# Patient Record
Sex: Male | Born: 1940
Health system: Southern US, Community
[De-identification: ages and names within clinical notes are randomized; demographics above are authoritative.]

## PROBLEM LIST (undated history)

## (undated) DIAGNOSIS — G459 Transient cerebral ischemic attack, unspecified: Secondary | ICD-10-CM

## (undated) DIAGNOSIS — I639 Cerebral infarction, unspecified: Secondary | ICD-10-CM

## (undated) DIAGNOSIS — I4891 Unspecified atrial fibrillation: Secondary | ICD-10-CM

## (undated) DIAGNOSIS — M1990 Unspecified osteoarthritis, unspecified site: Secondary | ICD-10-CM

## (undated) DIAGNOSIS — I10 Essential (primary) hypertension: Secondary | ICD-10-CM

## (undated) DIAGNOSIS — I509 Heart failure, unspecified: Secondary | ICD-10-CM

## (undated) DIAGNOSIS — H547 Unspecified visual loss: Secondary | ICD-10-CM

## (undated) DIAGNOSIS — R7881 Bacteremia: Secondary | ICD-10-CM

## (undated) DIAGNOSIS — I6529 Occlusion and stenosis of unspecified carotid artery: Secondary | ICD-10-CM

## (undated) DIAGNOSIS — A419 Sepsis, unspecified organism: Secondary | ICD-10-CM

## (undated) DIAGNOSIS — N179 Acute kidney failure, unspecified: Secondary | ICD-10-CM

## (undated) DIAGNOSIS — E785 Hyperlipidemia, unspecified: Secondary | ICD-10-CM

## (undated) DIAGNOSIS — R3915 Urgency of urination: Secondary | ICD-10-CM

## (undated) DIAGNOSIS — E119 Type 2 diabetes mellitus without complications: Secondary | ICD-10-CM

## (undated) DIAGNOSIS — H269 Unspecified cataract: Secondary | ICD-10-CM

## (undated) DIAGNOSIS — F419 Anxiety disorder, unspecified: Secondary | ICD-10-CM

## (undated) DIAGNOSIS — T7840XA Allergy, unspecified, initial encounter: Secondary | ICD-10-CM

## (undated) HISTORY — DX: Occlusion and stenosis of unspecified carotid artery: I65.29

## (undated) HISTORY — DX: Cerebral infarction, unspecified: I63.9

## (undated) HISTORY — DX: Bacteremia: R78.81

## (undated) HISTORY — PX: AORTIC VALVE REPLACEMENT: SHX41

## (undated) HISTORY — DX: Essential (primary) hypertension: I10

## (undated) HISTORY — DX: Transient cerebral ischemic attack, unspecified: G45.9

## (undated) HISTORY — DX: Allergy, unspecified, initial encounter: T78.40XA

## (undated) HISTORY — DX: Unspecified visual loss: H54.7

## (undated) HISTORY — DX: Type 2 diabetes mellitus without complications: E11.9

## (undated) HISTORY — DX: Acute kidney failure, unspecified: N17.9

## (undated) HISTORY — DX: Sepsis, unspecified organism: A41.9

## (undated) HISTORY — DX: Heart failure, unspecified: I50.9

## (undated) HISTORY — DX: Unspecified cataract: H26.9

## (undated) HISTORY — DX: Hyperlipidemia, unspecified: E78.5

## (undated) HISTORY — DX: Unspecified atrial fibrillation: I48.91

## (undated) HISTORY — DX: Anxiety disorder, unspecified: F41.9

## (undated) HISTORY — PX: BACK SURGERY: SHX140

## (undated) HISTORY — PX: EYE SURGERY: SHX253

---

## 2008-07-20 HISTORY — PX: CARDIAC VALVE REPLACEMENT: SHX585

## 2008-10-26 ENCOUNTER — Inpatient Hospital Stay (HOSPITAL_COMMUNITY): Admission: EM | Admit: 2008-10-26 | Discharge: 2008-11-05 | Payer: Self-pay | Admitting: Emergency Medicine

## 2008-10-26 ENCOUNTER — Encounter (INDEPENDENT_AMBULATORY_CARE_PROVIDER_SITE_OTHER): Payer: Self-pay | Admitting: Interventional Cardiology

## 2008-10-26 ENCOUNTER — Ambulatory Visit: Payer: Self-pay | Admitting: Surgery

## 2008-11-01 ENCOUNTER — Encounter: Payer: Self-pay | Admitting: Surgery

## 2008-11-01 HISTORY — PX: CARDIAC VALVE REPLACEMENT: SHX585

## 2008-11-27 ENCOUNTER — Ambulatory Visit: Payer: Self-pay | Admitting: Surgery

## 2008-11-27 ENCOUNTER — Encounter: Admission: RE | Admit: 2008-11-27 | Discharge: 2008-11-27 | Payer: Self-pay | Admitting: Surgery

## 2010-10-04 ENCOUNTER — Encounter: Payer: Self-pay | Admitting: Nurse Practitioner

## 2010-10-04 DIAGNOSIS — E119 Type 2 diabetes mellitus without complications: Secondary | ICD-10-CM

## 2010-10-04 DIAGNOSIS — I2510 Atherosclerotic heart disease of native coronary artery without angina pectoris: Secondary | ICD-10-CM

## 2010-10-29 LAB — BASIC METABOLIC PANEL
BUN: 25 mg/dL — ABNORMAL HIGH (ref 6–23)
BUN: 18 mg/dL (ref 6–23)
CO2: 28 mEq/L (ref 19–32)
CO2: 29 mEq/L (ref 19–32)
CO2: 30 mEq/L (ref 19–32)
Calcium: 9.3 mg/dL (ref 8.4–10.5)
Calcium: 9.3 mg/dL (ref 8.4–10.5)
Chloride: 102 mEq/L (ref 96–112)
Chloride: 106 mEq/L (ref 96–112)
Chloride: 106 mEq/L (ref 96–112)
Chloride: 102 mEq/L (ref 96–112)
Chloride: 98 mEq/L (ref 96–112)
Chloride: 99 mEq/L (ref 96–112)
Creatinine, Ser: 1.04 mg/dL (ref 0.4–1.5)
Creatinine, Ser: 0.92 mg/dL (ref 0.4–1.5)
Creatinine, Ser: 0.98 mg/dL (ref 0.4–1.5)
Creatinine, Ser: 1.01 mg/dL (ref 0.4–1.5)
GFR calc Af Amer: >60 mL/min (ref >60)
GFR calc Af Amer: >60 mL/min (ref >60)
GFR calc Af Amer: >60 mL/min (ref >60)
GFR calc Af Amer: >60 mL/min (ref >60)
GFR calc non Af Amer: >60 mL/min (ref >60)
GFR calc non Af Amer: >60 mL/min (ref >60)
GFR calc non Af Amer: >60 mL/min (ref >60)
Glucose, Bld: 123 mg/dL — ABNORMAL HIGH (ref 70–99)
Glucose, Bld: 137 mg/dL — ABNORMAL HIGH (ref 70–99)
Potassium: 4.6 mEq/L (ref 3.5–5.1)
Potassium: 4.6 mEq/L (ref 3.5–5.1)
Sodium: 135 mEq/L (ref 135–145)
Sodium: 140 mEq/L (ref 135–145)
Sodium: 137 mEq/L (ref 135–145)

## 2010-10-29 LAB — CBC
HCT: 29.5 % — ABNORMAL LOW (ref 39.0–52.0)
HCT: 29.6 % — ABNORMAL LOW (ref 39.0–52.0)
HCT: 34 % — ABNORMAL LOW (ref 39.0–52.0)
HCT: 37.3 % — ABNORMAL LOW (ref 39.0–52.0)
Hemoglobin: 11 g/dL — ABNORMAL LOW (ref 13.0–17.0)
Hemoglobin: 13.1 g/dL (ref 13.0–17.0)
Hemoglobin: 13.3 g/dL (ref 13.0–17.0)
MCHC: 31.5 g/dL (ref 30.0–36.0)
MCHC: 32.3 g/dL (ref 30.0–36.0)
MCHC: 32.9 g/dL (ref 30.0–36.0)
MCV: 74.8 fL — ABNORMAL LOW (ref 78.0–100.0)
MCV: 75.1 fL — ABNORMAL LOW (ref 78.0–100.0)
MCV: 75.6 fL — ABNORMAL LOW (ref 78.0–100.0)
MCV: 75.1 fL — ABNORMAL LOW (ref 78.0–100.0)
MCV: 75.8 fL — ABNORMAL LOW (ref 78.0–100.0)
Platelets: 130 10*3/uL — ABNORMAL LOW (ref 150–400)
Platelets: 134 10*3/uL — ABNORMAL LOW (ref 150–400)
Platelets: 139 10*3/uL — ABNORMAL LOW (ref 150–400)
Platelets: 198 10*3/uL (ref 150–400)
RBC: 3.87 MIL/uL — ABNORMAL LOW (ref 4.22–5.81)
RBC: 3.92 MIL/uL — ABNORMAL LOW (ref 4.22–5.81)
RBC: 3.96 MIL/uL — ABNORMAL LOW (ref 4.22–5.81)
RBC: 4.5 MIL/uL (ref 4.22–5.81)
RBC: 4.92 MIL/uL (ref 4.22–5.81)
RBC: 5.35 MIL/uL (ref 4.22–5.81)
RBC: 5.49 MIL/uL (ref 4.22–5.81)
WBC: 10.4 10*3/uL (ref 4.0–10.5)
WBC: 8.3 10*3/uL (ref 4.0–10.5)
WBC: 9.4 10*3/uL (ref 4.0–10.5)
WBC: 10.4 10*3/uL (ref 4.0–10.5)
WBC: 12.7 10*3/uL — ABNORMAL HIGH (ref 4.0–10.5)
WBC: 14.1 10*3/uL — ABNORMAL HIGH (ref 4.0–10.5)
WBC: 8 10*3/uL (ref 4.0–10.5)
WBC: 9.9 10*3/uL (ref 4.0–10.5)

## 2010-10-29 LAB — URINALYSIS, ROUTINE W REFLEX MICROSCOPIC
Glucose, UA: NEGATIVE mg/dL
Hgb urine dipstick: NEGATIVE
Ketones, ur: 15 mg/dL — AB
Nitrite: NEGATIVE
Specific Gravity, Urine: 1.028 (ref 1.005–1.030)
pH: 6 (ref 5.0–8.0)

## 2010-10-29 LAB — COMPREHENSIVE METABOLIC PANEL
AST: 24 U/L (ref 0–37)
AST: 51 U/L — ABNORMAL HIGH (ref 0–37)
Albumin: 2.8 g/dL — ABNORMAL LOW (ref 3.5–5.2)
Albumin: 4 g/dL (ref 3.5–5.2)
Alkaline Phosphatase: 69 U/L (ref 39–117)
Alkaline Phosphatase: 76 U/L (ref 39–117)
BUN: 20 mg/dL (ref 6–23)
Chloride: 102 mEq/L (ref 96–112)
Chloride: 105 mEq/L (ref 96–112)
Creatinine, Ser: 0.92 mg/dL (ref 0.4–1.5)
GFR calc Af Amer: >60 mL/min (ref >60)
GFR calc Af Amer: >60 mL/min (ref >60)
Potassium: 3.5 mEq/L (ref 3.5–5.1)
Potassium: 4.2 mEq/L (ref 3.5–5.1)
Sodium: 139 mEq/L (ref 135–145)
Total Bilirubin: 1.3 mg/dL — ABNORMAL HIGH (ref 0.3–1.2)
Total Protein: 5.9 g/dL — ABNORMAL LOW (ref 6.0–8.3)

## 2010-10-29 LAB — GLUCOSE, CAPILLARY
Glucose-Capillary: 125 mg/dL — ABNORMAL HIGH (ref 70–99)
Glucose-Capillary: 143 mg/dL — ABNORMAL HIGH (ref 70–99)
Glucose-Capillary: 229 mg/dL — ABNORMAL HIGH (ref 70–99)
Glucose-Capillary: 130 mg/dL — ABNORMAL HIGH (ref 70–99)
Glucose-Capillary: 149 mg/dL — ABNORMAL HIGH (ref 70–99)
Glucose-Capillary: 202 mg/dL — ABNORMAL HIGH (ref 70–99)
Glucose-Capillary: 94 mg/dL (ref 70–99)

## 2010-10-29 LAB — POCT I-STAT 3, ART BLOOD GAS (G3+)
Bicarbonate: 22.9 mEq/L (ref 20.0–24.0)
Bicarbonate: 27.8 mEq/L — ABNORMAL HIGH (ref 20.0–24.0)
O2 Saturation: 95 %
O2 Saturation: 99 %
TCO2: 24 mmol/L (ref 0–100)
TCO2: 24 mmol/L (ref 0–100)
TCO2: 26 mmol/L (ref 0–100)
TCO2: 29 mmol/L (ref 0–100)
pCO2 arterial: 35.3 mmHg (ref 35.0–45.0)
pCO2 arterial: 36 mmHg (ref 35.0–45.0)
pCO2 arterial: 37.5 mmHg (ref 35.0–45.0)
pH, Arterial: 7.443 (ref 7.350–7.450)
pH, Arterial: 7.456 — ABNORMAL HIGH (ref 7.350–7.450)
pH, Arterial: 7.504 — ABNORMAL HIGH (ref 7.350–7.450)
pO2, Arterial: 137 mmHg — ABNORMAL HIGH (ref 80.0–100.0)

## 2010-10-29 LAB — CARDIAC PANEL(CRET KIN+CKTOT+MB+TROPI)
CK, MB: 1.2 ng/mL (ref 0.3–4.0)
CK, MB: 1.8 ng/mL (ref 0.3–4.0)
Relative Index: INVALID (ref 0.0–2.5)
Total CK: 104 U/L (ref 7–232)
Total CK: 51 U/L (ref 7–232)
Troponin I: 0.01 ng/mL (ref 0.00–0.06)

## 2010-10-29 LAB — PROTIME-INR: Prothrombin Time: 18.6 seconds — ABNORMAL HIGH (ref 11.6–15.2)

## 2010-10-29 LAB — TYPE AND SCREEN
Antibody Screen: POSITIVE
Donor AG Type: NEGATIVE

## 2010-10-29 LAB — POCT I-STAT 4, (NA,K, GLUC, HGB,HCT)
Glucose, Bld: 127 mg/dL — ABNORMAL HIGH (ref 70–99)
Glucose, Bld: 150 mg/dL — ABNORMAL HIGH (ref 70–99)
HCT: 26 % — ABNORMAL LOW (ref 39.0–52.0)
HCT: 26 % — ABNORMAL LOW (ref 39.0–52.0)
Hemoglobin: 13.9 g/dL (ref 13.0–17.0)
Hemoglobin: 8.8 g/dL — ABNORMAL LOW (ref 13.0–17.0)
Hemoglobin: 8.8 g/dL — ABNORMAL LOW (ref 13.0–17.0)
Potassium: 3.6 mEq/L (ref 3.5–5.1)
Potassium: 3.7 mEq/L (ref 3.5–5.1)

## 2010-10-29 LAB — CK TOTAL AND CKMB (NOT AT ARMC): Relative Index: INVALID (ref 0.0–2.5)

## 2010-10-29 LAB — APTT: aPTT: 38 seconds — ABNORMAL HIGH (ref 24–37)

## 2010-10-29 LAB — DIFFERENTIAL
Basophils Absolute: 0.1 10*3/uL (ref 0.0–0.1)
Eosinophils Relative: 1 % (ref 0–5)
Lymphocytes Relative: 12 % (ref 12–46)
Monocytes Absolute: 0.9 10*3/uL (ref 0.1–1.0)
Monocytes Relative: 9 % (ref 3–12)

## 2010-10-29 LAB — CREATININE, SERUM
GFR calc Af Amer: >60 mL/min (ref >60)
GFR calc non Af Amer: 57 mL/min — ABNORMAL LOW (ref >60)
GFR calc non Af Amer: >60 mL/min (ref >60)

## 2010-10-29 LAB — MAGNESIUM
Magnesium: 2.2 mg/dL (ref 1.5–2.5)
Magnesium: 2.5 mg/dL (ref 1.5–2.5)
Magnesium: 2.5 mg/dL (ref 1.5–2.5)
Magnesium: 2.8 mg/dL — ABNORMAL HIGH (ref 1.5–2.5)

## 2010-10-29 LAB — POCT CARDIAC MARKERS
CKMB, poc: <1 ng/mL — ABNORMAL LOW (ref 1.0–8.0)
Troponin i, poc: <0.05 ng/mL (ref 0.00–0.09)

## 2010-10-29 LAB — URINE MICROSCOPIC-ADD ON

## 2010-10-29 LAB — TSH: TSH: 1.498 u[IU]/mL (ref 0.350–4.500)

## 2010-10-29 LAB — POCT I-STAT, CHEM 8
Calcium, Ion: 1.15 mmol/L (ref 1.12–1.32)
Chloride: 106 mEq/L (ref 96–112)
Glucose, Bld: 104 mg/dL — ABNORMAL HIGH (ref 70–99)
HCT: 28 % — ABNORMAL LOW (ref 39.0–52.0)
HCT: 33 % — ABNORMAL LOW (ref 39.0–52.0)
Hemoglobin: 11.2 g/dL — ABNORMAL LOW (ref 13.0–17.0)
Hemoglobin: 9.5 g/dL — ABNORMAL LOW (ref 13.0–17.0)
Potassium: 3.2 mEq/L — ABNORMAL LOW (ref 3.5–5.1)
Potassium: 5.1 mEq/L (ref 3.5–5.1)

## 2010-10-29 LAB — HEMOGLOBIN AND HEMATOCRIT, BLOOD
HCT: 28.3 % — ABNORMAL LOW (ref 39.0–52.0)
Hemoglobin: 9.5 g/dL — ABNORMAL LOW (ref 13.0–17.0)

## 2010-10-29 LAB — POCT I-STAT GLUCOSE
Glucose, Bld: 140 mg/dL — ABNORMAL HIGH (ref 70–99)
Operator id: 238831

## 2010-10-29 LAB — TROPONIN I: Troponin I: 0.01 ng/mL (ref 0.00–0.06)

## 2010-10-29 LAB — POCT I-STAT 3, VENOUS BLOOD GAS (G3P V)
O2 Saturation: 70 %
TCO2: 26 mmol/L (ref 0–100)

## 2010-10-29 LAB — PLATELET COUNT: Platelets: 151 10*3/uL (ref 150–400)

## 2010-10-29 LAB — BRAIN NATRIURETIC PEPTIDE: Pro B Natriuretic peptide (BNP): 474 pg/mL — ABNORMAL HIGH (ref 0.0–100.0)

## 2010-12-02 NOTE — Op Note (Signed)
NAME:  ARAF, CLUGSTON NO.:  [id number]   MEDICAL RECORD NO.:  [id number]          PATIENT TYPE:  INP   LOCATION:  2305                         FACILITY:  [facility]   PHYSICIAN:  Ala Dach, M.D.DATE OF BIRTH:  04-Aug-1940   DATE OF PROCEDURE:  11/01/2008  DATE OF DISCHARGE:                               OPERATIVE REPORT   INDICATIONS FOR PROCEDURE:  Mr. Remmert is a 70 year old gentleman, a  patient of Dr. Daneen Schick who presents today with aortic stenosis for  repair or replacement by Dr. Gilford Raid.  He is brought to the holding  area under local anesthesia, routine monitors were placed.  He was then  moved to the OR for routine induction of general anesthesia after which,  the TEE probe was protected, lubricated, and passed oropharyngeally into  the stomach, then slightly withdrawn for imaging on the cardiac  structures.   PRECARDIOPULMONARY TEE EXAMINATION:  Left ventricle:  The left  ventricular chamber was seen initially in a short axis view.  There was  mild global left ventricular hypertrophy noted.  There was some mild  diminishment of overall contractile pattern appreciated with some slight  diminishment of the thickening and contractile pattern noted in this  short axis view.  All segmental wall areas, however, are thickening and  moving inward.  Long-axis view is obtained again showing satisfactory  inferior and anterior wall contractility.   Mitral valve:  Mitral valve was initially seen in the four-chamber view.  The anterior and posterior leaflets are well outlined, well visualized,  and are mildly thickened.  They appear to be functioning entirely normal  in appropriate manner.  On color Doppler examination, there was only  trace mitral regurgitant flow appreciated.  Multiple views were carried  out again only trace regurgitant flow was noted.   Left atrium:  This is a normal left atrial chamber in size and shape.  No masses are appreciated  within.   Aortic valve:  The aortic valve was initially seen in a short-axis view.  There are 3 cusps appreciated.  The leaflets were heavily calcified with  severe restriction of movement; however, there is opening some movement  in all 3 cusps.  Attempt at passing the probe for a transgastric view in  order to obtain pressure measurements were only associated with this  mild runs of V-TACH, so we stopped doing this maneuver.  In a left  ventricular outflow view of the aortic valve, we could see again the  diminution of movement of the leaflets themselves.  Color Doppler across  this area revealed a 1+ regurgitant jet appreciated in the left  ventricular outflow tract and a 3+ jet above the level of the stenotic  valve indicating moderate aortic stenosis.  Again multiple views were  obtained.   Right ventricle:  The right ventricular chamber was somewhat enlarged,  but of normal contractility.   Tricuspid valve:  Well visualized, well seen.  Structures were normal  and intact.  Trace tricuspid regurgitant flow appreciated.   Right atrium:  The right atrial chamber was well visualized easily.  No  masses are noted within.   The patient was placed on cardiopulmonary bypass.  Aortotomy was  performed.  The aortic valve was replaced with a pericardial tissue  valve.  De-airing maneuvers were carried out and the patient was  prepared for separation from cardiopulmonary bypass.   POSTCARDIOPULMONARY BYPASS TEE EXAMINATION (LIMITED EXAM):  Left  ventricular chamber is noted in a short-axis view in postbypass.  There  was significant septal wall and inferior wall hypocontractility  appreciated compared to prebypass.  There was satisfactory contractile  pattern noted in the anterior anteroseptal and anterolateral walls.  However, with time and separation of cardiopulmonary bypass, the  inferior wall regain its contractile pattern.  The septal wall improved  in its contractility and  overall I was as previously described in the  prebypass period.   Aortic valve now seen in place and the diseased aortic valve could be  seen and well seated.  Pericardial tissue valve noted the 3 struts, we  tried the perforated valve were well shown.  The leaflet edges are well  seen.  They opened appropriately during systolic ejection and closed  satisfactorily.  Long-axis views again reveal no regurgitant flow  whatsoever and no obstruction to flow during systolic contraction,  appears to be completely satisfactory replacement of the aortic valve.   Rest of the cardiac examination was as previously described and the  patient was returned to the cardiac intensive care unit in stable  condition.           ______________________________  Ala Dach, M.D.     JTM/MEDQ  D:  11/01/2008  T:  11/02/2008  Job:  [id number]

## 2010-12-02 NOTE — Cardiovascular Report (Signed)
NAME:  Evan Stanley, Evan Stanley NO.:  [id number]   MEDICAL RECORD NO.:  [id number]          PATIENT TYPE:  INP   LOCATION:  4496                         FACILITY:  [facility]   PHYSICIAN:  Belva Crome, M.D.   DATE OF BIRTH:  01-Mar-1941   DATE OF PROCEDURE:  10/29/2008  DATE OF DISCHARGE:                            CARDIAC CATHETERIZATION   REASON FOR THE PROCEDURE:  Congestive heart failure, secondary to severe  aortic stenosis.  The study is being done to document pulmonary artery  pressures and to rule out coronary artery disease prior to valve  replacement.   PROCEDURE PERFORMED:  1. Left heart catheterization.  2. Right heart catheterization.  3. Coronary angiography.  4. Left ventriculography by hand injection.   DESCRIPTION:  After informed consent a 6-French sheath was placed in the  right femoral artery using the modified Seldinger technique.  A 6-French  A2 multipurpose catheter was used for left heart catheterization.  Hemodynamic recordings and selective left and right coronary  angiography.  Left coronary angiography was also performed using a #4 6-  [place] and a 3.5 6-French left Judkins catheter.   A Swan-Ganz catheter was used for right heart hemodynamic recordings,  oximetry samples, and thermodilution cardiac output recordings.  An LV  and pulmonary wedge simultaneous recording was performed.  During left  heart catheterization, the multipurpose catheter was used across the  aortic valve with a straight flexible-tip wire.  We were able to cross  into the left ventricle with only minimal difficulty.  Pullback pressure  was recorded with a multipurpose.   Hemostasis was achieved with manual compression.   RESULTS:  1. Hemodynamic data:  1A.  Right atrial mean pressure 1 mmHg.  1B.  Left ventricular pressure 150/40 mmHg.  1C.  Pulmonary artery pressure 50/12 mmHg.  1D.  Pulmonary capillary wedge mean 24 mmHg.  1E.  Aortic pressure 164/80.  70F.  Left  ventricular pressure 212/23 mmHg.  1G.  Aortic valve gradient peak to peak 48 mmHg.  1H.  Cardiac output by Fick 5.6 L/min and thermodilution 5.16 L/min.  1I.  Aortic valve area using the Fick cardiac output 0.9 cm2.  1. Left ventriculography:  Left ventricular cavity size is normal.  LV      function is normal.  EF is 55%.  No mitral regurgitation is noted.  2. Coronary angiography:  3A.  Left main:  Left main was short, but widely patent.  3B.  Left anterior descending coronary artery:  The large vessel that  wraps around the left ventricular apex.  The midvessel contains moderate  obstruction with less than 50% narrowing.  3C.  Circumflex coronary artery:  The circumflex coronary artery is a  large vessel that gives origin to 3 obtuse marginal branches with 50%  midvessel stenosis and 50-70% stenosis in the third obtuse margin.  3D.  Right coronary artery:  There is eccentric 40-50% narrowing in the  midvessel.  The distal vessel gives origin to PDA of the left ventricle  branch.   CONCLUSION:  1. Severe aortic stenosis with an aortic valve area between  0.88 and      1.0 cm2 depending upon the cardiac output used.  It was felt that      the patient has a calcific and possibly bicuspid aortic valve.  2. Moderately severe pulmonary hypertension with pulmonary artery      pressure of 50/12 mmHg.  3. Low normal left ventricular function with an ejection fraction of      50-55%.  4. Mild-to-moderate mid- left anterior descending and mid-to-distal      circumflex.  5. Calcified ascending aorta.   RECOMMENDATIONS:  Consideration for aortic valve replacement and a  possible coronary artery bypass grafting by Dr. Cyndia Bent.      Belva Crome, M.D.  Electronically Signed     HWS/MEDQ  D:  10/29/2008  T:  10/30/2008  Job:  [id number]   cc:   [facility]  Gilford Raid, M.D.

## 2010-12-02 NOTE — Discharge Summary (Signed)
NAME:  KOSEI, RHODES NO.:  [id number]   MEDICAL RECORD NO.:  [id number]          PATIENT TYPE:  INP   LOCATION:  2024                         FACILITY:  [facility]   PHYSICIAN:  Gilford Raid, M.D.     DATE OF BIRTH:  June 26, 1941   DATE OF ADMISSION:  10/26/2008  DATE OF DISCHARGE:  11/05/2008                               DISCHARGE SUMMARY   HISTORY:  The patient is a 70 year old gentleman with a history of  uncontrolled hypertension as well as a long history of a heart murmur  dating back to the 1960s, which was noted on physical exam when he was  drafted for the [place] [employer].  He was told he could not be drafted due to  the heart murmur.  This was never followed up.  He now reports about a 1-  year history of some exertional shortness of breath as well as fatigue.  Over the past month, this somewhat has progressed and he has developed  some orthopnea as well as PND and worsening of the exertional dyspnea  and fatigue.  He has also noticed some edema in his legs and some  abdominal fullness.  He said recently he has played golf daily for  approximately 7 days and has also been able to mow his grass on a riding  lawn mower.  He was pushing a limb out of the way and noticed some pain  in his left chest, which he felt was probably due to a pulled muscle.  He presented to his primary care physician because the pain persisted  and he was noted to have a systolic blood pressure of 190, and he was  sent to the emergency department.  His cardiac enzymes were negative.  His BNP was 474.  Electrocardiogram reveals some T-wave inversions in  the inferolateral leads and questionable J-point elevation in the  anterior leads.  This was a poor quality EKG.  An echocardiogram showed  severe aortic stenosis with an aortic valve area of 1.06 cm2 and a mean  gradient of 23 with a peak gradient of 42.  Left ventricular function  was normal with an ejection fraction of 60%.  There was  markedly  increased left ventricular wall thickness with findings consistent with  severe diastolic dysfunction.  There was also moderate aortic  insufficiency and mild mitral regurgitation.  Right ventricular function  was normal.  He was admitted through Dr. Thompson Caul service for further  evaluation and treatment to include cardiac catheterization.  He  responded to an initial course of diuresis.  On October 29, 2008, he was  taken to the Cardiac Catheterization Lab where he was found to have  normal left ventricular function with an elevated left ventricular end-  diastolic pressure.  There was severe aortic stenosis.  There was  moderate mid and distal circumflex coronary artery disease and mild-to-  moderate mid LAD coronary artery disease.  There was moderate pulmonary  hypertension.  For full details, please see the catheterization report.  Due to these findings, surgical consultation was obtained with Gilford Raid, MD, who evaluated  the patient and studies agreed with  recommendations to proceed with aortic valve replacement.   ALLERGIES:  None.   PAST MEDICAL HISTORY:  Uncontrolled hypertension.   PAST SURGICAL HISTORY:  Back surgery in 1980s.  He is also status post  right carpal tunnel surgery.   MEDICATIONS:  Prior to admission none.   SOCIAL HISTORY:  He is retired.  He lives in [place] with his wife.  He  is a remote smoker.  He denies alcohol use.   FAMILY HISTORY:  His mother died of coronary artery disease at age 26.  Father died of a stroke at age 32.  He has a brother who had aortic  valve replacement in the past for aortic stenosis.   PHYSICAL EXAMINATION:  Please see the history and physical done at the  time of admission.   PROCEDURE:  On November 01, 2008, he was taken to the operating room where  he underwent the following procedure, aortic valve replacement with a 21-  mm [employer] Ease tissue valve.  He tolerated the procedure well and  was taken to the  surgical intensive care unit in stable condition.   POSTOPERATIVE HOSPITAL COURSE:  The patient has progressed nicely.  He  has remained neurologically intact.  He has remained hemodynamically  stable.  All routine labs, monitors, drainage devices were discontinued  in the standard fashion.  He has had some moderate volume overload and  we require further diuresis as an outpatient for a short term.  He has  maintained sinus rhythm, although he did have 1 episode of a  supraventricular tachycardia at the time of this dictation.  His beta-  blocker was adjusted.  He has an acute blood loss anemia with most  recent hemoglobin/hematocrit dated on November 03, 2008, at 9.5 and 29  respectively.  Electrolytes, BUN, and creatinine are within normal  limits.  He has been started on a low-dose ACE inhibitor for his chronic  hypertension.  His blood pressure is under adequate control.  His oxygen  has been weaned and he maintains good saturations on room air.  His  incision is healing well without signs of infection.  His overall status  is felt to be tentatively stable for discharge in the morning of November 05, 2008, pending morning round reevaluation.   MEDICATIONS ON DISCHARGE:  At the time of this dictation are as follows;  1. Aspirin 325 mg daily.  2. Lopressor 25 mg twice daily.  3. Lasix 40 mg daily for 7 days.  4. Potassium chloride 20 mEq daily for 7 days.  5. Oxycodone 5 mg 1 to 2 every 4-6 hours as needed for pain.  6. Lisinopril 10 mg daily.  7. He is also to resume his vitamin E, vitamin C, and vitamin B6      supplements as previously taken at home.   CONDITION ON DISCHARGE:  Stable, improving.   INSTRUCTIONS:  Instructions on medications, activity, diet, wound care,  and followup.   FOLLOWUP:  Dr. Daneen Schick in 2 weeks.  He is instructed to the doctor's  office to arrange.  Additionally, he will see Dr. Cyndia Bent in 3 weeks.  Our office will give him a call.  He will have a chest  x-ray done at  that time.   FINAL DIAGNOSIS:  Severe aortic valve stenosis status post aortic valve  replacement as described above.   OTHER DIAGNOSES:  Hypertension, diastolic dysfunction admission,  congestive heart failure, postoperative acute blood loss  anemia, and  postoperative transient supraventricular tachycardia.       John Giovanni, P.A.-C.      Gilford Raid, M.D.  Electronically Signed    WEG/MEDQ  D:  11/04/2008  T:  11/05/2008  Job:  [id number]   cc:   Gilford Raid, M.D.  Belva Crome, M.D.

## 2010-12-02 NOTE — Assessment & Plan Note (Signed)
OFFICE VISIT   Evan Stanley, Evan Stanley  DOB:  Jul 30, 1940                                        Nov 27, 2008  CHART #:  [id number]   The patient returns today for followup status post aortic valve  replacement with a 21-mm Edwards pericardial valve on November 01, 2008,  for severe aortic stenosis.  He has been walking daily without any chest  pain or shortness of breath.  He said his energy level was already much  better than it was preoperatively.  He did see Dr. [name] in the office  recently and his blood pressure medications were adjusted.   PHYSICAL EXAMINATION:  VITAL SIGNS:  Today, his blood pressure is 170/76  and his pulse is 58 and regular.  Respiratory rate is 18 and unlabored.  Oxygen saturation on room air is 99%.  GENERAL:  He looks well.  CARDIAC:  Regular rate and rhythm with normal valve sounds.  There is a  soft systolic flow murmur across the aortic valve prosthesis.  There is  no diastolic murmur.  LUNGS:  Clear.  CHEST:  Incision is healing well and sternum is stable.  EXTREMITIES:  There is no peripheral edema.   Followup chest x-ray today shows clear lung fields and no pleural  effusions.   IMPRESSION:  Overall, the patient is recovering well following his  surgery.  He does have hypertension which is being managed by Dr. [name].  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  He will continue to follow up with Dr. [name] and  will contact me if he develops any problems with his incision.   Gilford Raid, M.D.  Electronically Signed   BB/MEDQ  D:  11/27/2008  T:  11/28/2008  Job:  [id number]   cc:   Belva Crome, M.D.

## 2010-12-02 NOTE — Op Note (Signed)
NAME:  MERIT, MAYBEE                ACCOUNT NO.:  [id number]   MEDICAL RECORD NO.:  [id number]          PATIENT TYPE:  INP   LOCATION:  2305                         FACILITY:  [facility]   PHYSICIAN:  Gilford Raid, M.D.     DATE OF BIRTH:  1940-09-03   DATE OF PROCEDURE:  11/01/2008  DATE OF DISCHARGE:                               OPERATIVE REPORT   PREOPERATIVE DIAGNOSIS:  Severe aortic stenosis.   POSTOPERATIVE DIAGNOSIS:  Severe aortic stenosis.   OPERATIVE PROCEDURE:  Aortic valve replacement using a 21-mm Edwards  Pericardial Magna - Ease valve.   SURGEON:  Gilford Raid, MD   ASSISTANT:  Suzzanne Cloud, PA-C   ANESTHESIA:  General endotracheal.   CLINICAL HISTORY:  This patient is a 70 year old gentleman with history  of uncontrolled hypertension as well as a long history of heart murmur  who presents with a 1-year history of exertional dyspnea and fatigue,  improved with rest.  The has progressed to the point where he has PND  and orthopnea as well as lower extremity edema and abdominal fullness  over the past month.  He presented to his primary physician after  developing some left-sided chest pain while mowing his yard.  He felt  this was most likely a pulled muscle.  His systolic blood pressure was  190 when he saw his primary physician and he was sent to the emergency  room.  Electrocardiogram showed no acute changes.  His cardiac enzymes  were negative.  The BNP was 474.  He underwent an echocardiogram which  showed the aortic valve area of 1.06 cm2 with a peak gradient of 42 and  a mean gradient of 23.  Ejection fraction was 60% with markedly  increased left ventricular wall thickness.  There was a finding of  severe diastolic dysfunction.  There was mild-to-moderate aortic  insufficiency and mild mitral regurgitation.  The right ventricle was  normal.  He subsequently underwent cardiac catheterization on October 29, 2008, which showed mild-to-moderate coronary artery  disease without  significant stenosis.  There was some pulmonary hypertension with PA  pressure of 50/12 and a wedge pressure of 24.  His peak-to-peak aortic  valve gradient was measured at 46 with an valve area of 0.83 cm2.  Ejection fraction was 50-60%.  After review of the catheterization and  echocardiogram, it was felt that aortic valve replacement would be the  best treatment.  His coronary stenoses were not significant and does not  require bypass.  I discussed the operative procedure of aortic valve  replacement with him including choices for valve prosthesis.  We  discussed pros and cons of mechanical versus tissue valves.  He was  adamant and he did not want to be on Coumadin and wanted tissue valve.  Given his age of 80 years, I felt this was a reasonable choice.  I  discussed the risk of structural valve deterioration in the future  possibly requiring further intervention.  We also discussed the benefits  and risks of surgery including but not limited to bleeding, blood  transfusion, infection, stroke, myocardial infarction, organ  failure,  and death.  He understood and agreed to proceed.   OPERATIVE PROCEDURE:  The patient was taken operating room and placed on  table in the supine position.  After induction of general endotracheal  anesthesia, a Foley catheter was placed in bladder using sterile  technique.  Preoperative intravenous antibiotics were given.  Then, the  chest, abdomen, and both lower extremities were prepped and draped in  usual sterile manner.  Transesophageal echocardiogram was performed by  Dr. Rica Koyanagi and showed severe calcific aortic stenosis.  This was  a trileaflet valve.  There was mild aortic insufficiency.  There was  mild mitral regurgitation.  There was mild-to-moderate left ventricular  hypertrophy which did not look as severe as it did on the transthoracic  echocardiogram.  Left ventricular function appeared well preserved.  He  did have  pulmonary hypertension with PAD in the low 20s.   Then, the chest opened through a median sternotomy incision.  The  pericardium was opened in midline.  Examination of the heart showed good  ventricular contractility.  The ascending aorta was of normal size and  had no palpable plaques in it.   Then, the patient was heparinized.  When an adequate activated clotting  time was achieved, the distal ascending aorta was cannulated using a 20-  [place] aortic cannula for arterial inflow.  Venous outflow was achieved  using a 2-stage venous cannula through the right atrial appendage.  An  antegrade cardioplegia and vent cannula were inserted in the aortic  root.   The patient was placed on cardiopulmonary bypass and distal coronaries  identified.  There were some segmental plaques in the coronary arteries.  Then, a left ventricular vent was placed thorough right superior  pulmonary vein.  A retrograde cardioplegia cannula was placed through  the right atrium in the coronary sinus.  Then, the aorta was cross-  clamped and 500 mL of cold blood antegrade cardioplegia was administered  in the aortic root with quick arrest of the heart.  Systemic hypothermia  to 28 degrees centigrade and topical hypothermia with iced saline was  used.  A temperature probe was placed in septum and an insulating pad in  the pericardium.  Additional doses of retrograde cardioplegia were given  about 20-minute intervals throughout the case to maintain myocardial  temperature around 10 degrees centigrade.   Then, the aorta was opened transversely about 1 cm above the sinotubular  junction.  Examination of the native valve showed that there were 3  leaflets that were very thickened, calcified, and poorly mobile.  There  was moderate annular calcification.  The native valve leaflets were  excised.  Rongeurs were used to decalcify the annulus.  Then, the left  ventricle and ascending aorta were copiously irrigated with  iced saline  solution.  There was no particulate debris.  The annulus was sized and a  21-mm Edwards pericardial tissue valve was chosen.  This had model  number [id number], serial number [id number].  Then, a series of pledgeted 2-0  Ethibond horizontal mattress sutures were placed around the annulus with  pledgets in a subannular position.  The sutures were then placed through  the sewing ring and the valve lowered into place.  Sutures were tied  sequentially.  The valve seated nicely.  The coronary ostia were  identified and were not obstructed.  The patient was rewarmed to 37  degrees centigrade.  The aortotomy was closed in 2 layers using  continuous 4-0 Prolene suture.  Then, the left side heart was de-aired.  The head was placed in Trendelenburg position.  Cross-clamp removed with  time of 63 minutes.  There was spontaneous return of sinus rhythm.  The  aortotomy appeared hemostatic.  Two temporary right ventricular and  right atrial pacing wires were placed and brought out through the skin.   When the patient rewarmed to 37 degrees centigrade, he was weaned from  cardiopulmonary bypass on no inotropic agents.  Total bypass time was 86  minutes.  Cardiac function appeared excellent.  Pulmonary artery  pressure had decreased PAD of around 16.  Transesophageal echocardiogram  was again performed.  This showed normal functioning aortic valve  prosthesis.  There was no aortic insufficiency or perivalvular leak.  There was mild mitral regurgitation as noted preoperatively.  Left  ventricular function appeared well preserved.  Protamine was then given  and the venous and aortic cannulas were removed without difficulty.  Hemostasis was achieved.  Two chest tubes were placed, one tube in the  posterior pericardium and one in anterior mediastinum.  The sternum was  then closed with #6 stainless steel wires.  The fascia was closed with  continuous #1 Vicryl suture.  Subcutaneous tissue was closed  with  continuous 2-0 Vicryl, and the skin with 3-0 Vicryl subcuticular  closure.  Lower extremity vein harvest  site was closed in layers in similar manner.  Sponge, needle, and  instrument counts were correct according to the scrub nurse.  Dry  sterile dressings were applied over the incisions around chest tubes  with Pleur-Evac suction.  The patient remained hemodynamically stable  and transported to the SICU in  guarded, but stable condition.      Gilford Raid, M.D.  Electronically Signed     BB/MEDQ  D:  11/01/2008  T:  11/02/2008  Job:  [id number]   cc:   Belva Crome, M.D.

## 2010-12-02 NOTE — Consult Note (Signed)
NAME:  Evan Stanley, Evan Stanley NO.:  [id number]   MEDICAL RECORD NO.:  [id number]          PATIENT TYPE:  INP   LOCATION:  57                         FACILITY:  [facility]   PHYSICIAN:  Gilford Raid, M.D.     DATE OF BIRTH:  06/15/1941   DATE OF CONSULTATION:  10/28/2008  DATE OF DISCHARGE:                                 CONSULTATION   REFERRING PHYSICIAN:  Belva Crome III, MD   REASON FOR CONSULTATION:  Severe aortic stenosis and mild to moderate  aortic insufficiency with class III congestive heart failure symptoms.   CLINICAL HISTORY:  I was asked by Dr. [name] to evaluate Evan Stanley for  consideration of aortic valve replacement.  He is a 71 year old  gentleman with history of uncontrolled hypertension, who has a long  history of heart murmur dating back to 49s when it was noted on  physical examination when he has been drafted for the [place] war.  He  was told that he would not be drafted due to the heart murmur.  This has  never been followed up.  He now reports about 1-year history of some  exertional shortness of breath and fatigue.  Over the past month or so,  he has developed some orthopnea and PND as well as worsening exertional  dyspnea and fatigue.  He has also noticed some edema in his legs and  some abdominal fullness.  He said that recently he play golf daily for  about 7 days and then mowed his grass on a riding lawn mower.  He was  pushing a limb out of the way and noticed some pain in his left chest,  which he thought was probably due to a pulled muscle.  He presented to  his primary physician because the pain persisted and was noted to have a  systolic blood pressure of 190 and was sent to the emergency room.  His  cardiac enzymes were negative.  His BNP was 474.  Electrocardiogram  showed some T-wave inversions in the inferolateral leads and  questionable J-point elevation in the anterior leads.  This is a poor  quality EKG.  He underwent  echocardiogram, which showed severe aortic  stenosis with an aortic valve area of 1.06 cm2 and a mean gradient of 23  with a peak gradient of 42.  Left ventricular function was normal with  an ejection fraction of 60%.  There is markedly increased left  ventricular wall thickness with findings consistent with severe  diastolic dysfunction.  There was mild to moderate aortic insufficiency  and mild mitral regurgitation.  Right ventricular function was normal.   REVIEW OF SYSTEMS:  As follows.  GENERAL:  He denies any fever or  chills.  He has had no recent weight changes.  He does report fatigue.  EYES:  Negative.  ENT:  Negative.  He is edentulous and wears upper  dentures.  He does not wear his lower dentures.  ENDOCRINE:  He denies  diabetes and hypothyroidism.  CARDIOVASCULAR:  As above.  He denies any  palpitations.  RESPIRATORY:  He has had  some cough that has been  nonproductive.  He denies hemoptysis.  GI:  He has had no nausea or  vomiting.  He denies melena and bright red blood per rectum.  GU:  He  denies dysuria and hematuria.  MUSCULOSKELETAL:  Denies arthralgias.  HEMATOLOGICAL:  Negative.  PSYCHIATRIC:  Negative.  NEUROLOGICAL:  He  denies any focal weakness or numbness.  He denies dizziness and syncope.  He has never had TIA or stroke.   ALLERGIES:  None.   PAST MEDICAL HISTORY:  Significant for uncontrolled hypertension.   PAST SURGICAL HISTORY:  He is status post back surgery in 38s.  He is  status post right carpal tunnel surgery.   MEDICATIONS:  None.   SOCIAL HISTORY:  He is retired.  He lives in [place] with his wife.  He  is a remote smoker.  He denies alcohol use.   FAMILY HISTORY:  His mother died of coronary artery disease at 66.  Father died of stroke at 92.  His brother had aortic valve replacement  by me in the past.  This was done for aortic stenosis.   PHYSICAL EXAMINATION:  VITAL SIGNS:  His blood pressure is 152/82, pulse  is 80 and regular, and  respiratory rate is 20 and unlabored.  GENERAL:  He is a well-developed white male in no distress.  HEENT:  Normocephalic and atraumatic.  Pupils are equal and reactive to  light and accommodation.  Extraocular muscles are intact.  His throat is  clear.  NECK:  Decreased carotid upstrokes bilaterally.  There is transmitted  murmur to both sides of his neck.  There is no adenopathy or  thyromegaly.  CARDIAC:  Regular rate and rhythm with a grade 4/6 harsh systolic murmur  heard throughout the chest and back.  There is a soft diastolic murmur  at the apex.  LUNGS:  Rales in bases.  ABDOMEN:  Active bowel sounds.  His abdomen is soft and nontender.  There are no palpable masses or organomegaly.  EXTREMITIES:  No peripheral edema.  Pedal pulses are palpable  bilaterally.  SKIN:  Warm and dry.  NEUROLOGIC:  Alert and oriented x3.  Motor and sensory exams are grossly  normal.   LABORATORY EXAMINATION:  On admission showed normal electrolytes with  BUN of 17, creatinine 0.92.  Hemoglobin is 13.3, platelet count 183,000  and white blood cell count of 10.4.  Coagulation profile was normal.   IMPRESSION:  Evan Stanley has severe aortic stenosis and severe  uncontrolled hypertension with marked left ventricular hypertrophy and  diastolic dysfunction resulting in class III congestive heart failure  symptoms.  I agree that aortic valve replacement is indicated.  I  discussed the operation with him including the alternatives for valve  replacement including mechanical and tissue valves.  We discussed the  pros and cons of both.  He said that he does not want to be on Coumadin  and wants to have tissue valve.  He is 70 years old and I discussed the  20-25% risk over the next 15-20 years that he may have structural valve  deterioration requiring further valve replacement.  He is willing to  accept that, so he does not need to be on Coumadin.  I discussed the  benefits and risks of surgery with him  including, but not limited to  bleeding, blood transfusion, infection, stroke, myocardial infarction,  ongoing heart failure due to diastolic dysfunction, and death.  He  understands and like to proceed with  surgery.  We will plan to follow up  with him after his catheterization tomorrow and decide on the time of  surgery later this week.      Gilford Raid, M.D.  Electronically Signed     BB/MEDQ  D:  10/28/2008  T:  10/29/2008  Job:  [id number]   cc:   Belva Crome, M.D.

## 2011-08-05 DIAGNOSIS — E119 Type 2 diabetes mellitus without complications: Secondary | ICD-10-CM | POA: Diagnosis not present

## 2011-09-03 DIAGNOSIS — E119 Type 2 diabetes mellitus without complications: Secondary | ICD-10-CM | POA: Diagnosis not present

## 2011-09-03 DIAGNOSIS — I10 Essential (primary) hypertension: Secondary | ICD-10-CM | POA: Diagnosis not present

## 2011-11-27 DIAGNOSIS — J309 Allergic rhinitis, unspecified: Secondary | ICD-10-CM | POA: Diagnosis not present

## 2011-12-03 DIAGNOSIS — J069 Acute upper respiratory infection, unspecified: Secondary | ICD-10-CM | POA: Diagnosis not present

## 2012-03-03 DIAGNOSIS — I259 Chronic ischemic heart disease, unspecified: Secondary | ICD-10-CM | POA: Diagnosis not present

## 2012-03-03 DIAGNOSIS — E119 Type 2 diabetes mellitus without complications: Secondary | ICD-10-CM | POA: Diagnosis not present

## 2012-05-31 DIAGNOSIS — I2510 Atherosclerotic heart disease of native coronary artery without angina pectoris: Secondary | ICD-10-CM | POA: Diagnosis not present

## 2012-05-31 DIAGNOSIS — E785 Hyperlipidemia, unspecified: Secondary | ICD-10-CM | POA: Diagnosis not present

## 2012-05-31 DIAGNOSIS — I10 Essential (primary) hypertension: Secondary | ICD-10-CM | POA: Diagnosis not present

## 2012-05-31 DIAGNOSIS — Z954 Presence of other heart-valve replacement: Secondary | ICD-10-CM | POA: Diagnosis not present

## 2012-06-06 DIAGNOSIS — E119 Type 2 diabetes mellitus without complications: Secondary | ICD-10-CM | POA: Diagnosis not present

## 2012-06-06 DIAGNOSIS — I10 Essential (primary) hypertension: Secondary | ICD-10-CM | POA: Diagnosis not present

## 2012-07-06 DIAGNOSIS — I2510 Atherosclerotic heart disease of native coronary artery without angina pectoris: Secondary | ICD-10-CM | POA: Diagnosis not present

## 2012-07-06 DIAGNOSIS — Z954 Presence of other heart-valve replacement: Secondary | ICD-10-CM | POA: Diagnosis not present

## 2012-07-06 DIAGNOSIS — I10 Essential (primary) hypertension: Secondary | ICD-10-CM | POA: Diagnosis not present

## 2012-09-06 DIAGNOSIS — I259 Chronic ischemic heart disease, unspecified: Secondary | ICD-10-CM | POA: Diagnosis not present

## 2012-09-06 DIAGNOSIS — D539 Nutritional anemia, unspecified: Secondary | ICD-10-CM | POA: Diagnosis not present

## 2013-06-14 ENCOUNTER — Encounter: Payer: Self-pay | Admitting: Family Medicine

## 2013-06-14 ENCOUNTER — Encounter (INDEPENDENT_AMBULATORY_CARE_PROVIDER_SITE_OTHER): Payer: Self-pay

## 2013-06-14 ENCOUNTER — Ambulatory Visit (INDEPENDENT_AMBULATORY_CARE_PROVIDER_SITE_OTHER): Payer: BC Managed Care – PPO | Admitting: Family Medicine

## 2013-06-14 VITALS — BP 170/75 | HR 62 | Temp 97.3°F | Ht 65.0 in | Wt 176.8 lb

## 2013-06-14 DIAGNOSIS — Z23 Encounter for immunization: Secondary | ICD-10-CM | POA: Diagnosis not present

## 2013-06-14 DIAGNOSIS — E119 Type 2 diabetes mellitus without complications: Secondary | ICD-10-CM

## 2013-06-14 LAB — POCT GLYCOSYLATED HEMOGLOBIN (HGB A1C): Hemoglobin A1C: 6.3

## 2013-06-14 MED ORDER — AMLODIPINE BESYLATE 5 MG PO TABS: 5.0000 mg | ORAL_TABLET | Freq: Every day | ORAL | Status: DC

## 2013-06-14 MED ORDER — METOPROLOL SUCCINATE ER 100 MG PO TB24: 100.0000 mg | ORAL_TABLET | Freq: Every day | ORAL | Status: DC

## 2013-06-14 MED ORDER — LISINOPRIL 40 MG PO TABS: 40.0000 mg | ORAL_TABLET | Freq: Every day | ORAL | Status: DC

## 2013-06-14 MED ORDER — METFORMIN HCL 1000 MG PO TABS: 1000.0000 mg | ORAL_TABLET | Freq: Two times a day (BID) | ORAL | Status: DC

## 2013-06-14 MED ORDER — HYDROCHLOROTHIAZIDE 25 MG PO TABS: 25.0000 mg | ORAL_TABLET | Freq: Every day | ORAL | Status: DC

## 2013-06-14 NOTE — Progress Notes (Signed)
  Subjective:    Patient ID: Evan Stanley, male    DOB: September 18, 1940, 72 y.o.   MRN: [id number]  HPI This 72 y.o. male presents for evaluation of HTN, diabetes, and he has Been having elevated blood pressure readings at home.  He states his bp Runs 840'R systolic.  He states he was told to follow up with cardiology If he continues to have elevated blood pressure readings.   Review of Systems No chest pain, SOB, HA, dizziness, vision change, N/V, diarrhea, constipation, dysuria, urinary urgency or frequency, myalgias, arthralgias or rash.     Objective:   Physical Exam  Vital signs noted  Well developed well nourished male.  HEENT - Head atraumatic Normocephalic                Eyes - PERRLA, Conjuctiva - clear Sclera- Clear EOMI                Ears - EAC's Wnl TM's Wnl Gross Hearing WNL                Nose - Nares patent                 Throat - oropharanx wnl Respiratory - Lungs CTA bilateral Cardiac - RRR S1 and S2 without murmur GI - Abdomen soft Nontender and bowel sounds active x 4 Extremities - No edema. Neuro - Grossly intact.      Assessment & Plan:  Need for prophylactic vaccination and inoculation against influenza - Plan: POCT CBC, CMP14+EGFR, Lipid panel, Thyroid Panel With TSH, PSA, total and free, POCT glycosylated hemoglobin (Hb A1C)  Diabetes - Check hgbaic, metformin 1051m bid  HTN - Discussed increasing amlodipine to 115mpo qd.  He wants to wait and discuss with cardiology.  CHF - Follow up with Cardiology.  WiLysbeth PennerNP

## 2013-06-14 NOTE — Patient Instructions (Signed)
Influenza Vaccine (Flu Vaccine, Inactivated) 2013 2014 What You Need to Know WHY GET VACCINATED?  Influenza ("flu") is a contagious disease that spreads around the [place] every winter, usually between October and May.  Flu is caused by the influenza virus, and can be spread by coughing, sneezing, and close contact.  Anyone can get flu, but the risk of getting flu is highest among children. Symptoms come on suddenly and may last several days. They can include:  Fever or chills.  Sore throat.  Muscle aches.  Fatigue.  Cough.  Headache.  Runny or stuffy nose. Flu can make some people much sicker than others. These people include young children, people 52 and older, pregnant women, and people with certain health conditions such as heart, lung or kidney disease, or a weakened immune system. Flu vaccine is especially important for these people, and anyone in close contact with them. Flu can also lead to pneumonia, and make existing medical conditions worse. It can cause diarrhea and seizures in children. Each year thousands of people in the [place] die from flu, and many more are hospitalized. Flu vaccine is the best protection we have from flu and its complications. Flu vaccine also helps prevent spreading flu from person to person. INACTIVATED FLU VACCINE There are 2 types of influenza vaccine:  You are getting an inactivated flu vaccine, which does not contain any live influenza virus. It is given by injection with a needle, and often called the "flu shot."  A different live, attenuated (weakened) influenza vaccine is sprayed into the nostrils. This vaccine is described in a separate Vaccine Information Statement. Flu vaccine is recommended every year. Children 6 months through 9 years of age should get 2 doses the first year they get vaccinated. Flu viruses are always changing. Each year's flu vaccine is made to protect from viruses that are most likely to cause disease  that year. While flu vaccine cannot prevent all cases of flu, it is our best defense against the disease. Inactivated flu vaccine protects against 3 or 4 different influenza viruses. It takes about 2 weeks for protection to develop after the vaccination, and protection lasts several months to a year. Some illnesses that are not caused by influenza virus are often mistaken for flu. Flu vaccine will not prevent these illnesses. It can only prevent influenza. A "high-dose" flu vaccine is available for people 22 years of age and older. The person giving you the vaccine can tell you more about it. Some inactivated flu vaccine contains a very small amount of a mercury-based preservative called thimerosal. Studies have shown that thimerosal in vaccines is not harmful, but flu vaccines that do not contain a preservative are available. SOME PEOPLE SHOULD NOT GET THIS VACCINE Tell the person who gives you the vaccine:  If you have any severe (life-threatening) allergies. If you ever had a life-threatening allergic reaction after a dose of flu vaccine, or have a severe allergy to any part of this vaccine, you may be advised not to get a dose. Most, but not all, types of flu vaccine contain a small amount of egg.  If you ever had Guillain Barr Syndrome (a severe paralyzing illness, also called GBS). Some people with a history of GBS should not get this vaccine. This should be discussed with your doctor.  If you are not feeling well. They might suggest waiting until you feel better. But you should come back. RISKS OF A VACCINE REACTION With a vaccine, like any medicine, there  is a chance of side effects. These are usually mild and go away on their own. Serious side effects are also possible, but are very rare. Inactivated flu vaccine does not contain live flu virus, sogetting flu from this vaccine is not possible. Brief fainting spells and related symptoms (such as jerking movements) can happen after any medical  procedure, including vaccination. Sitting or lying down for about 15 minutes after a vaccination can help prevent fainting and injuries caused by falls. Tell your doctor if you feel dizzy or lightheaded, or have vision changes or ringing in the ears. Mild problems following inactivated flu vaccine:  Soreness, redness, or swelling where the shot was given.  Hoarseness; sore, red or itchy eyes; or cough.  Fever.  Aches.  Headache.  Itching.  Fatigue. If these problems occur, they usually begin soon after the shot and last 1 or 2 days. Moderate problems following inactivated flu vaccine:  Young children who get inactivated flu vaccine and pneumococcal vaccine (PCV13) at the same time may be at increased risk for seizures caused by fever. Ask your doctor for more information. Tell your doctor if a child who is getting flu vaccine has ever had a seizure. Severe problems following inactivated flu vaccine:  A severe allergic reaction could occur after any vaccine (estimated less than 1 in a million doses).  There is a small possibility that inactivated flu vaccine could be associated with Guillan Barr Syndrome (GBS), no more than 1 or 2 cases per million people vaccinated. This is much lower than the risk of severe complications from flu, which can be prevented by flu vaccine. The safety of vaccines is always being monitored. For more information, visit: http://www.aguilar.org/ WHAT IF THERE IS A SERIOUS REACTION? What should I look for?  Look for anything that concerns you, such as signs of a severe allergic reaction, very high fever, or behavior changes. Signs of a severe allergic reaction can include hives, swelling of the face and throat, difficulty breathing, a fast heartbeat, dizziness, and weakness. These would start a few minutes to a few hours after the vaccination. What should I do?  If you think it is a severe allergic reaction or other emergency that cannot wait, call 9 1 1   or get the person to the nearest hospital. Otherwise, call your doctor.  Afterward, the reaction should be reported to the Vaccine Adverse Event Reporting System (VAERS). Your doctor might file this report, or you can do it yourself through the VAERS website at www.vaers.SamedayNews.es, or by calling 406-856-5175. VAERS is only for reporting reactions. They do not give medical advice. THE NATIONAL VACCINE INJURY COMPENSATION PROGRAM The National Vaccine Injury Compensation Program (VICP) is a federal program that was created to compensate people who may have been injured by certain vaccines. Persons who believe they may have been injured by a vaccine can learn about the program and about filing a claim by calling 713 079 8238 or visiting the [place] website at GoldCloset.com.ee [place]?  Ask your doctor.  Call your local or state health department.  Contact the Centers for Disease Control and Prevention (CDC):  Call 2367976652 (1-800-CDC-INFO) or  Visit CDC's website at https://gibson.com/ CDC Inactivated Influenza Vaccine Interim VIS (02/12/12) Document Released: 04/30/2006 Document Revised: 03/30/2012 Document Reviewed: 03/08/2012 [facility] Patient Information 2014 [place]. Diabetes and Exercise Exercising regularly is important. It is not just about losing weight. It has many health benefits, such as:  Improving your overall fitness, flexibility, and endurance.  Increasing your  bone density.  Helping with weight control.  Decreasing your body fat.  Increasing your muscle strength.  Reducing stress and tension.  Improving your overall health. People with diabetes who exercise gain additional benefits because exercise:  Reduces appetite.  Improves the body's use of blood sugar (glucose).  Helps lower or control blood glucose.  Decreases blood pressure.  Helps control blood lipids (such as cholesterol and triglycerides).  Improves the  body's use of the hormone insulin by:  Increasing the body's insulin sensitivity.  Reducing the body's insulin needs.  Decreases the risk for heart disease because exercising:  Lowers cholesterol and triglycerides levels.  Increases the levels of good cholesterol (such as high-density lipoproteins [HDL]) in the body.  Lowers blood glucose levels. YOUR ACTIVITY PLAN  Choose an activity that you enjoy and set realistic goals. Your health care provider or diabetes educator can help you make an activity plan that works for you. You can break activities into 2 or 3 sessions throughout the day. Doing so is as good as one long session. Exercise ideas include:  Taking the dog for a walk.  Taking the stairs instead of the elevator.  Dancing to your favorite song.  Doing your favorite exercise with a friend. RECOMMENDATIONS FOR EXERCISING WITH TYPE 1 OR TYPE 2 DIABETES   Check your blood glucose before exercising. If blood glucose levels are greater than 240 mg/dL, check for urine ketones. Do not exercise if ketones are present.  Avoid injecting insulin into areas of the body that are going to be exercised. For example, avoid injecting insulin into:  The arms when playing tennis.  The legs when jogging.  Keep a record of:  Food intake before and after you exercise.  Expected peak times of insulin action.  Blood glucose levels before and after you exercise.  The type and amount of exercise you have done.  Review your records with your health care provider. Your health care provider will help you to develop guidelines for adjusting food intake and insulin amounts before and after exercising.  If you take insulin or oral hypoglycemic agents, watch for signs and symptoms of hypoglycemia. They include:  Dizziness.  Shaking.  Sweating.  Chills.  Confusion.  Drink plenty of water while you exercise to prevent dehydration or heat stroke. Body water is lost during exercise and  must be replaced.  Talk to your health care provider before starting an exercise program to make sure it is safe for you. Remember, almost any type of activity is better than none. Document Released: 09/26/2003 Document Revised: 03/08/2013 Document Reviewed: 12/13/2012 [facility] Patient Information 2014 [place].

## 2013-06-15 LAB — CMP14+EGFR
ALT: 21 IU/L (ref 0–44)
AST: 15 IU/L (ref 0–40)
Albumin/Globulin Ratio: 2.8 — ABNORMAL HIGH (ref 1.1–2.5)
Albumin: 4.5 g/dL (ref 3.5–4.8)
Alkaline Phosphatase: 41 IU/L (ref 39–117)
BUN/Creatinine Ratio: 13 (ref 10–22)
BUN: 15 mg/dL (ref 8–27)
CO2: 28 mmol/L (ref 18–29)
Calcium: 9.8 mg/dL (ref 8.6–10.2)
Chloride: 99 mmol/L (ref 97–108)
Creatinine, Ser: 1.12 mg/dL (ref 0.76–1.27)
GFR calc Af Amer: 77 mL/min/{1.73_m2} (ref >59)
GFR calc non Af Amer: 66 mL/min/{1.73_m2} (ref >59)
Globulin, Total: 1.6 g/dL (ref 1.5–4.5)
Glucose: 136 mg/dL — ABNORMAL HIGH (ref 65–99)
Potassium: 4.8 mmol/L (ref 3.5–5.2)
Sodium: 142 mmol/L (ref 134–144)
Total Bilirubin: 0.5 mg/dL (ref 0.0–1.2)
Total Protein: 6.1 g/dL (ref 6.0–8.5)

## 2013-06-15 LAB — LIPID PANEL
Chol/HDL Ratio: 5.7 ratio units — ABNORMAL HIGH (ref 0.0–5.0)
Cholesterol, Total: 195 mg/dL (ref 100–199)
HDL: 34 mg/dL — ABNORMAL LOW (ref >39)
LDL Calculated: 136 mg/dL — ABNORMAL HIGH (ref 0–99)
Triglycerides: 127 mg/dL (ref 0–149)
VLDL Cholesterol Cal: 25 mg/dL (ref 5–40)

## 2013-06-15 LAB — THYROID PANEL WITH TSH
Free Thyroxine Index: 2.5 (ref 1.2–4.9)
T3 Uptake Ratio: 33 % (ref 24–39)
T4, Total: 7.7 ug/dL (ref 4.5–12.0)
TSH: 1.89 u[IU]/mL (ref 0.450–4.500)

## 2013-06-15 LAB — PSA, TOTAL AND FREE
PSA, Free Pct: 35.6 %
PSA, Free: 0.32 ng/mL
PSA: 0.9 ng/mL (ref 0.0–4.0)

## 2013-06-19 DIAGNOSIS — M6530 Trigger finger, unspecified finger: Secondary | ICD-10-CM | POA: Diagnosis not present

## 2013-06-20 ENCOUNTER — Other Ambulatory Visit: Payer: Self-pay | Admitting: Family Medicine

## 2013-06-20 MED ORDER — PRAVASTATIN SODIUM 40 MG PO TABS: 40.0000 mg | ORAL_TABLET | Freq: Every day | ORAL | Status: DC

## 2013-06-21 ENCOUNTER — Telehealth: Payer: Self-pay | Admitting: *Deleted

## 2013-06-21 NOTE — Telephone Encounter (Signed)
Message copied by Priscille Heidelberg on Wed Jun 21, 2013 11:53 AM ------      Message from: Lysbeth Penner      Created: Tue Jun 20, 2013  8:52 AM       Diabetes under control.  Lipids elevated and will rx pravachol 70m po qd. ------

## 2013-06-21 NOTE — Telephone Encounter (Signed)
Lm to call back

## 2013-07-05 ENCOUNTER — Encounter: Payer: Self-pay | Admitting: Interventional Cardiology

## 2013-07-05 ENCOUNTER — Ambulatory Visit (INDEPENDENT_AMBULATORY_CARE_PROVIDER_SITE_OTHER): Payer: Medicare Other | Admitting: Interventional Cardiology

## 2013-07-05 VITALS — BP 138/70 | HR 60 | Ht 65.0 in | Wt 177.0 lb

## 2013-07-05 DIAGNOSIS — E1159 Type 2 diabetes mellitus with other circulatory complications: Secondary | ICD-10-CM | POA: Insufficient documentation

## 2013-07-05 DIAGNOSIS — E785 Hyperlipidemia, unspecified: Secondary | ICD-10-CM | POA: Diagnosis not present

## 2013-07-05 DIAGNOSIS — Z953 Presence of xenogenic heart valve: Secondary | ICD-10-CM | POA: Insufficient documentation

## 2013-07-05 DIAGNOSIS — I2510 Atherosclerotic heart disease of native coronary artery without angina pectoris: Secondary | ICD-10-CM

## 2013-07-05 DIAGNOSIS — I10 Essential (primary) hypertension: Secondary | ICD-10-CM

## 2013-07-05 DIAGNOSIS — Z954 Presence of other heart-valve replacement: Secondary | ICD-10-CM

## 2013-07-05 DIAGNOSIS — I152 Hypertension secondary to endocrine disorders: Secondary | ICD-10-CM | POA: Insufficient documentation

## 2013-07-05 DIAGNOSIS — E1169 Type 2 diabetes mellitus with other specified complication: Secondary | ICD-10-CM | POA: Insufficient documentation

## 2013-07-05 NOTE — Patient Instructions (Signed)
Start Pravastatin that was prescribed by your pcp Dr.Moore  Follow up with Dr.Moore office in 6 weeks for fasting lipid and alt labs  Your physician wants you to follow-up in: 1 year You will receive a reminder letter in the mail two months in advance. If you don't receive a letter, please call our office to schedule the follow-up appointment.

## 2013-07-05 NOTE — Progress Notes (Signed)
Patient ID: Evan Stanley, male   DOB: 1940/10/07, 72 y.o.   MRN: [id number] Past Medical History  Aortic valve replacement with #23 mm Edwards Pericardial Magna valve, 10/2008   Hypertension   Asymptomatic CAD with 40-50 % mid LAD, distal Cfx., and mid RCA.      [place], [address], [place]  [address] Phone: [phone] Fax:  [phone]  Date:  07/05/2013   ID:  Evan Stanley, DOB [birth year], MRN [id number]  PCP:  Redge Gainer, MD   ASSESSMENT:  1. Status post aortic valve replacement with no clinical evidence of dysfunction 2. Coronary artery disease with previous CABG 3. Hypertension, controlled 4. Hyperlipidemia, refuses therapy  PLAN:  1. Start statin therapy as recommended by primary care 2. 6 weeks after starting therapy, he needs to have a fasting lipid panel and ALT. 3. Clinical followup in one year. 4. Active lifestyle   SUBJECTIVE: Evan Stanley is a 72 y.o. male who is doing well. He has no cardiac symptoms. We have recommended statin therapy in the past but he refuses. Primary care has recently readdress this issue and he purchased pravastatin but has not started it. He denies claudication. There is no angina, palpitations, syncope, or dyspnea. Overall he feels well.   Wt Readings from Last 3 Encounters:  07/05/13 177 lb (80.287 kg)  06/14/13 176 lb 12.8 oz (80.196 kg)     Past Medical History  Diagnosis Date  . Diabetes mellitus without complication   . Hyperlipidemia   . Hypertension   . CHF (congestive heart failure)   . Anxiety     Current Outpatient Prescriptions  Medication Sig Dispense Refill  . amLODipine (NORVASC) 5 MG tablet Take 1 tablet (5 mg total) by mouth daily.  90 tablet  3  . aspirin 325 MG tablet Take 325 mg by mouth daily. Take two once daily       . hydrochlorothiazide (HYDRODIURIL) 25 MG tablet Take 1 tablet (25 mg total) by mouth daily.  90 tablet  3  . lisinopril (PRINIVIL,ZESTRIL) 40 MG tablet Take 1 tablet (40  mg total) by mouth daily.  90 tablet  3  . metFORMIN (GLUCOPHAGE) 1000 MG tablet Take 1 tablet (1,000 mg total) by mouth 2 (two) times daily with a meal.  180 tablet  3  . metoprolol succinate (TOPROL XL) 100 MG 24 hr tablet Take 1 tablet (100 mg total) by mouth daily.  90 tablet  3  . pravastatin (PRAVACHOL) 40 MG tablet Take 1 tablet (40 mg total) by mouth daily.  90 tablet  3   No current facility-administered medications for this visit.    Allergies:   No Known Allergies  Social History:  The patient  reports that he has never smoked. He does not have any smokeless tobacco history on file. He reports that he does not drink alcohol or use illicit drugs.   ROS:  Please see the history of present illness.    All other systems reviewed and negative.   OBJECTIVE: VS:  BP 138/70  Pulse 60  Ht 5' 5"  (1.651 m)  Wt 177 lb (80.287 kg)  BMI 29.45 kg/m2 Well nourished, well developed, in no acute distress, healthy HEENT: normal Neck: JVD flat. Carotid bruit bilateral soft transmitted from the aortic valve  Cardiac:  normal S1, S2; RRR; 1 of 6 systolic murmur. No diastolic  murmur Lungs:  clear to auscultation bilaterally, no wheezing, rhonchi or rales Abd: soft, nontender, no hepatomegaly Ext: Edema  Absent . Pulses 2+  Skin: warm and dry Neuro:  CNs 2-12 intact, no focal abnormalities noted  EKG:  Normal sinus rhythm with tall R-wave in V1. Nonspecific ST abnormality.       Signed, Illene Labrador III, MD 07/05/2013 2:50 PM

## 2013-07-17 DIAGNOSIS — M6530 Trigger finger, unspecified finger: Secondary | ICD-10-CM | POA: Diagnosis not present

## 2013-07-25 ENCOUNTER — Encounter: Payer: Self-pay | Admitting: *Deleted

## 2013-08-16 DIAGNOSIS — H2510 Age-related nuclear cataract, unspecified eye: Secondary | ICD-10-CM | POA: Diagnosis not present

## 2013-08-16 DIAGNOSIS — H40039 Anatomical narrow angle, unspecified eye: Secondary | ICD-10-CM | POA: Diagnosis not present

## 2013-08-16 DIAGNOSIS — H4089 Other specified glaucoma: Secondary | ICD-10-CM | POA: Diagnosis not present

## 2013-08-16 DIAGNOSIS — H02839 Dermatochalasis of unspecified eye, unspecified eyelid: Secondary | ICD-10-CM | POA: Diagnosis not present

## 2013-08-17 DIAGNOSIS — E11311 Type 2 diabetes mellitus with unspecified diabetic retinopathy with macular edema: Secondary | ICD-10-CM | POA: Diagnosis not present

## 2013-08-17 DIAGNOSIS — H4089 Other specified glaucoma: Secondary | ICD-10-CM | POA: Diagnosis not present

## 2013-08-17 DIAGNOSIS — H211X9 Other vascular disorders of iris and ciliary body, unspecified eye: Secondary | ICD-10-CM | POA: Diagnosis not present

## 2013-08-17 DIAGNOSIS — H3520 Other non-diabetic proliferative retinopathy, unspecified eye: Secondary | ICD-10-CM | POA: Diagnosis not present

## 2013-08-17 DIAGNOSIS — H3560 Retinal hemorrhage, unspecified eye: Secondary | ICD-10-CM | POA: Diagnosis not present

## 2013-08-17 DIAGNOSIS — E1139 Type 2 diabetes mellitus with other diabetic ophthalmic complication: Secondary | ICD-10-CM | POA: Diagnosis not present

## 2013-08-18 DIAGNOSIS — H4089 Other specified glaucoma: Secondary | ICD-10-CM | POA: Diagnosis not present

## 2013-08-18 DIAGNOSIS — H34239 Retinal artery branch occlusion, unspecified eye: Secondary | ICD-10-CM | POA: Diagnosis not present

## 2013-08-18 DIAGNOSIS — H211X9 Other vascular disorders of iris and ciliary body, unspecified eye: Secondary | ICD-10-CM | POA: Diagnosis not present

## 2013-08-18 DIAGNOSIS — H3560 Retinal hemorrhage, unspecified eye: Secondary | ICD-10-CM | POA: Diagnosis not present

## 2013-08-18 DIAGNOSIS — H3520 Other non-diabetic proliferative retinopathy, unspecified eye: Secondary | ICD-10-CM | POA: Diagnosis not present

## 2013-08-23 DIAGNOSIS — E11359 Type 2 diabetes mellitus with proliferative diabetic retinopathy without macular edema: Secondary | ICD-10-CM | POA: Diagnosis not present

## 2013-08-23 DIAGNOSIS — H4089 Other specified glaucoma: Secondary | ICD-10-CM | POA: Diagnosis not present

## 2013-08-23 DIAGNOSIS — H409 Unspecified glaucoma: Secondary | ICD-10-CM | POA: Diagnosis not present

## 2013-08-23 DIAGNOSIS — Z9889 Other specified postprocedural states: Secondary | ICD-10-CM | POA: Diagnosis not present

## 2013-08-23 DIAGNOSIS — E1139 Type 2 diabetes mellitus with other diabetic ophthalmic complication: Secondary | ICD-10-CM | POA: Diagnosis not present

## 2013-10-05 DIAGNOSIS — H2510 Age-related nuclear cataract, unspecified eye: Secondary | ICD-10-CM | POA: Diagnosis not present

## 2013-10-05 DIAGNOSIS — H4089 Other specified glaucoma: Secondary | ICD-10-CM | POA: Diagnosis not present

## 2013-10-05 DIAGNOSIS — H2589 Other age-related cataract: Secondary | ICD-10-CM | POA: Diagnosis not present

## 2013-10-09 DIAGNOSIS — H2589 Other age-related cataract: Secondary | ICD-10-CM | POA: Diagnosis not present

## 2013-10-09 DIAGNOSIS — H2510 Age-related nuclear cataract, unspecified eye: Secondary | ICD-10-CM | POA: Diagnosis not present

## 2013-10-09 DIAGNOSIS — H25019 Cortical age-related cataract, unspecified eye: Secondary | ICD-10-CM | POA: Diagnosis not present

## 2013-10-09 DIAGNOSIS — H25049 Posterior subcapsular polar age-related cataract, unspecified eye: Secondary | ICD-10-CM | POA: Diagnosis not present

## 2013-12-12 ENCOUNTER — Encounter: Payer: Self-pay | Admitting: Family Medicine

## 2013-12-12 ENCOUNTER — Ambulatory Visit (INDEPENDENT_AMBULATORY_CARE_PROVIDER_SITE_OTHER): Payer: BC Managed Care – PPO | Admitting: Family Medicine

## 2013-12-12 VITALS — BP 138/69 | HR 66 | Temp 97.2°F | Ht 65.0 in | Wt 175.0 lb

## 2013-12-12 DIAGNOSIS — R5383 Other fatigue: Secondary | ICD-10-CM | POA: Diagnosis not present

## 2013-12-12 DIAGNOSIS — I10 Essential (primary) hypertension: Secondary | ICD-10-CM | POA: Diagnosis not present

## 2013-12-12 DIAGNOSIS — R5381 Other malaise: Secondary | ICD-10-CM

## 2013-12-12 DIAGNOSIS — E119 Type 2 diabetes mellitus without complications: Secondary | ICD-10-CM

## 2013-12-12 DIAGNOSIS — E785 Hyperlipidemia, unspecified: Secondary | ICD-10-CM

## 2013-12-12 LAB — POCT CBC
Granulocyte percent: 75 %G (ref 37–80)
HCT, POC: 46.3 % (ref 43.5–53.7)
Hemoglobin: 14.9 g/dL (ref 14.1–18.1)
Lymph, poc: 2 (ref 0.6–3.4)
MCH, POC: 28.2 pg (ref 27–31.2)
MCHC: 32.1 g/dL (ref 31.8–35.4)
MCV: 87.9 fL (ref 80–97)
MPV: 8.7 fL (ref 0–99.8)
POC Granulocyte: 6.4 (ref 2–6.9)
POC LYMPH PERCENT: 23.9 %L (ref 10–50)
Platelet Count, POC: 185 10*3/uL (ref 142–424)
RBC: 5.3 M/uL (ref 4.69–6.13)
RDW, POC: 13.2 %
WBC: 8.5 10*3/uL (ref 4.6–10.2)

## 2013-12-12 LAB — POCT GLYCOSYLATED HEMOGLOBIN (HGB A1C): Hemoglobin A1C: 6.7

## 2013-12-12 LAB — POCT UA - MICROALBUMIN: Microalbumin Ur, POC: POSITIVE mg/L

## 2013-12-12 NOTE — Progress Notes (Signed)
   Subjective:    Patient ID: Keiton Cosma, male    DOB: Sep 18, 1940, 73 y.o.   MRN: [id number]  HPI This 73 y.o. male presents for evaluation of routine follow up.  He has hx of diabetes, CAD,  Hypertension, and hyperlipidemia.  He did not want to take pravastatin for hyperlipidemia or any statins Because he worries about getting cancer.   Review of Systems    No chest pain, SOB, HA, dizziness, vision change, N/V, diarrhea, constipation, dysuria, urinary urgency or frequency, myalgias, arthralgias or rash.  Objective:   Physical Exam  Vital signs noted  Well developed well nourished male.  HEENT - Head atraumatic Normocephalic                Eyes - PERRLA, Conjuctiva - clear Sclera- Clear EOMI                Ears - EAC's Wnl TM's Wnl Gross Hearing WNL                Nose - Nares patent                 Throat - oropharanx wnl Respiratory - Lungs CTA bilateral Cardiac - RRR S1 and S2 without murmur GI - Abdomen soft Nontender and bowel sounds active x 4 Extremities - No edema. Neuro - Grossly intact.      Assessment & Plan:  Diabetes mellitus, type 2 - Plan: POCT glycosylated hemoglobin (Hb A1C), POCT UA - Microalbumin, CMP14+EGFR, Microalbumin, urine  Hypertension - Plan: POCT CBC, CMP14+EGFR  Hyperlipemia - Plan: Lipid panel, and recommend statin but refuses, recommend fish oil tablets otc.  Fatigue - Plan: POCT CBC, CMP14+EGFR  Follow up in 4 months  Lysbeth Penner FNP

## 2013-12-13 LAB — CMP14+EGFR
ALT: 28 IU/L (ref 0–44)
AST: 22 IU/L (ref 0–40)
Albumin/Globulin Ratio: 3.3 — ABNORMAL HIGH (ref 1.1–2.5)
Albumin: 4.6 g/dL (ref 3.5–4.8)
Alkaline Phosphatase: 49 IU/L (ref 39–117)
BUN/Creatinine Ratio: 15 (ref 10–22)
BUN: 19 mg/dL (ref 8–27)
CO2: 26 mmol/L (ref 18–29)
Calcium: 10.1 mg/dL (ref 8.6–10.2)
Chloride: 100 mmol/L (ref 97–108)
Creatinine, Ser: 1.25 mg/dL (ref 0.76–1.27)
GFR calc Af Amer: 67 mL/min/{1.73_m2} (ref >59)
GFR calc non Af Amer: 58 mL/min/{1.73_m2} — ABNORMAL LOW (ref >59)
Globulin, Total: 1.4 g/dL — ABNORMAL LOW (ref 1.5–4.5)
Glucose: 152 mg/dL — ABNORMAL HIGH (ref 65–99)
Potassium: 4.8 mmol/L (ref 3.5–5.2)
Sodium: 141 mmol/L (ref 134–144)
Total Bilirubin: 0.5 mg/dL (ref 0.0–1.2)
Total Protein: 6 g/dL (ref 6.0–8.5)

## 2013-12-13 LAB — LIPID PANEL
Chol/HDL Ratio: 5.9 ratio units — ABNORMAL HIGH (ref 0.0–5.0)
Cholesterol, Total: 200 mg/dL — ABNORMAL HIGH (ref 100–199)
HDL: 34 mg/dL — ABNORMAL LOW (ref >39)
LDL Calculated: 135 mg/dL — ABNORMAL HIGH (ref 0–99)
Triglycerides: 154 mg/dL — ABNORMAL HIGH (ref 0–149)
VLDL Cholesterol Cal: 31 mg/dL (ref 5–40)

## 2013-12-13 LAB — MICROALBUMIN, URINE: Microalbumin, Urine: 59 ug/mL — ABNORMAL HIGH (ref 0.0–17.0)

## 2013-12-19 ENCOUNTER — Other Ambulatory Visit: Payer: Self-pay | Admitting: Family Medicine

## 2014-02-05 DIAGNOSIS — E119 Type 2 diabetes mellitus without complications: Secondary | ICD-10-CM | POA: Diagnosis not present

## 2014-02-05 DIAGNOSIS — H34239 Retinal artery branch occlusion, unspecified eye: Secondary | ICD-10-CM | POA: Diagnosis not present

## 2014-02-05 DIAGNOSIS — H35039 Hypertensive retinopathy, unspecified eye: Secondary | ICD-10-CM | POA: Diagnosis not present

## 2014-04-16 ENCOUNTER — Ambulatory Visit (INDEPENDENT_AMBULATORY_CARE_PROVIDER_SITE_OTHER): Payer: BC Managed Care – PPO | Admitting: Family Medicine

## 2014-04-16 ENCOUNTER — Encounter: Payer: Self-pay | Admitting: Family Medicine

## 2014-04-16 VITALS — BP 141/73 | HR 63 | Temp 97.7°F | Ht 65.0 in | Wt 177.4 lb

## 2014-04-16 DIAGNOSIS — E1165 Type 2 diabetes mellitus with hyperglycemia: Secondary | ICD-10-CM

## 2014-04-16 DIAGNOSIS — I10 Essential (primary) hypertension: Secondary | ICD-10-CM | POA: Diagnosis not present

## 2014-04-16 DIAGNOSIS — Z23 Encounter for immunization: Secondary | ICD-10-CM

## 2014-04-16 DIAGNOSIS — I2510 Atherosclerotic heart disease of native coronary artery without angina pectoris: Secondary | ICD-10-CM | POA: Diagnosis not present

## 2014-04-16 DIAGNOSIS — IMO0002 Reserved for concepts with insufficient information to code with codable children: Secondary | ICD-10-CM | POA: Diagnosis not present

## 2014-04-16 DIAGNOSIS — E118 Type 2 diabetes mellitus with unspecified complications: Principal | ICD-10-CM

## 2014-04-16 LAB — POCT CBC
Granulocyte percent: 74.7 %G (ref 37–80)
HCT, POC: 45.2 % (ref 43.5–53.7)
Hemoglobin: 15 g/dL (ref 14.1–18.1)
Lymph, poc: 2.1 (ref 0.6–3.4)
MCH, POC: 27.8 pg (ref 27–31.2)
MCHC: 33.1 g/dL (ref 31.8–35.4)
MCV: 83.8 fL (ref 80–97)
MPV: 8.7 fL (ref 0–99.8)
POC Granulocyte: 6.5 (ref 2–6.9)
POC LYMPH PERCENT: 24.4 %L (ref 10–50)
Platelet Count, POC: 181 10*3/uL (ref 142–424)
RBC: 5.4 M/uL (ref 4.69–6.13)
RDW, POC: 13.2 %
WBC: 8.7 10*3/uL (ref 4.6–10.2)

## 2014-04-16 LAB — POCT GLYCOSYLATED HEMOGLOBIN (HGB A1C): Hemoglobin A1C: 6.9

## 2014-04-16 MED ORDER — HYDROCHLOROTHIAZIDE 25 MG PO TABS: 25.0000 mg | ORAL_TABLET | Freq: Every day | ORAL | Status: DC

## 2014-04-16 MED ORDER — METOPROLOL SUCCINATE ER 100 MG PO TB24: 100.0000 mg | ORAL_TABLET | Freq: Every day | ORAL | Status: DC

## 2014-04-16 MED ORDER — METFORMIN HCL 1000 MG PO TABS: 1000.0000 mg | ORAL_TABLET | Freq: Two times a day (BID) | ORAL | Status: DC

## 2014-04-16 MED ORDER — AMLODIPINE BESYLATE 5 MG PO TABS: 5.0000 mg | ORAL_TABLET | Freq: Every day | ORAL | Status: DC

## 2014-04-16 MED ORDER — LISINOPRIL 40 MG PO TABS: 40.0000 mg | ORAL_TABLET | Freq: Every day | ORAL | Status: DC

## 2014-04-16 NOTE — Progress Notes (Signed)
   Subjective:    Patient ID: Evan Stanley, male    DOB: 08-17-1940, 73 y.o.   MRN: [id number]  HPI This 73 y.o. male presents for evaluation of diabetes appointment.  He has hx of CAD, diabetes, and hyperlipidemia and hypertension.  He refuses statin because he is worried about cancer.  He denies any acute medical concerns.  He has been seeing opthamology.     Review of Systems No chest pain, SOB, HA, dizziness, vision change, N/V, diarrhea, constipation, dysuria, urinary urgency or frequency, myalgias, arthralgias or rash.     Objective:   Physical Exam  Vital signs noted  Well developed well nourished male.  HEENT - Head atraumatic Normocephalic                Eyes - PERRLA, Conjuctiva - clear Sclera- Clear EOMI                Ears - EAC's Wnl TM's Wnl Gross Hearing WNL                Nose - Nares patent                 Throat - oropharanx wnl Respiratory - Lungs CTA bilateral Cardiac - RRR S1 and S2 without murmur GI - Abdomen soft Nontender and bowel sounds active x 4 Extremities - No edema. Neuro - Grossly intact.      Assessment & Plan:  Type II or unspecified type diabetes mellitus with unspecified complication, uncontrolled - Plan: lisinopril (PRINIVIL,ZESTRIL) 40 MG tablet, metFORMIN (GLUCOPHAGE) 1000 MG tablet, POCT CBC, POCT glycosylated hemoglobin (Hb A1C), CMP14+EGFR  CAD in native artery - Plan: metoprolol succinate (TOPROL XL) 100 MG 24 hr tablet  HTN (hypertension), benign - Plan: hydrochlorothiazide (HYDRODIURIL) 25 MG tablet, amLODipine (NORVASC) 5 MG tablet, lisinopril (PRINIVIL,ZESTRIL) 40 MG tablet  Discussed taking fish oil tablets if he does not want statin.  Follow up in 3 months.  Lysbeth Penner FNP

## 2014-04-17 LAB — CMP14+EGFR
ALT: 29 IU/L (ref 0–44)
AST: 18 IU/L (ref 0–40)
Albumin/Globulin Ratio: 2.4 (ref 1.1–2.5)
Albumin: 4.5 g/dL (ref 3.5–4.8)
Alkaline Phosphatase: 53 IU/L (ref 39–117)
BUN/Creatinine Ratio: 13 (ref 10–22)
BUN: 18 mg/dL (ref 8–27)
CO2: 28 mmol/L (ref 18–29)
Calcium: 10 mg/dL (ref 8.6–10.2)
Chloride: 97 mmol/L (ref 97–108)
Creatinine, Ser: 1.36 mg/dL — ABNORMAL HIGH (ref 0.76–1.27)
GFR calc Af Amer: 60 mL/min/{1.73_m2} (ref >59)
GFR calc non Af Amer: 52 mL/min/{1.73_m2} — ABNORMAL LOW (ref >59)
Globulin, Total: 1.9 g/dL (ref 1.5–4.5)
Glucose: 167 mg/dL — ABNORMAL HIGH (ref 65–99)
Potassium: 4.8 mmol/L (ref 3.5–5.2)
Sodium: 137 mmol/L (ref 134–144)
Total Bilirubin: 0.5 mg/dL (ref 0.0–1.2)
Total Protein: 6.4 g/dL (ref 6.0–8.5)

## 2014-05-08 ENCOUNTER — Telehealth: Payer: Self-pay | Admitting: Family Medicine

## 2014-05-08 ENCOUNTER — Other Ambulatory Visit: Payer: Self-pay | Admitting: Family Medicine

## 2014-05-08 MED ORDER — AZITHROMYCIN 250 MG PO TABS: ORAL_TABLET | ORAL | Status: DC

## 2014-05-08 NOTE — Telephone Encounter (Signed)
Pt notified of prescription sent into pharmacy Verbalizes understanding

## 2014-05-08 NOTE — Telephone Encounter (Signed)
zpak sent to pharmacy

## 2014-05-09 ENCOUNTER — Other Ambulatory Visit: Payer: Self-pay | Admitting: *Deleted

## 2014-05-09 MED ORDER — AZITHROMYCIN 250 MG PO TABS: ORAL_TABLET | ORAL | Status: DC

## 2014-06-04 ENCOUNTER — Encounter: Payer: Self-pay | Admitting: *Deleted

## 2014-06-27 ENCOUNTER — Ambulatory Visit: Payer: Medicare Other | Admitting: Interventional Cardiology

## 2014-07-23 ENCOUNTER — Ambulatory Visit: Payer: BC Managed Care – PPO | Admitting: Family Medicine

## 2014-07-23 ENCOUNTER — Ambulatory Visit (INDEPENDENT_AMBULATORY_CARE_PROVIDER_SITE_OTHER): Payer: BLUE CROSS/BLUE SHIELD | Admitting: Family Medicine

## 2014-07-23 ENCOUNTER — Encounter: Payer: Self-pay | Admitting: Family Medicine

## 2014-07-23 VITALS — BP 168/81 | HR 63 | Temp 96.6°F | Ht 65.0 in | Wt 178.2 lb

## 2014-07-23 DIAGNOSIS — E109 Type 1 diabetes mellitus without complications: Secondary | ICD-10-CM | POA: Diagnosis not present

## 2014-07-23 LAB — POCT GLYCOSYLATED HEMOGLOBIN (HGB A1C): HEMOGLOBIN A1C: 7.2

## 2014-07-23 NOTE — Progress Notes (Signed)
   Subjective:    Patient ID: Evan Stanley, male    DOB: 11-10-1940, 74 y.o.   MRN: [id number]  HPI 74 year old gentleman here to follow-up diabetes. He also has has a history of coronary disease and followed by cardiologist in [place] on a yearly basis. Diabetes has been doing well. He is compliant with diet and medication. Last A1c was at goal this past September. He is not on any lipid lowering agents as LDL is 135. He does complain of some skin lesions that are randomly and sporadically on his legs. They first appear as a blister and then become a squamous type lesion before going away. This raises the specter of possible bullous disease. Skin biopsy I think would be in order the new lesion appears.    Review of Systems  Constitutional: Negative.   HENT: Negative.   Eyes: Negative.   Respiratory: Negative.  Negative for shortness of breath.   Cardiovascular: Negative.  Negative for chest pain and leg swelling.  Gastrointestinal: Negative.   Genitourinary: Negative.   Musculoskeletal: Negative.   Skin: Negative.   Neurological: Negative.   Psychiatric/Behavioral: Negative.   All other systems reviewed and are negative.      Objective:   Physical Exam  Constitutional: He is oriented to person, place, and time. He appears well-developed and well-nourished.  HENT:  Head: Normocephalic.  Right Ear: External ear normal.  Left Ear: External ear normal.  Nose: Nose normal.  Mouth/Throat: Oropharynx is clear and moist.  Eyes: Conjunctivae and EOM are normal. Pupils are equal, round, and reactive to light.  Neck: Normal range of motion. Neck supple.  Cardiovascular: Normal rate, regular rhythm and intact distal pulses.   Murmur heard. Pulmonary/Chest: Effort normal and breath sounds normal.  Abdominal: Soft. Bowel sounds are normal.  Musculoskeletal: Normal range of motion.  Neurological: He is alert and oriented to person, place, and time.  Skin: Skin is warm and dry.    Psychiatric: He has a normal mood and affect. His behavior is normal. Judgment and thought content normal.   BP 168/81 mmHg  Pulse 63  Temp(Src) 96.6 F (35.9 C) (Oral)  Ht 5' 5"  (1.651 m)  Wt 178 lb 3.2 oz (80.831 kg)  BMI 29.65 kg/m2       Assessment & Plan:  1. Type 1 diabetes mellitus without complication Continue with metformin as before. He is up-to-date on eye exam and microalbumin - POCT glycosylated hemoglobin (Hb A1C)  Wardell Honour MD

## 2014-07-31 ENCOUNTER — Encounter: Payer: Self-pay | Admitting: Interventional Cardiology

## 2014-07-31 ENCOUNTER — Ambulatory Visit (INDEPENDENT_AMBULATORY_CARE_PROVIDER_SITE_OTHER): Payer: BLUE CROSS/BLUE SHIELD | Admitting: Interventional Cardiology

## 2014-07-31 VITALS — BP 158/74 | HR 59 | Ht 65.0 in | Wt 178.0 lb

## 2014-07-31 DIAGNOSIS — E109 Type 1 diabetes mellitus without complications: Secondary | ICD-10-CM

## 2014-07-31 DIAGNOSIS — I25810 Atherosclerosis of coronary artery bypass graft(s) without angina pectoris: Secondary | ICD-10-CM

## 2014-07-31 DIAGNOSIS — I10 Essential (primary) hypertension: Secondary | ICD-10-CM

## 2014-07-31 DIAGNOSIS — Z954 Presence of other heart-valve replacement: Secondary | ICD-10-CM | POA: Diagnosis not present

## 2014-07-31 DIAGNOSIS — Z953 Presence of xenogenic heart valve: Secondary | ICD-10-CM

## 2014-07-31 DIAGNOSIS — E785 Hyperlipidemia, unspecified: Secondary | ICD-10-CM | POA: Diagnosis not present

## 2014-07-31 NOTE — Progress Notes (Signed)
Patient ID: Evan Stanley, male   DOB: September 17, 1940, 74 y.o.   MRN: [id number]    [address] [place], [place]  [address] Phone: [phone] Fax:  [phone]  Date:  07/31/2014   ID:  Evan Stanley, DOB July 16, 1941, MRN [id number]  PCP:  Wardell Honour, MD   ASSESSMENT:  1.  Bioprosthetic aortic valve replacement, clinically normal function  2. Hyperlipidemia  3. Coronary artery disease, status post artery bypass grafting, asymptomatic  4. Hyperlipidemia  PLAN:  1.  Clinical follow-up in one year  2. Maintain an active lifestyle  3. Call if syncope, chest pain, or dyspnea    SUBJECTIVE: Evan Stanley is a 74 y.o. male  Who is doing well. He denies angina, syncope, and dyspnea. No larger me swelling has been noted. He denies palpitations. Appetite is been stable. He denies chills and fever.   Wt Readings from Last 3 Encounters:  07/31/14 178 lb (80.74 kg)  07/23/14 178 lb 3.2 oz (80.831 kg)  04/16/14 177 lb 6.4 oz (80.468 kg)     Past Medical History  Diagnosis Date  . Diabetes mellitus without complication   . Hyperlipidemia   . Hypertension   . CHF (congestive heart failure)   . Anxiety     Current Outpatient Prescriptions  Medication Sig Dispense Refill  . amLODipine (NORVASC) 5 MG tablet Take 1 tablet (5 mg total) by mouth daily. 90 tablet 3  . aspirin 325 MG tablet Take 325 mg by mouth daily. Take two once daily     . hydrochlorothiazide (HYDRODIURIL) 25 MG tablet Take 1 tablet (25 mg total) by mouth daily. 90 tablet 3  . lisinopril (PRINIVIL,ZESTRIL) 40 MG tablet Take 1 tablet (40 mg total) by mouth daily. 90 tablet 3  . metFORMIN (GLUCOPHAGE) 1000 MG tablet Take 1 tablet (1,000 mg total) by mouth 2 (two) times daily with a meal. 180 tablet 3  . metoprolol succinate (TOPROL XL) 100 MG 24 hr tablet Take 1 tablet (100 mg total) by mouth daily. 90 tablet 3   No current facility-administered medications for this visit.    Allergies:   No Known  Allergies  Social History:  The patient  reports that he has never smoked. He does not have any smokeless tobacco history on file. He reports that he does not drink alcohol or use illicit drugs.   ROS:  Please see the history of present illness.    Denies transient neurological symptoms.  There is no cough, chills, or other complaints. He does have a carbuncle in the left subclavicular area.   All other systems reviewed and negative.   OBJECTIVE: VS:  BP 158/74 mmHg  Pulse 59  Ht 5' 5"  (1.651 m)  Wt 178 lb (80.74 kg)  BMI 29.62 kg/m2 Well nourished, well developed, in no acute distress,   Appears compatible with his stated age HEENT: normal Neck: JVD  flat. Carotid bruit  absent  Cardiac:  normal S1, S2; RRR;  2 to 3/6 systolic murmur at right upper sternal border Lungs:  clear to auscultation bilaterally, no wheezing, rhonchi or rales Abd: soft, nontender, no hepatomegaly Ext: Edema  absent. Pulses  2+ and symmetric Skin: warm and dry Neuro:  CNs 2-12 intact, no focal abnormalities noted  EKG:   Sinus bradycardia 59 bpm. Left atrial abnormality. Short PR interval.       Signed, Illene Labrador III, MD 07/31/2014 4:01 PM

## 2014-07-31 NOTE — Patient Instructions (Signed)
Your physician recommends that you continue on your current medications as directed. Please refer to the Current Medication list given to you today. Your physician wants you to follow-up in: [place].  You will receive a reminder letter in the mail two months in advance. If you don't receive a letter, please call our office to schedule the follow-up appointment.

## 2014-08-06 ENCOUNTER — Encounter: Payer: Self-pay | Admitting: *Deleted

## 2014-08-06 DIAGNOSIS — H4052X3 Glaucoma secondary to other eye disorders, left eye, severe stage: Secondary | ICD-10-CM | POA: Diagnosis not present

## 2014-08-06 DIAGNOSIS — H35032 Hypertensive retinopathy, left eye: Secondary | ICD-10-CM | POA: Diagnosis not present

## 2014-08-06 DIAGNOSIS — E119 Type 2 diabetes mellitus without complications: Secondary | ICD-10-CM | POA: Diagnosis not present

## 2014-08-06 DIAGNOSIS — H35361 Drusen (degenerative) of macula, right eye: Secondary | ICD-10-CM | POA: Diagnosis not present

## 2014-08-06 DIAGNOSIS — H34232 Retinal artery branch occlusion, left eye: Secondary | ICD-10-CM | POA: Diagnosis not present

## 2014-08-06 LAB — HM DIABETES EYE EXAM

## 2014-08-24 ENCOUNTER — Ambulatory Visit (INDEPENDENT_AMBULATORY_CARE_PROVIDER_SITE_OTHER): Payer: BLUE CROSS/BLUE SHIELD | Admitting: Family Medicine

## 2014-08-24 ENCOUNTER — Encounter: Payer: Self-pay | Admitting: Family Medicine

## 2014-08-24 VITALS — BP 146/65 | HR 61 | Temp 97.0°F | Wt 181.8 lb

## 2014-08-24 DIAGNOSIS — L03119 Cellulitis of unspecified part of limb: Secondary | ICD-10-CM

## 2014-08-24 DIAGNOSIS — L02419 Cutaneous abscess of limb, unspecified: Secondary | ICD-10-CM | POA: Diagnosis not present

## 2014-08-24 DIAGNOSIS — I25810 Atherosclerosis of coronary artery bypass graft(s) without angina pectoris: Secondary | ICD-10-CM

## 2014-08-24 MED ORDER — DOXYCYCLINE HYCLATE 100 MG PO TABS: 100.0000 mg | ORAL_TABLET | Freq: Two times a day (BID) | ORAL | Status: DC

## 2014-08-24 NOTE — Progress Notes (Signed)
   Subjective:    Patient ID: Evan Stanley, male    DOB: 1940-12-22, 74 y.o.   MRN: [id number]  HPI Patient is having some sores on both legs and wants to get it checked.  Review of Systems  Constitutional: Negative for fever.  HENT: Negative for ear pain.   Eyes: Negative for discharge.  Respiratory: Negative for cough.   Cardiovascular: Negative for chest pain.  Gastrointestinal: Negative for abdominal distention.  Endocrine: Negative for polyuria.  Genitourinary: Negative for difficulty urinating.  Musculoskeletal: Negative for gait problem and neck pain.  Skin: Negative for color change and rash.  Neurological: Negative for speech difficulty and headaches.  Psychiatric/Behavioral: Negative for agitation.       Objective:    BP 146/65 mmHg  Pulse 61  Temp(Src) 97 F (36.1 C) (Oral)  Wt 181 lb 12.8 oz (82.464 kg) Physical Exam Bilateral thighs with erythema w/o fluctuance 4 cm diameter       Assessment & Plan:     ICD-9-CM ICD-10-CM   1. Cellulitis and abscess of leg 682.6 L02.419 doxycycline (VIBRA-TABS) 100 MG tablet    L03.119      No Follow-up on file.  Lysbeth Penner FNP

## 2014-10-22 ENCOUNTER — Telehealth: Payer: Self-pay | Admitting: Neurology

## 2014-10-22 ENCOUNTER — Encounter (HOSPITAL_COMMUNITY): Payer: Self-pay | Admitting: *Deleted

## 2014-10-22 ENCOUNTER — Emergency Department (HOSPITAL_COMMUNITY): Payer: BLUE CROSS/BLUE SHIELD

## 2014-10-22 ENCOUNTER — Emergency Department (HOSPITAL_COMMUNITY)
Admission: EM | Admit: 2014-10-22 | Discharge: 2014-10-22 | Disposition: A | Discharge status: Home / Self Care | Payer: BLUE CROSS/BLUE SHIELD | Attending: Emergency Medicine | Admitting: Emergency Medicine

## 2014-10-22 ENCOUNTER — Ambulatory Visit (INDEPENDENT_AMBULATORY_CARE_PROVIDER_SITE_OTHER): Payer: BLUE CROSS/BLUE SHIELD | Admitting: Family Medicine

## 2014-10-22 ENCOUNTER — Encounter: Payer: Self-pay | Admitting: Family Medicine

## 2014-10-22 VITALS — BP 130/72 | HR 59 | Temp 97.8°F | Ht 65.0 in | Wt 175.8 lb

## 2014-10-22 DIAGNOSIS — E785 Hyperlipidemia, unspecified: Secondary | ICD-10-CM | POA: Diagnosis not present

## 2014-10-22 DIAGNOSIS — Z8659 Personal history of other mental and behavioral disorders: Secondary | ICD-10-CM | POA: Insufficient documentation

## 2014-10-22 DIAGNOSIS — E119 Type 2 diabetes mellitus without complications: Secondary | ICD-10-CM | POA: Diagnosis not present

## 2014-10-22 DIAGNOSIS — G459 Transient cerebral ischemic attack, unspecified: Secondary | ICD-10-CM

## 2014-10-22 DIAGNOSIS — R4781 Slurred speech: Secondary | ICD-10-CM | POA: Diagnosis present

## 2014-10-22 DIAGNOSIS — Z87891 Personal history of nicotine dependence: Secondary | ICD-10-CM | POA: Diagnosis not present

## 2014-10-22 DIAGNOSIS — I509 Heart failure, unspecified: Secondary | ICD-10-CM | POA: Insufficient documentation

## 2014-10-22 DIAGNOSIS — I10 Essential (primary) hypertension: Secondary | ICD-10-CM | POA: Insufficient documentation

## 2014-10-22 DIAGNOSIS — Z79899 Other long term (current) drug therapy: Secondary | ICD-10-CM | POA: Diagnosis not present

## 2014-10-22 DIAGNOSIS — Z7982 Long term (current) use of aspirin: Secondary | ICD-10-CM | POA: Insufficient documentation

## 2014-10-22 DIAGNOSIS — I2510 Atherosclerotic heart disease of native coronary artery without angina pectoris: Secondary | ICD-10-CM

## 2014-10-22 DIAGNOSIS — H5442 Blindness, left eye, normal vision right eye: Secondary | ICD-10-CM | POA: Insufficient documentation

## 2014-10-22 LAB — CBC
HCT: 44.8 % (ref 39.0–52.0)
Hemoglobin: 15.4 g/dL (ref 13.0–17.0)
MCH: 28.3 pg (ref 26.0–34.0)
MCHC: 34.4 g/dL (ref 30.0–36.0)
MCV: 82.2 fL (ref 78.0–100.0)
Platelets: 215 10*3/uL (ref 150–400)
RBC: 5.45 MIL/uL (ref 4.22–5.81)
RDW: 13.3 % (ref 11.5–15.5)
WBC: 9.8 10*3/uL (ref 4.0–10.5)

## 2014-10-22 LAB — COMPREHENSIVE METABOLIC PANEL
ALBUMIN: 4.5 g/dL (ref 3.5–5.2)
ALK PHOS: 53 U/L (ref 39–117)
ALT: 29 U/L (ref 0–53)
AST: 23 U/L (ref 0–37)
Anion gap: 14 (ref 5–15)
BILIRUBIN TOTAL: 0.5 mg/dL (ref 0.3–1.2)
BUN: 16 mg/dL (ref 6–23)
CO2: 25 mmol/L (ref 19–32)
CREATININE: 1.36 mg/dL — AB (ref 0.50–1.35)
Calcium: 10.5 mg/dL (ref 8.4–10.5)
Chloride: 102 mmol/L (ref 96–112)
GFR calc Af Amer: 59 mL/min — ABNORMAL LOW (ref >90)
GFR calc non Af Amer: 51 mL/min — ABNORMAL LOW (ref >90)
Glucose, Bld: 104 mg/dL — ABNORMAL HIGH (ref 70–99)
Potassium: 4.4 mmol/L (ref 3.5–5.1)
SODIUM: 141 mmol/L (ref 135–145)
Total Protein: 7.1 g/dL (ref 6.0–8.3)

## 2014-10-22 LAB — PROTIME-INR
INR: 0.99 (ref 0.00–1.49)
PROTHROMBIN TIME: 13.2 s (ref 11.6–15.2)

## 2014-10-22 LAB — I-STAT TROPONIN, ED: TROPONIN I, POC: 0.01 ng/mL (ref 0.00–0.08)

## 2014-10-22 LAB — CBG MONITORING, ED
GLUCOSE-CAPILLARY: 101 mg/dL — AB (ref 70–99)
Glucose-Capillary: 104 mg/dL — ABNORMAL HIGH (ref 70–99)

## 2014-10-22 LAB — DIFFERENTIAL
Basophils Absolute: 0.1 10*3/uL (ref 0.0–0.1)
Basophils Relative: 1 % (ref 0–1)
EOS PCT: 2 % (ref 0–5)
Eosinophils Absolute: 0.2 10*3/uL (ref 0.0–0.7)
LYMPHS PCT: 26 % (ref 12–46)
Lymphs Abs: 2.5 10*3/uL (ref 0.7–4.0)
Monocytes Absolute: 0.9 10*3/uL (ref 0.1–1.0)
Monocytes Relative: 9 % (ref 3–12)
NEUTROS ABS: 6.2 10*3/uL (ref 1.7–7.7)
Neutrophils Relative %: 62 % (ref 43–77)

## 2014-10-22 LAB — APTT: APTT: 28 s (ref 24–37)

## 2014-10-22 NOTE — Progress Notes (Signed)
Subjective:    Patient ID: Evan Stanley, male    DOB: 1940-12-15, 74 y.o.   MRN: [id number]  HPI Patient here today for follow up from [facility] urgent care. He was seen there yesterday for dizziness, light-headedness, and slurred speech. The patient comes to the visit today with his wife. This episode happened 2 days ago and lasted for at least 10 hours. When he went to the urgent care center yesterday all his symptoms have resolved. The patient's history is significant in that he had a stroke affecting his left eye in January 2015. He also has had a history of aortic valve replacement and is followed by Dr. Daneen Schick on a yearly basis for this. He recently saw him earlier this year. He currently denies any chest pain or shortness of breath. His wife today still feels like he is having some problems with his speech.       Patient Active Problem List   Diagnosis Date Noted  . Status post aortic valve replacement with bioprosthetic valve 07/05/2013  . Hyperlipidemia 07/05/2013  . Essential hypertension 07/05/2013  . CAD (coronary artery disease)   . DM (diabetes mellitus)    Outpatient Encounter Prescriptions as of 10/22/2014  Medication Sig  . amLODipine (NORVASC) 5 MG tablet Take 1 tablet (5 mg total) by mouth daily.  Marland Kitchen aspirin 325 MG tablet Take 325 mg by mouth daily. Take two once daily   . hydrochlorothiazide (HYDRODIURIL) 25 MG tablet Take 1 tablet (25 mg total) by mouth daily.  Marland Kitchen lisinopril (PRINIVIL,ZESTRIL) 40 MG tablet Take 1 tablet (40 mg total) by mouth daily.  . metFORMIN (GLUCOPHAGE) 1000 MG tablet Take 1 tablet (1,000 mg total) by mouth 2 (two) times daily with a meal.  . metoprolol succinate (TOPROL XL) 100 MG 24 hr tablet Take 1 tablet (100 mg total) by mouth daily.  . [DISCONTINUED] doxycycline (VIBRA-TABS) 100 MG tablet Take 1 tablet (100 mg total) by mouth 2 (two) times daily.    Review of Systems  Constitutional: Negative.   HENT: Negative.   Eyes: Negative.     Respiratory: Negative.   Cardiovascular: Negative.   Gastrointestinal: Negative.   Endocrine: Negative.   Genitourinary: Negative.   Musculoskeletal: Negative.   Skin: Negative.   Allergic/Immunologic: Negative.   Neurological: Positive for light-headedness (slight - this morning ).       Wife stated that speech is still slightly slurred  Hematological: Negative.   Psychiatric/Behavioral: Negative.        Objective:   Physical Exam  Constitutional: He is oriented to person, place, and time. He appears well-developed and well-nourished. No distress.  HENT:  Head: Normocephalic and atraumatic.  Eyes: Conjunctivae and EOM are normal. Pupils are equal, round, and reactive to light. Right eye exhibits no discharge. Left eye exhibits no discharge. No scleral icterus.  Neck: Normal range of motion. Neck supple. No thyromegaly present.  The patient has bilateral carotid bruits.  Cardiovascular: Normal rate and regular rhythm.  Exam reveals no gallop and no friction rub.   Murmur heard. At 72/m  Pulmonary/Chest: Effort normal and breath sounds normal. No respiratory distress. He has no wheezes. He has no rales. He exhibits no tenderness.  Abdominal: Soft. Bowel sounds are normal. He exhibits no mass. There is no tenderness. There is no rebound and no guarding.  Musculoskeletal: Normal range of motion. He exhibits no edema.  The patient has good movement of both upper and lower extremities and good strength.  Lymphadenopathy:  He has no cervical adenopathy.  Neurological: He is alert and oriented to person, place, and time. He has normal reflexes. No cranial nerve deficit.  There is no facial asymmetry.  Skin: Skin is warm and dry. No rash noted.  Psychiatric: He has a normal mood and affect. His behavior is normal. Judgment and thought content normal.  The patient is alert and cooperative and responding to questions appropriately.  Nursing note and vitals reviewed.  BP 130/72 mmHg   Pulse 59  Temp(Src) 97.8 F (36.6 C) (Oral)  Ht 5' 5"  (1.651 m)  Wt 175 lb 12.8 oz (79.742 kg)  BMI 29.25 kg/m2        Assessment & Plan:  1. Transient cerebral ischemia, unspecified transient cerebral ischemia type -We will arrange for the patient have a CT scan of his head and possibly carotid Dopplers and an echocardiogram----after speaking with the neurologist and giving the history he felt that all this should be done on a more urgent basis through the emergency room today.  2. CAD in native artery -The patient is followed by the cardiologist regularly and saw him recently. We will discuss the patient's history with his cardiologist Dr. Daneen Schick  3. HTN (hypertension), benign -The blood pressure generally runs in the 140s over the 70s at home but is actually better than that in the office today.  4. Hyperlipemia  Patient Instructions  We will arrange for you to have visit with the emergency room to get a CT scan of your head. We will also arrange for you to have carotid Dopplers and an echocardiogram through the emergency room and the cardiologist In the meantime, decrease activity and continue to take your 325 mg aspirin twice daily. I have spoken with Dr. Jannifer Franklin and will speak with Dr. Daneen Schick the cardiologist.   Arrie Senate MD

## 2014-10-22 NOTE — ED Provider Notes (Signed)
CSN: [id number]     Arrival date & time 10/22/14  1136 History   First MD Initiated Contact with Patient 10/22/14 1641     Chief Complaint  Patient presents with  . Stroke Symptoms     (Consider location/radiation/quality/duration/timing/severity/associated sxs/prior Treatment) Patient is a 74 y.o. male presenting with neurologic complaint. The history is provided by the patient.  Neurologic Problem This is a new problem. The current episode started in the past 7 days (Saturday). The problem occurs constantly. The problem has been resolved. Pertinent negatives include no arthralgias, chest pain, chills, fever, myalgias, nausea or weakness. Associated symptoms comments: Trouble swallowing and slurred speech. Nothing aggravates the symptoms. He has tried nothing for the symptoms. The treatment provided significant (gone by Sunday morning) relief.    Past Medical History  Diagnosis Date  . Diabetes mellitus without complication   . Hyperlipidemia   . Hypertension   . CHF (congestive heart failure)   . Anxiety    Past Surgical History  Procedure Laterality Date  . Cardiac valve replacement    . Aortic valve replacement     Family History  Problem Relation Age of Onset  . Heart disease Mother   . Stroke Father    History  Substance Use Topics  . Smoking status: Former Smoker    Quit date: 07/20/1984  . Smokeless tobacco: Not on file  . Alcohol Use: No    Review of Systems  Constitutional: Negative for fever, chills and activity change.  HENT: Positive for trouble swallowing.   Respiratory: Negative for choking and shortness of breath.   Cardiovascular: Negative for chest pain.  Gastrointestinal: Negative for nausea, diarrhea and constipation.  Musculoskeletal: Negative for myalgias and arthralgias.  Neurological: Positive for speech difficulty. Negative for weakness.  All other systems reviewed and are negative.     Allergies  Review of patient's allergies indicates  no known allergies.  Home Medications   Prior to Admission medications   Medication Sig Start Date End Date Taking? Authorizing Provider  amLODipine (NORVASC) 5 MG tablet Take 1 tablet (5 mg total) by mouth daily. 04/16/14   Lysbeth Penner, FNP  aspirin 325 MG tablet Take 325 mg by mouth daily. Take two once daily     Historical Provider, MD  hydrochlorothiazide (HYDRODIURIL) 25 MG tablet Take 1 tablet (25 mg total) by mouth daily. 04/16/14   Lysbeth Penner, FNP  lisinopril (PRINIVIL,ZESTRIL) 40 MG tablet Take 1 tablet (40 mg total) by mouth daily. 04/16/14   Lysbeth Penner, FNP  metFORMIN (GLUCOPHAGE) 1000 MG tablet Take 1 tablet (1,000 mg total) by mouth 2 (two) times daily with a meal. 04/16/14   Lysbeth Penner, FNP  metoprolol succinate (TOPROL XL) 100 MG 24 hr tablet Take 1 tablet (100 mg total) by mouth daily. 04/16/14   Lysbeth Penner, FNP   BP 128/70 mmHg  Pulse 109  Temp(Src) 97.9 F (36.6 C) (Oral)  Resp 18  SpO2 97% Physical Exam  Constitutional: He is oriented to person, place, and time. He appears well-developed and well-nourished. No distress.  HENT:  Head: Normocephalic and atraumatic.  Mouth/Throat: Oropharynx is clear and moist. No oropharyngeal exudate.  Blind in left eye. Pupil non-reactive.  Eyes: Conjunctivae and EOM are normal.  Neck: Normal range of motion. Neck supple. Carotid bruit is present (bilaterally).  Cardiovascular: Normal rate, regular rhythm, normal heart sounds and intact distal pulses.  Exam reveals no gallop and no friction rub.   No murmur heard. Pulmonary/Chest:  Effort normal and breath sounds normal. No respiratory distress. He has no wheezes. He has no rales.  Abdominal: Soft. He exhibits no distension and no mass. There is no tenderness. There is no rebound and no guarding.  Musculoskeletal: Normal range of motion. He exhibits no edema or tenderness.  Lymphadenopathy:    He has no cervical adenopathy.  Neurological: He is alert and  oriented to person, place, and time. He has normal strength. No cranial nerve deficit or sensory deficit. He displays a negative Romberg sign. Coordination and gait normal.  Normal gait and tandem gait. Intact finger to nose and heel to shin testing  Skin: Skin is warm and dry. No rash noted. He is not diaphoretic.  Psychiatric: He has a normal mood and affect. His behavior is normal. Judgment and thought content normal.  Nursing note and vitals reviewed.   ED Course  Procedures (including critical care time) Labs Review Labs Reviewed  COMPREHENSIVE METABOLIC PANEL - Abnormal; Notable for the following:    Glucose, Bld 104 (*)    Creatinine, Ser 1.36 (*)    GFR calc non Af Amer 51 (*)    GFR calc Af Amer 59 (*)    All other components within normal limits  CBG MONITORING, ED - Abnormal; Notable for the following:    Glucose-Capillary 104 (*)    All other components within normal limits  CBG MONITORING, ED - Abnormal; Notable for the following:    Glucose-Capillary 101 (*)    All other components within normal limits  PROTIME-INR  APTT  CBC  DIFFERENTIAL  I-STAT TROPOININ, ED    Imaging Review Ct Head (brain) Wo Contrast  10/22/2014   CLINICAL DATA:  Slurred speech and difficulty swallowing for 2 days  EXAM: CT HEAD WITHOUT CONTRAST  TECHNIQUE: Contiguous axial images were obtained from the base of the skull through the vertex without intravenous contrast.  COMPARISON:  None.  FINDINGS: Moderate diffuse atrophy is stable. There is no intracranial mass, hemorrhage, extra-axial fluid collection, or midline shift. There is mild patchy small vessel disease in the centra semiovale bilaterally. Elsewhere gray-white compartments appear normal. No acute infarct is demonstrable on this study. The bony calvarium appears intact. The mastoid air cells are clear.  IMPRESSION: Moderate diffuse atrophy with mild patchy supratentorial small vessel disease. No intracranial mass, hemorrhage, or acute  appearing infarct.   Electronically Signed   By: Lowella Grip III M.D.   On: 10/22/2014 14:34     EKG Interpretation   Date/Time:  Monday October 22 2014 12:40:04 EDT Ventricular Rate:  56 PR Interval:  156 QRS Duration: 84 QT Interval:  400 QTC Calculation: 386 R Axis:   66 Text Interpretation:  Sinus bradycardia Nonspecific ST and T wave  abnormality Abnormal ECG Confirmed by Hazle Coca [phone]) on 10/22/2014  5:09:38 PM      MDM   Final diagnoses:  Transient cerebral ischemia, unspecified transient cerebral ischemia type    74 year old male with history of coronary disease, diabetes, hypertension, hyperlipidemia, stroke presents with TIA. Speech difficulty and difficulty swallowing on Saturday. He went to bed with the symptoms and woke up on Sunday without symptoms. Evaluated by urgent care Sunday and referred to PCP today. PCP discussed with Dr. Jannifer Franklin of [facility] neurology who recommended urgent stroke evaluation. CT head obtained from triage shows no acute intracranial abnormality. laboratory workup reassuring. I attempted to discuss the case with Dr. Laurance Flatten of [place] But was unable to reach him or a  partner after business hours. I discussed the patient that this required inpatient management with MRI, carotid Dopplers, echo and this could not be performed through the emergency department as they were expecting. After this discussion, they refused admission. They are aware that we were unable to discuss with Dr. Laurance Flatten and they will call him first thing in the morning to set up follow-up for completion of TIA workup. They understand the risks of leaving and not completing the workup through the emergency department/admission. Stable for discharge.   Larence Penning, MD 10/22/14 [place], MD 10/22/14 [phone]

## 2014-10-22 NOTE — Telephone Encounter (Signed)
This patient has had some new symptoms of stroke over the weekend, began about 48 hours ago. The patient has developed some slurring of speech. He likely should go through the emergency room for a rapid stroke evaluation. He has an aortic valve replacement, mechanical valve in place.

## 2014-10-22 NOTE — Patient Instructions (Addendum)
We will arrange for you to have visit with the emergency room to get a CT scan of your head. We will also arrange for you to have carotid Dopplers and an echocardiogram through the emergency room and the cardiologist In the meantime, decrease activity and continue to take your 325 mg aspirin twice daily. I have spoken with Dr. Jannifer Franklin and will speak with Dr. Daneen Schick the cardiologist.

## 2014-10-22 NOTE — Discharge Instructions (Signed)
Transient Ischemic Attack A transient ischemic attack (TIA) is a "warning stroke" that causes stroke-like symptoms. A TIA does not cause lasting damage to the brain. It is important to know when to get help and what to do to prevent stroke or death.  HOME CARE   Take all medicines exactly as told by your doctor. Understand all your medicine instructions.  You may need to take aspirin or warfarin medicine. Take warfarin exactly as told.  Taking too much or too little warfarin is dangerous. Blood tests must be done as often as told by your doctor. These blood tests help your doctor make sure the amount of warfarin you are taking is right. A PT blood test measures how long it takes for blood to clot. Your PT is used to calculate another value called an INR. Your PT and INR help your doctor adjust your warfarin dosage.  Food can cause problems with warfarin and affect the results of your blood tests. This is true for foods high in vitamin K. Spinach, kale, broccoli, cabbage, collard and turnip greens, Brussels sprouts, peas, cauliflower, seaweed, and parsley are high in vitamin K as well as beef and pork liver, green tea, and soybean oil. Eat the same amount of food high in vitamin K. Avoid major changes in your diet. Tell your doctor before changing your diet. Talk to a food specialist (dietitian) if you have questions.  Many medicines can cause problems with warfarin and affect your PT and INR. Tell your doctor about all medicines you take. This includes vitamins and dietary pills (supplements). Be careful with aspirin and medicines that relieve redness, soreness, and puffiness (inflammation). Do not take or stop medicines unless your doctor tells you to.  Warfarin can cause a lot of bruising or bleeding. Hold pressure over cuts for longer than normal. Talk to your doctor about other side effects of warfarin.  Avoid sports or activities that may cause injury or bleeding.  Be careful when you shave,  floss your teeth, or use sharp objects.  Avoid alcoholic drinks or drink very little alcohol while taking warfarin. Tell your doctor if you change how much alcohol you drink.  Tell your dentist and other doctors that you take warfarin before procedures.  Eat 5 or more servings of fruits and vegetables a day.  Follow your diet program as told, if you are given one.  Keep a healthy weight.  Stay active. Try to get at least 30 minutes of activity on most or all days.  Do not smoke.  Limit how much alcohol you drink even if you are not taking warfarin. Moderate alcohol use is:  No more than 2 drinks each day for men.  No more than 1 drink each day for women who are not pregnant.  Stop abusing drugs.  Keep your home safe so you do not fall. Try:  Putting grab bars in the bedroom and bathroom.  Raising toilet seats.  Putting a seat in the shower.  Keep all doctor visits a told. GET HELP IF:  Your personality changes.  You have trouble swallowing.  You are seeing two of everything.  You are dizzy.  You have a fever.  Your skin starts to break down. GET HELP RIGHT AWAY IF:  The symptoms below may be a sign of an emergency. Do not wait to see if the symptoms go away. Call for help (911 in [place] Do not drive yourself to the hospital.  You have sudden weakness or numbness on  the face, arm, or leg (especially on one side of the body).  You have sudden trouble walking or moving your arms or legs.  You have sudden confusion.  You have trouble talking or understanding.  You have sudden trouble seeing in one or both eyes.  You lose your balance or your movements are not smooth.  You have a sudden, severe headache with no known cause.  You have new chest pain or you feel your heart beating in a unsteady way.  You are partly or totally unaware of what is going on around you. MAKE SURE YOU:   Understand these instructions.  Will watch your condition.  Will get  help right away if you are not doing well or get worse. Document Released: 04/14/2008 Document Revised: 11/20/2013 Document Reviewed: 10/11/2013 [facility] Patient Information 2015 [place], [place]. This information is not intended to replace advice given to you by your health care provider. Make sure you discuss any questions you have with your health care provider.

## 2014-10-22 NOTE — ED Notes (Signed)
Saturday patient was slurring speech for several hours and reported dizziness and lightheadedness.  Pt was sent here for CT HEAD, CAROTID DOPPLERS, AND 2 DECHO.    The patient was seen at [facility] today and Dr. Laurance Flatten spoke with Dr. Jannifer Franklin and Dr. Jannifer Franklin will speak with Dr. [name] Cards..See the written copy of this report in the patient's paper medical record.  These results did not interface directly into the electronic medical record and are summarized here. See sheet

## 2014-10-22 NOTE — ED Notes (Addendum)
Pt had slurred speech and problems swallowing on 4-2 , issues lasted 9 hours. No N&V,no blackouts, no pain. He was lightheaded on 4-2 but no issues today. Patient is able to speak clearly and walk without any problems today in the ER.

## 2014-10-22 NOTE — ED Notes (Signed)
Pt was ambulatory and refused wheelchair.

## 2014-10-22 NOTE — ED Notes (Signed)
Spoke with Dr. Jannifer Franklin who advised that he has never seen this patient.   He advised needs to be evaluated by ED.

## 2014-10-23 ENCOUNTER — Telehealth: Payer: Self-pay | Admitting: Family Medicine

## 2014-10-23 ENCOUNTER — Other Ambulatory Visit: Payer: Self-pay | Admitting: *Deleted

## 2014-10-23 DIAGNOSIS — G459 Transient cerebral ischemic attack, unspecified: Secondary | ICD-10-CM

## 2014-10-23 NOTE — Telephone Encounter (Signed)
Please set up to get carotid Dopplers done and also an appointment with Dr. Jannifer Franklin the neurologist

## 2014-10-26 ENCOUNTER — Other Ambulatory Visit: Payer: Self-pay

## 2014-10-26 ENCOUNTER — Encounter: Payer: Self-pay | Admitting: Vascular Surgery

## 2014-10-26 ENCOUNTER — Ambulatory Visit (HOSPITAL_COMMUNITY): Payer: BLUE CROSS/BLUE SHIELD | Attending: Family Medicine | Admitting: *Deleted

## 2014-10-26 ENCOUNTER — Ambulatory Visit (INDEPENDENT_AMBULATORY_CARE_PROVIDER_SITE_OTHER): Payer: BLUE CROSS/BLUE SHIELD | Admitting: Vascular Surgery

## 2014-10-26 VITALS — BP 120/66 | HR 62 | Ht 67.0 in | Wt 175.0 lb

## 2014-10-26 DIAGNOSIS — I6523 Occlusion and stenosis of bilateral carotid arteries: Secondary | ICD-10-CM

## 2014-10-26 DIAGNOSIS — I6521 Occlusion and stenosis of right carotid artery: Secondary | ICD-10-CM | POA: Diagnosis not present

## 2014-10-26 DIAGNOSIS — G459 Transient cerebral ischemic attack, unspecified: Secondary | ICD-10-CM | POA: Insufficient documentation

## 2014-10-26 DIAGNOSIS — I6522 Occlusion and stenosis of left carotid artery: Secondary | ICD-10-CM | POA: Diagnosis not present

## 2014-10-26 DIAGNOSIS — I6529 Occlusion and stenosis of unspecified carotid artery: Secondary | ICD-10-CM | POA: Insufficient documentation

## 2014-10-26 DIAGNOSIS — H5461 Unqualified visual loss, right eye, normal vision left eye: Secondary | ICD-10-CM

## 2014-10-26 DIAGNOSIS — Z01810 Encounter for preprocedural cardiovascular examination: Secondary | ICD-10-CM | POA: Diagnosis not present

## 2014-10-26 NOTE — Progress Notes (Signed)
Carotid duplex Scan Performed

## 2014-10-26 NOTE — Progress Notes (Signed)
New Carotid Patient  Referred by:  Wardell Honour, MD [place], [place] [address]  Reason for referral: Right carotid stenosis >80%, Left carotid occlusion  History of Present Illness  Evan Stanley is a 74 y.o. (03-03-1941) male who presents with chief complaint: recent episode of difficulty speaking.  This patient previous has had an episode of left eye visual field loss which was attributed to stroke.  This Saturday, he having had a <1 hour episode of slurring of speech.  HCT at that point was not diagnostic.  He was seen to his cardiologist today for B carotid studies which demonstrated: RICA >70% stenosis, LICA: occluded.   The patient has never had amaurosis fugax but has developed partial visual field loss.  The patient has never had facial drooping or hemiplegia.  The patient has had expressive aphasia.   The patient's previous neurologic deficits have resolved.  The patient's risks factors for carotid disease include: DM, HLD, HTN, afib.  Past Medical History  Diagnosis Date  . Diabetes mellitus without complication   . Hyperlipidemia   . Hypertension   . CHF (congestive heart failure)   . Anxiety   . Atrial fibrillation     Past Surgical History  Procedure Laterality Date  . Cardiac valve replacement    . Aortic valve replacement    . Back surgery      History   Social History  . Marital Status: Married    Spouse Name: N/A  . Number of Children: N/A  . Years of Education: N/A   Occupational History  . Not on file.   Social History Main Topics  . Smoking status: Former Smoker    Quit date: 07/20/1984  . Smokeless tobacco: Not on file  . Alcohol Use: No  . Drug Use: No  . Sexual Activity: Not on file   Other Topics Concern  . Not on file   Social History Narrative    Family History  Problem Relation Age of Onset  . Heart disease Mother   . Stroke Father     Current Outpatient Prescriptions on File Prior to Visit  Medication Sig  Dispense Refill  . amLODipine (NORVASC) 5 MG tablet Take 1 tablet (5 mg total) by mouth daily. 90 tablet 3  . aspirin 325 MG tablet Take 325 mg by mouth daily. Take two once daily     . hydrochlorothiazide (HYDRODIURIL) 25 MG tablet Take 1 tablet (25 mg total) by mouth daily. 90 tablet 3  . lisinopril (PRINIVIL,ZESTRIL) 40 MG tablet Take 1 tablet (40 mg total) by mouth daily. 90 tablet 3  . metFORMIN (GLUCOPHAGE) 1000 MG tablet Take 1 tablet (1,000 mg total) by mouth 2 (two) times daily with a meal. 180 tablet 3  . metoprolol succinate (TOPROL XL) 100 MG 24 hr tablet Take 1 tablet (100 mg total) by mouth daily. 90 tablet 3   No current facility-administered medications on file prior to visit.    No Known Allergies  REVIEW OF SYSTEMS:  (Positives checked otherwise negative)  CARDIOVASCULAR:  []  chest pain, []  chest pressure, []  palpitations, []  shortness of breath when laying flat, []  shortness of breath with exertion,  []  pain in feet when walking, []  pain in feet when laying flat, []  history of blood clot in veins (DVT), []  history of phlebitis, []  swelling in legs, []  varicose veins  PULMONARY:  []  productive cough, []  asthma, []  wheezing  NEUROLOGIC:  []  weakness in arms or legs, []   numbness in arms or legs, [x]  difficulty speaking or slurred speech, [x]  partial loss of vision in one eye, []  dizziness  HEMATOLOGIC:  []  bleeding problems, []  problems with blood clotting too easily  MUSCULOSKEL:  []  joint pain, []  joint swelling  GASTROINTEST:  []  vomiting blood, []  blood in stool     GENITOURINARY:  []  burning with urination, []  blood in urine  PSYCHIATRIC:  []  history of major depression  INTEGUMENTARY:  []  rashes, []  ulcers  CONSTITUTIONAL:  []  fever, []  chills   For VQI Use Only  PRE-ADM LIVING: Home  AMB STATUS: Ambulatory  CAD Sx: History of MI, but no symptoms No MI within 6 months  PRIOR CHF: None  STRESS TEST: [x]  No, [ ]  Normal, [ ]  + ischemia, [ ]  + MI, [ ]   Both   Physical Examination  Filed Vitals:   10/26/14 1512  BP: 120/66  Pulse: 62  Height: 5' 7"  (1.702 m)  Weight: 175 lb (79.379 kg)  SpO2: 100%   Body mass index is 27.4 kg/(m^2).  General: A&O x 3, WDWN  Head: [place]/AT  Ear/Nose/Throat: Hearing grossly intact, nares w/o erythema or drainage, oropharynx w/o Erythema/Exudate, Mallampati score: 3  Eyes: PERRLA, EOMI  Neck: Supple, no nuchal rigidity, no palpable LAD  Pulmonary: Sym exp, good air movt, CTAB, no rales, rhonchi, & wheezing  Cardiac: RRR, Nl S1, S2, no Murmurs, rubs or gallops  Vascular: Vessel Right Left  Radial Palpable Palpable  Brachial Palpable Palpable  Carotid Palpable, without bruit Palpable, without bruit  Aorta  Not palpable N/A  Femoral Palpable Palpable  Popliteal Not palpable Not palpable  PT Not Palpable Not Palpable  DP Not Palpable Palpable   Gastrointestinal: soft, NTND, -G/R, - HSM, - masses, - CVAT B  Musculoskeletal: M/S 5/5 throughout , Extremities without ischemic changes   Neurologic: CN 2-12 intact , Pain and light touch intact in extremities , Motor exam as listed above  Psychiatric: Judgment intact, Mood & affect appropriate for pt's clinical situation  Dermatologic: See M/S exam for extremity exam, no rashes otherwise noted  Lymph : No Cervical, Axillary, or Inguinal lymphadenopathy    Non-Invasive Vascular Imaging  Outside CAROTID DUPLEX (Date: 10/26/2014):   R ICA stenosis: 80-99%  R VA: patent and antegrade  L ICA stenosis: occluded  L VA: patent and antegrade  Disease extends proximally >2 cm to origin  Outside Studies/Documentation 6 pages of outside documents were reviewed including: outpatient carotid duplex and ED report.  Medical Decision Making  Evan Stanley is a 74 y.o. male who presents with: sx L ICA stenosis occlusion, Asx R ICA stenosis >80%.   This patient likely had a TIA related to progress L ICA stenosis resulting in occlusion.  He is  asx from the R ICA stenosis.  There is no emergent need to complete a R CEA.    Due to anatomic concerns raised by the duplex, I have offered the patient: CTA Neck to evaluate the extent of the R ICA stenosis.  If the R ICA stenosis does not extend pass jaw line, then will proceed with R CEA.  If the disease appears to be more proximal, I would get a carotid angiogram to see if he is a candidate for stenting, in order to avoid cranial nerve injury.    I discussed in depth with the patient the nature of atherosclerosis, and emphasized the importance of maximal medical management including strict control of blood pressure, blood glucose, and lipid levels, obtaining  regular exercise, antiplatelet agents, and cessation of smoking.   The patient is currently not on a statin: no medical indication per patient as his cholesterol is <200 despite his PMH.  The patient is currently on an anti-platelet: ASA.  The patient is aware that without maximal medical management the underlying atherosclerotic disease process will progress, limiting the benefit of any interventions.  Pt will follow up this week after the CTA Neck.    Thank you for allowing [place] to participate in this patient's care.  [name], MD [facility] Office: [phone] Pager: [phone]  10/26/2014, 5:03 PM

## 2014-10-29 ENCOUNTER — Telehealth: Payer: Self-pay | Admitting: Vascular Surgery

## 2014-10-29 ENCOUNTER — Telehealth: Payer: Self-pay | Admitting: *Deleted

## 2014-10-29 NOTE — Telephone Encounter (Signed)
We need to discuss test ASAP  --- we will need to refer.

## 2014-10-29 NOTE — Telephone Encounter (Signed)
Spoke with pt and wife - CTA 11/02/14 10:50 am and see BLC after -  Wife verbalized understanding

## 2014-10-29 NOTE — Telephone Encounter (Signed)
-----   Message from Chipper Herb, MD sent at 10/28/2014  8:13 PM EDT ----- Please schedule this patient with the vascular surgeon as soon as possible, Dr. Sherren Mocha Early due to these findings and a recent TIA+++++++++ Please call the patient also and let him know that he has major blockages bilaterally with the internal carotid arteries

## 2014-11-01 ENCOUNTER — Encounter: Payer: Self-pay | Admitting: Vascular Surgery

## 2014-11-02 ENCOUNTER — Encounter: Payer: Self-pay | Admitting: Vascular Surgery

## 2014-11-02 ENCOUNTER — Ambulatory Visit
Admission: RE | Admit: 2014-11-02 | Discharge: 2014-11-02 | Disposition: A | Discharge status: Home / Self Care | Payer: BLUE CROSS/BLUE SHIELD | Source: Ambulatory Visit | Attending: Vascular Surgery | Admitting: Vascular Surgery

## 2014-11-02 ENCOUNTER — Ambulatory Visit (INDEPENDENT_AMBULATORY_CARE_PROVIDER_SITE_OTHER): Payer: BLUE CROSS/BLUE SHIELD | Admitting: Vascular Surgery

## 2014-11-02 VITALS — BP 129/67 | HR 63 | Ht 67.0 in | Wt 176.3 lb

## 2014-11-02 DIAGNOSIS — I6521 Occlusion and stenosis of right carotid artery: Secondary | ICD-10-CM

## 2014-11-02 DIAGNOSIS — I6523 Occlusion and stenosis of bilateral carotid arteries: Secondary | ICD-10-CM

## 2014-11-02 DIAGNOSIS — I6522 Occlusion and stenosis of left carotid artery: Secondary | ICD-10-CM

## 2014-11-02 DIAGNOSIS — Z01810 Encounter for preprocedural cardiovascular examination: Secondary | ICD-10-CM

## 2014-11-02 MED ORDER — IOPAMIDOL (ISOVUE-370) INJECTION 76%
75.0000 mL | Freq: Once | INTRAVENOUS | Status: AC | PRN
  Administered 2014-11-02: 75 mL via INTRAVENOUS

## 2014-11-02 NOTE — Addendum Note (Signed)
Addended by: Dorthula Rue L on: 11/02/2014 04:42 PM   Modules accepted: Orders

## 2014-11-02 NOTE — Progress Notes (Signed)
Established Carotid Patient  History of Present Illness  Evan Stanley is a 74 y.o. (05/27/1941) male who presents with chief complaint: follow up on CTA Neck.  Previous carotid studies demonstrated: RICA >28% stenosis, LICA occluded.  Pt has had no further TIA or CVA sx.  He has had no change in left visual fields which he already has a lasting defect from possible prior CVA.  He has had no recurrence of his expressive aphasia.  The patient's PMH, PSH, SH, FamHx, Med, and Allergies are unchanged from 10/26/14.  On ROS today: no CVA or TIA sx, neuro function intact, no aphasia  Physical Examination  Filed Vitals:   11/02/14 1201 11/02/14 1205  BP: 131/74 129/67  Pulse: 63   Height: 5' 7"  (1.702 m)   Weight: 176 lb 4.8 oz (79.969 kg)   SpO2: 99%    Body mass index is 27.61 kg/(m^2).  General: A&O x 3, WDWN  Pulmonary: Sym exp, good air movt, CTAB, no rales, rhonchi, & wheezing  Cardiac: RRR, Nl S1, S2, no Murmurs, rubs or gallops, B proximal carotid pulses, no bruits  Musculoskeletal: M/S 5/5 throughout , Extremities without ischemic changes   Neurologic: CN 2-12 intact , Pain and light touch intact in extremities , Motor exam as listed above  Radiology: Ct Angio Neck W/cm &/or Wo/cm  11/02/2014   CLINICAL DATA:  Recent episode of speech difficulty. Also history of LEFT eye amaurosis. Abnormal carotid Dopplers suggesting LEFT ICA occlusion. Initial encounter.  EXAM: CT ANGIOGRAPHY NECK  TECHNIQUE: Multidetector CT imaging of the neck was performed using the standard protocol during bolus administration of intravenous contrast. Multiplanar CT image reconstructions and MIPs were obtained to evaluate the vascular anatomy. Carotid stenosis measurements (when applicable) are obtained utilizing NASCET criteria, using the distal internal carotid diameter as the denominator.  CONTRAST:  75 mL  Isovue 370.  COMPARISON:  CT head 10/22/2014.  FINDINGS: Aortic arch: Standard branching. Imaged  portion shows no evidence of aneurysm or dissection. No significant stenosis of the major arch vessel origins.  Right carotid system: 60% stenosis with both calcific and noncalcified plaque, most narrow approximately 1.5 cm above the origin. Ratio 2 mm proximal/5 mm distal. Anatomically, this stenosis lies slightly below the angle of the mandible, but fairly high at the mid C3 vertebral body level. No evidence of dissection, or occlusion.  Left carotid system: Complete occlusion LEFT internal carotid artery due to calcified and noncalcified plaque. Non stenotic atheromatous change RIGHT external carotid artery origin. This vessel appears hypertrophied. Collateral flow to the intracranial circulation not established on this CTA neck exam, although none is seen at the level of the vertical petrous segment.  Vertebral arteries: LEFT vertebral dominant. No evidence of dissection, stenosis (50% or greater) or occlusion.  Skeleton: Spondylosis.  No worrisome osseous lesions.  Other: Unremarkable. No lung apex lesion. Prior median sternotomy for AVR.  IMPRESSION: Estimated 60% stenosis RIGHT ICA most narrow approximately 1.5 cm above the origin.  Occluded LEFT ICA.   Electronically Signed   By: Rolla Flatten M.D.   On: 11/02/2014 11:43   Based on the CTA, the L ICA is completed occluded without string sign.  The R ICA has 2 mm lumen putting the stenosis <80%.  R ICA stenosis is near angle of jaw on A-P reconstruction.  Medical Decision Making  Aero Drummonds is a 74 y.o. male who presents with: asx R ICA stenosis 60%, L ICA occlusion   The technique of the CTA  appears to be good, so I am inclined to believe the CTA more than the carotid duplex which is dependent on operator expertise.  Based on the patient's vascular studies and examination, I have offered the patient: maximal medical mgmt and follow in 6 months with B carotid duplex.  If velocities in R ICA continue to be elevated, carotid angiography might be  necessary to determine exact level of the disease, given the surgical difficulty with placement of shunt with a high lesion.  I discussed in depth with the patient the nature of atherosclerosis, and emphasized the importance of maximal medical management including strict control of blood pressure, blood glucose, and lipid levels, antiplatelet agents, obtaining regular exercise, and cessation of smoking.    The patient is aware that without maximal medical management the underlying atherosclerotic disease process will progress, limiting the benefit of any interventions. The patient is currently not on a statin: as reported not medical indicated due to Chol < 200. The patient is currently on an anti-platelet: ASA.  Thank you for allowing [place] to participate in this patient's care.  [name], MD [facility] Office: [phone] Pager: [phone]  11/02/2014, 12:50 PM

## 2014-11-28 ENCOUNTER — Encounter: Payer: Self-pay | Admitting: Family Medicine

## 2014-11-28 ENCOUNTER — Ambulatory Visit (INDEPENDENT_AMBULATORY_CARE_PROVIDER_SITE_OTHER): Payer: BLUE CROSS/BLUE SHIELD | Admitting: Family Medicine

## 2014-11-28 VITALS — BP 139/75 | HR 59 | Temp 97.9°F | Ht 65.0 in | Wt 176.0 lb

## 2014-11-28 DIAGNOSIS — E109 Type 1 diabetes mellitus without complications: Secondary | ICD-10-CM

## 2014-11-28 DIAGNOSIS — I6523 Occlusion and stenosis of bilateral carotid arteries: Secondary | ICD-10-CM

## 2014-11-28 DIAGNOSIS — E785 Hyperlipidemia, unspecified: Secondary | ICD-10-CM | POA: Diagnosis not present

## 2014-11-28 DIAGNOSIS — I10 Essential (primary) hypertension: Secondary | ICD-10-CM

## 2014-11-28 LAB — POCT GLYCOSYLATED HEMOGLOBIN (HGB A1C): HEMOGLOBIN A1C: 7.4

## 2014-11-28 LAB — POCT UA - MICROALBUMIN: Microalbumin Ur, POC: NEGATIVE mg/L

## 2014-11-28 NOTE — Progress Notes (Signed)
   Subjective:    Patient ID: Evan Stanley, male    DOB: 1941/04/11, 74 y.o.   MRN: [id number]  HPI 74 year old gentleman here to follow-up diabetes and hypertension. He also has significant carotid artery stenosis and is followed by vascular surgeon and [facility]. He had what was felt to be a transient ischemic attack last month.  He monitors his sugars at home but not on a daily basis. Highest he has seen his around [phone] and lowest is around 100. Medication is only metformin  Of interest,. He has never taken any cholesterol-lowering drugs. He expressed some concern statins may cause cancer and I did my best to convince him that there is little if any evidence of this is true. I think he finally agreed it might be worthwhile to lower his LDL, bad cholesterol.  Patient Active Problem List   Diagnosis Date Noted  . Carotid artery occlusion without infarction 10/26/2014  . Carotid stenosis 10/26/2014  . Status post aortic valve replacement with bioprosthetic valve 07/05/2013  . Hyperlipidemia 07/05/2013  . Essential hypertension 07/05/2013  . CAD (coronary artery disease)   . DM (diabetes mellitus)    Outpatient Encounter Prescriptions as of 11/28/2014  Medication Sig  . amLODipine (NORVASC) 5 MG tablet Take 1 tablet (5 mg total) by mouth daily.  Marland Kitchen aspirin 325 MG tablet Take 650 mg by mouth daily. Take two once daily  . hydrochlorothiazide (HYDRODIURIL) 25 MG tablet Take 1 tablet (25 mg total) by mouth daily.  Marland Kitchen lisinopril (PRINIVIL,ZESTRIL) 40 MG tablet Take 1 tablet (40 mg total) by mouth daily.  . metFORMIN (GLUCOPHAGE) 1000 MG tablet Take 1 tablet (1,000 mg total) by mouth 2 (two) times daily with a meal.  . metoprolol succinate (TOPROL XL) 100 MG 24 hr tablet Take 1 tablet (100 mg total) by mouth daily.  . [DISCONTINUED] doxycycline (VIBRA-TABS) 100 MG tablet    No facility-administered encounter medications on file as of 11/28/2014.      Review of Systems  Constitutional:  Negative.   HENT: Negative.   Respiratory: Negative.   Cardiovascular: Negative.   Neurological: Positive for speech difficulty.       Objective:   Physical Exam  Constitutional: He is oriented to person, place, and time. He appears well-developed and well-nourished.  Neck: Normal range of motion. Neck supple.  Cardiovascular: Normal rate and regular rhythm.   Pulmonary/Chest: Effort normal and breath sounds normal.  Neurological: He is alert and oriented to person, place, and time. He has normal reflexes.    BP 139/75 mmHg  Pulse 59  Temp(Src) 97.9 F (36.6 C) (Oral)  Ht 5' 5"  (1.651 m)  Wt 176 lb (79.833 kg)  BMI 29.29 kg/m2        Assessment & Plan:  1. Hyperlipidemia As above we had a long discussion about use of statins. If LDL is still greater than 100 would be aggressive and try to achieve a target of around 70-75 - Lipid panel  2. Essential hypertension Blood pressure is probably okay looking at number from last month and today averages acceptable. Continue lisinopril  3. Type 1 diabetes mellitus without complication J9E was not quite at goal 5 months ago may make adjustments with her metformin depending on A1c level today - POCT glycosylated hemoglobin (Hb A1C) - POCT UA - Microalbumin  Wardell Honour MD

## 2014-11-29 LAB — LIPID PANEL
CHOL/HDL RATIO: 6.7 ratio — AB (ref 0.0–5.0)
Cholesterol, Total: 201 mg/dL — ABNORMAL HIGH (ref 100–199)
HDL: 30 mg/dL — ABNORMAL LOW (ref >39)
LDL Calculated: 116 mg/dL — ABNORMAL HIGH (ref 0–99)
TRIGLYCERIDES: 273 mg/dL — AB (ref 0–149)
VLDL Cholesterol Cal: 55 mg/dL — ABNORMAL HIGH (ref 5–40)

## 2014-11-30 ENCOUNTER — Ambulatory Visit: Payer: BC Managed Care – PPO | Admitting: Family Medicine

## 2014-12-04 ENCOUNTER — Ambulatory Visit: Payer: BLUE CROSS/BLUE SHIELD | Admitting: Neurology

## 2014-12-06 DIAGNOSIS — H4089 Other specified glaucoma: Secondary | ICD-10-CM | POA: Diagnosis not present

## 2014-12-06 DIAGNOSIS — Z961 Presence of intraocular lens: Secondary | ICD-10-CM | POA: Diagnosis not present

## 2014-12-06 DIAGNOSIS — H35372 Puckering of macula, left eye: Secondary | ICD-10-CM | POA: Diagnosis not present

## 2014-12-06 DIAGNOSIS — H2511 Age-related nuclear cataract, right eye: Secondary | ICD-10-CM | POA: Diagnosis not present

## 2015-01-17 DIAGNOSIS — H4089 Other specified glaucoma: Secondary | ICD-10-CM | POA: Diagnosis not present

## 2015-01-17 DIAGNOSIS — Z961 Presence of intraocular lens: Secondary | ICD-10-CM | POA: Diagnosis not present

## 2015-01-17 DIAGNOSIS — H2511 Age-related nuclear cataract, right eye: Secondary | ICD-10-CM | POA: Diagnosis not present

## 2015-02-04 DIAGNOSIS — H34232 Retinal artery branch occlusion, left eye: Secondary | ICD-10-CM | POA: Diagnosis not present

## 2015-02-04 DIAGNOSIS — E119 Type 2 diabetes mellitus without complications: Secondary | ICD-10-CM | POA: Diagnosis not present

## 2015-03-05 ENCOUNTER — Ambulatory Visit (INDEPENDENT_AMBULATORY_CARE_PROVIDER_SITE_OTHER): Payer: BLUE CROSS/BLUE SHIELD | Admitting: Family Medicine

## 2015-03-05 ENCOUNTER — Encounter: Payer: Self-pay | Admitting: Family Medicine

## 2015-03-05 VITALS — BP 127/71 | HR 66 | Temp 97.8°F | Wt 171.0 lb

## 2015-03-05 DIAGNOSIS — I10 Essential (primary) hypertension: Secondary | ICD-10-CM | POA: Diagnosis not present

## 2015-03-05 DIAGNOSIS — I6523 Occlusion and stenosis of bilateral carotid arteries: Secondary | ICD-10-CM

## 2015-03-05 DIAGNOSIS — E109 Type 1 diabetes mellitus without complications: Secondary | ICD-10-CM | POA: Diagnosis not present

## 2015-03-05 DIAGNOSIS — E785 Hyperlipidemia, unspecified: Secondary | ICD-10-CM

## 2015-03-05 LAB — POCT GLYCOSYLATED HEMOGLOBIN (HGB A1C): HEMOGLOBIN A1C: 7.2

## 2015-03-05 NOTE — Progress Notes (Signed)
   Subjective:    Patient ID: Evan Stanley, male    DOB: 04-Feb-1941, 74 y.o.   MRN: [id number]  HPI 74 year old male with diabetes hyperlipidemia carotid artery and coronary artery disease. This man does not like to take medicines and so we work with that as best we can. Blood pressure has been well controlled on amlodipine and lisinopril and sugars have been fairly well controlled but lipids remain problematic. Given his history would like to get LDL below 75   Patient Active Problem List   Diagnosis Date Noted  . Carotid artery occlusion without infarction 10/26/2014  . Carotid stenosis 10/26/2014  . Status post aortic valve replacement with bioprosthetic valve 07/05/2013  . Hyperlipidemia 07/05/2013  . Essential hypertension 07/05/2013  . CAD (coronary artery disease)   . DM (diabetes mellitus)    Outpatient Encounter Prescriptions as of 03/05/2015  Medication Sig  . amLODipine (NORVASC) 5 MG tablet Take 1 tablet (5 mg total) by mouth daily.  Marland Kitchen aspirin 325 MG tablet Take 650 mg by mouth daily. Take two once daily  . hydrochlorothiazide (HYDRODIURIL) 25 MG tablet Take 1 tablet (25 mg total) by mouth daily.  Marland Kitchen lisinopril (PRINIVIL,ZESTRIL) 40 MG tablet Take 1 tablet (40 mg total) by mouth daily.  . metFORMIN (GLUCOPHAGE) 1000 MG tablet Take 1 tablet (1,000 mg total) by mouth 2 (two) times daily with a meal.  . metoprolol succinate (TOPROL XL) 100 MG 24 hr tablet Take 1 tablet (100 mg total) by mouth daily.   No facility-administered encounter medications on file as of 03/05/2015.      Review of Systems  Constitutional: Negative.   HENT: Negative.   Respiratory: Negative.   Cardiovascular: Negative.   Genitourinary: Negative.   Psychiatric/Behavioral: Negative.        Objective:   Physical Exam  Constitutional: He is oriented to person, place, and time. He appears well-developed and well-nourished.  Cardiovascular: Normal rate and regular rhythm.   Has prosthetic aortic valve  and has aortic murmur.  Also has bruits right greater than left. He is said to have 50-60% blockage on the right and almost 100% on the left. (This is followed by cardiovascular surgeon)  Pulmonary/Chest: Effort normal and breath sounds normal.  Abdominal: Soft.  Musculoskeletal: Normal range of motion.  Neurological: He is oriented to person, place, and time.     BP 127/71 mmHg  Pulse 66  Temp(Src) 97.8 F (36.6 C) (Oral)  Wt 171 lb (77.565 kg)      Assessment & Plan:  1. Type 1 diabetes mellitus without complication Depending on A1c from today continue with metformin 1000 mg twice a day - POCT glycosylated hemoglobin (Hb A1C)  2. Essential hypertension Blood pressure is good at 127/71. Continue lisinopril and amlodipine  3. Hyperlipidemia LDL is suboptimal at 116 when last checked. HDL is also low and suggested he could try some niacin.  Wardell Honour MD

## 2015-04-21 ENCOUNTER — Other Ambulatory Visit: Payer: Self-pay | Admitting: Family Medicine

## 2015-05-03 ENCOUNTER — Other Ambulatory Visit: Payer: Self-pay | Admitting: Family Medicine

## 2015-05-07 ENCOUNTER — Encounter: Payer: Self-pay | Admitting: Vascular Surgery

## 2015-05-08 ENCOUNTER — Other Ambulatory Visit: Payer: Self-pay | Admitting: Family Medicine

## 2015-05-10 ENCOUNTER — Ambulatory Visit (HOSPITAL_COMMUNITY)
Admission: RE | Admit: 2015-05-10 | Discharge: 2015-05-10 | Disposition: A | Discharge status: Home / Self Care | Payer: BLUE CROSS/BLUE SHIELD | Source: Ambulatory Visit | Attending: Vascular Surgery | Admitting: Vascular Surgery

## 2015-05-10 ENCOUNTER — Encounter: Payer: Self-pay | Admitting: Family

## 2015-05-10 ENCOUNTER — Other Ambulatory Visit: Payer: Self-pay | Admitting: Vascular Surgery

## 2015-05-10 ENCOUNTER — Ambulatory Visit (INDEPENDENT_AMBULATORY_CARE_PROVIDER_SITE_OTHER): Payer: BLUE CROSS/BLUE SHIELD | Admitting: Family

## 2015-05-10 VITALS — BP 149/75 | HR 56 | Temp 99.0°F | Resp 18 | Ht 67.0 in | Wt 174.0 lb

## 2015-05-10 DIAGNOSIS — E119 Type 2 diabetes mellitus without complications: Secondary | ICD-10-CM | POA: Insufficient documentation

## 2015-05-10 DIAGNOSIS — I6522 Occlusion and stenosis of left carotid artery: Secondary | ICD-10-CM | POA: Diagnosis not present

## 2015-05-10 DIAGNOSIS — I6521 Occlusion and stenosis of right carotid artery: Secondary | ICD-10-CM

## 2015-05-10 DIAGNOSIS — E785 Hyperlipidemia, unspecified: Secondary | ICD-10-CM | POA: Insufficient documentation

## 2015-05-10 DIAGNOSIS — I10 Essential (primary) hypertension: Secondary | ICD-10-CM | POA: Insufficient documentation

## 2015-05-10 DIAGNOSIS — I6523 Occlusion and stenosis of bilateral carotid arteries: Secondary | ICD-10-CM

## 2015-05-10 NOTE — Progress Notes (Signed)
Established Carotid Patient   History of Present Illness  Evan Stanley is a 74 y.o. male patient of Dr. Bridgett Larsson who presents for follow up of carotid artery stenosis.  This patient previous has had an episode of left eye visual field loss, about 2014, which was attributed to stroke. He had a <1 hour episode of slurring of speech about April 2016. HCT at that point was not diagnostic. He saw his cardiologist in April 2016 for B carotid studies which demonstrated: RICA >13% stenosis, LICA: occluded. The patient has never had amaurosis fugax but has developed partial visual field loss. The patient has never had facial drooping or hemiplegia. The patient has had expressive aphasia. The patient's previous neurologic deficits have resolved and he denies any subsequent stroke or TIA symptoms. The patient's risks factors for carotid disease include: DM, HLD, HTN, afib. Dr. Bridgett Larsson last saw pt on 11/02/14. At that time pt had asx R ICA stenosis 60%, L ICA occlusion by CTA. The technique of the CTA appeared to be good, so Dr. Bridgett Larsson was inclined to believe the CTA more than the carotid duplex which is dependent on operator expertise.Based on the Dr. Bridgett Larsson offered the patient: maximal medical mgmt and follow in 6 months with B carotid duplex. If velocities in R ICA continue to be elevated, carotid angiography might be necessary to determine exact level of the disease, given the surgical difficulty with placement of shunt with a high lesion.  The patient denies New Medical or Surgical History.  Pt Diabetic: yes, 7.2 A1C August 2016 (review of records) Pt smoker: former smoker, quit in 1988  Pt meds include: Statin : no ASA: yes, 650 mg daily Other anticoagulants/antiplatelets: no   Past Medical History  Diagnosis Date  . Diabetes mellitus without complication   . Hyperlipidemia   . Hypertension   . CHF (congestive heart failure)   . Anxiety   . Atrial fibrillation   . Carotid artery stenosis   .  TIA (transient ischemic attack)     Social History Social History  Substance Use Topics  . Smoking status: Former Smoker    Quit date: 07/20/1984  . Smokeless tobacco: Never Used  . Alcohol Use: No    Family History Family History  Problem Relation Age of Onset  . Heart disease Mother   . Stroke Father 25    Surgical History Past Surgical History  Procedure Laterality Date  . Cardiac valve replacement    . Aortic valve replacement    . Back surgery      No Known Allergies  Current Outpatient Prescriptions  Medication Sig Dispense Refill  . amLODipine (NORVASC) 5 MG tablet Take 1 tablet (5 mg total) by mouth daily. 90 tablet 3  . aspirin 325 MG tablet Take 650 mg by mouth daily. Take two once daily    . hydrochlorothiazide (HYDRODIURIL) 25 MG tablet TAKE 1 TABLET DAILY 90 tablet 0  . lisinopril (PRINIVIL,ZESTRIL) 40 MG tablet TAKE 1 TABLET DAILY 90 tablet 1  . metFORMIN (GLUCOPHAGE) 1000 MG tablet TAKE 1 TABLET TWICE A DAY WITH MEALS 180 tablet 0  . metoprolol succinate (TOPROL-XL) 100 MG 24 hr tablet TAKE 1 TABLET DAILY 90 tablet 0   No current facility-administered medications for this visit.    Review of Systems : See HPI for pertinent positives and negatives.  Physical Examination  Filed Vitals:   05/10/15 1043 05/10/15 1045 05/10/15 1046  BP: 161/69 146/75 149/75  Pulse: 64 56 56  Temp: 99 F (  37.2 C)    Resp: 18    Height: 5' 7"  (1.702 m)    Weight: 174 lb (78.926 kg)    SpO2: 98%     Body mass index is 27.25 kg/(m^2).  General: A&O x 3, WDWN  Head: [place]/AT  Eyes: PERRLA  Pulmonary: Sym exp, good air movt, CTAB, no rales, rhonchi, & wheezing  Cardiac: RRR, Nl S1, S2, no detected murmur  Vascular: Vessel Right Left  Radial Palpable Palpable  Brachial Palpable Palpable  Carotid Palpable, without bruit Palpable, without bruit  Aorta Not palpable N/A  Femoral Palpable Palpable  Popliteal Not palpable Not palpable  PT  Not Palpable Not Palpable  DP Not Palpable Palpable   Gastrointestinal: soft, NTND, -G/R, - HSM, - palpable masses, - CVAT B  Musculoskeletal: M/S 5/5 throughout , extremities without ischemic changes   Neurologic: CN 2-12 intact, pain and light touch intact in extremities, motor exam as listed above  Psychiatric: Judgment intact, Mood & affect appropriate for pt's clinical situation  Dermatologic: See M/S exam for extremity exam, no rashes otherwise noted        11/02/14 CTA neck: Based on the CTA, the L ICA is completed occluded without string sign. The R ICA has 2 mm lumen putting the stenosis <80%. R ICA stenosis is near angle of jaw on A-P reconstruction.   Non-Invasive Vascular Imaging CAROTID DUPLEX 05/10/2015   CEREBROVASCULAR DUPLEX EVALUATION    INDICATION: Carotid artery stenosis    PREVIOUS INTERVENTION(S): NA    DUPLEX EXAM:     RIGHT  LEFT  Peak Systolic Velocities (cm/s) End Diastolic Velocities (cm/s) Plaque LOCATION Peak Systolic Velocities (cm/s) End Diastolic Velocities (cm/s) Plaque  91 20  CCA PROXIMAL 91 16   67 18 HT CCA MID 134 25   79 25 HT CCA DISTAL 70 17 HT  114 13 HT ECA 235 42 HT  516 149 HT ICA PROXIMAL 0 0 occluded  317 97  ICA MID 0 0 occluded  74 19  ICA DISTAL 0 0 occluded    7.70 ICA / CCA Ratio (PSV) ICA occlusion  Antegrade Vertebral Flow Antegrade  NA Brachial Systolic Pressure (mmHg) NA  NA Brachial Artery Waveforms NA    Plaque Morphology:  HM = Homogeneous, HT = Heterogeneous, CP = Calcific Plaque, SP = Smooth Plaque, IP = Irregular Plaque     ADDITIONAL FINDINGS: Right subclavian artery PSV105 cm/sec; Left subclavian artery PSV168cm/sec    IMPRESSION: Right internal carotid artery stenosis present in the 80%-99% range. No color or spectral Doppler waveform could be obtained involving the left internal carotid artery suggestive of vessel occlusion, string sign could not be ruled out due to limitations of  ultrasound. Left external carotid artery stenosis present, heterogeneous plaque visualized.    Compared to the previous exam:  Essentially unchanged since previous study on 10/26/2014.      Assessment: Evan Stanley is a 74 y.o. male who had an episode of left eye visual field loss, about 2014, which was attributed to stroke. He had a <1 hour episode of slurring of speech about April 2016. Head CT at that point was not diagnostic.  Highest EDV today in the right ICA is 149, compared to 157 on 10/26/14 carotid duplex at [facility]; at that same time CTA of head demonstrated 60% right ICA stenosis.  I discussed with Dr. Bridgett Larsson pt HPI, exam, and carotid duplex results today. Velocities are falsely elevated due to compensatory flow as a result of the occluded  left ICA. He had a 60% stenosis by CTA in April 2016 with no subsequent stroke or TIA.  Plan: Follow-up in 6 months with Carotid Duplex scan.   I discussed in depth with the patient the nature of atherosclerosis, and emphasized the importance of maximal medical management including strict control of blood pressure, blood glucose, and lipid levels, obtaining regular exercise, and continued cessation of smoking.  The patient is aware that without maximal medical management the underlying atherosclerotic disease process will progress, limiting the benefit of any interventions. The patient was given information about stroke prevention and what symptoms should prompt the patient to seek immediate medical care. Thank you for allowing [place] to participate in this patient's care.  Clemon Chambers, RN, MSN, FNP-C [facility] Office: [phone]  Clinic Physician: Bridgett Larsson  05/10/2015 10:56 AM

## 2015-05-10 NOTE — Patient Instructions (Signed)
Stroke Prevention Some medical conditions and behaviors are associated with an increased chance of having a stroke. You may prevent a stroke by making healthy choices and managing medical conditions. HOW CAN I REDUCE MY RISK OF HAVING A STROKE?   Stay physically active. Get at least 30 minutes of activity on most or all days.  Do not smoke. It may also be helpful to avoid exposure to secondhand smoke.  Limit alcohol use. Moderate alcohol use is considered to be:  No more than 2 drinks per day for men.  No more than 1 drink per day for nonpregnant women.  Eat healthy foods. This involves:  Eating 5 or more servings of fruits and vegetables a day.  Making dietary changes that address high blood pressure (hypertension), high cholesterol, diabetes, or obesity.  Manage your cholesterol levels.  Making food choices that are high in fiber and low in saturated fat, trans fat, and cholesterol may control cholesterol levels.  Take any prescribed medicines to control cholesterol as directed by your health care provider.  Manage your diabetes.  Controlling your carbohydrate and sugar intake is recommended to manage diabetes.  Take any prescribed medicines to control diabetes as directed by your health care provider.  Control your hypertension.  Making food choices that are low in salt (sodium), saturated fat, trans fat, and cholesterol is recommended to manage hypertension.  Ask your health care provider if you need treatment to lower your blood pressure. Take any prescribed medicines to control hypertension as directed by your health care provider.  If you are 18-39 years of age, have your blood pressure checked every 3-5 years. If you are 40 years of age or older, have your blood pressure checked every year.  Maintain a healthy weight.  Reducing calorie intake and making food choices that are low in sodium, saturated fat, trans fat, and cholesterol are recommended to manage  weight.  Stop drug abuse.  Avoid taking birth control pills.  Talk to your health care provider about the risks of taking birth control pills if you are over 35 years old, smoke, get migraines, or have ever had a blood clot.  Get evaluated for sleep disorders (sleep apnea).  Talk to your health care provider about getting a sleep evaluation if you snore a lot or have excessive sleepiness.  Take medicines only as directed by your health care provider.  For some people, aspirin or blood thinners (anticoagulants) are helpful in reducing the risk of forming abnormal blood clots that can lead to stroke. If you have the irregular heart rhythm of atrial fibrillation, you should be on a blood thinner unless there is a good reason you cannot take them.  Understand all your medicine instructions.  Make sure that other conditions (such as anemia or atherosclerosis) are addressed. SEEK IMMEDIATE MEDICAL CARE IF:   You have sudden weakness or numbness of the face, arm, or leg, especially on one side of the body.  Your face or eyelid droops to one side.  You have sudden confusion.  You have trouble speaking (aphasia) or understanding.  You have sudden trouble seeing in one or both eyes.  You have sudden trouble walking.  You have dizziness.  You have a loss of balance or coordination.  You have a sudden, severe headache with no known cause.  You have new chest pain or an irregular heartbeat. Any of these symptoms may represent a serious problem that is an emergency. Do not wait to see if the symptoms will   go away. Get medical help at once. Call your local emergency services (911 in [place] Do not drive yourself to the hospital.   This information is not intended to replace advice given to you by your health care provider. Make sure you discuss any questions you have with your health care provider.   Document Released: 08/13/2004 Document Revised: 07/27/2014 Document Reviewed:  01/06/2013 Elsevier Interactive Patient Education 2016 Elsevier Inc.  

## 2015-05-13 NOTE — Addendum Note (Signed)
Addended by: Dorthula Rue L on: 05/13/2015 09:38 AM   Modules accepted: Orders

## 2015-05-24 ENCOUNTER — Other Ambulatory Visit: Payer: Self-pay | Admitting: Family Medicine

## 2015-06-12 ENCOUNTER — Ambulatory Visit: Payer: BLUE CROSS/BLUE SHIELD | Admitting: Family Medicine

## 2015-06-18 ENCOUNTER — Ambulatory Visit (INDEPENDENT_AMBULATORY_CARE_PROVIDER_SITE_OTHER): Payer: BLUE CROSS/BLUE SHIELD | Admitting: Family Medicine

## 2015-06-18 ENCOUNTER — Encounter: Payer: Self-pay | Admitting: Family Medicine

## 2015-06-18 VITALS — BP 141/70 | HR 58 | Temp 97.1°F | Ht 67.0 in | Wt 173.2 lb

## 2015-06-18 DIAGNOSIS — Z23 Encounter for immunization: Secondary | ICD-10-CM | POA: Diagnosis not present

## 2015-06-18 DIAGNOSIS — I6523 Occlusion and stenosis of bilateral carotid arteries: Secondary | ICD-10-CM

## 2015-06-18 DIAGNOSIS — E109 Type 1 diabetes mellitus without complications: Secondary | ICD-10-CM | POA: Diagnosis not present

## 2015-06-18 LAB — POCT GLYCOSYLATED HEMOGLOBIN (HGB A1C): Hemoglobin A1C: 7.2

## 2015-06-18 NOTE — Progress Notes (Signed)
   Subjective:    Patient ID: Evan Stanley, male    DOB: Apr 02, 1941, 74 y.o.   MRN: [id number]  HPI 74 year old gentleman here to follow-up hypertension and diabetes. Last hemoglobin A1c was 7.23 months ago. Lipids showed LDL cholesterol 116. He denies any symptoms or complaints today  Patient Active Problem List   Diagnosis Date Noted  . Carotid artery occlusion without infarction 10/26/2014  . Carotid stenosis 10/26/2014  . Status post aortic valve replacement with bioprosthetic valve 07/05/2013  . Hyperlipidemia 07/05/2013  . Essential hypertension 07/05/2013  . CAD (coronary artery disease)   . DM (diabetes mellitus) [facility])    Outpatient Encounter Prescriptions as of 06/18/2015  Medication Sig  . amLODipine (NORVASC) 5 MG tablet TAKE 1 TABLET DAILY  . aspirin 325 MG tablet Take 650 mg by mouth daily. Take two once daily  . hydrochlorothiazide (HYDRODIURIL) 25 MG tablet TAKE 1 TABLET DAILY  . lisinopril (PRINIVIL,ZESTRIL) 40 MG tablet TAKE 1 TABLET DAILY  . metFORMIN (GLUCOPHAGE) 1000 MG tablet TAKE 1 TABLET TWICE A DAY WITH MEALS  . metoprolol succinate (TOPROL-XL) 100 MG 24 hr tablet TAKE 1 TABLET DAILY   No facility-administered encounter medications on file as of 06/18/2015.      Review of Systems  Constitutional: Negative.   HENT: Negative.   Eyes: Negative.   Respiratory: Negative.  Negative for shortness of breath.   Cardiovascular: Negative.  Negative for chest pain and leg swelling.  Gastrointestinal: Negative.   Genitourinary: Negative.   Musculoskeletal: Negative.   Skin: Negative.   Neurological: Negative.   Psychiatric/Behavioral: Negative.   All other systems reviewed and are negative.      Objective:   Physical Exam  Constitutional: He is oriented to person, place, and time. He appears well-developed and well-nourished.  HENT:  Head: Normocephalic.  Cardiovascular: Normal rate and regular rhythm.   There is a history of valve surgery. He also has  carotid artery disease. I cannot palpate left carotid. He is followed by vascular surgery for this problem  Pulmonary/Chest: Effort normal and breath sounds normal.  Neurological: He is alert and oriented to person, place, and time.  Psychiatric: He has a normal mood and affect. His behavior is normal.          Assessment & Plan:  1. Type 1 diabetes mellitus without complication (HCC) Continue with metformin 1000 mg twice a day. As long as A1c is around 7 I am content with management. - POCT glycosylated hemoglobin (Hb A1C)  Wardell Honour MD

## 2015-07-18 DIAGNOSIS — H2511 Age-related nuclear cataract, right eye: Secondary | ICD-10-CM | POA: Diagnosis not present

## 2015-07-18 DIAGNOSIS — Z961 Presence of intraocular lens: Secondary | ICD-10-CM | POA: Diagnosis not present

## 2015-07-18 DIAGNOSIS — H4089 Other specified glaucoma: Secondary | ICD-10-CM | POA: Diagnosis not present

## 2015-08-02 ENCOUNTER — Other Ambulatory Visit: Payer: Self-pay | Admitting: Family Medicine

## 2015-08-04 ENCOUNTER — Other Ambulatory Visit: Payer: Self-pay | Admitting: Family Medicine

## 2015-08-16 ENCOUNTER — Ambulatory Visit (INDEPENDENT_AMBULATORY_CARE_PROVIDER_SITE_OTHER): Payer: BLUE CROSS/BLUE SHIELD | Admitting: Interventional Cardiology

## 2015-08-16 ENCOUNTER — Encounter: Payer: Self-pay | Admitting: Interventional Cardiology

## 2015-08-16 ENCOUNTER — Telehealth: Payer: Self-pay | Admitting: *Deleted

## 2015-08-16 VITALS — BP 158/78 | HR 60 | Ht 67.0 in | Wt 176.0 lb

## 2015-08-16 DIAGNOSIS — Z954 Presence of other heart-valve replacement: Secondary | ICD-10-CM

## 2015-08-16 DIAGNOSIS — E785 Hyperlipidemia, unspecified: Secondary | ICD-10-CM

## 2015-08-16 DIAGNOSIS — G459 Transient cerebral ischemic attack, unspecified: Secondary | ICD-10-CM

## 2015-08-16 DIAGNOSIS — Z953 Presence of xenogenic heart valve: Secondary | ICD-10-CM

## 2015-08-16 DIAGNOSIS — I25810 Atherosclerosis of coronary artery bypass graft(s) without angina pectoris: Secondary | ICD-10-CM | POA: Diagnosis not present

## 2015-08-16 DIAGNOSIS — I10 Essential (primary) hypertension: Secondary | ICD-10-CM

## 2015-08-16 DIAGNOSIS — I6522 Occlusion and stenosis of left carotid artery: Secondary | ICD-10-CM

## 2015-08-16 MED ORDER — AMLODIPINE BESYLATE 10 MG PO TABS: 10.0000 mg | ORAL_TABLET | Freq: Every day | ORAL | Status: DC

## 2015-08-16 MED ORDER — ASPIRIN EC 81 MG PO TBEC: 81.0000 mg | DELAYED_RELEASE_TABLET | Freq: Every day | ORAL | Status: DC

## 2015-08-16 NOTE — Telephone Encounter (Signed)
We need to refer to lipid clinic

## 2015-08-16 NOTE — Progress Notes (Signed)
Cardiology Office Note   Date:  08/16/2015   ID:  Sedale Jenifer, DOB [birth year], MRN [id number]  PCP:  Wardell Honour, MD  Cardiologist:  Sinclair Grooms, MD   Chief Complaint  Patient presents with  . Cardiac Valve Problem      History of Present Illness: Zyire Eidson is a 75 y.o. male who presents for bioprosthetic aortic valve, coronary artery disease with prior bypass grafting, left ventricular systolic dysfunction.  He is doing well. He can't see out of the left eye. He has total occlusion of the left carotid. He denies angina. He has not had dyspnea or syncope. He has occasional dizziness.   Past Medical History  Diagnosis Date  . Diabetes mellitus without complication ([place])   . Hyperlipidemia   . Hypertension   . CHF (congestive heart failure) ([place])   . Anxiety   . Atrial fibrillation ([place])   . Carotid artery stenosis   . TIA (transient ischemic attack)     Past Surgical History  Procedure Laterality Date  . Cardiac valve replacement    . Aortic valve replacement    . Back surgery       Current Outpatient Prescriptions  Medication Sig Dispense Refill  . hydrochlorothiazide (HYDRODIURIL) 25 MG tablet TAKE 1 TABLET DAILY 90 tablet 1  . lisinopril (PRINIVIL,ZESTRIL) 40 MG tablet TAKE 1 TABLET DAILY 90 tablet 1  . metFORMIN (GLUCOPHAGE) 1000 MG tablet TAKE 1 TABLET TWICE A DAY WITH MEALS 180 tablet 0  . metoprolol succinate (TOPROL-XL) 100 MG 24 hr tablet TAKE 1 TABLET DAILY 90 tablet 0  . [DISCONTINUED] amLODipine (NORVASC) 5 MG tablet TAKE 1 TABLET DAILY 90 tablet 1   No current facility-administered medications for this visit.    Allergies:   Review of patient's allergies indicates no known allergies.    Social History:  The patient  reports that he quit smoking about 31 years ago. He has never used smokeless tobacco. He reports that he does not drink alcohol or use illicit drugs.   Family History:  The patient's family history includes Heart  disease in his mother; Stroke (age of onset: 84) in his father.    ROS:  Please see the history of present illness.   Otherwise, review of systems are positive for non-orthostatic dizziness. Blurred left eye vision..   All other systems are reviewed and negative.    PHYSICAL EXAM: VS:  BP 158/78 mmHg  Pulse 60  Ht 5' 7"  (1.702 m)  Wt 176 lb (79.833 kg)  BMI 27.56 kg/m2 , BMI Body mass index is 27.56 kg/(m^2). GEN: Well nourished, well developed, in no acute distress HEENT: normal Neck: no JVD, carotid bruits, or masses Cardiac: RRR.  There is 2 to 3/6 crescendo decrescendo systolic right upper sternal border murmur. No rub, or gallop. There is none edema. Respiratory:  clear to auscultation bilaterally, normal work of breathing. GI: soft, nontender, nondistended, + BS MS: no deformity or atrophy Skin: warm and dry, no rash Neuro:  Strength and sensation are intact Psych: euthymic mood, full affect   EKG:  EKG is not ordered today. April 2016 revealed normal sinus rhythm and nonspecific ST abnormality.   Recent Labs: 10/22/2014: ALT 29; BUN 16; Creatinine, Ser 1.36*; Hemoglobin 15.4; Platelets 215; Potassium 4.4; Sodium 141    Lipid Panel    Component Value Date/Time   CHOL 201* 11/28/2014 0900   TRIG 273* 11/28/2014 0900   HDL 30* 11/28/2014 0900   CHOLHDL 6.7* 11/28/2014  0900   LDLCALC 116* 11/28/2014 0900      Wt Readings from Last 3 Encounters:  08/16/15 176 lb (79.833 kg)  06/18/15 173 lb 3.2 oz (78.563 kg)  05/10/15 174 lb (78.926 kg)      Other studies Reviewed: Additional studies/ records that were reviewed today include: Recent entries into the electronic chart were reviewed.. The findings include lipids done in May 2016 revealed total cholesterol of 201, LDL of 116.    ASSESSMENT AND PLAN:  1. Coronary artery disease involving coronary bypass graft of native heart without angina pectoris Asymptomatic  2. Essential hypertension Elevated. Adjustments  will be made. Please see below.  3. Hyperlipidemia Followed by primary care. No recent data. LDL is inadequately controlled. Needs to start statin therapy. He has coronary disease and diabetes. LDL goal less than 70.  4. Status post aortic valve replacement with bioprosthetic valve Systolic murmur. No evidence of clinical dysfunction.  5. Carotid artery occlusion without infarction, left Known total occlusion of left carotid. Blindness left eye. Followed by Dr. Geryl Councilman  6. Transient cerebral ischemia, unspecified transient cerebral ischemia type As above    Current medicines are reviewed at length with the patient today.  The patient has the following concerns regarding medicines: Aspirin discussed  .  The following changes/actions have been instituted:    Echocardiogram need to be done within this next year  Decrease aspirin 81 mg per day  Increase amlodipine to 10 mg per day  Blood pressure check by APP in one month. Consider ordering an echocardiogram at that time  Atorvastatin 20 mg per day should be started. Fasting lipid panel with liver panel in 6-8 weeks.  Labs/ tests ordered today include:  No orders of the defined types were placed in this encounter.     Disposition:   FU with HS in 1 year  Signed, Sinclair Grooms, MD  08/16/2015 8:34 AM    [facility] [place], [place], [place]  [address] Phone: [phone]; Fax: [phone]

## 2015-08-16 NOTE — Patient Instructions (Signed)
Your physician has recommended you make the following change in your medication:  1.) CHANGE ASPIRIN TO 81 MG DAILY 2.) INCREASE AMLODIPINE (NORVASC) TO 10 MG DAILY  Your physician recommends that you schedule a follow-up appointment in: [place] EXTENDER (BLOOD PRESSURE)  Your physician wants you to follow-up in: [place].  You will receive a reminder letter in the mail two months in advance. If you don't receive a letter, please call our office to schedule the follow-up appointment.

## 2015-08-16 NOTE — Telephone Encounter (Signed)
Called patient per Dr. Lauralyn Primes was seen in [place] today--pt needs to be on Lipitor for lipids that are above goal of 70. Lipitor 20 mg daily, lipids/liver in 6-8 weeks  Patient is very resistant to adding this medication. I explained the benefits and his risks with his DM and heart disease and carotid disease.   He stateed I'm forcing the medication on him.  I asked him to think about it and I will ask Dr. Thompson Caul covering CMA to call him next week to discuss.

## 2015-08-19 NOTE — Telephone Encounter (Signed)
Spoke with pt for greater than 15 min. Pt still does not want to start Atorvastatin. Pt aware of Dr.Mccarrick's recommendation. To ref him to the lipid clinic. Pt refused. Adv pt of the risk of elevated lipids, and his other risk factors as it relates to the progression of CAD. Pt became upset every time I mentioned "risk factors" adv pt that I am just wanting him to understand his risk. He stated that he does. Pt sts that he is scheduled for f/u with his pcp in March 2017. He will discuss his lipid management with his pcp. Adv pt that I will fwd an update to Dr.Mcinerny. Pt verbalized understanding.

## 2015-09-12 NOTE — Progress Notes (Signed)
Cardiology Office Note:    Date:  09/13/2015   ID:  Evan Stanley, DOB [birth year], MRN [id number]  PCP:  Evan Honour, MD  Cardiologist:  Dr. Daneen Stanley   Electrophysiologist:  n/a  Chief Complaint  Patient presents with  . Follow-up    Hypertension    History of Present Illness:     Evan Stanley is a 75 y.o. male with a hx of mod non-obstructive CAD, aortic stenosis s/p bioprosthetic AVR in 7/67, diastolic HF, HTN, HL, prior TIA, carotid stenosis.   Carotid US in 3/41 with occluded LICA and high grade RICA stenosis.  He has been followed by [facility] (Dr. Bridgett Stanley).  CTA demonstrated RICA 93% and LICA 790% stenosis.   This is being followed with serial duplex studies.    Last seen by Dr. Tamala Stanley 1/17.  FU echo recommended in the next year.  BP meds adjusted.  Statin Rx recommended.   He returns for follow-up. The patient denies any chest pain, significant dyspnea, syncope, orthopnea, PND, edema.   Past Medical History  Diagnosis Date  . Diabetes mellitus without complication (Evan Stanley)   . Hyperlipidemia   . Hypertension   . CHF (congestive heart failure) (Evan Stanley)   . Anxiety   . Atrial fibrillation (Evan Stanley)   . Carotid artery stenosis   . TIA (transient ischemic attack)     Past Surgical History  Procedure Laterality Date  . Cardiac valve replacement    . Aortic valve replacement    . Back surgery      Current Medications: Outpatient Prescriptions Prior to Visit  Medication Sig Dispense Refill  . aspirin EC 81 MG tablet Take 1 tablet (81 mg total) by mouth daily. 90 tablet 3  . lisinopril (PRINIVIL,ZESTRIL) 40 MG tablet TAKE 1 TABLET DAILY 90 tablet 1  . metFORMIN (GLUCOPHAGE) 1000 MG tablet TAKE 1 TABLET TWICE A DAY WITH MEALS 180 tablet 0  . metoprolol succinate (TOPROL-XL) 100 MG 24 hr tablet TAKE 1 TABLET DAILY 90 tablet 0  . hydrochlorothiazide (HYDRODIURIL) 25 MG tablet TAKE 1 TABLET DAILY 90 tablet 1  . amLODipine (NORVASC) 10 MG tablet Take 1 tablet (10 mg total) by mouth  daily. (Patient not taking: Reported on 09/13/2015) 90 tablet 3   No facility-administered medications prior to visit.     Allergies:   Review of patient's allergies indicates no known allergies.   Social History   Social History  . Marital Status: Married    Spouse Name: N/A  . Number of Children: N/A  . Years of Education: N/A   Social History Main Topics  . Smoking status: Former Smoker    Quit date: 07/20/1984  . Smokeless tobacco: Never Used  . Alcohol Use: No  . Drug Use: No  . Sexual Activity: Not Asked   Other Topics Concern  . None   Social History Narrative     Family History:  The patient's family history includes Heart disease in his mother; Stroke (age of onset: 59) in his father.   ROS:   Please see the history of present illness.    ROS All other systems reviewed and are negative.   Physical Exam:    VS:  BP 150/70 mmHg  Pulse 62  Ht 5' 6"  (1.676 m)  Wt 177 lb 1.9 oz (80.341 kg)  BMI 28.60 kg/m2   GEN: Well nourished, well developed, in no acute distress HEENT: normal Neck: no JVD, no masses Cardiac: Normal W4/O9, RRR; 2/6 systolic  Murmur RUSB,  no edema   Respiratory:  clear to auscultation bilaterally; no wheezing, rhonchi or rales GI: soft, nontender  MS: no deformity or atrophy Skin: warm and dry,  Neuro:    no focal deficits  Psych: Alert and oriented x 3, normal affect  Wt Readings from Last 3 Encounters:  09/13/15 177 lb 1.9 oz (80.341 kg)  08/16/15 176 lb (79.833 kg)  06/18/15 173 lb 3.2 oz (78.563 kg)      Studies/Labs Reviewed:     EKG:  EKG is  ordered today.  The ekg ordered today demonstrates NSR, HR 65, normal axis, nonspecific ST-T wave changes, QTc 418 ms  Recent Labs: 10/22/2014: ALT 29; BUN 16; Creatinine, Ser 1.36*; Hemoglobin 15.4; Platelets 215; Potassium 4.4; Sodium 141   Recent Lipid Panel    Component Value Date/Time   CHOL 201* 11/28/2014 0900   TRIG 273* 11/28/2014 0900   HDL 30* 11/28/2014 0900    CHOLHDL 6.7* 11/28/2014 0900   LDLCALC 116* 11/28/2014 0900    Additional studies/ records that were reviewed today include:   Carotid US 10/16 R 80-99%  LHC 4/10 EF is 55%.  Left main: Left main was short, but widely patent. LAD: mid less than 50% narrowing. LCx:  50% midvessel, OM3 50-70% RCA: mid 40-50%    ASSESSMENT:     1. Essential hypertension   2. Hyperlipidemia   3. Coronary artery disease involving coronary bypass graft of native heart without angina pectoris   4. Carotid stenosis, right   5. S/P AVR (aortic valve replacement)     PLAN:     In order of problems listed above:  1. HTN - Blood pressure is much better controlled. Blood pressure readings at home are close to optimal. He is not quite yet at target. He is on maximum dose HCTZ, amlodipine, lisinopril. I do not think he can tolerate more metoprolol succinate. At this point, I have recommended adding Hytrin 1 mg daily at bedtime. I warned him of the possibility of orthostatic hypotension at night. I will see him back in 3 months for follow-up.  2. HL- Now on Pravastatin.  3. CAD - No angina.  Continue ASA, statin, beta-blocker.  4. R Carotid stenosis - FU with [facility] as planned.  Continue ASA, statin  5. S/p AVR - Continue SBE prophylaxis.  FU echo will be arranged before his next visit in 1 year.     Medication Adjustments/Labs and Tests Ordered: Current medicines are reviewed at length with the patient today.  Concerns regarding medicines are outlined above.  Medication changes, Labs and Tests ordered today are outlined in the Patient Instructions noted below. Patient Instructions  Medication Instructions:  START Hytrin (Terazosin) 1 mg daily at bedtime. Make sure you lay down for 2-3 hours after you take the first 2-3 doses.  When you start this medication, the first few doses can make your blood pressure drop quickly when you stand.  After you are on it for a while, that resolves.    Labwork: None today  Testing/Procedures: We will request that an Echocardiogram is done prior to your next visit with Dr. Daneen Stanley next year.  Follow-Up: Richardson Dopp, PA-C 3 months.   Any Other Special Instructions Will Be Listed Below (If Applicable). Check you blood pressure daily and call Richardson Dopp, PA-C with the readings after 2-3 weeks.  If you need a refill on your cardiac medications before your next appointment, please call your pharmacy.    Signed, Richardson Dopp, PA-C  09/13/2015 1:29 PM    [facility] [address], [place], [place]  [address] Phone: [phone]; Fax: [phone]

## 2015-09-13 ENCOUNTER — Encounter: Payer: Self-pay | Admitting: Physician Assistant

## 2015-09-13 ENCOUNTER — Ambulatory Visit (INDEPENDENT_AMBULATORY_CARE_PROVIDER_SITE_OTHER): Payer: BLUE CROSS/BLUE SHIELD | Admitting: Physician Assistant

## 2015-09-13 VITALS — BP 150/70 | HR 62 | Ht 66.0 in | Wt 177.1 lb

## 2015-09-13 DIAGNOSIS — I25810 Atherosclerosis of coronary artery bypass graft(s) without angina pectoris: Secondary | ICD-10-CM

## 2015-09-13 DIAGNOSIS — I6521 Occlusion and stenosis of right carotid artery: Secondary | ICD-10-CM

## 2015-09-13 DIAGNOSIS — E785 Hyperlipidemia, unspecified: Secondary | ICD-10-CM | POA: Diagnosis not present

## 2015-09-13 DIAGNOSIS — I10 Essential (primary) hypertension: Secondary | ICD-10-CM | POA: Diagnosis not present

## 2015-09-13 DIAGNOSIS — Z954 Presence of other heart-valve replacement: Secondary | ICD-10-CM

## 2015-09-13 DIAGNOSIS — Z952 Presence of prosthetic heart valve: Secondary | ICD-10-CM

## 2015-09-13 MED ORDER — AMLODIPINE BESYLATE 10 MG PO TABS: 10.0000 mg | ORAL_TABLET | Freq: Every day | ORAL | Status: DC

## 2015-09-13 MED ORDER — TERAZOSIN HCL 1 MG PO CAPS: 1.0000 mg | ORAL_CAPSULE | Freq: Every day | ORAL | Status: DC

## 2015-09-13 MED ORDER — HYDROCHLOROTHIAZIDE 25 MG PO TABS: 25.0000 mg | ORAL_TABLET | Freq: Every day | ORAL | Status: DC

## 2015-09-13 NOTE — Patient Instructions (Signed)
Medication Instructions:  START Hytrin (Terazosin) 1 mg daily at bedtime. Make sure you lay down for 2-3 hours after you take the first 2-3 doses.  When you start this medication, the first few doses can make your blood pressure drop quickly when you stand.  After you are on it for a while, that resolves.   Labwork: None today  Testing/Procedures: We will request that an Echocardiogram is done prior to your next visit with Dr. Daneen Schick next year.  Follow-Up: Richardson Dopp, PA-C 3 months.   Any Other Special Instructions Will Be Listed Below (If Applicable). Check you blood pressure daily and call Richardson Dopp, PA-C with the readings after 2-3 weeks.  If you need a refill on your cardiac medications before your next appointment, please call your pharmacy.

## 2015-09-17 ENCOUNTER — Other Ambulatory Visit: Payer: Self-pay | Admitting: *Deleted

## 2015-09-17 DIAGNOSIS — I10 Essential (primary) hypertension: Secondary | ICD-10-CM

## 2015-09-17 MED ORDER — METOPROLOL SUCCINATE ER 100 MG PO TB24: 100.0000 mg | ORAL_TABLET | Freq: Every day | ORAL | Status: DC

## 2015-09-17 MED ORDER — METFORMIN HCL 1000 MG PO TABS: 1000.0000 mg | ORAL_TABLET | Freq: Two times a day (BID) | ORAL | Status: DC

## 2015-09-17 MED ORDER — HYDROCHLOROTHIAZIDE 25 MG PO TABS
25.0000 mg | ORAL_TABLET | Freq: Every day | ORAL | Status: DC
Start: 2015-09-17 — End: 2015-12-04

## 2015-09-17 MED ORDER — TERAZOSIN HCL 1 MG PO CAPS: 1.0000 mg | ORAL_CAPSULE | Freq: Every day | ORAL | Status: DC

## 2015-09-17 MED ORDER — AMLODIPINE BESYLATE 10 MG PO TABS: 10.0000 mg | ORAL_TABLET | Freq: Every day | ORAL | Status: DC

## 2015-09-20 ENCOUNTER — Ambulatory Visit (INDEPENDENT_AMBULATORY_CARE_PROVIDER_SITE_OTHER): Payer: BLUE CROSS/BLUE SHIELD | Admitting: Family Medicine

## 2015-09-20 ENCOUNTER — Encounter: Payer: Self-pay | Admitting: Family Medicine

## 2015-09-20 VITALS — BP 135/60 | HR 59 | Temp 96.8°F | Ht 66.0 in | Wt 178.6 lb

## 2015-09-20 DIAGNOSIS — E109 Type 1 diabetes mellitus without complications: Secondary | ICD-10-CM

## 2015-09-20 DIAGNOSIS — I10 Essential (primary) hypertension: Secondary | ICD-10-CM

## 2015-09-20 DIAGNOSIS — I6521 Occlusion and stenosis of right carotid artery: Secondary | ICD-10-CM

## 2015-09-20 LAB — POCT GLYCOSYLATED HEMOGLOBIN (HGB A1C): HEMOGLOBIN A1C: 7.3

## 2015-09-20 NOTE — Progress Notes (Signed)
   Subjective:    Patient ID: Evan Stanley, male    DOB: Sep 03, 1940, 75 y.o.   MRN: [id number]  HPI 69-year-old gentleman here to follow-up diabetes hyperlipidemia and hypertension. Since his last visit here amlodipine was increased from 5-10 mg. Blood pressure today is better at 135/60 compared to 150/70 at last recording. Diabetes has been well managed. Last A1c 3 months ago was 7.2. He was given Hytrin at cardiology office. He describes nocturia 2 but says this does not really interfere with his quality of life and I suggested he may hold off on that medication until such time as nocturia becomes a problem.  Patient Active Problem List   Diagnosis Date Noted  . Carotid artery occlusion without infarction 10/26/2014  . Carotid stenosis 10/26/2014  . Status post aortic valve replacement with bioprosthetic valve 07/05/2013  . Hyperlipidemia 07/05/2013  . Essential hypertension 07/05/2013  . CAD (coronary artery disease)   . DM (diabetes mellitus) [facility])    Outpatient Encounter Prescriptions as of 09/20/2015  Medication Sig  . amLODipine (NORVASC) 10 MG tablet Take 1 tablet (10 mg total) by mouth daily.  Marland Kitchen aspirin EC 81 MG tablet Take 1 tablet (81 mg total) by mouth daily.  . hydrochlorothiazide (HYDRODIURIL) 25 MG tablet Take 1 tablet (25 mg total) by mouth daily.  Marland Kitchen lisinopril (PRINIVIL,ZESTRIL) 40 MG tablet TAKE 1 TABLET DAILY  . metFORMIN (GLUCOPHAGE) 1000 MG tablet Take 1 tablet (1,000 mg total) by mouth 2 (two) times daily with a meal.  . metoprolol succinate (TOPROL-XL) 100 MG 24 hr tablet Take 1 tablet (100 mg total) by mouth daily. Take with or immediately following a meal.  . pravastatin (PRAVACHOL) 40 MG tablet Take 40 mg by mouth daily.  Marland Kitchen terazosin (HYTRIN) 1 MG capsule Take 1 capsule (1 mg total) by mouth at bedtime.   No facility-administered encounter medications on file as of 09/20/2015.      Review of Systems  Constitutional: Negative.   Respiratory: Negative.     Cardiovascular: Negative.   Genitourinary: Positive for urgency.  Neurological: Negative.   Psychiatric/Behavioral: Negative.        Objective:   Physical Exam  Constitutional: He is oriented to person, place, and time. He appears well-developed and well-nourished.  Cardiovascular: Normal rate, regular rhythm and intact distal pulses.   Pulmonary/Chest: Effort normal and breath sounds normal.  Neurological: He is alert and oriented to person, place, and time.          Assessment & Plan:  1. Essential hypertension Blood pressure has improved with the addition of 5 mg of amlodipine - CMP14+EGFR - Lipid panel  2. Type 1 diabetes mellitus without complication ([place]) 2 L have been good when he checks at home. I think A1c of 7.2 is adequate. We'll check lipids today since he is on pravastatin. Has not quite been 1 year but close enough - POCT glycosylated hemoglobin (Hb A1C)  Wardell Honour MD

## 2015-09-20 NOTE — Patient Instructions (Signed)
Thank you for allowing us to care for you today. We strive to provide exceptional quality and compassionate care. Please let us know how we are doing and how we can help serve you better by filling out the survey that you receive from [employer].     

## 2015-09-21 LAB — CMP14+EGFR
ALBUMIN: 4.3 g/dL (ref 3.5–4.8)
ALK PHOS: 54 IU/L (ref 39–117)
ALT: 24 IU/L (ref 0–44)
AST: 22 IU/L (ref 0–40)
Albumin/Globulin Ratio: 2 (ref 1.1–2.5)
BILIRUBIN TOTAL: 0.4 mg/dL (ref 0.0–1.2)
BUN / CREAT RATIO: 8 — AB (ref 10–22)
BUN: 12 mg/dL (ref 8–27)
CHLORIDE: 98 mmol/L (ref 96–106)
CO2: 24 mmol/L (ref 18–29)
CREATININE: 1.48 mg/dL — AB (ref 0.76–1.27)
Calcium: 9.6 mg/dL (ref 8.6–10.2)
GFR calc Af Amer: 54 mL/min/{1.73_m2} — ABNORMAL LOW (ref >59)
GFR calc non Af Amer: 47 mL/min/{1.73_m2} — ABNORMAL LOW (ref >59)
GLUCOSE: 195 mg/dL — AB (ref 65–99)
Globulin, Total: 2.2 g/dL (ref 1.5–4.5)
Potassium: 5 mmol/L (ref 3.5–5.2)
Sodium: 141 mmol/L (ref 134–144)
Total Protein: 6.5 g/dL (ref 6.0–8.5)

## 2015-09-21 LAB — LIPID PANEL
CHOLESTEROL TOTAL: 176 mg/dL (ref 100–199)
Chol/HDL Ratio: 4.8 ratio units (ref 0.0–5.0)
HDL: 37 mg/dL — AB (ref >39)
LDL Calculated: 110 mg/dL — ABNORMAL HIGH (ref 0–99)
TRIGLYCERIDES: 147 mg/dL (ref 0–149)
VLDL CHOLESTEROL CAL: 29 mg/dL (ref 5–40)

## 2015-10-07 DIAGNOSIS — H43822 Vitreomacular adhesion, left eye: Secondary | ICD-10-CM | POA: Diagnosis not present

## 2015-10-07 DIAGNOSIS — E113512 Type 2 diabetes mellitus with proliferative diabetic retinopathy with macular edema, left eye: Secondary | ICD-10-CM | POA: Diagnosis not present

## 2015-10-07 DIAGNOSIS — H34232 Retinal artery branch occlusion, left eye: Secondary | ICD-10-CM | POA: Diagnosis not present

## 2015-10-07 DIAGNOSIS — H211X2 Other vascular disorders of iris and ciliary body, left eye: Secondary | ICD-10-CM | POA: Diagnosis not present

## 2015-10-14 DIAGNOSIS — E113512 Type 2 diabetes mellitus with proliferative diabetic retinopathy with macular edema, left eye: Secondary | ICD-10-CM | POA: Diagnosis not present

## 2015-10-29 DIAGNOSIS — E113512 Type 2 diabetes mellitus with proliferative diabetic retinopathy with macular edema, left eye: Secondary | ICD-10-CM | POA: Diagnosis not present

## 2015-10-29 DIAGNOSIS — H211X2 Other vascular disorders of iris and ciliary body, left eye: Secondary | ICD-10-CM | POA: Diagnosis not present

## 2015-10-29 HISTORY — PX: OTHER SURGICAL HISTORY: SHX169

## 2015-10-30 ENCOUNTER — Encounter: Payer: Self-pay | Admitting: Family

## 2015-11-08 ENCOUNTER — Other Ambulatory Visit: Payer: Self-pay

## 2015-11-08 ENCOUNTER — Encounter: Payer: Self-pay | Admitting: Family

## 2015-11-08 ENCOUNTER — Ambulatory Visit (HOSPITAL_COMMUNITY)
Admission: RE | Admit: 2015-11-08 | Discharge: 2015-11-08 | Disposition: A | Discharge status: Home / Self Care | Payer: BLUE CROSS/BLUE SHIELD | Source: Ambulatory Visit | Attending: Family | Admitting: Family

## 2015-11-08 ENCOUNTER — Ambulatory Visit (INDEPENDENT_AMBULATORY_CARE_PROVIDER_SITE_OTHER): Payer: BLUE CROSS/BLUE SHIELD | Admitting: Family

## 2015-11-08 VITALS — BP 134/69 | HR 56 | Temp 97.2°F | Resp 18 | Ht 65.5 in | Wt 176.8 lb

## 2015-11-08 DIAGNOSIS — E119 Type 2 diabetes mellitus without complications: Secondary | ICD-10-CM | POA: Diagnosis not present

## 2015-11-08 DIAGNOSIS — E785 Hyperlipidemia, unspecified: Secondary | ICD-10-CM | POA: Diagnosis not present

## 2015-11-08 DIAGNOSIS — I110 Hypertensive heart disease with heart failure: Secondary | ICD-10-CM | POA: Insufficient documentation

## 2015-11-08 DIAGNOSIS — I509 Heart failure, unspecified: Secondary | ICD-10-CM | POA: Insufficient documentation

## 2015-11-08 DIAGNOSIS — I6521 Occlusion and stenosis of right carotid artery: Secondary | ICD-10-CM

## 2015-11-08 DIAGNOSIS — I6523 Occlusion and stenosis of bilateral carotid arteries: Secondary | ICD-10-CM | POA: Insufficient documentation

## 2015-11-08 DIAGNOSIS — I6522 Occlusion and stenosis of left carotid artery: Secondary | ICD-10-CM

## 2015-11-08 NOTE — Patient Instructions (Signed)
Stroke Prevention Some medical conditions and behaviors are associated with an increased chance of having a stroke. You may prevent a stroke by making healthy choices and managing medical conditions. HOW CAN I REDUCE MY RISK OF HAVING A STROKE?   Stay physically active. Get at least 30 minutes of activity on most or all days.  Do not smoke. It may also be helpful to avoid exposure to secondhand smoke.  Limit alcohol use. Moderate alcohol use is considered to be:  No more than 2 drinks per day for men.  No more than 1 drink per day for nonpregnant women.  Eat healthy foods. This involves:  Eating 5 or more servings of fruits and vegetables a day.  Making dietary changes that address high blood pressure (hypertension), high cholesterol, diabetes, or obesity.  Manage your cholesterol levels.  Making food choices that are high in fiber and low in saturated fat, trans fat, and cholesterol may control cholesterol levels.  Take any prescribed medicines to control cholesterol as directed by your health care provider.  Manage your diabetes.  Controlling your carbohydrate and sugar intake is recommended to manage diabetes.  Take any prescribed medicines to control diabetes as directed by your health care provider.  Control your hypertension.  Making food choices that are low in salt (sodium), saturated fat, trans fat, and cholesterol is recommended to manage hypertension.  Ask your health care provider if you need treatment to lower your blood pressure. Take any prescribed medicines to control hypertension as directed by your health care provider.  If you are 18-39 years of age, have your blood pressure checked every 3-5 years. If you are 40 years of age or older, have your blood pressure checked every year.  Maintain a healthy weight.  Reducing calorie intake and making food choices that are low in sodium, saturated fat, trans fat, and cholesterol are recommended to manage  weight.  Stop drug abuse.  Avoid taking birth control pills.  Talk to your health care provider about the risks of taking birth control pills if you are over 35 years old, smoke, get migraines, or have ever had a blood clot.  Get evaluated for sleep disorders (sleep apnea).  Talk to your health care provider about getting a sleep evaluation if you snore a lot or have excessive sleepiness.  Take medicines only as directed by your health care provider.  For some people, aspirin or blood thinners (anticoagulants) are helpful in reducing the risk of forming abnormal blood clots that can lead to stroke. If you have the irregular heart rhythm of atrial fibrillation, you should be on a blood thinner unless there is a good reason you cannot take them.  Understand all your medicine instructions.  Make sure that other conditions (such as anemia or atherosclerosis) are addressed. SEEK IMMEDIATE MEDICAL CARE IF:   You have sudden weakness or numbness of the face, arm, or leg, especially on one side of the body.  Your face or eyelid droops to one side.  You have sudden confusion.  You have trouble speaking (aphasia) or understanding.  You have sudden trouble seeing in one or both eyes.  You have sudden trouble walking.  You have dizziness.  You have a loss of balance or coordination.  You have a sudden, severe headache with no known cause.  You have new chest pain or an irregular heartbeat. Any of these symptoms may represent a serious problem that is an emergency. Do not wait to see if the symptoms will   go away. Get medical help at once. Call your local emergency services (911 in [place] Do not drive yourself to the hospital.   This information is not intended to replace advice given to you by your health care provider. Make sure you discuss any questions you have with your health care provider.   Document Released: 08/13/2004 Document Revised: 07/27/2014 Document Reviewed:  01/06/2013 Elsevier Interactive Patient Education 2016 Elsevier Inc.  

## 2015-11-08 NOTE — Progress Notes (Signed)
Chief Complaint: Extracranial Carotid Artery Occlusion and Stenosis   History of Present Illness  Evan Stanley is a 75 y.o. male patient of Dr. Bridgett Larsson who presents for follow up of carotid artery stenosis. This patient previous has had an episode of left eye visual field loss, about 2014, which was attributed to stroke. He had a <1 hour episode of slurring of speech about April 2016. A CT at that point was not diagnostic. He saw his cardiologist in April 2016 for B carotid studies which demonstrated: RICA >09% stenosis, LICA: occluded. The patient has never had amaurosis fugax but has developed partial visual field loss. The patient has never had facial drooping or hemiplegia. The patient has had expressive aphasia. The patient's previous neurologic deficits have resolved and he denies any subsequent stroke or TIA symptoms. The patient's risks factors for carotid disease include: DM, HLD, HTN, afib. Dr. Bridgett Larsson last saw pt on 11/02/14. At that time pt had asx R ICA stenosis 60%, L ICA occlusion by CTA. The technique of the CTA appeared to be good, so Dr. Bridgett Larsson was inclined to believe the CTA more than the carotid duplex which is dependent on operator expertise.Based on the Dr. Bridgett Larsson offered the patient: maximal medical mgmt and follow in 6 months with B carotid duplex. If velocities in R ICA continue to be elevated, carotid angiography might be necessary to determine exact level of the disease, given the surgical difficulty with placement of shunt with a high lesion.  About 3 weeks ago he had an episode of dizziness after about 10 steps after getting out of his car which resolved after he sat down, no other accompanying neurological events.   Pt had a laser procedure performed on his left eye in April 2017 to prevent his left eye from being removed; pt states he was told by Dr. Zadie Rhine that he had a stroke in the left eye.   Pt Diabetic: yes, 7.3 A1C March 2017 (review of records) Pt smoker:  former smoker, quit in 1988  Pt meds include: Statin : no ASA: yes, 650 mg daily Other anticoagulants/antiplatelets: no   Past Medical History  Diagnosis Date  . Diabetes mellitus without complication ([place])   . Hyperlipidemia   . Hypertension   . CHF (congestive heart failure) ([place])   . Anxiety   . Atrial fibrillation ([place])   . Carotid artery stenosis   . TIA (transient ischemic attack)   . Decreased vision     left eye    Social History Social History  Substance Use Topics  . Smoking status: Former Smoker    Quit date: 07/20/1984  . Smokeless tobacco: Never Used  . Alcohol Use: No    Family History Family History  Problem Relation Age of Onset  . Heart disease Mother   . Stroke Father 41    Surgical History Past Surgical History  Procedure Laterality Date  . Cardiac valve replacement    . Aortic valve replacement    . Back surgery    . Laser surgery, left eye  Left 10-29-2015     retinal surgery/ Deloria Lair MD     No Known Allergies  Current Outpatient Prescriptions  Medication Sig Dispense Refill  . amLODipine (NORVASC) 10 MG tablet Take 1 tablet (10 mg total) by mouth daily. 90 tablet 1  . aspirin EC 81 MG tablet Take 1 tablet (81 mg total) by mouth daily. 90 tablet 3  . hydrochlorothiazide (HYDRODIURIL) 25 MG tablet Take 1 tablet (25  mg total) by mouth daily. 90 tablet 1  . lisinopril (PRINIVIL,ZESTRIL) 40 MG tablet TAKE 1 TABLET DAILY 90 tablet 1  . metFORMIN (GLUCOPHAGE) 1000 MG tablet Take 1 tablet (1,000 mg total) by mouth 2 (two) times daily with a meal. 180 tablet 1  . metoprolol succinate (TOPROL-XL) 100 MG 24 hr tablet Take 1 tablet (100 mg total) by mouth daily. Take with or immediately following a meal. 90 tablet 1  . pravastatin (PRAVACHOL) 40 MG tablet Take 40 mg by mouth daily.    Marland Kitchen terazosin (HYTRIN) 1 MG capsule Take 1 capsule (1 mg total) by mouth at bedtime. (Patient not taking: Reported on 09/20/2015) 90 capsule 1   No current  facility-administered medications for this visit.    Review of Systems : See HPI for pertinent positives and negatives.  Physical Examination  Filed Vitals:   11/08/15 1247  BP: 134/69  Pulse: 56  Temp: 97.2 F (36.2 C)  TempSrc: Oral  Resp: 18  Height: 5' 5.5" (1.664 m)  Weight: 176 lb 12.8 oz (80.196 kg)  SpO2: 99%   Body mass index is 28.96 kg/(m^2).  General: A&O x 3, WDWN  Head: [place]/AT  Eyes: PERRLA  Pulmonary: Sym exp, good air movt, CTAB, no rales, rhonchi, or wheezing  Cardiac: RRR, Nl S1, S2, no detected murmur  Vascular: Vessel Right Left  Radial Palpable Palpable  Brachial Palpable Palpable  Carotid Palpable, without bruit Palpable, without bruit  Aorta Not palpable N/A  Femoral Palpable Palpable  Popliteal Not palpable Not palpable  PT Not Palpable Not Palpable  DP Not Palpable Palpable   Gastrointestinal: soft, NTND, -G/R, - HSM, - palpable masses, - CVAT B  Musculoskeletal: M/S 5/5 throughout , extremities without ischemic changes   Neurologic: CN 2-12 intact, pain and light touch intact in extremities, motor exam as listed above  Psychiatric: Judgment intact, Mood & affect appropriate for pt's clinical situation  Dermatologic: See M/S exam for extremity exam, no rashes otherwise noted               11/02/14 CTA neck: Based on the CTA, the L ICA is completed occluded without string sign. The R ICA has 2 mm lumen putting the stenosis <80%. R ICA stenosis is near angle of jaw on A-P reconstruction.     Non-Invasive Vascular Imaging CAROTID DUPLEX 11/08/2015   Right ICA: 80 - 99 % stenosis. Left ICA: Occluded  No significant change compared to the last exam on 05/10/15    Assessment: Evan Stanley is a 75 y.o. male who had an episode of left eye visual field loss, about 2014, which was attributed to stroke. He had a <1 hour episode of slurring of speech about April 2016. Head CT  at that point was not diagnostic.  He has had no subsequent lateralizing symptoms.  Today's carotid duplex suggests 80 - 99 % right ICA stenosis, occluded left ICA. No significant change compared to the last exam on 05/10/15.  I discussed with Dr. Bridgett Larsson pt HPI and carotid duplex results, see Plan   Plan: Pt will be scheduled for carotid angiogram by Dr. Bridgett Larsson on 5/1 or 11/21/15.  I discussed in depth with the patient the nature of atherosclerosis, and emphasized the importance of maximal medical management including strict control of blood pressure, blood glucose, and lipid levels, obtaining regular exercise, and continued cessation of smoking.  The patient is aware that without maximal medical management the underlying atherosclerotic disease process will progress, limiting the benefit of  any interventions. The patient was given information about stroke prevention and what symptoms should prompt the patient to seek immediate medical care. Thank you for allowing [place] to participate in this patient's care.  Clemon Chambers, RN, MSN, FNP-C [facility] Office: [phone]  Clinic Physician: Bridgett Larsson  11/08/2015 12:57 PM

## 2015-11-18 ENCOUNTER — Encounter (HOSPITAL_COMMUNITY)
Admission: RE | Disposition: A | Discharge status: Rehabilitation Facility | Payer: Self-pay | Source: Ambulatory Visit | Attending: Vascular Surgery

## 2015-11-18 ENCOUNTER — Inpatient Hospital Stay (HOSPITAL_COMMUNITY)
Admission: RE | Admit: 2015-11-18 | Discharge: 2015-11-22 | DRG: 067 | Disposition: A | Discharge status: Rehabilitation Facility | Payer: BLUE CROSS/BLUE SHIELD | Source: Ambulatory Visit | Attending: Vascular Surgery | Admitting: Vascular Surgery

## 2015-11-18 ENCOUNTER — Observation Stay (HOSPITAL_COMMUNITY): Payer: BLUE CROSS/BLUE SHIELD

## 2015-11-18 ENCOUNTER — Other Ambulatory Visit: Payer: Self-pay | Admitting: *Deleted

## 2015-11-18 ENCOUNTER — Telehealth: Payer: Self-pay | Admitting: Vascular Surgery

## 2015-11-18 DIAGNOSIS — I6523 Occlusion and stenosis of bilateral carotid arteries: Principal | ICD-10-CM | POA: Diagnosis present

## 2015-11-18 DIAGNOSIS — F419 Anxiety disorder, unspecified: Secondary | ICD-10-CM | POA: Diagnosis present

## 2015-11-18 DIAGNOSIS — Z7984 Long term (current) use of oral hypoglycemic drugs: Secondary | ICD-10-CM

## 2015-11-18 DIAGNOSIS — E1122 Type 2 diabetes mellitus with diabetic chronic kidney disease: Secondary | ICD-10-CM | POA: Diagnosis present

## 2015-11-18 DIAGNOSIS — N189 Chronic kidney disease, unspecified: Secondary | ICD-10-CM | POA: Diagnosis present

## 2015-11-18 DIAGNOSIS — I779 Disorder of arteries and arterioles, unspecified: Secondary | ICD-10-CM | POA: Diagnosis present

## 2015-11-18 DIAGNOSIS — Z79899 Other long term (current) drug therapy: Secondary | ICD-10-CM

## 2015-11-18 DIAGNOSIS — I130 Hypertensive heart and chronic kidney disease with heart failure and stage 1 through stage 4 chronic kidney disease, or unspecified chronic kidney disease: Secondary | ICD-10-CM | POA: Diagnosis present

## 2015-11-18 DIAGNOSIS — H5340 Unspecified visual field defects: Secondary | ICD-10-CM | POA: Diagnosis not present

## 2015-11-18 DIAGNOSIS — I5032 Chronic diastolic (congestive) heart failure: Secondary | ICD-10-CM | POA: Diagnosis present

## 2015-11-18 DIAGNOSIS — I6521 Occlusion and stenosis of right carotid artery: Secondary | ICD-10-CM

## 2015-11-18 DIAGNOSIS — G8194 Hemiplegia, unspecified affecting left nondominant side: Secondary | ICD-10-CM | POA: Diagnosis not present

## 2015-11-18 DIAGNOSIS — R4701 Aphasia: Secondary | ICD-10-CM | POA: Diagnosis not present

## 2015-11-18 DIAGNOSIS — E1142 Type 2 diabetes mellitus with diabetic polyneuropathy: Secondary | ICD-10-CM | POA: Diagnosis present

## 2015-11-18 DIAGNOSIS — R2990 Unspecified symptoms and signs involving the nervous system: Secondary | ICD-10-CM

## 2015-11-18 DIAGNOSIS — I2510 Atherosclerotic heart disease of native coronary artery without angina pectoris: Secondary | ICD-10-CM | POA: Diagnosis present

## 2015-11-18 DIAGNOSIS — Z8673 Personal history of transient ischemic attack (TIA), and cerebral infarction without residual deficits: Secondary | ICD-10-CM

## 2015-11-18 DIAGNOSIS — R4781 Slurred speech: Secondary | ICD-10-CM | POA: Diagnosis not present

## 2015-11-18 DIAGNOSIS — I10 Essential (primary) hypertension: Secondary | ICD-10-CM | POA: Diagnosis present

## 2015-11-18 DIAGNOSIS — H547 Unspecified visual loss: Secondary | ICD-10-CM | POA: Diagnosis present

## 2015-11-18 DIAGNOSIS — Z7982 Long term (current) use of aspirin: Secondary | ICD-10-CM

## 2015-11-18 DIAGNOSIS — I739 Peripheral vascular disease, unspecified: Secondary | ICD-10-CM

## 2015-11-18 DIAGNOSIS — I4891 Unspecified atrial fibrillation: Secondary | ICD-10-CM | POA: Diagnosis present

## 2015-11-18 DIAGNOSIS — I639 Cerebral infarction, unspecified: Secondary | ICD-10-CM

## 2015-11-18 DIAGNOSIS — K5900 Constipation, unspecified: Secondary | ICD-10-CM | POA: Diagnosis not present

## 2015-11-18 DIAGNOSIS — R300 Dysuria: Secondary | ICD-10-CM | POA: Diagnosis not present

## 2015-11-18 DIAGNOSIS — E785 Hyperlipidemia, unspecified: Secondary | ICD-10-CM | POA: Diagnosis present

## 2015-11-18 DIAGNOSIS — Z87891 Personal history of nicotine dependence: Secondary | ICD-10-CM

## 2015-11-18 DIAGNOSIS — Z952 Presence of prosthetic heart valve: Secondary | ICD-10-CM

## 2015-11-18 DIAGNOSIS — Z01810 Encounter for preprocedural cardiovascular examination: Secondary | ICD-10-CM

## 2015-11-18 DIAGNOSIS — I6529 Occlusion and stenosis of unspecified carotid artery: Secondary | ICD-10-CM | POA: Diagnosis present

## 2015-11-18 HISTORY — DX: Cerebral infarction, unspecified: I63.9

## 2015-11-18 HISTORY — PX: PERIPHERAL VASCULAR CATHETERIZATION: SHX172C

## 2015-11-18 LAB — CBC
HCT: 42.8 % (ref 39.0–52.0)
Hemoglobin: 14.4 g/dL (ref 13.0–17.0)
MCH: 28 pg (ref 26.0–34.0)
MCHC: 33.6 g/dL (ref 30.0–36.0)
MCV: 83.1 fL (ref 78.0–100.0)
PLATELETS: 211 10*3/uL (ref 150–400)
RBC: 5.15 MIL/uL (ref 4.22–5.81)
RDW: 13.3 % (ref 11.5–15.5)
WBC: 9.1 10*3/uL (ref 4.0–10.5)

## 2015-11-18 LAB — COMPREHENSIVE METABOLIC PANEL
ALT: 25 U/L (ref 17–63)
ANION GAP: 11 (ref 5–15)
AST: 19 U/L (ref 15–41)
Albumin: 4.1 g/dL (ref 3.5–5.0)
Alkaline Phosphatase: 53 U/L (ref 38–126)
BUN: 16 mg/dL (ref 6–20)
CHLORIDE: 106 mmol/L (ref 101–111)
CO2: 26 mmol/L (ref 22–32)
Calcium: 9.9 mg/dL (ref 8.9–10.3)
Creatinine, Ser: 1.43 mg/dL — ABNORMAL HIGH (ref 0.61–1.24)
GFR, EST AFRICAN AMERICAN: 55 mL/min — AB (ref >60)
GFR, EST NON AFRICAN AMERICAN: 47 mL/min — AB (ref >60)
Glucose, Bld: 157 mg/dL — ABNORMAL HIGH (ref 65–99)
POTASSIUM: 4.3 mmol/L (ref 3.5–5.1)
Sodium: 143 mmol/L (ref 135–145)
Total Bilirubin: 0.7 mg/dL (ref 0.3–1.2)
Total Protein: 6.9 g/dL (ref 6.5–8.1)

## 2015-11-18 LAB — URINALYSIS, ROUTINE W REFLEX MICROSCOPIC
BILIRUBIN URINE: NEGATIVE
Glucose, UA: NEGATIVE mg/dL
HGB URINE DIPSTICK: NEGATIVE
KETONES UR: NEGATIVE mg/dL
Leukocytes, UA: NEGATIVE
NITRITE: NEGATIVE
PROTEIN: NEGATIVE mg/dL
SPECIFIC GRAVITY, URINE: 1.015 (ref 1.005–1.030)
pH: 7.5 (ref 5.0–8.0)

## 2015-11-18 LAB — POCT I-STAT, CHEM 8
BUN: 24 mg/dL — AB (ref 6–20)
CHLORIDE: 101 mmol/L (ref 101–111)
Calcium, Ion: 1.2 mmol/L (ref 1.13–1.30)
Creatinine, Ser: 1.4 mg/dL — ABNORMAL HIGH (ref 0.61–1.24)
GLUCOSE: 188 mg/dL — AB (ref 65–99)
HCT: 45 % (ref 39.0–52.0)
Hemoglobin: 15.3 g/dL (ref 13.0–17.0)
POTASSIUM: 4.3 mmol/L (ref 3.5–5.1)
Sodium: 140 mmol/L (ref 135–145)
TCO2: 27 mmol/L (ref 0–100)

## 2015-11-18 LAB — PROTIME-INR
INR: 1.02 (ref 0.00–1.49)
PROTHROMBIN TIME: 13.6 s (ref 11.6–15.2)

## 2015-11-18 LAB — GLUCOSE, CAPILLARY
GLUCOSE-CAPILLARY: 156 mg/dL — AB (ref 65–99)
Glucose-Capillary: 173 mg/dL — ABNORMAL HIGH (ref 65–99)

## 2015-11-18 SURGERY — CAROTID ANGIOGRAPHY
Anesthesia: LOCAL | Laterality: Right

## 2015-11-18 MED ORDER — AMLODIPINE BESYLATE 10 MG PO TABS
10.0000 mg | ORAL_TABLET | Freq: Every day | ORAL | Status: DC
  Administered 2015-11-19 – 2015-11-22 (×4): 10 mg via ORAL
  Filled 2015-11-18 (×4): qty 1

## 2015-11-18 MED ORDER — LIDOCAINE HCL (PF) 1 % IJ SOLN
INTRAMUSCULAR | Status: DC | PRN
  Administered 2015-11-18: 10 mL

## 2015-11-18 MED ORDER — PRAVASTATIN SODIUM 40 MG PO TABS
40.0000 mg | ORAL_TABLET | Freq: Every day | ORAL | Status: DC
  Administered 2015-11-18 – 2015-11-22 (×5): 40 mg via ORAL
  Filled 2015-11-18 (×6): qty 1

## 2015-11-18 MED ORDER — STROKE: EARLY STAGES OF RECOVERY BOOK
Freq: Once | Status: AC
  Administered 2015-11-21: 17:00:00
  Filled 2015-11-18 (×2): qty 1

## 2015-11-18 MED ORDER — ASPIRIN EC 81 MG PO TBEC
81.0000 mg | DELAYED_RELEASE_TABLET | Freq: Every day | ORAL | Status: DC
  Administered 2015-11-19 – 2015-11-22 (×4): 81 mg via ORAL
  Filled 2015-11-18 (×4): qty 1

## 2015-11-18 MED ORDER — ALUM & MAG HYDROXIDE-SIMETH 200-200-20 MG/5ML PO SUSP: 15.0000 mL | ORAL | Status: DC | PRN

## 2015-11-18 MED ORDER — LIDOCAINE HCL (PF) 1 % IJ SOLN
INTRAMUSCULAR | Status: AC
  Filled 2015-11-18: qty 30

## 2015-11-18 MED ORDER — SODIUM CHLORIDE 0.9 % IV SOLN: 1.0000 mL/kg/h | INTRAVENOUS | Status: DC

## 2015-11-18 MED ORDER — LABETALOL HCL 5 MG/ML IV SOLN: 10.0000 mg | INTRAVENOUS | Status: DC | PRN

## 2015-11-18 MED ORDER — ONDANSETRON HCL 4 MG/2ML IJ SOLN
4.0000 mg | Freq: Four times a day (QID) | INTRAMUSCULAR | Status: DC | PRN
  Administered 2015-11-21: 4 mg via INTRAVENOUS
  Filled 2015-11-18: qty 2

## 2015-11-18 MED ORDER — METOPROLOL SUCCINATE ER 100 MG PO TB24
100.0000 mg | ORAL_TABLET | Freq: Every day | ORAL | Status: DC
  Administered 2015-11-19 – 2015-11-22 (×4): 100 mg via ORAL
  Filled 2015-11-18 (×4): qty 1

## 2015-11-18 MED ORDER — ACETAMINOPHEN 325 MG PO TABS: 650.0000 mg | ORAL_TABLET | ORAL | Status: DC | PRN

## 2015-11-18 MED ORDER — PHENOL 1.4 % MT LIQD: 1.0000 | OROMUCOSAL | Status: DC | PRN

## 2015-11-18 MED ORDER — SODIUM CHLORIDE 0.9 % IV SOLN
INTRAVENOUS | Status: DC
  Administered 2015-11-18: 12:00:00 via INTRAVENOUS

## 2015-11-18 MED ORDER — IODIXANOL 320 MG/ML IV SOLN
INTRAVENOUS | Status: DC | PRN
  Administered 2015-11-18: 60 mL via INTRAVENOUS

## 2015-11-18 MED ORDER — POTASSIUM CHLORIDE CRYS ER 20 MEQ PO TBCR
20.0000 meq | EXTENDED_RELEASE_TABLET | Freq: Once | ORAL | Status: DC
  Filled 2015-11-18: qty 2

## 2015-11-18 MED ORDER — ENOXAPARIN SODIUM 40 MG/0.4ML ~~LOC~~ SOLN
40.0000 mg | SUBCUTANEOUS | Status: DC
  Administered 2015-11-18 – 2015-11-21 (×4): 40 mg via SUBCUTANEOUS
  Filled 2015-11-18 (×4): qty 0.4

## 2015-11-18 MED ORDER — GUAIFENESIN-DM 100-10 MG/5ML PO SYRP: 15.0000 mL | ORAL_SOLUTION | ORAL | Status: DC | PRN

## 2015-11-18 MED ORDER — METOPROLOL TARTRATE 5 MG/5ML IV SOLN: 2.0000 mg | INTRAVENOUS | Status: DC | PRN

## 2015-11-18 MED ORDER — HYDROCHLOROTHIAZIDE 25 MG PO TABS
25.0000 mg | ORAL_TABLET | Freq: Every day | ORAL | Status: DC
  Administered 2015-11-19 – 2015-11-22 (×4): 25 mg via ORAL
  Filled 2015-11-18 (×4): qty 1

## 2015-11-18 MED ORDER — LISINOPRIL 40 MG PO TABS
40.0000 mg | ORAL_TABLET | Freq: Every day | ORAL | Status: DC
Start: 2015-11-19 — End: 2015-11-22
  Administered 2015-11-19 – 2015-11-22 (×4): 40 mg via ORAL
  Filled 2015-11-18 (×4): qty 1

## 2015-11-18 MED ORDER — METFORMIN HCL 500 MG PO TABS: 1000.0000 mg | ORAL_TABLET | Freq: Two times a day (BID) | ORAL | Status: DC

## 2015-11-18 MED ORDER — PANTOPRAZOLE SODIUM 40 MG PO TBEC
40.0000 mg | DELAYED_RELEASE_TABLET | Freq: Every day | ORAL | Status: DC
  Administered 2015-11-18 – 2015-11-22 (×5): 40 mg via ORAL
  Filled 2015-11-18 (×5): qty 1

## 2015-11-18 MED ORDER — HYDRALAZINE HCL 20 MG/ML IJ SOLN: 10.0000 mg | INTRAMUSCULAR | Status: DC | PRN

## 2015-11-18 MED ORDER — HEPARIN (PORCINE) IN NACL 2-0.9 UNIT/ML-% IJ SOLN
INTRAMUSCULAR | Status: DC | PRN
  Administered 2015-11-18: 1000 mL

## 2015-11-18 MED ORDER — HEPARIN (PORCINE) IN NACL 2-0.9 UNIT/ML-% IJ SOLN
INTRAMUSCULAR | Status: AC
Start: 2015-11-18 — End: 2015-11-18
  Filled 2015-11-18: qty 1000

## 2015-11-18 MED ORDER — OXYCODONE-ACETAMINOPHEN 5-325 MG PO TABS
1.0000 | ORAL_TABLET | ORAL | Status: DC | PRN
  Administered 2015-11-20 – 2015-11-21 (×2): 1 via ORAL
  Administered 2015-11-22: 2 via ORAL
  Administered 2015-11-22: 1 via ORAL
  Filled 2015-11-18: qty 1
  Filled 2015-11-18: qty 2
  Filled 2015-11-18 (×2): qty 1

## 2015-11-18 SURGICAL SUPPLY — 12 items
CATH ANGIO 5F PIGTAIL 100CM (CATHETERS) ×2 IMPLANT
CATH HEADHUNTER H1 5F 100CM (CATHETERS) ×3 IMPLANT
COVER PRB 48X5XTLSCP FOLD TPE (BAG) ×1 IMPLANT
COVER PROBE 5X48 (BAG) ×3
KIT MICROINTRODUCER STIFF 5F (SHEATH) ×2 IMPLANT
KIT PV (KITS) ×3 IMPLANT
SHEATH PINNACLE 5F 10CM (SHEATH) ×2 IMPLANT
STOPCOCK MORSE 400PSI 3WAY (MISCELLANEOUS) ×2 IMPLANT
SYR MEDRAD MARK V 150ML (SYRINGE) ×3 IMPLANT
TRANSDUCER W/STOPCOCK (MISCELLANEOUS) ×3 IMPLANT
TRAY PV CATH (CUSTOM PROCEDURE TRAY) ×3 IMPLANT
WIRE BENTSON .035X145CM (WIRE) ×2 IMPLANT

## 2015-11-18 NOTE — Progress Notes (Signed)
Patient ID: Evan Stanley, male   DOB: May 30, 1941, 75 y.o.   MRN: [id number] Called to  See patient with clumsiness of His left leg and possibly left arm. Had diagnostic cerebral arteriogram earlier today. This showedleft internal carotid artery occlusion and greater than 80%right internal carotid artery stenosis. He appears to have normal grip strength. He had been flat in bed with no change in his neurologic status. On attempting to walk after his bedrest. Was over, he was unable to stand due to Clumsiness in his left leg and he was placed back in bed. He can raise his left leg against gravity.He can raise his foot and press down with his foot.Unable to stand and putweight on his foot.  Discussed with the patient present. Concerned about small embolus to his right brain.Will call a code stroke for evaluation. Explained that the probably undergo CT scan and potentially further evaluation tonight. Explained that he would be admitted for observation tonight.

## 2015-11-18 NOTE — Progress Notes (Signed)
Pt left PSS for CT scan with code stroke nurse and tech.

## 2015-11-18 NOTE — Telephone Encounter (Signed)
-----   Message from Mena Goes, RN sent at 11/18/2015  2:12 PM EDT ----- Regarding: schedule cardiac clearance   ----- Message -----    From: Conrad , MD    Sent: 11/18/2015   1:37 PM      To: [facility] [address]   Evan Stanley [id number] 1941/03/10  PROCEDURE: 1.  Right common femoral artery cannulation under ultrasound guidance 2.  Placement of catheter in aorta 3.  Arch Aortogram 4.  Right carotid and cerebral angiogram  Follow-up: 2-4 weeks after Cardiology clearance  Orders(s) for follow-up:  1.  ASAP cardiology consult for clearance for right carotid endarterectomy

## 2015-11-18 NOTE — Progress Notes (Signed)
Pt reported having rash in bilateral groin area with "bumps" on right thigh, reported to Rennis Harding RN who will have Dr. Bridgett Larsson come look at pt prior to sending pt to cath lab.

## 2015-11-18 NOTE — H&P (View-Only) (Signed)
Chief Complaint: Extracranial Carotid Artery Occlusion and Stenosis   History of Present Illness  Evan Stanley is a 75 y.o. male patient of Dr. Bridgett Larsson who presents for follow up of carotid artery stenosis. This patient previous has had an episode of left eye visual field loss, about 2014, which was attributed to stroke. He had a <1 hour episode of slurring of speech about April 2016. A CT at that point was not diagnostic. He saw his cardiologist in April 2016 for B carotid studies which demonstrated: RICA >35% stenosis, LICA: occluded. The patient has never had amaurosis fugax but has developed partial visual field loss. The patient has never had facial drooping or hemiplegia. The patient has had expressive aphasia. The patient's previous neurologic deficits have resolved and he denies any subsequent stroke or TIA symptoms. The patient's risks factors for carotid disease include: DM, HLD, HTN, afib. Dr. Bridgett Larsson last saw pt on 11/02/14. At that time pt had asx R ICA stenosis 60%, L ICA occlusion by CTA. The technique of the CTA appeared to be good, so Dr. Bridgett Larsson was inclined to believe the CTA more than the carotid duplex which is dependent on operator expertise.Based on the Dr. Bridgett Larsson offered the patient: maximal medical mgmt and follow in 6 months with B carotid duplex. If velocities in R ICA continue to be elevated, carotid angiography might be necessary to determine exact level of the disease, given the surgical difficulty with placement of shunt with a high lesion.  About 3 weeks ago he had an episode of dizziness after about 10 steps after getting out of his car which resolved after he sat down, no other accompanying neurological events.   Pt had a laser procedure performed on his left eye in April 2017 to prevent his left eye from being removed; pt states he was told by Dr. Zadie Rhine that he had a stroke in the left eye.   Pt Diabetic: yes, 7.3 A1C March 2017 (review of records) Pt smoker:  former smoker, quit in 1988  Pt meds include: Statin : no ASA: yes, 650 mg daily Other anticoagulants/antiplatelets: no   Past Medical History  Diagnosis Date  . Diabetes mellitus without complication ([place])   . Hyperlipidemia   . Hypertension   . CHF (congestive heart failure) ([place])   . Anxiety   . Atrial fibrillation ([place])   . Carotid artery stenosis   . TIA (transient ischemic attack)   . Decreased vision     left eye    Social History Social History  Substance Use Topics  . Smoking status: Former Smoker    Quit date: 07/20/1984  . Smokeless tobacco: Never Used  . Alcohol Use: No    Family History Family History  Problem Relation Age of Onset  . Heart disease Mother   . Stroke Father 31    Surgical History Past Surgical History  Procedure Laterality Date  . Cardiac valve replacement    . Aortic valve replacement    . Back surgery    . Laser surgery, left eye  Left 10-29-2015     retinal surgery/ Deloria Lair MD     No Known Allergies  Current Outpatient Prescriptions  Medication Sig Dispense Refill  . amLODipine (NORVASC) 10 MG tablet Take 1 tablet (10 mg total) by mouth daily. 90 tablet 1  . aspirin EC 81 MG tablet Take 1 tablet (81 mg total) by mouth daily. 90 tablet 3  . hydrochlorothiazide (HYDRODIURIL) 25 MG tablet Take 1 tablet (25  mg total) by mouth daily. 90 tablet 1  . lisinopril (PRINIVIL,ZESTRIL) 40 MG tablet TAKE 1 TABLET DAILY 90 tablet 1  . metFORMIN (GLUCOPHAGE) 1000 MG tablet Take 1 tablet (1,000 mg total) by mouth 2 (two) times daily with a meal. 180 tablet 1  . metoprolol succinate (TOPROL-XL) 100 MG 24 hr tablet Take 1 tablet (100 mg total) by mouth daily. Take with or immediately following a meal. 90 tablet 1  . pravastatin (PRAVACHOL) 40 MG tablet Take 40 mg by mouth daily.    Marland Kitchen terazosin (HYTRIN) 1 MG capsule Take 1 capsule (1 mg total) by mouth at bedtime. (Patient not taking: Reported on 09/20/2015) 90 capsule 1   No current  facility-administered medications for this visit.    Review of Systems : See HPI for pertinent positives and negatives.  Physical Examination  Filed Vitals:   11/08/15 1247  BP: 134/69  Pulse: 56  Temp: 97.2 F (36.2 C)  TempSrc: Oral  Resp: 18  Height: 5' 5.5" (1.664 m)  Weight: 176 lb 12.8 oz (80.196 kg)  SpO2: 99%   Body mass index is 28.96 kg/(m^2).  General: A&O x 3, WDWN  Head: /AT  Eyes: PERRLA  Pulmonary: Sym exp, good air movt, CTAB, no rales, rhonchi, or wheezing  Cardiac: RRR, Nl S1, S2, no detected murmur  Vascular: Vessel Right Left  Radial Palpable Palpable  Brachial Palpable Palpable  Carotid Palpable, without bruit Palpable, without bruit  Aorta Not palpable N/A  Femoral Palpable Palpable  Popliteal Not palpable Not palpable  PT Not Palpable Not Palpable  DP Not Palpable Palpable   Gastrointestinal: soft, NTND, -G/R, - HSM, - palpable masses, - CVAT B  Musculoskeletal: M/S 5/5 throughout , extremities without ischemic changes   Neurologic: CN 2-12 intact, pain and light touch intact in extremities, motor exam as listed above  Psychiatric: Judgment intact, Mood & affect appropriate for pt's clinical situation  Dermatologic: See M/S exam for extremity exam, no rashes otherwise noted               11/02/14 CTA neck: Based on the CTA, the L ICA is completed occluded without string sign. The R ICA has 2 mm lumen putting the stenosis <80%. R ICA stenosis is near angle of jaw on A-P reconstruction.     Non-Invasive Vascular Imaging CAROTID DUPLEX 11/08/2015   Right ICA: 80 - 99 % stenosis. Left ICA: Occluded  No significant change compared to the last exam on 05/10/15    Assessment: Evan Stanley is a 75 y.o. male who had an episode of left eye visual field loss, about 2014, which was attributed to stroke. He had a <1 hour episode of slurring of speech about April 2016. Head CT  at that point was not diagnostic.  He has had no subsequent lateralizing symptoms.  Today's carotid duplex suggests 80 - 99 % right ICA stenosis, occluded left ICA. No significant change compared to the last exam on 05/10/15.  I discussed with Dr. Bridgett Larsson pt HPI and carotid duplex results, see Plan   Plan: Pt will be scheduled for carotid angiogram by Dr. Bridgett Larsson on 5/1 or 11/21/15.  I discussed in depth with the patient the nature of atherosclerosis, and emphasized the importance of maximal medical management including strict control of blood pressure, blood glucose, and lipid levels, obtaining regular exercise, and continued cessation of smoking.  The patient is aware that without maximal medical management the underlying atherosclerotic disease process will progress, limiting the benefit of  any interventions. The patient was given information about stroke prevention and what symptoms should prompt the patient to seek immediate medical care. Thank you for allowing [place] to participate in this patient's care.  Clemon Chambers, RN, MSN, FNP-C [facility] Office: [phone]  Clinic Physician: Bridgett Larsson  11/08/2015 12:57 PM

## 2015-11-18 NOTE — Discharge Instructions (Signed)
Restart metformin Thursday 11/21/15 Angiogram, Care After Refer to this sheet in the next few weeks. These instructions provide you with information about caring for yourself after your procedure. Your health care provider may also give you more specific instructions. Your treatment has been planned according to current medical practices, but problems sometimes occur. Call your health care provider if you have any problems or questions after your procedure. WHAT TO EXPECT AFTER THE PROCEDURE After your procedure, it is typical to have the following:  Bruising at the catheter insertion site that usually fades within 1-2 weeks.  Blood collecting in the tissue (hematoma) that may be painful to the touch. It should usually decrease in size and tenderness within 1-2 weeks. HOME CARE INSTRUCTIONS  Take medicines only as directed by your health care provider.  You may shower 24-48 hours after the procedure or as directed by your health care provider. Remove the bandage (dressing) and gently wash the site with plain soap and water. Pat the area dry with a clean towel. Do not rub the site, because this may cause bleeding.  Do not take baths, swim, or use a hot tub until your health care provider approves.  Check your insertion site every day for redness, swelling, or drainage.  Do not apply powder or lotion to the site.  Do not lift over 10 lb (4.5 kg) for 5 days after your procedure or as directed by your health care provider.  Ask your health care provider when it is okay to:  Return to work or school.  Resume usual physical activities or sports.  Resume sexual activity.  Do not drive home if you are discharged the same day as the procedure. Have someone else drive you.  You may drive 24 hours after the procedure unless otherwise instructed by your health care provider.  Do not operate machinery or power tools for 24 hours after the procedure or as directed by your health care  provider.  If your procedure was done as an outpatient procedure, which means that you went home the same day as your procedure, a responsible adult should be with you for the first 24 hours after you arrive home.  Keep all follow-up visits as directed by your health care provider. This is important. SEEK MEDICAL CARE IF:  You have a fever.  You have chills.  You have increased bleeding from the catheter insertion site. Hold pressure on the site. CALL 911 SEEK IMMEDIATE MEDICAL CARE IF:  You have unusual pain at the catheter insertion site.  You have redness, warmth, or swelling at the catheter insertion site.  You have drainage (other than a small amount of blood on the dressing) from the catheter insertion site.  The catheter insertion site is bleeding, and the bleeding does not stop after 30 minutes of holding steady pressure on the site.  The area near or just beyond the catheter insertion site becomes pale, cool, tingly, or numb.   This information is not intended to replace advice given to you by your health care provider. Make sure you discuss any questions you have with your health care provider.   Document Released: 01/22/2005 Document Revised: 07/27/2014 Document Reviewed: 12/07/2012 Elsevier Interactive Patient Education [employer].

## 2015-11-18 NOTE — Op Note (Signed)
OPERATIVE NOTE   PROCEDURE: 1.  Right common femoral artery cannulation under ultrasound guidance 2.  Placement of catheter in aorta 3.  Arch Aortogram 4.  Right carotid and cerebral angiogram  PRE-OPERATIVE DIAGNOSIS: Right internal carotid artery stenosis >80%  POST-OPERATIVE DIAGNOSIS: same as above   SURGEON: [name], MD  ANESTHESIA: conscious sedation  ESTIMATED BLOOD LOSS: 50 cc  CONTRAST: 60 cc  FINDING(S):  Patent type aortic arch  Patent innominate artery, left common carotid artery and left subclavian artery  Patent right subclavian artery and right common carotid artery  Patent right internal carotid artery with stenosis >80%  Occluded right external carotid artery   Occluded left internal carotid artery   Intracranial findings per neuroradiology  SPECIMEN(S):  none  INDICATIONS:   Evan Stanley is a 75 y.o. male who presents with studies suggesting a high right internal carotid artery stenosis >80%.  He presents for: arch aortogram and right carotid and cerebral angiogram.  I discussed with the patient the nature of angiographic procedures, especially the limited patencies of any endovascular intervention.  The patient is aware of that the risks of an angiographic procedure include but are not limited to: bleeding, infection, access site complications, renal failure, embolization, rupture of vessel, dissection, possible need for emergent surgical intervention, possible need for surgical procedures to treat the patient's pathology, and stroke and death.  The patient is aware of the risks and agrees to proceed.  DESCRIPTION: After full informed consent was obtained from the patient, the patient was brought back to the angiography suite.  The patient was placed supine upon the angiography table and connected to cardiopulmonary monitoring equipment.  The patient was then given conscious sedation, the amounts of which are documented in the patient's chart.  A  circulating radiologic technician maintained continuous monitoring of the patient's cardiopulmonary status.  Additionally, the control room radiologic technician provided backup monitoring throughout the procedure.  The patient was prepped and drape in the standard fashion for an angiographic procedure.  At this point, attention was turned to the right groin.  Under ultrasound guidance, the subcutaneous tissue surrounding the right common femoral artery was anesthesized with 1% lidocaine with epinephrine.  The artery was then cannulated with a micropuncture needle.  The microwire was advanced into the iliac arterial system.  The needle was exchanged for a microsheath, which was loaded into the common femoral artery over the wire.  The microwire was exchanged for a Bentson wire which was advanced into the aorta.  The microsheath was then exchanged for a 5-Fr sheath which was loaded into the common femoral artery.  The long pigtail catheter was then loaded over the wire up to the level of ascending aorta.  The catheter was connected to the power injector circuit.  After de-airring and de-clotting the circuit, a power injector arch aortogram was completed at 40 degree LAO.  I then pulled the catheter and wire down into the descending thoracic aorta.  The catheter was exchanged over the wire for a H1 catheter.  I then selected the innominate artery with this catheter and wire.  I then selected the right common carotid artery.  I advanced the catheter into the proximal right common carotid artery.  The catheter connected to the power injector after a de-clotting and de-airring maneuver.  The right carotid and cerebral angiogram was completed in anteroposterior and lateral projections.  The findings are listed above.    Based on the images, the patient will be a candidate for  right carotid endarterectomy.  I replaced the wire and then pulled the catheter and wire out. The sheath was aspirated.  No clots were present  and the sheath was reloaded with heparinized saline.     COMPLICATIONS: none  CONDITION: stable  [name], MD [facility] Office: [phone] Pager: [phone]  11/18/2015, 1:30 PM

## 2015-11-18 NOTE — Consult Note (Signed)
Chief Complaint: code stroke  History obtained from:  Patient     HPI:                                                                                                                                         Rishard Delange is an 75 y.o. male who came for elective cath for carotid disease and stroke history. Was found to have R ICA disease that will need intervention that they were planning on doing at a date sometime soon. As he was being ready to be discharged home, he tried to stand but his L leg was heavy along with his L arm. Code stroke was called. NIHSS 2, tpa not given for low NIHSS.   Date last known well: 11/18/15 Time last known well: 18:05 tPA Given: No: not given for low NIHSS No Symptoms         0 No significant disability/able to carry out all usual activities   1 Unable to carry out all previous activities but looks after own affairs             2 Requires help but walks without assistance     3 Unable to walk without assistance/unable to handle own bodily needs 4 Bedridden/incontinent        5 Dead          6  Modified Rankin: Rankin Score=0    Past Medical History  Diagnosis Date  . Diabetes mellitus without complication ([place])   . Hyperlipidemia   . Hypertension   . CHF (congestive heart failure) ([place])   . Anxiety   . Atrial fibrillation ([place])   . Carotid artery stenosis   . TIA (transient ischemic attack)   . Decreased vision     left eye    Past Surgical History  Procedure Laterality Date  . Cardiac valve replacement    . Aortic valve replacement    . Back surgery    . Laser surgery, left eye  Left 10-29-2015     retinal surgery/ Deloria Lair MD     Family History  Problem Relation Age of Onset  . Heart disease Mother   . Stroke Father 8   Social History:  reports that he quit smoking about 31 years ago. He has never used smokeless tobacco. He reports that he does not drink alcohol or use illicit drugs.  Allergies: No Known  Allergies  Medications:  I have reviewed the patient's current medications.  ROS:                                                                                                                                       History obtained from the patient  General ROS: negative for - chills, fatigue, fever, night sweats, weight gain or weight loss Psychological ROS: negative for - behavioral disorder, hallucinations, memory difficulties, mood swings or suicidal ideation Ophthalmic ROS: negative for - blurry vision, double vision, eye pain or loss of vision ENT ROS: negative for - epistaxis, nasal discharge, oral lesions, sore throat, tinnitus or vertigo Allergy and Immunology ROS: negative for - hives or itchy/watery eyes Hematological and Lymphatic ROS: negative for - bleeding problems, bruising or swollen lymph nodes Endocrine ROS: negative for - galactorrhea, hair pattern changes, polydipsia/polyuria or temperature intolerance Respiratory ROS: negative for - cough, hemoptysis, shortness of breath or wheezing Cardiovascular ROS: negative for - chest pain, dyspnea on exertion, edema or irregular heartbeat Gastrointestinal ROS: negative for - abdominal pain, diarrhea, hematemesis, nausea/vomiting or stool incontinence Genito-Urinary ROS: negative for - dysuria, hematuria, incontinence or urinary frequency/urgency Musculoskeletal ROS: negative for - joint swelling or muscular weakness Neurological ROS: as noted in HPI Dermatological ROS: negative for rash and skin lesion changes  Neurologic Examination:                                                                                                      Blood pressure 123/69, pulse 66, temperature 97.3 F (36.3 C), temperature source Oral, resp. rate 27, height 5' 5"  (1.651 m), weight 79.833 kg (176 lb), SpO2 100 %.  HEENT-   Normocephalic, no lesions, without obvious abnormality.  Normal external eye and conjunctiva.  Normal TM's bilaterally.  Normal auditory canals and external ears. Normal external nose, mucus membranes and septum.  Normal pharynx. Cardiovascular- regular rate and rhythm, S1, S2 normal, no murmur, click, rub or gallop, pulses palpable throughout   Lungs- chest clear, no wheezing, rales, normal symmetric air entry, Heart exam - S1, S2 normal, no murmur, no gallop, rate regular Abdomen- soft, non-tender; bowel sounds normal; no masses,  no organomegaly   Neurological Examination Mental Status: Alert, oriented, thought content appropriate.  Speech fluent without evidence of aphasia.  Able to follow 3 step commands without difficulty. Cranial Nerves: II: L pupil fixed at baseline, R brisk and reactive III,IV, VI: ptosis not present, extra-ocular motions intact bilaterally V,VII: mild L facial droop, facial light touch  sensation normal bilaterally VIII: hearing normal bilaterally IX,X: uvula rises symmetrically XI: bilateral shoulder shrug XII: midline tongue extension Motor: Right : Upper extremity   5/5    Left:     Upper extremity   5/5, mild pronator drift  Lower extremity   5/5     Lower extremity   5/5 Tone and bulk:normal tone throughout; no atrophy noted Sensory: Pinprick and light touch intact throughout, bilaterally Cerebellar: normal finger-to-nose, normal rapid alternating movements and normal heel-to-shin test    Lab Results: Basic Metabolic Panel:  Recent Labs Lab 11/18/15 1141  NA 140  K 4.3  CL 101  GLUCOSE 188*  BUN 24*  CREATININE 1.40*    Liver Function Tests: No results for input(s): AST, ALT, ALKPHOS, BILITOT, PROT, ALBUMIN in the last 168 hours. No results for input(s): LIPASE, AMYLASE in the last 168 hours. No results for input(s): AMMONIA in the last 168 hours.  CBC:  Recent Labs Lab 11/18/15 1141  HGB 15.3  HCT 45.0    Cardiac Enzymes: No  results for input(s): CKTOTAL, CKMB, CKMBINDEX, TROPONINI in the last 168 hours.  Lipid Panel: No results for input(s): CHOL, TRIG, HDL, CHOLHDL, VLDL, LDLCALC in the last 168 hours.  CBG:  Recent Labs Lab 11/18/15 1340 11/18/15 1853  GLUCAP 156* 173*    Microbiology: No results found for this or any previous visit.  Coagulation Studies: No results for input(s): LABPROT, INR in the last 72 hours.  Imaging: No results found.  11/18/2015, 7:14 PM   Assessment: 75 y.o. male who came for elective cath for carotid disease and stroke history. Was found to have R ICA disease that will need intervention that they were planning on doing at a date sometime soon. As he was being ready to be discharged home, he tried to stand but his L leg was heavy along with his L arm. Code stroke was called. NIHSS 2, tpa not given for low NIHSS.  CTH pending   - 1. HgbA1c, fasting lipid panel 2. MRI, MRA  of the brain without contrast 3. PT consult, OT consult, Speech consult 4. Echocardiogram 5. Carotid dopplers 6. Prophylactic therapy-Antiplatelet med: Aspirin - dose 328m 7. Risk factor modification 8. Telemetry monitoring 9. Frequent neuro checks 10 NPO until passes stroke swallow screen

## 2015-11-18 NOTE — Progress Notes (Signed)
Pt returned to [facility]

## 2015-11-18 NOTE — Code Documentation (Signed)
Called to see patient at Short Stay as code stroke at 28.  Patient here for outpatient diagnostic cerebral arteriogram today.  Procedure started at 1248 - LSW.  Post procedure he was fine while lying in bed - stood up to get ready for discharge and had trouble standing on left leg - described it as "heavy."  Dr Donnetta Hutching to bedside and called code stroke at 78.  Dr. Cristobal Goldmann at bedside on my arrival - on exam [facility] scored for mild left facial droop and left mild pronator drift.  BP 123/68 CBG 175 - NS bolus started - taken to CT scan.  Too mild to treat per Dr. Cristobal Goldmann.  Handoff to [employer].

## 2015-11-18 NOTE — Interval H&P Note (Signed)
[facility]  History and Physical Update  The patient was interviewed and re-examined.  The patient's previous History and Physical has been reviewed and is unchange my NP's consult.  There is no change in the plan of care: right carotid and cerebral angiogram.  Due to high bifurcation, pt may need a carotid stent if angiogram confirms the CTA findings.[name], MD [facility] [place] Office: [phone] Pager: [phone]  11/18/2015, 11:30 AM

## 2015-11-18 NOTE — Progress Notes (Signed)
Code stroke was called, 10 minutes later 2 team members arrived, repage,

## 2015-11-18 NOTE — Progress Notes (Addendum)
When I went to get him up he was off balance but was not dizzy. He could not keep his balance when standing. Laid him down, normal grips bilateral and normal push and pull with feet, states name and dob with normal speech. Smile equal,  I stood him up again and he has to think about moving his left leg but can raise it without difficulty. Pt was laid back down and dr Donnetta Hutching on call for [facility] was paged with prompt return call. He will come and see the pt. Pt can lift legs and arm without drift. Left eye pupil is fixed and this is old hx, right eye pupil brisk.

## 2015-11-18 NOTE — Telephone Encounter (Signed)
sched cardiology appt with Dr. Servando Snare at the [facility] office on 5/10 at 8. sched [place] appt for 6/2 at 11:30. Lm on hm# to inform pt of appts.

## 2015-11-19 ENCOUNTER — Observation Stay (HOSPITAL_COMMUNITY): Payer: BLUE CROSS/BLUE SHIELD

## 2015-11-19 ENCOUNTER — Encounter (HOSPITAL_COMMUNITY): Payer: Self-pay | Admitting: Vascular Surgery

## 2015-11-19 DIAGNOSIS — Z7984 Long term (current) use of oral hypoglycemic drugs: Secondary | ICD-10-CM | POA: Diagnosis not present

## 2015-11-19 DIAGNOSIS — G8194 Hemiplegia, unspecified affecting left nondominant side: Secondary | ICD-10-CM

## 2015-11-19 DIAGNOSIS — I6523 Occlusion and stenosis of bilateral carotid arteries: Secondary | ICD-10-CM | POA: Diagnosis not present

## 2015-11-19 DIAGNOSIS — R4781 Slurred speech: Secondary | ICD-10-CM | POA: Diagnosis not present

## 2015-11-19 DIAGNOSIS — I779 Disorder of arteries and arterioles, unspecified: Secondary | ICD-10-CM | POA: Diagnosis not present

## 2015-11-19 DIAGNOSIS — E785 Hyperlipidemia, unspecified: Secondary | ICD-10-CM | POA: Diagnosis not present

## 2015-11-19 DIAGNOSIS — N179 Acute kidney failure, unspecified: Secondary | ICD-10-CM | POA: Diagnosis not present

## 2015-11-19 DIAGNOSIS — I6521 Occlusion and stenosis of right carotid artery: Secondary | ICD-10-CM

## 2015-11-19 DIAGNOSIS — R300 Dysuria: Secondary | ICD-10-CM | POA: Diagnosis not present

## 2015-11-19 DIAGNOSIS — I6359 Cerebral infarction due to unspecified occlusion or stenosis of other cerebral artery: Secondary | ICD-10-CM | POA: Diagnosis not present

## 2015-11-19 DIAGNOSIS — Z7982 Long term (current) use of aspirin: Secondary | ICD-10-CM | POA: Diagnosis not present

## 2015-11-19 DIAGNOSIS — Z79899 Other long term (current) drug therapy: Secondary | ICD-10-CM | POA: Diagnosis not present

## 2015-11-19 DIAGNOSIS — E1142 Type 2 diabetes mellitus with diabetic polyneuropathy: Secondary | ICD-10-CM | POA: Diagnosis not present

## 2015-11-19 DIAGNOSIS — I4891 Unspecified atrial fibrillation: Secondary | ICD-10-CM | POA: Diagnosis not present

## 2015-11-19 DIAGNOSIS — Z8673 Personal history of transient ischemic attack (TIA), and cerebral infarction without residual deficits: Secondary | ICD-10-CM | POA: Diagnosis not present

## 2015-11-19 DIAGNOSIS — I5032 Chronic diastolic (congestive) heart failure: Secondary | ICD-10-CM | POA: Diagnosis not present

## 2015-11-19 DIAGNOSIS — I10 Essential (primary) hypertension: Secondary | ICD-10-CM | POA: Diagnosis not present

## 2015-11-19 DIAGNOSIS — H5340 Unspecified visual field defects: Secondary | ICD-10-CM | POA: Diagnosis not present

## 2015-11-19 DIAGNOSIS — I130 Hypertensive heart and chronic kidney disease with heart failure and stage 1 through stage 4 chronic kidney disease, or unspecified chronic kidney disease: Secondary | ICD-10-CM | POA: Diagnosis not present

## 2015-11-19 DIAGNOSIS — I639 Cerebral infarction, unspecified: Secondary | ICD-10-CM | POA: Diagnosis not present

## 2015-11-19 DIAGNOSIS — Z87891 Personal history of nicotine dependence: Secondary | ICD-10-CM | POA: Diagnosis not present

## 2015-11-19 DIAGNOSIS — H547 Unspecified visual loss: Secondary | ICD-10-CM | POA: Diagnosis present

## 2015-11-19 DIAGNOSIS — I6339 Cerebral infarction due to thrombosis of other cerebral artery: Secondary | ICD-10-CM | POA: Diagnosis not present

## 2015-11-19 DIAGNOSIS — I739 Peripheral vascular disease, unspecified: Secondary | ICD-10-CM | POA: Diagnosis present

## 2015-11-19 DIAGNOSIS — N183 Chronic kidney disease, stage 3 (moderate): Secondary | ICD-10-CM | POA: Diagnosis not present

## 2015-11-19 DIAGNOSIS — K5900 Constipation, unspecified: Secondary | ICD-10-CM | POA: Diagnosis not present

## 2015-11-19 DIAGNOSIS — F419 Anxiety disorder, unspecified: Secondary | ICD-10-CM | POA: Diagnosis present

## 2015-11-19 DIAGNOSIS — E1122 Type 2 diabetes mellitus with diabetic chronic kidney disease: Secondary | ICD-10-CM | POA: Diagnosis not present

## 2015-11-19 DIAGNOSIS — N189 Chronic kidney disease, unspecified: Secondary | ICD-10-CM | POA: Diagnosis not present

## 2015-11-19 DIAGNOSIS — I69354 Hemiplegia and hemiparesis following cerebral infarction affecting left non-dominant side: Secondary | ICD-10-CM | POA: Diagnosis present

## 2015-11-19 DIAGNOSIS — L739 Follicular disorder, unspecified: Secondary | ICD-10-CM | POA: Diagnosis not present

## 2015-11-19 DIAGNOSIS — R4701 Aphasia: Secondary | ICD-10-CM | POA: Diagnosis not present

## 2015-11-19 DIAGNOSIS — K5901 Slow transit constipation: Secondary | ICD-10-CM | POA: Diagnosis not present

## 2015-11-19 DIAGNOSIS — E871 Hypo-osmolality and hyponatremia: Secondary | ICD-10-CM | POA: Diagnosis not present

## 2015-11-19 DIAGNOSIS — D72829 Elevated white blood cell count, unspecified: Secondary | ICD-10-CM | POA: Diagnosis not present

## 2015-11-19 DIAGNOSIS — I69319 Unspecified symptoms and signs involving cognitive functions following cerebral infarction: Secondary | ICD-10-CM | POA: Diagnosis not present

## 2015-11-19 DIAGNOSIS — I6789 Other cerebrovascular disease: Secondary | ICD-10-CM | POA: Diagnosis not present

## 2015-11-19 DIAGNOSIS — G4700 Insomnia, unspecified: Secondary | ICD-10-CM | POA: Diagnosis not present

## 2015-11-19 DIAGNOSIS — Z952 Presence of prosthetic heart valve: Secondary | ICD-10-CM | POA: Diagnosis not present

## 2015-11-19 DIAGNOSIS — I2510 Atherosclerotic heart disease of native coronary artery without angina pectoris: Secondary | ICD-10-CM | POA: Diagnosis present

## 2015-11-19 LAB — GLUCOSE, CAPILLARY
GLUCOSE-CAPILLARY: 186 mg/dL — AB (ref 65–99)
GLUCOSE-CAPILLARY: 125 mg/dL — AB (ref 65–99)
Glucose-Capillary: 195 mg/dL — ABNORMAL HIGH (ref 65–99)

## 2015-11-19 MED ORDER — INSULIN ASPART 100 UNIT/ML ~~LOC~~ SOLN
0.0000 [IU] | Freq: Three times a day (TID) | SUBCUTANEOUS | Status: DC
  Administered 2015-11-19 – 2015-11-22 (×9): 2 [IU] via SUBCUTANEOUS
  Administered 2015-11-22 (×2): 1 [IU] via SUBCUTANEOUS

## 2015-11-19 NOTE — Progress Notes (Signed)
   11/19/15 1700  What Happened  Was fall witnessed? Yes  Who witnessed fall? wife  Patients activity before fall to/from bed, chair, or stretcher (pt assist to [facility]. attempt to return to bed unassisted.)  Point of contact buttocks  Was patient injured? No  Follow Up  MD notified Bridgett Larsson  Time MD notified [phone]  Family notified Yes-comment (wife in room)  Time family notified 1712  Simple treatment (none required)  Progress note created (see row info) Yes  Adult Fall Risk Assessment  Risk Factor Category (scoring not indicated) High fall risk per protocol (document High fall risk)  Patient's Fall Risk High Fall Risk (>13 points)  Adult Fall Risk Interventions  Required Bundle Interventions *See Row Information* High fall risk - low, moderate, and high requirements implemented  Additional Interventions Fall risk signage;Bed alarm not indicated with the bundle;Use of appropriate toileting equipment (bedpan, BSC, etc.);Room near nurses station  Fall with Injury Screening  Risk For Fall Injury- See Row Information  Nurse judgement  Vitals  BP 136/71 mmHg  BP Location Left Arm  BP Method Automatic  Patient Position (if appropriate) Sitting  Pulse Rate 63  ECG Heart Rate 63  Cardiac Rhythm NSR  Resp (!) 22  Oxygen Therapy  SpO2 100 %  O2 Device Room Air  Pain Assessment  Pain Assessment No/denies pain  Neurological  Neuro (WDL) X  Level of Consciousness Alert  Orientation Level Oriented X4  Cognition Appropriate at baseline;Impulsive;Poor safety awareness;Poor judgement  Speech Clear  Facial Symmetry Symmetrical  R Hand Grip Strong  L Hand Grip Strong  R Foot Dorsiflexion Strong  L Foot Dorsiflexion Moderate  R Foot Plantar Flexion Strong  L Foot Plantar Flexion Moderate  RUE Motor Response Purposeful movement  LUE Motor Response Purposeful movement  RLE Motor Response Purposeful movement  LLE Motor Response Purposeful movement (weaker than L)  Neuro Additional  Assessments No  Musculoskeletal  Musculoskeletal (WDL) X  Assistive Device BSC;Front wheel Sherri Levenhagen  Generalized Weakness Yes  Weight Bearing Restrictions No  Integumentary  Integumentary (WDL) WDL  RN Assisting with Skin Assessment on Admission [place], RN

## 2015-11-19 NOTE — Progress Notes (Signed)
   Daily Progress Note  Assessment/Planning: POD #1 s/p R carotid angiogram complicated by TIA/CVA, LICA occlusion, high grade R ICA stenosis   Pt previously had a TIA involving slurring of speech previously and visual field loss in L eye  MRI head pending  PT/OT evaluation pending  Subjective  - 1 Day Post-Op  Hasn't walked since last night  Objective Filed Vitals:   11/18/15 1852 11/18/15 2000 11/19/15 0100 11/19/15 0500  BP: 123/69 126/60 131/63 138/64  Pulse: 66 66 58 59  Temp:  97.8 F (36.6 C) 97.7 F (36.5 C) 97.8 F (36.6 C)  TempSrc:  Oral Oral Oral  Resp:   16 18  Height:      Weight:      SpO2: 100% 100% 96% 97%    Intake/Output Summary (Last 24 hours) at 11/19/15 0948 Last data filed at 11/18/15 2200  Gross per 24 hour  Intake      0 ml  Output    200 ml  Net   -200 ml    PULM  CTAB CV  RRR GI  soft, NTND R groin no hematoma NEURO CN intact, MS 5/5, L leg marginally weaker than R   Laboratory CBC    Component Value Date/Time   WBC 9.1 11/18/2015 2041   WBC 8.7 04/16/2014 1002   HGB 14.4 11/18/2015 2041   HGB 15.0 04/16/2014 1002   HCT 42.8 11/18/2015 2041   HCT 45.2 04/16/2014 1002   PLT 211 11/18/2015 2041    BMET    Component Value Date/Time   NA 143 11/18/2015 2041   NA 141 09/20/2015 0807   K 4.3 11/18/2015 2041   CL 106 11/18/2015 2041   CO2 26 11/18/2015 2041   GLUCOSE 157* 11/18/2015 2041   GLUCOSE 195* 09/20/2015 0807   BUN 16 11/18/2015 2041   BUN 12 09/20/2015 0807   CREATININE 1.43* 11/18/2015 2041   CALCIUM 9.9 11/18/2015 2041   GFRNONAA 47* 11/18/2015 2041   GFRAA 55* 11/18/2015 2041    [name], MD [facility] Office: [phone] Pager: [phone]  11/19/2015, 9:48 AM

## 2015-11-19 NOTE — Progress Notes (Signed)
I will begin discussions with pt and family tomorrow concerning a possible inpt rehab admission pending [employer] approval which I will initiate. OT eval pending which is needed and I will arrange. [phone]

## 2015-11-19 NOTE — Evaluation (Signed)
Physical Therapy Evaluation Patient Details Name: Evan Stanley MRN: [id number] DOB: [birth year] Today's Date: 11/19/2015   History of Present Illness  Patient is a 75 y/o male admitted POD #1 s/p R carotid angiogram complicated by TIA/CVA, LICA occlusion, high grade R ICA stenosis  Clinical Impression  Patient presents with decreased independence and safety with mobility due to deficits listed in PT problem list.  He is 100% fall risk due to poor awareness of L lateral lean, L hip/knee and trunk instability in stance and wife unable to provide sufficient level of assist at this time.  Feel patient is good candidate for CIR level therapies but lives in [place].  Feel continues skilled PT in the acute setting will address deficits and improve safety and independence prior to d/c.    Follow Up Recommendations CIR    Equipment Recommendations  Rolling walker with 5" wheels    Recommendations for Other Services Rehab consult     Precautions / Restrictions Precautions Precautions: Fall Restrictions Weight Bearing Restrictions: No      Mobility  Bed Mobility               General bed mobility comments: up in recliner  Transfers Overall transfer level: Needs assistance Equipment used: Rolling walker (2 wheeled) Transfers: Sit to/from Stand Sit to Stand: Max assist         General transfer comment: lifting assist and posterior LOB in standing, cues for hand placement and poor placement for sit to stand with R foot to far forward; stand to sit max cues for safety and mod A  Ambulation/Gait Ambulation/Gait assistance: Mod assist;Max assist Ambulation Distance (Feet): 75 Feet Assistive device: Rolling walker (2 wheeled) Gait Pattern/deviations: Step-to pattern;Decreased stride length;Trendelenburg;Decreased weight shift to right;Staggering left     General Gait Details: L hip and knee buckles in stance despite cues for stance stability and facilitation at L hip throughout;  cues for R weight shift and assist for L foot progression, cues for safety and assist with walker for shorter steps with turning for safety  Stairs            Wheelchair Mobility    Modified Rankin (Stroke Patients Only) Modified Rankin (Stroke Patients Only) Pre-Morbid Rankin Score: No symptoms Modified Rankin: Moderately severe disability     Balance Overall balance assessment: Needs assistance Sitting-balance support: Feet supported Sitting balance-Leahy Scale: Fair   Postural control: Left lateral lean;Posterior lean Standing balance support: Bilateral upper extremity supported Standing balance-Leahy Scale: Poor Standing balance comment: UE supported on walker and mod support due to posterior and L bias                             Pertinent Vitals/Pain Pain Assessment: No/denies pain    Home Living Family/patient expects to be discharged to:: Private residence Living Arrangements: Spouse/significant other Available Help at Discharge: Family;Available 24 hours/day Type of Home: House Home Access: Stairs to enter Entrance Stairs-Rails: None Entrance Stairs-Number of Steps: 1 Home Layout: Laundry or work area in basement;One level Home Equipment: None      Prior Function Level of Independence: Independent               Hand Dominance   Dominant Hand: Right    Extremity/Trunk Assessment   Upper Extremity Assessment: Overall WFL for tasks assessed           Lower Extremity Assessment: LLE deficits/detail   LLE Deficits / Details: AROM WFL,  strength hip flexion 4/5, knee extension 3+/5, ankle DF 4/5, coordination deficits with toe tapping     Communication   Communication: No difficulties  Cognition Arousal/Alertness: Awake/alert Behavior During Therapy: WFL for tasks assessed/performed Overall Cognitive Status: Within Functional Limits for tasks assessed                      General Comments General comments (skin  integrity, edema, etc.): family in room and discussed recommendations and need for inpt stay for rehab coverage    Exercises        Assessment/Plan    PT Assessment Patient needs continued PT services  PT Diagnosis Abnormality of gait;Generalized weakness;Hemiplegia non-dominant side   PT Problem List Decreased strength;Decreased knowledge of use of DME;Decreased activity tolerance;Decreased safety awareness;Decreased balance;Decreased mobility;Decreased coordination  PT Treatment Interventions DME instruction;Gait training;Stair training;Balance training;Neuromuscular re-education;Functional mobility training;Patient/family education;Therapeutic activities;Therapeutic exercise   PT Goals (Current goals can be found in the Care Plan section) Acute Rehab PT Goals Patient Stated Goal: To walk better PT Goal Formulation: With patient/family Time For Goal Achievement: 11/26/15 Potential to Achieve Goals: Good    Frequency Min 4X/week   Barriers to discharge        Co-evaluation               End of Session Equipment Utilized During Treatment: Gait belt Activity Tolerance: Patient limited by fatigue Patient left: with call bell/phone within reach;in chair;with family/visitor present      Functional Assessment Tool Used: Clinical Judgement Functional Limitation: Mobility: Walking and moving around Mobility: Walking and Moving Around Current Status [phone]): At least 60 percent but less than 80 percent impaired, limited or restricted Mobility: Walking and Moving Around Goal Status [phone]): At least 40 percent but less than 60 percent impaired, limited or restricted    Time: 0930-1005 PT Time Calculation (min) (ACUTE ONLY): 35 min   Charges:   PT Evaluation $PT Eval High Complexity: 1 Procedure PT Treatments $Gait Training: 8-22 mins   PT G Codes:   PT G-Codes **NOT FOR INPATIENT CLASS** Functional Assessment Tool Used: Clinical Judgement Functional Limitation:  Mobility: Walking and moving around Mobility: Walking and Moving Around Current Status ([id number]): At least 60 percent but less than 80 percent impaired, limited or restricted Mobility: Walking and Moving Around Goal Status [phone]): At least 40 percent but less than 60 percent impaired, limited or restricted    Reginia Naas 11/19/2015, 10:18 AM  Magda Kiel, PT [phone] 11/19/2015

## 2015-11-19 NOTE — Progress Notes (Addendum)
STROKE TEAM PROGRESS NOTE   HISTORY OF PRESENT ILLNESS Evan Stanley is an 75 y.o. male who came for elective cath for carotid disease and stroke history. Was found to have R ICA disease that will need intervention that they were planning on doing at a date sometime soon. As he was being ready to be discharged home, he tried to stand but his L leg was heavy along with his L arm. Code stroke was called. NIHSS 2, tpa not given for low NIHSS. He was last known well 11/18/2015 at 1805. Modified Rankin: Rankin Score=0. He was admitted for further evaluation and treatment.   SUBJECTIVE (INTERVAL HISTORY) His son is at the bedside.  He is up in the chair. Still with difficulty walking. Overall he feels his condition is gradually improving. Still awaiting MRI to confirm or refute stroke diagnosis. May need CEA or stent. I have personally reviewed the patient's clinical history. He has a past history of a TIA with slurred speech as well as left eye vision loss Likely from his left carotid occlusion   OBJECTIVE Temp:  [97.3 F (36.3 C)-97.8 F (36.6 C)] 97.8 F (36.6 C) (05/02 0500) Pulse Rate:  [58-183] 69 (05/02 1038) Cardiac Rhythm:  [-] Normal sinus rhythm (05/02 0732) Resp:  [10-27] 18 (05/02 0500) BP: (123-171)/(60-97) 138/64 mmHg (05/02 0500) SpO2:  [96 %-100 %] 97 % (05/02 0500)  CBC:   Recent Labs Lab 11/18/15 1141 11/18/15 2041  WBC  --  9.1  HGB 15.3 14.4  HCT 45.0 42.8  MCV  --  83.1  PLT  --  384    Basic Metabolic Panel:   Recent Labs Lab 11/18/15 1141 11/18/15 2041  NA 140 143  K 4.3 4.3  CL 101 106  CO2  --  26  GLUCOSE 188* 157*  BUN 24* 16  CREATININE 1.40* 1.43*  CALCIUM  --  9.9    Lipid Panel:     Component Value Date/Time   CHOL 176 09/20/2015 0807   TRIG 147 09/20/2015 0807   HDL 37* 09/20/2015 0807   CHOLHDL 4.8 09/20/2015 0807   LDLCALC 110* 09/20/2015 0807   HgbA1c:  Lab Results  Component Value Date   HGBA1C 7.3 09/20/2015   Urine Drug  Screen: No results found for: LABOPIA, COCAINSCRNUR, LABBENZ, AMPHETMU, THCU, [place]  Ct Head Code Stroke W/o Cm  11/18/2015  ADDENDUM REPORT: 11/18/2015 20:09 ADDENDUM: Dr. Norman Clay was paged regarding the results of this exam at [place] hours on 11/18/2015. Electronically Signed   By: Genevie Ann M.D.   On: 11/18/2015 20:09  11/18/2015  CLINICAL DATA:  75 year old male with onset of unsteady gait. Stroke-like symptoms. Initial encounter. EXAM: CT HEAD WITHOUT CONTRAST TECHNIQUE: Contiguous axial images were obtained from the base of the skull through the vertex without intravenous contrast. COMPARISON:  Neck CTA 11/02/2014. Head CT without contrast 10/22/2014. FINDINGS: Trace paranasal sinus mucosal thickening. Mastoids remain clear. No acute osseous abnormality identified. Stable orbit and scalp soft tissues. Fairly extensive calcified atherosclerosis at the skullbase re- demonstrated. Chronic cerebral volume loss is stable. No midline shift, mass effect, or evidence of intracranial mass lesion. No acute intracranial hemorrhage identified. No ventriculomegaly. No acute cortically based infarct identified. No suspicious intracranial vascular hyperdensity. Stable gray-white matter differentiation. ASPECTS score = 10. IMPRESSION: No acute cortically based infarct or acute intracranial abnormality identified. Electronically Signed: By: Genevie Ann M.D. On: 11/18/2015 19:38       PHYSICAL EXAM Pleasant elderly  obese Caucasian male not in distress. . Afebrile. Head is nontraumatic. Neck is supple with soft right greater than left carotid bruits.    Cardiac exam no murmur or gallop. Lungs are clear to auscultation. Distal pulses are well felt. Neurological Exam ;  Awake  Alert oriented x 3. Normal speech and language.eye movements full without nystagmus.fundi were not visualized. Vision acuity and fields appear normal. Hearing is normal. Palatal movements are normal. Face symmetric. Tongue  midline. Normal strength, tone, reflexes and coordination Except mild left hip flexor and ankle dorsiflexor weakness 4/5.Marland Kitchen Normal sensation. Gait deferred. ASSESSMENT/PLAN [name] is a 75 y.o. male with history of atrial fibrillation not on anticoagulation, HTN, HLD, DM, CHF, and carotid stenosis who developed clumsy left arm and leg post cerebral arteriogram. He did not receive IV t-PA due to low NIHSS.   Stroke vs TIA in setting of R ICA > 80% and L ICA occlusion  Resultant  L hemiparesis  MRI  pending   Cerebral arteriogram right ICA > 80% stenosis, L ICA occluded. R ECA occluded.   2D Echo  pending  LDL 110  HgbA1c 7.3 in March  Lovenox 40 mg sq daily for VTE prophylaxis Diet heart healthy/carb modified Room service appropriate?: Yes; Fluid consistency:: Thin  aspirin 81 mg daily prior to admission, continued in aspirin 81 mg daily . May consider change based on study results.  Ongoing aggressive stroke risk factor management  Therapy recommendations:  CIR. Consult placed  Disposition:  pending   Atrial Fibrillation, not on anticoagulation  Carotid disease  Right Carotid stenosis > 80%  R ECA occluded  L ICA occluded  Treatment per Dr. Bridgett Larsson  Hypertension  Hyperlipidemia  on Pravachol 40 continued in hospital  LDL 110 in March, goal < 70  Continue statin at discharge  Diabetes   HgbA1c 7.3 in March, goal < 7.0  Uncontrolled  Other Stroke Risk Factors  Former Cigarette smoker  Overweight, Body mass index is 29.29 kg/(m^2).   Hx stroke/TIA  Family hx stroke (father)  CHF  Carotid occlusion  Other Active Problems  Lft carotid cocclusion  Hospital day #   [place] for Pager information 11/19/2015 12:14 PM  I have personally examined this patient, reviewed notes, independently viewed imaging studies, participated in medical decision making and plan of care. I have made any additions or  clarifications directly to the above note. Agree with note above. We have ordered an MRI scan of the brain.Starleen Blue out of bed. Physical occupational therapy and rehabilitation consults. I ordered rehabilitation consult.The patient remains at risk for neurological worsening, recurrent stroke, TIA and needs ongoing stroke evaluation and aggressive risk factor modification.The duration of this   visit was 35 minutes of face-to-face time with the patient. Greater than 50% of this time was spent in counseling, explanation of diagnosis, planning of further management, and coordination of care.  , Discussion with patient and son at the bedside and answered questions.     [name], MD Medical Director [facility] Pager: [phone] 11/19/2015 2:46 PM    To contact Stroke Continuity provider, please refer to [email or web address] After hours, contact General Neurology

## 2015-11-19 NOTE — Progress Notes (Signed)
Rehab Admissions Coordinator Note:  Patient was screened by Cleatrice Burke for appropriateness for an Inpatient Acute Rehab Consult per PT recommendation.   At this time, we are recommending Inpatient Rehab consult. Please place order.  Cleatrice Burke 11/19/2015, 11:17 AM  I can be reached at [phone].

## 2015-11-19 NOTE — Progress Notes (Signed)
Pt with ground level fall after getting off bedside commode.  Says he slid down to floor and hit left knee.  Denies hitting head.    On exam no point tenderness at knee. No contusion or hematoma on head  Discussed with pt getting assistance to avoid falls no obvious injury  Ruta Hinds, MD [facility] [place]: [phone] Pager: [phone]

## 2015-11-19 NOTE — Consult Note (Signed)
Physical Medicine and Rehabilitation Consult Reason for Consult: Acute nonhemorrhagic infarct posterior right frontal lobe, parietal lobe Referring Physician: Dr. Bridgett Larsson   HPI: Evan Stanley is a 75 y.o. right handed male with history of hypertension, diabetes mellitus, diastolic congestive heart failure, atrial fibrillation maintained on aspirin 81 mg as well as TIA in the past. Patient lives with spouse independent prior to admission. One level home. Wife works during the day. Presented 11/18/2015 with known carotid artery stenosis diagnosed April 2016 by cardiology services right ICA greater than 80%, left ICA occluded. Underwent elective right carotid angiogram 11/18/2015 per Dr. Bridgett Larsson for workup and plan carotid surgery. Noted post angiogram with left sided weakness. Code stroke was called. CT of the head showed no acute cortically based infarct or acute intracranial abnormality. MRI of the brain showed scattered foci of acute nonhemorrhagic infarct involving the posterior right frontal lobe, right parietal lobe . Neurology follow-up with workup ongoing. Echocardiogram pending. Currently maintained on aspirin 81 mg daily. Subcutaneous Lovenox for DVT prophylaxis. Regular consistency diet. Physical therapy evaluation completed 11/19/2015 with recommendations of physical medicine rehabilitation consult.   Review of Systems  Constitutional: Negative for fever and chills.  HENT: Negative for hearing loss.   Eyes: Positive for blurred vision. Negative for double vision.  Respiratory: Negative for cough and shortness of breath.   Cardiovascular: Positive for palpitations and leg swelling. Negative for chest pain.  Gastrointestinal: Positive for constipation. Negative for nausea and vomiting.  Genitourinary: Positive for dysuria. Negative for hematuria.  Musculoskeletal: Positive for myalgias and joint pain.  Neurological: Positive for dizziness and weakness. Negative for seizures, loss of  consciousness and headaches.  Psychiatric/Behavioral:       Anxiety  All other systems reviewed and are negative.  Past Medical History  Diagnosis Date  . Diabetes mellitus without complication ([place])   . Hyperlipidemia   . Hypertension   . CHF (congestive heart failure) ([place])   . Anxiety   . Atrial fibrillation ([place])   . Carotid artery stenosis   . TIA (transient ischemic attack)   . Decreased vision     left eye   Past Surgical History  Procedure Laterality Date  . Cardiac valve replacement    . Aortic valve replacement    . Back surgery    . Laser surgery, left eye  Left 10-29-2015     retinal surgery/ Deloria Lair MD   . Peripheral vascular catheterization Right 11/18/2015    Procedure: Carotid Angiography;  Surgeon: Conrad Brookville, MD;  Location: [facility];  Service: Cardiovascular;  Laterality: Right;  . Peripheral vascular catheterization N/A 11/18/2015    Procedure: Aortic Arch Angiography;  Surgeon: Conrad Etow[place], MD;  Location: [facility];  Service: Cardiovascular;  Laterality: N/A;   Family History  Problem Relation Age of Onset  . Heart disease Mother   . Stroke Father 42   Social History:  reports that he quit smoking about 31 years ago. He has never used smokeless tobacco. He reports that he does not drink alcohol or use illicit drugs. Allergies: No Known Allergies Medications Prior to Admission  Medication Sig Dispense Refill  . amLODipine (NORVASC) 10 MG tablet Take 1 tablet (10 mg total) by mouth daily. 90 tablet 1  . aspirin EC 81 MG tablet Take 1 tablet (81 mg total) by mouth daily. 90 tablet 3  . hydrochlorothiazide (HYDRODIURIL) 25 MG tablet Take 1 tablet (25 mg total) by mouth daily. 90 tablet  1  . lisinopril (PRINIVIL,ZESTRIL) 40 MG tablet TAKE 1 TABLET DAILY 90 tablet 1  . metFORMIN (GLUCOPHAGE) 1000 MG tablet Take 1 tablet (1,000 mg total) by mouth 2 (two) times daily with a meal. 180 tablet 1  . metoprolol succinate (TOPROL-XL) 100 MG 24  hr tablet Take 1 tablet (100 mg total) by mouth daily. Take with or immediately following a meal. 90 tablet 1  . pravastatin (PRAVACHOL) 40 MG tablet Take 40 mg by mouth daily.    Marland Kitchen terazosin (HYTRIN) 1 MG capsule Take 1 capsule (1 mg total) by mouth at bedtime. (Patient not taking: Reported on 09/20/2015) 90 capsule 1    Home: [place] expects to be discharged to:: Private residence Living Arrangements: Spouse/significant other Available Help at Discharge: Family, Available 24 hours/day Type of Home: House Home Access: Stairs to enter Technical brewer of Steps: 1 Entrance Stairs-Rails: None Home Layout: Laundry or work area in basement, One level [employer] Shower/Tub: Multimedia programmer: [place]: None  Functional History: Prior Function Level of Independence: Independent Functional Status:  Mobility: Bed Mobility General bed mobility comments: up in [place] Overall transfer level: Needs assistance Equipment used: Rolling walker (2 wheeled) Transfers: Sit to/from Stand Sit to Stand: Max assist General transfer comment: lifting assist and posterior LOB in standing, cues for hand placement and poor placement for sit to stand with R foot to far forward; stand to sit max cues for safety and mod A Ambulation/Gait Ambulation/Gait assistance: Mod assist, Max assist Ambulation Distance (Feet): 75 Feet Assistive device: Rolling walker (2 wheeled) Gait Pattern/deviations: Step-to pattern, Decreased stride length, Trendelenburg, Decreased weight shift to right, Staggering left General Gait Details: L hip and knee buckles in stance despite cues for stance stability and facilitation at L hip throughout; cues for R weight shift and assist for L foot progression, cues for safety and assist with walker for shorter steps with turning for safety    ADL:    Cognition: Cognition Overall Cognitive Status: Within Functional Limits for  tasks assessed Orientation Level: Oriented X4 Cognition Arousal/Alertness: Awake/alert Behavior During Therapy: WFL for tasks assessed/performed Overall Cognitive Status: Within Functional Limits for tasks assessed  Blood pressure 138/64, pulse 69, temperature 97.8 F (36.6 C), temperature source Oral, resp. rate 18, height 5' 5"  (1.651 m), weight 79.833 kg (176 lb), SpO2 97 %. Physical Exam  Vitals reviewed. Constitutional: He is oriented to person, place, and time. He appears well-developed.  HENT:  Head: Normocephalic.  Eyes: EOM are normal.  Neck: Normal range of motion. Neck supple. No thyromegaly present.  Cardiovascular:  Cardiac rate control  Respiratory: Effort normal and breath sounds normal. No respiratory distress.  GI: Soft. Bowel sounds are normal. He exhibits no distension.  Neurological: He is alert and oriented to person, place, and time.  Good awareness and insight. Incomplete LLQ field cut. Seems to be fairly aware of left side. Follows commands. Moves all extremities. Decreased fine motor skills on left. Mild left limb ataxia. MMT: 5/5 RUE and 4+/5 LUE. RLE: 4+ to 5/5. LLE: 4 to 4/5. Senses pain on left side.   Skin: Skin is warm and dry.    Results for orders placed or performed during the hospital encounter of 11/18/15 (from the past 24 hour(s))  Glucose, capillary     Status: Abnormal   Collection Time: 11/18/15  1:40 PM  Result Value Ref Range   Glucose-Capillary 156 (H) 65 - 99 mg/dL  Glucose, capillary  Status: Abnormal   Collection Time: 11/18/15  6:53 PM  Result Value Ref Range   Glucose-Capillary 173 (H) 65 - 99 mg/dL   Comment 1 Notify RN   CBC     Status: None   Collection Time: 11/18/15  8:41 PM  Result Value Ref Range   WBC 9.1 4.0 - 10.5 K/uL   RBC 5.15 4.22 - 5.81 MIL/uL   Hemoglobin 14.4 13.0 - 17.0 g/dL   HCT 42.8 39.0 - 52.0 %   MCV 83.1 78.0 - 100.0 fL   MCH 28.0 26.0 - 34.0 pg   MCHC 33.6 30.0 - 36.0 g/dL   RDW 13.3 11.5 - 15.5 %    Platelets 211 150 - 400 K/uL  Comprehensive metabolic panel     Status: Abnormal   Collection Time: 11/18/15  8:41 PM  Result Value Ref Range   Sodium 143 135 - 145 mmol/L   Potassium 4.3 3.5 - 5.1 mmol/L   Chloride 106 101 - 111 mmol/L   CO2 26 22 - 32 mmol/L   Glucose, Bld 157 (H) 65 - 99 mg/dL   BUN 16 6 - 20 mg/dL   Creatinine, Ser 1.43 (H) 0.61 - 1.24 mg/dL   Calcium 9.9 8.9 - 10.3 mg/dL   Total Protein 6.9 6.5 - 8.1 g/dL   Albumin 4.1 3.5 - 5.0 g/dL   AST 19 15 - 41 U/L   ALT 25 17 - 63 U/L   Alkaline Phosphatase 53 38 - 126 U/L   Total Bilirubin 0.7 0.3 - 1.2 mg/dL   GFR calc non Af Amer 47 (L) >60 mL/min   GFR calc Af Amer 55 (L) >60 mL/min   Anion gap 11 5 - 15  Protime-INR     Status: None   Collection Time: 11/18/15  8:41 PM  Result Value Ref Range   Prothrombin Time 13.6 11.6 - 15.2 seconds   INR 1.02 0.00 - 1.49  Urinalysis, Routine w reflex microscopic (not at [facility])     Status: None   Collection Time: 11/18/15 10:24 PM  Result Value Ref Range   Color, Urine YELLOW YELLOW   APPearance CLEAR CLEAR   Specific Gravity, Urine 1.015 1.005 - 1.030   pH 7.5 5.0 - 8.0   Glucose, UA NEGATIVE NEGATIVE mg/dL   Hgb urine dipstick NEGATIVE NEGATIVE   Bilirubin Urine NEGATIVE NEGATIVE   Ketones, ur NEGATIVE NEGATIVE mg/dL   Protein, ur NEGATIVE NEGATIVE mg/dL   Nitrite NEGATIVE NEGATIVE   Leukocytes, UA NEGATIVE NEGATIVE  Glucose, capillary     Status: Abnormal   Collection Time: 11/19/15 12:33 PM  Result Value Ref Range   Glucose-Capillary 195 (H) 65 - 99 mg/dL   Ct Head Code Stroke W/o Cm  11/18/2015  ADDENDUM REPORT: 11/18/2015 20:09 ADDENDUM: Dr. Norman Clay was paged regarding the results of this exam at [place] hours on 11/18/2015. Electronically Signed   By: Genevie Ann M.D.   On: 11/18/2015 20:09  11/18/2015  CLINICAL DATA:  75 year old male with onset of unsteady gait. Stroke-like symptoms. Initial encounter. EXAM: CT HEAD WITHOUT CONTRAST TECHNIQUE: Contiguous  axial images were obtained from the base of the skull through the vertex without intravenous contrast. COMPARISON:  Neck CTA 11/02/2014. Head CT without contrast 10/22/2014. FINDINGS: Trace paranasal sinus mucosal thickening. Mastoids remain clear. No acute osseous abnormality identified. Stable orbit and scalp soft tissues. Fairly extensive calcified atherosclerosis at the skullbase re- demonstrated. Chronic cerebral volume loss is stable. No midline shift, mass effect, or  evidence of intracranial mass lesion. No acute intracranial hemorrhage identified. No ventriculomegaly. No acute cortically based infarct identified. No suspicious intracranial vascular hyperdensity. Stable gray-white matter differentiation. ASPECTS score = 10. IMPRESSION: No acute cortically based infarct or acute intracranial abnormality identified. Electronically Signed: By: Genevie Ann M.D. On: 11/18/2015 19:38    Assessment/Plan: Diagnosis: right frontal-parietal infarct with left hemiparesis and balance deficits 1. Does the need for close, 24 hr/day medical supervision in concert with the patient's rehab needs make it unreasonable for this patient to be served in a less intensive setting? Yes 2. Co-Morbidities requiring supervision/potential complications: htn, cad, dm, CAS 3. Due to bladder management, bowel management, safety, skin/wound care, disease management, medication administration, pain management and patient education, does the patient require 24 hr/day rehab nursing? Yes 4. Does the patient require coordinated care of a physician, rehab nurse, PT (1-2 hrs/day, 5 days/week) and OT (1-2 hrs/day, 5 days/week) to address physical and functional deficits in the context of the above medical diagnosis(es)? Yes Addressing deficits in the following areas: balance, endurance, locomotion, strength, transferring, bowel/bladder control, bathing, dressing, feeding, grooming, toileting and psychosocial support 5. Can the patient actively  participate in an intensive therapy program of at least 3 hrs of therapy per day at least 5 days per week? Yes 6. The potential for patient to make measurable gains while on inpatient rehab is excellent 7. Anticipated functional outcomes upon discharge from inpatient rehab are modified independent  with PT, modified independent with OT, n/a with SLP. 8. Estimated rehab length of stay to reach the above functional goals is: 7-9 days 9. Does the patient have adequate social supports and living environment to accommodate these discharge functional goals? Yes 10. Anticipated D/C setting: Home 11. Anticipated post D/C treatments: HH therapy and Outpatient therapy 12. Overall Rehab/Functional Prognosis: excellent  RECOMMENDATIONS: This patient's condition is appropriate for continued rehabilitative care in the following setting: CIR Patient has agreed to participate in recommended program. Yes Note that insurance prior authorization may be required for reimbursement for recommended care.  Comment: Rehab Admissions Coordinator to follow up.  Thanks,  Meredith Staggers, MD, Mellody Drown     11/19/2015

## 2015-11-20 ENCOUNTER — Encounter (HOSPITAL_COMMUNITY): Payer: Self-pay | Admitting: Family

## 2015-11-20 ENCOUNTER — Inpatient Hospital Stay (HOSPITAL_COMMUNITY): Payer: BLUE CROSS/BLUE SHIELD

## 2015-11-20 DIAGNOSIS — I6789 Other cerebrovascular disease: Secondary | ICD-10-CM

## 2015-11-20 LAB — GLUCOSE, CAPILLARY
GLUCOSE-CAPILLARY: 154 mg/dL — AB (ref 65–99)
GLUCOSE-CAPILLARY: 197 mg/dL — AB (ref 65–99)
Glucose-Capillary: 154 mg/dL — ABNORMAL HIGH (ref 65–99)
Glucose-Capillary: 150 mg/dL — ABNORMAL HIGH (ref 65–99)

## 2015-11-20 LAB — ECHOCARDIOGRAM COMPLETE
HEIGHTINCHES: 65 in
WEIGHTICAEL: 2816 [oz_av]

## 2015-11-20 MED ORDER — METFORMIN HCL 500 MG PO TABS
1000.0000 mg | ORAL_TABLET | Freq: Two times a day (BID) | ORAL | Status: DC
  Administered 2015-11-21 – 2015-11-22 (×3): 1000 mg via ORAL
  Filled 2015-11-20 (×4): qty 2

## 2015-11-20 NOTE — Evaluation (Signed)
Occupational Therapy Evaluation Patient Details Name: Evan Stanley MRN: [id number] DOB: 1940/08/06 Today's Date: 11/20/2015    History of Present Illness Patient is a 75 y/o male admitted POD #1 s/p R carotid angiogram complicated by TIA/CVA, LICA occlusion, high grade R ICA stenosis; MRI of the brain showed scattered foci of acute nonhemorrhagic infarct involving the posterior right frontal lobe, right parietal lobe    Clinical Impression   Pt was independent prior to admission. Presents with decreased safety and awareness of deficits, poor sitting and standing balance and L side weakness interfering with ability to perform ADL and ADL transfers at his baseline. Pt with baseline low vision in L eye, reports no vision changes since CVA. Pt with posterior and L bias in sitting and standing. He is highly motivated to return to independence and has a supportive wife.  Will follow acutely. Pt with excellent rehab potential, recommending inpatient rehab.  Follow Up Recommendations  CIR    Equipment Recommendations  3 in 1 bedside comode    Recommendations for Other Services       Precautions / Restrictions Precautions Precautions: Fall Restrictions Weight Bearing Restrictions: No      Mobility Bed Mobility Overal bed mobility: Needs Assistance Bed Mobility: Supine to Sit     Supine to sit: Mod assist     General bed mobility comments: assist to raise trunk and pivot hips with pad to EOB  Transfers Overall transfer level: Needs assistance Equipment used: Rolling walker (2 wheeled) Transfers: Sit to/from [facility] Sit to Stand: Mod assist Stand pivot transfers: Mod assist       General transfer comment: Cues for hand placement and to rise keeping weight shifted anterior, pt with posterior and L bias    Balance Overall balance assessment: Needs assistance   Sitting balance-Leahy Scale: Poor Sitting balance - Comments: requires assist for balance with UB  use during ADL, min guard otherwise     Standing balance-Leahy Scale: Poor Standing balance comment: Worked on weight shift forward in standing including heel raises; brief periods of not needing assist in standing, but continued close guard                            ADL Overall ADL's : Needs assistance/impaired Eating/Feeding: Independent;Sitting Eating/Feeding Details (indicate cue type and reason): pt demonstrating ability to open containers on meal tray Grooming: Wash/dry hands;Sitting;Set up   Upper Body Bathing: Minimal assitance;Sitting   Lower Body Bathing: Maximal assistance;Sit to/from stand   Upper Body Dressing : Minimal assistance;Sitting   Lower Body Dressing: Maximal assistance;Sit to/from stand   Toilet Transfer: Moderate assistance;Stand-pivot;BSC;RW   Toileting- Clothing Manipulation and Hygiene: Maximal assistance;Sit to/from stand         General ADL Comments: Pt not able to release walker in standing to perform grooming or to stand to pull up pants, poor sitting balance and L LE weakness interfering with ability to don sock.     Vision Additional Comments: Pt with low vision in L eye. Per wife "he had a stroke in that eye", has had multiple procedures in attempt to save vision and the eyeball itself.   Perception     Praxis      Pertinent Vitals/Pain Pain Assessment: No/denies pain Pain Score: 7  Pain Location: bil knees, R greater than L; attributes pain to "being old" Pain Descriptors / Indicators: Aching Pain Intervention(s): Monitored during session;Patient requesting pain meds-RN notified  Hand Dominance Right   Extremity/Trunk Assessment Upper Extremity Assessment Upper Extremity Assessment: LUE deficits/detail LUE Deficits / Details: 4/5 shoulder, 4+/5 elbow to hand   Lower Extremity Assessment Lower Extremity Assessment: Defer to PT evaluation       Communication Communication Communication: No difficulties    Cognition Arousal/Alertness: Awake/alert Behavior During Therapy: WFL for tasks assessed/performed Overall Cognitive Status: Impaired/Different from baseline Area of Impairment: Safety/judgement         Safety/Judgement: Decreased awareness of safety;Decreased awareness of deficits     General Comments: pt with fall attempting OOB by himself   General Comments       Exercises Exercises: Other exercises Other Exercises Other Exercises: Heel-to-shin coordination exercised R and LLEs   Shoulder Instructions      Home Living Family/patient expects to be discharged to:: Private residence Living Arrangements: Spouse/significant other Available Help at Discharge: Family;Available 24 hours/day Type of Home: House Home Access: Stairs to enter [employer] of Steps: 1 Entrance Stairs-Rails: None Home Layout: Laundry or work area in basement;One level     Bathroom Shower/Tub: Occupational psychologist: Standard     Home Equipment: None          Prior Functioning/Environment Level of Independence: Independent             OT Diagnosis: Generalized weakness;Cognitive deficits;Blindness and low vision;Hemiplegia non-dominant side   OT Problem List: Decreased strength;Decreased activity tolerance;Impaired balance (sitting and/or standing);Impaired vision/perception;Decreased safety awareness;Decreased knowledge of use of DME or AE   OT Treatment/Interventions: Self-care/ADL training;Neuromuscular education;DME and/or AE instruction;Therapeutic activities;Cognitive remediation/compensation;Patient/family education;Balance training    OT Goals(Current goals can be found in the care plan section) Acute Rehab OT Goals Patient Stated Goal: To walk better OT Goal Formulation: With patient Time For Goal Achievement: 12/04/15 Potential to Achieve Goals: Good ADL Goals Pt Will Perform Grooming: with min guard assist;standing Pt Will Perform Lower Body  Bathing: with min guard assist;sit to/from stand Pt Will Perform Lower Body Dressing: with min guard assist;sit to/from stand Pt Will Transfer to Toilet: with min guard assist;ambulating;bedside commode (over toilet) Pt Will Perform Toileting - Clothing Manipulation and hygiene: with min guard assist;sit to/from stand Pt Will Perform Tub/Shower Transfer: Shower transfer;with min guard assist;ambulating;3 in 1;rolling walker Pt/caregiver will Perform Home Exercise Program: Increased strength;Left upper extremity;With theraband;Independently (L shoulder, level 2)  OT Frequency: Min 3X/week   Barriers to D/C:            Co-evaluation              End of Session Equipment Utilized During Treatment: Gait belt;Rolling walker  Activity Tolerance: Patient tolerated treatment well Patient left: in chair;with call bell/phone within reach;with chair alarm set;with family/visitor present;with nursing/sitter in room   Time: 1209-1227 OT Time Calculation (min): 18 min Charges:  OT General Charges $OT Visit: 1 Procedure OT Evaluation $OT Eval Moderate Complexity: 1 Procedure G-Codes:    [name] 11/20/2015, 12:40 PM  [phone]

## 2015-11-20 NOTE — Progress Notes (Addendum)
Progress Note    11/20/2015 8:27 AM 2 Days Post-Op  Subjective:  States he is tired of being in the hospital; c/o left knee pain  Afebrile HR 50's-60's NSR 656'C-127'N systolic 17% RA  Filed Vitals:   11/19/15 2243 11/20/15 0600  BP: 146/61 130/60  Pulse: 56 68  Temp: 97.8 F (36.6 C) 98.4 F (36.9 C)  Resp: 20     Physical Exam: Lungs:  Non labored Incisions:  Right groin bandage removed; groin soft without hematoma Neuro:  5/5 BUE  5/5 RLE 3-4/5 LLE; smile symmetrical    CBC    Component Value Date/Time   WBC 9.1 11/18/2015 2041   WBC 8.7 04/16/2014 1002   RBC 5.15 11/18/2015 2041   RBC 5.4 04/16/2014 1002   HGB 14.4 11/18/2015 2041   HGB 15.0 04/16/2014 1002   HCT 42.8 11/18/2015 2041   HCT 45.2 04/16/2014 1002   PLT 211 11/18/2015 2041   MCV 83.1 11/18/2015 2041   MCV 83.8 04/16/2014 1002   MCH 28.0 11/18/2015 2041   MCH 27.8 04/16/2014 1002   MCHC 33.6 11/18/2015 2041   MCHC 33.1 04/16/2014 1002   RDW 13.3 11/18/2015 2041   LYMPHSABS 2.5 10/22/2014 1245   MONOABS 0.9 10/22/2014 1245   EOSABS 0.2 10/22/2014 1245   BASOSABS 0.1 10/22/2014 1245    BMET    Component Value Date/Time   NA 143 11/18/2015 2041   NA 141 09/20/2015 0807   K 4.3 11/18/2015 2041   CL 106 11/18/2015 2041   CO2 26 11/18/2015 2041   GLUCOSE 157* 11/18/2015 2041   GLUCOSE 195* 09/20/2015 0807   BUN 16 11/18/2015 2041   BUN 12 09/20/2015 0807   CREATININE 1.43* 11/18/2015 2041   CALCIUM 9.9 11/18/2015 2041   GFRNONAA 47* 11/18/2015 2041   GFRAA 55* 11/18/2015 2041    INR    Component Value Date/Time   INR 1.02 11/18/2015 2041     Intake/Output Summary (Last 24 hours) at 11/20/15 0827 Last data filed at 11/19/15 0900  Gross per 24 hour  Intake    240 ml  Output    301 ml  Net    -61 ml   MRI 11/19/15: IMPRESSION: 1. Scattered foci of acute nonhemorrhagic infarction involving the posterior right frontal lobe, the right parietal lobe, in the anterior left  frontal lobe. 2. Moderate age advanced atrophy and scattered white matter changes bilaterally compatible with chronic microvascular ischemic disease. 3. Abnormal signal compatible with occlusion in the left internal carotid proximal to the ophthalmic segment and in the distal right vertebral artery.   Assessment:  75 y.o. male is s/p:  1. Right common femoral artery cannulation under ultrasound guidance 2. Placement of catheter in aorta 3. Arch Aortogram 4. Right carotid and cerebral angiogram  2 Days Post-Op  Plan: -CIR consulted-will await their recommendations   -was on BSC last evening and slid down to the ground when trying to get back in bed.   -c/o left knee pain-states he kicked the bed last night-no contusion or swelling of left knee. -continue OT/PT  -f/u with Dr. Bridgett Larsson in a couple of weeks for discussion of intervention.    Leontine Locket, PA-C [facility] [phone] 11/20/2015 8:27 AM  Radiology: Mr Brain Wo Contrast  11/19/2015  CLINICAL DATA:  Left upper and lower extremity weakness. New onset prior to discharge. EXAM: MRI HEAD WITHOUT CONTRAST TECHNIQUE: Multiplanar, multiecho pulse sequences of the brain and surrounding structures were obtained without intravenous  contrast. COMPARISON:  CT of the head without contrast 11/18/2015 FINDINGS: Multiple scattered foci of restricted diffusion are evident within the precentral gyrus. There is at least 1 focus in the postcentral gyrus. There is also a focus of restricted diffusion measuring just over 2 cm adjacent to the right lateral ventricle. A single punctate focus is present within the left middle frontal gyrus on image 25 series 4. Moderate generalized atrophy is present. The ventricles are proportionate to the degree of atrophy. T2 changes are associated with the areas of restricted diffusion. There is a remote lacunar infarct in the left coronal radiata. Scattered subcortical T2 changes are present  bilaterally. The internal auditory canals are within normal limits bilaterally. The brainstem and cerebellum are unremarkable. The right vertebral artery is occluded. The left internal carotid artery is occluded. Flow is present in the left vertebral artery in the basilar artery. Flow is present in the right internal carotid artery at the skullbase and the left internal carotid artery above the ophthalmic segment. The left lens has been replaced. The globes and orbits are otherwise intact. Mild mucosal thickening is present throughout the ethmoid air cells and within the frontal sinuses bilaterally. The remaining paranasal sinuses are clear. The mastoid air cells are clear. IMPRESSION: 1. Scattered foci of acute nonhemorrhagic infarction involving the posterior right frontal lobe, the right parietal lobe, in the anterior left frontal lobe. 2. Moderate age advanced atrophy and scattered white matter changes bilaterally compatible with chronic microvascular ischemic disease. 3. Abnormal signal compatible with occlusion in the left internal carotid proximal to the ophthalmic segment and in the distal right vertebral artery. These results were called by telephone at the time of interpretation on 11/19/2015 at 2:24 pm to [place] , who verbally acknowledged these results. Electronically Signed   By: San Morelle M.D.   On: 11/19/2015 14:29   Ct Head Code Stroke W/o Cm  11/18/2015  ADDENDUM REPORT: 11/18/2015 20:09 ADDENDUM: Dr. Norman Clay was paged regarding the results of this exam at [place] hours on 11/18/2015. Electronically Signed   By: Genevie Ann M.D.   On: 11/18/2015 20:09  11/18/2015  CLINICAL DATA:  75 year old male with onset of unsteady gait. Stroke-like symptoms. Initial encounter. EXAM: CT HEAD WITHOUT CONTRAST TECHNIQUE: Contiguous axial images were obtained from the base of the skull through the vertex without intravenous contrast. COMPARISON:  Neck CTA 11/02/2014. Head CT without contrast  10/22/2014. FINDINGS: Trace paranasal sinus mucosal thickening. Mastoids remain clear. No acute osseous abnormality identified. Stable orbit and scalp soft tissues. Fairly extensive calcified atherosclerosis at the skullbase re- demonstrated. Chronic cerebral volume loss is stable. No midline shift, mass effect, or evidence of intracranial mass lesion. No acute intracranial hemorrhage identified. No ventriculomegaly. No acute cortically based infarct identified. No suspicious intracranial vascular hyperdensity. Stable gray-white matter differentiation. ASPECTS score = 10. IMPRESSION: No acute cortically based infarct or acute intracranial abnormality identified. Electronically Signed: By: Genevie Ann M.D. On: 11/18/2015 19:38    Addendum  I have independently interviewed and examined the patient, and I agree with the physician assistant's findings.  LLE strength better today.  Some difficulty with left knee after falling on it last night and trying to kick off his blanket.  Looks like he is going to CIR once bed available.   - R carotid angiogram suggests surgically accessible R ICA stenosis >80% - CVA occurred during a easy R carotid angiogram, so I suspect a he is likely to have a repeat CVA with a  carotid artery stent placement - will see him back in the office in 4 weeks to re-evaluate for a R CEA  [name], MD [facility] Office: [phone] Pager: [phone]  11/20/2015, 3:13 PM

## 2015-11-20 NOTE — Progress Notes (Signed)
Echocardiogram 2D Echocardiogram has been performed.  Evan Stanley 11/20/2015, 4:26 PM

## 2015-11-20 NOTE — Evaluation (Signed)
Speech Language Pathology Evaluation Patient Details Name: Evan Stanley MRN: [id number] DOB: Apr 05, 1941 Today's Date: 11/20/2015 Time: 7510-2585 SLP Time Calculation (min) (ACUTE ONLY): 26 min  Problem List:  Patient Active Problem List   Diagnosis Date Noted  . Acute CVA (cerebrovascular accident) ([place]) 11/19/2015  . Carotid artery disease ([place]) 11/18/2015  . Carotid artery occlusion without infarction 10/26/2014  . Carotid stenosis 10/26/2014  . Status post aortic valve replacement with bioprosthetic valve 07/05/2013  . Hyperlipidemia 07/05/2013  . Essential hypertension 07/05/2013  . CAD (coronary artery disease)   . DM (diabetes mellitus) ([place])    Past Medical History:  Past Medical History  Diagnosis Date  . Diabetes mellitus without complication ([place])   . Hyperlipidemia   . Hypertension   . CHF (congestive heart failure) ([place])   . Anxiety   . Atrial fibrillation ([place])   . Carotid artery stenosis   . TIA (transient ischemic attack)   . Decreased vision     left eye   Past Surgical History:  Past Surgical History  Procedure Laterality Date  . Cardiac valve replacement    . Aortic valve replacement    . Back surgery    . Laser surgery, left eye  Left 10-29-2015     retinal surgery/ Deloria Lair MD   . Peripheral vascular catheterization Right 11/18/2015    Procedure: Carotid Angiography;  Surgeon: Conrad Tedrow, MD;  Location: [facility];  Service: Cardiovascular;  Laterality: Right;  . Peripheral vascular catheterization N/A 11/18/2015    Procedure: Aortic Arch Angiography;  Surgeon: Conrad [place], MD;  Location: [facility];  Service: Cardiovascular;  Laterality: N/A;   HPI:  Patient is a 75 y/o male admitted POD #1 s/p R carotid angiogram complicated by TIA/CVA, LICA occlusion, high grade R ICA stenosis; MRI of the brain showed scattered foci of acute nonhemorrhagic infarct involving the posterior right frontal lobe, right parietal lobe    Assessment /  Plan / Recommendation Clinical Impression  Pt presents with mild deficits in short-term and working memory, awareness, and abstract reasoning s/p right CVA. He was quite active and social PTA.  Recommend SLP f/u at CIR to address the aforementioned deficits.  Pt/wife agree with plan.     SLP Assessment  Patient needs continued Speech Lanaguage Pathology Services    Follow Up Recommendations  Inpatient Rehab    Frequency and Duration min 1 x/week  1 week      SLP Evaluation Prior Functioning  Cognitive/Linguistic Baseline: Within functional limits Type of Home: House  Lives With: Spouse Available Help at Discharge: Family;Available 24 hours/day Vocation: Retired Producer, television/film/video)   Cognition  Overall Cognitive Status: Impaired/Different from baseline Arousal/Alertness: Awake/alert Orientation Level: Oriented X4 Attention: Selective Selective Attention: Appears intact Memory: Impaired Memory Impairment: Storage deficit;Retrieval deficit;Decreased short term memory;Prospective memory Decreased Short Term Memory: Verbal basic Awareness: Impaired Awareness Impairment: Emergent impairment Safety/Judgment: Impaired    Comprehension  Auditory Comprehension Overall Auditory Comprehension: Appears within functional limits for tasks assessed Visual Recognition/Discrimination Discrimination: Within Function Limits Reading Comprehension Reading Status: Not tested    Expression Expression Primary Mode of Expression: Verbal Verbal Expression Overall Verbal Expression: Appears within functional limits for tasks assessed Written Expression Dominant Hand: Right Written Expression: Not tested   Oral / Motor  Oral Motor/Sensory Function Overall Oral Motor/Sensory Function: Mild impairment Facial ROM: Reduced left;Suspected CN VII (facial) dysfunction Facial Symmetry: Abnormal symmetry left;Suspected CN VII (facial) dysfunction Motor Speech Overall Motor Speech: Appears within  functional  limits for tasks assessed   GO                    Juan Quam Laurice 11/20/2015, 3:02 PM

## 2015-11-20 NOTE — Progress Notes (Signed)
STROKE TEAM PROGRESS NOTE   HISTORY OF PRESENT ILLNESS Richey Doolittle is an 75 y.o. male who came for elective cath for carotid disease and stroke history. Was found to have R ICA disease that will need intervention that they were planning on doing at a date sometime soon. As he was being ready to be discharged home, he tried to stand but his L leg was heavy along with his L arm. Code stroke was called. NIHSS 2, tpa not given for low NIHSS. He was last known well 11/18/2015 at 1805. Modified Rankin: Rankin Score=0. He was admitted for further evaluation and treatment.   SUBJECTIVE (INTERVAL HISTORY) His son is at the bedside.  He is up in the chair. Still with difficulty walking. Overall he feels his condition is gradually improving.   MRI  confirms  Scattered acute infarcts involving posterior right frontal, right parietal and left anterior frontal lobes.Left leg strength is improving but not back to baseline OBJECTIVE Temp:  [97.8 F (36.6 C)-98.4 F (36.9 C)] 98.3 F (36.8 C) (05/03 1401) Pulse Rate:  [56-68] 61 (05/03 1401) Cardiac Rhythm:  [-] Normal sinus rhythm (05/03 0729) Resp:  [18-22] 18 (05/03 1401) BP: (111-146)/(53-71) 111/53 mmHg (05/03 1401) SpO2:  [98 %-100 %] 98 % (05/03 1401)  CBC:   Recent Labs Lab 11/18/15 1141 11/18/15 2041  WBC  --  9.1  HGB 15.3 14.4  HCT 45.0 42.8  MCV  --  83.1  PLT  --  299    Basic Metabolic Panel:   Recent Labs Lab 11/18/15 1141 11/18/15 2041  NA 140 143  K 4.3 4.3  CL 101 106  CO2  --  26  GLUCOSE 188* 157*  BUN 24* 16  CREATININE 1.40* 1.43*  CALCIUM  --  9.9    Lipid Panel:     Component Value Date/Time   CHOL 176 09/20/2015 0807   TRIG 147 09/20/2015 0807   HDL 37* 09/20/2015 0807   CHOLHDL 4.8 09/20/2015 0807   LDLCALC 110* 09/20/2015 0807   HgbA1c:  Lab Results  Component Value Date   HGBA1C 7.3 09/20/2015   Urine Drug Screen: No results found for: LABOPIA, COCAINSCRNUR, LABBENZ, AMPHETMU, THCU, LABBARB     IMAGING  Mr Brain Wo Contrast  11/19/2015  CLINICAL DATA:  Left upper and lower extremity weakness. New onset prior to discharge. EXAM: MRI HEAD WITHOUT CONTRAST TECHNIQUE: Multiplanar, multiecho pulse sequences of the brain and surrounding structures were obtained without intravenous contrast. COMPARISON:  CT of the head without contrast 11/18/2015 FINDINGS: Multiple scattered foci of restricted diffusion are evident within the precentral gyrus. There is at least 1 focus in the postcentral gyrus. There is also a focus of restricted diffusion measuring just over 2 cm adjacent to the right lateral ventricle. A single punctate focus is present within the left middle frontal gyrus on image 25 series 4. Moderate generalized atrophy is present. The ventricles are proportionate to the degree of atrophy. T2 changes are associated with the areas of restricted diffusion. There is a remote lacunar infarct in the left coronal radiata. Scattered subcortical T2 changes are present bilaterally. The internal auditory canals are within normal limits bilaterally. The brainstem and cerebellum are unremarkable. The right vertebral artery is occluded. The left internal carotid artery is occluded. Flow is present in the left vertebral artery in the basilar artery. Flow is present in the right internal carotid artery at the skullbase and the left internal carotid artery above the ophthalmic segment. The left  lens has been replaced. The globes and orbits are otherwise intact. Mild mucosal thickening is present throughout the ethmoid air cells and within the frontal sinuses bilaterally. The remaining paranasal sinuses are clear. The mastoid air cells are clear. IMPRESSION: 1. Scattered foci of acute nonhemorrhagic infarction involving the posterior right frontal lobe, the right parietal lobe, in the anterior left frontal lobe. 2. Moderate age advanced atrophy and scattered white matter changes bilaterally compatible with chronic  microvascular ischemic disease. 3. Abnormal signal compatible with occlusion in the left internal carotid proximal to the ophthalmic segment and in the distal right vertebral artery. These results were called by telephone at the time of interpretation on 11/19/2015 at 2:24 pm to [place] , who verbally acknowledged these results. Electronically Signed   By: San Morelle M.D.   On: 11/19/2015 14:29   Ct Head Code Stroke W/o Cm  11/18/2015  ADDENDUM REPORT: 11/18/2015 20:09 ADDENDUM: Dr. Norman Clay was paged regarding the results of this exam at [place] hours on 11/18/2015. Electronically Signed   By: Genevie Ann M.D.   On: 11/18/2015 20:09  11/18/2015  CLINICAL DATA:  75 year old male with onset of unsteady gait. Stroke-like symptoms. Initial encounter. EXAM: CT HEAD WITHOUT CONTRAST TECHNIQUE: Contiguous axial images were obtained from the base of the skull through the vertex without intravenous contrast. COMPARISON:  Neck CTA 11/02/2014. Head CT without contrast 10/22/2014. FINDINGS: Trace paranasal sinus mucosal thickening. Mastoids remain clear. No acute osseous abnormality identified. Stable orbit and scalp soft tissues. Fairly extensive calcified atherosclerosis at the skullbase re- demonstrated. Chronic cerebral volume loss is stable. No midline shift, mass effect, or evidence of intracranial mass lesion. No acute intracranial hemorrhage identified. No ventriculomegaly. No acute cortically based infarct identified. No suspicious intracranial vascular hyperdensity. Stable gray-white matter differentiation. ASPECTS score = 10. IMPRESSION: No acute cortically based infarct or acute intracranial abnormality identified. Electronically Signed: By: Genevie Ann M.D. On: 11/18/2015 19:38       PHYSICAL EXAM Pleasant elderly obese Caucasian male not in distress. . Afebrile. Head is nontraumatic. Neck is supple with soft right greater than left carotid bruits.    Cardiac exam no murmur or gallop. Lungs are  clear to auscultation. Distal pulses are well felt. Neurological Exam ;  Awake  Alert oriented x 3. Normal speech and language.eye movements full without nystagmus.fundi were not visualized. Vision acuity and fields appear normal. Hearing is normal. Palatal movements are normal. Face symmetric. Tongue midline. Normal strength, tone, reflexes and coordination Except mild left hip flexor and ankle dorsiflexor weakness 4/5.Marland Kitchen Normal sensation. Gait deferred. ASSESSMENT/PLAN Mr. Hensley Treat is a 75 y.o. male with history of atrial fibrillation not on anticoagulation, HTN, HLD, DM, CHF, and carotid stenosis who developed clumsy left arm and leg post cerebral arteriogram. He did not receive IV t-PA due to low NIHSS.   Bi-cerebral infarcts right greater than left status post carotid arteriogram with R ICA > 80% and L ICA occlusion  Resultant  L hemiparesis MRI    Scattered acute infarcts involving posterior right frontal, right parietal and left anterior frontal lobes.    Cerebral arteriogram right ICA > 80% stenosis, L ICA occluded. R ECA occluded.   2D Echo  pending  LDL 110  HgbA1c 7.3 in March  Lovenox 40 mg sq daily for VTE prophylaxis Diet heart healthy/carb modified Room service appropriate?: Yes; Fluid consistency:: Thin  aspirin 81 mg daily prior to admission, continued in aspirin 81 mg daily . May consider change based on  study results.  Ongoing aggressive stroke risk factor management  Therapy recommendations:  CIR. Consult placed  Disposition:  pending   Atrial Fibrillation, not on anticoagulation  Carotid disease  Right Carotid stenosis > 80%  R ECA occluded  L ICA occluded  Treatment per Dr. Bridgett Larsson  Hypertension  Hyperlipidemia  on Pravachol 40 continued in hospital  LDL 110 in March, goal < 70  Continue statin at discharge  Diabetes   HgbA1c 7.3 in March, goal < 7.0  Uncontrolled  Other Stroke Risk Factors  Former Cigarette smoker  Overweight,  Body mass index is 29.29 kg/(m^2).   Hx stroke/TIA  Family hx stroke (father)  CHF  Carotid occlusion  Other Active Problems  Lft carotid cocclusion  Hospital day #   I have personally examined this patient, reviewed notes, independently viewed imaging studies, participated in medical decision making and plan of care. I have made any additions or clarifications directly to the above note. Agree with transfer to rehabilitation and elective right carotid surgery in  2-4 weeks..The duration of this   visit was 15 minutes of face-to-face time with the patient. Greater than 50% of this time was spent in counseling, explanation of diagnosis, planning of further management, and coordination of care. Stroke team will sign off. Kindly call for questions. Follow-up as an outpatient in stroke clinic in 2 months , Discussion with patient and son at the bedside and answered questions.     [name], MD Medical Director [facility] Pager: [phone] 11/20/2015 4:03 PM    To contact Stroke Continuity provider, please refer to [email or web address] After hours, contact General Neurology

## 2015-11-20 NOTE — Clinical Social Work Note (Signed)
Clinical Social Work Assessment  Patient Details  Name: Evan Stanley MRN: [id number] Date of Birth: 21-Nov-1940  Date of referral:  11/20/15               Reason for consult:  Discharge Planning                Permission sought to share information with:  Chartered certified accountant granted to share information::  Yes, Verbal Permission Granted  Name::     Public affairs consultant::  SNFs  Relationship::  Wife  Contact Information:     Housing/Transportation Living arrangements for the past 2 months:  Single Family Home Source of Information:  Patient Patient Interpreter Needed:  None Criminal Activity/Legal Involvement Pertinent to Current Situation/Hospitalization:  No - Comment as needed Significant Relationships:  Spouse Lives with:  Spouse Do you feel safe going back to the place where you live?  Yes Need for family participation in patient care:  Yes (Comment)  Care giving concerns:  The patient is aggregable for short term rehab at discharge. Patient would like to rebuild his strength to return home.    Social Worker assessment / plan:  CSW met with patient at beside to complete assessment. Patient was resting comfortably in bed. Patient's wife was also at bedside.  CSW explained PT recommendation for CIR placement. CSW explained that an alternate plan of SNFs placement would possibly be needed.  CSW explained SNF search and placement process to the patient and patient's wife and answered their questions. The patient and patient's wife requested the CSW pursue alternate plan of SNFs placement. CSW will follow up with bed offers.  Employment status:  Retired Advertising copywriter PT Recommendations:  Inpatient Needles / Referral to community resources:  Uncertain  Patient/Family's Response to care:  The patient appears happy with the care he is receiving in hospital and is appreciative of CSW  assistance.  Patient/Family's Understanding of and Emotional Response to Diagnosis, Current Treatment, and Prognosis:  The patient has a good understanding of why he was admitted. He understands the care plan and what he will need post discharge.  Emotional Assessment Appearance:  Appears stated age Attitude/Demeanor/Rapport:    Affect (typically observed):   (Patient was welcoming of CSW and appropriate. ) Orientation:  Oriented to Self, Oriented to Place, Oriented to  Time, Oriented to Situation Alcohol / Substance use:    Psych involvement (Current and /or in the community):  No (Comment)  Discharge Needs  Concerns to be addressed:  Discharge Planning Concerns Readmission within the last 30 days:  No Current discharge risk:  Physical Impairment Barriers to Discharge:  Continued Medical Work up   [employer], LCSW [phone]  11/20/2015, 6:44 PM

## 2015-11-20 NOTE — PMR Pre-admission (Signed)
PMR Admission Coordinator Pre-Admission Assessment  Patient: Evan Stanley is an 75 y.o., male MRN: [id number] DOB: 1940/10/26 Height: 5' 5"  (165.1 cm) Weight: 78 kg (171 lb 15.3 oz)              Insurance Information HMO:     PPO: yes     PCP:      IPA:      80/20:      OTHER:  PRIMARY: [employer] [place]      Policy#: [id number]     Subscriber: pt's wife CM Name: Evan Glassman      Phone#: [phone] ext [phone]   Fax#: [phone] Pre-Cert#: [id number] from 05/05 to 12/04/15 with updates due 12/03/15    Employer:  Benefits:  Phone #: [phone]     Name: 11/20/15 Eff. Date: 07/21/15     Deduct: $500      Out of Pocket Max: $3500      Life Max: none CIR: 80%      SNF: 80% 60 days Outpatient: 80%     Co-Pay: 20% 60 visits combined Home Health: 80%      Co-Pay: 20% DME: 80%     Co-Pay: 20% Providers: in network   Medicaid Application Date:       Case Manager:  Disability Application Date:       Case Worker:   Emergency Facilities manager Information    Name Relation Home Work Mobile   Niven,[name] Spouse [phone]       Current Medical History  Patient Admitting Diagnosis: right frontal-parietal infarct  History of Present Illness: Raiquan Chandler is a 75 y.o. right handed male with history of hypertension, chronic renal insufficiency with baseline creatinine 1.40-1.48 diabetes mellitus, diastolic congestive heart failure, atrial fibrillation maintained on aspirin 81 mg as well as TIA in the past. Patient lives with spouse independent prior to admission.  Presented 11/18/2015 with known carotid artery stenosis diagnosed April 2016 by cardiology services right ICA greater than 80%, left ICA occluded. Underwent elective right carotid angiogram 11/18/2015 per Dr. Bridgett Larsson for workup and plan carotid surgery. Noted post angiogram with left sided weakness. Code stroke was called. CT of the head showed no acute cortically based infarct or acute intracranial abnormality. MRI of the brain showed  scattered foci of acute nonhemorrhagic infarct involving the posterior right frontal lobe, right parietal lobe . Neurology follow-up . Patient did not receive TPA.. Echocardiogram with ejection fraction of 55% without PFO.. Currently maintained on aspirin 81 mg daily. Subcutaneous Lovenox for DVT prophylaxis. Plan is to follow-up with vascular surgery 4 weeks outpatient to consider carotid surgery. Regular consistency diet.  NIH Total: 1  Past Medical History  Past Medical History  Diagnosis Date  . Diabetes mellitus without complication ([place])   . Hyperlipidemia   . Hypertension   . CHF (congestive heart failure) ([place])   . Anxiety   . Atrial fibrillation ([place])   . Carotid artery stenosis   . TIA (transient ischemic attack)   . Decreased vision     left eye    Family History  family history includes Heart disease in his mother; Stroke (age of onset: 29) in his father.  Prior Rehab/Hospitalizations:  Has the patient had major surgery during 100 days prior to admission? No  Current Medications   Current facility-administered medications:  .  0.9 %  sodium chloride infusion, 1 mL/kg/hr, Intravenous, Continuous, Conrad [place], MD, Last Rate: 79.8 mL/hr at 11/18/15 1347, 1 mL/kg/hr at 11/18/15 1347 .  acetaminophen (TYLENOL) tablet 650 mg, 650 mg, Oral, Q4H PRN, Conrad Winters, MD .  alum & mag hydroxide-simeth (MAALOX/MYLANTA) 200-200-20 MG/5ML suspension 15-30 mL, 15-30 mL, Oral, Q2H PRN, Rosetta Posner, MD .  amLODipine (NORVASC) tablet 10 mg, 10 mg, Oral, Daily, Rosetta Posner, MD, 10 mg at 11/22/15 0939 .  aspirin EC tablet 81 mg, 81 mg, Oral, Daily, Rosetta Posner, MD, 81 mg at 11/22/15 0939 .  enoxaparin (LOVENOX) injection 40 mg, 40 mg, Subcutaneous, Q24H, Rosetta Posner, MD, 40 mg at 11/21/15 2117 .  guaiFENesin-dextromethorphan (ROBITUSSIN DM) 100-10 MG/5ML syrup 15 mL, 15 mL, Oral, Q4H PRN, Rosetta Posner, MD .  hydrALAZINE (APRESOLINE) injection 10 mg, 10 mg, Intravenous, Q2H PRN, Conrad [place], MD .  hydrochlorothiazide (HYDRODIURIL) tablet 25 mg, 25 mg, Oral, Daily, Rosetta Posner, MD, 25 mg at 11/22/15 0939 .  insulin aspart (novoLOG) injection 0-9 Units, 0-9 Units, Subcutaneous, TID WC, Conrad Hamilton, MD, 1 Units at 11/22/15 [phone] .  labetalol (NORMODYNE,TRANDATE) injection 10 mg, 10 mg, Intravenous, Q10 min PRN, Rosetta Posner, MD .  lisinopril (PRINIVIL,ZESTRIL) tablet 40 mg, 40 mg, Oral, Daily, Rosetta Posner, MD, 40 mg at 11/22/15 0939 .  metFORMIN (GLUCOPHAGE) tablet 1,000 mg, 1,000 mg, Oral, BID WC, Conrad [place], MD, 1,000 mg at 11/22/15 0735 .  metoprolol (LOPRESSOR) injection 2-5 mg, 2-5 mg, Intravenous, Q2H PRN, Rosetta Posner, MD .  metoprolol succinate (TOPROL-XL) 24 hr tablet 100 mg, 100 mg, Oral, Daily, Rosetta Posner, MD, 100 mg at 11/22/15 [phone] .  ondansetron (ZOFRAN) injection 4 mg, 4 mg, Intravenous, Q6H PRN, Rosetta Posner, MD, 4 mg at 11/21/15 1809 .  oxyCODONE-acetaminophen (PERCOCET/ROXICET) 5-325 MG per tablet 1-2 tablet, 1-2 tablet, Oral, Q4H PRN, Rosetta Posner, MD, 1 tablet at 11/22/15 0735 .  pantoprazole (PROTONIX) EC tablet 40 mg, 40 mg, Oral, Daily, Rosetta Posner, MD, 40 mg at 11/22/15 [phone] .  phenol (CHLORASEPTIC) mouth spray 1 spray, 1 spray, Mouth/Throat, PRN, Arvilla Meres Early, MD .  potassium chloride SA (K-DUR,KLOR-CON) CR tablet 20-40 mEq, 20-40 mEq, Oral, Once, Rosetta Posner, MD, 20 mEq at 11/18/15 2045 .  pravastatin (PRAVACHOL) tablet 40 mg, 40 mg, Oral, Daily, Rosetta Posner, MD, 40 mg at 11/22/15 9211  Patients Current Diet: Diet heart healthy/carb modified Room service appropriate?: Yes; Fluid consistency:: Thin  Precautions / Restrictions Precautions Precautions: Fall Restrictions Weight Bearing Restrictions: No   Has the patient had 2 or more falls or a fall with injury in the past year?No  Prior Activity Level Community (5-7x/wk): I without AD pta. retired. golfs 3 times per week. Eats out daily. Very active and I  Development worker, international aid /  [place] Devices/Equipment: CBG Meter Home Equipment: None  Prior Device Use: Indicate devices/aids used by the patient prior to current illness, exacerbation or injury? None of the above  Prior Functional Level Prior Function Level of Independence: Independent  Self Care: Did the patient need help bathing, dressing, using the toilet or eating?  Independent  Indoor Mobility: Did the patient need assistance with walking from room to room (with or without device)? Independent  Stairs: Did the patient need assistance with internal or external stairs (with or without device)? Independent  Functional Cognition: Did the patient need help planning regular tasks such as shopping or remembering to take medications? Independent  Current Functional Level Cognition  Arousal/Alertness: Awake/alert Overall Cognitive Status: Impaired/Different from baseline Orientation Level: Oriented  X4 Safety/Judgement: Decreased awareness of safety, Decreased awareness of deficits General Comments: pt trying to get to EOB without assistance to use urinal Attention: Selective Selective Attention: Appears intact Memory: Impaired Memory Impairment: Storage deficit, Retrieval deficit, Decreased short term memory, Prospective memory Decreased Short Term Memory: Verbal basic Awareness: Impaired Awareness Impairment: Emergent impairment Safety/Judgment: Impaired    Extremity Assessment (includes Sensation/Coordination)  Upper Extremity Assessment: LUE deficits/detail LUE Deficits / Details: 4/5 shoulder, 4+/5 elbow to hand  Lower Extremity Assessment: Defer to PT evaluation LLE Deficits / Details: AROM WFL, strength hip flexion 4/5, knee extension 3+/5, ankle DF 4/5, coordination deficits with toe tapping    ADLs  Overall ADL's : Needs assistance/impaired Eating/Feeding: Independent, Sitting Eating/Feeding Details (indicate cue type and reason): pt demonstrating ability to open containers on  meal tray Grooming: Wash/dry hands, Sitting, Set up Upper Body Bathing: Minimal assitance, Sitting Lower Body Bathing: Maximal assistance, Sit to/from stand Upper Body Dressing : Minimal assistance, Sitting Lower Body Dressing: Maximal assistance, Sit to/from stand Toilet Transfer: Moderate assistance, Stand-pivot, BSC, RW Toileting- Clothing Manipulation and Hygiene: Maximal assistance, Sit to/from stand General ADL Comments: Pt not able to release walker in standing to perform grooming or to stand to pull up pants, poor sitting balance and L LE weakness interfering with ability to don sock.    Mobility  Overal bed mobility: Needs Assistance Bed Mobility: Supine to Sit Supine to sit: Mod assist General bed mobility comments: assist to raise trunk and pivot hips with pad to EOB    Transfers  Overall transfer level: Needs assistance Equipment used: Rolling walker (2 wheeled) Transfers: Sit to/from Stand Sit to Stand: Mod assist Stand pivot transfers: Mod assist General transfer comment: VCs for hand placement and assist to bring hips up. Posterior and lt bias.    Ambulation / Gait / Stairs / Wheelchair Mobility  Ambulation/Gait Ambulation/Gait assistance: Mod assist Ambulation Distance (Feet): 50 Feet Assistive device: Rolling walker (2 wheeled) Gait Pattern/deviations: Step-through pattern, Decreased stride length, Trendelenburg, Decreased weight shift to right General Gait Details: Assist to support lt hip and knee to prevent buckling. Difficulty with placement of lt foot. Gait velocity: decr Gait velocity interpretation: Below normal speed for age/gender    Posture / Balance Dynamic Sitting Balance Sitting balance - Comments: Pt using UE's for support. Supervision for safety. Balance Overall balance assessment: Needs assistance Sitting-balance support: Feet supported, Bilateral upper extremity supported Sitting balance-Leahy Scale: Poor Sitting balance - Comments: Pt using  UE's for support. Supervision for safety. Postural control: Left lateral lean, Posterior lean Standing balance support: Bilateral upper extremity supported Standing balance-Leahy Scale: Poor Standing balance comment: Walker and mod A    Special needs/care consideration Skin intact                           Bowel mgmt: continent Bladder mgmt: continent Diabetic mgmt Hgb A1c 7.3   Previous Home Environment Living Arrangements: Spouse/significant other  Lives With: Spouse Available Help at Discharge:  (wife works day shift) Type of Home: House Home Layout: Laundry or work area in basement, One level Home Access: Stairs to enter Entrance Stairs-Rails: None Technical brewer of Steps: 1 Bathroom Shower/Tub: Gaffer, Charity fundraiser: Standard Bathroom Accessibility: Yes How Accessible: Accessible via walker [place]: No  Discharge Living Setting Plans for Discharge Living Setting: Patient's home, Lives with (comment) (spouse) Type of Home at Discharge: House Discharge Home Layout: One level, Laundry or work area in  basement Discharge Home Access: Stairs to enter Entrance Stairs-Rails: None Entrance Stairs-Number of Steps: 1 Discharge Bathroom Shower/Tub: Walk-in shower, Door Discharge Bathroom Toilet: Standard Discharge Bathroom Accessibility: Yes How Accessible: Accessible via walker Does the patient have any problems obtaining your medications?: No  Social/Family/Support Systems Patient Roles: Spouse, Parent Contact Information: Kyshawn Teal Anticipated Caregiver: wife works days. May be able to take off but only very short time Anticipated Caregiver's Contact Information: see above Ability/Limitations of Caregiver: wife works 7-3 daily weekdays Caregiver Availability: Evenings only Discharge Plan Discussed with Primary Caregiver: Yes Is Caregiver In Agreement with Plan?: Yes Does Caregiver/Family have Issues with Lodging/Transportation while Pt is  in Rehab?: No  Goals/Additional Needs Patient/Family Goal for Rehab: Mod I with PT and OT Expected length of stay: ELOS 7-9 days Special Service Needs: CEA planned in future after rehab stay.TBD Pt/Family Agrees to Admission and willing to participate: Yes Program Orientation Provided & Reviewed with Pt/Caregiver Including Roles  & Responsibilities: Yes  Decrease burden of Care through IP rehab admission: n/a  Possible need for SNF placement upon discharge: not antiociapted  Patient Condition: This patient's medical and functional status has changed since the consult dated: 11/19/2015 in which the Rehabilitation Physician determined and documented that the patient's condition is appropriate for intensive rehabilitative care in an inpatient rehabilitation facility. See "History of Present Illness" (above) for medical update. Functional changes are:  Min to mod  assist. Patient's medical and functional status update has been discussed with the Rehabilitation physician and patient remains appropriate for inpatient rehabilitation. Will admit to inpatient rehab today.  Preadmission Screen Completed By:  Retta Diones, 11/22/2015 11:13 AM ______________________________________________________________________   Discussed status with Dr. Naaman Plummer on 11/22/15 at 1112 and received telephone approval for admission today.  Admission Coordinator:  Retta Diones, time 1112/Date05/05/17

## 2015-11-20 NOTE — Progress Notes (Signed)
I met with pt and his wife at bedside to discuss a possible inpt rehab admission pending insurance approval and bed availability. I await OT eval today to begin that process. They both prefer an inpt rehab admission rather than SNF or other inpt hospital rehab. I will begin authorization today. [phone]

## 2015-11-20 NOTE — Progress Notes (Signed)
Physical Therapy Treatment Patient Details Name: Evan Stanley MRN: [id number] DOB: [birth year] Today's Date: 11/20/2015    History of Present Illness Patient is a 75 y/o male admitted POD #1 s/p R carotid angiogram complicated by TIA/CVA, LICA occlusion, high grade R ICA stenosis; MRI of the brain showed scattered foci of acute nonhemorrhagic infarct involving the posterior right frontal lobe, right parietal lobe     PT Comments    Session focused on balance, coordination and timing with standing, and functional mobility acts including ambulation; Improved L stance stability with multimodal cues to stand tall on LLE; continues with heavy reliance on UE support on RW;   Continue to recommend comprehensive inpatient rehab (CIR) for post-acute therapy needs.   Follow Up Recommendations  CIR     Equipment Recommendations  Rolling walker with 5" wheels    Recommendations for Other Services Rehab consult     Precautions / Restrictions Precautions Precautions: Fall Restrictions Weight Bearing Restrictions: No    Mobility  Bed Mobility               General bed mobility comments: up in recliner  Transfers Overall transfer level: Needs assistance Equipment used: Rolling walker (2 wheeled) Transfers: Sit to/from Stand Sit to Stand: Mod assist         General transfer comment: mod power up assist and posterior LOB in standing, cues for hand placement and poor placement for sit to stand with R foot to far forward; stand to sit max cues for safety and mod A; worked on timing of liftoff with anterior weight shift fisrt, then pushoff  Ambulation/Gait Ambulation/Gait assistance: Mod assist Ambulation Distance (Feet): 12 Feet Assistive device: Rolling walker (2 wheeled) Gait Pattern/deviations: Step-through pattern;Decreased stride length;Trendelenburg     General Gait Details: Noting continued L hip and knee buckle, somewhat improved with verbal and tactile cueing to stay  tall in hip extension throughout L stance   Stairs            Wheelchair Mobility    Modified Rankin (Stroke Patients Only) Modified Rankin (Stroke Patients Only) Pre-Morbid Rankin Score: No symptoms Modified Rankin: Moderately severe disability     Balance Overall balance assessment: Needs assistance   Sitting balance-Leahy Scale: Fair       Standing balance-Leahy Scale: Poor Standing balance comment: Worked on weight shift forward in standing including heel raises; brief periods of not needing assist in standing, but continued close guard                    Cognition Arousal/Alertness: Awake/alert Behavior During Therapy: WFL for tasks assessed/performed Overall Cognitive Status: Within Functional Limits for tasks assessed                      Exercises Other Exercises Other Exercises: Heel-to-shin coordination exercised R and LLEs    General Comments General comments (skin integrity, edema, etc.): Pt and wife with many questions re: balance and walking after CVA; discussed how coordination and balance are affected psot CVA, and what to anticipate during rehab course      Pertinent Vitals/Pain Pain Assessment: 0-10 Pain Score: 7  Pain Location: bil knees, R greater than L; attributes pain to "being old" Pain Descriptors / Indicators: Aching Pain Intervention(s): Monitored during session;Patient requesting pain meds-RN notified    Home Living                      Prior Function  PT Goals (current goals can now be found in the care plan section) Acute Rehab PT Goals Patient Stated Goal: To walk better PT Goal Formulation: With patient/family Time For Goal Achievement: 11/26/15 Potential to Achieve Goals: Good Progress towards PT goals: Progressing toward goals    Frequency  Min 4X/week    PT Plan Current plan remains appropriate    Co-evaluation             End of Session Equipment Utilized During  Treatment: Gait belt Activity Tolerance: Patient tolerated treatment well;Patient limited by pain Patient left: in chair;with call bell/phone within reach;with chair alarm set;with family/visitor present     Time: 6815-9470 PT Time Calculation (min) (ACUTE ONLY): 35 min  Charges:  $Gait Training: 8-22 mins $Neuromuscular Re-education: 8-22 mins                    G Codes:      Evan Stanley 11/20/2015, 11:30 AM  Evan Stanley, [place] Pager [phone] Office [phone]

## 2015-11-20 NOTE — [place] FL2 (Signed)
[place] MEDICAID FL2 LEVEL OF CARE SCREENING TOOL     IDENTIFICATION  Patient Name: Evan Stanley Birthdate: [birth year] Sex: male Admission Date (Current Location): 11/18/2015  [facility] and [place] Number:  Herbalist and Address:  [facility] [place], [place] [address] [place],  [address]      Provider Number: [id number]  Attending Physician Name and Address:  Conrad Rhodhiss, MD  Relative Name and Phone Number:       Current Level of Care: Hospital Recommended Level of Care: [place] Prior Approval Number:    Date Approved/Denied:   PASRR Number: [id number]  Discharge Plan: SNF    Current Diagnoses: Patient Active Problem List   Diagnosis Date Noted  . Acute CVA (cerebrovascular accident) ([place]) 11/19/2015  . Carotid artery disease ([place]) 11/18/2015  . Carotid artery occlusion without infarction 10/26/2014  . Carotid stenosis 10/26/2014  . Status post aortic valve replacement with bioprosthetic valve 07/05/2013  . Hyperlipidemia 07/05/2013  . Essential hypertension 07/05/2013  . CAD (coronary artery disease)   . DM (diabetes mellitus) ([place])     Orientation RESPIRATION BLADDER Height & Weight     Self, Time, Situation, Place  Normal Continent Weight: 176 lb (79.833 kg) Height:  5' 5"  (165.1 cm)  BEHAVIORAL SYMPTOMS/MOOD NEUROLOGICAL BOWEL NUTRITION STATUS   (None)  (None) Continent Diet (heart healthy/ carb modified)  AMBULATORY STATUS COMMUNICATION OF NEEDS Skin   Extensive Assist Verbally Normal                       Personal Care Assistance Level of Assistance  Bathing, Feeding, Dressing Bathing Assistance: Limited assistance Feeding assistance: Limited assistance Dressing Assistance: Limited assistance     Functional Limitations Info  Sight, Hearing, Speech Sight Info: Adequate Hearing Info: Adequate Speech Info: Adequate    SPECIAL CARE FACTORS FREQUENCY  PT (By licensed PT), OT (By licensed OT),  Speech therapy     PT Frequency: 5/ week OT Frequency: 5/ week     Speech Therapy Frequency: 1/ week      Contractures Contractures Info: Not present    Additional Factors Info  Code Status, Allergies Code Status Info: FULL Allergies Info: NKDA           Current Medications (11/20/2015):  This is the current hospital active medication list Current Facility-Administered Medications  Medication Dose Route Frequency Provider Last Rate Last Dose  .  stroke: mapping our early stages of recovery book   Does not apply Once Greta Doom, MD      . 0.9 %  sodium chloride infusion  1 mL/kg/hr Intravenous Continuous Conrad [place], MD 79.8 mL/hr at 11/18/15 1347 1 mL/kg/hr at 11/18/15 1347  . acetaminophen (TYLENOL) tablet 650 mg  650 mg Oral Q4H PRN Conrad Anderson, MD      . alum & mag hydroxide-simeth (MAALOX/MYLANTA) 200-200-20 MG/5ML suspension 15-30 mL  15-30 mL Oral Q2H PRN Rosetta Posner, MD      . amLODipine (NORVASC) tablet 10 mg  10 mg Oral Daily Rosetta Posner, MD   10 mg at 11/20/15 1007  . aspirin EC tablet 81 mg  81 mg Oral Daily Rosetta Posner, MD   81 mg at 11/20/15 1006  . enoxaparin (LOVENOX) injection 40 mg  40 mg Subcutaneous Q24H Rosetta Posner, MD   40 mg at 11/19/15 2150  . guaiFENesin-dextromethorphan (ROBITUSSIN DM) 100-10 MG/5ML syrup 15 mL  15 mL Oral  Q4H PRN Rosetta Posner, MD      . hydrALAZINE (APRESOLINE) injection 10 mg  10 mg Intravenous Q2H PRN Conrad [place], MD      . hydrochlorothiazide (HYDRODIURIL) tablet 25 mg  25 mg Oral Daily Rosetta Posner, MD   25 mg at 11/20/15 1007  . insulin aspart (novoLOG) injection 0-9 Units  0-9 Units Subcutaneous TID WC Conrad Shannon, MD   2 Units at 11/20/15 1753  . labetalol (NORMODYNE,TRANDATE) injection 10 mg  10 mg Intravenous Q10 min PRN Rosetta Posner, MD      . lisinopril (PRINIVIL,ZESTRIL) tablet 40 mg  40 mg Oral Daily Rosetta Posner, MD   40 mg at 11/20/15 1006  . [START ON 11/21/2015] metFORMIN (GLUCOPHAGE) tablet 1,000 mg   1,000 mg Oral BID WC Conrad [place], MD      . metoprolol (LOPRESSOR) injection 2-5 mg  2-5 mg Intravenous Q2H PRN Rosetta Posner, MD      . metoprolol succinate (TOPROL-XL) 24 hr tablet 100 mg  100 mg Oral Daily Rosetta Posner, MD   100 mg at 11/20/15 1007  . ondansetron (ZOFRAN) injection 4 mg  4 mg Intravenous Q6H PRN Rosetta Posner, MD      . oxyCODONE-acetaminophen (PERCOCET/ROXICET) 5-325 MG per tablet 1-2 tablet  1-2 tablet Oral Q4H PRN Rosetta Posner, MD   1 tablet at 11/20/15 1008  . pantoprazole (PROTONIX) EC tablet 40 mg  40 mg Oral Daily Rosetta Posner, MD   40 mg at 11/20/15 1007  . phenol (CHLORASEPTIC) mouth spray 1 spray  1 spray Mouth/Throat PRN Rosetta Posner, MD      . potassium chloride SA (K-DUR,KLOR-CON) CR tablet 20-40 mEq  20-40 mEq Oral Once Rosetta Posner, MD   20 mEq at 11/18/15 2045  . pravastatin (PRAVACHOL) tablet 40 mg  40 mg Oral Daily Rosetta Posner, MD   40 mg at 11/20/15 1007     Discharge Medications: Please see discharge summary for a list of discharge medications.  Relevant Imaging Results:  Relevant Lab Results:   Additional Information [facility]:[id number]  Samule Dry, LCSW

## 2015-11-21 DIAGNOSIS — I779 Disorder of arteries and arterioles, unspecified: Secondary | ICD-10-CM

## 2015-11-21 LAB — GLUCOSE, CAPILLARY
GLUCOSE-CAPILLARY: 154 mg/dL — AB (ref 65–99)
GLUCOSE-CAPILLARY: 154 mg/dL — AB (ref 65–99)
GLUCOSE-CAPILLARY: 186 mg/dL — AB (ref 65–99)

## 2015-11-21 NOTE — Progress Notes (Signed)
I await BCBS approval to possibly admit pt today to CIR. [phone]

## 2015-11-21 NOTE — Progress Notes (Signed)
Rehab admissions - I have not heard back from [facility] insurance case Freight forwarder.  Will follow up tomorrow after I have a response from insurance.  Call me for questions.  #[phone]

## 2015-11-21 NOTE — Progress Notes (Addendum)
  [facility] Progress Note  Subjective  - POD #3  No complaints today.   Objective Filed Vitals:   11/20/15 2115 11/21/15 0646  BP: 124/61 128/61  Pulse: 58 64  Temp: 98.1 F (36.7 C) 98.3 F (36.8 C)  Resp: 18 18    Intake/Output Summary (Last 24 hours) at 11/21/15 0800 Last data filed at 11/20/15 1752  Gross per 24 hour  Intake   1200 ml  Output      0 ml  Net   1200 ml   5/5 strength right upper and lower extremities. 5/5 left upper extremity. 3/5 left lower extremity.  Smile symmetric. Tongue midline.   Assessment/Planning: 75 y.o. male is s/p: right carotid and cerebral angiogram complicated by TIA/CVA 3 Days Post-Op   Doing ok this am.  CIR admission pending insurance approval. To CIR today if approved.  Will f/u with Dr. Bridgett Larsson in 4 weeks to reevaluate for right carotid endarterectomy  Joelene Millin A Trinh 11/21/2015 8:00 AM --  Laboratory CBC    Component Value Date/Time   WBC 9.1 11/18/2015 2041   WBC 8.7 04/16/2014 1002   HGB 14.4 11/18/2015 2041   HGB 15.0 04/16/2014 1002   HCT 42.8 11/18/2015 2041   HCT 45.2 04/16/2014 1002   PLT 211 11/18/2015 2041    BMET    Component Value Date/Time   NA 143 11/18/2015 2041   NA 141 09/20/2015 0807   K 4.3 11/18/2015 2041   CL 106 11/18/2015 2041   CO2 26 11/18/2015 2041   GLUCOSE 157* 11/18/2015 2041   GLUCOSE 195* 09/20/2015 0807   BUN 16 11/18/2015 2041   BUN 12 09/20/2015 0807   CREATININE 1.43* 11/18/2015 2041   CALCIUM 9.9 11/18/2015 2041   GFRNONAA 47* 11/18/2015 2041   GFRAA 55* 11/18/2015 2041    COAG Lab Results  Component Value Date   INR 1.02 11/18/2015   INR 0.99 10/22/2014   INR 1.5 11/01/2008   No results found for: PTT  Antibiotics Anti-infectives    None       Virgina Jock, PA-C [facility] Office: [phone] Pager: [phone] 11/21/2015 8:00 AM   Addendum  I agree with the physician assistant's findings.  CIR once bed  available.  Follow up for re-evaluation in office in 4 weeks.  [name], MD [facility] [place] Office: [phone] Pager: [phone]  11/21/2015, 8:36 AM

## 2015-11-21 NOTE — Progress Notes (Signed)
Physical Therapy Treatment Patient Details Name: Evan Stanley MRN: [id number] DOB: 1941/01/29 Today's Date: 11/21/2015    History of Present Illness Patient is a 75 y/o male admitted POD #1 s/p R carotid angiogram complicated by TIA/CVA, LICA occlusion, high grade R ICA stenosis; MRI of the brain showed scattered foci of acute nonhemorrhagic infarct involving the posterior right frontal lobe, right parietal lobe     PT Comments    Pt making good progress. Continues to be appropriate for CIR.  Follow Up Recommendations  CIR     Equipment Recommendations  Rolling walker with 5" wheels    Recommendations for Other Services       Precautions / Restrictions Precautions Precautions: Fall Restrictions Weight Bearing Restrictions: No    Mobility  Bed Mobility                  Transfers Overall transfer level: Needs assistance Equipment used: Rolling walker (2 wheeled) Transfers: Sit to/from Stand Sit to Stand: Mod assist         General transfer comment: VCs for hand placement and assist to bring hips up. Posterior and lt bias.  Ambulation/Gait Ambulation/Gait assistance: Mod assist Ambulation Distance (Feet): 50 Feet Assistive device: Rolling walker (2 wheeled) Gait Pattern/deviations: Step-through pattern;Decreased stride length;Trendelenburg;Decreased weight shift to right Gait velocity: decr Gait velocity interpretation: Below normal speed for age/gender General Gait Details: Assist to support lt hip and knee to prevent buckling. Difficulty with placement of lt foot.   Stairs            Wheelchair Mobility    Modified Rankin (Stroke Patients Only)       Balance Overall balance assessment: Needs assistance Sitting-balance support: Feet supported;Bilateral upper extremity supported Sitting balance-Leahy Scale: Poor Sitting balance - Comments: Pt using UE's for support. Supervision for safety.   Standing balance support: Bilateral upper extremity  supported Standing balance-Leahy Scale: Poor Standing balance comment: Walker and mod A                    Cognition Arousal/Alertness: Awake/alert Behavior During Therapy: WFL for tasks assessed/performed Overall Cognitive Status: Impaired/Different from baseline Area of Impairment: Safety/judgement         Safety/Judgement: Decreased awareness of safety;Decreased awareness of deficits     General Comments: pt trying to get to EOB without assistance to use urinal    Exercises      General Comments        Pertinent Vitals/Pain Pain Assessment: No/denies pain    Home Living                      Prior Function            PT Goals (current goals can now be found in the care plan section) Acute Rehab PT Goals Patient Stated Goal: To walk better Progress towards PT goals: Progressing toward goals    Frequency  Min 4X/week    PT Plan Current plan remains appropriate    Co-evaluation             End of Session Equipment Utilized During Treatment: Gait belt Activity Tolerance: Patient tolerated treatment well Patient left: in chair;with call bell/phone within reach;with chair alarm set     Time: 9741-6384 PT Time Calculation (min) (ACUTE ONLY): 14 min  Charges:  $Gait Training: 8-22 mins                    G Codes:  Chiamaka Latka 11/21/2015, 3:25 PM [facility] PT [phone]

## 2015-11-21 NOTE — H&P (Addendum)
Physical Medicine and Rehabilitation Admission H&P    Chief complaint: Weakness  HPI: Evan Stanley is a 75 y.o. right handed male with history of hypertension, chronic renal insufficiency with baseline creatinine 1.40-1.48 diabetes mellitus, diastolic congestive heart failure, atrial fibrillation maintained on aspirin 81 mg as well as TIA in the past. Patient lives with spouse independent prior to admission. One level home. Wife works during the day. Presented 11/18/2015 with known carotid artery stenosis diagnosed April 2016 by cardiology services right ICA greater than 80%, left ICA occluded. Underwent elective right carotid angiogram 11/18/2015 per Dr. Bridgett Larsson for workup and plan carotid surgery. Noted post angiogram with left sided weakness. Code stroke was called. CT of the head showed no acute cortically based infarct or acute intracranial abnormality. MRI of the brain showed scattered foci of acute nonhemorrhagic infarct involving the posterior right frontal lobe, right parietal lobe . Neurology has been following-up with the patient. Patient did not receive TPA.. Echocardiogram with ejection fraction of 55% without PFO.. Currently maintained on aspirin 81 mg daily. Subcutaneous Lovenox for DVT prophylaxis. Plan is to follow-up with vascular surgery 4 weeks outpatient to consider carotid surgery. Regular consistency diet. Physical and occupational therapy evaluations completed  with recommendations of physical medicine rehabilitation consult. Patient was admitted for a comprehensive rehabilitation program  ROS Constitutional: Negative for fever and chills.  HENT: Negative for hearing loss.  Eyes: Positive for blurred vision. Negative for double vision.  Respiratory: Negative for cough and shortness of breath.  Cardiovascular: Positive for palpitations and leg swelling. Negative for chest pain.  Gastrointestinal: Positive for constipation. Negative for nausea and vomiting.  Genitourinary:  Positive for dysuria. Negative for hematuria.  Musculoskeletal: Positive for myalgias and joint pain. Left sided neck pain. Neurological: Positive for dizziness and weakness. Negative for seizures, loss of consciousness and headaches.  Psychiatric/Behavioral:   Anxiety  All other systems reviewed and are negative   Past Medical History  Diagnosis Date  . Diabetes mellitus without complication ([place])   . Hyperlipidemia   . Hypertension   . CHF (congestive heart failure) ([place])   . Anxiety   . Atrial fibrillation ([place])   . Carotid artery stenosis   . TIA (transient ischemic attack)   . Decreased vision     left eye   Past Surgical History  Procedure Laterality Date  . Cardiac valve replacement    . Aortic valve replacement    . Back surgery    . Laser surgery, left eye  Left 10-29-2015     retinal surgery/ Deloria Lair MD   . Peripheral vascular catheterization Right 11/18/2015    Procedure: Carotid Angiography;  Surgeon: Conrad Warsaw, MD;  Location: [facility];  Service: Cardiovascular;  Laterality: Right;  . Peripheral vascular catheterization N/A 11/18/2015    Procedure: Aortic Arch Angiography;  Surgeon: Conrad Merrydale, MD;  Location: [facility];  Service: Cardiovascular;  Laterality: N/A;   Family History  Problem Relation Age of Onset  . Heart disease Mother   . Stroke Father 25   Social History:  reports that he quit smoking about 31 years ago. He has never used smokeless tobacco. He reports that he does not drink alcohol or use illicit drugs. Allergies: No Known Allergies Medications Prior to Admission  Medication Sig Dispense Refill  . amLODipine (NORVASC) 10 MG tablet Take 1 tablet (10 mg total) by mouth daily. 90 tablet 1  . aspirin EC 81 MG tablet Take 1 tablet (81  mg total) by mouth daily. 90 tablet 3  . hydrochlorothiazide (HYDRODIURIL) 25 MG tablet Take 1 tablet (25 mg total) by mouth daily. 90 tablet 1  . lisinopril (PRINIVIL,ZESTRIL) 40 MG  tablet TAKE 1 TABLET DAILY 90 tablet 1  . metFORMIN (GLUCOPHAGE) 1000 MG tablet Take 1 tablet (1,000 mg total) by mouth 2 (two) times daily with a meal. 180 tablet 1  . metoprolol succinate (TOPROL-XL) 100 MG 24 hr tablet Take 1 tablet (100 mg total) by mouth daily. Take with or immediately following a meal. 90 tablet 1  . pravastatin (PRAVACHOL) 40 MG tablet Take 40 mg by mouth daily.    Marland Kitchen terazosin (HYTRIN) 1 MG capsule Take 1 capsule (1 mg total) by mouth at bedtime. (Patient not taking: Reported on 09/20/2015) 90 capsule 1    Home: [place] expects to be discharged to:: Private residence Living Arrangements: Spouse/significant other Available Help at Discharge:  (wife works day shift) Type of Home: House Home Access: Stairs to enter Technical brewer of Steps: 1 Entrance Stairs-Rails: None Home Layout: Laundry or work area in basement, One level [employer] Shower/Tub: Gaffer, Charity fundraiser: Associate Professor Accessibility: Yes Home Equipment: None  Lives With: Spouse   Functional History: Prior Function Level of Independence: Independent  Functional Status:  Mobility: Bed Mobility Overal bed mobility: Needs Assistance Bed Mobility: Supine to Sit Supine to sit: Mod assist General bed mobility comments: assist to raise trunk and pivot hips with pad to EOB Transfers Overall transfer level: Needs assistance Equipment used: Rolling walker (2 wheeled) Transfers: Sit to/from Stand, [employer] Transfers Sit to Stand: Mod assist Stand pivot transfers: Mod assist General transfer comment: Cues for hand placement and to rise keeping weight shifted anterior, pt with posterior and L bias Ambulation/Gait Ambulation/Gait assistance: Mod assist Ambulation Distance (Feet): 12 Feet Assistive device: Rolling walker (2 wheeled) Gait Pattern/deviations: Step-through pattern, Decreased stride length, Trendelenburg General Gait Details: Noting continued L  hip and knee buckle, somewhat improved with verbal and tactile cueing to stay tall in hip extension throughout L stance    ADL: ADL Overall ADL's : Needs assistance/impaired Eating/Feeding: Independent, Sitting Eating/Feeding Details (indicate cue type and reason): pt demonstrating ability to open containers on meal tray Grooming: Wash/dry hands, Sitting, Set up Upper Body Bathing: Minimal assitance, Sitting Lower Body Bathing: Maximal assistance, Sit to/from stand Upper Body Dressing : Minimal assistance, Sitting Lower Body Dressing: Maximal assistance, Sit to/from stand Toilet Transfer: Moderate assistance, Stand-pivot, BSC, RW Toileting- Clothing Manipulation and Hygiene: Maximal assistance, Sit to/from stand General ADL Comments: Pt not able to release walker in standing to perform grooming or to stand to pull up pants, poor sitting balance and L LE weakness interfering with ability to don sock.  Cognition: Cognition Overall Cognitive Status: Impaired/Different from baseline Arousal/Alertness: Awake/alert Orientation Level: Oriented X4 Attention: Selective Selective Attention: Appears intact Memory: Impaired Memory Impairment: Storage deficit, Retrieval deficit, Decreased short term memory, Prospective memory Decreased Short Term Memory: Verbal basic Awareness: Impaired Awareness Impairment: Emergent impairment Safety/Judgment: Impaired Cognition Arousal/Alertness: Awake/alert Behavior During Therapy: WFL for tasks assessed/performed Overall Cognitive Status: Impaired/Different from baseline Area of Impairment: Safety/judgement Safety/Judgement: Decreased awareness of safety, Decreased awareness of deficits General Comments: pt with fall attempting OOB by himself  Physical Exam: Blood pressure 128/61, pulse 64, temperature 98.3 F (36.8 C), temperature source Oral, resp. rate 18, height 5' 5"  (1.651 m), weight 76.8 kg (169 lb 5 oz), SpO2 99 %. Physical  Exam Constitutional: He is oriented to  person, place, and time. He appears well-developed.  HENT:  Head: Normocephalic.  Eyes: EOM are normal.  Neck: Normal range of motion. Neck supple. No thyromegaly present.  Cardiovascular:  Cardiac rate control  Respiratory: Effort normal and breath sounds normal. No respiratory distress.  GI: Soft. Bowel sounds are normal. He exhibits no distension.  Musculoskeletal: mild pain left neck with turning to left or bending left.  Neurological: He is alert and oriented to person, place, and time.  Good awareness and insight. Incomplete LLQ field cut appears to persist (inconsistent). Seems to be fairly aware of left side. Mild deficits in short-term and working memory. Follows commands. Moves all extremities. Decreased fine motor skills on left. Mild left limb ataxia. MMT: 5/5 RUE and 4+/5 LUE. RLE: 4+ to 5/5. LLE: 4 to 4/5. Senses pain on left side.  Skin: Skin is warm and dry Psych: mood is pleasant and cooperative.    Results for orders placed or performed during the hospital encounter of 11/18/15 (from the past 48 hour(s))  Glucose, capillary     Status: Abnormal   Collection Time: 11/19/15 12:33 PM  Result Value Ref Range   Glucose-Capillary 195 (H) 65 - 99 mg/dL  Glucose, capillary     Status: Abnormal   Collection Time: 11/19/15  4:23 PM  Result Value Ref Range   Glucose-Capillary 186 (H) 65 - 99 mg/dL  Glucose, capillary     Status: Abnormal   Collection Time: 11/19/15 10:34 PM  Result Value Ref Range   Glucose-Capillary 125 (H) 65 - 99 mg/dL   Comment 1 Notify RN   Glucose, capillary     Status: Abnormal   Collection Time: 11/20/15  6:19 AM  Result Value Ref Range   Glucose-Capillary 154 (H) 65 - 99 mg/dL  Glucose, capillary     Status: Abnormal   Collection Time: 11/20/15 11:30 AM  Result Value Ref Range   Glucose-Capillary 154 (H) 65 - 99 mg/dL   Comment 1 Notify RN   Glucose, capillary     Status: Abnormal   Collection Time:  11/20/15  5:47 PM  Result Value Ref Range   Glucose-Capillary 197 (H) 65 - 99 mg/dL  Glucose, capillary     Status: Abnormal   Collection Time: 11/20/15  9:12 PM  Result Value Ref Range   Glucose-Capillary 150 (H) 65 - 99 mg/dL   Comment 1 Notify RN    Comment 2 Document in Chart   Glucose, capillary     Status: Abnormal   Collection Time: 11/21/15  6:40 AM  Result Value Ref Range   Glucose-Capillary 154 (H) 65 - 99 mg/dL   Mr Brain Wo Contrast  11/19/2015  CLINICAL DATA:  Left upper and lower extremity weakness. New onset prior to discharge. EXAM: MRI HEAD WITHOUT CONTRAST TECHNIQUE: Multiplanar, multiecho pulse sequences of the brain and surrounding structures were obtained without intravenous contrast. COMPARISON:  CT of the head without contrast 11/18/2015 FINDINGS: Multiple scattered foci of restricted diffusion are evident within the precentral gyrus. There is at least 1 focus in the postcentral gyrus. There is also a focus of restricted diffusion measuring just over 2 cm adjacent to the right lateral ventricle. A single punctate focus is present within the left middle frontal gyrus on image 25 series 4. Moderate generalized atrophy is present. The ventricles are proportionate to the degree of atrophy. T2 changes are associated with the areas of restricted diffusion. There is a remote lacunar infarct in the left coronal radiata. Scattered subcortical  T2 changes are present bilaterally. The internal auditory canals are within normal limits bilaterally. The brainstem and cerebellum are unremarkable. The right vertebral artery is occluded. The left internal carotid artery is occluded. Flow is present in the left vertebral artery in the basilar artery. Flow is present in the right internal carotid artery at the skullbase and the left internal carotid artery above the ophthalmic segment. The left lens has been replaced. The globes and orbits are otherwise intact. Mild mucosal thickening is present  throughout the ethmoid air cells and within the frontal sinuses bilaterally. The remaining paranasal sinuses are clear. The mastoid air cells are clear. IMPRESSION: 1. Scattered foci of acute nonhemorrhagic infarction involving the posterior right frontal lobe, the right parietal lobe, in the anterior left frontal lobe. 2. Moderate age advanced atrophy and scattered white matter changes bilaterally compatible with chronic microvascular ischemic disease. 3. Abnormal signal compatible with occlusion in the left internal carotid proximal to the ophthalmic segment and in the distal right vertebral artery. These results were called by telephone at the time of interpretation on 11/19/2015 at 2:24 pm to [place] , who verbally acknowledged these results. Electronically Signed   By: San Morelle M.D.   On: 11/19/2015 14:29       Medical Problem List and Plan: 1.  Left hemiparesthesias with balance deficits mild cognitive impairment secondary to right frontoparietal infarct after carotid angiogram. Follow-up 4 weeks outpatient vascular surgery to consider carotid enterectomy 2.  DVT Prophylaxis/Anticoagulation: Subcutaneous Lovenox. Monitor platelet counts and any signs of bleeding 3. Pain Management: Oxycodone as needed  -kpad for left neck, ROM with therapy 4. Diabetes mellitus with peripheral neuropathy. Glucose 125-197. No recent hemoglobin A1c. Glucophage 1000 mg twice a day. Check blood sugars before meals and at bedtime. Diabetic teaching 5. Neuropsych: This patient is capable of making decisions on his own behalf. 6. Skin/Wound Care: Routine skin checks 7. Fluids/Electrolytes/Nutrition: Routine I&O's with follow-up chemistries 8. Hypertension. Norvasc 10 mg daily, hydrochlorothiazide 25 mg daily, lisinopril 40 mg daily, Toprol-XL 100 mg daily. Monitor with increased activity 9. Diastolic congestive heart failure. Monitor for any signs of fluid overload. Weigh patient daily 10.CRI. Baseline  creatinine 1.40-1.48. Follow-up chemistries 11. Hyperlipidemia. Pravachol   Post Admission Physician Evaluation: 1. Functional deficits secondary  to Left hemiparesthesias with balance deficits mild cognitive impairment secondary to right frontoparietal infarct after carotid angiogram. 2. Patient is admitted to receive collaborative, interdisciplinary care between the physiatrist, rehab nursing staff, and therapy team. 3. Patient's level of medical complexity and substantial therapy needs in context of that medical necessity cannot be provided at a lesser intensity of care such as a SNF. 4. Patient has experienced substantial functional loss from his/her baseline which was documented above under the "Functional History" and "Functional Status" headings.  Judging by the patient's diagnosis, physical exam, and functional history, the patient has potential for functional progress which will result in measurable gains while on inpatient rehab.  These gains will be of substantial and practical use upon discharge  in facilitating mobility and self-care at the household level. 5. Physiatrist will provide 24 hour management of medical needs as well as oversight of the therapy plan/treatment and provide guidance as appropriate regarding the interaction of the two. 6. 24 hour rehab nursing will assist with bladder management, bowel management, safety, skin/wound care, disease management, medication administration, pain management and patient education  and help integrate therapy concepts, techniques,education, etc. 7. PT will assess and treat for/with: pre gait, gait training, endurance ,  safety, equipment, neuromuscular re education.   Goals are: Supervision. 8. OT will assess and treat for/with: ADLs, Cognitive perceptual skills, Neuromuscular re education, safety, endurance, equipment.   Goals are: Supervision. Therapy May proceed with showering this patient. 9. SLP will assess and treat for/with: Memory,  attention, problem solving, sequencing, medication management.  Goals are: Modified independent medication management. 10. Case Management and Social Worker will assess and treat for psychological issues and discharge planning. 11. Team conference will be held weekly to assess progress toward goals and to determine barriers to discharge. 12. Patient will receive at least 3 hours of therapy per day at least 5 days per week. 13. ELOS: 10-14 days       14. Prognosis:  excellent     Meredith Staggers, MD, [place] [facility] 11/22/2015

## 2015-11-22 ENCOUNTER — Inpatient Hospital Stay (HOSPITAL_COMMUNITY)
Admission: RE | Admit: 2015-11-22 | Discharge: 2015-12-04 | DRG: 057 | Disposition: A | Discharge status: Home Health | Payer: BLUE CROSS/BLUE SHIELD | Source: Intra-hospital | Attending: Physical Medicine & Rehabilitation | Admitting: Physical Medicine & Rehabilitation

## 2015-11-22 ENCOUNTER — Encounter (HOSPITAL_COMMUNITY): Payer: Self-pay | Admitting: *Deleted

## 2015-11-22 DIAGNOSIS — E785 Hyperlipidemia, unspecified: Secondary | ICD-10-CM

## 2015-11-22 DIAGNOSIS — I130 Hypertensive heart and chronic kidney disease with heart failure and stage 1 through stage 4 chronic kidney disease, or unspecified chronic kidney disease: Secondary | ICD-10-CM

## 2015-11-22 DIAGNOSIS — Z8673 Personal history of transient ischemic attack (TIA), and cerebral infarction without residual deficits: Secondary | ICD-10-CM

## 2015-11-22 DIAGNOSIS — E1122 Type 2 diabetes mellitus with diabetic chronic kidney disease: Secondary | ICD-10-CM | POA: Diagnosis not present

## 2015-11-22 DIAGNOSIS — N183 Chronic kidney disease, stage 3 (moderate): Secondary | ICD-10-CM | POA: Diagnosis not present

## 2015-11-22 DIAGNOSIS — Z7982 Long term (current) use of aspirin: Secondary | ICD-10-CM

## 2015-11-22 DIAGNOSIS — L739 Follicular disorder, unspecified: Secondary | ICD-10-CM | POA: Insufficient documentation

## 2015-11-22 DIAGNOSIS — I5032 Chronic diastolic (congestive) heart failure: Secondary | ICD-10-CM | POA: Diagnosis not present

## 2015-11-22 DIAGNOSIS — Z7984 Long term (current) use of oral hypoglycemic drugs: Secondary | ICD-10-CM | POA: Diagnosis not present

## 2015-11-22 DIAGNOSIS — I639 Cerebral infarction, unspecified: Secondary | ICD-10-CM | POA: Diagnosis present

## 2015-11-22 DIAGNOSIS — K5900 Constipation, unspecified: Secondary | ICD-10-CM | POA: Diagnosis not present

## 2015-11-22 DIAGNOSIS — D72829 Elevated white blood cell count, unspecified: Secondary | ICD-10-CM | POA: Insufficient documentation

## 2015-11-22 DIAGNOSIS — Z79899 Other long term (current) drug therapy: Secondary | ICD-10-CM

## 2015-11-22 DIAGNOSIS — R1110 Vomiting, unspecified: Secondary | ICD-10-CM

## 2015-11-22 DIAGNOSIS — R110 Nausea: Secondary | ICD-10-CM | POA: Diagnosis not present

## 2015-11-22 DIAGNOSIS — I6359 Cerebral infarction due to unspecified occlusion or stenosis of other cerebral artery: Secondary | ICD-10-CM

## 2015-11-22 DIAGNOSIS — E871 Hypo-osmolality and hyponatremia: Secondary | ICD-10-CM | POA: Insufficient documentation

## 2015-11-22 DIAGNOSIS — I4891 Unspecified atrial fibrillation: Secondary | ICD-10-CM | POA: Diagnosis not present

## 2015-11-22 DIAGNOSIS — N289 Disorder of kidney and ureter, unspecified: Secondary | ICD-10-CM | POA: Insufficient documentation

## 2015-11-22 DIAGNOSIS — R112 Nausea with vomiting, unspecified: Secondary | ICD-10-CM

## 2015-11-22 DIAGNOSIS — N189 Chronic kidney disease, unspecified: Secondary | ICD-10-CM | POA: Insufficient documentation

## 2015-11-22 DIAGNOSIS — N179 Acute kidney failure, unspecified: Secondary | ICD-10-CM | POA: Insufficient documentation

## 2015-11-22 DIAGNOSIS — I6523 Occlusion and stenosis of bilateral carotid arteries: Secondary | ICD-10-CM

## 2015-11-22 DIAGNOSIS — I69319 Unspecified symptoms and signs involving cognitive functions following cerebral infarction: Secondary | ICD-10-CM | POA: Diagnosis not present

## 2015-11-22 DIAGNOSIS — Z87891 Personal history of nicotine dependence: Secondary | ICD-10-CM

## 2015-11-22 DIAGNOSIS — I69354 Hemiplegia and hemiparesis following cerebral infarction affecting left non-dominant side: Secondary | ICD-10-CM | POA: Diagnosis not present

## 2015-11-22 DIAGNOSIS — I10 Essential (primary) hypertension: Secondary | ICD-10-CM | POA: Diagnosis not present

## 2015-11-22 DIAGNOSIS — K5901 Slow transit constipation: Secondary | ICD-10-CM | POA: Diagnosis not present

## 2015-11-22 DIAGNOSIS — G4700 Insomnia, unspecified: Secondary | ICD-10-CM

## 2015-11-22 DIAGNOSIS — Z952 Presence of prosthetic heart valve: Secondary | ICD-10-CM | POA: Diagnosis not present

## 2015-11-22 DIAGNOSIS — I6339 Cerebral infarction due to thrombosis of other cerebral artery: Secondary | ICD-10-CM

## 2015-11-22 DIAGNOSIS — E1142 Type 2 diabetes mellitus with diabetic polyneuropathy: Secondary | ICD-10-CM | POA: Insufficient documentation

## 2015-11-22 LAB — CBC WITH DIFFERENTIAL/PLATELET
BASOS ABS: 0 10*3/uL (ref 0.0–0.1)
BASOS PCT: 0 %
EOS ABS: 0.1 10*3/uL (ref 0.0–0.7)
EOS PCT: 1 %
HCT: 41.8 % (ref 39.0–52.0)
HEMOGLOBIN: 14 g/dL (ref 13.0–17.0)
Lymphocytes Relative: 16 %
Lymphs Abs: 1.7 10*3/uL (ref 0.7–4.0)
MCH: 27.8 pg (ref 26.0–34.0)
MCHC: 33.5 g/dL (ref 30.0–36.0)
MCV: 83.1 fL (ref 78.0–100.0)
Monocytes Absolute: 1.6 10*3/uL — ABNORMAL HIGH (ref 0.1–1.0)
Monocytes Relative: 15 %
NEUTROS ABS: 7.6 10*3/uL (ref 1.7–7.7)
Neutrophils Relative %: 68 %
PLATELETS: 196 10*3/uL (ref 150–400)
RBC: 5.03 MIL/uL (ref 4.22–5.81)
RDW: 13.3 % (ref 11.5–15.5)
WBC: 11.1 10*3/uL — ABNORMAL HIGH (ref 4.0–10.5)

## 2015-11-22 LAB — GLUCOSE, CAPILLARY
GLUCOSE-CAPILLARY: 140 mg/dL — AB (ref 65–99)
Glucose-Capillary: 156 mg/dL — ABNORMAL HIGH (ref 65–99)
Glucose-Capillary: 134 mg/dL — ABNORMAL HIGH (ref 65–99)
Glucose-Capillary: 132 mg/dL — ABNORMAL HIGH (ref 65–99)

## 2015-11-22 LAB — COMPREHENSIVE METABOLIC PANEL
ALBUMIN: 3.8 g/dL (ref 3.5–5.0)
ALK PHOS: 57 U/L (ref 38–126)
ALT: 23 U/L (ref 17–63)
AST: 16 U/L (ref 15–41)
Anion gap: 13 (ref 5–15)
BUN: 41 mg/dL — AB (ref 6–20)
CALCIUM: 9.5 mg/dL (ref 8.9–10.3)
CHLORIDE: 97 mmol/L — AB (ref 101–111)
CO2: 24 mmol/L (ref 22–32)
CREATININE: 1.94 mg/dL — AB (ref 0.61–1.24)
GFR calc non Af Amer: 33 mL/min — ABNORMAL LOW (ref >60)
GFR, EST AFRICAN AMERICAN: 38 mL/min — AB (ref >60)
Glucose, Bld: 148 mg/dL — ABNORMAL HIGH (ref 65–99)
Potassium: 4.3 mmol/L (ref 3.5–5.1)
SODIUM: 134 mmol/L — AB (ref 135–145)
Total Bilirubin: 0.8 mg/dL (ref 0.3–1.2)
Total Protein: 6.9 g/dL (ref 6.5–8.1)

## 2015-11-22 MED ORDER — AMLODIPINE BESYLATE 10 MG PO TABS
10.0000 mg | ORAL_TABLET | Freq: Every day | ORAL | Status: DC
  Administered 2015-11-23 – 2015-12-04 (×12): 10 mg via ORAL
  Filled 2015-11-22 (×12): qty 1

## 2015-11-22 MED ORDER — OXYCODONE-ACETAMINOPHEN 5-325 MG PO TABS
1.0000 | ORAL_TABLET | ORAL | Status: DC | PRN
  Administered 2015-11-23: 1 via ORAL
  Administered 2015-11-27 – 2015-11-29 (×2): 2 via ORAL
  Filled 2015-11-22: qty 1
  Filled 2015-11-22 (×2): qty 2

## 2015-11-22 MED ORDER — ACETAMINOPHEN 325 MG PO TABS: 650.0000 mg | ORAL_TABLET | ORAL | Status: DC | PRN

## 2015-11-22 MED ORDER — INSULIN ASPART 100 UNIT/ML ~~LOC~~ SOLN
0.0000 [IU] | Freq: Three times a day (TID) | SUBCUTANEOUS | Status: DC
  Administered 2015-11-23: 1 [IU] via SUBCUTANEOUS
  Administered 2015-11-23 (×2): 2 [IU] via SUBCUTANEOUS
  Administered 2015-11-24 (×2): 1 [IU] via SUBCUTANEOUS
  Administered 2015-11-25: 2 [IU] via SUBCUTANEOUS
  Administered 2015-11-25 – 2015-12-02 (×16): 1 [IU] via SUBCUTANEOUS
  Administered 2015-12-03: 2 [IU] via SUBCUTANEOUS
  Administered 2015-12-03 – 2015-12-04 (×3): 1 [IU] via SUBCUTANEOUS

## 2015-11-22 MED ORDER — ONDANSETRON HCL 4 MG PO TABS
4.0000 mg | ORAL_TABLET | Freq: Four times a day (QID) | ORAL | Status: DC | PRN
  Administered 2015-11-23 – 2015-11-28 (×2): 4 mg via ORAL
  Filled 2015-11-22 (×2): qty 1

## 2015-11-22 MED ORDER — METFORMIN HCL 500 MG PO TABS
1000.0000 mg | ORAL_TABLET | Freq: Two times a day (BID) | ORAL | Status: DC
  Administered 2015-11-22 – 2015-11-29 (×14): 1000 mg via ORAL
  Filled 2015-11-22 (×13): qty 2

## 2015-11-22 MED ORDER — ENOXAPARIN SODIUM 40 MG/0.4ML ~~LOC~~ SOLN: 40.0000 mg | SUBCUTANEOUS | Status: DC

## 2015-11-22 MED ORDER — ONDANSETRON HCL 4 MG/2ML IJ SOLN: 4.0000 mg | Freq: Four times a day (QID) | INTRAMUSCULAR | Status: DC | PRN

## 2015-11-22 MED ORDER — ENOXAPARIN SODIUM 40 MG/0.4ML ~~LOC~~ SOLN
40.0000 mg | Freq: Every day | SUBCUTANEOUS | Status: DC
  Filled 2015-11-22: qty 0.4

## 2015-11-22 MED ORDER — LISINOPRIL 40 MG PO TABS
40.0000 mg | ORAL_TABLET | Freq: Every day | ORAL | Status: DC
  Administered 2015-11-23 – 2015-12-04 (×12): 40 mg via ORAL
  Filled 2015-11-22 (×12): qty 1

## 2015-11-22 MED ORDER — PRAVASTATIN SODIUM 40 MG PO TABS
40.0000 mg | ORAL_TABLET | Freq: Every day | ORAL | Status: DC
  Administered 2015-11-23 – 2015-12-04 (×12): 40 mg via ORAL
  Filled 2015-11-22 (×12): qty 1

## 2015-11-22 MED ORDER — PANTOPRAZOLE SODIUM 40 MG PO TBEC
40.0000 mg | DELAYED_RELEASE_TABLET | Freq: Every day | ORAL | Status: DC
  Administered 2015-11-23 – 2015-12-04 (×12): 40 mg via ORAL
  Filled 2015-11-22 (×12): qty 1

## 2015-11-22 MED ORDER — ASPIRIN EC 81 MG PO TBEC
81.0000 mg | DELAYED_RELEASE_TABLET | Freq: Every day | ORAL | Status: DC
  Administered 2015-11-23 – 2015-12-04 (×12): 81 mg via ORAL
  Filled 2015-11-22 (×12): qty 1

## 2015-11-22 MED ORDER — SORBITOL 70 % SOLN
30.0000 mL | Freq: Every day | Status: DC | PRN
  Administered 2015-11-23: 30 mL via ORAL
  Filled 2015-11-22 (×2): qty 30

## 2015-11-22 MED ORDER — HYDROCHLOROTHIAZIDE 25 MG PO TABS
25.0000 mg | ORAL_TABLET | Freq: Every day | ORAL | Status: DC
  Administered 2015-11-23 – 2015-12-02 (×10): 25 mg via ORAL
  Filled 2015-11-22 (×11): qty 1

## 2015-11-22 MED ORDER — METOPROLOL SUCCINATE ER 50 MG PO TB24
100.0000 mg | ORAL_TABLET | Freq: Every day | ORAL | Status: DC
  Administered 2015-11-23 – 2015-12-04 (×12): 100 mg via ORAL
  Filled 2015-11-22 (×12): qty 2

## 2015-11-22 NOTE — Consult Note (Signed)
NAMEJAIVON, VANBEEK NO.:  [id number]  MEDICAL RECORD NO.:  [id number]  LOCATION:  2W24C                        FACILITY:  [facility]  PHYSICIAN:  Latrell Reitan K. Donalee Gaumond, M.D.DATE OF BIRTH:  Feb 07, 1941  DATE OF CONSULTATION: DATE OF DISCHARGE:                                CONSULTATION   EXAMINATION:  Intracranial interpretation of right common carotid arteriogram.  CLINICAL HISTORY:  Right common carotid artery and internal carotid artery stenoses.  FINDINGS:  The right internal carotid artery  In the distal cervical, petrous and  the cavernous segments appears widely patent.  Minimal atherosclerotic irregularity is noted in the petrous cavernous junction.  There is a narrowing of approximately 50% involving the mid  M1  region of the right middle cerebral artery.  More distally, the MCA branches appear to opacify into the capillary and venous phases.  The right anterior cerebral artery is seen to opacify normally into the capillary and venous phases.  Prompt cross filling via the anterior communicating artery of the left anterior cerebral A2 segment is seen.  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  IMPRESSION: 1. Suggestion of approximately 50% stenosis of the right middle     cerebral artery M1 segment. 2. Mild atherosclerotic irregularity involving the petrous cavernous     junction of the right internal carotid artery.          ______________________________ Fritz Pickerel Estanislado Pandy, M.D.     SKD/MEDQ  D:  11/21/2015  T:  11/22/2015  Job:  [id number]

## 2015-11-22 NOTE — Progress Notes (Signed)
Meredith Staggers, MD Physician Signed Physical Medicine and Rehabilitation Consult Note 11/19/2015 12:54 PM  Related encounter: Admission (Current) from 11/18/2015 in [facility] Collapse All        Physical Medicine and Rehabilitation Consult Reason for Consult: Acute nonhemorrhagic infarct posterior right frontal lobe, parietal lobe Referring Physician: Dr. Bridgett Larsson   HPI: Evan Stanley is a 75 y.o. right handed male with history of hypertension, diabetes mellitus, diastolic congestive heart failure, atrial fibrillation maintained on aspirin 81 mg as well as TIA in the past. Patient lives with spouse independent prior to admission. One level home. Wife works during the day. Presented 11/18/2015 with known carotid artery stenosis diagnosed April 2016 by cardiology services right ICA greater than 80%, left ICA occluded. Underwent elective right carotid angiogram 11/18/2015 per Dr. Bridgett Larsson for workup and plan carotid surgery. Noted post angiogram with left sided weakness. Code stroke was called. CT of the head showed no acute cortically based infarct or acute intracranial abnormality. MRI of the brain showed scattered foci of acute nonhemorrhagic infarct involving the posterior right frontal lobe, right parietal lobe . Neurology follow-up with workup ongoing. Echocardiogram pending. Currently maintained on aspirin 81 mg daily. Subcutaneous Lovenox for DVT prophylaxis. Regular consistency diet. Physical therapy evaluation completed 11/19/2015 with recommendations of physical medicine rehabilitation consult.   Review of Systems  Constitutional: Negative for fever and chills.  HENT: Negative for hearing loss.  Eyes: Positive for blurred vision. Negative for double vision.  Respiratory: Negative for cough and shortness of breath.  Cardiovascular: Positive for palpitations and leg swelling. Negative for chest pain.  Gastrointestinal: Positive for constipation.  Negative for nausea and vomiting.  Genitourinary: Positive for dysuria. Negative for hematuria.  Musculoskeletal: Positive for myalgias and joint pain.  Neurological: Positive for dizziness and weakness. Negative for seizures, loss of consciousness and headaches.  Psychiatric/Behavioral:   Anxiety  All other systems reviewed and are negative.  Past Medical History  Diagnosis Date  . Diabetes mellitus without complication ([place])   . Hyperlipidemia   . Hypertension   . CHF (congestive heart failure) ([place])   . Anxiety   . Atrial fibrillation ([place])   . Carotid artery stenosis   . TIA (transient ischemic attack)   . Decreased vision     left eye   Past Surgical History  Procedure Laterality Date  . Cardiac valve replacement    . Aortic valve replacement    . Back surgery    . Laser surgery, left eye  Left 10-29-2015     retinal surgery/ Deloria Lair MD   . Peripheral vascular catheterization Right 11/18/2015    Procedure: Carotid Angiography; Surgeon: Conrad Arion, MD; Location: [facility]; Service: Cardiovascular; Laterality: Right;  . Peripheral vascular catheterization N/A 11/18/2015    Procedure: Aortic Arch Angiography; Surgeon: Conrad Palmyra, MD; Location: [facility]; Service: Cardiovascular; Laterality: N/A;   Family History  Problem Relation Age of Onset  . Heart disease Mother   . Stroke Father 41   Social History:  reports that he quit smoking about 31 years ago. He has never used smokeless tobacco. He reports that he does not drink alcohol or use illicit drugs. Allergies: No Known Allergies Medications Prior to Admission  Medication Sig Dispense Refill  . amLODipine (NORVASC) 10 MG tablet Take 1 tablet (10 mg total) by mouth daily. 90 tablet 1  . aspirin EC 81 MG tablet Take 1 tablet (81  mg total) by mouth daily. 90 tablet 3  . hydrochlorothiazide  (HYDRODIURIL) 25 MG tablet Take 1 tablet (25 mg total) by mouth daily. 90 tablet 1  . lisinopril (PRINIVIL,ZESTRIL) 40 MG tablet TAKE 1 TABLET DAILY 90 tablet 1  . metFORMIN (GLUCOPHAGE) 1000 MG tablet Take 1 tablet (1,000 mg total) by mouth 2 (two) times daily with a meal. 180 tablet 1  . metoprolol succinate (TOPROL-XL) 100 MG 24 hr tablet Take 1 tablet (100 mg total) by mouth daily. Take with or immediately following a meal. 90 tablet 1  . pravastatin (PRAVACHOL) 40 MG tablet Take 40 mg by mouth daily.    Marland Kitchen terazosin (HYTRIN) 1 MG capsule Take 1 capsule (1 mg total) by mouth at bedtime. (Patient not taking: Reported on 09/20/2015) 90 capsule 1    Home: [place] expects to be discharged to:: Private residence Living Arrangements: Spouse/significant other Available Help at Discharge: Family, Available 24 hours/day Type of Home: House Home Access: Stairs to enter Technical brewer of Steps: 1 Entrance Stairs-Rails: None Home Layout: Laundry or work area in basement, One level [employer] Shower/Tub: Multimedia programmer: [place]: None  Functional History: Prior Function Level of Independence: Independent Functional Status:  Mobility: Bed Mobility General bed mobility comments: up in [place] Overall transfer level: Needs assistance Equipment used: Rolling walker (2 wheeled) Transfers: Sit to/from Stand Sit to Stand: Max assist General transfer comment: lifting assist and posterior LOB in standing, cues for hand placement and poor placement for sit to stand with R foot to far forward; stand to sit max cues for safety and mod A Ambulation/Gait Ambulation/Gait assistance: Mod assist, Max assist Ambulation Distance (Feet): 75 Feet Assistive device: Rolling walker (2 wheeled) Gait Pattern/deviations: Step-to pattern, Decreased stride length, Trendelenburg, Decreased weight shift to right, Staggering  left General Gait Details: L hip and knee buckles in stance despite cues for stance stability and facilitation at L hip throughout; cues for R weight shift and assist for L foot progression, cues for safety and assist with walker for shorter steps with turning for safety    ADL:    Cognition: Cognition Overall Cognitive Status: Within Functional Limits for tasks assessed Orientation Level: Oriented X4 Cognition Arousal/Alertness: Awake/alert Behavior During Therapy: WFL for tasks assessed/performed Overall Cognitive Status: Within Functional Limits for tasks assessed  Blood pressure 138/64, pulse 69, temperature 97.8 F (36.6 C), temperature source Oral, resp. rate 18, height 5' 5"  (1.651 m), weight 79.833 kg (176 lb), SpO2 97 %. Physical Exam  Vitals reviewed. Constitutional: He is oriented to person, place, and time. He appears well-developed.  HENT:  Head: Normocephalic.  Eyes: EOM are normal.  Neck: Normal range of motion. Neck supple. No thyromegaly present.  Cardiovascular:  Cardiac rate control  Respiratory: Effort normal and breath sounds normal. No respiratory distress.  GI: Soft. Bowel sounds are normal. He exhibits no distension.  Neurological: He is alert and oriented to person, place, and time.  Good awareness and insight. Incomplete LLQ field cut. Seems to be fairly aware of left side. Follows commands. Moves all extremities. Decreased fine motor skills on left. Mild left limb ataxia. MMT: 5/5 RUE and 4+/5 LUE. RLE: 4+ to 5/5. LLE: 4 to 4/5. Senses pain on left side.  Skin: Skin is warm and dry.     Lab Results Last 24 Hours    Results for orders placed or performed during the hospital encounter of 11/18/15 (from the past 24 hour(s))  Glucose, capillary Status:  Abnormal   Collection Time: 11/18/15 1:40 PM  Result Value Ref Range   Glucose-Capillary 156 (H) 65 - 99 mg/dL  Glucose, capillary Status: Abnormal   Collection Time: 11/18/15  6:53 PM  Result Value Ref Range   Glucose-Capillary 173 (H) 65 - 99 mg/dL   Comment 1 Notify RN   CBC Status: None   Collection Time: 11/18/15 8:41 PM  Result Value Ref Range   WBC 9.1 4.0 - 10.5 K/uL   RBC 5.15 4.22 - 5.81 MIL/uL   Hemoglobin 14.4 13.0 - 17.0 g/dL   HCT 42.8 39.0 - 52.0 %   MCV 83.1 78.0 - 100.0 fL   MCH 28.0 26.0 - 34.0 pg   MCHC 33.6 30.0 - 36.0 g/dL   RDW 13.3 11.5 - 15.5 %   Platelets 211 150 - 400 K/uL  Comprehensive metabolic panel Status: Abnormal   Collection Time: 11/18/15 8:41 PM  Result Value Ref Range   Sodium 143 135 - 145 mmol/L   Potassium 4.3 3.5 - 5.1 mmol/L   Chloride 106 101 - 111 mmol/L   CO2 26 22 - 32 mmol/L   Glucose, Bld 157 (H) 65 - 99 mg/dL   BUN 16 6 - 20 mg/dL   Creatinine, Ser 1.43 (H) 0.61 - 1.24 mg/dL   Calcium 9.9 8.9 - 10.3 mg/dL   Total Protein 6.9 6.5 - 8.1 g/dL   Albumin 4.1 3.5 - 5.0 g/dL   AST 19 15 - 41 U/L   ALT 25 17 - 63 U/L   Alkaline Phosphatase 53 38 - 126 U/L   Total Bilirubin 0.7 0.3 - 1.2 mg/dL   GFR calc non Af Amer 47 (L) >60 mL/min   GFR calc Af Amer 55 (L) >60 mL/min   Anion gap 11 5 - 15  Protime-INR Status: None   Collection Time: 11/18/15 8:41 PM  Result Value Ref Range   Prothrombin Time 13.6 11.6 - 15.2 seconds   INR 1.02 0.00 - 1.49  Urinalysis, Routine w reflex microscopic (not at [facility]) Status: None   Collection Time: 11/18/15 10:24 PM  Result Value Ref Range   Color, Urine YELLOW YELLOW   APPearance CLEAR CLEAR   Specific Gravity, Urine 1.015 1.005 - 1.030   pH 7.5 5.0 - 8.0   Glucose, UA NEGATIVE NEGATIVE mg/dL   Hgb urine dipstick NEGATIVE NEGATIVE   Bilirubin Urine NEGATIVE NEGATIVE   Ketones, ur NEGATIVE NEGATIVE mg/dL   Protein, ur NEGATIVE NEGATIVE mg/dL   Nitrite NEGATIVE  NEGATIVE   Leukocytes, UA NEGATIVE NEGATIVE  Glucose, capillary Status: Abnormal   Collection Time: 11/19/15 12:33 PM  Result Value Ref Range   Glucose-Capillary 195 (H) 65 - 99 mg/dL      Imaging Results (Last 48 hours)    Ct Head Code Stroke W/o Cm  11/18/2015 ADDENDUM REPORT: 11/18/2015 20:09 ADDENDUM: Dr. Norman Clay was paged regarding the results of this exam at [place] hours on 11/18/2015. Electronically Signed By: Genevie Ann M.D. On: 11/18/2015 20:09  11/18/2015 CLINICAL DATA: 75 year old male with onset of unsteady gait. Stroke-like symptoms. Initial encounter. EXAM: CT HEAD WITHOUT CONTRAST TECHNIQUE: Contiguous axial images were obtained from the base of the skull through the vertex without intravenous contrast. COMPARISON: Neck CTA 11/02/2014. Head CT without contrast 10/22/2014. FINDINGS: Trace paranasal sinus mucosal thickening. Mastoids remain clear. No acute osseous abnormality identified. Stable orbit and scalp soft tissues. Fairly extensive calcified atherosclerosis at the skullbase re- demonstrated. Chronic cerebral volume loss is stable. No midline shift,  mass effect, or evidence of intracranial mass lesion. No acute intracranial hemorrhage identified. No ventriculomegaly. No acute cortically based infarct identified. No suspicious intracranial vascular hyperdensity. Stable gray-white matter differentiation. ASPECTS score = 10. IMPRESSION: No acute cortically based infarct or acute intracranial abnormality identified. Electronically Signed: By: Genevie Ann M.D. On: 11/18/2015 19:38     Assessment/Plan: Diagnosis: right frontal-parietal infarct with left hemiparesis and balance deficits 1. Does the need for close, 24 hr/day medical supervision in concert with the patient's rehab needs make it unreasonable for this patient to be served in a less intensive setting? Yes 2. Co-Morbidities requiring supervision/potential complications: htn, cad, dm, CAS 3. Due to  bladder management, bowel management, safety, skin/wound care, disease management, medication administration, pain management and patient education, does the patient require 24 hr/day rehab nursing? Yes 4. Does the patient require coordinated care of a physician, rehab nurse, PT (1-2 hrs/day, 5 days/week) and OT (1-2 hrs/day, 5 days/week) to address physical and functional deficits in the context of the above medical diagnosis(es)? Yes Addressing deficits in the following areas: balance, endurance, locomotion, strength, transferring, bowel/bladder control, bathing, dressing, feeding, grooming, toileting and psychosocial support 5. Can the patient actively participate in an intensive therapy program of at least 3 hrs of therapy per day at least 5 days per week? Yes 6. The potential for patient to make measurable gains while on inpatient rehab is excellent 7. Anticipated functional outcomes upon discharge from inpatient rehab are modified independent with PT, modified independent with OT, n/a with SLP. 8. Estimated rehab length of stay to reach the above functional goals is: 7-9 days 9. Does the patient have adequate social supports and living environment to accommodate these discharge functional goals? Yes 10. Anticipated D/C setting: Home 11. Anticipated post D/C treatments: HH therapy and Outpatient therapy 12. Overall Rehab/Functional Prognosis: excellent  RECOMMENDATIONS: This patient's condition is appropriate for continued rehabilitative care in the following setting: CIR Patient has agreed to participate in recommended program. Yes Note that insurance prior authorization may be required for reimbursement for recommended care.  Comment: Rehab Admissions Coordinator to follow up.  Thanks,  Meredith Staggers, MD, Mellody Drown     11/19/2015       Revision History     Date/Time User Provider Type Action   11/19/2015 3:41 PM Meredith Staggers, MD Physician Sign   11/19/2015 2:56 PM Cathlyn Parsons, PA-C Physician Assistant [place]   View Details Report       Routing History     Date/Time From To Method   11/19/2015 3:41 PM Meredith Staggers, MD Wardell Honour, MD In [place]

## 2015-11-22 NOTE — Discharge Summary (Signed)
Discharge Summary    Evan Stanley 1941-05-13 75 y.o. male  [id number]  Admission Date: 11/18/2015  Discharge Date: 11/22/15  Physician: Conrad , MD  Admission Diagnosis: pvd   HPI:   This is a 75 y.o. male patient of Dr. Bridgett Larsson who presents for follow up of carotid artery stenosis. This patient previous has had an episode of left eye visual field loss, about 2014, which was attributed to stroke. He had a <1 hour episode of slurring of speech about April 2016. A CT at that point was not diagnostic. He saw his cardiologist in April 2016 for B carotid studies which demonstrated: RICA >56% stenosis, LICA: occluded. The patient has never had amaurosis fugax but has developed partial visual field loss. The patient has never had facial drooping or hemiplegia. The patient has had expressive aphasia. The patient's previous neurologic deficits have resolved and he denies any subsequent stroke or TIA symptoms. The patient's risks factors for carotid disease include: DM, HLD, HTN, afib. Dr. Bridgett Larsson last saw pt on 11/02/14. At that time pt had asx R ICA stenosis 60%, L ICA occlusion by CTA. The technique of the CTA appeared to be good, so Dr. Bridgett Larsson was inclined to believe the CTA more than the carotid duplex which is dependent on operator expertise.Based on the Dr. Bridgett Larsson offered the patient: maximal medical mgmt and follow in 6 months with B carotid duplex. If velocities in R ICA continue to be elevated, carotid angiography might be necessary to determine exact level of the disease, given the surgical difficulty with placement of shunt with a high lesion.  About 3 weeks ago he had an episode of dizziness after about 10 steps after getting out of his car which resolved after he sat down, no other accompanying neurological events.   Pt had a laser procedure performed on his left eye in April 2017 to prevent his left eye from being removed; pt states he was told by Dr. Zadie Rhine that he had a stroke  in the left eye.   Pt Diabetic: yes, 7.3 A1C March 2017 (review of records) Pt smoker: former smoker, quit in [facility] Course:  The patient was admitted to the hospital and taken to the [facility] lab on 11/18/2015 and underwent: 1. Right common femoral artery cannulation under ultrasound guidance 2. Placement of catheter in aorta 3. Arch Aortogram 4. Right carotid and cerebral angiogram    Intraoperative findings included:  Patent type aortic arch  Patent innominate artery, left common carotid artery and left subclavian artery  Patent right subclavian artery and right common carotid artery  Patent right internal carotid artery with stenosis >80%  Occluded right external carotid artery   Occluded left internal carotid artery   Intracranial findings per neuroradiology  The pt tolerated the procedure well and was transported to the PACU in good condition.   Later that evening, Dr. Donnetta Hutching was called to see pt with clumsiness of His left leg and possibly left arm. Had diagnostic cerebral arteriogram earlier today. This showedleft internal carotid artery occlusion and greater than 80%right internal carotid artery stenosis. He appears to have normal grip strength. He had been flat in bed with no change in his neurologic status. On attempting to walk after his bedrest. Was over, he was unable to stand due to Clumsiness in his left leg and he was placed back in bed. He can raise his left leg against gravity.He can raise his foot and press down with his foot.Unable to stand and putweight  on his foot.  Discussed with the patient present. Concerned about small embolus to his right brain.Will call a code stroke for evaluation. Explained that the probably undergo CT scan and potentially further evaluation tonight. Explained that he would be admitted for observation tonight.  Neurology was consulted.  He was started on aspirin 355m daily.  PT/OT were consulted.  MRI results: IMPRESSION: 1.  Scattered foci of acute nonhemorrhagic infarction involving the posterior right frontal lobe, the right parietal lobe, in the anterior left frontal lobe. 2. Moderate age advanced atrophy and scattered white matter changes bilaterally compatible with chronic microvascular ischemic disease. 3. Abnormal signal compatible with occlusion in the left internal carotid proximal to the ophthalmic segment and in the distal right vertebral artery.  Echocardiogram: Study Conclusions  - Left ventricle: Posterior basal hypokinesis The cavity size was  normal. Wall thickness was normal. The estimated ejection  fraction was 55%. - Aortic valve: Normal appearing bioprosthetic AV. Valve area  (VTI): 1.09 cm^2. Valve area (Vmax): 0.95 cm^2. Valve area  (Vmean): 1.06 cm^2. - Mitral valve: Calcified annulus. Mildly thickened leaflets . - Left atrium: The atrium was mildly dilated. - Atrial septum: No defect or patent foramen ovale was identified.  Impressions: - No cardiac source of emboli was indentified.  Carotid Disease:  Right Carotid stenosis > 80%  R ECA occluded  L ICA occluded  Treatment per Dr. CBridgett Larsson Hyperlipidemia  on Pravachol 40 continued in hospital  LDL 110 in March, goal < 70  Continue statin at discharge  Atrial Fibrillation, not on anticoagulation  HgA1c:  7.3 in March  On POD 1, per Dr. CBridgett Larsson the pt previously had a TIA involving slurring of speech previously and visual field loss in L eye.  PT & OT recommended CIR consult.  Speech pathology consult was also obtained.  Pt presents with mild deficits in short-term and working memory, awareness, and abstract reasoning s/p right CVA. He was quite active and social PTA. Recommend SLP f/u at CIR to address the aforementioned deficits. Pt/wife agree with plan.   POD 2, Dr. FOneida Alarwas called for Pt with ground level fall after getting off bedside commode. Says he slid down to floor and hit left knee. Denies hitting  head.   On exam no point tenderness at knee. No contusion or hematoma on head  Discussed with pt getting assistance to avoid falls no obvious injury  By POD 4, his walking was improved as well as motor.  Insurance for CIR is approved and pt is transferred to CIR.  The remainder of the hospital course consisted of increasing mobilization and increasing intake of solids without difficulty.  CBC    Component Value Date/Time   WBC 9.1 11/18/2015 2041   WBC 8.7 04/16/2014 1002   RBC 5.15 11/18/2015 2041   RBC 5.4 04/16/2014 1002   HGB 14.4 11/18/2015 2041   HGB 15.0 04/16/2014 1002   HCT 42.8 11/18/2015 2041   HCT 45.2 04/16/2014 1002   PLT 211 11/18/2015 2041   MCV 83.1 11/18/2015 2041   MCV 83.8 04/16/2014 1002   MCH 28.0 11/18/2015 2041   MCH 27.8 04/16/2014 1002   MCHC 33.6 11/18/2015 2041   MCHC 33.1 04/16/2014 1002   RDW 13.3 11/18/2015 2041   LYMPHSABS 2.5 10/22/2014 1245   MONOABS 0.9 10/22/2014 1245   EOSABS 0.2 10/22/2014 1245   BASOSABS 0.1 10/22/2014 1245    BMET    Component Value Date/Time   NA 143 11/18/2015 2041  NA 141 09/20/2015 0807   K 4.3 11/18/2015 2041   CL 106 11/18/2015 2041   CO2 26 11/18/2015 2041   GLUCOSE 157* 11/18/2015 2041   GLUCOSE 195* 09/20/2015 0807   BUN 16 11/18/2015 2041   BUN 12 09/20/2015 0807   CREATININE 1.43* 11/18/2015 2041   CALCIUM 9.9 11/18/2015 2041   GFRNONAA 47* 11/18/2015 2041   GFRAA 55* 11/18/2015 2041      Discharge Instructions    Ambulatory referral to Neurology    Complete by:  As directed   Stroke office f/u in 2 months     CAROTID [place]: Call MD for difficulty swallowing or speaking; weakness in arms or legs that is a new symtom; severe headache.  If you have increased swelling in the neck and/or  are having difficulty breathing, CALL 911    Complete by:  As directed      Call MD for:  redness, tenderness, or signs of infection (pain, swelling, bleeding, redness, odor or green/yellow discharge  around incision site)    Complete by:  As directed      Call MD for:  severe or increased pain, loss or decreased feeling  in affected limb(s)    Complete by:  As directed      Call MD for:  temperature >100.5    Complete by:  As directed      Driving Restrictions    Complete by:  As directed   No driving for 2 weeks     Lifting restrictions    Complete by:  As directed   No lifting for 2 weeks     Resume previous diet    Complete by:  As directed            Discharge Diagnosis:  pvd  Secondary Diagnosis: Patient Active Problem List   Diagnosis Date Noted  . Acute CVA (cerebrovascular accident) ([place]) 11/19/2015  . Carotid artery disease ([place]) 11/18/2015  . Carotid artery occlusion without infarction 10/26/2014  . Carotid stenosis 10/26/2014  . Status post aortic valve replacement with bioprosthetic valve 07/05/2013  . Hyperlipidemia 07/05/2013  . Essential hypertension 07/05/2013  . CAD (coronary artery disease)   . DM (diabetes mellitus) ([place])    Past Medical History  Diagnosis Date  . Diabetes mellitus without complication ([place])   . Hyperlipidemia   . Hypertension   . CHF (congestive heart failure) ([place])   . Anxiety   . Atrial fibrillation ([place])   . Carotid artery stenosis   . TIA (transient ischemic attack)   . Decreased vision     left eye       Medication List    STOP taking these medications        terazosin 1 MG capsule  Commonly known as:  HYTRIN      TAKE these medications        amLODipine 10 MG tablet  Commonly known as:  NORVASC  Take 1 tablet (10 mg total) by mouth daily.     aspirin EC 81 MG tablet  Take 1 tablet (81 mg total) by mouth daily.     hydrochlorothiazide 25 MG tablet  Commonly known as:  HYDRODIURIL  Take 1 tablet (25 mg total) by mouth daily.     lisinopril 40 MG tablet  Commonly known as:  PRINIVIL,ZESTRIL  TAKE 1 TABLET DAILY     metFORMIN 1000 MG tablet  Commonly known as:  GLUCOPHAGE  Take 1 tablet (1,000 mg  total) by mouth 2 (two)  times daily with a meal.     metoprolol succinate 100 MG 24 hr tablet  Commonly known as:  TOPROL-XL  Take 1 tablet (100 mg total) by mouth daily. Take with or immediately following a meal.     pravastatin 40 MG tablet  Commonly known as:  PRAVACHOL  Take 40 mg by mouth daily.        Prescriptions given: none  Disposition: CIR  Patient's condition: is Good  Follow up: 1. Dr. Bridgett Larsson in 4 weeks   Leontine Locket, PA-C [facility] [phone] 11/22/2015  9:27 AM   Addendum  I have independently interviewed and examined the patient, and I agree with the physician assistant's findings.  This patient has a history of R ICA stenosis >80% that was likely to be high.  He underwent a R carotid angiogram given concern he would need a carotid stent to address his carotid disease.  This procedure was uneventful but post-operatively when he tried to ambulate he had no left leg strength.  A Code stroke was called.  The subsequent work-up was consistent with acute stroke in right brain.  He has been recovering and has regained much of his strength.  He was felt to be a good candidate for CIR for continued stroke related PT/OT.   I will reevaluate him in the office in 4 week for a R CEA at that time.  Further cardiac evaluation will also be necessary before booking him for the OR.  [name], MD [facility] [place] Office: [phone] Pager: [phone]  11/22/2015, 3:55 PM

## 2015-11-22 NOTE — Interval H&P Note (Signed)
Evan Stanley was admitted today to Inpatient Rehabilitation with the diagnosis of CVA.  The patient's history has been reviewed, patient examined, and there is no change in status.  Patient continues to be appropriate for intensive inpatient rehabilitation.  I have reviewed the patient's chart and labs.  Questions were answered to the patient's satisfaction. The PAPE has been reviewed and assessment remains appropriate.  Evan Stanley T 11/22/2015, 6:22 PM

## 2015-11-22 NOTE — H&P (View-Only) (Signed)
Physical Medicine and Rehabilitation Admission H&P    Chief complaint: Weakness  HPI: Evan Stanley is a 75 y.o. right handed male with history of hypertension, chronic renal insufficiency with baseline creatinine 1.40-1.48 diabetes mellitus, diastolic congestive heart failure, atrial fibrillation maintained on aspirin 81 mg as well as TIA in the past. Patient lives with spouse independent prior to admission. One level home. Wife works during the day. Presented 11/18/2015 with known carotid artery stenosis diagnosed April 2016 by cardiology services right ICA greater than 80%, left ICA occluded. Underwent elective right carotid angiogram 11/18/2015 per Dr. Bridgett Larsson for workup and plan carotid surgery. Noted post angiogram with left sided weakness. Code stroke was called. CT of the head showed no acute cortically based infarct or acute intracranial abnormality. MRI of the brain showed scattered foci of acute nonhemorrhagic infarct involving the posterior right frontal lobe, right parietal lobe . Neurology has been following-up with the patient. Patient did not receive TPA.. Echocardiogram with ejection fraction of 55% without PFO.. Currently maintained on aspirin 81 mg daily. Subcutaneous Lovenox for DVT prophylaxis. Plan is to follow-up with vascular surgery 4 weeks outpatient to consider carotid surgery. Regular consistency diet. Physical and occupational therapy evaluations completed  with recommendations of physical medicine rehabilitation consult. Patient was admitted for a comprehensive rehabilitation program  ROS Constitutional: Negative for fever and chills.  HENT: Negative for hearing loss.  Eyes: Positive for blurred vision. Negative for double vision.  Respiratory: Negative for cough and shortness of breath.  Cardiovascular: Positive for palpitations and leg swelling. Negative for chest pain.  Gastrointestinal: Positive for constipation. Negative for nausea and vomiting.  Genitourinary:  Positive for dysuria. Negative for hematuria.  Musculoskeletal: Positive for myalgias and joint pain. Left sided neck pain. Neurological: Positive for dizziness and weakness. Negative for seizures, loss of consciousness and headaches.  Psychiatric/Behavioral:   Anxiety  All other systems reviewed and are negative   Past Medical History  Diagnosis Date  . Diabetes mellitus without complication ([place])   . Hyperlipidemia   . Hypertension   . CHF (congestive heart failure) ([place])   . Anxiety   . Atrial fibrillation ([place])   . Carotid artery stenosis   . TIA (transient ischemic attack)   . Decreased vision     left eye   Past Surgical History  Procedure Laterality Date  . Cardiac valve replacement    . Aortic valve replacement    . Back surgery    . Laser surgery, left eye  Left 10-29-2015     retinal surgery/ Deloria Lair MD   . Peripheral vascular catheterization Right 11/18/2015    Procedure: Carotid Angiography;  Surgeon: Conrad Quinlan, MD;  Location: [facility];  Service: Cardiovascular;  Laterality: Right;  . Peripheral vascular catheterization N/A 11/18/2015    Procedure: Aortic Arch Angiography;  Surgeon: Conrad [place], MD;  Location: [facility];  Service: Cardiovascular;  Laterality: N/A;   Family History  Problem Relation Age of Onset  . Heart disease Mother   . Stroke Father 2   Social History:  reports that he quit smoking about 31 years ago. He has never used smokeless tobacco. He reports that he does not drink alcohol or use illicit drugs. Allergies: No Known Allergies Medications Prior to Admission  Medication Sig Dispense Refill  . amLODipine (NORVASC) 10 MG tablet Take 1 tablet (10 mg total) by mouth daily. 90 tablet 1  . aspirin EC 81 MG tablet Take 1 tablet (81  mg total) by mouth daily. 90 tablet 3  . hydrochlorothiazide (HYDRODIURIL) 25 MG tablet Take 1 tablet (25 mg total) by mouth daily. 90 tablet 1  . lisinopril (PRINIVIL,ZESTRIL) 40 MG  tablet TAKE 1 TABLET DAILY 90 tablet 1  . metFORMIN (GLUCOPHAGE) 1000 MG tablet Take 1 tablet (1,000 mg total) by mouth 2 (two) times daily with a meal. 180 tablet 1  . metoprolol succinate (TOPROL-XL) 100 MG 24 hr tablet Take 1 tablet (100 mg total) by mouth daily. Take with or immediately following a meal. 90 tablet 1  . pravastatin (PRAVACHOL) 40 MG tablet Take 40 mg by mouth daily.    Marland Kitchen terazosin (HYTRIN) 1 MG capsule Take 1 capsule (1 mg total) by mouth at bedtime. (Patient not taking: Reported on 09/20/2015) 90 capsule 1    Home: [place] expects to be discharged to:: Private residence Living Arrangements: Spouse/significant other Available Help at Discharge:  (wife works day shift) Type of Home: House Home Access: Stairs to enter Technical brewer of Steps: 1 Entrance Stairs-Rails: None Home Layout: Laundry or work area in basement, One level [employer] Shower/Tub: Gaffer, Charity fundraiser: Associate Professor Accessibility: Yes Home Equipment: None  Lives With: Spouse   Functional History: Prior Function Level of Independence: Independent  Functional Status:  Mobility: Bed Mobility Overal bed mobility: Needs Assistance Bed Mobility: Supine to Sit Supine to sit: Mod assist General bed mobility comments: assist to raise trunk and pivot hips with pad to EOB Transfers Overall transfer level: Needs assistance Equipment used: Rolling walker (2 wheeled) Transfers: Sit to/from Stand, [employer] Transfers Sit to Stand: Mod assist Stand pivot transfers: Mod assist General transfer comment: Cues for hand placement and to rise keeping weight shifted anterior, pt with posterior and L bias Ambulation/Gait Ambulation/Gait assistance: Mod assist Ambulation Distance (Feet): 12 Feet Assistive device: Rolling walker (2 wheeled) Gait Pattern/deviations: Step-through pattern, Decreased stride length, Trendelenburg General Gait Details: Noting continued L  hip and knee buckle, somewhat improved with verbal and tactile cueing to stay tall in hip extension throughout L stance    ADL: ADL Overall ADL's : Needs assistance/impaired Eating/Feeding: Independent, Sitting Eating/Feeding Details (indicate cue type and reason): pt demonstrating ability to open containers on meal tray Grooming: Wash/dry hands, Sitting, Set up Upper Body Bathing: Minimal assitance, Sitting Lower Body Bathing: Maximal assistance, Sit to/from stand Upper Body Dressing : Minimal assistance, Sitting Lower Body Dressing: Maximal assistance, Sit to/from stand Toilet Transfer: Moderate assistance, Stand-pivot, BSC, RW Toileting- Clothing Manipulation and Hygiene: Maximal assistance, Sit to/from stand General ADL Comments: Pt not able to release walker in standing to perform grooming or to stand to pull up pants, poor sitting balance and L LE weakness interfering with ability to don sock.  Cognition: Cognition Overall Cognitive Status: Impaired/Different from baseline Arousal/Alertness: Awake/alert Orientation Level: Oriented X4 Attention: Selective Selective Attention: Appears intact Memory: Impaired Memory Impairment: Storage deficit, Retrieval deficit, Decreased short term memory, Prospective memory Decreased Short Term Memory: Verbal basic Awareness: Impaired Awareness Impairment: Emergent impairment Safety/Judgment: Impaired Cognition Arousal/Alertness: Awake/alert Behavior During Therapy: WFL for tasks assessed/performed Overall Cognitive Status: Impaired/Different from baseline Area of Impairment: Safety/judgement Safety/Judgement: Decreased awareness of safety, Decreased awareness of deficits General Comments: pt with fall attempting OOB by himself  Physical Exam: Blood pressure 128/61, pulse 64, temperature 98.3 F (36.8 C), temperature source Oral, resp. rate 18, height 5' 5"  (1.651 m), weight 76.8 kg (169 lb 5 oz), SpO2 99 %. Physical  Exam Constitutional: He is oriented to  person, place, and time. He appears well-developed.  HENT:  Head: Normocephalic.  Eyes: EOM are normal.  Neck: Normal range of motion. Neck supple. No thyromegaly present.  Cardiovascular:  Cardiac rate control  Respiratory: Effort normal and breath sounds normal. No respiratory distress.  GI: Soft. Bowel sounds are normal. He exhibits no distension.  Musculoskeletal: mild pain left neck with turning to left or bending left.  Neurological: He is alert and oriented to person, place, and time.  Good awareness and insight. Incomplete LLQ field cut appears to persist (inconsistent). Seems to be fairly aware of left side. Mild deficits in short-term and working memory. Follows commands. Moves all extremities. Decreased fine motor skills on left. Mild left limb ataxia. MMT: 5/5 RUE and 4+/5 LUE. RLE: 4+ to 5/5. LLE: 4 to 4/5. Senses pain on left side.  Skin: Skin is warm and dry Psych: mood is pleasant and cooperative.    Results for orders placed or performed during the hospital encounter of 11/18/15 (from the past 48 hour(s))  Glucose, capillary     Status: Abnormal   Collection Time: 11/19/15 12:33 PM  Result Value Ref Range   Glucose-Capillary 195 (H) 65 - 99 mg/dL  Glucose, capillary     Status: Abnormal   Collection Time: 11/19/15  4:23 PM  Result Value Ref Range   Glucose-Capillary 186 (H) 65 - 99 mg/dL  Glucose, capillary     Status: Abnormal   Collection Time: 11/19/15 10:34 PM  Result Value Ref Range   Glucose-Capillary 125 (H) 65 - 99 mg/dL   Comment 1 Notify RN   Glucose, capillary     Status: Abnormal   Collection Time: 11/20/15  6:19 AM  Result Value Ref Range   Glucose-Capillary 154 (H) 65 - 99 mg/dL  Glucose, capillary     Status: Abnormal   Collection Time: 11/20/15 11:30 AM  Result Value Ref Range   Glucose-Capillary 154 (H) 65 - 99 mg/dL   Comment 1 Notify RN   Glucose, capillary     Status: Abnormal   Collection Time:  11/20/15  5:47 PM  Result Value Ref Range   Glucose-Capillary 197 (H) 65 - 99 mg/dL  Glucose, capillary     Status: Abnormal   Collection Time: 11/20/15  9:12 PM  Result Value Ref Range   Glucose-Capillary 150 (H) 65 - 99 mg/dL   Comment 1 Notify RN    Comment 2 Document in Chart   Glucose, capillary     Status: Abnormal   Collection Time: 11/21/15  6:40 AM  Result Value Ref Range   Glucose-Capillary 154 (H) 65 - 99 mg/dL   Mr Brain Wo Contrast  11/19/2015  CLINICAL DATA:  Left upper and lower extremity weakness. New onset prior to discharge. EXAM: MRI HEAD WITHOUT CONTRAST TECHNIQUE: Multiplanar, multiecho pulse sequences of the brain and surrounding structures were obtained without intravenous contrast. COMPARISON:  CT of the head without contrast 11/18/2015 FINDINGS: Multiple scattered foci of restricted diffusion are evident within the precentral gyrus. There is at least 1 focus in the postcentral gyrus. There is also a focus of restricted diffusion measuring just over 2 cm adjacent to the right lateral ventricle. A single punctate focus is present within the left middle frontal gyrus on image 25 series 4. Moderate generalized atrophy is present. The ventricles are proportionate to the degree of atrophy. T2 changes are associated with the areas of restricted diffusion. There is a remote lacunar infarct in the left coronal radiata. Scattered subcortical  T2 changes are present bilaterally. The internal auditory canals are within normal limits bilaterally. The brainstem and cerebellum are unremarkable. The right vertebral artery is occluded. The left internal carotid artery is occluded. Flow is present in the left vertebral artery in the basilar artery. Flow is present in the right internal carotid artery at the skullbase and the left internal carotid artery above the ophthalmic segment. The left lens has been replaced. The globes and orbits are otherwise intact. Mild mucosal thickening is present  throughout the ethmoid air cells and within the frontal sinuses bilaterally. The remaining paranasal sinuses are clear. The mastoid air cells are clear. IMPRESSION: 1. Scattered foci of acute nonhemorrhagic infarction involving the posterior right frontal lobe, the right parietal lobe, in the anterior left frontal lobe. 2. Moderate age advanced atrophy and scattered white matter changes bilaterally compatible with chronic microvascular ischemic disease. 3. Abnormal signal compatible with occlusion in the left internal carotid proximal to the ophthalmic segment and in the distal right vertebral artery. These results were called by telephone at the time of interpretation on 11/19/2015 at 2:24 pm to [place] , who verbally acknowledged these results. Electronically Signed   By: San Morelle M.D.   On: 11/19/2015 14:29       Medical Problem List and Plan: 1.  Left hemiparesthesias with balance deficits mild cognitive impairment secondary to right frontoparietal infarct after carotid angiogram. Follow-up 4 weeks outpatient vascular surgery to consider carotid enterectomy 2.  DVT Prophylaxis/Anticoagulation: Subcutaneous Lovenox. Monitor platelet counts and any signs of bleeding 3. Pain Management: Oxycodone as needed  -kpad for left neck, ROM with therapy 4. Diabetes mellitus with peripheral neuropathy. Glucose 125-197. No recent hemoglobin A1c. Glucophage 1000 mg twice a day. Check blood sugars before meals and at bedtime. Diabetic teaching 5. Neuropsych: This patient is capable of making decisions on his own behalf. 6. Skin/Wound Care: Routine skin checks 7. Fluids/Electrolytes/Nutrition: Routine I&O's with follow-up chemistries 8. Hypertension. Norvasc 10 mg daily, hydrochlorothiazide 25 mg daily, lisinopril 40 mg daily, Toprol-XL 100 mg daily. Monitor with increased activity 9. Diastolic congestive heart failure. Monitor for any signs of fluid overload. Weigh patient daily 10.CRI. Baseline  creatinine 1.40-1.48. Follow-up chemistries 11. Hyperlipidemia. Pravachol   Post Admission Physician Evaluation: 1. Functional deficits secondary  to Left hemiparesthesias with balance deficits mild cognitive impairment secondary to right frontoparietal infarct after carotid angiogram. 2. Patient is admitted to receive collaborative, interdisciplinary care between the physiatrist, rehab nursing staff, and therapy team. 3. Patient's level of medical complexity and substantial therapy needs in context of that medical necessity cannot be provided at a lesser intensity of care such as a SNF. 4. Patient has experienced substantial functional loss from his/her baseline which was documented above under the "Functional History" and "Functional Status" headings.  Judging by the patient's diagnosis, physical exam, and functional history, the patient has potential for functional progress which will result in measurable gains while on inpatient rehab.  These gains will be of substantial and practical use upon discharge  in facilitating mobility and self-care at the household level. 5. Physiatrist will provide 24 hour management of medical needs as well as oversight of the therapy plan/treatment and provide guidance as appropriate regarding the interaction of the two. 6. 24 hour rehab nursing will assist with bladder management, bowel management, safety, skin/wound care, disease management, medication administration, pain management and patient education  and help integrate therapy concepts, techniques,education, etc. 7. PT will assess and treat for/with: pre gait, gait training, endurance ,  safety, equipment, neuromuscular re education.   Goals are: Supervision. 8. OT will assess and treat for/with: ADLs, Cognitive perceptual skills, Neuromuscular re education, safety, endurance, equipment.   Goals are: Supervision. Therapy May proceed with showering this patient. 9. SLP will assess and treat for/with: Memory,  attention, problem solving, sequencing, medication management.  Goals are: Modified independent medication management. 10. Case Management and Social Worker will assess and treat for psychological issues and discharge planning. 11. Team conference will be held weekly to assess progress toward goals and to determine barriers to discharge. 12. Patient will receive at least 3 hours of therapy per day at least 5 days per week. 13. ELOS: 10-14 days       14. Prognosis:  excellent     Meredith Staggers, MD, [facility] 11/22/2015

## 2015-11-22 NOTE — Progress Notes (Signed)
Order received to discharge Pt to [facility].  Telemetry removed and CCMD notified.  IV removed with catheter intact.  Report called to 4W nurse.  Pt stable at discharge.

## 2015-11-22 NOTE — Progress Notes (Signed)
STROKE TEAM PROGRESS NOTE   HISTORY OF PRESENT ILLNESS Evan Stanley is an 75 y.o. male who came for elective cath for carotid disease and stroke history. Was found to have R ICA disease that will need intervention that they were planning on doing at a date sometime soon. As he was being ready to be discharged home, he tried to stand but his L leg was heavy along with his L arm. Code stroke was called. NIHSS 2, tpa not given for low NIHSS. He was last known well 11/18/2015 at 1805. Modified Rankin: Rankin Score=0. He was admitted for further evaluation and treatment.   SUBJECTIVE (INTERVAL HISTORY) His family  is not at the bedside.  He is up in the chair. Patient clearly informs me that he has refused warfarin in the past for atrial fibrillation because his brother had serious problems with the drug.I discussed alternative medications with the patient but he is not interested at the present time and says he will think about it more OBJECTIVE Temp:  [97.6 F (36.4 C)-98.3 F (36.8 C)] 98.3 F (36.8 C) (05/05 1400) Pulse Rate:  [58-64] 58 (05/05 1400) Cardiac Rhythm:  [-] Normal sinus rhythm (05/05 0700) Resp:  [18] 18 (05/05 1400) BP: (112-131)/(48-71) 112/48 mmHg (05/05 1400) SpO2:  [95 %-100 %] 97 % (05/05 1400) Weight:  [171 lb 15.3 oz (78 kg)] 171 lb 15.3 oz (78 kg) (05/05 0409)  CBC:   Recent Labs Lab 11/18/15 1141 11/18/15 2041  WBC  --  9.1  HGB 15.3 14.4  HCT 45.0 42.8  MCV  --  83.1  PLT  --  161    Basic Metabolic Panel:   Recent Labs Lab 11/18/15 1141 11/18/15 2041  NA 140 143  K 4.3 4.3  CL 101 106  CO2  --  26  GLUCOSE 188* 157*  BUN 24* 16  CREATININE 1.40* 1.43*  CALCIUM  --  9.9    Lipid Panel:     Component Value Date/Time   CHOL 176 09/20/2015 0807   TRIG 147 09/20/2015 0807   HDL 37* 09/20/2015 0807   CHOLHDL 4.8 09/20/2015 0807   LDLCALC 110* 09/20/2015 0807   HgbA1c:  Lab Results  Component Value Date   HGBA1C 7.3 09/20/2015   Urine  Drug Screen: No results found for: LABOPIA, COCAINSCRNUR, LABBENZ, AMPHETMU, THCU, LABBARB    IMAGING  No results found.     PHYSICAL EXAM Pleasant elderly obese Caucasian male not in distress. . Afebrile. Head is nontraumatic. Neck is supple with soft right greater than left carotid bruits.    Cardiac exam no murmur or gallop. Lungs are clear to auscultation. Distal pulses are well felt. Neurological Exam ;  Awake  Alert oriented x 3. Normal speech and language.eye movements full without nystagmus.fundi were not visualized. Vision acuity and fields appear normal. Hearing is normal. Palatal movements are normal. Face symmetric. Tongue midline. Normal strength, tone, reflexes and coordination Except mild left hip flexor and ankle dorsiflexor weakness 4/5.Marland Kitchen Normal sensation. Gait deferred. ASSESSMENT/PLAN [name] is a 75 y.o. male with history of atrial fibrillation not on anticoagulation, HTN, HLD, DM, CHF, and carotid stenosis who developed clumsy left arm and leg post cerebral arteriogram. He did not receive IV t-PA due to low NIHSS.   Bi-cerebral infarcts right greater than left status post carotid arteriogram with R ICA > 80% and L ICA occlusion  Resultant  L hemiparesis MRI    Scattered acute infarcts involving posterior right frontal, right  parietal and left anterior frontal lobes.    Cerebral arteriogram right ICA > 80% stenosis, L ICA occluded. R ECA occluded.   2D Echo  pending  LDL 110  HgbA1c 7.3 in March  Lovenox 40 mg sq daily for VTE prophylaxis Diet heart healthy/carb modified Room service appropriate?: Yes; Fluid consistency:: Thin  aspirin 81 mg daily prior to admission, continued in aspirin 81 mg daily . May consider change based on study results.  Ongoing aggressive stroke risk factor management  Therapy recommendations:  CIR. Consult placed  Disposition:  pending   Atrial Fibrillation, not on anticoagulation-patient refused  Carotid  disease  Right Carotid stenosis > 80%  R ECA occluded  L ICA occluded  Treatment per Dr. Bridgett Larsson  Hypertension  Hyperlipidemia  on Pravachol 40 continued in hospital  LDL 110 in March, goal < 70  Continue statin at discharge  Diabetes   HgbA1c 7.3 in March, goal < 7.0  Uncontrolled  Other Stroke Risk Factors  Former Cigarette smoker  Overweight, Body mass index is 28.62 kg/(m^2).   Hx stroke/TIA  Family hx stroke (father)  CHF  Carotid occlusion  Other Active Problems  Lft carotid cocclusion  Hospital day #    I discussed with the patient extensively his atrial fibrillation, stroke risk and need for using an anticoagulant. Patient  Has clearly refused tamoxifen in the past and is unwilling to try  The new DOACs either at the present  Time. Greater than 50% of this time was spent in counseling, explanation of diagnosis, planning of further management, and coordination of care. Stroke team will sign off. Kindly call for questions. Follow-up as an outpatient in stroke clinic in 2 months       Evan Stanley, [place] Pager: [phone] 11/22/2015 2:59 PM    To contact Stroke Continuity provider, please refer to [email or web address] After hours, contact General Neurology

## 2015-11-22 NOTE — Progress Notes (Signed)
Evan Diones, RN Rehab Admission Coordinator Signed Physical Medicine and Rehabilitation PMR Pre-admission 11/20/2015 6:58 PM  Related encounter: Admission (Current) from 11/18/2015 in [place] Collapse All   PMR Admission Coordinator Pre-Admission Assessment  Patient: Evan Stanley is an 75 y.o., male MRN: [id number] DOB: [birth year] Height: 5' 5"  (165.1 cm) Weight: 78 kg (171 lb 15.3 oz)  Insurance Information HMO: PPO: yes PCP: IPA: 80/20: OTHER:  PRIMARY: [employer] [place] Policy#: [id number] Subscriber: pt's wife CM Name: Doroteo Glassman Phone#: [phone] Fax#: [phone] Pre-Cert#: [id number] from 05/05 to 12/04/15 with updates due 12/03/15 Employer:  Benefits: Phone #: [phone] Name: 11/20/15 Eff. Date: 07/21/15 Deduct: $500 Out of Pocket Max: $3500 Life Max: none CIR: 80% SNF: 80% 60 days Outpatient: 80% Co-Pay: 20% 60 visits combined Home Health: 80% Co-Pay: 20% DME: 80% Co-Pay: 20% Providers: in network   Medicaid Application Date: Case Manager:  Disability Application Date: Case Worker:   Emergency Facilities manager Information    Name Relation Home Work Mobile   Mcgroarty,[name] Spouse [phone]       Current Medical History  Patient Admitting Diagnosis: right frontal-parietal infarct  History of Present Illness: Evan Stanley is a 75 y.o. right handed male with history of hypertension, chronic renal insufficiency with baseline creatinine 1.40-1.48 diabetes mellitus, diastolic congestive heart failure, atrial fibrillation maintained on aspirin 81 mg as well as TIA in the past. Patient lives with spouse independent prior to admission. Presented 11/18/2015 with  known carotid artery stenosis diagnosed April 2016 by cardiology services right ICA greater than 80%, left ICA occluded. Underwent elective right carotid angiogram 11/18/2015 per Dr. Bridgett Larsson for workup and plan carotid surgery. Noted post angiogram with left sided weakness. Code stroke was called. CT of the head showed no acute cortically based infarct or acute intracranial abnormality. MRI of the brain showed scattered foci of acute nonhemorrhagic infarct involving the posterior right frontal lobe, right parietal lobe . Neurology follow-up . Patient did not receive TPA.. Echocardiogram with ejection fraction of 55% without PFO.. Currently maintained on aspirin 81 mg daily. Subcutaneous Lovenox for DVT prophylaxis. Plan is to follow-up with vascular surgery 4 weeks outpatient to consider carotid surgery. Regular consistency diet.  NIH Total: 1  Past Medical History  Past Medical History  Diagnosis Date  . Diabetes mellitus without complication ([place])   . Hyperlipidemia   . Hypertension   . CHF (congestive heart failure) ([place])   . Anxiety   . Atrial fibrillation ([place])   . Carotid artery stenosis   . TIA (transient ischemic attack)   . Decreased vision     left eye    Family History  family history includes Heart disease in his mother; Stroke (age of onset: 56) in his father.  Prior Rehab/Hospitalizations:  Has the patient had major surgery during 100 days prior to admission? No  Current Medications   Current facility-administered medications:  . 0.9 % sodium chloride infusion, 1 mL/kg/hr, Intravenous, Continuous, Conrad Leith, MD, Last Rate: 79.8 mL/hr at 11/18/15 1347, 1 mL/kg/hr at 11/18/15 1347 . acetaminophen (TYLENOL) tablet 650 mg, 650 mg, Oral, Q4H PRN, Conrad Ballplay, MD . alum & mag hydroxide-simeth (MAALOX/MYLANTA) 200-200-20 MG/5ML suspension 15-30 mL, 15-30 mL, Oral, Q2H PRN, Rosetta Posner, MD . amLODipine (NORVASC) tablet 10 mg, 10 mg, Oral,  Daily, Rosetta Posner, MD, 10 mg at 11/22/15 0939 . aspirin EC tablet 81 mg, 81 mg, Oral, Daily, Arvilla Meres  Early, MD, 81 mg at 11/22/15 0939 . enoxaparin (LOVENOX) injection 40 mg, 40 mg, Subcutaneous, Q24H, Rosetta Posner, MD, 40 mg at 11/21/15 2117 . guaiFENesin-dextromethorphan (ROBITUSSIN DM) 100-10 MG/5ML syrup 15 mL, 15 mL, Oral, Q4H PRN, Rosetta Posner, MD . hydrALAZINE (APRESOLINE) injection 10 mg, 10 mg, Intravenous, Q2H PRN, Conrad [place], MD . hydrochlorothiazide (HYDRODIURIL) tablet 25 mg, 25 mg, Oral, Daily, Rosetta Posner, MD, 25 mg at 11/22/15 0939 . insulin aspart (novoLOG) injection 0-9 Units, 0-9 Units, Subcutaneous, TID WC, Conrad Washburn, MD, 1 Units at 11/22/15 [phone] . labetalol (NORMODYNE,TRANDATE) injection 10 mg, 10 mg, Intravenous, Q10 min PRN, Rosetta Posner, MD . lisinopril (PRINIVIL,ZESTRIL) tablet 40 mg, 40 mg, Oral, Daily, Rosetta Posner, MD, 40 mg at 11/22/15 0939 . metFORMIN (GLUCOPHAGE) tablet 1,000 mg, 1,000 mg, Oral, BID WC, Conrad [place], MD, 1,000 mg at 11/22/15 0735 . metoprolol (LOPRESSOR) injection 2-5 mg, 2-5 mg, Intravenous, Q2H PRN, Rosetta Posner, MD . metoprolol succinate (TOPROL-XL) 24 hr tablet 100 mg, 100 mg, Oral, Daily, Rosetta Posner, MD, 100 mg at 11/22/15 [phone] . ondansetron (ZOFRAN) injection 4 mg, 4 mg, Intravenous, Q6H PRN, Rosetta Posner, MD, 4 mg at 11/21/15 1809 . oxyCODONE-acetaminophen (PERCOCET/ROXICET) 5-325 MG per tablet 1-2 tablet, 1-2 tablet, Oral, Q4H PRN, Rosetta Posner, MD, 1 tablet at 11/22/15 0735 . pantoprazole (PROTONIX) EC tablet 40 mg, 40 mg, Oral, Daily, Rosetta Posner, MD, 40 mg at 11/22/15 [phone] . phenol (CHLORASEPTIC) mouth spray 1 spray, 1 spray, Mouth/Throat, PRN, Arvilla Meres Early, MD . potassium chloride SA (K-DUR,KLOR-CON) CR tablet 20-40 mEq, 20-40 mEq, Oral, Once, Rosetta Posner, MD, 20 mEq at 11/18/15 2045 . pravastatin (PRAVACHOL) tablet 40 mg, 40 mg, Oral, Daily, Rosetta Posner, MD, 40 mg at 11/22/15 5638  Patients Current Diet: Diet  heart healthy/carb modified Room service appropriate?: Yes; Fluid consistency:: Thin  Precautions / Restrictions Precautions Precautions: Fall Restrictions Weight Bearing Restrictions: No   Has the patient had 2 or more falls or a fall with injury in the past year?No  Prior Activity Level Community (5-7x/wk): I without AD pta. retired. golfs 3 times per week. Eats out daily. Very active and I  Development worker, international aid / [place] Devices/Equipment: CBG Meter Home Equipment: None  Prior Device Use: Indicate devices/aids used by the patient prior to current illness, exacerbation or injury? None of the above  Prior Functional Level Prior Function Level of Independence: Independent  Self Care: Did the patient need help bathing, dressing, using the toilet or eating? Independent  Indoor Mobility: Did the patient need assistance with walking from room to room (with or without device)? Independent  Stairs: Did the patient need assistance with internal or external stairs (with or without device)? Independent  Functional Cognition: Did the patient need help planning regular tasks such as shopping or remembering to take medications? Independent  Current Functional Level Cognition  Arousal/Alertness: Awake/alert Overall Cognitive Status: Impaired/Different from baseline Orientation Level: Oriented X4 Safety/Judgement: Decreased awareness of safety, Decreased awareness of deficits General Comments: pt trying to get to EOB without assistance to use urinal Attention: Selective Selective Attention: Appears intact Memory: Impaired Memory Impairment: Storage deficit, Retrieval deficit, Decreased short term memory, Prospective memory Decreased Short Term Memory: Verbal basic Awareness: Impaired Awareness Impairment: Emergent impairment Safety/Judgment: Impaired   Extremity Assessment (includes Sensation/Coordination)  Upper Extremity Assessment: LUE  deficits/detail LUE Deficits / Details: 4/5 shoulder, 4+/5 elbow to hand  Lower Extremity Assessment: Defer to  PT evaluation LLE Deficits / Details: AROM WFL, strength hip flexion 4/5, knee extension 3+/5, ankle DF 4/5, coordination deficits with toe tapping    ADLs  Overall ADL's : Needs assistance/impaired Eating/Feeding: Independent, Sitting Eating/Feeding Details (indicate cue type and reason): pt demonstrating ability to open containers on meal tray Grooming: Wash/dry hands, Sitting, Set up Upper Body Bathing: Minimal assitance, Sitting Lower Body Bathing: Maximal assistance, Sit to/from stand Upper Body Dressing : Minimal assistance, Sitting Lower Body Dressing: Maximal assistance, Sit to/from stand Toilet Transfer: Moderate assistance, Stand-pivot, BSC, RW Toileting- Clothing Manipulation and Hygiene: Maximal assistance, Sit to/from stand General ADL Comments: Pt not able to release walker in standing to perform grooming or to stand to pull up pants, poor sitting balance and L LE weakness interfering with ability to don sock.    Mobility  Overal bed mobility: Needs Assistance Bed Mobility: Supine to Sit Supine to sit: Mod assist General bed mobility comments: assist to raise trunk and pivot hips with pad to EOB    Transfers  Overall transfer level: Needs assistance Equipment used: Rolling walker (2 wheeled) Transfers: Sit to/from Stand Sit to Stand: Mod assist Stand pivot transfers: Mod assist General transfer comment: VCs for hand placement and assist to bring hips up. Posterior and lt bias.    Ambulation / Gait / Stairs / Wheelchair Mobility  Ambulation/Gait Ambulation/Gait assistance: Mod assist Ambulation Distance (Feet): 50 Feet Assistive device: Rolling walker (2 wheeled) Gait Pattern/deviations: Step-through pattern, Decreased stride length, Trendelenburg, Decreased weight shift to right General Gait Details: Assist to support lt hip and knee to  prevent buckling. Difficulty with placement of lt foot. Gait velocity: decr Gait velocity interpretation: Below normal speed for age/gender    Posture / Balance Dynamic Sitting Balance Sitting balance - Comments: Pt using UE's for support. Supervision for safety. Balance Overall balance assessment: Needs assistance Sitting-balance support: Feet supported, Bilateral upper extremity supported Sitting balance-Leahy Scale: Poor Sitting balance - Comments: Pt using UE's for support. Supervision for safety. Postural control: Left lateral lean, Posterior lean Standing balance support: Bilateral upper extremity supported Standing balance-Leahy Scale: Poor Standing balance comment: Walker and mod A    Special needs/care consideration Skin intact  Bowel mgmt: continent Bladder mgmt: continent Diabetic mgmt Hgb A1c 7.3   Previous Home Environment Living Arrangements: Spouse/significant other Lives With: Spouse Available Help at Discharge: (wife works day shift) Type of Home: House Home Layout: Laundry or work area in basement, One level Home Access: Stairs to enter Entrance Stairs-Rails: None Technical brewer of Steps: 1 Bathroom Shower/Tub: Gaffer, Charity fundraiser: Standard Bathroom Accessibility: Yes How Accessible: Accessible via walker [place]: No  Discharge Living Setting Plans for Discharge Living Setting: Patient's home, Lives with (comment) (spouse) Type of Home at Discharge: House Discharge Home Layout: One level, Laundry or work area in basement Discharge Home Access: Stairs to enter Entrance Stairs-Rails: None Technical brewer of Steps: 1 Discharge Bathroom Shower/Tub: Gaffer, Door Discharge Bathroom Toilet: Standard Discharge Bathroom Accessibility: Yes How Accessible: Accessible via walker Does the patient have any problems obtaining your medications?: No  Social/Family/Support  Systems Patient Roles: Spouse, Parent Contact Information: V[place] Groft Anticipated Caregiver: wife works days. May be able to take off but only very short time Anticipated Caregiver's Contact Information: see above Ability/Limitations of Caregiver: wife works 7-3 daily weekdays Caregiver Availability: Evenings only Discharge Plan Discussed with Primary Caregiver: Yes Is Caregiver In Agreement with Plan?: Yes Does Caregiver/Family have Issues with Lodging/Transportation while Pt is in  Rehab?: No  Goals/Additional Needs Patient/Family Goal for Rehab: Mod I with PT and OT Expected length of stay: ELOS 7-9 days Special Service Needs: CEA planned in future after rehab stay.TBD Pt/Family Agrees to Admission and willing to participate: Yes Program Orientation Provided & Reviewed with Pt/Caregiver Including Roles & Responsibilities: Yes  Decrease burden of Care through IP rehab admission: n/a  Possible need for SNF placement upon discharge: not antiociapted  Patient Condition: This patient's medical and functional status has changed since the consult dated: 11/19/2015 in which the Rehabilitation Physician determined and documented that the patient's condition is appropriate for intensive rehabilitative care in an inpatient rehabilitation facility. See "History of Present Illness" (above) for medical update. Functional changes are: Min to mod assist. Patient's medical and functional status update has been discussed with the Rehabilitation physician and patient remains appropriate for inpatient rehabilitation. Will admit to inpatient rehab today.  Preadmission Screen Completed By: Evan Stanley, 11/22/2015 11:13 AM ______________________________________________________________________  Discussed status with Dr. Naaman Plummer on 11/22/15 at 1112 and received telephone approval for admission today.  Admission Coordinator: Evan Stanley, time 1112/Date05/05/17          Cosigned by: Meredith Staggers, MD at 11/22/2015 11:41 AM  Revision History     Date/Time User Provider Type Action   11/22/2015 11:41 AM Meredith Staggers, MD Physician Cosign   11/22/2015 11:13 AM Evan Diones, RN Rehab Admission Coordinator Sign   11/21/2015 9:59 PM Cristina Gong, RN Rehab Admission Coordinator Share   11/21/2015 11:13 AM Cristina Gong, RN Rehab Admission Coordinator Share   View Details Report

## 2015-11-22 NOTE — Consult Note (Signed)
NAMEBENJERMIN, KORBER NO.:  [id number]  MEDICAL RECORD NO.:  [id number]  LOCATION:  2W24C                        FACILITY:  [facility]  PHYSICIAN:  Jody Aguinaga K. Taiylor Virden, M.D.DATE OF BIRTH:  [birth year]  DATE OF CONSULTATION: DATE OF DISCHARGE:                                CONSULTATION   CLINICAL HISTORY:   Patient with right carotid stenosis.  EXAMINATION:  Intracranial interpretation of right common carotid arteriogram.  FINDINGS:    Only an AP projection of the intracranial portion of the right common carotid arteriogram  is present for  interpretation.  This demonstrates wide patency of the petrous, cavernous,  Supraclinoid segments of  the  right internal carotid artery.  The right middle cerebral artery demonstrates approximately 50% narrowing of the mid M1 segment.  Distally, the right MCA branches  opacified normally.  The right anterior cerebral artery opacifies  normally into the capillary and venous phases.    Cross opacification of the left anterior cerebral artery A-2 segment is seen via the anterior communicating artery.   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  IMPRESSION:  Only AP projection of the intracranial compartment Presented for interpretation .    There is a suggestion of approximately 50% stenosis of the right middle cerebral artery M1 segment.  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        ______________________________ Fritz Pickerel Estanislado Pandy, M.D.     SKD/MEDQ  D:  11/21/2015  T:  11/22/2015  Job:  [id number]

## 2015-11-22 NOTE — Progress Notes (Signed)
   Daily Progress Note  Assessment/Planning: POD #4 s/p Dx R carotid angiogram complicated by CVA   Walking improved  Motor improved and sym  Awaiting insurance approval for CIR  Re-evaluate in office R CEA in 4 weeks  Subjective  - 4 Days Post-Op  Walking bet  Objective Filed Vitals:   11/21/15 0646 11/21/15 1354 11/21/15 2020 11/22/15 0409  BP: 128/61 120/63 131/71 118/60  Pulse: 64 63 63 64  Temp: 98.3 F (36.8 C) 98.5 F (36.9 C) 97.6 F (36.4 C) 98.3 F (36.8 C)  TempSrc: Oral Oral Oral Oral  Resp: 18 17 18 18   Height:      Weight:    171 lb 15.3 oz (78 kg)  SpO2: 99% 99% 100% 95%    Intake/Output Summary (Last 24 hours) at 11/22/15 0827 Last data filed at 11/22/15 0411  Gross per 24 hour  Intake    480 ml  Output    500 ml  Net    -20 ml    PULM  CTAB CV  RRR GI  soft, NTND NEURO CN intact, MS 5/5 sym  Laboratory CBC    Component Value Date/Time   WBC 9.1 11/18/2015 2041   WBC 8.7 04/16/2014 1002   HGB 14.4 11/18/2015 2041   HGB 15.0 04/16/2014 1002   HCT 42.8 11/18/2015 2041   HCT 45.2 04/16/2014 1002   PLT 211 11/18/2015 2041    BMET    Component Value Date/Time   NA 143 11/18/2015 2041   NA 141 09/20/2015 0807   K 4.3 11/18/2015 2041   CL 106 11/18/2015 2041   CO2 26 11/18/2015 2041   GLUCOSE 157* 11/18/2015 2041   GLUCOSE 195* 09/20/2015 0807   BUN 16 11/18/2015 2041   BUN 12 09/20/2015 0807   CREATININE 1.43* 11/18/2015 2041   CALCIUM 9.9 11/18/2015 2041   GFRNONAA 47* 11/18/2015 2041   GFRAA 55* 11/18/2015 2041    [name], MD [facility] Office: [phone] Pager: [phone]  11/22/2015, 8:27 AM

## 2015-11-22 NOTE — Clinical Social Work Note (Signed)
CSW received notification from [facility] that patient was accepted to inpatient rehab. CSW signing off.   [employer], LCSW [phone]

## 2015-11-23 ENCOUNTER — Inpatient Hospital Stay (HOSPITAL_COMMUNITY): Payer: BLUE CROSS/BLUE SHIELD | Admitting: Speech Pathology

## 2015-11-23 ENCOUNTER — Inpatient Hospital Stay (HOSPITAL_COMMUNITY): Payer: BLUE CROSS/BLUE SHIELD | Admitting: Occupational Therapy

## 2015-11-23 ENCOUNTER — Inpatient Hospital Stay (HOSPITAL_COMMUNITY): Payer: Medicare Other | Admitting: Physical Therapy

## 2015-11-23 DIAGNOSIS — N289 Disorder of kidney and ureter, unspecified: Secondary | ICD-10-CM | POA: Insufficient documentation

## 2015-11-23 DIAGNOSIS — E871 Hypo-osmolality and hyponatremia: Secondary | ICD-10-CM

## 2015-11-23 DIAGNOSIS — I10 Essential (primary) hypertension: Secondary | ICD-10-CM

## 2015-11-23 DIAGNOSIS — N189 Chronic kidney disease, unspecified: Secondary | ICD-10-CM | POA: Insufficient documentation

## 2015-11-23 DIAGNOSIS — N179 Acute kidney failure, unspecified: Secondary | ICD-10-CM | POA: Insufficient documentation

## 2015-11-23 DIAGNOSIS — D72829 Elevated white blood cell count, unspecified: Secondary | ICD-10-CM | POA: Insufficient documentation

## 2015-11-23 DIAGNOSIS — E1142 Type 2 diabetes mellitus with diabetic polyneuropathy: Secondary | ICD-10-CM | POA: Insufficient documentation

## 2015-11-23 DIAGNOSIS — N183 Chronic kidney disease, stage 3 (moderate): Secondary | ICD-10-CM

## 2015-11-23 LAB — GLUCOSE, CAPILLARY
GLUCOSE-CAPILLARY: 161 mg/dL — AB (ref 65–99)
GLUCOSE-CAPILLARY: 138 mg/dL — AB (ref 65–99)
Glucose-Capillary: 170 mg/dL — ABNORMAL HIGH (ref 65–99)
Glucose-Capillary: 128 mg/dL — ABNORMAL HIGH (ref 65–99)

## 2015-11-23 MED ORDER — ENOXAPARIN SODIUM 40 MG/0.4ML ~~LOC~~ SOLN
40.0000 mg | SUBCUTANEOUS | Status: DC
  Administered 2015-11-23 – 2015-12-04 (×12): 40 mg via SUBCUTANEOUS
  Filled 2015-11-23 (×12): qty 0.4

## 2015-11-23 NOTE — IPOC Note (Addendum)
Overall Plan of Care [facility]) Patient Details Name: Evan Stanley MRN: [id number] DOB: 1940-08-07  Admitting Diagnosis: CVA  Hospital Problems: Active Problems:   CVA (cerebral infarction)   Hyponatremia   Chronic renal insufficiency   AKI (acute kidney injury) ([place])   Leukocytosis   Type 2 diabetes mellitus with peripheral neuropathy ([place])     Functional Problem List: Nursing Bladder, Endurance, Edema, Medication Management, Motor, Nutrition, Pain, Safety, Skin Integrity  PT Balance, Behavior, Motor, Pain, Perception, Safety, Sensory  OT Balance, Cognition, Endurance, Motor, Pain, Perception, Safety, Sensory, Vision  SLP Cognition  TR         Basic ADL's: OT Grooming, Bathing, Dressing, Toileting     Advanced  ADL's: OT Simple Meal Preparation     Transfers: PT Bed Mobility, Bed to Chair, Car, Manufacturing systems engineer, Metallurgist: PT Ambulation, Emergency planning/management officer, Stairs     Additional Impairments: OT Fuctional Use of Upper Extremity  SLP        TR      Anticipated Outcomes Item Anticipated Outcome  Self Feeding Mod I  Swallowing      Basic self-care  Mod I  Toileting  Mod I   Bathroom Transfers Mod I  Bowel/Bladder  Pt will manage bowel and bladder with mod I assist   Transfers  Mod I With LRAD  Locomotion  Supervision with LRAD .  Communication     Cognition  Min A  Pain  Pt will manage pain at 4 or less on a scale of 0-10.   Safety/Judgment  Pt will remain free of falls and injury with min assist    Therapy Plan: PT Intensity: Minimum of 1-2 x/day ,45 to 90 minutes PT Frequency: 5 out of 7 days PT Duration Estimated Length of Stay: 9-14days  OT Intensity: Minimum of 1-2 x/day, 45 to 90 minutes OT Frequency: 5 out of 7 days OT Duration/Estimated Length of Stay: 10-12 days SLP Intensity: Minumum of 1-2 x/day, 30 to 90 minutes SLP Frequency: 3 to 5 out of 7 days SLP Duration/Estimated Length of Stay: 10 to 12 days       Team  Interventions: Nursing Interventions    PT interventions Ambulation/gait training, Training and development officer, Cognitive remediation/compensation, Community reintegration, Discharge planning, Disease management/prevention, DME/adaptive equipment instruction, Functional mobility training, Neuromuscular re-education, Pain management, Patient/family education, Psychosocial support, Skin care/wound management, Stair training, Splinting/orthotics, Therapeutic Activities, Therapeutic Exercise, UE/LE Strength taining/ROM, UE/LE Coordination activities, Visual/perceptual remediation/compensation, Wheelchair propulsion/positioning  OT Interventions Training and development officer, Cognitive remediation/compensation, Academic librarian, Discharge planning, Disease mangement/prevention, Engineer, drilling, Functional mobility training, Neuromuscular re-education, Pain management, Patient/family education, Psychosocial support, Self Care/advanced ADL retraining, Therapeutic Activities, Therapeutic Exercise, UE/LE Strength taining/ROM, UE/LE Coordination activities, Visual/perceptual remediation/compensation  SLP Interventions Cognitive remediation/compensation, Internal/external aids, Medication managment, Patient/family education, Functional tasks, Environmental controls  TR Interventions    SW/CM Interventions Discharge Planning, Psychosocial Support, Patient/Family Education    Team Discharge Planning: Destination: PT-Home ,OT- Home , SLP-Home Projected Follow-up: PT-Home health PT, OT-  Outpatient OT, SLP-Home Health SLP Projected Equipment Needs: PT-Rolling walker with 5" wheels, Wheelchair (measurements), Wheelchair cushion (measurements), OT- Tub/shower seat, SLP-None recommended by SLP Equipment Details: PT- , OT-  Patient/family involved in discharge planning: PT- Patient,  OT-Patient, SLP-Patient  MD ELOS: 10-14 days. Medical Rehab Prognosis:  Good Assessment: 75 y.o. right handed  male with history of hypertension, chronic renal insufficiency with baseline creatinine 1.40-1.48 diabetes mellitus, diastolic congestive heart failure, atrial fibrillation maintained on aspirin 81 mg as  well as TIA in the past.  Wife works during the day. Presented 11/18/2015 with known carotid artery stenosis diagnosed April 2016 by cardiology services right ICA greater than 80%, left ICA occluded. Underwent elective right carotid angiogram 11/18/2015 per Dr. Bridgett Larsson for workup and plan carotid surgery. Noted post angiogram with left sided weakness. Code stroke was called. CT of the head showed no acute cortically based infarct or acute intracranial abnormality. MRI of the brain showed scattered foci of acute nonhemorrhagic infarct involving the posterior right frontal lobe, right parietal lobe . Neurology has been following-up with the patient. Patient did not receive TPA.. Echocardiogram with ejection fraction of 55% without PFO. Currently maintained on aspirin 81 mg daily. Subcutaneous Plan is to follow-up with vascular surgery 4 weeks outpatient to consider carotid surgery. Patient with deficits in balance and cognition. Will set goals for mod I with PT and OT, min assist with SLP.  See Team Conference Notes for weekly updates to the plan of care

## 2015-11-23 NOTE — Evaluation (Signed)
Speech Language Pathology Assessment and Plan  Patient Details  Name: Evan Stanley MRN: [id number] Date of Birth: [birth year]  SLP Diagnosis: Cognitive Impairments  Rehab Potential: Good ELOS: 10 to 12 days    Today's Date: 11/23/2015 SLP Individual Time: 0930-1030 SLP Individual Time Calculation (min): 60 min   Problem List:  Patient Active Problem List   Diagnosis Date Noted  . CVA (cerebral infarction) 11/22/2015  . Acute CVA (cerebrovascular accident) ([place]) 11/19/2015  . Carotid artery disease ([place]) 11/18/2015  . Carotid artery occlusion without infarction 10/26/2014  . Carotid stenosis 10/26/2014  . Status post aortic valve replacement with bioprosthetic valve 07/05/2013  . Hyperlipidemia 07/05/2013  . Essential hypertension 07/05/2013  . CAD (coronary artery disease)   . DM (diabetes mellitus) ([place])    Past Medical History:  Past Medical History  Diagnosis Date  . Diabetes mellitus without complication ([place])   . Hyperlipidemia   . Hypertension   . CHF (congestive heart failure) ([place])   . Anxiety   . Atrial fibrillation ([place])   . Carotid artery stenosis   . TIA (transient ischemic attack)   . Decreased vision     left eye   Past Surgical History:  Past Surgical History  Procedure Laterality Date  . Cardiac valve replacement    . Aortic valve replacement    . Back surgery    . Laser surgery, left eye  Left 10-29-2015     retinal surgery/ Deloria Lair MD   . Peripheral vascular catheterization Right 11/18/2015    Procedure: Carotid Angiography;  Surgeon: Conrad Conroy, MD;  Location: [facility];  Service: Cardiovascular;  Laterality: Right;  . Peripheral vascular catheterization N/A 11/18/2015    Procedure: Aortic Arch Angiography;  Surgeon: Conrad [place], MD;  Location: [facility];  Service: Cardiovascular;  Laterality: N/A;    Assessment / Plan / Recommendation Clinical Impression Evan Stanley is a 75 y.o. male who came for elective cath for carotid  disease and stroke history. Patient was found to have R ICA disease that will need intervention. As he was being ready to be discharged home, he tried to stand but his L leg was heavy along with his L arm. Code stroke was called. NIHSS 2, tpa not given for low NIHSS. He was last known well 11/18/2015 at 1805. Modified Rankin: Rankin Score=0. He was admitted for further evaluation and treatment. MRI of the brain showed scattered foci of acute nonhemorrhagic infarct involving the posterior right frontal lobe, right parietal lobe. CIR recommended for follow up therapy and patient was admitted on 11/22/2015. Patient demonstrates mild to moderate cognitive linguistic deficits impacting complex problem solving, recall, abstract reasoning and awareness of deficits. Patient was given the MoCA (Version 7.2) and obtained a score of 19 out of 30 with 22 considered an average score. Patient continues to tolerate a regular diet with thin liquids and despite some mild left facial weakness, his speech is completely intelligible at the conversation level. Patient would benefit from skilled SLP intervention to maximize cognitive function in order to maximize his overall functional independence prior to discharge.   Skilled Therapeutic Interventions          Administered a cognitive-linguistic evaluation. Pleas see above for details. Skilled intervention also focused on complex problem solving. Patient requires Mod A verbal cues for completion of task. Education provided to patient on current cognitive function and goals of skilled SLP intervention. He verbalized understanding and agreement.    SLP Assessment  Patient will need skilled Speech Lanaguage Pathology Services during CIR admission    Recommendations  SLP Diet Recommendations: Age appropriate regular solids;Thin Liquid Administration via: Straw Medication Administration: Whole meds with liquid Supervision: Patient able to self feed Postural Changes and/or Swallow  Maneuvers: Seated upright 90 degrees Oral Care Recommendations: Oral care BID Patient destination: Home Follow up Recommendations: Home Health SLP Equipment Recommended: None recommended by SLP    SLP Frequency 3 to 5 out of 7 days   SLP Duration  SLP Intensity  SLP Treatment/Interventions 10 to 12 days  Minumum of 1-2 x/day, 30 to 90 minutes  Cognitive remediation/compensation;Internal/external aids;Medication managment;Patient/family education;Functional tasks;Environmental controls    Pain Pain Assessment Pain Assessment: 0-10 Pain Score: 6  Pain Type: Acute pain Pain Location: Neck Pain Orientation: Left Pain Descriptors / Indicators: Aching Pain Onset: Gradual Pain Intervention(s): Medication (See eMAR)  Prior Functioning Cognitive/Linguistic Baseline: Within functional limits Type of Home: House  Lives With: Spouse Available Help at Discharge: Family;Friend(s) Education: Finished the 11th grade in high school Vocation: Retired  Function:  Cognition Comprehension Comprehension assist level: Understands complex 90% of the time/cues 10% of the time  Expression   Expression assist level: Expresses complex 90% of the time/cues < 10% of the time  Technical sales engineer Social Interaction assist level: Interacts appropriately 90% of the time - Needs monitoring or encouragement for participation or interaction.  Problem Solving Problem solving assist level: Solves basic 90% of the time/requires cueing < 10% of the time  Memory Memory assist level: Recognizes or recalls 25 - 49% of the time/requires cueing 50 - 75% of the time   Short Term Goals: Week 1: SLP Short Term Goal 1 (Week 1): Patient will complete complex problem solving for mildly complex tasks with Mod A verbal cues and 90% accuracy.  SLP Short Term Goal 2 (Week 1): Patient will demonstrate anticipatory awareness and identify 3 tasks that he can partiicpate in at home safely with Mod A verbal cues.  SLP Short  Term Goal 3 (Week 1): Patient will utilize external memory aids to recall new, daily information with Mod A verbal cues.  SLP Short Term Goal 4 (Week 1): Patient will complete abstract reasoning with Mod A verbal cues and 90% accuracy.   Refer to Care Plan for Long Term Goals  Recommendations for other services: None  Discharge Criteria: Patient will be discharged from SLP if patient refuses treatment 3 consecutive times without medical reason, if treatment goals not met, if there is a change in medical status, if patient makes no progress towards goals or if patient is discharged from hospital.  The above assessment, treatment plan, treatment alternatives and goals were discussed and mutually agreed upon: by patient  Monee Dembeck 11/23/2015, 11:36 AM

## 2015-11-23 NOTE — Evaluation (Signed)
Occupational Therapy Assessment and Plan  Patient Details  Name: Evan Stanley MRN: [id number] Date of Birth: [birth year]  OT Diagnosis: acute pain, ataxia, blindness and low vision, cognitive deficits, hemiplegia affecting non-dominant side and muscle weakness (generalized) Rehab Potential: Rehab Potential (ACUTE ONLY): Good ELOS: 10-12 days   Today's Date: 11/23/2015 OT Individual Time: 5035-4656 OT Individual Time Calculation (min): 60 min     Problem List:  Patient Active Problem List   Diagnosis Date Noted  . CVA (cerebral infarction) 11/22/2015  . Acute CVA (cerebrovascular accident) ([place]) 11/19/2015  . Carotid artery disease ([place]) 11/18/2015  . Carotid artery occlusion without infarction 10/26/2014  . Carotid stenosis 10/26/2014  . Status post aortic valve replacement with bioprosthetic valve 07/05/2013  . Hyperlipidemia 07/05/2013  . Essential hypertension 07/05/2013  . CAD (coronary artery disease)   . DM (diabetes mellitus) ([place])     Past Medical History:  Past Medical History  Diagnosis Date  . Diabetes mellitus without complication ([place])   . Hyperlipidemia   . Hypertension   . CHF (congestive heart failure) ([place])   . Anxiety   . Atrial fibrillation ([place])   . Carotid artery stenosis   . TIA (transient ischemic attack)   . Decreased vision     left eye   Past Surgical History:  Past Surgical History  Procedure Laterality Date  . Cardiac valve replacement    . Aortic valve replacement    . Back surgery    . Laser surgery, left eye  Left 10-29-2015     retinal surgery/ Deloria Lair MD   . Peripheral vascular catheterization Right 11/18/2015    Procedure: Carotid Angiography;  Surgeon: Conrad [place], MD;  Location: [facility];  Service: Cardiovascular;  Laterality: Right;  . Peripheral vascular catheterization N/A 11/18/2015    Procedure: Aortic Arch Angiography;  Surgeon: Conrad [place], MD;  Location: [facility];  Service: Cardiovascular;  Laterality:  N/A;    Assessment & Plan Clinical Impression: Patient is a 75 y.o. right handed male with history of hypertension, chronic renal insufficiency with baseline creatinine 1.40-1.48 diabetes mellitus, diastolic congestive heart failure, atrial fibrillation maintained on aspirin 81 mg as well as TIA in the past. Patient lives with spouse independent prior to admission. One level home. Wife works during the day. Presented 11/18/2015 with known carotid artery stenosis diagnosed April 2016 by cardiology services right ICA greater than 80%, left ICA occluded. Underwent elective right carotid angiogram 11/18/2015 per Dr. Bridgett Larsson for workup and plan carotid surgery. Noted post angiogram with left sided weakness. Code stroke was called. CT of the head showed no acute cortically based infarct or acute intracranial abnormality. MRI of the brain showed scattered foci of acute nonhemorrhagic infarct involving the posterior right frontal lobe, right parietal lobe . Neurology has been following-up with the patient. Patient did not receive TPA.. Echocardiogram with ejection fraction of 55% without PFO.. Currently maintained on aspirin 81 mg daily. Subcutaneous Lovenox for DVT prophylaxis. Plan is to follow-up with vascular surgery 4 weeks outpatient to consider carotid surgery. Regular consistency diet. Physical and occupational therapy evaluations completed with recommendations of physical medicine rehabilitation consult.   Patient transferred to CIR on 11/22/2015 .    Patient currently requires mod with basic self-care skills secondary to muscle weakness, impaired timing and sequencing, abnormal tone, unbalanced muscle activation, ataxia and decreased coordination and decreased sitting balance, decreased standing balance, decreased postural control and decreased balance strategies.  Prior to hospitalization, patient could complete ADLs  with independent .  Patient will benefit from skilled intervention to increase independence  with basic self-care skills and increase level of independence with iADL prior to discharge home with care partner.  Anticipate patient will require intermittent supervision and follow up outpatient.  OT - End of Session Activity Tolerance: Tolerates 30+ min activity without fatigue Endurance Deficit: No OT Assessment Rehab Potential (ACUTE ONLY): Good Barriers to Discharge: Decreased caregiver support Barriers to Discharge Comments: wife works day shift OT Patient demonstrates impairments in the following area(s): Balance;Cognition;Endurance;Motor;Pain;Perception;Safety;Sensory;Vision OT Basic ADL's Functional Problem(s): Grooming;Bathing;Dressing;Toileting OT Advanced ADL's Functional Problem(s): Simple Meal Preparation OT Transfers Functional Problem(s): Toilet;Tub/Shower OT Additional Impairment(s): Fuctional Use of Upper Extremity OT Plan OT Intensity: Minimum of 1-2 x/day, 45 to 90 minutes OT Frequency: 5 out of 7 days OT Duration/Estimated Length of Stay: 10-12 days OT Treatment/Interventions: Balance/vestibular training;Cognitive remediation/compensation;Community reintegration;Discharge planning;Disease mangement/prevention;DME/adaptive equipment instruction;Functional mobility training;Neuromuscular re-education;Pain management;Patient/family education;Psychosocial support;Self Care/advanced ADL retraining;Therapeutic Activities;Therapeutic Exercise;UE/LE Strength taining/ROM;UE/LE Coordination activities;Visual/perceptual remediation/compensation OT Self Feeding Anticipated Outcome(s): Mod I OT Basic Self-Care Anticipated Outcome(s): Mod I OT Toileting Anticipated Outcome(s): Mod I OT Bathroom Transfers Anticipated Outcome(s): Mod I OT Recommendation Patient destination: Home Follow Up Recommendations: Outpatient OT Equipment Recommended: Tub/shower seat   Skilled Therapeutic Intervention OT eval completed with discussion of rehab process, OT purpose, POC, ELOS, and goals.   ADL assessment completed with functional transfers and bathing and dressing at sit > stand level.  Pt ambulated to room shower with RW and mod assist, noted instability in LLE.  Bathing completed at sit > stand level in room shower with mod assist for standing balance, pt with decreased ability to maintain upright standing without BUE support therefore requiring increased assist for standing balance and/or therapist to complete tasks while pt in standing.  Dressing completed again with mod assist for standing balance and assist with pulling pants over hips.  Pt reports low vision in Lt eye at baseline with no change, did not appear to impact his ability to complete ADL tasks during this session.  OT Evaluation Precautions/Restrictions  Precautions Precautions: Fall General   Vital Signs Therapy Vitals Temp: 98.1 F (36.7 C) Temp Source: Oral Pulse Rate: 70 Resp: 17 BP: (!) 121/58 mmHg Patient Position (if appropriate): Lying Oxygen Therapy SpO2: 96 % O2 Device: Not Delivered Pain Pain Assessment Pain Assessment: 0-10 Pain Score: 8  Pain Type: Acute pain Pain Location: Neck Pain Orientation: Left Pain Descriptors / Indicators: Aching Pain Onset: Gradual Pain Intervention(s): Medication (See eMAR) Home Living/Prior Functioning Home Living Family/patient expects to be discharged to:: Private residence Living Arrangements: Spouse/significant other Available Help at Discharge: Available PRN/intermittently (wife works day shift) Type of Home: House Home Access: Stairs to enter Technical brewer of Steps: 1 Entrance Stairs-Rails: None Home Layout: Laundry or work area in basement, One level [employer] Shower/Tub: Gaffer, Charity fundraiser: Associate Professor Accessibility: Yes  Lives With: Spouse IADL History Homemaking Responsibilities: Yes Meal Prep Responsibility: No Laundry Responsibility: Secondary Cleaning Responsibility: No Prior Function Level of  Independence: Independent with basic ADLs, Independent with gait  Able to Take Stairs?: Yes Driving: Yes Vocation: Retired Land) Leisure: Hobbies-yes (Comment) (plays golf 3x/week) ADL  See Function Navigator Vision/Perception  Vision- History Baseline Vision/History: Wears glasses (Pt with low vision in Lt eye, has had multiple procedures to attempt to save vision) Wears Glasses: Reading only Patient Visual Report: No change from baseline Vision- Assessment Vision Assessment?: Yes;Vision impaired- to be further tested in functional context Eye Alignment: Within Functional Limits  Ocular Range of Motion: Within Functional Limits Alignment/Gaze Preference: Within Defined Limits Tracking/Visual Pursuits: Able to track stimulus in all quads without difficulty Saccades: Within functional limits Visual Fields: Left superior homonymous quadranopsia;Impaired-to be further tested in functional context (impaired vision in Lt hemisphere, worse in upper quadrance than lower) Additional Comments: Pt with low vision in Lt eye, per pt "had a stroke in that eye", has had multiple procedures to in attempt to save vision  Cognition Overall Cognitive Status: Impaired/Different from baseline Arousal/Alertness: Awake/alert Orientation Level: Person;Place;Situation Person: Oriented Place: Oriented Situation: Oriented Year: 2017 Month: May Day of Week: Correct Memory: Impaired Memory Impairment: Storage deficit;Retrieval deficit;Decreased short term memory Immediate Memory Recall: Sock;Blue;Bed Memory Recall: Bed;Blue;Sock Memory Recall Sock: With Cue Memory Recall Blue: Without Cue Memory Recall Bed: Without Cue Attention: Selective Selective Attention: Appears intact Awareness: Impaired Awareness Impairment: Emergent impairment Problem Solving: Appears intact Safety/Judgment: Impaired Sensation Sensation Light Touch: Impaired by gross assessment (mild impairment in sensation in Lt  fingers) Stereognosis: Not tested Hot/Cold: Appears Intact Proprioception: Appears Intact Coordination Gross Motor Movements are Fluid and Coordinated: No Fine Motor Movements are Fluid and Coordinated: No Finger Nose Finger Test: WFL on Rt, mild decrease in speed and accuracy on Lt Extremity/Trunk Assessment RUE Assessment RUE Assessment: Within Functional Limits LUE Assessment LUE Assessment: Exceptions to WFL (ROM WFL, mild decrease in shoulder full range due to pain in neck and shoulder, strength grossly 4/5)   See Function Navigator for Current Functional Status.   Refer to Care Plan for Long Term Goals  Recommendations for other services: None  Discharge Criteria: Patient will be discharged from OT if patient refuses treatment 3 consecutive times without medical reason, if treatment goals not met, if there is a change in medical status, if patient makes no progress towards goals or if patient is discharged from hospital.  The above assessment, treatment plan, treatment alternatives and goals were discussed and mutually agreed upon: by patient  Simonne Come 11/23/2015, 10:27 AM

## 2015-11-23 NOTE — Evaluation (Signed)
Physical Therapy Assessment and Plan  Patient Details  Name: Evan Stanley MRN: [id number] Date of Birth: [birth year]  PT Diagnosis: Abnormal posture, Abnormality of gait, Difficulty walking, Hemiparesis non-dominant, Impaired cognition, Impaired sensation and Muscle weakness Rehab Potential: Good ELOS: 9-14days    Today's Date: 11/23/2015 PT Individual Time: 2633-3545 PT Individual Time Calculation (min): 75 min    Problem List:  Patient Active Problem List   Diagnosis Date Noted  . CVA (cerebral infarction) 11/22/2015  . Acute CVA (cerebrovascular accident) ([place]) 11/19/2015  . Carotid artery disease ([place]) 11/18/2015  . Carotid artery occlusion without infarction 10/26/2014  . Carotid stenosis 10/26/2014  . Status post aortic valve replacement with bioprosthetic valve 07/05/2013  . Hyperlipidemia 07/05/2013  . Essential hypertension 07/05/2013  . CAD (coronary artery disease)   . DM (diabetes mellitus) ([place])     Past Medical History:  Past Medical History  Diagnosis Date  . Diabetes mellitus without complication ([place])   . Hyperlipidemia   . Hypertension   . CHF (congestive heart failure) ([place])   . Anxiety   . Atrial fibrillation ([place])   . Carotid artery stenosis   . TIA (transient ischemic attack)   . Decreased vision     left eye   Past Surgical History:  Past Surgical History  Procedure Laterality Date  . Cardiac valve replacement    . Aortic valve replacement    . Back surgery    . Laser surgery, left eye  Left 10-29-2015     retinal surgery/ Deloria Lair MD   . Peripheral vascular catheterization Right 11/18/2015    Procedure: Carotid Angiography;  Surgeon: Conrad Guthrie, MD;  Location: [facility];  Service: Cardiovascular;  Laterality: Right;  . Peripheral vascular catheterization N/A 11/18/2015    Procedure: Aortic Arch Angiography;  Surgeon: Conrad [place], MD;  Location: [facility];  Service: Cardiovascular;  Laterality: N/A;    Assessment &  Plan Clinical Impression: Patient is a20 y.o. right handed male with history of hypertension, chronic renal insufficiency with baseline creatinine 1.40-1.48 diabetes mellitus, diastolic congestive heart failure, atrial fibrillation maintained on aspirin 81 mg as well as TIA in the past. Patient lives with spouse independent prior to admission. One level home. Wife works during the day. Presented 11/18/2015 with known carotid artery stenosis diagnosed April 2016 by cardiology services right ICA greater than 80%, left ICA occluded. Underwent elective right carotid angiogram 11/18/2015 per Dr. Bridgett Larsson for workup and plan carotid surgery. Noted post angiogram with left sided weakness. Code stroke was called. CT of the head showed no acute cortically based infarct or acute intracranial abnormality. MRI of the brain showed scattered foci of acute nonhemorrhagic infarct involving the posterior right frontal lobe, right parietal lobe. Neurology has been following-up with the patient. Patient did not receive TPA.. Echocardiogram with ejection fraction of 55% without PFO.  Patient transferred to CIR on 11/22/2015 .   Patient currently requires mod with mobility secondary to muscle weakness, impaired timing and sequencing, motor apraxia, decreased coordination and decreased motor planning, decreased attention to left and left side neglect and decreased standing balance, decreased postural control and decreased balance strategies.  Prior to hospitalization, patient was modified independent  with mobility and lived with Spouse in a House home.  Home access is 1Stairs to enter.  Patient will benefit from skilled PT intervention to maximize safe functional mobility, minimize fall risk and decrease caregiver burden for planned discharge home with intermittent assist.  Anticipate patient will benefit  from follow up [place] at discharge.  PT - End of Session Activity Tolerance: Tolerates 30+ min activity without fatigue Endurance  Deficit: No PT Assessment Rehab Potential (ACUTE/IP ONLY): Good Barriers to Discharge: Decreased caregiver support PT Patient demonstrates impairments in the following area(s): Balance;Behavior;Motor;Pain;Perception;Safety;Sensory PT Transfers Functional Problem(s): Bed Mobility;Bed to Chair;Car;Furniture PT Locomotion Functional Problem(s): Ambulation;Wheelchair Mobility;Stairs PT Plan PT Intensity: Minimum of 1-2 x/day ,45 to 90 minutes PT Frequency: 5 out of 7 days PT Duration Estimated Length of Stay: 9-14days  PT Treatment/Interventions: Ambulation/gait training;Balance/vestibular training;Cognitive remediation/compensation;Community reintegration;Discharge planning;Disease management/prevention;DME/adaptive equipment instruction;Functional mobility training;Neuromuscular re-education;Pain management;Patient/family education;Psychosocial support;Skin care/wound management;Stair training;Splinting/orthotics;Therapeutic Activities;Therapeutic Exercise;UE/LE Strength taining/ROM;UE/LE Coordination activities;Visual/perceptual remediation/compensation;Wheelchair propulsion/positioning PT Transfers Anticipated Outcome(s): Mod I With LRAD PT Locomotion Anticipated Outcome(s): Supervision with LRAD . PT Recommendation Follow Up Recommendations: Home health PT Patient destination: Home Equipment Recommended: Rolling walker with 5" wheels;Wheelchair (measurements);Wheelchair cushion (measurements)  Skilled Therapeutic Intervention PT performed evaluation and initiated treatment: see below for eval.  Patient instructed in gait training for 11f with mod A and consistent posterior lean and PT required to block L knee x 5 to prevent buckling; patient able to engage knee extensors with cueing from, but unable to carry into following steps. Stair training with mod A and constant cues for sequencing and occasional L knee buckling. Car transfer x 1 without at and mod A and x 1 with RW and min-mod A with  constant cues for proper sequencing and UE positioning as well as improved LLE control/activaiton. Patient instructed in gait on uneven surface with mod A and constant cues for improved LLE knee control,  At end of session, patient returned to room and left with wife present and call bell within reach.   PT Evaluation Precautions/Restrictions Precautions Precautions: Fall Restrictions Weight Bearing Restrictions: No General Chart Reviewed: Yes Family/Caregiver Present: No Vital SignsTherapy Vitals Temp: 98.4 F (36.9 C) Temp Source: Oral Pulse Rate: 69 Resp: 17 BP: (!) 105/58 mmHg Patient Position (if appropriate): Sitting Oxygen Therapy SpO2: 95 % O2 Device: Not Delivered Pain Pain Assessment Pain Assessment: 0-10 Pain Score: 4  Pain Type: Acute pain Pain Location: Neck Pain Orientation: Left;Posterior Pain Descriptors / Indicators: Tightness Pain Onset: On-going Home Living/Prior Functioning Home Living Available Help at Discharge: Available PRN/intermittently (wife works day shift) Type of Home: House Home Access: Stairs to enter ETechnical brewerof Steps: 1 Entrance Stairs-Rails: None Home Layout: Laundry or work area in basement;One level Bathroom Shower/Tub: Walk-in shower;Door BConocoPhillipsToilet: Standard Bathroom Accessibility: Yes  Lives With: Spouse Prior Function Level of Independence: Independent with basic ADLs;Independent with gait  Able to Take Stairs?: Yes Driving: Yes Vocation: Retired ([place] Leisure: Hobbies-yes (Comment) (plays golf 3x/week) Vision/Perception  Vision - Assessment Eye Alignment: Within Functional Limits Ocular Range of Motion: Within Functional Limits Alignment/Gaze Preference: Within Defined Limits Tracking/Visual Pursuits: Able to track stimulus in all quads without difficulty Praxis Praxis-Other Comments: Mild LLE apraxia with fuctional mobility.  Cognition Overall Cognitive Status: Impaired/Different from  baseline Attention: Selective Selective Attention: Appears intact Awareness: Impaired Awareness Impairment: Emergent impairment Problem Solving: Appears intact Behaviors: Impulsive Safety/Judgment: Impaired Sensation Sensation Light Touch: Appears Intact (intact in BLE ) Stereognosis: Not tested Hot/Cold: Appears Intact Proprioception: Appears Intact Coordination Gross Motor Movements are Fluid and Coordinated: No Fine Motor Movements are Fluid and Coordinated: No Finger Nose Finger Test: WFL on Rt, mild decrease in speed and accuracy on Lt Heel Shin Test: Decreased speed with LLE compared to R.  Motor  Motor Motor: Hemiplegia;Motor apraxia Motor -  Skilled Clinical Observations: Decreased activation of LLE extensors and decreased weight bearing on LLE.   Mobility Bed Mobility Bed Mobility: Rolling Right;Rolling Left;Supine to Sit;Sit to Supine;Sitting - Scoot to [employer] of Bed Rolling Right: 4: Min assist Rolling Right Details: Verbal cues for sequencing;Verbal cues for technique;Verbal cues for precautions/safety Rolling Right Details (indicate cue type and reason): Verbal and tactile cues for improved use of LLE  Rolling Left: 4: Min assist Rolling Left Details: Verbal cues for sequencing;Verbal cues for technique;Verbal cues for precautions/safety Rolling Left Details (indicate cue type and reason): Verbal cues for attention to L arm to prevent trapping under body  Supine to Sit: 4: Min assist Supine to Sit Details: Verbal cues for sequencing;Verbal cues for technique;Verbal cues for precautions/safety;Tactile cues for posture;Tactile cues for placement Sitting - Scoot to Edge of Bed: 4: Min guard Sit to Supine: 4: Min assist Sit to Supine - Details: Verbal cues for sequencing;Verbal cues for precautions/safety;Verbal cues for technique;Tactile cues for sequencing;Tactile cues for weight shifting;Tactile cues for posture Transfers Transfers: Yes Sit to Stand: 3: Mod assist Sit  to Stand Details: Verbal cues for sequencing;Verbal cues for technique;Verbal cues for precautions/safety;Manual facilitation for weight shifting;Manual facilitation for weight bearing;Manual facilitation for placement Stand to Sit: 3: Mod assist Stand to Sit Details (indicate cue type and reason): Verbal cues for safe use of DME/AE;Verbal cues for technique;Verbal cues for sequencing;Manual facilitation for weight shifting;Tactile cues for sequencing;Tactile cues for weight shifting Stand Pivot Transfers: 3: Mod assist Stand Pivot Transfer Details: Verbal cues for sequencing;Verbal cues for technique;Visual cues/gestures for sequencing;Tactile cues for weight shifting;Tactile cues for posture;Verbal cues for gait pattern;Verbal cues for safe use of DME/AE;Manual facilitation for weight shifting Locomotion  Ambulation Ambulation: Yes Ambulation Distance (Feet): 55 Feet Assistive device: Rolling walker Gait Gait: Yes Gait Pattern: Step-to pattern;Step-through pattern;Decreased hip/knee flexion - left;Decreased dorsiflexion - left;Decreased weight shift to left;Lateral hip instability;Trunk flexed;Wide base of support;Poor foot clearance - right Gait velocity: decreased  Stairs / Additional Locomotion Stairs: Yes Stairs Assistance: 3: Mod assist Stairs Assistance Details: Tactile cues for sequencing;Tactile cues for weight shifting;Visual cues for safe use of DME/AE;Visual cues/gestures for precautions/safety;Visual cues/gestures for sequencing;Verbal cues for sequencing;Verbal cues for technique;Manual facilitation for weight bearing;Verbal cues for safe use of DME/AE;Verbal cues for precautions/safety;Manual facilitation for weight shifting;Verbal cues for gait pattern;Tactile cues for weight beaing;Tactile cues for posture Stair Management Technique: Two rails Number of Stairs: 12 Height of Stairs: 4 Wheelchair Mobility Wheelchair Mobility: Yes Wheelchair Assistance: 4: Risk manager Details: Verbal cues for sequencing;Verbal cues for technique;Verbal cues for precautions/safety;Verbal cues for safe use of DME/AE;Tactile cues for sequencing;Tactile cues for weight shifting;Tactile cues for placement Wheelchair Propulsion: Both upper extremities;Right lower extremity Wheelchair Parts Management: Needs assistance Distance: 149f  Trunk/Postural Assessment  Cervical Assessment Cervical Assessment: [facility] to [facility](Decrease [facility] due to neck pain ) Thoracic Assessment Thoracic Assessment: Within Functional Limits Lumbar Assessment Lumbar Assessment: Within Functional Limits Postural Control Postural Control: Deficits on evaluation Righting Reactions: delayed   Protective Responses: delayed   Balance Balance Balance Assessed: Yes Static Sitting Balance Static Sitting - Level of Assistance: 6: Modified independent (Device/Increase time) Dynamic Sitting Balance Dynamic Sitting - Level of Assistance: 5: Stand by assistance Static Standing Balance Static Standing - Balance Support: No upper extremity supported Static Standing - Level of Assistance: 2: Max assist Dynamic Standing Balance Dynamic Standing - Balance Support: Bilateral upper extremity supported;Right upper extremity supported Dynamic Standing - Level of Assistance: 3: Mod assist Dynamic  Standing - Balance Activities: Forward lean/weight shifting;Lateral lean/weight shifting;Reaching for weighted objects Extremity Assessment      RLE Assessment RLE Assessment: Within Functional Limits LLE Assessment LLE Assessment: Exceptions to [facility] LLE Strength LLE Overall Strength Comments: 4/5 for all hip knee and ankles motions tested.     See Function Navigator for Current Functional Status.   Refer to Care Plan for Long Term Goals  Recommendations for other services: None  Discharge Criteria: Patient will be discharged from PT if patient refuses treatment 3 consecutive times without medical  reason, if treatment goals not met, if there is a change in medical status, if patient makes no progress towards goals or if patient is discharged from hospital.  The above assessment, treatment plan, treatment alternatives and goals were discussed and mutually agreed upon: by patient  Lorie Phenix 11/23/2015, 5:40 PM

## 2015-11-23 NOTE — Progress Notes (Signed)
[facility] PHYSICAL MEDICINE & REHABILITATION     PROGRESS NOTE  Subjective/Complaints:  Patient seen lying in bed this morning. States he slept well during his first night. He is ready for his day therapies.  ROS: Denies CP, SOB, nausea, vomiting, diarrhea.  Objective: Vital Signs: Blood pressure 105/58, pulse 69, temperature 98.4 F (36.9 C), temperature source Oral, resp. rate 17, height 5' 5"  (1.651 m), weight 76.885 kg (169 lb 8 oz), SpO2 95 %. No results found.  Recent Labs  11/22/15 1848  WBC 11.1*  HGB 14.0  HCT 41.8  PLT 196    Recent Labs  11/22/15 1848  NA 134*  K 4.3  CL 97*  GLUCOSE 148*  BUN 41*  CREATININE 1.94*  CALCIUM 9.5   CBG (last 3)   Recent Labs  11/23/15 0740 11/23/15 1135 11/23/15 1633  GLUCAP 161* 138* 170*    Wt Readings from Last 3 Encounters:  11/23/15 76.885 kg (169 lb 8 oz)  11/22/15 78 kg (171 lb 15.3 oz)  11/08/15 80.196 kg (176 lb 12.8 oz)    Physical Exam:  BP 105/58 mmHg  Pulse 69  Temp(Src) 98.4 F (36.9 C) (Oral)  Resp 17  Ht 5' 5"  (1.651 m)  Wt 76.885 kg (169 lb 8 oz)  BMI 28.21 kg/m2  SpO2 95% Constitutional: He appears well-developed. NAD. Vital signs reviewed. HENT: Normocephalic. Atraumatic Eyes: EOM and conjunctiva are normal.  Cardiovascular: Cardiac rate control. + Murmur  Respiratory: Effort normal and breath sounds normal. No respiratory distress.  GI: Soft. Bowel sounds are normal. He exhibits no distension.  Musculoskeletal: No edema. + Mild TTP left neck  Neurological: He is alert and oriented Motor: 5/5 RUE/RLE LUE/LLE: 4+/5 proximal to distal. Mild ataxia left upper extremity.  Skin: Skin is warm and dry Psych: mood is pleasant and cooperative. Normal mood and affect.  Assessment/Plan: 1. Functional deficits secondary to right frontal temporal infarct which require 3+ hours per day of interdisciplinary therapy in a comprehensive inpatient rehab setting. Physiatrist is providing  close team supervision and 24 hour management of active medical problems listed below. Physiatrist and rehab team continue to assess barriers to discharge/monitor patient progress toward functional and medical goals.  Function:  Bathing Bathing position   Position: Shower  Bathing parts Body parts bathed by patient: Right arm, Left arm, Chest, Abdomen, Front perineal area, Right upper leg, Left upper leg Body parts bathed by helper: Buttocks  Bathing assist Assist Level: Touching or steadying assistance(Pt > 75%) (Mod assist)      Upper Body Dressing/Undressing Upper body dressing   What is the patient wearing?: Pull over shirt/dress     Pull over shirt/dress - Perfomed by patient: Thread/unthread right sleeve, Thread/unthread left sleeve, Put head through opening, Pull shirt over trunk          Upper body assist Assist Level: Set up   Set up : To obtain clothing/put away  Lower Body Dressing/Undressing Lower body dressing   What is the patient wearing?: Underwear, Pants, Socks, Shoes Underwear - Performed by patient: Thread/unthread right underwear leg, Thread/unthread left underwear leg Underwear - Performed by helper: Pull underwear up/down Pants- Performed by patient: Thread/unthread right pants leg, Thread/unthread left pants leg Pants- Performed by helper: Pull pants up/down     Socks - Performed by patient: Don/doff right sock, Don/doff left sock   Shoes - Performed by patient: Don/doff right shoe, Don/doff left shoe, Fasten right, Fasten left  Lower body assist Assist for lower body dressing: Touching or steadying assistance (Pt > 75%), Supervision or verbal cues, Set up   Set up : To obtain clothing/put away  Toileting Toileting          Toileting assist     Transfers Chair/bed transfer   Chair/bed transfer method: Stand pivot Chair/bed transfer assist level: Moderate assist (Pt 50 - 74%/lift or lower) Chair/bed transfer assistive device:  Walker, Air cabin crew     Max distance: 27f Assist level: Moderate assist (Pt 50 - 74%)   Wheelchair   Type: Manual Max wheelchair distance: 1065fAssist Level: Touching or steadying assistance (Pt > 75%)  Cognition Comprehension Comprehension assist level: Understands complex 90% of the time/cues 10% of the time  Expression Expression assist level: Expresses complex 90% of the time/cues < 10% of the time  Social Interaction Social Interaction assist level: Interacts appropriately 90% of the time - Needs monitoring or encouragement for participation or interaction.  Problem Solving Problem solving assist level: Solves basic 90% of the time/requires cueing < 10% of the time  Memory Memory assist level: Recognizes or recalls 25 - 49% of the time/requires cueing 50 - 75% of the time    Medical Problem List and Plan: 1. Left hemiparesthesias with balance deficits mild cognitive impairment secondary to right frontoparietal infarct after carotid angiogram. Follow-up 4 weeks outpatient vascular surgery to consider carotid enterectomy  Begin CIR 2. DVT Prophylaxis/Anticoagulation: Subcutaneous Lovenox. Monitor platelet counts and any signs of bleeding 3. Pain Management: Oxycodone as needed -kpad for left neck, ROM with therapy 4. Diabetes mellitus with peripheral neuropathy. Glucophage 1000 mg twice a day. Check blood sugars before meals and at bedtime. Diabetic teaching 5. Neuropsych: This patient is capable of making decisions on his own behalf. 6. Skin/Wound Care: Routine skin checks 7. Fluids/Electrolytes/Nutrition: Routine I&O's  Mild hyponatremia: 134 on 5/5, will continue to monitor 8. Hypertension. Norvasc 10 mg daily, hydrochlorothiazide 25 mg daily, lisinopril 40 mg daily, Toprol-XL 100 mg daily. Monitor with increased activity 9. Diastolic congestive heart failure. Monitor for any signs of fluid overload. Weigh patient daily 10.CRI. Baseline  creatinine 1.40-1.48.   Creatinine 1.94 on 5/5  Will encourage by mouth fluid intake 11. Hyperlipidemia. Pravachol 12. Leukocytosis  WBCs 11.1 on 5/5  Afebrile, no signs and symptoms of infection.  Will continue to monitor  LOS (Days) 1 A FACE TO FACE EVALUATION WAS PERFORMED  Ankit AnLorie Phenix/12/2015 9:16 PM

## 2015-11-24 LAB — GLUCOSE, CAPILLARY
GLUCOSE-CAPILLARY: 149 mg/dL — AB (ref 65–99)
Glucose-Capillary: 132 mg/dL — ABNORMAL HIGH (ref 65–99)
Glucose-Capillary: 116 mg/dL — ABNORMAL HIGH (ref 65–99)
Glucose-Capillary: 126 mg/dL — ABNORMAL HIGH (ref 65–99)

## 2015-11-24 NOTE — Progress Notes (Signed)
[place] PHYSICAL MEDICINE & REHABILITATION     PROGRESS NOTE  Subjective/Complaints:  Patient resting comfortably in bed this morning. He is easily arousable. He states that he had a good first in therapies yesterday.  ROS: Denies CP, SOB, nausea, vomiting, diarrhea.  Objective: Vital Signs: Blood pressure 115/60, pulse 68, temperature 97.7 F (36.5 C), temperature source Oral, resp. rate 18, height 5' 5"  (1.651 m), weight 76.295 kg (168 lb 3.2 oz), SpO2 98 %. No results found.  Recent Labs  11/22/15 1848  WBC 11.1*  HGB 14.0  HCT 41.8  PLT 196    Recent Labs  11/22/15 1848  NA 134*  K 4.3  CL 97*  GLUCOSE 148*  BUN 41*  CREATININE 1.94*  CALCIUM 9.5   CBG (last 3)   Recent Labs  11/23/15 1633 11/23/15 2154 11/24/15 0701  GLUCAP 170* 128* 132*    Wt Readings from Last 3 Encounters:  11/24/15 76.295 kg (168 lb 3.2 oz)  11/22/15 78 kg (171 lb 15.3 oz)  11/08/15 80.196 kg (176 lb 12.8 oz)    Physical Exam:  BP 115/60 mmHg  Pulse 68  Temp(Src) 97.7 F (36.5 C) (Oral)  Resp 18  Ht 5' 5"  (1.651 m)  Wt 76.295 kg (168 lb 3.2 oz)  BMI 27.99 kg/m2  SpO2 98% Constitutional: He appears well-developed. NAD. Vital signs reviewed. HENT: Normocephalic. Atraumatic Eyes: EOM and conjunctiva are normal.  Cardiovascular: Cardiac rate control. + Murmur  Respiratory: Effort normal and breath sounds normal. No respiratory distress.  GI: Soft. Bowel sounds are normal. He exhibits no distension.  Musculoskeletal: No edema. + Mild TTP left neck  Neurological: He is alert and oriented Motor: 5/5 RUE/RLE LUE/LLE: 4+/5 proximal to distal. Mild ataxia left upper extremity.  Skin: Skin is warm and dry Psych: mood is pleasant and cooperative. Normal mood and affect.  Assessment/Plan: 1. Functional deficits secondary to right frontal temporal infarct which require 3+ hours per day of interdisciplinary therapy in a comprehensive inpatient rehab setting. Physiatrist  is providing close team supervision and 24 hour management of active medical problems listed below. Physiatrist and rehab team continue to assess barriers to discharge/monitor patient progress toward functional and medical goals.  Function:  Bathing Bathing position   Position: Shower  Bathing parts Body parts bathed by patient: Right arm, Left arm, Chest, Abdomen, Front perineal area, Right upper leg, Left upper leg Body parts bathed by helper: Buttocks  Bathing assist Assist Level: Touching or steadying assistance(Pt > 75%) (Mod assist)      Upper Body Dressing/Undressing Upper body dressing   What is the patient wearing?: Pull over shirt/dress     Pull over shirt/dress - Perfomed by patient: Thread/unthread right sleeve, Thread/unthread left sleeve, Put head through opening, Pull shirt over trunk          Upper body assist Assist Level: Set up   Set up : To obtain clothing/put away  Lower Body Dressing/Undressing Lower body dressing   What is the patient wearing?: Underwear, Pants, Socks, Shoes Underwear - Performed by patient: Thread/unthread right underwear leg, Thread/unthread left underwear leg Underwear - Performed by helper: Pull underwear up/down Pants- Performed by patient: Thread/unthread right pants leg, Thread/unthread left pants leg Pants- Performed by helper: Pull pants up/down     Socks - Performed by patient: Don/doff right sock, Don/doff left sock   Shoes - Performed by patient: Don/doff right shoe, Don/doff left shoe, Fasten right, Fasten left  Lower body assist Assist for lower body dressing: Touching or steadying assistance (Pt > 75%), Supervision or verbal cues, Set up   Set up : To obtain clothing/put away  Toileting Toileting          Toileting assist     Transfers Chair/bed transfer   Chair/bed transfer method: Stand pivot Chair/bed transfer assist level: Moderate assist (Pt 50 - 74%/lift or lower) Chair/bed transfer  assistive device: Walker, Air cabin crew     Max distance: 38f Assist level: Moderate assist (Pt 50 - 74%)   Wheelchair   Type: Manual Max wheelchair distance: 1011fAssist Level: Touching or steadying assistance (Pt > 75%)  Cognition Comprehension Comprehension assist level: Understands basic 90% of the time/cues < 10% of the time  Expression Expression assist level: Expresses basic 90% of the time/requires cueing < 10% of the time.  Social Interaction Social Interaction assist level: Interacts appropriately 90% of the time - Needs monitoring or encouragement for participation or interaction.  Problem Solving Problem solving assist level: Solves basic 90% of the time/requires cueing < 10% of the time  Memory Memory assist level: Recognizes or recalls 90% of the time/requires cueing < 10% of the time    Medical Problem List and Plan: 1. Left hemiparesthesias with balance deficits mild cognitive impairment secondary to right frontoparietal infarct after carotid angiogram. Follow-up 4 weeks outpatient vascular surgery to consider carotid enterectomy  Continue CIR 2. DVT Prophylaxis/Anticoagulation: Subcutaneous Lovenox. Monitor platelet counts and any signs of bleeding 3. Pain Management: Oxycodone as needed -kpad for left neck, ROM with therapy 4. Diabetes mellitus with peripheral neuropathy. Glucophage 1000 mg twice a day. Check blood sugars before meals and at bedtime. Diabetic teaching 5. Neuropsych: This patient is capable of making decisions on his own behalf. 6. Skin/Wound Care: Routine skin checks 7. Fluids/Electrolytes/Nutrition: Routine I&O's  Mild hyponatremia: 134 on 5/5, will continue to monitor  Will order labs for tomorrow 8. Hypertension. Norvasc 10 mg daily, hydrochlorothiazide 25 mg daily, lisinopril 40 mg daily, Toprol-XL 100 mg daily. Monitor with increased activity 9. Diastolic congestive heart failure. Monitor for any signs of  fluid overload. Weigh patient daily 10.CRI. Baseline creatinine 1.40-1.48.   Creatinine 1.94 on 5/5  Will encourage by mouth fluid intake  Will order labs for tomorrow 11. Hyperlipidemia. Pravachol 12. Leukocytosis  WBCs 11.1 on 5/5  Afebrile, no signs and symptoms of infection.  Will continue to monitor  Will order labs for tomorrow  LOS (Days) 2 A FACE TO FACE EVALUATION WAS PERFORMED  Gaege Sangalang AnLorie Phenix/01/2016 8:50 AM

## 2015-11-24 NOTE — Progress Notes (Signed)
Patient has had no further episodes of vomiting since Zofran administration at 1926 .  Denies discomfort.  VSS.  Bowel sounds active x4.  Will continue to monitor.   Fredna Dow M

## 2015-11-25 ENCOUNTER — Inpatient Hospital Stay (HOSPITAL_COMMUNITY): Payer: BLUE CROSS/BLUE SHIELD | Admitting: Speech Pathology

## 2015-11-25 ENCOUNTER — Inpatient Hospital Stay (HOSPITAL_COMMUNITY): Payer: BLUE CROSS/BLUE SHIELD

## 2015-11-25 ENCOUNTER — Inpatient Hospital Stay (HOSPITAL_COMMUNITY): Payer: BLUE CROSS/BLUE SHIELD | Admitting: Physical Therapy

## 2015-11-25 LAB — CBC WITH DIFFERENTIAL/PLATELET
BASOS ABS: 0 10*3/uL (ref 0.0–0.1)
BASOS PCT: 1 %
Basophils Absolute: 0 10*3/uL (ref 0.0–0.1)
Basophils Relative: 0 %
Eosinophils Absolute: 0.2 10*3/uL (ref 0.0–0.7)
Eosinophils Absolute: 0.2 10*3/uL (ref 0.0–0.7)
Eosinophils Relative: 3 %
Eosinophils Relative: 2 %
HEMATOCRIT: 38.4 % — AB (ref 39.0–52.0)
HEMATOCRIT: 40.4 % (ref 39.0–52.0)
HEMOGLOBIN: 13.1 g/dL (ref 13.0–17.0)
HEMOGLOBIN: 13.9 g/dL (ref 13.0–17.0)
LYMPHS ABS: 2 10*3/uL (ref 0.7–4.0)
Lymphocytes Relative: 25 %
Lymphocytes Relative: 24 %
Lymphs Abs: 1.8 10*3/uL (ref 0.7–4.0)
MCH: 28.2 pg (ref 26.0–34.0)
MCH: 28.6 pg (ref 26.0–34.0)
MCHC: 34.1 g/dL (ref 30.0–36.0)
MCHC: 34.4 g/dL (ref 30.0–36.0)
MCV: 82.8 fL (ref 78.0–100.0)
MCV: 83.1 fL (ref 78.0–100.0)
MONOS PCT: 12 %
MONOS PCT: 15 %
Monocytes Absolute: 0.9 10*3/uL (ref 0.1–1.0)
Monocytes Absolute: 1.3 10*3/uL — ABNORMAL HIGH (ref 0.1–1.0)
NEUTROS ABS: 4.4 10*3/uL (ref 1.7–7.7)
NEUTROS ABS: 5 10*3/uL (ref 1.7–7.7)
NEUTROS PCT: 59 %
NEUTROS PCT: 59 %
Platelets: 203 10*3/uL (ref 150–400)
Platelets: 233 10*3/uL (ref 150–400)
RBC: 4.64 MIL/uL (ref 4.22–5.81)
RBC: 4.86 MIL/uL (ref 4.22–5.81)
RDW: 13.2 % (ref 11.5–15.5)
RDW: 13.1 % (ref 11.5–15.5)
WBC: 7.3 10*3/uL (ref 4.0–10.5)
WBC: 8.5 10*3/uL (ref 4.0–10.5)

## 2015-11-25 LAB — BASIC METABOLIC PANEL
Anion gap: 11 (ref 5–15)
BUN: 43 mg/dL — AB (ref 6–20)
CHLORIDE: 99 mmol/L — AB (ref 101–111)
CO2: 27 mmol/L (ref 22–32)
CREATININE: 1.67 mg/dL — AB (ref 0.61–1.24)
Calcium: 10.2 mg/dL (ref 8.9–10.3)
GFR calc non Af Amer: 39 mL/min — ABNORMAL LOW (ref >60)
GFR, EST AFRICAN AMERICAN: 46 mL/min — AB (ref >60)
Glucose, Bld: 95 mg/dL (ref 65–99)
Potassium: 5.1 mmol/L (ref 3.5–5.1)
Sodium: 137 mmol/L (ref 135–145)

## 2015-11-25 LAB — GLUCOSE, CAPILLARY
GLUCOSE-CAPILLARY: 177 mg/dL — AB (ref 65–99)
Glucose-Capillary: 140 mg/dL — ABNORMAL HIGH (ref 65–99)
Glucose-Capillary: 105 mg/dL — ABNORMAL HIGH (ref 65–99)
Glucose-Capillary: 88 mg/dL (ref 65–99)

## 2015-11-25 MED ORDER — SENNOSIDES-DOCUSATE SODIUM 8.6-50 MG PO TABS
1.0000 | ORAL_TABLET | Freq: Two times a day (BID) | ORAL | Status: DC
  Administered 2015-11-25 – 2015-12-04 (×18): 1 via ORAL
  Filled 2015-11-25 (×18): qty 1

## 2015-11-25 NOTE — Progress Notes (Signed)
Occupational Therapy Session Note  Patient Details  Name: Evan Stanley MRN: [id number] Date of Birth: March 03, 1941  Today's Date: 11/25/2015 OT Individual Time: 1000-1100 OT Individual Time Calculation (min): 60 min   Short Term Goals: Week 1:  OT Short Term Goal 1 (Week 1): Pt will complete toilet transfers with supervision with LRAD OT Short Term Goal 2 (Week 1): Pt will complete walk-in shower transfers with min assist with LRAD OT Short Term Goal 3 (Week 1): Pt will complete LB dressing with supervision OT Short Term Goal 4 (Week 1): Pt will complete toileting tasks with supervision  Skilled Therapeutic Interventions/Progress Updates: ADL-retraining at shower level with focus on improved functional mobility, dynamic standing balance, and safety awareness.   Pt received seated in recliner and receptive to bathe/dress.   Pt requires vc and instructional cues for safe use of RW when rising to stand and min guard assist while ambulating d/t gait impairment (Trendelenberg Gait pattern).  Pt demo'd mild LOB 2 times during session while ambulating.   Overall pt bathes and dresses with minA-setup with good thoroughness but requires vc for safety while standing d/t only emergent awareness of balance deficits.         Therapy Documentation Precautions:  Precautions Precautions: Fall Restrictions Weight Bearing Restrictions: No  Pain: Pain Assessment Pain Assessment:  (did not rate) Pain Score: 0-No pain Pain Location: Shoulder Pain Orientation: Left Pain Descriptors / Indicators:  (stiffness) Pain Intervention(s): Repositioned;Ambulation/increased activity  See Function Navigator for Current Functional Status.   Therapy/Group: Individual Therapy  [place] 11/25/2015, 12:50 PM

## 2015-11-25 NOTE — Progress Notes (Signed)
[place] PHYSICAL MEDICINE & REHABILITATION     PROGRESS NOTE  Subjective/Complaints:  Patient walking with PT this morning. He drifts to the right.  ROS: Denies CP, SOB, nausea, vomiting, diarrhea.  Objective: Vital Signs: Blood pressure 120/63, pulse 61, temperature 97.5 F (36.4 C), temperature source Oral, resp. rate 18, height 5' 5"  (1.651 m), weight 76.295 kg (168 lb 3.2 oz), SpO2 99 %. No results found.  Recent Labs  11/22/15 1848 11/25/15 0644  WBC 11.1* 7.3  HGB 14.0 13.1  HCT 41.8 38.4*  PLT 196 203    Recent Labs  11/22/15 1848  NA 134*  K 4.3  CL 97*  GLUCOSE 148*  BUN 41*  CREATININE 1.94*  CALCIUM 9.5   CBG (last 3)   Recent Labs  11/24/15 1655 11/24/15 2109 11/25/15 0704  GLUCAP 116* 126* 140*    Wt Readings from Last 3 Encounters:  11/24/15 76.295 kg (168 lb 3.2 oz)  11/22/15 78 kg (171 lb 15.3 oz)  11/08/15 80.196 kg (176 lb 12.8 oz)    Physical Exam:  BP 120/63 mmHg  Pulse 61  Temp(Src) 97.5 F (36.4 C) (Oral)  Resp 18  Ht 5' 5"  (1.651 m)  Wt 76.295 kg (168 lb 3.2 oz)  BMI 27.99 kg/m2  SpO2 99% Constitutional: He appears well-developed. NAD. Vital signs reviewed. HENT: Normocephalic. Atraumatic Eyes: EOM and conjunctiva are normal.  Cardiovascular: Cardiac rate control. + Murmur  Respiratory: Effort normal and breath sounds normal. No respiratory distress.  GI: Soft. Bowel sounds are normal. He exhibits no distension.  Musculoskeletal: No edema.  Neurological: He is alert and oriented Motor: 5/5 RUE/RLE LUE/LLE: 4+/5 proximal to distal. Mild ataxia left upper extremity.  Skin: Skin is warm and dry Psych: mood is pleasant and cooperative. Normal mood and affect.  Assessment/Plan: 1. Functional deficits secondary to right frontal temporal infarct which require 3+ hours per day of interdisciplinary therapy in a comprehensive inpatient rehab setting. Physiatrist is providing close team supervision and 24 hour  management of active medical problems listed below. Physiatrist and rehab team continue to assess barriers to discharge/monitor patient progress toward functional and medical goals.  Function:  Bathing Bathing position   Position: Shower  Bathing parts Body parts bathed by patient: Right arm, Left arm, Chest, Abdomen, Front perineal area, Right upper leg, Left upper leg Body parts bathed by helper: Buttocks  Bathing assist Assist Level: Touching or steadying assistance(Pt > 75%) (Mod assist)      Upper Body Dressing/Undressing Upper body dressing   What is the patient wearing?: Pull over shirt/dress     Pull over shirt/dress - Perfomed by patient: Thread/unthread right sleeve, Thread/unthread left sleeve, Put head through opening, Pull shirt over trunk          Upper body assist Assist Level: Set up   Set up : To obtain clothing/put away  Lower Body Dressing/Undressing Lower body dressing   What is the patient wearing?: Underwear, Pants, Socks, Shoes Underwear - Performed by patient: Thread/unthread right underwear leg, Thread/unthread left underwear leg Underwear - Performed by helper: Pull underwear up/down Pants- Performed by patient: Thread/unthread right pants leg, Thread/unthread left pants leg Pants- Performed by helper: Pull pants up/down     Socks - Performed by patient: Don/doff right sock, Don/doff left sock   Shoes - Performed by patient: Don/doff right shoe, Don/doff left shoe, Fasten right, Fasten left            Lower body assist Assist for lower body  dressing: Touching or steadying assistance (Pt > 75%), Supervision or verbal cues, Set up   Set up : To obtain clothing/put away  Toileting Toileting     Toileting steps completed by helper: Adjust clothing prior to toileting, Performs perineal hygiene, Adjust clothing after toileting (per Malachi Carl, NT report)    Toileting assist     Transfers Chair/bed transfer   Chair/bed transfer method:  Stand pivot Chair/bed transfer assist level: Moderate assist (Pt 50 - 74%/lift or lower) Chair/bed transfer assistive device: Walker, Air cabin crew     Max distance: 120 ft Assist level: Touching or steadying assistance (Pt > 75%)   Wheelchair   Type: Manual Max wheelchair distance: 166f Assist Level: Touching or steadying assistance (Pt > 75%)  Cognition Comprehension Comprehension assist level: Understands basic 90% of the time/cues < 10% of the time  Expression Expression assist level: Expresses basic 90% of the time/requires cueing < 10% of the time.  Social Interaction Social Interaction assist level: Interacts appropriately 90% of the time - Needs monitoring or encouragement for participation or interaction.  Problem Solving Problem solving assist level: Solves basic 90% of the time/requires cueing < 10% of the time  Memory Memory assist level: Recognizes or recalls 90% of the time/requires cueing < 10% of the time    Medical Problem List and Plan: 1. Left hemiparesthesias with balance deficits mild cognitive impairment secondary to right frontoparietal infarct after carotid angiogram. Follow-up 4 weeks outpatient vascular surgery to consider carotid enterectomy  Continue CIR 2. DVT Prophylaxis/Anticoagulation: Subcutaneous Lovenox. Monitor platelet counts and any signs of bleeding 3. Pain Management: Oxycodone as needed -kpad for left neck, ROM with therapy 4. Diabetes mellitus with peripheral neuropathy. Glucophage 1000 mg twice a day. Check blood sugars before meals and at bedtime. Diabetic teaching 5. Neuropsych: This patient is capable of making decisions on his own behalf. 6. Skin/Wound Care: Routine skin checks 7. Fluids/Electrolytes/Nutrition: Routine I&O's  Mild hyponatremia: 134 on 5/5, will continue to monitor  Labs pending 8. Hypertension. Norvasc 10 mg daily, hydrochlorothiazide 25 mg daily, lisinopril 40 mg daily, Toprol-XL  100 mg daily. Monitor with increased activity 9. Diastolic congestive heart failure. Monitor for any signs of fluid overload. Weigh patient daily 10.CRI. Baseline creatinine 1.40-1.48.   Creatinine 1.94 on 5/5  Will encourage by mouth fluid intake  Labs pending 11. Hyperlipidemia. Pravachol 12. Leukocytosis: resolved  WBCs 7.3 on 5/8  Afebrile, no signs and symptoms of infection.  Will continue to monitor  LOS (Days) 3 A FACE TO FACE EVALUATION WAS PERFORMED  Salli Bodin ALorie Phenix5/02/2016 10:00 AM

## 2015-11-25 NOTE — Progress Notes (Signed)
Speech Language Pathology Daily Session Note  Patient Details  Name: Evan Stanley MRN: [id number] Date of Birth: [birth year]  Today's Date: 11/25/2015 SLP Individual Time: 1300-1400 SLP Individual Time Calculation (min): 60 min  Short Term Goals: Week 1: SLP Short Term Goal 1 (Week 1): Patient will complete complex problem solving for mildly complex tasks with Mod A verbal cues and 90% accuracy.  SLP Short Term Goal 2 (Week 1): Patient will demonstrate anticipatory awareness and identify 3 tasks that he can partiicpate in at home safely with Mod A verbal cues.  SLP Short Term Goal 3 (Week 1): Patient will utilize external memory aids to recall new, daily information with Mod A verbal cues.  SLP Short Term Goal 4 (Week 1): Patient will complete abstract reasoning with Mod A verbal cues and 90% accuracy.   Skilled Therapeutic Interventions: Skilled treatment session focused on cognition goals. SLP facilitated session by providing Mod A to Min A verbal cues for complex problem solving tasks. Pt required Max A verbal cues for identifying 1 tasks that would be difficult to perform in the home setting given current ability level. Discussed with pt safety issues with driving and riding a lawn mower at discharge. Pt continues to have difficulty relating current physical deficits with inability to perform such tasks at home. Pt able to utilize external memory aids with Mod A verbal cues for recall of daily information. Pt returned to room, left upright in wheelchair with all needs within reach. Continue current plan of care.   Function:  Cognition Comprehension Comprehension assist level: Understands complex 90% of the time/cues 10% of the time  Expression   Expression assist level: Expresses complex 90% of the time/cues < 10% of the time  Social Interaction Social Interaction assist level: Interacts appropriately with others with medication or extra time (anti-anxiety, antidepressant).  Problem Solving  Problem solving assist level: Solves basic 90% of the time/requires cueing < 10% of the time  Memory Memory assist level: Recognizes or recalls 90% of the time/requires cueing < 10% of the time    Pain    Therapy/Group: Individual Therapy  Haidan Nhan 11/25/2015, 4:19 PM

## 2015-11-25 NOTE — Progress Notes (Signed)
Physical Therapy Session Note  Patient Details  Name: Evan Stanley MRN: [id number] Date of Birth: 11-04-1940  Today's Date: 11/25/2015 PT Individual Time: 0802-0900 and 1415-1447 PT Individual Time Calculation (min): 58 min and 32 min  Short Term Goals: Week 1:  PT Short Term Goal 1 (Week 1): Patient will perform Stand/sqaut pivot with min A.  PT Short Term Goal 2 (Week 1): Patient will ambulate 137f with min A with LRAD PT Short Term Goal 3 (Week 1): Patient will performed bed mobility with supervision A.  PT Short Term Goal 4 (Week 1): Patient will ascend/descent 4 steps with min A from PT.   Skilled Therapeutic Interventions/Progress Updates:    Treatment 1: Pt received sitting EOB eating breakfast. Pt reports impaired vision following first stroke 2 years ago and no visual changes following most recent stroke leading to admission.  Gait training x 120 ft with RW & Min A; pt exhibits L hip weakness, decreased weight shift, toe drag L. Trialed L foot up brace x 80 ft with RW with improving toe clearance but still limited by L hip drop 2/2 muscle weakness causing foot drag. Performed lateral stepping in parallel bars with orange theraband for L abductor strengthening to pt fatigue. Gait training back to room x 120 ft with RW & Min A, increased foot drag LLE & weak hip flexors, tren[place]leburg gait. Pt reported need to use restroom & able to complete toilet transfers with use of grab bars & steady A. At end of session pt left in recliner & able to demonstrate proper use of call bell. Pt left with BLE elevated & all needs within reach.   Throughout session pt required maximum verbal cuing to reach back & hold on to stable surface before transferring sit<>stand.  Treatment 2: Pt received in bathroom on commode with instructions to call for PT when finished. PT then found pt standing in bathroom without calling for assistance & PT reinforced need to call before standing; (+) BM. Pt completed hand hygiene  at sink from w/c level then transported to rehab gym via w/c total A. Utilized cybex kinetron in sitting & standing at 20 cm/sec in multiple trials to fatigue. Pt required cuing for upright posture when using machine in standing as pt with significant forward trunk flexion otherwise. On mat table pt performed sidelying clam shells exercise with orange theraband with PT providing tactile & verbal cuing for ROM of exercise & manual facilitation for hip positioning. Pt performed 2 sets of 10 with LLE; pt demonstrated sufficient strength & ease when attempting task with RLE. Pt stand pivot mat>w/c with supervision & returned to room via w/c where he transferred to recliner in same manner as noted above. Pt left in recliner with RN present in room upon PT exit.   Therapy Documentation Precautions:  Precautions Precautions: Fall Restrictions Weight Bearing Restrictions: No  Pain: Pain Assessment Pain Assessment:  (did not rate) Pain Score: 0-No pain Pain Location: Shoulder Pain Orientation: Left Pain Descriptors / Indicators:  (stiffness) Pain Intervention(s): Repositioned;Ambulation/increased activity   See Function Navigator for Current Functional Status.   Therapy/Group: Individual Therapy  VWaunita Schooner5/02/2016, 8:08 AM

## 2015-11-26 ENCOUNTER — Inpatient Hospital Stay (HOSPITAL_COMMUNITY): Payer: BLUE CROSS/BLUE SHIELD | Admitting: Physical Therapy

## 2015-11-26 ENCOUNTER — Inpatient Hospital Stay (HOSPITAL_COMMUNITY): Payer: BLUE CROSS/BLUE SHIELD

## 2015-11-26 ENCOUNTER — Inpatient Hospital Stay (HOSPITAL_COMMUNITY): Payer: BLUE CROSS/BLUE SHIELD | Admitting: Speech Pathology

## 2015-11-26 DIAGNOSIS — K5901 Slow transit constipation: Secondary | ICD-10-CM

## 2015-11-26 LAB — GLUCOSE, CAPILLARY
Glucose-Capillary: 125 mg/dL — ABNORMAL HIGH (ref 65–99)
Glucose-Capillary: 147 mg/dL — ABNORMAL HIGH (ref 65–99)
Glucose-Capillary: 136 mg/dL — ABNORMAL HIGH (ref 65–99)
Glucose-Capillary: 116 mg/dL — ABNORMAL HIGH (ref 65–99)

## 2015-11-26 NOTE — Progress Notes (Signed)
Occupational Therapy Session Note  Patient Details  Name: Evan Stanley MRN: [id number] Date of Birth: February 14, 1941  Today's Date: 11/26/2015 OT Individual Time: 5749-3552 OT Individual Time Calculation (min): 40 min    Short Term Goals: Week 1:  OT Short Term Goal 1 (Week 1): Pt will complete toilet transfers with supervision with LRAD OT Short Term Goal 2 (Week 1): Pt will complete walk-in shower transfers with min assist with LRAD OT Short Term Goal 3 (Week 1): Pt will complete LB dressing with supervision OT Short Term Goal 4 (Week 1): Pt will complete toileting tasks with supervision  Skilled Therapeutic Interventions/Progress Updates: ADL-retraining with focus on improved awareness, dynamic standing balance and functional mobility using RW.   Pt arrived late for session d/t x-ray appt.   Pt ambulates with RW with min guard assist, performs transfers, bathing/dressing and grooming sitting and standing with supervision for safety.   d/t requiring cues to lock his brakes when standing from w/c or recliner and to maintain proximity to grab bar or support while standing to doff/don pants.    Pt grooms unassisted while seated at sink.  Overall pt requires supervision for lower body dressing, functional mobility, and dynamic standing balance d/t impaired standing balance with delayed awareness (intellectual awareness intact however pt either dismisses deficits or forgets safety precautions).     Therapy Documentation Precautions:  Precautions Precautions: Fall Restrictions Weight Bearing Restrictions: No   General: General OT Amount of Missed Time: 20 Minutes   Pain: Pain Assessment Pain Assessment: No/denies pain Pain Score: 0-No pain  See Function Navigator for Current Functional Status.   Therapy/Group: Individual Therapy  Zetha Kuhar 11/26/2015, 9:27 AM

## 2015-11-26 NOTE — Care Management Note (Signed)
[facility]patient [facility] Individual Statement of Services  Patient Name:  Evan Stanley  Date:  11/26/2015  Welcome to the [facility].  Our goal is to provide you with an individualized program based on your diagnosis and situation, designed to meet your specific needs.  With this comprehensive rehabilitation program, you will be expected to participate in at least 3 hours of rehabilitation therapies Monday-Friday, with modified therapy programming on the weekends.  Your rehabilitation program will include the following services:  Physical Therapy (PT), Occupational Therapy (OT), Speech Therapy (ST), 24 hour per day rehabilitation nursing, Therapeutic Recreaction (TR), Case Management (Social Worker), Rehabilitation Medicine, Nutrition Services and Pharmacy Services  Weekly team conferences will be held on Wednesday to discuss your progress.  Your Social Worker will talk with you frequently to get your input and to update you on team discussions.  Team conferences with you and your family in attendance may also be held.  Expected length of stay: 9-14 days  Overall anticipated outcome: mod/i-supervision level  Depending on your progress and recovery, your program may change. Your Social Worker will coordinate services and will keep you informed of any changes. Your Social Worker's name and contact numbers are listed  below.  The following services may also be recommended but are not provided by the [facility] will be made to provide these services after discharge if needed.  Arrangements include referral to agencies that provide these services.  Your insurance has been verified to be:  [employer] Your primary doctor is:  Alain Honey  Pertinent information will be shared with your doctor and your insurance company.  Social  Worker:  Ovidio Kin, [place] or ([phone]  Information discussed with and copy given to patient by: Elease Hashimoto, 11/26/2015, 10:24 AM

## 2015-11-26 NOTE — Progress Notes (Signed)
Social Work  Social Work Assessment and Plan  Patient Details  Name: Evan Stanley MRN: [id number] Date of Birth: [birth year]  Today's Date: 11/26/2015  Problem List:  Patient Active Problem List   Diagnosis Date Noted  . Slow transit constipation   . Hyponatremia   . Chronic renal insufficiency   . AKI (acute kidney injury) ([place])   . Leukocytosis   . Type 2 diabetes mellitus with peripheral neuropathy (HCC)   . CVA (cerebral infarction) 11/22/2015  . Acute CVA (cerebrovascular accident) ([place]) 11/19/2015  . Carotid artery disease ([place]) 11/18/2015  . Carotid artery occlusion without infarction 10/26/2014  . Carotid stenosis 10/26/2014  . Status post aortic valve replacement with bioprosthetic valve 07/05/2013  . Hyperlipidemia 07/05/2013  . Essential hypertension 07/05/2013  . CAD (coronary artery disease)   . DM (diabetes mellitus) ([place])    Past Medical History:  Past Medical History  Diagnosis Date  . Diabetes mellitus without complication ([place])   . Hyperlipidemia   . Hypertension   . CHF (congestive heart failure) ([place])   . Anxiety   . Atrial fibrillation ([place])   . Carotid artery stenosis   . TIA (transient ischemic attack)   . Decreased vision     left eye   Past Surgical History:  Past Surgical History  Procedure Laterality Date  . Cardiac valve replacement    . Aortic valve replacement    . Back surgery    . Laser surgery, left eye  Left 10-29-2015     retinal surgery/ Deloria Lair MD   . Peripheral vascular catheterization Right 11/18/2015    Procedure: Carotid Angiography;  Surgeon: Conrad [place], MD;  Location: [facility];  Service: Cardiovascular;  Laterality: Right;  . Peripheral vascular catheterization N/A 11/18/2015    Procedure: Aortic Arch Angiography;  Surgeon: Conrad Redfield, MD;  Location: [facility];  Service: Cardiovascular;  Laterality: N/A;   Social History:  reports that he quit smoking about 31 years ago. He has never used smokeless  tobacco. He reports that he does not drink alcohol or use illicit drugs.  Family / Support Systems Marital Status: Married Patient Roles: Spouse, Parent Spouse/Significant Other: [name] [phone] Children: son Other Supports: freinds who will visit Anticipated Caregiver: Wife and son Ability/Limitations of Caregiver: Wife works 7-3p but may take off short time at [place] Availability: Evenings only Family Dynamics: Close knit family wife still working and pt may need supervision level due to deficits-safety/awareness. They have friends and church members who will come by and make sure he has what he needs.   Social History Preferred language: English Religion:  Cultural Background: No issues Education: Some college Read: Yes Write: Yes Employment Status: Retired Freight forwarder Issues: No issues Guardian/Conservator: None-according to MD pt is capable of making his own decisions while here   Abuse/Neglect Physical Abuse: Denies Verbal Abuse: Denies Sexual Abuse: Denies Exploitation of patient/patient's resources: Denies Self-Neglect: Denies  Emotional Status Pt's affect, behavior adn adjustment status: Pt is motivated to improve and feels he is doing well. Needs to be reminded that he will not be safe to drive his truck or riding lawnmower. He will need someone at home to remind him of this. He is participating in therapies and feels he is making progress hopefully his awareness will improve while here. Recent Psychosocial Issues: other health issues-recentl angiogram Pyschiatric History: No history deferred depression screen due to doing well and vocal regarding his concerns and deficits. Will monitor and  intervene if team feels it is needed.  Substance Abuse History: No issues  Patient / Family Perceptions, Expectations & Goals Pt/Family understanding of illness & functional limitations: Pt and son have a good understanding of his stroke and medical issues.  Both talk with the MD and feel they have a good understanding of his stroke and treatment plan. Wife is hopful he will do well here. Premorbid pt/family roles/activities: Husband, father, grandfather, retiree, church member, Air cabin crew, etc Anticipated changes in roles/activities/participation: wants to resume all when discharged Pt/family expectations/goals: Pt states: " I plan to get back to what I was doing before this."  Son states: " I hope he continues to do well and can get back to his golf, that he loves."  [employer]: None Premorbid Home Care/DME Agencies: None Transportation available at discharge: Family Resource referrals recommended: Support group (specify)  Discharge Planning Living Arrangements: Spouse/significant other Support Systems: Spouse/significant other, Children, Other relatives, Water engineer, Social worker community Type of Residence: Private residence Insurance underwriter Resources: [employer], Multimedia programmer (specify) Nurse, mental health) Financial Resources: Fish farm manager, Family Support Financial Screen Referred: No Living Expenses: Own Money Management: Patient, Spouse Does the patient have any problems obtaining your medications?: No Home Management: Wife does inside work and he does the yard work Industrial/product designer: Return home with wife, who may take off time from work for a short time for the transition home. Their son can assist pt also while wife is working. He will need someone at first to make sure is safe since he is having safety/awareness issues. Will ask wife to come in and attend therapies with pt. Social Work Anticipated Follow Up Needs: HH/OP, Support Group  Clinical Impression Pleasant gentleman who is willing to work hard and regain his independence from this stroke. His wife is supportive and hopefully will take some time off to be home with him to assist in the transition from hospital home. Their son will assist also.  Will need to make sure family is aware of his safety deficits. Work on discharge plans.  Elease Hashimoto 11/26/2015, 11:14 AM

## 2015-11-26 NOTE — Progress Notes (Signed)
Speech Language Pathology Daily Session Note  Patient Details  Name: Evan Stanley MRN: [id number] Date of Birth: 1941-04-05  Today's Date: 11/26/2015 SLP Individual Time: 0930-1028 SLP Individual Time Calculation (min): 58 min  Short Term Goals: Week 1: SLP Short Term Goal 1 (Week 1): Patient will complete complex problem solving for mildly complex tasks with Mod A verbal cues and 90% accuracy.  SLP Short Term Goal 2 (Week 1): Patient will demonstrate anticipatory awareness and identify 3 tasks that he can partiicpate in at home safely with Mod A verbal cues.  SLP Short Term Goal 3 (Week 1): Patient will utilize external memory aids to recall new, daily information with Mod A verbal cues.  SLP Short Term Goal 4 (Week 1): Patient will complete abstract reasoning with Mod A verbal cues and 90% accuracy.   Skilled Therapeutic Interventions:  Pt was seen for skilled ST targeting cognitive goals.  Pt did not initially recall activities from previous ST therapy sessions but was adamant that he was experiencing no changes in his cognition and that he only needed to work on "getting his leg better."  SLP provided re-education regarding results of testing including current goals for memory, awareness, and problem solving.  Pt was able to count money accurately in 5 out of 6 opportunities, which improved to 6 out of 6 with mod assist verbal cues for task organization and working memory.  Pt was able to make change in 2 out of 3 opportunities independently, which improved to 3 out of 3 accuracy with min assist verbal cues for error awareness.  Pt was able to complete functional math calculations from verbally presented word problems for 50% accuracy with min assist verbal cues for working memory.  Pt did initiate functional use of therapy schedule to facilitate recall of daily information.  He also recalled route to room from [place] treatment room independently, even with therapist generated distractions.  Pt was left  in room in wheelchair with call bell within reach.  Continue per current plan of care.      Function:  Eating Eating                Cognition Comprehension Comprehension assist level: Understands complex 90% of the time/cues 10% of the time  Expression   Expression assist level: Expresses complex 90% of the time/cues < 10% of the time  Social Interaction Social Interaction assist level: Interacts appropriately with others with medication or extra time (anti-anxiety, antidepressant).  Problem Solving Problem solving assist level: Solves basic 90% of the time/requires cueing < 10% of the time  Memory Memory assist level: Recognizes or recalls 90% of the time/requires cueing < 10% of the time    Pain Pain Assessment Pain Assessment: No/denies pain  Therapy/Group: Individual Therapy  Josseline Reddin, Selinda Orion 11/26/2015, 12:41 PM

## 2015-11-26 NOTE — Progress Notes (Signed)
[facility]YSICAL [facility]ICINE & RE[facility]ABILITATION     PROGRESS NOTE  Subjective/Complaints:  Patient sitting up in bed this morning finishing his breakfast. He really enjoys his [place] ice. He notes he had a good bowel movement yesterday.  ROS: Denies CP, SOB, nausea, vomiting, diarrhea.  Objective: Vital Signs: Blood pressure 130/62, pulse 64, temperature 97.5 F (36.4 C), temperature source Oral, resp. rate 20, height 5' 5"  (1.651 m), weight 76.975 kg (169 lb 11.2 oz), SpO2 99 %. Dg Abd 1 View  11/26/2015  CLINICAL DATA:  75 year old male with nausea. Bowel movement yesterday. Initial encounter. EXAM: ABDOMEN - 1 VIEW COMPARISON:  11/25/2015 portable abdomen film. FINDINGS: Supine KUB type view of the abdomen at 0835 hours. Stable and normal bowel gas pattern. Visualized abdominal and pelvic visceral contours are within normal limits. Multilevel degenerative changes in the lumbar spine with mild dextro convex scoliosis. No acute osseous abnormality identified. No definite pneumoperitoneum on this supine view. The lung bases are not included. Calcified arterial atherosclerosis in the pelvis. IMPRESSION: Normal bowel gas pattern.  No acute radiographic findings. Electronically Signed   By: Genevie Ann M.D.   On: 11/26/2015 08:53   Dg Abd Portable 1v  11/25/2015  CLINICAL DATA:  Nausea and vomiting. EXAM: PORTABLE ABDOMEN - 1 VIEW COMPARISON:  None. FINDINGS: The central abdomen is completely gasless, with no appreciable gas within the small bowel. Mild-to-moderate stool throughout the colon. No evidence of pneumatosis or pneumoperitoneum. No radiopaque urolithiasis. Mild long segment dextrocurvature and marked degenerative changes in the visualized thoracolumbar spine. IMPRESSION: Completely gasless central abdomen, with no appreciable small bowel gas, cannot exclude distal small bowel obstruction with fluid-filled small bowel loops. If there is clinical concern for small bowel obstruction, consider  correlation with CT abdomen/pelvis with IV and oral contrast. Electronically Signed   By: Ilona Sorrel M.D.   On: 11/25/2015 11:14    Recent Labs  11/25/15 0644 11/25/15 2125  WBC 7.3 8.5  HGB 13.1 13.9  HCT 38.4* 40.4  PLT 203 233    Recent Labs  11/25/15 2125  NA 137  K 5.1  CL 99*  GLUCOSE 95  BUN 43*  CREATININE 1.67*  CALCIUM 10.2   CBG (last 3)   Recent Labs  11/25/15 1632 11/25/15 2212 11/26/15 0658  GLUCAP 105* 88 125*    Wt Readings from Last 3 Encounters:  11/26/15 76.975 kg (169 lb 11.2 oz)  11/22/15 78 kg (171 lb 15.3 oz)  11/08/15 80.196 kg (176 lb 12.8 oz)    Physical Exam:  BP 130/62 mmHg  Pulse 64  Temp(Src) 97.5 F (36.4 C) (Oral)  Resp 20  Ht 5' 5"  (1.651 m)  Wt 76.975 kg (169 lb 11.2 oz)  BMI 28.24 kg/m2  SpO2 99% Constitutional: He appears well-developed. NAD. Vital signs reviewed. HENT: Normocephalic. Atraumatic Eyes: EOM and conjunctiva are normal.  Cardiovascular: Cardiac rate control. + Murmur  Respiratory: Effort normal and breath sounds normal. No respiratory distress.  GI: Soft. Bowel sounds are normal. He exhibits no distension.  Musculoskeletal: No edema.  Neurological: He is alert and oriented Motor: 5/5 RUE/RLE LUE/LLE: 4+/5 proximal to distal. Mild ataxia left upper extremity.  Skin: Skin is warm and dry Psych: mood is pleasant and cooperative. Normal mood and affect.  Assessment/Plan: 1. Functional deficits secondary to right frontal temporal infarct which require 3+ hours per day of interdisciplinary therapy in a comprehensive inpatient rehab setting. Physiatrist is providing close team supervision and 24 hour management of active medical  problems listed below. Physiatrist and rehab team continue to assess barriers to discharge/monitor patient progress toward functional and medical goals.  Function:  Bathing Bathing position   Position: Shower  Bathing parts Body parts bathed by patient: Right arm, Left  arm, Chest, Abdomen, Front perineal area, Buttocks, Right upper leg, Left upper leg, Left lower leg, Right lower leg Body parts bathed by helper: Buttocks  Bathing assist Assist Level: Supervision or verbal cues      Upper Body Dressing/Undressing Upper body dressing   What is the patient wearing?: Pull over shirt/dress     Pull over shirt/dress - Perfomed by patient: Thread/unthread right sleeve, Thread/unthread left sleeve, Put head through opening, Pull shirt over trunk          Upper body assist Assist Level: Set up   Set up : To obtain clothing/put away  Lower Body Dressing/Undressing Lower body dressing   What is the patient wearing?: Pants, Underwear, [employer], Shoes Underwear - Performed by patient: Thread/unthread right underwear leg, Thread/unthread left underwear leg, Pull underwear up/down Underwear - Performed by helper: Pull underwear up/down Pants- Performed by patient: Thread/unthread left pants leg, Thread/unthread right pants leg, Pull pants up/down, Fasten/unfasten pants Pants- Performed by helper: Pull pants up/down     Socks - Performed by patient: Don/doff right sock, Don/doff left sock   Shoes - Performed by patient: Don/doff right shoe, Don/doff left shoe, Fasten right, Fasten left       TED Hose - Performed by patient: Don/doff left TED hose TED Hose - Performed by helper: Don/doff right TED hose  Lower body assist Assist for lower body dressing: Touching or steadying assistance (Pt > 75%)   Set up : To obtain clothing/put away  Toileting Toileting   Toileting steps completed by patient: Adjust clothing prior to toileting, Performs perineal hygiene, Adjust clothing after toileting Toileting steps completed by helper: Adjust clothing prior to toileting, Performs perineal hygiene, Adjust clothing after toileting (per Malachi Carl, NT report) Toileting Assistive Devices: Grab bar or rail  Toileting assist Assist level: Supervision or verbal cues    Transfers Chair/bed transfer   Chair/bed transfer method: Stand pivot Chair/bed transfer assist level: Supervision or verbal cues Chair/bed transfer assistive device: Armrests     Locomotion Ambulation     Max distance: 120 ft Assist level: Touching or steadying assistance (Pt > 75%)   Wheelchair   Type: Manual Max wheelchair distance: 161f Assist Level: Touching or steadying assistance (Pt > 75%)  Cognition Comprehension Comprehension assist level: Understands basic 90% of the time/cues < 10% of the time  Expression Expression assist level: Expresses basic 90% of the time/requires cueing < 10% of the time.  Social Interaction Social Interaction assist level: Interacts appropriately 90% of the time - Needs monitoring or encouragement for participation or interaction.  Problem Solving Problem solving assist level: Solves basic 90% of the time/requires cueing < 10% of the time  Memory Memory assist level: Recognizes or recalls 90% of the time/requires cueing < 10% of the time    Medical Problem List and Plan: 1. Left hemiparesthesias with balance deficits mild cognitive impairment secondary to right frontoparietal infarct after carotid angiogram. Follow-up 4 weeks outpatient vascular surgery to consider carotid enterectomy  Continue CIR 2. DVT Prophylaxis/Anticoagulation: Subcutaneous Lovenox. Monitor platelet counts and any signs of bleeding 3. Pain Management: Oxycodone as needed -kpad for left neck, ROM with therapy 4. Diabetes mellitus with peripheral neuropathy. Glucophage 1000 mg twice a day. Check blood sugars before meals  and at bedtime. Diabetic teaching 5. Neuropsych: This patient is capable of making decisions on his own behalf. 6. Skin/Wound Care: Routine skin checks 7. Fluids/Electrolytes/Nutrition: Routine I&O's  Mild hyponatremia: 137 on 5/8  Will continue to monitor 8. Hypertension. Norvasc 10 mg daily, hydrochlorothiazide 25 mg daily, lisinopril 40  mg daily, Toprol-XL 100 mg daily. Monitor with increased activity 9. Diastolic congestive heart failure. Monitor for any signs of fluid overload. Weigh patient daily 10.CRI. Baseline creatinine 1.40-1.48.   Creatinine 1.67 on 5/8  Will encourage by mouth fluid intake 11. Hyperlipidemia. Pravachol 12. Leukocytosis: Resolved  WBCs 8.5 on 5/8  Afebrile, no signs and symptoms of infection.  Will continue to monitor 13. Constipation  Abdominal x-ray reviewed from 5/9-normal   Improving  LOS (Days) 4 A FACE TO FACE EVALUATION WAS PERFORMED  Ankit Lorie Phenix 11/26/2015 9:13 AM

## 2015-11-26 NOTE — Progress Notes (Signed)
Physical Therapy Session Note  Patient Details  Name: Evan Stanley MRN: [id number] Date of Birth: [birth year]  Today's Date: 11/26/2015 PT Individual Time: 1301-1415 PT Individual Time Calculation (min): 74 min   Short Term Goals: Week 1:  PT Short Term Goal 1 (Week 1): Patient will perform Stand/sqaut pivot with min A.  PT Short Term Goal 2 (Week 1): Patient will ambulate 13f with min A with LRAD PT Short Term Goal 3 (Week 1): Patient will performed bed mobility with supervision A.  PT Short Term Goal 4 (Week 1): Patient will ascend/descent 4 steps with min A from PT.   Skilled Therapeutic Interventions/Progress Updates:    Pt received in recliner reporting he has not had lunch yet but agreeable to PT after eating graham cracker. PT donned L foot up brace & discussed d/c plans with pt. Pt reports his wife plans to take time off of work and provide 24 hr care, in addition to having a son that can assist. Pt completed sit>stand transfer with supervision & ambulated 120 ft with RW & Min A. Pt demonstrates L [place]burg gait & toe drag LLE, with these becoming more dominant as pt fatigues. On mat table pt performed sidelying L clamshells with orange theraband, bridging with adductor hold, and LLE single leg bridging with adductor hold. Pt with difficulty coordinating movements for single leg bridging, requiring maximum multimodal cuing for technique and minimal assistance at LLE to prevent pt from falling to L. Pt completed berg balance test & scored 35/56; therapist educated on interpretation of score & need to call for assistance while in CIR. Patient demonstrates increased fall risk as noted by score of 35/56 on Berg Balance Scale.  (<36= high risk for falls, close to 100%; 37-45 significant >80%; 46-51 moderate >50%; 52-55 lower >25%). Stair training completed for LLE strengthening by having pt ascend steps leading with LLE & descend leading with RLE to increase weight bearing on LLE. Pt able to  negotiate 4 steps x 3 consecutive trials with B rails & min A. Gait training x 120 ft back to room with RW & Min A with therapist providing verbal cuing to ambulate within base of RW. Pt with more normalized gait pattern initially but then regresses to more trendenlberg gait with fatigue. At end of session pt left in recliner with all needs within reach & set up with lunch tray.    Therapy Documentation Precautions:  Precautions Precautions: Fall Restrictions Weight Bearing Restrictions: No  Pain: Pain Assessment Pain Assessment: 0-10 Pain Score: 5  Pain Location: Shoulder Pain Orientation: Left Pain Intervention(s): Repositioned;Ambulation/increased activity (denied pain meds)  Balance: Balance Balance Assessed: Yes Standardized Balance Assessment Standardized Balance Assessment: Berg Balance Test Berg Balance Test Sit to Stand: Able to stand without using hands and stabilize independently Standing Unsupported: Able to stand safely 2 minutes Sitting with Back Unsupported but Feet Supported on Floor or Stool: Able to sit safely and securely 2 minutes Stand to Sit: Sits independently, has uncontrolled descent Transfers: Able to transfer with verbal cueing and /or supervision Standing Unsupported with Eyes Closed: Able to stand 10 seconds safely Standing Ubsupported with Feet Together: Able to place feet together independently and stand 1 minute safely From Standing, Reach Forward with Outstretched Arm: Can reach forward >12 cm safely (5") From Standing Position, Pick up Object from Floor: Able to pick up shoe, needs supervision From Standing Position, Turn to Look Behind Over each Shoulder: Looks behind from both sides and weight shifts well Turn 360  Degrees: Needs assistance while turning Standing Unsupported, Alternately Place Feet on Step/Stool: Needs assistance to keep from falling or unable to try Standing Unsupported, One Foot in Front: Able to take small step independently  and hold 30 seconds Standing on One Leg: Unable to try or needs assist to prevent fall Total Score: 35   See Function Navigator for Current Functional Status.   Therapy/Group: Individual Therapy  Waunita Schooner 11/26/2015, 1:21 PM

## 2015-11-27 ENCOUNTER — Ambulatory Visit: Payer: Medicare Other | Admitting: Nurse Practitioner

## 2015-11-27 ENCOUNTER — Inpatient Hospital Stay (HOSPITAL_COMMUNITY): Payer: BLUE CROSS/BLUE SHIELD | Admitting: Physical Therapy

## 2015-11-27 ENCOUNTER — Inpatient Hospital Stay (HOSPITAL_COMMUNITY): Payer: Medicare Other | Admitting: Physical Therapy

## 2015-11-27 ENCOUNTER — Inpatient Hospital Stay (HOSPITAL_COMMUNITY): Payer: BLUE CROSS/BLUE SHIELD | Admitting: Speech Pathology

## 2015-11-27 ENCOUNTER — Inpatient Hospital Stay (HOSPITAL_COMMUNITY): Payer: Medicare Other | Admitting: Occupational Therapy

## 2015-11-27 ENCOUNTER — Inpatient Hospital Stay (HOSPITAL_COMMUNITY): Payer: Medicare Other | Admitting: Speech Pathology

## 2015-11-27 LAB — GLUCOSE, CAPILLARY
GLUCOSE-CAPILLARY: 143 mg/dL — AB (ref 65–99)
Glucose-Capillary: 116 mg/dL — ABNORMAL HIGH (ref 65–99)
Glucose-Capillary: 145 mg/dL — ABNORMAL HIGH (ref 65–99)
Glucose-Capillary: 119 mg/dL — ABNORMAL HIGH (ref 65–99)

## 2015-11-27 NOTE — Progress Notes (Signed)
Occupational Therapy Session Note  Patient Details  Name: Evan Stanley MRN: [id number] Date of Birth: Sep 17, 1940  Today's Date: 11/27/2015 OT Individual Time:  - 10:00-11:00 am (60 min)      Short Term Goals: Week 1:  OT Short Term Goal 1 (Week 1): Pt will complete toilet transfers with supervision with LRAD OT Short Term Goal 2 (Week 1): Pt will complete walk-in shower transfers with min assist with LRAD OT Short Term Goal 3 (Week 1): Pt will complete LB dressing with supervision OT Short Term Goal 4 (Week 1): Pt will complete toileting tasks with supervision  Skilled Therapeutic Interventions/Progress Updates:    1:1 Self care retraining at shower level.  Pt coming out of bathroom with NT when arrived. Pt still wearing trial AFO for left LE. Pt participated in grooming tasks at the sink in standing with min cues for sustained hip and knee extension of left LE.  Pt ambulated around room with RW to obtain clothing for dressing. Pt requires min to mod VC for safety with RW during functional tasks. Pt demonstrates decr intellectual awareness and decr ability to see how his deficits lead to a high fall risk.  Pt requires min A for thoroughness after a BM with toileting (as his underwear was soiled). Found a hard round spot on pt's bottom- alerted RN to be aware and to address.  Pt dressed sit to stand with close supervision. Pt requires A to don shoes with AFO on his left LE.   Therapy Documentation Precautions:  Precautions Precautions: Fall Restrictions Weight Bearing Restrictions: No Pain: No reports of pain in session  See Function Navigator for Current Functional Status.   Therapy/Group: Individual Therapy  Andoni, Busch [facility] 11/27/2015, 6:40 PM

## 2015-11-27 NOTE — Patient Care Conference (Signed)
Inpatient RehabilitationTeam Conference and Plan of Care Update Date: 11/27/2015   Time: 2:10 PM    Patient Name: Evan Stanley      Medical Record Number: [id number]  Date of Birth: [birth year] Sex: Male         Room/Bed: 4W17C/4W17C-01 Payor Info: Payor: [place] / Plan: BCBS OTHER / Product Type: *No Product type* /    Admitting Diagnosis: CVA  Admit Date/Time:  11/22/2015  4:57 PM Admission Comments: No comment available   Primary Diagnosis:  <principal problem not specified> Principal Problem: <principal problem not specified>  Patient Active Problem List   Diagnosis Date Noted  . Slow transit constipation   . Hyponatremia   . Chronic renal insufficiency   . AKI (acute kidney injury) ([place])   . Leukocytosis   . Type 2 diabetes mellitus with peripheral neuropathy (HCC)   . CVA (cerebral infarction) 11/22/2015  . Acute CVA (cerebrovascular accident) ([place]) 11/19/2015  . Carotid artery disease ([place]) 11/18/2015  . Carotid artery occlusion without infarction 10/26/2014  . Carotid stenosis 10/26/2014  . Status post aortic valve replacement with bioprosthetic valve 07/05/2013  . Hyperlipidemia 07/05/2013  . Essential hypertension 07/05/2013  . CAD (coronary artery disease)   . DM (diabetes mellitus) [facility])     Expected Discharge Date: Expected Discharge Date: 12/04/15  Team Members Present: Physician leading conference: Dr. Delice Lesch Social Worker Present: Ovidio Kin, LCSW Nurse Present: Dorien Chihuahua, RN PT Present: Jorge Mandril, PT;Other (comment) Lavone Nian & Rory Percy) OT Present: Willeen Cass, OT SLP Present: Windell Moulding, SLP PPS Coordinator present : Daiva Nakayama, RN, CRRN     Current Status/Progress Goal Weekly Team Focus  Medical   Left hemiparesthesias with balance deficits mild cognitive impairment secondary to right frontoparietal infarct after carotid angiogram.   Improve safety, mobility  See above   Bowel/Bladder   Continent of  bowel and bladder; LBM 5/8  Mod I  Assess and treat for constipation as needed   Swallow/Nutrition/ Hydration   tolerating CM diet without problems         ADL's   Supervision for BADL and safety awareness, Min A for functional mobility and transfers  Overall Mod I for BADL and transfers, supervision for dynamic standing balance and mobility (downgraded d/t dec awareness)  Improved safety awareness, adapted bathing/dressing, dynamic standing balance, functional moblity using RW   Mobility   Min A for ambulation with RW, LLE weakness & toe drag when ambulating, steady A for transfers, Mod A stair negotiation  Min A stair negotiation, supervision for ambulation & standing balance  gait training with RW & trial L foot up brace, LLE strengthening, balance, stair training   Communication             Safety/Cognition/ Behavioral Observations            Pain   Denies pain  < 3  Assess and treat for pain q shift and prn   Skin   Red spots to B thighs- not itchy- has happened before  No skin breakdown or infection with mod I assist  Assess skin q shift and prn      *See Care Plan and progress notes for long and short-term goals.  Barriers to Discharge: DM, hyponatremia, CRI, Constipation    Possible Resolutions to Barriers:  Optimize bowel meds, optimize DM meds, follow labs    Discharge Planning/Teaching Needs:  Home with wife who does work but plans to take some time off to provide  care to pt through the transition      Team Discussion:  Goals supervision for safety due to not good awareness of his deficits.Pain on left side of neck-MD to check out along with the spot on his sacrum. AFO ordered does better with this. Working on balance issues.  Revisions to Treatment Plan:  Downgraded goals to supervision level due to safety   Continued Need for Acute Rehabilitation Level of Care: The patient requires daily medical management by a physician with specialized training in physical  medicine and rehabilitation for the following conditions: Daily direction of a multidisciplinary physical rehabilitation program to ensure safe treatment while eliciting the highest outcome that is of practical value to the patient.: Yes Daily medical management of patient stability for increased activity during participation in an intensive rehabilitation regime.: Yes Daily analysis of laboratory values and/or radiology reports with any subsequent need for medication adjustment of medical intervention for : Neurological problems;Diabetes problems;Renal problems  Minnah Llamas, [place] Rhyme 11/27/2015, 3:21 PM

## 2015-11-27 NOTE — Progress Notes (Signed)
Social Work Patient ID: Evan Stanley, male   DOB: [birth year], 75 y.o.   MRN: [id number] Met with pt and spoke with wife to discuss team conference goals-supervision level and discharge 5/17.  Wife plans to take time off work to be with pt and provide supervision level. Will have her come in Tuesday to do family education  Prior to discharge on Wed. Pt is agreeable to this plan.

## 2015-11-27 NOTE — Progress Notes (Signed)
Speech Language Pathology Daily Session Note  Patient Details  Name: Evan Stanley MRN: [id number] Date of Birth: [birth year]  Today's Date: 11/27/2015 SLP Individual Time: 72-1445 SLP Individual Time Calculation (min): 60 min  Short Term Goals: Week 1: SLP Short Term Goal 1 (Week 1): Patient will complete complex problem solving for mildly complex tasks with Mod A verbal cues and 90% accuracy.  SLP Short Term Goal 2 (Week 1): Patient will demonstrate anticipatory awareness and identify 3 tasks that he can partiicpate in at home safely with Mod A verbal cues.  SLP Short Term Goal 3 (Week 1): Patient will utilize external memory aids to recall new, daily information with Mod A verbal cues.  SLP Short Term Goal 4 (Week 1): Patient will complete abstract reasoning with Mod A verbal cues and 90% accuracy.   Skilled Therapeutic Interventions: Skilled treatment session focused on addressing cognition goals. Patient participated in a new, semi-complex learning task that required him recall procedures.  SLP facilitated session by providing Mod assist question cues to utilize self-talk, repetition, and external aids to recall new information.  Continue with current plan of care.   Function:  Cognition Comprehension Comprehension assist level: Understands complex 90% of the time/cues 10% of the time  Expression   Expression assist level: Expresses complex 90% of the time/cues < 10% of the time  Social Interaction Social Interaction assist level: Interacts appropriately with others with medication or extra time (anti-anxiety, antidepressant).  Problem Solving Problem solving assist level: Solves basic 90% of the time/requires cueing < 10% of the time  Memory Memory assist level: Recognizes or recalls 75 - 89% of the time/requires cueing 10 - 24% of the time    Pain Pain Assessment Pain Assessment: No/denies pain  Therapy/Group: Individual Therapy  Carmelia Roller.,  CCC-SLP [phone]  [place] 11/27/2015, 9:30 PM

## 2015-11-27 NOTE — Plan of Care (Signed)
Problem: RH Bed Mobility Goal: LTG Patient will perform bed mobility with assist (PT) LTG: Patient will perform bed mobility with assistance, with/without cues (PT). Without hospital bed features     Problem: RH Car Transfers Goal: LTG Patient will perform car transfers with assist (PT) LTG: Patient will perform car transfers with assistance (PT). With LRAD

## 2015-11-27 NOTE — Progress Notes (Signed)
Physical Therapy Session Note  Patient Details  Name: Evan Stanley MRN: [id number] Date of Birth: [birth year]  Today's Date: 11/27/2015 PT Individual Time: 3817-7116 PT Individual Time Calculation (min): 27 min   Short Term Goals: Week 1:  PT Short Term Goal 1 (Week 1): Patient will perform Stand/sqaut pivot with min A.  PT Short Term Goal 2 (Week 1): Patient will ambulate 157f with min A with LRAD PT Short Term Goal 3 (Week 1): Patient will performed bed mobility with supervision A.  PT Short Term Goal 4 (Week 1): Patient will ascend/descent 4 steps with min A from PT.   Skilled Therapeutic Interventions/Progress Updates:    Pt received in w/c & agreeable to PT. Pt reported 5-6/10 L neck/shoudler pain that he has had since admission & noted that it has prevented him from sleeping at night. Educated pt to ask for pain medication when needed but pt states he does not wish to take tylenol as it makes his stomach hurt & also reports that he has tried applying heat but that does not help. PT discussed this with RN who reports pt refused heat pack this AM. Pt self propelled w/c x 50 ft with Min A as pt continued to veer to L & run into wall even with verbal cuing from PT for steering. PT transported pt to BEdnapt utilized dynavision in standing without BUE support, reaching for lights with LUE only. Pt with poor peripheral vision & L inattention, requiring extra reaction time in lower & L quadrants. Pt reports this is due to first stroke that caused him to have vision changes in L eye. Pt becoming agitated with therapist asking him to perform various activities, stating that he would be better if he went home. Therapist educated pt on quality of therapy in this setting, as well as need to address deficits [facility] but pt did not seem pleased with education. Pt self propelled w/c 100 ft back to room with Min A for linear path instead of veering to L. Therapist applied  heat pack to pt's left neck with single washcloth layer in between. Pt left in w/c with all needs within reach.   Therapy Documentation Precautions:  Precautions Precautions: Fall Restrictions Weight Bearing Restrictions: No  Pain: Pain Assessment Pain Assessment: 0-10 Pain Score: 5  Pain Location: Neck Pain Orientation: Left Pain Intervention(s): Heat applied;RN made aware;Repositioned   See Function Navigator for Current Functional Status.   Therapy/Group: Individual Therapy  VWaunita Schooner5/04/2016, 3:12 PM

## 2015-11-27 NOTE — Progress Notes (Signed)
Physical Therapy Session Note  Patient Details  Name: Evan Stanley MRN: [id number] Date of Birth: 05/01/1941  Today's Date: 11/27/2015 PT Individual Time: 0800-0900 PT Individual Time Calculation (min): 60 min   Short Term Goals: Week 1:  PT Short Term Goal 1 (Week 1): Patient will perform Stand/sqaut pivot with min A.  PT Short Term Goal 2 (Week 1): Patient will ambulate 165f with min A with LRAD PT Short Term Goal 3 (Week 1): Patient will performed bed mobility with supervision A.  PT Short Term Goal 4 (Week 1): Patient will ascend/descent 4 steps with min A from PT.   Skilled Therapeutic Interventions/Progress Updates:  [facility]. Patient able to don socks/shoes with increased time without Assist from PT.   Gait training for 1553fwith RW to gym with noted  L foot drag and LLE trendelenburg gait pattern that increased with distance and fatigue. Min A from PT and mod cues for improved hip and knee flexion for foot clearance. As well as min A for improved pelvic rotation and glute med activation.    PT assessed PT for LLE AFO.  Gait speed measured with average of 3 trials without AFO: 0.2665m Gait speed measured with average of 3 trials with LLE FitKit AFO:  0.81m17mPatient noted to have improved foot clearance and mild decrease in trendelenburg with LLF AFO compared to without AFO. Patient also noted to have improved knee control with gait while using AFO.  Patient performed gait with AFO for 150ft28fbathroom for bowl movement. Supervision A for toilet transfer standing at sink for hand hygiene with supervision A.   PT instructed patient in variable gait training with LLE AFO including Side stepping in parallel bars 10ft 14fin each direction and retro-gait 4 x 10ft w53fUE support on parallel bars. Min A for improved posture and improved step pattern as well as increase control of LLE.  Gait back to room for 150ft wi16fLE AFO and RW with min A  from PT. Patient noted to have improved foot clearance for first 100ft, th34fequired mod cueing for improved hip/knee flexion to clear foot as well as min A for weight shifting to help engage LLE hip extension.   Patient left sitting in recliner with call bell within reach.         Therapy Documentation Precautions:  Precautions Precautions: Fall Restrictions Weight Bearing Restrictions: No Pain: Pain Assessment Pain Assessment: No/denies pain Pain Score: 0-No pain  See Function Navigator for Current Functional Status.   Therapy/Group: Individual Therapy  Tarnisha Kachmar E Lorie Phenix7, 9:56 AM

## 2015-11-27 NOTE — Progress Notes (Signed)
[place] PHYSICAL MEDICINE & REHABILITATION     PROGRESS NOTE  Subjective/Complaints:  Patient sitting up in his recliner this morning watching TV. He states he does not sleep well in the hospital because of the bed. He was offered sleeping medication, but states that he does not want to use anything to help him sleep.  ROS: + Insomnia. Denies CP, SOB, nausea, vomiting, diarrhea.  Objective: Vital Signs: Blood pressure 125/68, pulse 68, temperature 98.1 F (36.7 C), temperature source Oral, resp. rate 20, height 5' 5"  (1.651 m), weight 77 kg (169 lb 12.1 oz), SpO2 98 %. Dg Abd 1 View  11/26/2015  CLINICAL DATA:  75 year old male with nausea. Bowel movement yesterday. Initial encounter. EXAM: ABDOMEN - 1 VIEW COMPARISON:  11/25/2015 portable abdomen film. FINDINGS: Supine KUB type view of the abdomen at 0835 hours. Stable and normal bowel gas pattern. Visualized abdominal and pelvic visceral contours are within normal limits. Multilevel degenerative changes in the lumbar spine with mild dextro convex scoliosis. No acute osseous abnormality identified. No definite pneumoperitoneum on this supine view. The lung bases are not included. Calcified arterial atherosclerosis in the pelvis. IMPRESSION: Normal bowel gas pattern.  No acute radiographic findings. Electronically Signed   By: Genevie Ann M.D.   On: 11/26/2015 08:53   Dg Abd Portable 1v  11/25/2015  CLINICAL DATA:  Nausea and vomiting. EXAM: PORTABLE ABDOMEN - 1 VIEW COMPARISON:  None. FINDINGS: The central abdomen is completely gasless, with no appreciable gas within the small bowel. Mild-to-moderate stool throughout the colon. No evidence of pneumatosis or pneumoperitoneum. No radiopaque urolithiasis. Mild long segment dextrocurvature and marked degenerative changes in the visualized thoracolumbar spine. IMPRESSION: Completely gasless central abdomen, with no appreciable small bowel gas, cannot exclude distal small bowel obstruction with fluid-filled  small bowel loops. If there is clinical concern for small bowel obstruction, consider correlation with CT abdomen/pelvis with IV and oral contrast. Electronically Signed   By: Ilona Sorrel M.D.   On: 11/25/2015 11:14    Recent Labs  11/25/15 0644 11/25/15 2125  WBC 7.3 8.5  HGB 13.1 13.9  HCT 38.4* 40.4  PLT 203 233    Recent Labs  11/25/15 2125  NA 137  K 5.1  CL 99*  GLUCOSE 95  BUN 43*  CREATININE 1.67*  CALCIUM 10.2   CBG (last 3)   Recent Labs  11/26/15 1647 11/26/15 2148 11/27/15 0703  GLUCAP 136* 116* 116*    Wt Readings from Last 3 Encounters:  11/27/15 77 kg (169 lb 12.1 oz)  11/22/15 78 kg (171 lb 15.3 oz)  11/08/15 80.196 kg (176 lb 12.8 oz)    Physical Exam:  BP 125/68 mmHg  Pulse 68  Temp(Src) 98.1 F (36.7 C) (Oral)  Resp 20  Ht 5' 5"  (1.651 m)  Wt 77 kg (169 lb 12.1 oz)  BMI 28.25 kg/m2  SpO2 98% Constitutional: He appears well-developed. NAD. Vital signs reviewed. HENT: Normocephalic. Atraumatic Eyes: EOM and conjunctiva are normal.  Cardiovascular: Cardiac rate control. + Murmur  Respiratory: Effort normal and breath sounds normal. No respiratory distress.  GI: Soft. Bowel sounds are normal. He exhibits no distension.  Musculoskeletal: No edema.  Neurological: He is alert and oriented Motor: 5/5 RUE/RLE LUE/LLE: 4+/5 proximal to distal. Mild ataxia left upper extremity.  Skin: Skin is warm and dry Psych: mood is pleasant and cooperative. Normal mood and affect.  Assessment/Plan: 1. Functional deficits secondary to right frontal temporal infarct which require 3+ hours per day of interdisciplinary  therapy in a comprehensive inpatient rehab setting. Physiatrist is providing close team supervision and 24 hour management of active medical problems listed below. Physiatrist and rehab team continue to assess barriers to discharge/monitor patient progress toward functional and medical goals.  Function:  Bathing Bathing position    Position: Shower  Bathing parts Body parts bathed by patient: Abdomen, Front perineal area, Right upper leg Body parts bathed by helper: Left arm, Left lower leg  Bathing assist Assist Level: Supervision or verbal cues      Upper Body Dressing/Undressing Upper body dressing   What is the patient wearing?: Pull over shirt/dress     Pull over shirt/dress - Perfomed by patient: Thread/unthread right sleeve, Thread/unthread left sleeve, Put head through opening, Pull shirt over trunk          Upper body assist Assist Level: Set up   Set up : To obtain clothing/put away  Lower Body Dressing/Undressing Lower body dressing   What is the patient wearing?: Pants, Underwear, [employer], Shoes Underwear - Performed by patient: Thread/unthread right underwear leg, Thread/unthread left underwear leg, Pull underwear up/down Underwear - Performed by helper: Pull underwear up/down Pants- Performed by patient: Thread/unthread left pants leg, Thread/unthread right pants leg, Pull pants up/down, Fasten/unfasten pants Pants- Performed by helper: Pull pants up/down     Socks - Performed by patient: Don/doff right sock, Don/doff left sock   Shoes - Performed by patient: Don/doff right shoe, Don/doff left shoe, Fasten right, Fasten left       TED Hose - Performed by patient: Don/doff left TED hose, Don/doff right TED hose TED Hose - Performed by helper: Don/doff right TED hose  Lower body assist Assist for lower body dressing: Supervision or verbal cues   Set up : To obtain clothing/put away  Toileting Toileting   Toileting steps completed by patient: Adjust clothing prior to toileting, Performs perineal hygiene, Adjust clothing after toileting Toileting steps completed by helper: Adjust clothing prior to toileting, Performs perineal hygiene, Adjust clothing after toileting (per Malachi Carl, NT report) Toileting Assistive Devices: Grab bar or rail  Toileting assist Assist level: Supervision  or verbal cues   Transfers Chair/bed transfer   Chair/bed transfer method: Stand pivot Chair/bed transfer assist level: Supervision or verbal cues Chair/bed transfer assistive device: Armrests     Locomotion Ambulation     Max distance: 120 ft Assist level: Touching or steadying assistance (Pt > 75%)   Wheelchair   Type: Manual Max wheelchair distance: 131f Assist Level: Touching or steadying assistance (Pt > 75%)  Cognition Comprehension Comprehension assist level: Understands complex 90% of the time/cues 10% of the time  Expression Expression assist level: Expresses complex 90% of the time/cues < 10% of the time  Social Interaction Social Interaction assist level: Interacts appropriately with others with medication or extra time (anti-anxiety, antidepressant).  Problem Solving Problem solving assist level: Solves basic 90% of the time/requires cueing < 10% of the time  Memory Memory assist level: Recognizes or recalls 90% of the time/requires cueing < 10% of the time    Medical Problem List and Plan: 1. Left hemiparesthesias with balance deficits mild cognitive impairment secondary to right frontoparietal infarct after carotid angiogram. Follow-up 4 weeks outpatient vascular surgery to consider carotid enterectomy  Continue CIR 2. DVT Prophylaxis/Anticoagulation: Subcutaneous Lovenox. Monitor platelet counts and any signs of bleeding 3. Pain Management: Oxycodone as needed -kpad for left neck, ROM with therapy 4. Diabetes mellitus with peripheral neuropathy. Glucophage 1000 mg twice a day. Check  blood sugars before meals and at bedtime. Diabetic teaching  Fairly well controlled at present 5. Neuropsych: This patient is capable of making decisions on his own behalf. 6. Skin/Wound Care: Routine skin checks 7. Fluids/Electrolytes/Nutrition: Routine I&O's  Mild hyponatremia: 137 on 5/8  Will continue to monitor 8. Hypertension. Norvasc 10 mg daily,  hydrochlorothiazide 25 mg daily, lisinopril 40 mg daily, Toprol-XL 100 mg daily. Monitor with increased activity 9. Diastolic congestive heart failure. Monitor for any signs of fluid overload. Weigh patient daily 10.CRI. Baseline creatinine 1.40-1.48.   Creatinine 1.67 on 5/8  Will encourage by mouth fluid intake 11. Hyperlipidemia. Pravachol 12. Leukocytosis: Resolved  WBCs 8.5 on 5/8  Afebrile, no signs and symptoms of infection.  Will continue to monitor 13. Constipation  Abdominal x-ray reviewed from 5/9-normal   Improving  LOS (Days) 5 A FACE TO FACE EVALUATION WAS PERFORMED  Myrl Lazarus Lorie Phenix 11/27/2015 9:53 AM

## 2015-11-28 ENCOUNTER — Inpatient Hospital Stay (HOSPITAL_COMMUNITY): Payer: BLUE CROSS/BLUE SHIELD

## 2015-11-28 ENCOUNTER — Inpatient Hospital Stay (HOSPITAL_COMMUNITY): Payer: Medicare Other | Admitting: Physical Therapy

## 2015-11-28 ENCOUNTER — Inpatient Hospital Stay (HOSPITAL_COMMUNITY): Payer: Medicare Other | Admitting: Occupational Therapy

## 2015-11-28 ENCOUNTER — Inpatient Hospital Stay (HOSPITAL_COMMUNITY): Payer: BLUE CROSS/BLUE SHIELD | Admitting: Speech Pathology

## 2015-11-28 DIAGNOSIS — L739 Follicular disorder, unspecified: Secondary | ICD-10-CM

## 2015-11-28 LAB — GLUCOSE, CAPILLARY
GLUCOSE-CAPILLARY: 137 mg/dL — AB (ref 65–99)
GLUCOSE-CAPILLARY: 120 mg/dL — AB (ref 65–99)
Glucose-Capillary: 127 mg/dL — ABNORMAL HIGH (ref 65–99)
Glucose-Capillary: 113 mg/dL — ABNORMAL HIGH (ref 65–99)

## 2015-11-28 NOTE — Progress Notes (Signed)
Physical Therapy Session Note  Patient Details  Name: Evan Stanley MRN: [id number] Date of Birth: 11/11/1940  Today's Date: 11/28/2015 PT Individual Time: 0900-1000 PT Individual Time Calculation (min): 60 min   Short Term Goals: Week 1:  PT Short Term Goal 1 (Week 1): Patient will perform Stand/sqaut pivot with min A.  PT Short Term Goal 2 (Week 1): Patient will ambulate 138f with min A with LRAD PT Short Term Goal 3 (Week 1): Patient will performed bed mobility with supervision A.  PT Short Term Goal 4 (Week 1): Patient will ascend/descent 4 steps with min A from PT.   Skilled Therapeutic Interventions/Progress Updates:    [facility]. Patient performed facial hygiene at [facility]. PT assisted patient to don ted hose and LLE AFO. Gait trianing for 1535fwith RW and min A to rehab gym. Patient noted to only  have 6 episodes of foot drag and was able to correct 50% of the time following foot drag, but patient maintains significant trendelenberg gait that becomes more exaggerated with increased distance. Gait training also performed for 15037fack to room with min-mod A for improved weight shift and activation of LLE hip extensors/ER. Patient noted to have increased LLE foot drage for last 33f20fe to increased fatigue, with only minor change in gait pattern with cues.   Variable gait training: Side stepping L and R x 30ft42fh rail in hall and min-mod A for improved trunk stability. Reto gait x 30ft 43f RW and min-mod A for increased hip extension and improved hip stablization.   Balance training.   Lateral reach L and R to place horseshoe on basketball goal x 8 BUE. Min A from PT and tacile cues for improved trunk activtion Standing marches in [place]ettlersvillecm/sec 1 min x 2 PT provided min-mod A to mintain erect posture and improved LLE gluteal activation.    Throughout treatment patient performed sit<>stand transfer x 8  with supervision A and min A x 1, with RW and cues for UE placement from PT.   Patient returned to room and left sitting in recliner with call bell within reach.     Therapy Documentation Precautions:  Precautions Precautions: Fall Restrictions Weight Bearing Restrictions: No General:   Vital Signs: Therapy Vitals Pulse Rate: 72 BP: 118/62 mmHg Pain: Pain Assessment Pain Assessment: No/denies pain Pain Score: 0-No pain  See Function Navigator for Current Functional Status.   Therapy/Group: Individual Therapy  AustinLorie Phenix2017, 11:00 AM

## 2015-11-28 NOTE — Progress Notes (Signed)
Occupational Therapy Session Note  Patient Details  Name: Evan Stanley MRN: [id number] Date of Birth: Jul 19, 1941  Today's Date: 11/28/2015 OT Individual Time: 1000-1100 OT Individual Time Calculation (min): 60 min    Short Term Goals: Week 1:  OT Short Term Goal 1 (Week 1): Pt will complete toilet transfers with supervision with LRAD OT Short Term Goal 2 (Week 1): Pt will complete walk-in shower transfers with min assist with LRAD OT Short Term Goal 3 (Week 1): Pt will complete LB dressing with supervision OT Short Term Goal 4 (Week 1): Pt will complete toileting tasks with supervision  Skilled Therapeutic Interventions/Progress Updates:    1:1 self care retraining at shower level.  Pt with difficulty problem solving and sequencing events of this morning with if he should shower today ; requiring mod questioning cues.  Pt with decr daily recall of sequence of events yesterday requiring mod A to recall yesterday's routine of bathing at shower level and dressing. Pt with difficulty generalizing in session today.  Pt required min A for standing balance in shower and for sit to stand without use of grab bar. Discussed how he will not have a bar at home to use. Pt reported he wouldn't need one cause he will shower standing up; demonstrating decr safety awareness.  Pt continues to be a high fall risk with decr safety awareness and decr awareness of deficits and how they impact his mobility and safety. Dressed sitting on [facility] with close supervision for standing. Engaged in [place] vision in standing to address reaction time, visual scanning, selective attention to alternating attention, following multi step directions. As exercises continued pt's reaction time improved however pt missed more targets. No LOB during activity.  Therapy Documentation Precautions:  Precautions Precautions: Fall Restrictions Weight Bearing Restrictions: No General:   Vital Signs: Therapy Vitals Temp: 98.6 F (37 C) Temp  Source: Oral Pulse Rate: 72 Resp: 18 BP: 118/62 mmHg Patient Position (if appropriate): Lying Oxygen Therapy SpO2: 99 % O2 Device: Not Delivered Pain: Pain Assessment Pain Assessment: No/denies pain Pain Score: 0-No pain ADL:   Exercises:   Other Treatments:    See Function Navigator for Current Functional Status.   Therapy/Group: Individual Therapy  [name], [place] [facility] 11/28/2015, 10:14 AM

## 2015-11-28 NOTE — Progress Notes (Signed)
Orthopedic Tech Progress Note Patient Details:  Evan Stanley [birth year] [id number]  Patient ID: [name], male   DOB: January 27, 1941, 75 y.o.   MRN: [id number]   [place] Pink 11/28/2015, 9:31 AMCalled [employer] for left AFO brace.

## 2015-11-28 NOTE — Progress Notes (Signed)
Speech Language Pathology Daily Session Note  Patient Details  Name: Evan Stanley MRN: [id number] Date of Birth: 04-11-1941  Today's Date: 11/28/2015 SLP Individual Time: 1130-1200 SLP Individual Time: 6720-9198 SLP Individual Time Calculation (min): 30 minSLP Individual Time Calculation (min): 75 min   Short Term Goals: Week 1: SLP Short Term Goal 1 (Week 1): Patient will complete complex problem solving for mildly complex tasks with Mod A verbal cues and 90% accuracy.  SLP Short Term Goal 2 (Week 1): Patient will demonstrate anticipatory awareness and identify 3 tasks that he can partiicpate in at home safely with Mod A verbal cues.  SLP Short Term Goal 3 (Week 1): Patient will utilize external memory aids to recall new, daily information with Mod A verbal cues.  SLP Short Term Goal 4 (Week 1): Patient will complete abstract reasoning with Mod A verbal cues and 90% accuracy.   Skilled Therapeutic Interventions: Pt seen for 2 sessions to focus on cognitive-linguistic goals. Session 1: Pt able to recall task from previous day with 80% acc recall of details independently. Min A for 100% acc with contextual cueing and increased time. Able to complete semi-complex task focusing on working memory with occasional cueing for problem solving. Session 2: New task introduced with increased difficulty due to remand for more rapid processing and dual demand on attention and motor control. Improved accuracy noted with alleviation of motor demand. Mod I for safety precautions during functional mobility.    Function:  Eating Eating   Modified Consistency Diet: No Eating Assist Level: No help, No cues           Cognition Comprehension Comprehension assist level: Understands complex 90% of the time/cues 10% of the time  Expression   Expression assist level: Expresses complex 90% of the time/cues < 10% of the time  Social Interaction Social Interaction assist level: Interacts appropriately with  others with medication or extra time (anti-anxiety, antidepressant).  Problem Solving Problem solving assist level: Solves basic 90% of the time/requires cueing < 10% of the time  Memory Memory assist level: Recognizes or recalls 75 - 89% of the time/requires cueing 10 - 24% of the time    Pain Pain Assessment Pain Assessment: No/denies pain  Therapy/Group: Individual Therapy  Vinetta Bergamo MA, CCC-SLP 11/28/2015, 4:35 PM

## 2015-11-28 NOTE — Progress Notes (Signed)
[place] PHYSICAL MEDICINE & REHABILITATION     PROGRESS NOTE  Subjective/Complaints:  Patient lying comfortably in bed this morning. He notes that he slept better last night and he has in a long time.  ROS: Denies CP, SOB, nausea, vomiting, diarrhea.  Objective: Vital Signs: Blood pressure 118/62, pulse 72, temperature 98.6 F (37 C), temperature source Oral, resp. rate 18, height 5' 5"  (1.651 m), weight 77.157 kg (170 lb 1.6 oz), SpO2 99 %. No results found.  Recent Labs  11/25/15 2125  WBC 8.5  HGB 13.9  HCT 40.4  PLT 233    Recent Labs  11/25/15 2125  NA 137  K 5.1  CL 99*  GLUCOSE 95  BUN 43*  CREATININE 1.67*  CALCIUM 10.2   CBG (last 3)   Recent Labs  11/27/15 1711 11/27/15 2102 11/28/15 0618  GLUCAP 145* 119* 127*    Wt Readings from Last 3 Encounters:  11/28/15 77.157 kg (170 lb 1.6 oz)  11/22/15 78 kg (171 lb 15.3 oz)  11/08/15 80.196 kg (176 lb 12.8 oz)    Physical Exam:  BP 118/62 mmHg  Pulse 72  Temp(Src) 98.6 F (37 C) (Oral)  Resp 18  Ht 5' 5"  (1.651 m)  Wt 77.157 kg (170 lb 1.6 oz)  BMI 28.31 kg/m2  SpO2 99% Constitutional: He appears well-developed. NAD. Vital signs reviewed. HENT: Normocephalic. Atraumatic Eyes: EOM and conjunctiva are normal.  Cardiovascular: Cardiac rate control. + Murmur  Respiratory: Effort normal and breath sounds normal. No respiratory distress.  GI: Soft. Bowel sounds are normal. He exhibits no distension.  Musculoskeletal: No edema.  Neurological: He is alert and oriented Motor: 5/5 RUE/RLE LUE/LLE: 4+/5 proximal to distal. Mild ataxia left upper extremity.  Skin: Skin is warm and dry. Folliculitis right gluteal area Psych: mood is pleasant and cooperative. Normal mood and affect.  Assessment/Plan: 1. Functional deficits secondary to right frontal temporal infarct which require 3+ hours per day of interdisciplinary therapy in a comprehensive inpatient rehab setting. Physiatrist is providing  close team supervision and 24 hour management of active medical problems listed below. Physiatrist and rehab team continue to assess barriers to discharge/monitor patient progress toward functional and medical goals.  Function:  Bathing Bathing position   Position: Shower  Bathing parts Body parts bathed by patient: Right arm, Left arm, Chest, Abdomen, Front perineal area, Buttocks, Right upper leg, Left upper leg, Right lower leg, Left lower leg Body parts bathed by helper: Back  Bathing assist Assist Level: Supervision or verbal cues      Upper Body Dressing/Undressing Upper body dressing   What is the patient wearing?: Pull over shirt/dress     Pull over shirt/dress - Perfomed by patient: Thread/unthread right sleeve, Thread/unthread left sleeve, Put head through opening, Pull shirt over trunk          Upper body assist Assist Level: More than reasonable time   Set up : To obtain clothing/put away  Lower Body Dressing/Undressing Lower body dressing   What is the patient wearing?: Pants, Underwear, [employer], Shoes Underwear - Performed by patient: Thread/unthread right underwear leg, Thread/unthread left underwear leg, Pull underwear up/down Underwear - Performed by helper: Pull underwear up/down Pants- Performed by patient: Thread/unthread left pants leg, Thread/unthread right pants leg, Pull pants up/down, Fasten/unfasten pants Pants- Performed by helper: Pull pants up/down     Socks - Performed by patient: Don/doff right sock, Don/doff left sock   Shoes - Performed by patient: Don/doff right shoe, Fasten right,  Fasten left Shoes - Performed by helper: Don/doff left shoe (with AFO)     TED Hose - Performed by patient: Don/doff left TED hose, Don/doff right TED hose TED Hose - Performed by helper: Don/doff right TED hose  Lower body assist Assist for lower body dressing: Supervision or verbal cues   Set up : To obtain clothing/put away  Toileting Toileting    Toileting steps completed by patient: Adjust clothing prior to toileting, Performs perineal hygiene, Adjust clothing after toileting Toileting steps completed by helper: Adjust clothing prior to toileting, Performs perineal hygiene, Adjust clothing after toileting (per Malachi Carl, NT report) Toileting Assistive Devices: Grab bar or rail  Toileting assist Assist level: Supervision or verbal cues   Transfers Chair/bed transfer   Chair/bed transfer method: Stand pivot Chair/bed transfer assist level: Touching or steadying assistance (Pt > 75%) Chair/bed transfer assistive device: Armrests, Medical sales representative     Max distance: 120 ft Assist level: Touching or steadying assistance (Pt > 75%)   Wheelchair   Type: Manual Max wheelchair distance: 100 Assist Level: Touching or steadying assistance (Pt > 75%)  Cognition Comprehension Comprehension assist level: Understands complex 90% of the time/cues 10% of the time  Expression Expression assist level: Expresses complex 90% of the time/cues < 10% of the time  Social Interaction Social Interaction assist level: Interacts appropriately with others with medication or extra time (anti-anxiety, antidepressant).  Problem Solving Problem solving assist level: Solves basic 90% of the time/requires cueing < 10% of the time  Memory Memory assist level: Recognizes or recalls 75 - 89% of the time/requires cueing 10 - 24% of the time    Medical Problem List and Plan: 1. Left hemiparesthesias with balance deficits mild cognitive impairment secondary to right frontoparietal infarct after carotid angiogram. Follow-up 4 weeks outpatient vascular surgery to consider carotid enterectomy  Continue CIR 2. DVT Prophylaxis/Anticoagulation: Subcutaneous Lovenox. Monitor platelet counts and any signs of bleeding 3. Pain Management: Oxycodone as needed -kpad for left neck, ROM with therapy 4. Diabetes mellitus with peripheral  neuropathy. Glucophage 1000 mg twice a day. Check blood sugars before meals and at bedtime. Diabetic teaching  Fairly well controlled at present 5. Neuropsych: This patient is capable of making decisions on his own behalf. 6. Skin/Wound Care: Routine skin checks 7. Fluids/Electrolytes/Nutrition: Routine I&O's  Mild hyponatremia: 137 on 5/8  Labs ordered for tomorrow 8. Hypertension. Norvasc 10 mg daily, hydrochlorothiazide 25 mg daily, lisinopril 40 mg daily, Toprol-XL 100 mg daily. Monitor with increased activity 9. Diastolic congestive heart failure. Monitor for any signs of fluid overload. Weigh patient daily 10.CRI. Baseline creatinine 1.40-1.48.   Creatinine 1.67 on 5/8  Labs ordered for tomorrow  Will encourage by mouth fluid intake 11. Hyperlipidemia. Pravachol 12. Leukocytosis: Resolved  WBCs 8.5 on 5/8  Afebrile, no signs and symptoms of infection.  Will continue to monitor 13. Constipation  Abdominal x-ray reviewed from 5/9-normal   Improving 14. Insomnia  Patient does not wish to take any medications  Improving 15. Folliculitis  Right gluteal area-improving per patient  Will reevaluate and consider empiric antibiotics tomorrow.  LOS (Days) 6 A FACE TO FACE EVALUATION WAS PERFORMED  Ankit Lorie Phenix 11/28/2015 10:12 AM

## 2015-11-29 ENCOUNTER — Inpatient Hospital Stay (HOSPITAL_COMMUNITY): Payer: BLUE CROSS/BLUE SHIELD

## 2015-11-29 ENCOUNTER — Inpatient Hospital Stay (HOSPITAL_COMMUNITY): Payer: Medicare Other | Admitting: Physical Therapy

## 2015-11-29 ENCOUNTER — Inpatient Hospital Stay (HOSPITAL_COMMUNITY): Payer: BLUE CROSS/BLUE SHIELD | Admitting: Speech Pathology

## 2015-11-29 LAB — GLUCOSE, CAPILLARY
GLUCOSE-CAPILLARY: 120 mg/dL — AB (ref 65–99)
Glucose-Capillary: 132 mg/dL — ABNORMAL HIGH (ref 65–99)
Glucose-Capillary: 136 mg/dL — ABNORMAL HIGH (ref 65–99)
Glucose-Capillary: 102 mg/dL — ABNORMAL HIGH (ref 65–99)

## 2015-11-29 LAB — CREATININE, SERUM
Creatinine, Ser: 1.82 mg/dL — ABNORMAL HIGH (ref 0.61–1.24)
GFR calc Af Amer: 41 mL/min — ABNORMAL LOW (ref >60)
GFR calc non Af Amer: 35 mL/min — ABNORMAL LOW (ref >60)

## 2015-11-29 LAB — BASIC METABOLIC PANEL
Anion gap: 11 (ref 5–15)
BUN: 35 mg/dL — ABNORMAL HIGH (ref 6–20)
CO2: 25 mmol/L (ref 22–32)
Calcium: 9.3 mg/dL (ref 8.9–10.3)
Chloride: 101 mmol/L (ref 101–111)
Creatinine, Ser: 1.95 mg/dL — ABNORMAL HIGH (ref 0.61–1.24)
GFR calc Af Amer: 38 mL/min — ABNORMAL LOW (ref >60)
GFR calc non Af Amer: 33 mL/min — ABNORMAL LOW (ref >60)
Glucose, Bld: 127 mg/dL — ABNORMAL HIGH (ref 65–99)
Potassium: 4.6 mmol/L (ref 3.5–5.1)
Sodium: 137 mmol/L (ref 135–145)

## 2015-11-29 MED ORDER — METFORMIN HCL 500 MG PO TABS
500.0000 mg | ORAL_TABLET | Freq: Two times a day (BID) | ORAL | Status: DC
  Administered 2015-11-29 – 2015-12-02 (×6): 500 mg via ORAL
  Filled 2015-11-29 (×6): qty 1

## 2015-11-29 MED ORDER — CEPHALEXIN 500 MG PO CAPS
500.0000 mg | ORAL_CAPSULE | Freq: Two times a day (BID) | ORAL | Status: DC
  Administered 2015-11-29 – 2015-12-04 (×11): 500 mg via ORAL
  Filled 2015-11-29: qty 2
  Filled 2015-11-29 (×2): qty 1
  Filled 2015-11-29 (×2): qty 2
  Filled 2015-11-29 (×4): qty 1
  Filled 2015-11-29 (×4): qty 2
  Filled 2015-11-29 (×3): qty 1
  Filled 2015-11-29 (×4): qty 2
  Filled 2015-11-29 (×2): qty 1
  Filled 2015-11-29: qty 2

## 2015-11-29 NOTE — Progress Notes (Signed)
Physical Therapy Session Note  Patient Details  Name: Evan Stanley MRN: [id number] Date of Birth: 19-Apr-1941  Today's Date: 11/29/2015 PT Individual Time: 1100-1200 PT Individual Time Calculation (min): 60 min   Short Term Goals: Week 1:  PT Short Term Goal 1 (Week 1): Patient will perform Stand/sqaut pivot with min A.  PT Short Term Goal 2 (Week 1): Patient will ambulate 196f with min A with LRAD PT Short Term Goal 3 (Week 1): Patient will performed bed mobility with supervision A.  PT Short Term Goal 4 (Week 1): Patient will ascend/descent 4 steps with min A from PT.   Skilled Therapeutic Interventions/Progress Updates:    WC mobility for 1534fx 2 with Supervision A and occasional min A to avoid obstacles; constant cues for improved force on LUE to prevent veering to L., with mild adjustment in technique following instruction. Toilet transfer for bowel movement with supervision A and min cues for proper use of safety rails  Biodex weight shift Lateral x 2 and anterior/posterior x2 for each for one min. Biodex LOS x 1 minute with no UE support and min A form PT for improved pelvic alignment and weight shift over LLE.  Step up/down with LLE on 6 inch step with BUE support for improved strength and activation in LLE, min A from PT with constant cues for improved pelvic positioning. [place] Level B Side stepping for 2065f and R, forward and backwards gait at rail in hall for [id number] provided min A for gait at rail in hall as well as min A for improved weight shifting and pelvic alignment with side stepping.   Patient returned to room and left sitting in WC.   Therapy Documentation Precautions:  Precautions Precautions: Fall Restrictions Weight Bearing Restrictions: No    Pain: Pain Assessment Pain Assessment: No/denies pain  See Function Navigator for Current Functional Status.   Therapy/Group: Individual Therapy  AustLorie Phenix2/2017, 12:44 PM

## 2015-11-29 NOTE — Progress Notes (Signed)
Speech Language Pathology Daily Session Note  Patient Details  Name: Evan Stanley MRN: [id number] Date of Birth: 07-May-1941  Today's Date: 11/29/2015 SLP Individual Time: 1450-1530 SLP Individual Time Calculation (min): 40 min  Short Term Goals: Week 1: SLP Short Term Goal 1 (Week 1): Patient will complete complex problem solving for mildly complex tasks with Mod A verbal cues and 90% accuracy.  SLP Short Term Goal 2 (Week 1): Patient will demonstrate anticipatory awareness and identify 3 tasks that he can partiicpate in at home safely with Mod A verbal cues.  SLP Short Term Goal 3 (Week 1): Patient will utilize external memory aids to recall new, daily information with Mod A verbal cues.  SLP Short Term Goal 4 (Week 1): Patient will complete abstract reasoning with Mod A verbal cues and 90% accuracy.   Skilled Therapeutic Interventions:  Pt was seen for skilled ST targeting cognitive goals.  Pt seated in recliner upon arrival and requested assistance appropriately in obtaining specific items that were out of his reach stating "I would've done it before you got here but they don't want me doing that."  Pt recalled at least 3 specific details from previous AM therapy sessions with supervision question cues.  Pt returned demonstration of safe walker use with supervision question cues.  SLP facilitated the session with a novel card game targeting processing speed, functional problem solving, and working memory.  Pt was able to make category picture associations with mod I.  Pt was then able to rapidly name specific members with mod I for generative naming and supervision for recall of category names.  Pt was also able to recall 8/8 previously named category members following a 5 minute delay with supervision.  Pt was returned to room and left in wheelchair with call bell within reach.  Continue per current plan of care.    Function:  Eating Eating                 Cognition Comprehension  Comprehension assist level: Follows complex conversation/direction with no assist  Expression   Expression assist level: Expresses complex ideas: With no assist  Social Interaction Social Interaction assist level: Interacts appropriately with others - No medications needed.  Problem Solving Problem solving assist level: Solves basic 90% of the time/requires cueing < 10% of the time  Memory Memory assist level: More than reasonable amount of time    Pain Pain Assessment Pain Assessment: No/denies pain  Therapy/Group: Individual Therapy  Kemonie Cutillo, Selinda Orion 11/29/2015, 4:35 PM

## 2015-11-29 NOTE — Progress Notes (Signed)
[place] PHYSICAL MEDICINE & REHABILITATION     PROGRESS NOTE  Subjective/Complaints:  Pt sitting up in his chair, watching TV.  He notes his sleep is getting better.    ROS: Denies CP, SOB, nausea, vomiting, diarrhea.  Objective: Vital Signs: Blood pressure 124/60, pulse 72, temperature 98.5 F (36.9 C), temperature source Oral, resp. rate 18, height 5' 5"  (1.651 m), weight 73.664 kg (162 lb 6.4 oz), SpO2 98 %. Dg Abd Portable 1v  11/28/2015  CLINICAL DATA:  Recent episodes of vomiting EXAM: PORTABLE ABDOMEN - 1 VIEW COMPARISON:  Nov 26, 2015 FINDINGS: There is fairly diffuse stool throughout the colon without colonic dilatation from stool. No bowel dilatation or air-fluid level suggesting obstruction. No free air. Lung bases are clear. There is degenerative change in the lumbar spine. There are foci of atherosclerotic calcification in the pelvis. IMPRESSION: Fairly diffuse stool throughout the colon. No bowel obstruction or free air. Electronically Signed   By: Lowella Grip III M.D.   On: 11/28/2015 14:01   No results for input(s): WBC, HGB, HCT, PLT in the last 72 hours.  Recent Labs  11/29/15 0058 11/29/15 0602  NA  --  137  K  --  4.6  CL  --  101  GLUCOSE  --  127*  BUN  --  35*  CREATININE 1.82* 1.95*  CALCIUM  --  9.3   CBG (last 3)   Recent Labs  11/28/15 1647 11/28/15 2104 11/29/15 0640  GLUCAP 120* 113* 120*    Wt Readings from Last 3 Encounters:  11/29/15 73.664 kg (162 lb 6.4 oz)  11/22/15 78 kg (171 lb 15.3 oz)  11/08/15 80.196 kg (176 lb 12.8 oz)    Physical Exam:  BP 124/60 mmHg  Pulse 72  Temp(Src) 98.5 F (36.9 C) (Oral)  Resp 18  Ht 5' 5"  (1.651 m)  Wt 73.664 kg (162 lb 6.4 oz)  BMI 27.02 kg/m2  SpO2 98% Constitutional: He appears well-developed. NAD. Vital signs reviewed. HENT: Normocephalic. Atraumatic Eyes: EOM and conjunctiva are normal.  Cardiovascular: Cardiac rate control. + Murmur  Respiratory: Effort normal and breath  sounds normal. No respiratory distress.  GI: Soft. Bowel sounds are normal. He exhibits no distension.  Musculoskeletal: No edema.  Neurological: He is alert and oriented Motor: 5/5 RUE/RLE LUE/LLE: 4+/5 proximal to distal. Mild ataxia left upper extremity.  Skin: Skin is warm and dry. Folliculitis right gluteal area Psych: mood is pleasant and cooperative. Normal mood and affect.  Assessment/Plan: 1. Functional deficits secondary to right frontal temporal infarct which require 3+ hours per day of interdisciplinary therapy in a comprehensive inpatient rehab setting. Physiatrist is providing close team supervision and 24 hour management of active medical problems listed below. Physiatrist and rehab team continue to assess barriers to discharge/monitor patient progress toward functional and medical goals.  Function:  Bathing Bathing position   Position: Shower  Bathing parts Body parts bathed by patient: Right arm, Left arm, Chest, Abdomen, Front perineal area, Buttocks, Right upper leg, Left upper leg, Right lower leg, Left lower leg Body parts bathed by helper: Back  Bathing assist Assist Level: Supervision or verbal cues      Upper Body Dressing/Undressing Upper body dressing   What is the patient wearing?: Pull over shirt/dress     Pull over shirt/dress - Perfomed by patient: Thread/unthread right sleeve, Thread/unthread left sleeve, Put head through opening, Pull shirt over trunk          Upper body assist Assist  Level: More than reasonable time   Set up : To obtain clothing/put away  Lower Body Dressing/Undressing Lower body dressing   What is the patient wearing?: Pants, Underwear, [employer], Shoes Underwear - Performed by patient: Thread/unthread right underwear leg, Thread/unthread left underwear leg, Pull underwear up/down Underwear - Performed by helper: Pull underwear up/down Pants- Performed by patient: Thread/unthread left pants leg, Thread/unthread right pants  leg, Pull pants up/down, Fasten/unfasten pants Pants- Performed by helper: Pull pants up/down     Socks - Performed by patient: Don/doff right sock, Don/doff left sock   Shoes - Performed by patient: Don/doff right shoe, Fasten right, Fasten left Shoes - Performed by helper: Don/doff left shoe (with AFO)     TED Hose - Performed by patient: Don/doff left TED hose, Don/doff right TED hose TED Hose - Performed by helper: Don/doff right TED hose  Lower body assist Assist for lower body dressing: Supervision or verbal cues   Set up : To obtain clothing/put away  Toileting Toileting   Toileting steps completed by patient: Adjust clothing prior to toileting, Performs perineal hygiene, Adjust clothing after toileting Toileting steps completed by helper: Adjust clothing prior to toileting, Performs perineal hygiene, Adjust clothing after toileting (per Malachi Carl, NT report) Toileting Assistive Devices: Grab bar or rail  Toileting assist Assist level: Supervision or verbal cues   Transfers Chair/bed transfer   Chair/bed transfer method: Stand pivot Chair/bed transfer assist level: Touching or steadying assistance (Pt > 75%) Chair/bed transfer assistive device: Armrests, Medical sales representative     Max distance: 120 ft Assist level: Touching or steadying assistance (Pt > 75%)   Wheelchair   Type: Manual Max wheelchair distance: 100 Assist Level: Touching or steadying assistance (Pt > 75%)  Cognition Comprehension Comprehension assist level: Follows complex conversation/direction with no assist  Expression Expression assist level: Expresses complex ideas: With no assist  Social Interaction Social Interaction assist level: Interacts appropriately with others - No medications needed.  Problem Solving Problem solving assist level: Solves complex problems: Recognizes & self-corrects  Memory Memory assist level: Complete Independence: No helper    Medical Problem List  and Plan: 1. Left hemiparesthesias with balance deficits mild cognitive impairment secondary to right frontoparietal infarct after carotid angiogram. Follow-up 4 weeks outpatient vascular surgery to consider carotid enterectomy  Continue CIR 2. DVT Prophylaxis/Anticoagulation: Subcutaneous Lovenox. Monitor platelet counts and any signs of bleeding 3. Pain Management: Oxycodone as needed -kpad for left neck, ROM with therapy 4. Diabetes mellitus with peripheral neuropathy.   Glucophage 1000 mg twice a day, decreased to 500 BID due to GFR.   Check blood sugars before meals and at bedtime. Diabetic teaching  Fairly well controlled at present 5. Neuropsych: This patient is capable of making decisions on his own behalf. 6. Skin/Wound Care: Routine skin checks 7. Fluids/Electrolytes/Nutrition: Routine I&O's  Mild hyponatremia: resolved. 137 on 5/12 8. Hypertension. Norvasc 10 mg daily, hydrochlorothiazide 25 mg daily, lisinopril 40 mg daily, Toprol-XL 100 mg daily. Monitor with increased activity 9. Diastolic congestive heart failure. Monitor for any signs of fluid overload. Weigh patient daily 10.CRI. Baseline creatinine 1.40-1.48.   Creatinine 1.95 on 5/12  Labs ordered for Monday  Will encourage by mouth fluid intake 11. Hyperlipidemia. Pravachol 12. Leukocytosis: Resolved  WBCs 8.5 on 5/8  Afebrile, no signs and symptoms of infection.  Will continue to monitor 13. Constipation  Abdominal x-ray reviewed from 5/9-normal   Resolved 14. Insomnia  Patient does not wish to take  any medications  Improving 15. Folliculitis  Right gluteal area, stable  Will start keflex on 5/12-5/19   LOS (Days) 7 A FACE TO FACE EVALUATION WAS PERFORMED  Ankit Lorie Phenix 11/29/2015 10:19 AM

## 2015-11-29 NOTE — Progress Notes (Signed)
Occupational Therapy Session Note  Patient Details  Name: Evan Stanley MRN: [id number] Date of Birth: 25-Apr-1941  Today's Date: 11/29/2015 OT Individual Time: 0830-0900 OT Individual Time Calculation (min): 30 min    Short Term Goals: Week 1:  OT Short Term Goal 1 (Week 1): Pt will complete toilet transfers with supervision with LRAD OT Short Term Goal 2 (Week 1): Pt will complete walk-in shower transfers with min assist with LRAD OT Short Term Goal 3 (Week 1): Pt will complete LB dressing with supervision OT Short Term Goal 4 (Week 1): Pt will complete toileting tasks with supervision  Skilled Therapeutic Interventions/Progress Updates: Therapeutic activity with focus on improved safety awareness, intellectual/emergent awareness of balance deficits, and dynamic standing balance.   Using [employer], Wii [employer], and Wii Balance board,  pt completed balance awareness (COG) and basic balance test during re-ed on fall risk related to balance deficits.   Pt acknowledged instruction and declared awareness of his deficits with need for supervision for safety.   Pt reports that discharge plans now include his wife using earned leave to stay with him during recovery at home.   OT advised pt that deficits may linger for prolonged period however pt denies statement and assured OT that he would implement improved safety during BADL.   Plan to re-assess during next session of BADL task.      Therapy Documentation Precautions:  Precautions Precautions: Fall Restrictions Weight Bearing Restrictions: No  Vital Signs: Therapy Vitals Pulse Rate: 72 BP: 124/60 mmHg  Pain: Pain Assessment Pain Assessment: No/denies pain  See Function Navigator for Current Functional Status.   Therapy/Group: Individual Therapy   Second session: Time: 1000-1100 Time Calculation (min): 60  min  Pain Assessment: No/denies pain  Skilled Therapeutic Interventions: ADL-retraining at shower level with focus on  safety awareness and emergent awareness of falls precautions.    Pt received seated in w/c and receptive for bathing/dressing.  Pt dons TEDs and AFO and requests RW for mobility.   Pt ambulates to bathroom but continue to demo scissoring gait although now affirming fall risk.  Despite cues to use his awareness to self-monitor during BADL, pt continues to attempt standing on one leg to remove lower body clothing until cued to sit down.   Pt's responds with humor however continues to require cues throughout session to use grab bars while standing.   Pt endorsed need for shower chair and reiterated plan to have grab bars installed in his walk-in shower.   Pt dresses seated on BSC in bathroom but requires setup to place RW for steadying assist.   Overall, pt performs self-care unassisted but requires supervision and cues for safety and to problem-solve.   See FIM for current functional status  Therapy/Group: Individual Therapy  [place] 11/29/2015, 10:13 AM

## 2015-11-30 ENCOUNTER — Inpatient Hospital Stay (HOSPITAL_COMMUNITY): Payer: BLUE CROSS/BLUE SHIELD | Admitting: *Deleted

## 2015-11-30 DIAGNOSIS — I6359 Cerebral infarction due to unspecified occlusion or stenosis of other cerebral artery: Secondary | ICD-10-CM

## 2015-11-30 LAB — GLUCOSE, CAPILLARY
GLUCOSE-CAPILLARY: 107 mg/dL — AB (ref 65–99)
GLUCOSE-CAPILLARY: 129 mg/dL — AB (ref 65–99)
GLUCOSE-CAPILLARY: 138 mg/dL — AB (ref 65–99)
GLUCOSE-CAPILLARY: 152 mg/dL — AB (ref 65–99)

## 2015-11-30 NOTE — Progress Notes (Signed)
[facility] PHYSICAL MEDICINE & REHABILITATION     PROGRESS NOTE  Subjective/Complaints:  Lying in bed. Really no new complaints today. Slept well aints today.    ROS: Denies CP, SOB, nausea, vomiting, diarrhea.  Objective: Vital Signs: Blood pressure 120/70, pulse 68, temperature 98 F (36.7 C), temperature source Oral, resp. rate 18, height 5' 5"  (1.651 m), weight 74.662 kg (164 lb 9.6 oz), SpO2 99 %. Dg Abd Portable 1v  11/28/2015  CLINICAL DATA:  Recent episodes of vomiting EXAM: PORTABLE ABDOMEN - 1 VIEW COMPARISON:  Nov 26, 2015 FINDINGS: There is fairly diffuse stool throughout the colon without colonic dilatation from stool. No bowel dilatation or air-fluid level suggesting obstruction. No free air. Lung bases are clear. There is degenerative change in the lumbar spine. There are foci of atherosclerotic calcification in the pelvis. IMPRESSION: Fairly diffuse stool throughout the colon. No bowel obstruction or free air. Electronically Signed   By: Lowella Grip III M.D.   On: 11/28/2015 14:01   No results for input(s): WBC, HGB, HCT, PLT in the last 72 hours.  Recent Labs  11/29/15 0058 11/29/15 0602  NA  --  137  K  --  4.6  CL  --  101  GLUCOSE  --  127*  BUN  --  35*  CREATININE 1.82* 1.95*  CALCIUM  --  9.3   CBG (last 3)   Recent Labs  11/29/15 1627 11/29/15 2128 11/30/15 0706  GLUCAP 136* 102* 107*    Wt Readings from Last 3 Encounters:  11/30/15 74.662 kg (164 lb 9.6 oz)  11/22/15 78 kg (171 lb 15.3 oz)  11/08/15 80.196 kg (176 lb 12.8 oz)    Physical Exam:  BP 120/70 mmHg  Pulse 68  Temp(Src) 98 F (36.7 C) (Oral)  Resp 18  Ht 5' 5"  (1.651 m)  Wt 74.662 kg (164 lb 9.6 oz)  BMI 27.39 kg/m2  SpO2 99% Constitutional: He appears well-developed. NAD. Vital signs reviewed. HENT: Normocephalic. Atraumatic Eyes: EOM and conjunctiva are normal.  Cardiovascular: Cardiac rate control. + Murmur  Respiratory: Effort normal and breath sounds normal.  No respiratory distress.  GI: Soft. Bowel sounds are normal. He exhibits no distension.  Musculoskeletal: No edema.  Neurological: He is alert and oriented Motor: 5/5 RUE/RLE LUE/LLE: 4+/5 proximal to distal. Mild ataxia left upper extremity.  Skin: Skin is warm and dry. Folliculitis right gluteal area Psych: mood is pleasant and cooperative. Normal mood and affect.  Assessment/Plan: 1. Functional deficits secondary to right frontal temporal infarct which require 3+ hours per day of interdisciplinary therapy in a comprehensive inpatient rehab setting. Physiatrist is providing close team supervision and 24 hour management of active medical problems listed below. Physiatrist and rehab team continue to assess barriers to discharge/monitor patient progress toward functional and medical goals.  Function:  Bathing Bathing position   Position: Shower  Bathing parts Body parts bathed by patient: Right arm, Left arm, Chest, Abdomen, Front perineal area, Buttocks, Right upper leg, Left upper leg, Right lower leg, Left lower leg Body parts bathed by helper: Back  Bathing assist Assist Level: Supervision or verbal cues      Upper Body Dressing/Undressing Upper body dressing   What is the patient wearing?: Pull over shirt/dress     Pull over shirt/dress - Perfomed by patient: Thread/unthread right sleeve, Thread/unthread left sleeve, Put head through opening, Pull shirt over trunk          Upper body assist Assist Level: More than reasonable  time   Set up : To obtain clothing/put away  Lower Body Dressing/Undressing Lower body dressing   What is the patient wearing?: Pants, Underwear, [employer], Shoes Underwear - Performed by patient: Thread/unthread right underwear leg, Thread/unthread left underwear leg, Pull underwear up/down Underwear - Performed by helper: Pull underwear up/down Pants- Performed by patient: Thread/unthread left pants leg, Thread/unthread right pants leg, Pull  pants up/down, Fasten/unfasten pants Pants- Performed by helper: Pull pants up/down     Socks - Performed by patient: Don/doff right sock, Don/doff left sock   Shoes - Performed by patient: Don/doff right shoe, Fasten right, Fasten left Shoes - Performed by helper: Don/doff left shoe (with AFO)     TED Hose - Performed by patient: Don/doff left TED hose, Don/doff right TED hose TED Hose - Performed by helper: Don/doff right TED hose  Lower body assist Assist for lower body dressing: Supervision or verbal cues   Set up : To obtain clothing/put away  Toileting Toileting   Toileting steps completed by patient: Adjust clothing prior to toileting, Performs perineal hygiene, Adjust clothing after toileting Toileting steps completed by helper: Adjust clothing prior to toileting, Performs perineal hygiene, Adjust clothing after toileting (per Malachi Carl, NT report) Toileting Assistive Devices: Grab bar or rail  Toileting assist Assist level: Supervision or verbal cues   Transfers Chair/bed transfer   Chair/bed transfer method: Stand pivot Chair/bed transfer assist level: Touching or steadying assistance (Pt > 75%) Chair/bed transfer assistive device: Medical sales representative     Max distance: 82f Assist level: Touching or steadying assistance (Pt > 75%)   Wheelchair   Type: Manual Max wheelchair distance: 150 Assist Level: Touching or steadying assistance (Pt > 75%)  Cognition Comprehension Comprehension assist level: Follows complex conversation/direction with no assist  Expression Expression assist level: Expresses complex ideas: With no assist  Social Interaction Social Interaction assist level: Interacts appropriately with others - No medications needed.  Problem Solving Problem solving assist level: Solves complex problems: Recognizes & self-corrects  Memory Memory assist level: Complete Independence: No helper    Medical Problem List and Plan: 1. Left  hemiparesthesias with balance deficits mild cognitive impairment secondary to right frontoparietal infarct after carotid angiogram. Follow-up 4 weeks outpatient vascular surgery to consider carotid enterectomy  Continue CIR 2. DVT Prophylaxis/Anticoagulation: Subcutaneous Lovenox. Monitor platelet counts and any signs of bleeding 3. Pain Management: Oxycodone as needed -kpad for left neck, ROM with therapy 4. Diabetes mellitus with peripheral neuropathy.   Glucophage 1000 mg twice a day, decreased to 500 BID due to GFR.   Check blood sugars before meals and at bedtime. Diabetic teaching  Remains fairly well controlled at present 5. Neuropsych: This patient is capable of making decisions on his own behalf. 6. Skin/Wound Care: Routine skin checks 7. Fluids/Electrolytes/Nutrition: Routine I&O's  Mild hyponatremia: resolved. 137 on 5/12 8. Hypertension. Norvasc 10 mg daily, hydrochlorothiazide 25 mg daily, lisinopril 40 mg daily, Toprol-XL 100 mg daily.     -controlled at present 9. Diastolic congestive heart failure. Monitor for any signs of fluid overload. Weigh patient daily 10.CRI. Baseline creatinine 1.40-1.48.   Creatinine 1.95 on 5/12  Labs ordered for Monday  Will encourage by mouth fluid intake 11. Hyperlipidemia. Pravachol 12. Leukocytosis: Resolved  WBCs 8.5 on 5/8  Afebrile, no signs and symptoms of infection.  Will continue to monitor 13. Constipation  Abdominal x-ray reviewed from 5/9-normal   Resolved 14. Insomnia  Patient does not wish to take any medications  Improving overall 15. Folliculitis  Right gluteal area, stable  Keflex initiated on 5/12-continue through 5/19   LOS (Days) 8 A FACE TO FACE EVALUATION WAS PERFORMED  Ruqayya Ventress T 11/30/2015 9:51 AM

## 2015-11-30 NOTE — Progress Notes (Signed)
Physical Therapy Session Note  Patient Details  Name: Evan Stanley MRN: [id number] Date of Birth: 1941-06-16  Today's Date: 11/30/2015 PT Individual Time: 1430-1500 PT Individual Time Calculation (min): 30 min    Skilled Therapeutic Interventions/Progress Updates:  Patient sitting in recliner at the beginning of session , agrees to interventions with no c/o pain or discomfort,assited in donning TEDs and shoes with L AFO in place. Training in gait to therapy gym with Supervision and increased need for cues for step length, path following and general focus on activity, easily distracted with impulsive behaviors.  Higher level balance training with target stepping , four square stepping. Verbal cues for placement and weight shifting.  Patient returned to room, with Supervision for gait.  Left in recliner with wife present.  Both patient and wife have difficult time understanding why therapist assists with activities, explanation provided about safety and impulsive behaviors which might lead to a LOB and fall- questionable carryover.   Therapy Documentation Precautions:  Precautions Precautions: Fall Restrictions Weight Bearing Restrictions: No Vital Signs: Therapy Vitals Temp: 97.8 F (36.6 C) Temp Source: Oral Pulse Rate: (!) 57 Resp: 18 BP: (!) 119/48 mmHg Patient Position (if appropriate): Sitting Oxygen Therapy SpO2: 100 % O2 Device: Not Delivered   See Function Navigator for Current Functional Status.   Therapy/Group: Individual Therapy  [place] Spanish 11/30/2015, 3:50 PM

## 2015-12-01 ENCOUNTER — Inpatient Hospital Stay (HOSPITAL_COMMUNITY): Payer: BLUE CROSS/BLUE SHIELD | Admitting: Physical Therapy

## 2015-12-01 DIAGNOSIS — N189 Chronic kidney disease, unspecified: Secondary | ICD-10-CM

## 2015-12-01 LAB — GLUCOSE, CAPILLARY
GLUCOSE-CAPILLARY: 147 mg/dL — AB (ref 65–99)
GLUCOSE-CAPILLARY: 122 mg/dL — AB (ref 65–99)
Glucose-Capillary: 122 mg/dL — ABNORMAL HIGH (ref 65–99)
Glucose-Capillary: 148 mg/dL — ABNORMAL HIGH (ref 65–99)

## 2015-12-01 NOTE — Progress Notes (Signed)
[facility] PHYSICAL MEDICINE & REHABILITATION     PROGRESS NOTE  Subjective/Complaints:  Up at sink cleaning up. No complaints    ROS: Denies CP, SOB, nausea, vomiting, diarrhea.  Objective: Vital Signs: Blood pressure 134/70, pulse 74, temperature 98.6 F (37 C), temperature source Oral, resp. rate 18, height 5' 5"  (1.651 m), weight 74.254 kg (163 lb 11.2 oz), SpO2 97 %. No results found. No results for input(s): WBC, HGB, HCT, PLT in the last 72 hours.  Recent Labs  11/29/15 0058 11/29/15 0602  NA  --  137  K  --  4.6  CL  --  101  GLUCOSE  --  127*  BUN  --  35*  CREATININE 1.82* 1.95*  CALCIUM  --  9.3   CBG (last 3)   Recent Labs  11/30/15 1628 11/30/15 2125 12/01/15 0654  GLUCAP 138* 152* 122*    Wt Readings from Last 3 Encounters:  12/01/15 74.254 kg (163 lb 11.2 oz)  11/22/15 78 kg (171 lb 15.3 oz)  11/08/15 80.196 kg (176 lb 12.8 oz)    Physical Exam:  BP 134/70 mmHg  Pulse 74  Temp(Src) 98.6 F (37 C) (Oral)  Resp 18  Ht 5' 5"  (1.651 m)  Wt 74.254 kg (163 lb 11.2 oz)  BMI 27.24 kg/m2  SpO2 97% Constitutional: He appears well-developed. NAD. Vital signs reviewed. HENT: Normocephalic. Atraumatic Eyes: EOM and conjunctiva are normal.  Cardiovascular: Cardiac rate control. + Murmur  Respiratory: Effort normal and breath sounds normal. No respiratory distress.  GI: Soft. Bowel sounds are normal. He exhibits no distension.  Musculoskeletal: No edema.  Neurological: He is alert and oriented Motor: 5/5 RUE/RLE LUE/LLE: 4+/5 proximal to distal. Mild ataxia left upper extremity.  Skin: Skin is warm and dry. Folliculitis right gluteal area Psych: mood is pleasant and cooperative. Normal mood and affect.  Assessment/Plan: 1. Functional deficits secondary to right frontal temporal infarct which require 3+ hours per day of interdisciplinary therapy in a comprehensive inpatient rehab setting. Physiatrist is providing close team supervision and 24  hour management of active medical problems listed below. Physiatrist and rehab team continue to assess barriers to discharge/monitor patient progress toward functional and medical goals.  Function:  Bathing Bathing position   Position: Shower  Bathing parts Body parts bathed by patient: Right arm, Left arm, Chest, Abdomen, Front perineal area, Buttocks, Right upper leg, Left upper leg, Right lower leg, Left lower leg Body parts bathed by helper: Back  Bathing assist Assist Level: Supervision or verbal cues      Upper Body Dressing/Undressing Upper body dressing   What is the patient wearing?: Pull over shirt/dress     Pull over shirt/dress - Perfomed by patient: Thread/unthread right sleeve, Thread/unthread left sleeve, Put head through opening, Pull shirt over trunk          Upper body assist Assist Level: More than reasonable time   Set up : To obtain clothing/put away  Lower Body Dressing/Undressing Lower body dressing   What is the patient wearing?: Pants, Underwear, [employer], Shoes Underwear - Performed by patient: Thread/unthread right underwear leg, Thread/unthread left underwear leg, Pull underwear up/down Underwear - Performed by helper: Pull underwear up/down Pants- Performed by patient: Thread/unthread left pants leg, Thread/unthread right pants leg, Pull pants up/down, Fasten/unfasten pants Pants- Performed by helper: Pull pants up/down     Socks - Performed by patient: Don/doff right sock, Don/doff left sock   Shoes - Performed by patient: Don/doff right shoe, [employer]  right, Fasten left Shoes - Performed by helper: Don/doff left shoe (with AFO)     TED Hose - Performed by patient: Don/doff left TED hose, Don/doff right TED hose TED Hose - Performed by helper: Don/doff right TED hose  Lower body assist Assist for lower body dressing: Supervision or verbal cues   Set up : To obtain clothing/put away  Toileting Toileting   Toileting steps completed by patient:  Adjust clothing prior to toileting, Adjust clothing after toileting, Performs perineal hygiene Toileting steps completed by helper: Adjust clothing prior to toileting, Performs perineal hygiene, Adjust clothing after toileting (per Malachi Carl, NT report) Toileting Assistive Devices: Grab bar or rail  Toileting assist Assist level: Supervision or verbal cues   Transfers Chair/bed transfer   Chair/bed transfer method: Stand pivot Chair/bed transfer assist level: Supervision or verbal cues Chair/bed transfer assistive device: Medical sales representative     Max distance: 34f Assist level: Touching or steadying assistance (Pt > 75%)   Wheelchair   Type: Manual Max wheelchair distance: 150 Assist Level: Touching or steadying assistance (Pt > 75%)  Cognition Comprehension Comprehension assist level: Follows complex conversation/direction with no assist  Expression Expression assist level: Expresses complex ideas: With no assist  Social Interaction Social Interaction assist level: Interacts appropriately with others - No medications needed.  Problem Solving Problem solving assist level: Solves complex problems: Recognizes & self-corrects  Memory Memory assist level: Complete Independence: No helper    Medical Problem List and Plan: 1. Left hemiparesthesias with balance deficits mild cognitive impairment secondary to right frontoparietal infarct after carotid angiogram. Follow-up 4 weeks outpatient vascular surgery to consider carotid enterectomy  Continue CIR 2. DVT Prophylaxis/Anticoagulation: Subcutaneous Lovenox. Monitor platelet counts and any signs of bleeding 3. Pain Management: Oxycodone as needed -kpad for left neck, ROM with therapy 4. Diabetes mellitus with peripheral neuropathy.   Glucophage 1000 mg twice a day, decreased to 500 BID due to GFR.   Check blood sugars before meals and at bedtime. Diabetic teaching  Remains fairly well controlled at  present 5. Neuropsych: This patient is capable of making decisions on his own behalf. 6. Skin/Wound Care: Routine skin checks 7. Fluids/Electrolytes/Nutrition: Routine I&O's  Mild hyponatremia: resolved. 137 on 5/12 8. Hypertension. Norvasc 10 mg daily, hydrochlorothiazide 25 mg daily, lisinopril 40 mg daily, Toprol-XL 100 mg daily.     -controlled at present 9. Diastolic congestive heart failure. Monitor for any signs of fluid overload. Weigh patient daily 10.CRI. Baseline creatinine 1.40-1.48.   Creatinine 1.95 on 5/12  Labs ordered for Monday  Will encourage by mouth fluid intake 11. Hyperlipidemia. Pravachol 12. Leukocytosis: Resolved  WBCs 8.5 on 5/8  Afebrile, no signs and symptoms of infection.  Will continue to monitor 13. Constipation  Abdominal x-ray reviewed from 5/9-normal   Resolved 14. Insomnia  Patient does not wish to take any medications  Improving overall 15. Folliculitis  Right gluteal area, stable  Keflex initiated on 5/12-continue through 5/19   LOS (Days) 9 A FACE TO FACE EVALUATION WAS PERFORMED  Cesilia Shinn T 12/01/2015 9:10 AM

## 2015-12-01 NOTE — Progress Notes (Signed)
Physical Therapy Weekly Progress Note  Patient Details  Name: Evan Stanley MRN: [id number] Date of Birth: 1940-11-14  Beginning of progress report period: Nov 23, 2015 End of progress report period: Dec 01, 2015  Today's Date: 12/01/2015 PT Individual Time: 1000-1103 PT Individual Time Calculation (min): 63 min   Patient has met 4 of 4 short term goals.  Pt has made improvements with gait & is now at [facility] A/ supervision level with L AFO & RW. However, pt demonstrates decreased safety awareness & impaired cognition.  Patient continues to demonstrate the following deficits: decreased L hip strength, decreased LLE motor planing & execution when walking causing L foot drag, unsteadiness with gait, impaired standing balance, decreased safety awareness and therefore will continue to benefit from skilled PT intervention to enhance overall performance with activity tolerance, balance, postural control, ability to compensate for deficits, attention and awareness. Pt will benefit from family training prior to d/c.   Patient progressing toward long term goals..  Continue plan of care.  PT Short Term Goals Week 1:  PT Short Term Goal 1 (Week 1): Patient will perform Stand/sqaut pivot with min A.  PT Short Term Goal 1 - Progress (Week 1): Met PT Short Term Goal 2 (Week 1): Patient will ambulate 130f with min A with LRAD PT Short Term Goal 2 - Progress (Week 1): Met PT Short Term Goal 3 (Week 1): Patient will performed bed mobility with supervision A.  PT Short Term Goal 3 - Progress (Week 1): Met PT Short Term Goal 4 (Week 1): Patient will ascend/descent 4 steps with min A from PT.  PT Short Term Goal 4 - Progress (Week 1): Met Week 2:  PT Short Term Goal 1 (Week 2): STG = LTG due to d/c date.   Skilled Therapeutic Interventions/Progress Updates:    Pt received in recliner & agreeable to PT, denying c/o pain. PT assisted pt with donning ted hose, shoes & L AFO total A for time management. Gait  training room>ortho gym ~200 ft with RW & supervision A with improved L foot clearance but minimal L trendelenburg gait still noted & pt able to note that L hip is "shifting out". Pt able to complete car transfer with RW & supervision from SUV simulated seat height with PT providing instruction to hold on to RW instead of car door. On mat table pt performed quadruped reaching with single UE, with more difficulty raising L UE compared to RUE. Pt fatigued quickly with exercise & took prone rest break in between sets for hip flexor stretch. Pt able to transfer from sitting EOB<>quadruped without assistance. Utilized nu-step on Level 5 x 5 minutes for cardiovascular endurance training but pt c/o significant buttocks pain 2/2 "boils" & activity was discontinued. Balance & cognition addressed by having pt stand on compliant surface without UE support while playing matching game with LUE. Pt agitated when therapist asked pt to play game with LUE for neuro re-training purposes & still aggravated after therapist explanation as pt reports he is R handed & never uses his L hand. Pt able to tolerate standing x ~7 minutes before requiring a seated a rest break. Pt with difficulty recalling cards in game but was improving towards end of task when he has less options to chose from. Gait training x 100 ft back to room with RW & supervision, however during all gait training during session pt demonstrated 3 losses of balance as pt was not clearing LLE & required Min A to recover  as well as cuing to decrease gait speed. Therapist also provided frequent cuing to look up at environment instead of at feet with fair carryover from pt. At end of session pt left in w/c with instructions to call for nursing assistance when wanting to get up; pt left with all needs within reach.   During session pt reported he was "measured for a brace" last week; will follow up on pt receiving AFO prior to d/c as pt will benefit from this for increased L  foot clearance & safety with gait.   Therapy Documentation Precautions:  Precautions Precautions: Fall Restrictions Weight Bearing Restrictions: No  Pain: Pain Assessment Pain Assessment: No/denies pain   See Function Navigator for Current Functional Status.  Therapy/Group: Individual Therapy  Waunita Schooner 12/01/2015, 8:33 AM

## 2015-12-01 NOTE — Plan of Care (Signed)
Problem: RH Stairs Goal: LTG Patient will ambulate up and down stairs w/assist (PT) LTG: Patient will ambulate up and down # of stairs with assistance (PT) 1 step without rails with LRAD

## 2015-12-02 ENCOUNTER — Inpatient Hospital Stay (HOSPITAL_COMMUNITY): Payer: Medicare Other | Admitting: Physical Therapy

## 2015-12-02 ENCOUNTER — Inpatient Hospital Stay (HOSPITAL_COMMUNITY): Payer: BLUE CROSS/BLUE SHIELD | Admitting: Speech Pathology

## 2015-12-02 ENCOUNTER — Inpatient Hospital Stay (HOSPITAL_COMMUNITY): Payer: BLUE CROSS/BLUE SHIELD | Admitting: Occupational Therapy

## 2015-12-02 ENCOUNTER — Inpatient Hospital Stay (HOSPITAL_COMMUNITY): Payer: BLUE CROSS/BLUE SHIELD

## 2015-12-02 ENCOUNTER — Ambulatory Visit: Payer: BLUE CROSS/BLUE SHIELD | Admitting: Physician Assistant

## 2015-12-02 ENCOUNTER — Inpatient Hospital Stay (HOSPITAL_COMMUNITY): Payer: Medicare Other

## 2015-12-02 DIAGNOSIS — I639 Cerebral infarction, unspecified: Secondary | ICD-10-CM | POA: Insufficient documentation

## 2015-12-02 LAB — CBC WITH DIFFERENTIAL/PLATELET
BASOS ABS: 0.1 10*3/uL (ref 0.0–0.1)
BASOS PCT: 1 %
EOS ABS: 0.2 10*3/uL (ref 0.0–0.7)
Eosinophils Relative: 2 %
HCT: 41 % (ref 39.0–52.0)
HEMOGLOBIN: 13.8 g/dL (ref 13.0–17.0)
Lymphocytes Relative: 17 %
Lymphs Abs: 1.5 10*3/uL (ref 0.7–4.0)
MCH: 27.9 pg (ref 26.0–34.0)
MCHC: 33.7 g/dL (ref 30.0–36.0)
MCV: 83 fL (ref 78.0–100.0)
Monocytes Absolute: 0.9 10*3/uL (ref 0.1–1.0)
Monocytes Relative: 10 %
NEUTROS PCT: 70 %
Neutro Abs: 6.1 10*3/uL (ref 1.7–7.7)
Platelets: 300 10*3/uL (ref 150–400)
RBC: 4.94 MIL/uL (ref 4.22–5.81)
RDW: 13 % (ref 11.5–15.5)
WBC: 8.7 10*3/uL (ref 4.0–10.5)

## 2015-12-02 LAB — BASIC METABOLIC PANEL
Anion gap: 13 (ref 5–15)
BUN: 28 mg/dL — ABNORMAL HIGH (ref 6–20)
CALCIUM: 10.1 mg/dL (ref 8.9–10.3)
CHLORIDE: 101 mmol/L (ref 101–111)
CO2: 25 mmol/L (ref 22–32)
CREATININE: 2 mg/dL — AB (ref 0.61–1.24)
GFR calc non Af Amer: 32 mL/min — ABNORMAL LOW (ref >60)
GFR, EST AFRICAN AMERICAN: 37 mL/min — AB (ref >60)
Glucose, Bld: 137 mg/dL — ABNORMAL HIGH (ref 65–99)
Potassium: 4 mmol/L (ref 3.5–5.1)
SODIUM: 139 mmol/L (ref 135–145)

## 2015-12-02 LAB — GLUCOSE, CAPILLARY
GLUCOSE-CAPILLARY: 141 mg/dL — AB (ref 65–99)
GLUCOSE-CAPILLARY: 109 mg/dL — AB (ref 65–99)
Glucose-Capillary: 150 mg/dL — ABNORMAL HIGH (ref 65–99)
Glucose-Capillary: 121 mg/dL — ABNORMAL HIGH (ref 65–99)

## 2015-12-02 MED ORDER — COLLAGENASE 250 UNIT/GM EX OINT
TOPICAL_OINTMENT | Freq: Every day | CUTANEOUS | Status: DC
Start: 2015-12-02 — End: 2015-12-04
  Administered 2015-12-02 – 2015-12-04 (×3): via TOPICAL
  Filled 2015-12-02: qty 30

## 2015-12-02 NOTE — Progress Notes (Signed)
Physical Therapy Session Note  Patient Details  Name: Evan Stanley MRN: [id number] Date of Birth: December 27, 1940  Today's Date: 12/02/2015 PT Individual Time: 0900-0945 PT Individual Time Calculation (min): 45 min   Short Term Goals: Week 1:  PT Short Term Goal 1 (Week 1): Patient will perform Stand/sqaut pivot with min A.  PT Short Term Goal 1 - Progress (Week 1): Met PT Short Term Goal 2 (Week 1): Patient will ambulate 154f with min A with LRAD PT Short Term Goal 2 - Progress (Week 1): Met PT Short Term Goal 3 (Week 1): Patient will performed bed mobility with supervision A.  PT Short Term Goal 3 - Progress (Week 1): Met PT Short Term Goal 4 (Week 1): Patient will ascend/descent 4 steps with min A from PT.  PT Short Term Goal 4 - Progress (Week 1): Met Week 2:  PT Short Term Goal 1 (Week 2): STG = LTG due to d/c date.   Skilled Therapeutic Interventions/Progress Updates:    [facility]. Patient able to don TED hose, Bliat shoes and L AFO with set up only from PT. [facility] with total A for energy conservation  Gait training on uneven cement sidewalk using RW with supervision A from PT for [id number]; min cues for improved LLE step height and improved L hip stabilization/ rotation.   standing balacne with putting 5 golf balls and bending over to place ball on ground.   Step training x with RW and close supervision A. Mod cues for improves step to gait pattern and AD management  Gait trianing with LBQC x 306fith min A and SBQC [id number] Min A. Mod-max cues for step-to gait pattern and improved weight shift over the LLE.   [facility] returned to room and [facility] reach.   Therapy Documentation Precautions:  Precautions Precautions: Fall Restrictions Weight Bearing Restrictions: No General:   Vital Signs: Therapy Vitals Temp: 98.6 F (37 C) Temp Source: Oral Pulse Rate:  86 Resp: 18 BP: 129/73 mmHg Patient Position (if appropriate): Lying Oxygen Therapy SpO2: 99 % O2 Device: Not Delivered   See Function Navigator for Current Functional Status.   Therapy/Group: Individual Therapy  AustLorie Phenix5/2017, 10:00 AM

## 2015-12-02 NOTE — Progress Notes (Signed)
Social Work Patient ID: Evan Stanley, male   DOB: [birth year], 75 y.o.   MRN: [id number] Dan-PA completed FMLA papers for wife and I have faxed them in and have given originals to pt. Wife aware where they are and will pick up Later today when visits. Aware pt will require 24 hr supervision due to safety issues, wife feels he is doing almost as good as before his stroke.

## 2015-12-02 NOTE — Progress Notes (Signed)
Speech Language Pathology Daily Session Note  Patient Details  Name: Evan Stanley MRN: [id number] Date of Birth: 05/29/1941  Today's Date: 12/02/2015 SLP Individual Time: 1100-1130 SLP Individual Time Calculation (min): 30 min  Short Term Goals: Week 1: SLP Short Term Goal 1 (Week 1): Patient will complete complex problem solving for mildly complex tasks with Mod A verbal cues and 90% accuracy.  SLP Short Term Goal 2 (Week 1): Patient will demonstrate anticipatory awareness and identify 3 tasks that he can partiicpate in at home safely with Mod A verbal cues.  SLP Short Term Goal 3 (Week 1): Patient will utilize external memory aids to recall new, daily information with Mod A verbal cues.  SLP Short Term Goal 4 (Week 1): Patient will complete abstract reasoning with Mod A verbal cues and 90% accuracy.   Skilled Therapeutic Interventions: Skilled treatment session focused on addressing cognition goals. SLP facilitated session by providing a structured task with Mod faded to [place] assist verbal, question cues to utilize association strategies to assist with recall of new information.  Patient verbalized good anticipatory/safety awareness with discharge planning by naming 3 tasks that he could safety participate in at home.  Continue with current plan of care.    Function:  Cognition Comprehension Comprehension assist level: Follows complex conversation/direction with no assist  Expression   Expression assist level: Expresses complex ideas: With no assist  Social Interaction Social Interaction assist level: Interacts appropriately with others - No medications needed.  Problem Solving Problem solving assist level: Solves complex problems: Recognizes & self-corrects  Memory Memory assist level: Complete Independence: No helper    Pain Pain Assessment Pain Assessment: No/denies pain  Therapy/Group: Individual Therapy  Carmelia Roller., [place] [phone]  [place] 12/02/2015, 11:03  AM

## 2015-12-02 NOTE — Progress Notes (Signed)
Evan Stanley PHYSICAL MEDICINE & REHABILITATION     PROGRESS NOTE  Subjective/Complaints:  Patient sitting his recliner this morning. Evan Stanley is in good spirits and states Evan Stanley had a good weekend.  ROS: Denies CP, SOB, nausea, vomiting, diarrhea.  Objective: Vital Signs: Blood pressure 129/73, pulse 86, temperature 98.6 F (37 C), temperature source Oral, resp. rate 18, height 5' 5"  (1.651 m), weight 74.208 kg (163 lb 9.6 oz), SpO2 99 %. No results found. No results for input(s): WBC, HGB, HCT, PLT in the last 72 hours. No results for input(s): NA, K, CL, GLUCOSE, BUN, CREATININE, CALCIUM in the last 72 hours.  Invalid input(s): CO CBG (last 3)   Recent Labs  12/01/15 1643 12/01/15 2055 12/02/15 0611  GLUCAP 147* 122* 141*    Wt Readings from Last 3 Encounters:  12/02/15 74.208 kg (163 lb 9.6 oz)  11/22/15 78 kg (171 lb 15.3 oz)  11/08/15 80.196 kg (176 lb 12.8 oz)    Physical Exam:  BP 129/73 mmHg  Pulse 86  Temp(Src) 98.6 F (37 C) (Oral)  Resp 18  Ht 5' 5"  (1.651 m)  Wt 74.208 kg (163 lb 9.6 oz)  BMI 27.22 kg/m2  SpO2 99% Constitutional: Evan Stanley appears well-developed. NAD. Vital signs reviewed. HENT: Normocephalic. Atraumatic Eyes: EOM and conjunctiva are normal.  Cardiovascular: Cardiac rate control. + Murmur  Respiratory: Effort normal and breath sounds normal. No respiratory distress.  GI: Soft. Bowel sounds are normal. Evan Stanley exhibits no distension.  Musculoskeletal: No edema.  Neurological: Evan Stanley is alert and oriented Motor: 5/5 RUE/RLE LUE/LLE: 4+/5 proximal to distal. Mild ataxia left upper extremity (Improving).  Skin: Skin is warm and dry. Folliculitis right gluteal area 2 (improving) Psych: mood is pleasant and cooperative. Normal mood and affect.  Assessment/Plan: 1. Functional deficits secondary to right frontal temporal infarct which require 3+ hours per day of interdisciplinary therapy in a comprehensive inpatient rehab setting. Physiatrist is providing  close team supervision and 24 hour management of active medical problems listed below. Physiatrist and rehab team continue to assess barriers to discharge/monitor patient progress toward functional and medical goals.  Function:  Bathing Bathing position   Position: Shower  Bathing parts Body parts bathed by patient: Right arm, Left arm, Chest, Abdomen, Front perineal area, Buttocks, Right upper leg, Left upper leg, Right lower leg, Left lower leg Body parts bathed by helper: Back  Bathing assist Assist Level: Supervision or verbal cues      Upper Body Dressing/Undressing Upper body dressing   What is the patient wearing?: Pull over shirt/dress     Pull over shirt/dress - Perfomed by patient: Thread/unthread right sleeve, Thread/unthread left sleeve, Put head through opening, Pull shirt over trunk          Upper body assist Assist Level: More than reasonable time   Set up : To obtain clothing/put away  Lower Body Dressing/Undressing Lower body dressing   What is the patient wearing?: Pants, Underwear, [employer], Shoes Underwear - Performed by patient: Thread/unthread right underwear leg, Thread/unthread left underwear leg, Pull underwear up/down Underwear - Performed by helper: Pull underwear up/down Pants- Performed by patient: Thread/unthread left pants leg, Thread/unthread right pants leg, Pull pants up/down, Fasten/unfasten pants Pants- Performed by helper: Pull pants up/down     Socks - Performed by patient: Don/doff right sock, Don/doff left sock   Shoes - Performed by patient: Don/doff right shoe, Fasten right, Fasten left Shoes - Performed by helper: Don/doff left shoe (with AFO)     TED  Hose - Performed by patient: Don/doff left TED hose, Don/doff right TED hose TED Hose - Performed by helper: Don/doff right TED hose  Lower body assist Assist for lower body dressing: Supervision or verbal cues   Set up : To obtain clothing/put away  Toileting Toileting    Toileting steps completed by patient: Adjust clothing prior to toileting, Performs perineal hygiene, Adjust clothing after toileting Toileting steps completed by helper: Adjust clothing prior to toileting, Performs perineal hygiene, Adjust clothing after toileting (per Evan Stanley, NT report) Toileting Assistive Devices: Grab bar or rail  Toileting assist Assist level: Supervision or verbal cues   Transfers Chair/bed transfer   Chair/bed transfer method: Stand pivot Chair/bed transfer assist level: Supervision or verbal cues Chair/bed transfer assistive device: Medical sales representative     Max distance: 143f Assist level: Supervision or verbal cues   Wheelchair   Type: Manual Max wheelchair distance: 150 Assist Level: Touching or steadying assistance (Pt > 75%)  Cognition Comprehension Comprehension assist level: Follows complex conversation/direction with no assist  Expression Expression assist level: Expresses complex ideas: With no assist  Social Interaction Social Interaction assist level: Interacts appropriately with others - No medications needed.  Problem Solving Problem solving assist level: Solves complex problems: Recognizes & self-corrects  Memory Memory assist level: Complete Independence: No helper    Medical Problem List and Plan: 1. Left hemiparesthesias with balance deficits mild cognitive impairment secondary to right frontoparietal infarct after carotid angiogram. Follow-up 4 weeks outpatient vascular surgery to consider carotid enterectomy  Continue CIR 2. DVT Prophylaxis/Anticoagulation: Subcutaneous Lovenox. Monitor platelet counts and any signs of bleeding 3. Pain Management: Oxycodone as needed -kpad for left neck, ROM with therapy 4. Diabetes mellitus with peripheral neuropathy.   Glucophage 1000 mg twice a day, decreased to 500 BID due to GFR.   Check blood sugars before meals and at bedtime. Diabetic teaching  Remains  fairly well controlled at present 5. Neuropsych: This patient is capable of making decisions on his own behalf. 6. Skin/Wound Care: Routine skin checks 7. Fluids/Electrolytes/Nutrition: Routine I&O's  Mild hyponatremia: resolved. 137 on 5/12  Labs pending for today 8. Hypertension.   Norvasc 10 mg daily  Hydrochlorothiazide 25 mg daily, will DC on 5/16  Lisinopril 40 mg daily  Toprol-XL 100 mg daily.     -controlled at present 9. Diastolic congestive heart failure. Monitor for any signs of fluid overload. Weigh patient daily 10.CRI. Baseline creatinine 1.40-1.48.   Creatinine 1.95 on 5/12  Labs pending for Monday  Continue encourage by mouth fluid intake 11. Hyperlipidemia. Pravachol 12. Leukocytosis: Resolved  WBCs 8.5 on 5/8  Afebrile, no signs and symptoms of infection.  Will continue to monitor 13. Constipation  Abdominal x-ray reviewed from 5/9-normal   Resolved 14. Insomnia  Patient does not wish to take any medications  Improved 15. Folliculitis 2  Right gluteal area, improving  Keflex initiated on 5/12-5/19  Will also add Santyl dressing to distal wound   LOS (Days) 10 A FACE TO FACE EVALUATION WAS PERFORMED  Evan Stanley ALorie Phenix5/15/2017 10:09 AM

## 2015-12-02 NOTE — Progress Notes (Signed)
Occupational Therapy Session Note  Patient Details  Name: Evan Stanley MRN: [id number] Date of Birth: [birth year]  Today's Date: 12/02/2015 OT Individual Time: 1310-1350 OT Individual Time Calculation (min): 40 min    Short Term Goals: Week 1:  OT Short Term Goal 1 (Week 1): Pt will complete toilet transfers with supervision with LRAD OT Short Term Goal 2 (Week 1): Pt will complete walk-in shower transfers with min assist with LRAD OT Short Term Goal 3 (Week 1): Pt will complete LB dressing with supervision OT Short Term Goal 4 (Week 1): Pt will complete toileting tasks with supervision      Skilled Therapeutic Interventions/Progress Updates:    Pt seen for skilled OT to facilitate dynamic balance. Pt receiving nursing care at start of session.  Pt visited briefly with therapy dog and then ambulated to gym with RW. Pt worked on sit><stand 10x 2 without UE support from low surface with min A to guide trunk forward to lower slowly. Pt is independent with sit to stand using UEs.  In standing, left hand coordination and balance with putting golf balls. Dynamic balance with stepping forward and back and laterally to the L and back with L foot. With support from RW, pt able to step forward and back easily. Without walker, pt relied heavily on support through L hand.  Pt ambulated back to room with all needs in place.  Therapy Documentation Precautions:  Precautions Precautions: Fall Restrictions Weight Bearing Restrictions: No General: General OT Amount of Missed Time: 5 Minutes    Pain: Pain Assessment Pain Assessment: No/denies pain ADL:  See Function Navigator for Current Functional Status.   Therapy/Group: Individual Therapy  [place] 12/02/2015, 1:56 PM

## 2015-12-02 NOTE — Progress Notes (Signed)
Occupational Therapy Weekly Progress Note  Patient Details  Name: Evan Stanley MRN: [id number] Date of Birth: [birth year]  Beginning of progress report period: Nov 23, 2015 End of progress report period: Dec 02, 2015  Today's Date: 12/02/2015 OT Individual Time: 8101-7510 OT Individual Time Calculation (min): 60 min    Patient has met 4 of 4 short term goals.  Pt demonstrates ability to perform BADL without physical assistance but continues to requires vc for safety to include unlocking/locking w/c brakes and doffing clothing seated versus standing supported on one leg.   Pt is now verbalizes intellectual awareness of balance deficits and is able to compensate safely 50% of the time without need for cues.  Patient continues to demonstrate the following deficits: Impaired functional mobility, impaired dynamic standing balance, impaired safety and emergent awareness and therefore will continue to benefit from skilled OT intervention to enhance overall performance with BADL and Reduce care partner burden.  Patient progressing toward long term goals..  Continue plan of care.  OT Short Term Goals Week 1:  OT Short Term Goal 1 (Week 1): Pt will complete toilet transfers with supervision with LRAD OT Short Term Goal 1 - Progress (Week 1): Met OT Short Term Goal 2 (Week 1): Pt will complete walk-in shower transfers with min assist with LRAD OT Short Term Goal 2 - Progress (Week 1): Met OT Short Term Goal 3 (Week 1): Pt will complete LB dressing with supervision OT Short Term Goal 3 - Progress (Week 1): Met OT Short Term Goal 4 (Week 1): Pt will complete toileting tasks with supervision OT Short Term Goal 4 - Progress (Week 1): Met Week 2:  OT Short Term Goal 1 (Week 2): STG = LTG d/t short remaining LOS  Skilled Therapeutic Interventions/Progress Updates: ADL-retraining with focus on improved awareness, adapted bathing/dressing skills, dynamic standing balance, discharge planning and functional  mobility using RW.   Pt received seated in w/c and awaiting assist for BADL.   Pt challenged to verbalize awareness of his deficits and need for supervison.   Pt recalled balance deficits as significant with recommendation to remain at w/c level and ambulate only using RW with supervision d/t impaired gait and dynamic standing balance.   Pt ambulated to bathroom with min guard assist and undressed at bath bench with min assist to problem solve sequence for BADL.   OT advised continued use of AFO while ambulating to bathroom and removing only while seated on bench.    Pt complied but elected to stand to remove underwear and pants, shifting to standing on one leg while holding grab bar to doff underwear which resulted in LOB with unassisted recovery back to bench.    Pt then bathed unassisted and returned to his w/c to dress at sink with steadying assist to transfer to w/c and min guard while standing to pull up pants.   Pt now dons TEDs, shoes and AFO unassisted.   Pt left seated at w/c at end of session with all needs within reach.     Therapy Documentation Precautions:  Precautions Precautions: Fall Restrictions Weight Bearing Restrictions: No  Vital Signs: Therapy Vitals Temp: 97.5 F (36.4 C) Temp Source: Oral Pulse Rate: (!) 59 Resp: 18 BP: (!) 98/52 mmHg Patient Position (if appropriate): Sitting Oxygen Therapy SpO2: 100 % O2 Device: Not Delivered   Pain: Pain Assessment Pain Assessment: No/denies pain  See Function Navigator for Current Functional Status.   Therapy/Group: Individual Therapy  [place] 12/02/2015, 2:51 PM

## 2015-12-02 NOTE — Progress Notes (Signed)
Speech Language Pathology Daily Session Note  Patient Details  Name: Evan Stanley MRN: [id number] Date of Birth: [birth year]  Today's Date: 12/02/2015 SLP Individual Time: 1350-1430 SLP Individual Time Calculation (min): 40 min  Short Term Goals: Week 1: SLP Short Term Goal 1 (Week 1): Patient will complete complex problem solving for mildly complex tasks with Mod A verbal cues and 90% accuracy.  SLP Short Term Goal 2 (Week 1): Patient will demonstrate anticipatory awareness and identify 3 tasks that he can partiicpate in at home safely with Mod A verbal cues.  SLP Short Term Goal 3 (Week 1): Patient will utilize external memory aids to recall new, daily information with Mod A verbal cues.  SLP Short Term Goal 4 (Week 1): Patient will complete abstract reasoning with Mod A verbal cues and 90% accuracy.   Skilled Therapeutic Interventions:  Pt was seen for skilled ST targeting cognitive goals.  Pt utilized his daily schedule with mod I to inform therapist of 2 changes that had been made to schedule throughout the day.  SLP facilitated the session with functional conversations regarding discharge planning to address anticipatory awareness of impairments.  Pt was able to identify at least 3 deficits occurring s/p CVA, their impact on his independence in the home environment, and then generate appropriate solutions to potential problems with supervision question cues.   Pt was left in wheelchair with call bell within reach.  Continue per current plan of care.    Function:  Eating Eating                 Cognition Comprehension Comprehension assist level: Follows complex conversation/direction with no assist  Expression   Expression assist level: Expresses complex ideas: With no assist  Social Interaction Social Interaction assist level: Interacts appropriately with others - No medications needed.  Problem Solving Problem solving assist level: Solves basic 90% of the time/requires cueing <  10% of the time  Memory Memory assist level: Recognizes or recalls 90% of the time/requires cueing < 10% of the time    Pain Pain Assessment Pain Assessment: No/denies pain  Therapy/Group: Individual Therapy  Nhi Butrum, Selinda Orion 12/02/2015, 6:28 PM

## 2015-12-02 NOTE — Progress Notes (Signed)
Social Work Patient ID: Evan Stanley, male   DOB: Dec 06, 1940, 75 y.o.   MRN: [id number] Wife can't get in until Wed am to do family education. She is training her replacement at work to cover for her while she is home with pt. Will schedule family education for 9:00 am Wed to have wife see pt in therapies and then discharge him home.

## 2015-12-03 ENCOUNTER — Inpatient Hospital Stay (HOSPITAL_COMMUNITY): Payer: BLUE CROSS/BLUE SHIELD | Admitting: Physical Therapy

## 2015-12-03 ENCOUNTER — Inpatient Hospital Stay (HOSPITAL_COMMUNITY): Payer: BLUE CROSS/BLUE SHIELD

## 2015-12-03 ENCOUNTER — Inpatient Hospital Stay (HOSPITAL_COMMUNITY): Payer: BLUE CROSS/BLUE SHIELD | Admitting: Speech Pathology

## 2015-12-03 ENCOUNTER — Inpatient Hospital Stay (HOSPITAL_COMMUNITY): Payer: Medicare Other | Admitting: Occupational Therapy

## 2015-12-03 LAB — GLUCOSE, CAPILLARY
GLUCOSE-CAPILLARY: 181 mg/dL — AB (ref 65–99)
GLUCOSE-CAPILLARY: 165 mg/dL — AB (ref 65–99)
Glucose-Capillary: 147 mg/dL — ABNORMAL HIGH (ref 65–99)
Glucose-Capillary: 137 mg/dL — ABNORMAL HIGH (ref 65–99)

## 2015-12-03 NOTE — Progress Notes (Signed)
Occupational Therapy Session Note  Patient Details  Name: Evan Stanley MRN: [id number] Date of Birth: 1940/11/20  Today's Date: 12/03/2015 OT Individual Time: 8099-8338 OT Individual Time Calculation (min): 45 min    Short Term Goals: Week 2:  OT Short Term Goal 1 (Week 2): STG = LTG d/t short remaining LOS  Skilled Therapeutic Interventions/Progress Updates: ADL-retraining with at shower level with focus on discharge planning, transfers, safety awareness, and adapted bathing/dressing using DME.  Pt received seated in w/c and requesting to shower.   Without cues, pt doffed AFO and socks prior to ambulating to bathroom.   OT reiterated recommendation to maintain AFO and ambulate into bathroom to remove lower body clothing after transferring onto tub bench, however pt continues to request prompts during BADL for recommended sequence of task.   Poor carry over of sequencing noted.   Pt undressed and then bathed seated on bench.   Pt recovered to w/c provided to perform toilet transfer and return to w/c to dress and groom at sink.   Pt dressed unassisted sitting and standing at sink but requires cues for best positioning.    Overall pt performs self-care w/o physical assist but requires cues for safety, sequencing, and problem-solving .         Therapy Documentation Precautions:  Precautions Precautions: Fall Restrictions Weight Bearing Restrictions: No   Vital Signs: Therapy Vitals Pulse Rate: 67 BP: (!) 121/50 mmHg   Pain: Pain Assessment Pain Assessment: No/denies pain Pain Score: 0-No pain  See Function Navigator for Current Functional Status.   Therapy/Group: Individual Therapy  [place] 12/03/2015, 11:09 AM

## 2015-12-03 NOTE — Progress Notes (Signed)
Physical Therapy Session Note  Patient Details  Name: Evan Stanley MRN: [id number] Date of Birth: [birth year]  Today's Date: 12/03/2015 PT Individual Time: 0901-1000 PT Individual Time Calculation (min): 59 min   Short Term Goals: Week 2:  PT Short Term Goal 1 (Week 2): STG = LTG due to d/c date.   Skilled Therapeutic Interventions/Progress Updates:    Pt received in w/c & agreeable to PT, denying c/o pain. Pt reports he received his personal L AFO yesterday & already had it donned for session. PT adjusted pt's personal RW to appropriate height & pt ambulated >150 ft room>ortho gym. Educated pt on single step negotiation with RW & pt able to complete first trial with supervision & cuing, & 2nd trial with supervision but pt able to recall proper sequence independently. Pt then completed car transfer at SUV simulated seat height with supervision/mod I & using simulated handle. Pt negotiated 3 steps x 4 consecutive trials with BUE support & mod I for strengthening. Pt able to perform correct sequencing, i.e. Ascend leading with RLE & descend leading with LLE after initial cuing from therapist. Pt completed Berg balance test & scored 46/56; educated pt on interpretation of score. Patient demonstrates increased fall risk as noted by score of 46/56 on Berg Balance Scale.  (<36= high risk for falls, close to 100%; 37-45 significant >80%; 46-51 moderate >50%; 52-55 lower >25%). Discussed benefits of outpatient PT versus HHPT with pt & pt weary of going to outpatient. Educated pt on importance of continued therapy upon d/c to continue to improve his balance, strength & safety with all functional mobility. Pt reported need to use restroom so ambulated to bathroom & completed toilet transfer with mod I, (+) BM & void. In rehab apartment pt able to negotiate small spaces with RW & Mod I, as well as bed mobility & furniture transfers with mod I. Requested pt have wife measure height of bed at home tonight so pt can  practice with wife tomorrow & pt agreeable. Pt ambulated back to room & left in w/c with all needs within reach at end of session.    Therapy Documentation Precautions:  Precautions Precautions: Fall Restrictions Weight Bearing Restrictions: No  Pain: Pain Assessment Pain Assessment: No/denies pain  Balance: Balance Balance Assessed: Yes Standardized Balance Assessment Standardized Balance Assessment: Berg Balance Test Berg Balance Test Sit to Stand: Able to stand without using hands and stabilize independently Standing Unsupported: Able to stand safely 2 minutes Sitting with Back Unsupported but Feet Supported on Floor or Stool: Able to sit safely and securely 2 minutes Stand to Sit: Sits safely with minimal use of hands Transfers: Able to transfer safely, minor use of hands Standing Unsupported with Eyes Closed: Able to stand 10 seconds safely Standing Ubsupported with Feet Together: Able to place feet together independently and stand 1 minute safely From Standing, Reach Forward with Outstretched Arm: Can reach forward >12 cm safely (5") From Standing Position, Pick up Object from Floor: Able to pick up shoe safely and easily From Standing Position, Turn to Look Behind Over each Shoulder: Looks behind from both sides and weight shifts well Turn 360 Degrees: Able to turn 360 degrees safely but slowly (9 seconds turning to L, 7 seconds turning to R) Standing Unsupported, Alternately Place Feet on Step/Stool: Able to complete 4 steps without aid or supervision (completed 8 steps in 28 seconds) Standing Unsupported, One Foot in Front: Able to take small step independently and hold 30 seconds Standing on One Leg:  Tries to lift leg/unable to hold 3 seconds but remains standing independently Total Score: 46   See Function Navigator for Current Functional Status.   Therapy/Group: Individual Therapy  Waunita Schooner 12/03/2015, 9:27 AM

## 2015-12-03 NOTE — Progress Notes (Signed)
Social Work Patient ID: Evan Stanley, male   DOB: Dec 17, 1940, 75 y.o.   MRN: [id number] Insurance update faxed to [place] case manager for continued approval until discharge tomorrow. Await insurance response.

## 2015-12-03 NOTE — Progress Notes (Signed)
Social Work  Discharge Note  The overall goal for the admission was met for:   Discharge location: Yes-HOME WITH WIFE WHO IS TAKING TIME OFF WORK TO PROVIDE 24 HR SUPERVISION  Length of Stay: Yes-12 DAYS  Discharge activity level: Yes-SUPERVISION WITH CUES  Home/community participation: Yes  Services provided included: MD, RD, PT, OT, SLP, RN, CM, Pharmacy and SW  Financial Services: Medicare and Private Insurance: BCBS  Follow-up services arranged: Home Health: [facility]-PT,OT,RN, DME: [facility]-ROLLING WALKER, BEDSIDE COMMODE & TUB SEAT and Patient/Family has no preference for HH/DME agencies  Comments (or additional information):WIFE WAS HERE DAY OF DISCHARGE TO GO THROUGH FAMILY EDUCATION, AT TIMES DENIES PT'S DEFICITS. PT IS A HIGH FALL RISK DUE TO BALANCE ISSUES AND NOT ALWAYS FOLLOWING DIRECTIONS. WIFE WAS MADE AWARE OF THIS. SHE IS OFF WORK FOR A SHORT TIME, BUT THEN PLANS TO GO BACK UNSURE WHO WILL PROVIDE SUPERIVISION THEN.  Patient/Family verbalized understanding of follow-up arrangements: Yes  Individual responsible for coordination of the follow-up plan: [name]-WIFE  Confirmed correct DME delivered: ,  G 12/03/2015    ,  G 

## 2015-12-03 NOTE — Progress Notes (Signed)
[facility] PHYSICAL MEDICINE & REHABILITATION     PROGRESS NOTE  Subjective/Complaints:  Patient sitting up in his recliner this morning. He notes he is doing better and is looking forward to being discharged home soon.  ROS: Denies CP, SOB, nausea, vomiting, diarrhea.  Objective: Vital Signs: Blood pressure 121/50, pulse 67, temperature 98.2 F (36.8 C), temperature source Oral, resp. rate 17, height 5' 5"  (1.651 m), weight 77.202 kg (170 lb 3.2 oz), SpO2 96 %. No results found.  Recent Labs  12/02/15 1049  WBC 8.7  HGB 13.8  HCT 41.0  PLT 300    Recent Labs  12/02/15 1049  NA 139  K 4.0  CL 101  GLUCOSE 137*  BUN 28*  CREATININE 2.00*  CALCIUM 10.1   CBG (last 3)   Recent Labs  12/02/15 1618 12/02/15 2134 12/03/15 0643  GLUCAP 150* 121* 147*    Wt Readings from Last 3 Encounters:  12/03/15 77.202 kg (170 lb 3.2 oz)  11/22/15 78 kg (171 lb 15.3 oz)  11/08/15 80.196 kg (176 lb 12.8 oz)    Physical Exam:  BP 121/50 mmHg  Pulse 67  Temp(Src) 98.2 F (36.8 C) (Oral)  Resp 17  Ht 5' 5"  (1.651 m)  Wt 77.202 kg (170 lb 3.2 oz)  BMI 28.32 kg/m2  SpO2 96% Constitutional: He appears well-developed. NAD. Vital signs reviewed. HENT: Normocephalic. Atraumatic Eyes: EOM and conjunctiva are normal.  Cardiovascular: Cardiac rate control. + Murmur  Respiratory: Effort normal and breath sounds normal. No respiratory distress.  GI: Soft. Bowel sounds are normal. He exhibits no distension.  Musculoskeletal: No edema.  Neurological: He is alert and oriented Motor: 5/5 RUE/RLE LUE/LLE: 4+/5 proximal to distal. Mild ataxia left upper extremity (Improving, now stable).  Skin: Skin is warm and dry. Folliculitis right gluteal area 2 (both, small areas of opening with mild drainage) Psych: mood is pleasant and cooperative. Normal mood and affect.  Assessment/Plan: 1. Functional deficits secondary to right frontal temporal infarct which require 3+ hours per day  of interdisciplinary therapy in a comprehensive inpatient rehab setting. Physiatrist is providing close team supervision and 24 hour management of active medical problems listed below. Physiatrist and rehab team continue to assess barriers to discharge/monitor patient progress toward functional and medical goals.  Function:  Bathing Bathing position   Position: Shower  Bathing parts Body parts bathed by patient: Right arm, Left arm, Chest, Abdomen, Front perineal area, Buttocks, Right upper leg, Left upper leg, Right lower leg, Left lower leg, Back Body parts bathed by helper: Back  Bathing assist Assist Level: Supervision or verbal cues      Upper Body Dressing/Undressing Upper body dressing   What is the patient wearing?: Pull over shirt/dress     Pull over shirt/dress - Perfomed by patient: Thread/unthread right sleeve, Thread/unthread left sleeve, Put head through opening, Pull shirt over trunk          Upper body assist Assist Level: Set up   Set up : To obtain clothing/put away  Lower Body Dressing/Undressing Lower body dressing   What is the patient wearing?: Pants, Underwear, Ted Hose, Shoes, AFO Underwear - Performed by patient: Thread/unthread right underwear leg, Thread/unthread left underwear leg, Pull underwear up/down Underwear - Performed by helper: Pull underwear up/down Pants- Performed by patient: Thread/unthread left pants leg, Thread/unthread right pants leg, Pull pants up/down, Fasten/unfasten pants Pants- Performed by helper: Pull pants up/down     Socks - Performed by patient: Don/doff right sock, Don/doff left  sock   Shoes - Performed by patient: Don/doff right shoe, Fasten right, Fasten left, Don/doff left shoe Shoes - Performed by helper:  (with AFO) AFO - Performed by patient: Don/doff left AFO   TED Hose - Performed by patient: Don/doff right TED hose, Don/doff left TED hose TED Hose - Performed by helper: Don/doff right TED hose  Lower body  assist Assist for lower body dressing: Supervision or verbal cues   Set up : To obtain clothing/put away  Toileting Toileting   Toileting steps completed by patient: Adjust clothing prior to toileting, Performs perineal hygiene, Adjust clothing after toileting Toileting steps completed by helper: Adjust clothing prior to toileting, Performs perineal hygiene, Adjust clothing after toileting (per Malachi Carl, NT report) Toileting Assistive Devices: Grab bar or rail  Toileting assist Assist level: Supervision or verbal cues   Transfers Chair/bed transfer   Chair/bed transfer method: Stand pivot Chair/bed transfer assist level: Supervision or verbal cues Chair/bed transfer assistive device: Medical sales representative     Max distance: 126f Assist level: Supervision or verbal cues   Wheelchair   Type: Manual Max wheelchair distance: 150 Assist Level: Touching or steadying assistance (Pt > 75%)  Cognition Comprehension Comprehension assist level: Follows complex conversation/direction with no assist  Expression Expression assist level: Expresses basic needs/ideas: With no assist  Social Interaction Social Interaction assist level: Interacts appropriately with others with medication or extra time (anti-anxiety, antidepressant).  Problem Solving Problem solving assist level: Solves basic problems with no assist  Memory Memory assist level: Recognizes or recalls 90% of the time/requires cueing < 10% of the time    Medical Problem List and Plan: 1. Left hemiparesthesias with balance deficits mild cognitive impairment secondary to right frontoparietal infarct after carotid angiogram. Follow-up 4 weeks outpatient vascular surgery to consider carotid enterectomy  Continue CIR  Will see patient in 1-2 weeks for transitional care management. 2. DVT Prophylaxis/Anticoagulation: Subcutaneous Lovenox. Monitor platelet counts and any signs of bleeding 3. Pain Management: Oxycodone  as needed -kpad for left neck, ROM with therapy 4. Diabetes mellitus with peripheral neuropathy.   Glucophage 1000 mg twice a day, DC'd on 5/15 due to GFR.   Check blood sugars before meals and at bedtime. Diabetic teaching  Remains fairly well controlled at present 5. Neuropsych: This patient is capable of making decisions on his own behalf. 6. Skin/Wound Care: Routine skin checks 7. Fluids/Electrolytes/Nutrition: Routine I&O's  Mild hyponatremia: resolved. 139 on 5/15 8. Hypertension.   Norvasc 10 mg daily  Hydrochlorothiazide 25 mg daily, DC'd on 5/16  Lisinopril 40 mg daily  Toprol-XL 100 mg daily.     -controlled at present, will need to make further adjustments and possible wean as outpatient 9. Diastolic congestive heart failure. Monitor for any signs of fluid overload. Weigh patient daily 10.CRI. Baseline creatinine 1.40-1.48.   Creatinine 2.0 on 5/15, patient will likely need to follow-up as outpatient  Continue encourage by mouth fluid intake 11. Hyperlipidemia. Pravachol 12. Leukocytosis: Resolved  WBCs 8.7 on 5/15  Afebrile, no signs and symptoms of infection.  Will continue to monitor 13. Constipation  Abdominal x-ray reviewed from 5/9-normal   Resolved 14. Insomnia: Resolved  Patient does not wish to take any medications 15. Folliculitis 2  Right gluteal area, both slightly open with drainage, improving  Keflex initiated on 5/12, will continue for time being  Will also add Santyl dressing to distal wound  If they do not continue to improve, may need I&D as outpatient  LOS (Days) 11 A FACE TO FACE EVALUATION WAS PERFORMED  Ankit Lorie Phenix 12/03/2015 9:50 AM

## 2015-12-03 NOTE — Discharge Summary (Signed)
NAMEMarland Stanley  KESEAN, SERVISS NO.:  [id number]  MEDICAL RECORD NO.:  [id number]  LOCATION:  [id number]                        FACILITY:  [facility]  PHYSICIAN:  Delice Lesch, MD        DATE OF BIRTH:  August 21, 1940  DATE OF ADMISSION:  11/21/2015 DATE OF DISCHARGE:  12/04/2015                              DISCHARGE SUMMARY   DISCHARGE DIAGNOSES: 1. Right frontoparietal infarct after carotid angiogram. 2. Subcutaneous Lovenox for deep venous thrombosis prophylaxis. 3. Diabetes mellitus, peripheral neuropathy. 4. Hypertension. 5. Diastolic congestive heart failure. 6. Chronic renal insufficiency. 7. Hyperlipidemia. 8. Constipation. 9. Folliculitis right gluteal area x2.  HISTORY OF PRESENT ILLNESS:  This is a 75 year old right-handed male, history of hypertension, chronic renal insufficiency with creatinine 1.40 to 1.48, diabetes mellitus, atrial fibrillation, maintained on aspirin and a TIA in the past.  Lives with spouse, independent prior to admission.  Wife works during the day.  Presented on Nov 18, 2015, with known carotid artery stenosis diagnosed in April 2016, by Cardiology Services.  Right ICA greater than 80%, left ICA occluded.  Underwent elective right carotid angiogram on Nov 18, 2015, per Dr. Bridgett Larsson for workup and plan carotid surgery.  Noted post angiogram with left-sided weakness.  Code stroke was called.  CT of the head showed no acute cortically based infarct or acute abnormalities.  MRI of the brain showed scattered foci of acute nonhemorrhagic infarct involving the posterior right frontal lobe, right parietal lobe.  Neurology consulted. The patient did not receive tPA.  Echocardiogram with ejection fraction of 55% without PFO.  Maintained on aspirin 81 mg daily.  Subcutaneous Lovenox for DVT prophylaxis.  Plan follow up Vascular Surgery in 4 weeks as outpatient to consider carotid surgery.  Maintained on a regular consistency diet.  The patient was admitted for a  comprehensive rehab program.  PAST MEDICAL HISTORY:  See discharge diagnoses.  SOCIAL HISTORY:  Independent prior to admission, living with wife. Functional status upon admission to rehab services was moderate assist, 12 feet rolling walker; moderate assist, stand pivot transfers; min max assist, activities of daily living.  PHYSICAL EXAMINATION:  VITAL SIGNS:  Blood pressure 128/61, pulse 64, temperature 98, respirations 18. GENERAL:  This was an alert male, oriented to person, place, and time. Fair awareness of deficits.  Left field cut that was inconsistent. LUNGS:  Clear to auscultation. CARDIAC:  Regular rate and rhythm.  No murmur. ABDOMEN:  Soft, nontender.  Good bowel sounds.  REHABILITATION HOSPITAL COURSE:  The patient was admitted to Inpatient Rehab Services with therapies initiated on a 3-hour daily basis, consisting of physical therapy, occupational therapy, speech therapy, and rehabilitation nursing.  The following issues were addressed during patient's rehabilitation stay.  Pertaining to Mr. Divita right frontoparietal infarct remained stable, maintained on aspirin therapy. Subcutaneous Lovenox for DVT prophylaxis.  No bleeding episodes.  Plan would be to follow up Vascular Surgery in 4 weeks outpatient to consider carotid surgery.  Diabetes mellitus, peripheral neuropathy.  Blood sugars overall well maintained.  He continued on diabetic diet.  Blood pressures monitored on Norvasc, lisinopril, and Toprol.  No orthostatic changes.  He would follow up with his primary MD.  Patient showed no signs of fluid overload.  Chronic renal insufficiency, baseline creatinine 1.40 to 1.48 and monitored.  Bouts of constipation resolved with laxative assistance.  The patient with a bout of folliculitis x2 right gluteal area. Santyl dressing to distal wound and  placed on Keflex Nov 29, 2015, through Dec 13, 2015, remained afebrile. Consider outpatient I&D if no improvement.  The  patient received weekly collaborative interdisciplinary team conferences to discuss estimated length of stay, family teaching, any barriers to his discharge.  He was ambulating 175 feet x2, supervision rolling walker; step training with rolling walker, close supervision.  Gait training with large base quad cane, 30 feet minimal assistance; short base quad cane, 15 feet minimal assistance, gather belongings for activities of daily living and homemaking. Working with dynamic balance with stepping forward and backwards. Speech Therapy follow up.  The patient with complete complex problem solving for mildly complex task with moderate assist verbal cues, 90% accurate.  He utilized his daily schedule with modified independence to inform therapist of changes in schedules.  Full family teaching was completed and plan discharge to home.  DISCHARGE MEDICATIONS:  Included: 1. Norvasc 10 mg p.o. daily. 2. Aspirin 81 mg p.o. daily. 3. Keflex 500 mg every 12 hours through Dec 13, 2015, stop. 4. Lisinopril 40 mg p.o. daily. 5. Toprol-XL 100 mg p.o. daily. 6. Oxycodone 1-2 tablets every 4 hours as needed for pain, dispense of     60 tablets. 7. Protonix 40 mg p.o. daily. 8. Pravachol 40 mg p.o. daily.  DIET:  His diet was a diabetic diet.   Dressing changes. Home health nurse/santyl dressing to gluteal wound changes needed  SPECIAL INSTRUCTIONS:  Santyl ointment to gluteal fold, The patient will follow up with Delice Lesch, MD Office to call for appointment; Conrad Churchill, MD, followup for plan carotid surgery; Pramod P. Leonie Man, MD, Neurology Service, 1 month call for appointment; Dr. Alain Honey, Medical Management. Plan follow up Vascular Surgery in 4 weeks as outpatient to consider carotid surgery    Lauraine Rinne, P.A.   ______________________________ Delice Lesch, MD    DA/MEDQ  D:  12/03/2015  T:  12/03/2015  Job:  [id number]  cc:   Conrad [place], MD Pramod P. Leonie Man, MD Dr. Alain Honey

## 2015-12-03 NOTE — Progress Notes (Signed)
Occupational Therapy Session Note  Patient Details  Name: Evan Stanley MRN: [id number] Date of Birth: 04/16/1941  Today's Date: 12/03/2015 OT Individual Time: 0800-0830 OT Individual Time Calculation (min): 30 min    Short Term Goals: Week 1:  OT Short Term Goal 1 (Week 1): Pt will complete toilet transfers with supervision with LRAD OT Short Term Goal 1 - Progress (Week 1): Met OT Short Term Goal 2 (Week 1): Pt will complete walk-in shower transfers with min assist with LRAD OT Short Term Goal 2 - Progress (Week 1): Met OT Short Term Goal 3 (Week 1): Pt will complete LB dressing with supervision OT Short Term Goal 3 - Progress (Week 1): Met OT Short Term Goal 4 (Week 1): Pt will complete toileting tasks with supervision OT Short Term Goal 4 - Progress (Week 1): Met Week 2:  OT Short Term Goal 1 (Week 2): STG = LTG d/t short remaining LOS  Skilled Therapeutic Interventions/Progress Updates:    1:1 focus on safe functional ambulation and mobilty around room to gather items for showering and dressing, toileting, toilet transfer, and grooming in standing at the sink.  Pt still requires min VC throughout session for RW safety especially with turns.  Pt demonstrates intellectual awareness but still requires cues for emergent awareness.   Therapy Documentation Precautions:  Precautions Precautions: Fall Restrictions Weight Bearing Restrictions: No Pain: Pain Assessment Pain Assessment: No/denies pain Pain Score: 0-No pain  See Function Navigator for Current Functional Status.   Therapy/Group: Individual Therapy  [name], [name] 12/03/2015, 10:40 AM

## 2015-12-03 NOTE — Progress Notes (Signed)
Speech Language Pathology Daily Session Note  Patient Details  Name: Evan Stanley MRN: [id number] Date of Birth: 1941/03/23  Today's Date: 12/03/2015 SLP Individual Time: 1405-1500 SLP Individual Time Calculation (min): 55 min  Short Term Goals: Week 1: SLP Short Term Goal 1 (Week 1): Patient will complete complex problem solving for mildly complex tasks with Mod A verbal cues and 90% accuracy.  SLP Short Term Goal 2 (Week 1): Patient will demonstrate anticipatory awareness and identify 3 tasks that he can partiicpate in at home safely with Mod A verbal cues.  SLP Short Term Goal 3 (Week 1): Patient will utilize external memory aids to recall new, daily information with Mod A verbal cues.  SLP Short Term Goal 4 (Week 1): Patient will complete abstract reasoning with Mod A verbal cues and 90% accuracy.   Skilled Therapeutic Interventions:  Pt was seen for skilled ST targeting cognitive goals.  Pt reported "getting caught" standing up unassisted in his room.  He verbalized he knows that he is not supposed to get up without help but chooses to do so anyway at times.  SLP reviewed and reinforced safety recommendations and rationale.  SLP administered the Cognistat to measure progress from initial evaluation.  Pt scored below normal limits on subtests for attention, visuospatial/constructional problem solving, memory, and calculations.  Discussed with pt results of testing and recommendations for additional ST follow up at next venue.  Pt is resistant to recommendations due to decreased anticipatory awareness of deficits but remained pleasantly interactive with therapist .  Pt propelled himself in wheelchair back to room with mod I and was left with call bell within reach.  Continue per current plan of care.    Function:  Eating Eating                 Cognition Comprehension Comprehension assist level: Follows complex conversation/direction with no assist  Expression   Expression assist  level: Expresses complex ideas: With no assist  Social Interaction Social Interaction assist level: Interacts appropriately with others - No medications needed.  Problem Solving Problem solving assist level: Solves basic 90% of the time/requires cueing < 10% of the time  Memory Memory assist level: Recognizes or recalls 90% of the time/requires cueing < 10% of the time    Pain Pain Assessment Pain Assessment: No/denies pain  Therapy/Group: Individual Therapy  Nickola Lenig, Selinda Orion 12/03/2015, 4:24 PM

## 2015-12-03 NOTE — Discharge Summary (Signed)
Discharge summary job [id number]

## 2015-12-04 ENCOUNTER — Inpatient Hospital Stay (HOSPITAL_COMMUNITY): Payer: Medicare Other | Admitting: Speech Pathology

## 2015-12-04 ENCOUNTER — Encounter (HOSPITAL_COMMUNITY): Payer: Medicare Other | Admitting: Occupational Therapy

## 2015-12-04 ENCOUNTER — Ambulatory Visit (HOSPITAL_COMMUNITY): Payer: Medicare Other | Admitting: Physical Therapy

## 2015-12-04 DIAGNOSIS — I10 Essential (primary) hypertension: Secondary | ICD-10-CM | POA: Insufficient documentation

## 2015-12-04 LAB — GLUCOSE, CAPILLARY: Glucose-Capillary: 134 mg/dL — ABNORMAL HIGH (ref 65–99)

## 2015-12-04 MED ORDER — OXYCODONE-ACETAMINOPHEN 5-325 MG PO TABS: 1.0000 | ORAL_TABLET | ORAL | Status: DC | PRN

## 2015-12-04 MED ORDER — AMLODIPINE BESYLATE 10 MG PO TABS: 10.0000 mg | ORAL_TABLET | Freq: Every day | ORAL | Status: DC

## 2015-12-04 MED ORDER — CEPHALEXIN 500 MG PO CAPS: 500.0000 mg | ORAL_CAPSULE | Freq: Two times a day (BID) | ORAL | Status: DC

## 2015-12-04 MED ORDER — PRAVASTATIN SODIUM 40 MG PO TABS: 40.0000 mg | ORAL_TABLET | Freq: Every day | ORAL | Status: DC

## 2015-12-04 MED ORDER — METOPROLOL SUCCINATE ER 100 MG PO TB24: 100.0000 mg | ORAL_TABLET | Freq: Every day | ORAL | Status: DC

## 2015-12-04 MED ORDER — LISINOPRIL 40 MG PO TABS: 40.0000 mg | ORAL_TABLET | Freq: Every day | ORAL | Status: DC

## 2015-12-04 MED ORDER — PANTOPRAZOLE SODIUM 40 MG PO TBEC: 40.0000 mg | DELAYED_RELEASE_TABLET | Freq: Every day | ORAL | Status: DC

## 2015-12-04 NOTE — Progress Notes (Signed)
Physical Therapy Discharge Summary  Patient Details  Name: Evan Stanley MRN: [id number] Date of Birth: 1941/01/15  Today's Date: 12/04/2015 PT Individual Time: 1030-1130 PT Individual Time Calculation (min): 60 min    Patient has met 11 of 11 long term goals due to improved activity tolerance, improved balance, improved postural control, increased strength, improved attention and improved awareness.  Patient to discharge at an ambulatory level Supervision.   Patient's care partner is independent to provide the necessary cognitive assistance at discharge.  Reasons goals not met: n/a  Recommendation:  Patient will benefit from ongoing skilled PT services in home health setting to continue to advance safe functional mobility, address ongoing impairments in impaired muscle activation in LLE, decreased balance & righting reactions, gait training with LRAD, decreased safety awareness, and minimize fall risk.  Equipment: L AFO, RW  Reasons for discharge: treatment goals met  Patient/family agrees with progress made and goals achieved: Yes  Skilled PT treatment: Pt received in w/c with wife, [name], present & agreeable to PT, pt denying c/o pain. Manual testing completed, please see below for details. Pt completed sit<>stand, furniture, & car transfers all with supervision A. Pt able to ambulate >150 ft with RW, LAFO & supervision A with therapist educating wife on need to be with pt when ambulating and to walk on L side of pt. Instructed pt & wife for pt to ambulate at slower pace to help prevent pt from becoming distracted which can cause LLE foot drag; also educated them on need for pt to wear LAFO & use RW at all times when standing and/or ambulating. Pt able to transfer in/out of bed at elevated height to simulate home environment, negotiate 12 steps with B rails & supervision, and negotiate curb with RW & supervision. When negotiating curb pt able to independently recall proper sequencing for  task. Instructed pt & wife on falling at home & situations when needing to call EMS; wife & pt having difficulty recalling situations, only naming 3 out of 4 therapist educated them on. Pt able to complete floor transfer with supervision by pushing up on couch. Instructed pt & wife to assess skin after removing L AFO & potential for pt to need to wear long sock with orthosis. Educated pt & wife on need to remove all throw rugs & to have clear areas for pt to walk, as well as pt's need for supervision during activity 2/2 impulsiveness.At end of session pt & wife verbalized understanding & feel as though they have good safety awareness & are prepared for d/c home. Pt left in w/c with wife present & all needs within reach.   PT Discharge Precautions/Restrictions Precautions Precautions: None Restrictions Weight Bearing Restrictions: No  Pain Pain Assessment Pain Assessment: No/denies pain Vision/Perception    Pt reports hx of impaired vision in L eye from previous stroke.  Cognition Overall Cognitive Status: Within Functional Limits for tasks assessed Arousal/Alertness: Awake/alert Orientation Level: Oriented X4;Oriented to person;Oriented to place;Oriented to time;Oriented to situation Problem Solving: Impaired Behaviors: Impulsive Safety/Judgment: Impaired Sensation Sensation Light Touch: Appears Intact (BLE) Proprioception: Appears Intact (BLE) Coordination Finger Nose Finger Test:  (BUE equal) Heel Shin Test:  (3 seconds RLE, 5 seconds LLE) Motor  Motor Motor:  ((-) clonus BLE)  Mobility Bed Mobility Bed Mobility: Rolling Right;Rolling Left;Supine to Sit;Sit to Supine (in regular bed) Rolling Right: 6: Modified independent (Device/Increase time) Rolling Left: 6: Modified independent (Device/Increase time) Supine to Sit: 6: Modified independent (Device/Increase time) Sit to Supine: 6: Modified  independent (Device/Increase time) Transfers Transfers: Yes Sit to Stand: 5:  Supervision Stand to Sit: 5: Supervision Stand Pivot Transfers: 5: Supervision (with RW) Locomotion  Ambulation Ambulation: Yes Ambulation/Gait Assistance: 5: Supervision Ambulation Distance (Feet):  (>150 ft) Assistive device: Rolling walker (LLE AFO) Gait Gait: Yes Gait Pattern: Poor foot clearance - left;Decreased dorsiflexion - left Stairs / Additional Locomotion Stairs: Yes Stairs Assistance: 5: Supervision (pt able to negotiate single step with RW & supervision, & 12 steps with B rails & supervision) Stair Management Technique: Two rails Number of Stairs: 12 Height of Stairs: 6 (inches) Ramp: 5: Supervision Curb: 5: Psychiatric nurse: Yes Wheelchair Assistance: 5: Investment banker, operational Details: Verbal cues for Marketing executive: Both upper extremities Wheelchair Parts Management: Supervision/cueing Distance: 150 ft   Event organiser Standing - Balance Support: No upper extremity supported Static Standing - Level of Assistance: 5: Stand by assistance (during functional task) Extremity Assessment      RLE Assessment RLE Assessment: Exceptions to [facility] RLE AROM (degrees) Overall AROM Right Lower Extremity: Within functional limits for tasks assessed RLE Strength Right Hip Flexion: 5/5 Right Knee Flexion: 5/5 Right Knee Extension: 5/5 Right Ankle Dorsiflexion: 5/5 LLE Assessment LLE Assessment: Exceptions to WFL LLE AROM (degrees) Overall AROM Left Lower Extremity: Within functional limits for tasks assessed LLE Strength Left Hip Flexion: 4/5 Left Knee Flexion: 4/5 Left Knee Extension: 5/5 Left Ankle Dorsiflexion: 4/5   See Function Navigator for Current Functional Status.  Waunita Schooner 12/04/2015, 5:27 PM

## 2015-12-04 NOTE — Plan of Care (Signed)
Problem: RH Balance Goal: LTG Patient will maintain dynamic standing balance (PT) LTG: Patient will maintain dynamic standing balance with assistance during mobility activities (PT)  Outcome: Completed/Met Date Met:  12/04/15 Without BUE support during functional task  Problem: RH Bed to Chair Transfers Goal: LTG Patient will perform bed/chair transfers w/assist (PT) LTG: Patient will perform bed/chair transfers with assistance, with/without cues (PT).  With RW  Problem: RH Furniture Transfers Goal: LTG Patient will perform furniture transfers w/assist (OT/PT LTG: Patient will perform furniture transfers with assistance (OT/PT).  Outcome: Completed/Met Date Met:  12/04/15 With RW  Problem: RH Ambulation Goal: LTG Patient will ambulate in controlled environment (PT) LTG: Patient will ambulate in a controlled environment, # of feet with assistance (PT).  Outcome: Completed/Met Date Met:  12/04/15 150 ft with RW Goal: LTG Patient will ambulate in home environment (PT) LTG: Patient will ambulate in home environment, # of feet with assistance (PT).  Outcome: Completed/Met Date Met:  12/04/15 50 ft with RW Goal: LTG Patient will ambulate in community environment (PT) LTG: Patient will ambulate in community environment, # of feet with assistance (PT).  Outcome: Completed/Met Date Met:  12/04/15 150 ft with RW  Problem: RH Wheelchair Mobility Goal: LTG Patient will propel w/c in controlled environment (PT) LTG: Patient will propel wheelchair in controlled environment, # of feet with assist (PT)  Outcome: Adequate for Discharge 150 ft with supervision; pt to d/c at ambulatory level  Problem: RH Stairs Goal: LTG Patient will ambulate up and down stairs w/assist (PT) LTG: Patient will ambulate up and down # of stairs with assistance (PT)  Outcome: Completed/Met Date Met:  12/04/15 1 step with RW  Problem: RH Car Transfers Goal: LTG Patient will perform car transfers with assist  (PT) LTG: Patient will perform car transfers with assistance (PT).  Outcome: Completed/Met Date Met:  12/04/15 With RW

## 2015-12-04 NOTE — Progress Notes (Signed)
Patient and patient's wife given discharge information from [facility]. PA, and all questions answered. All belongings packed by patient's wife, and patient assisted to car via wheelchair with the assistance of nurse tech. [place]

## 2015-12-04 NOTE — Discharge Instructions (Signed)
Inpatient Rehab Discharge Instructions  Evan Stanley Discharge date and time: No discharge date for patient encounter.   Activities/Precautions/ Functional Status: Activity: activity as tolerated Diet: diabetic diet Wound Care: none needed Functional status:  ___ No restrictions     ___ Walk up steps independently ___ 24/7 supervision/assistance   ___ Walk up steps with assistance ___ Intermittent supervision/assistance  ___ Bathe/dress independently ___ Walk with walker     _x STROKE/TIA DISCHARGE INSTRUCTIONS SMOKING Cigarette smoking nearly doubles your risk of having a stroke & is the single most alterable risk factor  If you smoke or have smoked in the last 12 months, you are advised to quit smoking for your health.  Most of the excess cardiovascular risk related to smoking disappears within a year of stopping.  Ask you doctor about anti-smoking medications  [place] Quit Line: 1-800-QUIT NOW  Free Smoking Cessation Classes [phone]  CHOLESTEROL Know your levels; limit fat & cholesterol in your diet  Lipid Panel     Component Value Date/Time   CHOL 176 09/20/2015 0807   TRIG 147 09/20/2015 0807   HDL 37* 09/20/2015 0807   CHOLHDL 4.8 09/20/2015 0807   LDLCALC 110* 09/20/2015 0807      Many patients benefit from treatment even if their cholesterol is at goal.  Goal: Total Cholesterol (CHOL) less than 160  Goal:  Triglycerides (TRIG) less than 150  Goal:  HDL greater than 40  Goal:  LDL (LDLCALC) less than 100   BLOOD PRESSURE American Stroke Association blood pressure target is less that 120/80 mm/Hg  Your discharge blood pressure is:  BP: 125/68 mmHg  Monitor your blood pressure  Limit your salt and alcohol intake  Many individuals will require more than one medication for high blood pressure  DIABETES (A1c is a blood sugar average for last 3 months) Goal HGBA1c is under 7% (HBGA1c is blood sugar average for last 3 months)  Diabetes:     Lab Results    Component Value Date   HGBA1C 7.3 09/20/2015     Your HGBA1c can be lowered with medications, healthy diet, and exercise.  Check your blood sugar as directed by your physician  Call your physician if you experience unexplained or low blood sugars.  PHYSICAL ACTIVITY/REHABILITATION Goal is 30 minutes at least 4 days per week  Activity: Increase activity slowly, Therapies: Physical Therapy: Home Health Return to work:   Activity decreases your risk of heart attack and stroke and makes your heart stronger.  It helps control your weight and blood pressure; helps you relax and can improve your mood.  Participate in a regular exercise program.  Talk with your doctor about the best form of exercise for you (dancing, walking, swimming, cycling).  DIET/WEIGHT Goal is to maintain a healthy weight  Your discharge diet is: Diet Carb Modified Fluid consistency:: Thin; Room service appropriate?: Yes  liquids Your height is:  Height: 5' 5"  (165.1 cm) Your current weight is: Weight: 77 kg (169 lb 12.1 oz) Your Body Mass Index (BMI) is:     Following the type of diet specifically designed for you will help prevent another stroke.  Your goal weight range is:    Your goal Body Mass Index (BMI) is 19-24.  Healthy food habits can help reduce 3 risk factors for stroke:  High cholesterol, hypertension, and excess weight.  RESOURCES Stroke/Support Group:  Call [phone]   STROKE EDUCATION PROVIDED/REVIEWED AND GIVEN TO PATIENT Stroke warning signs and symptoms How to activate emergency  medical system (call 911). Medications prescribed at discharge. Need for follow-up after discharge. Personal risk factors for stroke. Pneumonia vaccine given:  Flu vaccine given:  My questions have been answered, the writing is legible, and I understand these instructions.  I will adhere to these goals & educational materials that have been provided to me after my discharge from the hospital.    __ Bathe/dress  with assistance ___ Walk Independently    ___ Shower independently ___ Walk with assistance    ___ Shower with assistance ___ No alcohol     ___ Return to work/school ________  Special Instructions: Follow-up with Dr. Posey Pronto rehabilitation services [address] [place]. Office to call for appointment   Follow-up with Dr. [name] of vascular surgery [phone] for plan carotid surgery in the upcoming month   [place]:    Home Health:   PT, OT, [place]    Date of last service:12/04/2015  Medical Equipment/Items Ordered:ROLLING WALKER, [place]   [phone]   GENERAL COMMUNITY RESOURCES FOR PATIENT/FAMILY: Support Groups:[facility] SUPPORT GROUP EVERY SECOND Thursday ON REHAB UNIT @ 3:00-4:00 PM QUESTIONS CONTACT KATIE [phone]  My questions have been answered and I understand these instructions. I will adhere to these goals and the provided educational materials after my discharge from the hospital.  Patient/Caregiver Signature _______________________________ Date __________  Clinician Signature _______________________________________ Date __________  Please bring this form and your medication list with you to all your follow-up doctor's appointments.

## 2015-12-04 NOTE — Progress Notes (Signed)
Occupational Therapy Discharge Summary  Patient Details  Name: Evan Stanley MRN: [id number] Date of Birth: [birth year]  Today's Date: 12/04/2015 OT Individual Time: 0900-0940 OT Individual Time Calculation (min): 40 min   1:1 Focus on family education with pt and pt's wife. Focus on safety awareness with functional ambulation and proper VC to provide for RW safety, safety around the house with a handout provided on safety recommendations, bathing and dressing recommendations for supervision. Pt verbalized and practiced transferring in and out of shower stall entering backwards.  Recommendation that pt remained seated in shower due to no grab bar with wife in bathroom. Discussed awareness deficits - pt and pt's wife verbalized understanding. Education provided on energy conservation with outings.   Patient has met 11 of 11 long term goals due to improved activity tolerance, improved balance, postural control and functional use of  LEFT upper and LEFT lower extremity.  Patient to discharge at overall Supervision level including functional ambulation with a RW and an AFO.   Patient's care partner is independent to provide the necessary physical and cognitive assistance at discharge.    Reasons goals not met: n/a  Recommendation:  Patient will benefit from ongoing skilled OT services in home health setting to continue to advance functional skills in the area of BADL, iADL and Reduce care partner burden.  Equipment: shower chair  Reasons for discharge: treatment goals met and discharge from hospital  Patient/family agrees with progress made and goals achieved: Yes  OT Discharge Precautions/Restrictions  Precautions Precautions: Fall Restrictions Weight Bearing Restrictions: No General   Vital Signs   Pain Pain Assessment Pain Assessment: No/denies pain ADL   Vision/Perception  Vision- History Baseline Vision/History:  (decr vision in left eye PTA) Wears Glasses: Reading  only Patient Visual Report: No change from baseline Perception Comments: improved attention to left side of body in functional tasks Praxis Praxis-Other Comments: improved LLE apraxia  Cognition Overall Cognitive Status: Within Functional Limits for tasks assessed Arousal/Alertness: Awake/alert Orientation Level: Oriented X4;Oriented to person;Oriented to place;Oriented to time;Oriented to situation Attention: Alternating Selective Attention: Appears intact Alternating Attention: Appears intact Memory: Impaired Memory Impairment: Decreased recall of new information Awareness: Impaired Awareness Impairment: Emergent impairment Problem Solving: Impaired Problem Solving Impairment: Functional complex Executive Function: Reasoning;Decision Making Reasoning: Impaired Reasoning Impairment: Functional complex Decision Making: Impaired Decision Making Impairment: Functional complex Behaviors: Impulsive Safety/Judgment: Impaired Comments: pt continues to get up unassisted, needs cues for safe walker use  Sensation Sensation Light Touch: Appears Intact Stereognosis: Appears Intact Hot/Cold: Appears Intact Proprioception: Appears Intact Coordination Gross Motor Movements are Fluid and Coordinated: Yes Fine Motor Movements are Fluid and Coordinated: No Finger Nose Finger Test:  (BUE equal) Heel Shin Test:  (3 seconds RLE, 5 seconds LLE) Motor  Motor Motor:  ((-) clonus BLE) Motor - Discharge Observations: improved overall functional mobility and strength Mobility  Bed Mobility Bed Mobility: Rolling Right;Rolling Left;Supine to Sit;Sit to Supine (in regular bed) Rolling Right: 6: Modified independent (Device/Increase time) Rolling Left: 6: Modified independent (Device/Increase time) Supine to Sit: 6: Modified independent (Device/Increase time) Sit to Supine: 6: Modified independent (Device/Increase time) Transfers Sit to Stand: 5: Supervision Stand to Sit: 5: Supervision   Trunk/Postural Assessment  Thoracic Assessment Thoracic Assessment: Within Functional Limits Lumbar Assessment Lumbar Assessment: Within Functional Limits Postural Control Righting Reactions: improved but still delayed Protective Responses: improved but still delayed  Balance Static Standing Balance Static Standing - Balance Support: No upper extremity supported Static Standing - Level of Assistance: 5: Stand by  assistance (during functional task) Extremity/Trunk Assessment RUE Assessment RUE Assessment: Within Functional Limits LUE Assessment LUE Assessment: Within Functional Limits (improved neck ROM and decr c/o pain)   See Function Navigator for Current Functional Status.  Fedor, [facility]ki [facility] 12/04/2015, 6:21 PM

## 2015-12-04 NOTE — Progress Notes (Signed)
[place] PHYSICAL MEDICINE & REHABILITATION     PROGRESS NOTE  Subjective/Complaints:  Pt sitting up in his chair.  He is exciting about being discharged today.   ROS: Denies CP, SOB, nausea, vomiting, diarrhea.  Objective: Vital Signs: Blood pressure 114/55, pulse 61, temperature 98.3 F (36.8 C), temperature source Oral, resp. rate 18, height 5' 5"  (1.651 m), weight 75.161 kg (165 lb 11.2 oz), SpO2 99 %. No results found.  Recent Labs  12/02/15 1049  WBC 8.7  HGB 13.8  HCT 41.0  PLT 300    Recent Labs  12/02/15 1049  NA 139  K 4.0  CL 101  GLUCOSE 137*  BUN 28*  CREATININE 2.00*  CALCIUM 10.1   CBG (last 3)   Recent Labs  12/03/15 1632 12/03/15 2146 12/04/15 0654  GLUCAP 181* 165* 134*    Wt Readings from Last 3 Encounters:  12/04/15 75.161 kg (165 lb 11.2 oz)  11/22/15 78 kg (171 lb 15.3 oz)  11/08/15 80.196 kg (176 lb 12.8 oz)    Physical Exam:  BP 114/55 mmHg  Pulse 61  Temp(Src) 98.3 F (36.8 C) (Oral)  Resp 18  Ht 5' 5"  (1.651 m)  Wt 75.161 kg (165 lb 11.2 oz)  BMI 27.57 kg/m2  SpO2 99% Constitutional: He appears well-developed. NAD. Vital signs reviewed. HENT: Normocephalic. Atraumatic Eyes: EOM and conjunctiva are normal.  Cardiovascular: Cardiac rate control. + Murmur  Respiratory: Effort normal and breath sounds normal. No respiratory distress.  GI: Soft. Bowel sounds are normal. He exhibits no distension.  Musculoskeletal: No edema.  Neurological: He is alert and oriented Motor: 5/5 RUE/RLE LUE/LLE: 4+-5/5 proximal to distal. Mild ataxia left upper extremity (Improved, stable).  Skin: Skin is warm and dry. Folliculitis right gluteal area 2 (both, small areas of opening with mild drainage, both improving) Psych: mood is pleasant and cooperative. Normal mood and affect.  Assessment/Plan: 1. Functional deficits secondary to right frontal temporal infarct which require 3+ hours per day of interdisciplinary therapy in a  comprehensive inpatient rehab setting. Physiatrist is providing close team supervision and 24 hour management of active medical problems listed below. Physiatrist and rehab team continue to assess barriers to discharge/monitor patient progress toward functional and medical goals.  Function:  Bathing Bathing position   Position: Shower  Bathing parts Body parts bathed by patient: Right arm, Left arm, Chest, Abdomen, Front perineal area, Buttocks, Right upper leg, Left upper leg, Right lower leg, Left lower leg, Back Body parts bathed by helper: Back  Bathing assist Assist Level: Supervision or verbal cues      Upper Body Dressing/Undressing Upper body dressing   What is the patient wearing?: Pull over shirt/dress     Pull over shirt/dress - Perfomed by patient: Thread/unthread right sleeve, Thread/unthread left sleeve, Put head through opening, Pull shirt over trunk          Upper body assist Assist Level: Set up   Set up : To obtain clothing/put away  Lower Body Dressing/Undressing Lower body dressing   What is the patient wearing?: Pants, Underwear, Ted Hose, Shoes, AFO Underwear - Performed by patient: Thread/unthread right underwear leg, Thread/unthread left underwear leg, Pull underwear up/down Underwear - Performed by helper: Pull underwear up/down Pants- Performed by patient: Thread/unthread left pants leg, Thread/unthread right pants leg, Pull pants up/down, Fasten/unfasten pants Pants- Performed by helper: Pull pants up/down     Socks - Performed by patient: Don/doff right sock, Don/doff left sock   Shoes - Performed by  patient: Don/doff right shoe, Fasten right, Fasten left, Don/doff left shoe (with AFO) Shoes - Performed by helper:  (with AFO) AFO - Performed by patient: Don/doff left AFO   TED Hose - Performed by patient: Don/doff right TED hose, Don/doff left TED hose TED Hose - Performed by helper: Don/doff right TED hose  Lower body assist Assist for lower  body dressing: Supervision or verbal cues   Set up : To obtain clothing/put away  Toileting Toileting Toileting activity did not occur: N/A Toileting steps completed by patient: Adjust clothing prior to toileting, Performs perineal hygiene, Adjust clothing after toileting Toileting steps completed by helper: Adjust clothing prior to toileting, Performs perineal hygiene, Adjust clothing after toileting (per Malachi Carl, NT report) Toileting Assistive Devices: Grab bar or rail  Toileting assist Assist level: No help/no cues   Transfers Chair/bed transfer   Chair/bed transfer method: Stand pivot Chair/bed transfer assist level: Supervision or verbal cues Chair/bed transfer assistive device: Medical sales representative     Max distance: >150 ft Assist level: Supervision or verbal cues   Wheelchair   Type: Manual Max wheelchair distance: 150 Assist Level: Touching or steadying assistance (Pt > 75%)  Cognition Comprehension Comprehension assist level: Follows complex conversation/direction with no assist  Expression Expression assist level: Expresses complex ideas: With no assist  Social Interaction Social Interaction assist level: Interacts appropriately with others - No medications needed.  Problem Solving Problem solving assist level: Solves basic 90% of the time/requires cueing < 10% of the time  Memory Memory assist level: Recognizes or recalls 90% of the time/requires cueing < 10% of the time    Medical Problem List and Plan: 1. Left hemiparesthesias with balance deficits mild cognitive impairment secondary to right frontoparietal infarct after carotid angiogram. Follow-up 4 weeks outpatient vascular surgery to consider carotid enterectomy  Continue CIR  Will see patient in 1-2 weeks for transitional care management. 2. DVT Prophylaxis/Anticoagulation: Subcutaneous Lovenox. Monitor platelet counts and any signs of bleeding 3. Pain Management: Oxycodone as  needed -kpad for left neck, ROM with therapy 4. Diabetes mellitus with peripheral neuropathy.   Glucophage 1000 mg twice a day, DC'd on 5/15 due to GFR.   Check blood sugars before meals and at bedtime. Diabetic teaching  Remains fairly well controlled at present, will need to follow up as outpt for ambulatory monitoring and adjustments by PCP 5. Neuropsych: This patient is capable of making decisions on his own behalf. 6. Skin/Wound Care: Routine skin checks 7. Fluids/Electrolytes/Nutrition: Routine I&O's  Mild hyponatremia: resolved. 139 on 5/15 8. Hypertension.   Norvasc 10 mg daily  Hydrochlorothiazide 25 mg daily, DC'd on 5/16  Lisinopril 40 mg daily  Toprol-XL 100 mg daily.     -controlled at present, will need to make further adjustments and possible wean as outpatient by PCP 9. Diastolic congestive heart failure. Monitor for any signs of fluid overload. Weigh patient daily 10.CRI. Baseline creatinine 1.40-1.48.   Creatinine 2.0 on 5/15, patient will likely need to follow-up as outpatient  Continue encourage by mouth fluid intake 11. Hyperlipidemia. Pravachol 12. Leukocytosis: Resolved  WBCs 8.7 on 5/15  Afebrile, no signs and symptoms of infection.  Will continue to monitor 13. Constipation  Abdominal x-ray reviewed from 5/9-normal   Resolved 14. Insomnia: Resolved  Patient does not wish to take any medications 15. Folliculitis 2  Right gluteal area, both slightly open with drainage, both cont to improve  Keflex initiated on 5/12, will continue until 5/26  Added  Santyl dressing to distal wound  If they do not continue to improve, may need I&D as outpatient   LOS (Days) 12 A FACE TO FACE EVALUATION WAS PERFORMED  Rianne Degraaf Lorie Phenix 12/04/2015 8:05 AM

## 2015-12-04 NOTE — Progress Notes (Signed)
Speech Language Pathology Discharge Summary  Patient Details  Name: Evan Stanley MRN: [id number] Date of Birth: 1941/02/20  Today's Date: 12/04/2015 SLP Individual Time: 1000-1030 SLP Individual Time Calculation (min): 30 min   Skilled Therapeutic Interventions:  Pt was seen for skilled ST targeting family education prior to discharge home. Pt's wife was present during today's therapy session and remained actively engaged in training.  SLP provided pt's wife with skilled education regarding pt's current goals and progress in therapies, including current cognitive limitations and their impact on pt's functional independence in the home environment.  Pt's wife was given a handout of memory compensatory strategies.  SLP also recommended that pt have someone with him every time he is up walking in his house given poor safety awareness and continued cues needed for safe walker use.  Pt's wife verbalized understanding.  SLP also recommended that pt have home health ST follow up at next level of care to continue to address cognitive deficits.  Pt left in wheelchair with wife at bedside.  All questions were answered to their satisfaction at this time.       Patient has met 3 of 3 long term goals.  Patient to discharge at overall Supervision level.  Reasons goals not met:     Clinical Impression/Discharge Summary:  Pt made excellent gains while inpatient and is discharging having met 3 out of 3 long term goals.  Pt is at an overall supervision level assist for semi-complex cognitive tasks due to mild cognitive deficits characterized by decreased emergent/anticipatory awareness of deficits and decreased recall of new information.  Pt is discharging home with family.  Team is recommending 24/7 supervision.  Pt and family education is complete for this venue of care but pt would continue to benefit from [place] services at next level of care for treatment and ongoing family education regarding cognitive function.     Care Partner:  Caregiver Able to Provide Assistance: Yes  Type of Caregiver Assistance: Physical;Cognitive  Recommendation:  Home Health SLP;24 hour supervision/assistance  Rationale for SLP Follow Up: Maximize cognitive function and independence;Reduce caregiver burden   Equipment: none recommended by SLP    Reasons for discharge: Discharged from hospital   Patient/Family Agrees with Progress Made and Goals Achieved: Yes   Function:  Eating Eating                 Cognition Comprehension Comprehension assist level: Follows complex conversation/direction with extra time/assistive device  Expression   Expression assist level: Expresses complex ideas: With extra time/assistive device  Social Interaction Social Interaction assist level: Interacts appropriately with others with medication or extra time (anti-anxiety, antidepressant).  Problem Solving Problem solving assist level: Solves basic 90% of the time/requires cueing < 10% of the time  Memory Memory assist level: Recognizes or recalls 90% of the time/requires cueing < 10% of the time   Emilio Math 12/04/2015, 4:25 PM

## 2015-12-05 ENCOUNTER — Telehealth: Payer: Self-pay

## 2015-12-05 NOTE — Telephone Encounter (Signed)
Left message for patient to call back to make Transitional Care Call.   Appointment Scheduled for Thursday May 25th, 2017 at 9:45am and Packet has been mailed.

## 2015-12-06 NOTE — Telephone Encounter (Signed)
Pt's wife refused appointment at this time. Would like to wait to follow up at a later time due to other appointments that have been made. They both feel overwhelmed. Pt's wife states she will give [place] a call when she feels it's necessary.

## 2015-12-12 ENCOUNTER — Encounter: Payer: BLUE CROSS/BLUE SHIELD | Admitting: Physical Medicine & Rehabilitation

## 2015-12-17 ENCOUNTER — Encounter: Payer: Self-pay | Admitting: Vascular Surgery

## 2015-12-18 NOTE — Progress Notes (Addendum)
Established Carotid Patient  History of Present Illness  Evan Stanley is a 75 y.o. (Jul 19, 1941) male who presents with chief complaint: resolved weakness.  This patient underwent a technically effortless R carotid angiogram (11/18/15) which resulted in embolization suggesting friable plaque or thrombus in the aortic arch or carotid.  This patient's prior sx included: L eye visual field loss, <1 hour episode of slurring of speech.  His most recent sx were: discoordination of LLE.  The patient's previous neurologic deficits have resolved.  Previous carotid studies demonstrated: RICA >89% stenosis, LICA occluded.  The patient has never had amaurosis fugax but has developed partial visual field loss. The patient has never had facial drooping or hemiplegia. The patient has had expressive aphasia. The patient's previous neurologic deficits have resolved. The patient's risks factors for carotid disease include: DM, HLD, HTN, afib.   The patient's PMH, PSH, SH, and FamHx are unchanged from 11/22/15.  Current Outpatient Prescriptions  Medication Sig Dispense Refill  . amLODipine (NORVASC) 10 MG tablet Take 1 tablet (10 mg total) by mouth daily. 90 tablet 1  . aspirin EC 81 MG tablet Take 1 tablet (81 mg total) by mouth daily. 90 tablet 3  . cephALEXin (KEFLEX) 500 MG capsule Take 1 capsule (500 mg total) by mouth every 12 (twelve) hours. 18 capsule 0  . lisinopril (PRINIVIL,ZESTRIL) 40 MG tablet Take 1 tablet (40 mg total) by mouth daily. 90 tablet 1  . metoprolol succinate (TOPROL-XL) 100 MG 24 hr tablet Take 1 tablet (100 mg total) by mouth daily. Take with or immediately following a meal. 90 tablet 1  . oxyCODONE-acetaminophen (PERCOCET/ROXICET) 5-325 MG tablet Take 1-2 tablets by mouth every 4 (four) hours as needed for moderate pain. 60 tablet 0  . pantoprazole (PROTONIX) 40 MG tablet Take 1 tablet (40 mg total) by mouth daily. 30 tablet 0  . pravastatin (PRAVACHOL) 40 MG tablet Take 1 tablet  (40 mg total) by mouth daily. 30 tablet 1   No current facility-administered medications for this visit.    No Known Allergies  On ROS today: resolved L side ataxia, no other neurologic sx   Physical Examination  Filed Vitals:   12/20/15 1138 12/20/15 1140  BP: 128/68 130/67  Pulse: 60   Height: 5' 5"  (1.651 m)   Weight: 171 lb 9.6 oz (77.837 kg)   SpO2: 98%    Body mass index is 28.56 kg/(m^2).  General: A&O x 3, WDWN  Eyes: PERRLA, EOMI  Neck: Supple, no nuchal rigidity, no palpable LAD  Pulmonary: Sym exp, good air movt, CTAB, no rales, rhonchi, & wheezing  Cardiac: RRR, Nl S1, S2, no Murmurs, rubs or gallops  Vascular: Vessel Right Left  Radial Palpable Palpable  Brachial Palpable Palpable  Carotid Palpable, without bruit Palpable, without bruit  Aorta Not palpable N/A  Femoral Palpable Palpable  Popliteal Not palpable Not palpable  PT NotPalpable NotPalpable  DP Not Palpable Palpable   Gastrointestinal: soft, NTND, no G/R, no HSM, no masses, no CVAT B  Musculoskeletal: M/S 5/5 throughout , Extremities without ischemic changes   Neurologic: CN 2-12 intact , Pain and light touch intact in extremities , Motor exam as listed above   Medical Decision Making  [name] is a 75 y.o. male who presents with: sx R ICA stenosis >80%, occluded L ICA, s/p R carotid angiogram complicated by CVA   This patient's carotid angiogram was near effortless with minimal catheter and wire manipulation.  The fact that he embolized basically  proves he has intraluminal disease that is friable, subsequently, I do NOT recommend carotid artery stenting  With contralateral occlusion, he may be at risk for death or devastating CVA if his R ICA occludes.  I recommended: R CEA.    The angiogram suggests his disease should be surgically accessible, despite the concerns raised on CTA Neck.  I discussed with the patient the risks, benefits, and alternatives to carotid  endarterectomy.    The patient is aware that the risks of carotid endarterectomy include but are not limited to: bleeding, infection, stroke, myocardial infarction, death, cranial nerve injuries both temporary and permanent, neck hematoma, possible airway compromise, labile blood pressure post-operatively, cerebral hyperperfusion syndrome, and possible need for additional interventions in the future.   The patient is going consider proceeding.  Will have him come back in 2 weeks to make a final decision.  Additionally, will need clearance from his Cardiologist prior to proceeding.  Reported he was seen in January this year and was told he was ok.  I discussed in depth with the patient the nature of atherosclerosis, and emphasized the importance of maximal medical management including strict control of blood pressure, blood glucose, and lipid levels, antiplatelet agents, obtaining regular exercise, and cessation of smoking.    The patient is aware that without maximal medical management the underlying atherosclerotic disease process will progress, limiting the benefit of any interventions. The patient is currently on a statin: Pravachol The patient is currently on an anti-platelet: ASA.  Thank you for allowing [place] to participate in this patient's care.   [name], MD [facility] Office: [phone] Pager: [phone]  12/20/2015, 12:56 PM

## 2015-12-20 ENCOUNTER — Encounter: Payer: Self-pay | Admitting: Vascular Surgery

## 2015-12-20 ENCOUNTER — Ambulatory Visit (INDEPENDENT_AMBULATORY_CARE_PROVIDER_SITE_OTHER): Payer: BLUE CROSS/BLUE SHIELD | Admitting: Vascular Surgery

## 2015-12-20 VITALS — BP 130/67 | HR 60 | Ht 65.0 in | Wt 171.6 lb

## 2015-12-20 DIAGNOSIS — I739 Peripheral vascular disease, unspecified: Principal | ICD-10-CM

## 2015-12-20 DIAGNOSIS — I639 Cerebral infarction, unspecified: Secondary | ICD-10-CM

## 2015-12-20 DIAGNOSIS — I779 Disorder of arteries and arterioles, unspecified: Secondary | ICD-10-CM

## 2015-12-27 ENCOUNTER — Encounter: Payer: Self-pay | Admitting: Family Medicine

## 2015-12-27 ENCOUNTER — Ambulatory Visit (INDEPENDENT_AMBULATORY_CARE_PROVIDER_SITE_OTHER): Payer: BLUE CROSS/BLUE SHIELD | Admitting: Family Medicine

## 2015-12-27 VITALS — BP 121/66 | HR 60 | Temp 97.2°F | Ht 65.0 in | Wt 174.0 lb

## 2015-12-27 DIAGNOSIS — E109 Type 1 diabetes mellitus without complications: Secondary | ICD-10-CM | POA: Diagnosis not present

## 2015-12-27 DIAGNOSIS — I10 Essential (primary) hypertension: Secondary | ICD-10-CM

## 2015-12-27 DIAGNOSIS — E785 Hyperlipidemia, unspecified: Secondary | ICD-10-CM

## 2015-12-27 DIAGNOSIS — I639 Cerebral infarction, unspecified: Secondary | ICD-10-CM

## 2015-12-27 LAB — BAYER DCA HB A1C WAIVED: HB A1C: 7.6 % — AB (ref <7.0)

## 2015-12-27 MED ORDER — ATORVASTATIN CALCIUM 40 MG PO TABS: 40.0000 mg | ORAL_TABLET | Freq: Every day | ORAL | Status: DC

## 2015-12-27 MED ORDER — AMLODIPINE BESYLATE 10 MG PO TABS
10.0000 mg | ORAL_TABLET | Freq: Every day | ORAL | Status: DC
Start: 2015-12-27 — End: 2017-01-17

## 2015-12-27 MED ORDER — LISINOPRIL 40 MG PO TABS: 40.0000 mg | ORAL_TABLET | Freq: Every day | ORAL | Status: DC

## 2015-12-27 MED ORDER — METOPROLOL SUCCINATE ER 100 MG PO TB24: 100.0000 mg | ORAL_TABLET | Freq: Every day | ORAL | Status: DC

## 2015-12-27 MED ORDER — METFORMIN HCL 1000 MG PO TABS: 1000.0000 mg | ORAL_TABLET | Freq: Two times a day (BID) | ORAL | Status: DC

## 2015-12-27 MED ORDER — PRAVASTATIN SODIUM 40 MG PO TABS: 40.0000 mg | ORAL_TABLET | Freq: Every day | ORAL | Status: DC

## 2015-12-27 MED ORDER — HYDROCHLOROTHIAZIDE 25 MG PO TABS: 25.0000 mg | ORAL_TABLET | Freq: Every day | ORAL | Status: DC

## 2015-12-27 NOTE — Patient Instructions (Signed)
Medicare Annual Wellness Visit  [place] and the medical providers at [facility] strive to bring you the best medical care.  In doing so we not only want to address your current medical conditions and concerns but also to detect new conditions early and prevent illness, disease and health-related problems.    Medicare offers a yearly Wellness Visit which allows our clinical staff to assess your need for preventative services including immunizations, lifestyle education, counseling to decrease risk of preventable diseases and screening for fall risk and other medical concerns.    This visit is provided free of charge (no copay) for all Medicare recipients. The clinical pharmacists at [facility] have begun to conduct these Wellness Visits which will also include a thorough review of all your medications.    As you primary medical provider recommend that you make an appointment for your Annual Wellness Visit if you have not done so already this year.  You may set up this appointment before you leave today or you may call back ([phone]) and schedule an appointment.  Please make sure when you call that you mention that you are scheduling your Annual Wellness Visit with the clinical pharmacist so that the appointment may be made for the proper length of time.     Continue current medications. Continue good therapeutic lifestyle changes which include good diet and exercise. Fall precautions discussed with patient. If an FOBT was given today- please return it to our front desk. If you are over 50 years old - you may need Prevnar 13 or the adult Pneumonia vaccine.  **Flu shots are available--- please call and schedule a FLU-CLINIC appointment**  After your visit with us today you will receive a survey in the mail or online from [employer] regarding your care with us. Please take a moment to fill this out. Your feedback is very  important to us as you can help us better understand your patient needs as well as improve your experience and satisfaction. WE CARE ABOUT YOU!!!    

## 2015-12-27 NOTE — Addendum Note (Signed)
Addended by: Zannie Cove on: 12/27/2015 08:43 AM   Modules accepted: Orders

## 2015-12-27 NOTE — Progress Notes (Signed)
Subjective:    Patient ID: Evan Stanley, male    DOB: [birth year], 75 y.o.   MRN: [id number]  HPI Pt here for follow up and management of chronic medical problems which includes diabetes and hypertension. He is taking medications regularly. Patient has a history of hypertension type 2 diabetes hyperlipidemia. He also has known carotid artery disease and had a arteriogram with subsequent CVA. Symptoms of that CVA were left lower extremity weakness. He was hospitalized for 17 days during which time he was in acute inpatient rehabilitation. Fortunately now he has no residual although he still doing some exercises. There are some balance issues.   Patient Active Problem List   Diagnosis Date Noted  . Benign essential HTN   . Cerebral infarction due to vascular occlusion ([place])   . Folliculitis   . Slow transit constipation   . Hyponatremia   . Chronic renal insufficiency   . AKI (acute kidney injury) ([place])   . Leukocytosis   . Type 2 diabetes mellitus with peripheral neuropathy (HCC)   . CVA (cerebral infarction) 11/22/2015  . Acute CVA (cerebrovascular accident) ([place]) 11/19/2015  . Carotid artery disease ([place]) 11/18/2015  . Carotid artery occlusion without infarction 10/26/2014  . Carotid stenosis 10/26/2014  . Status post aortic valve replacement with bioprosthetic valve 07/05/2013  . Hyperlipidemia 07/05/2013  . Essential hypertension 07/05/2013  . CAD (coronary artery disease)   . DM (diabetes mellitus) [facility])    Outpatient Encounter Prescriptions as of 12/27/2015  Medication Sig  . amLODipine (NORVASC) 10 MG tablet Take 1 tablet (10 mg total) by mouth daily.  Marland Kitchen aspirin EC 81 MG tablet Take 1 tablet (81 mg total) by mouth daily.  . hydrochlorothiazide (HYDRODIURIL) 25 MG tablet Take 1 tablet (25 mg total) by mouth daily.  Marland Kitchen lisinopril (PRINIVIL,ZESTRIL) 40 MG tablet Take 1 tablet (40 mg total) by mouth daily.  . metFORMIN (GLUCOPHAGE) 1000 MG tablet Take 1 tablet (1,000 mg total) by  mouth 2 (two) times daily with a meal.  . metoprolol succinate (TOPROL-XL) 100 MG 24 hr tablet Take 1 tablet (100 mg total) by mouth daily. Take with or immediately following a meal.  . pravastatin (PRAVACHOL) 40 MG tablet Take 1 tablet (40 mg total) by mouth daily.  . [DISCONTINUED] amLODipine (NORVASC) 10 MG tablet Take 1 tablet (10 mg total) by mouth daily.  . [DISCONTINUED] hydrochlorothiazide (HYDRODIURIL) 25 MG tablet Take 25 mg by mouth daily.  . [DISCONTINUED] lisinopril (PRINIVIL,ZESTRIL) 40 MG tablet Take 1 tablet (40 mg total) by mouth daily.  . [DISCONTINUED] metFORMIN (GLUCOPHAGE) 1000 MG tablet Take 1,000 mg by mouth 2 (two) times daily with a meal.  . [DISCONTINUED] metoprolol succinate (TOPROL-XL) 100 MG 24 hr tablet Take 1 tablet (100 mg total) by mouth daily. Take with or immediately following a meal.  . [DISCONTINUED] pravastatin (PRAVACHOL) 40 MG tablet Take 1 tablet (40 mg total) by mouth daily.  . [DISCONTINUED] cephALEXin (KEFLEX) 500 MG capsule Take 1 capsule (500 mg total) by mouth every 12 (twelve) hours.  . [DISCONTINUED] oxyCODONE-acetaminophen (PERCOCET/ROXICET) 5-325 MG tablet Take 1-2 tablets by mouth every 4 (four) hours as needed for moderate pain.  . [DISCONTINUED] pantoprazole (PROTONIX) 40 MG tablet Take 1 tablet (40 mg total) by mouth daily.   No facility-administered encounter medications on file as of 12/27/2015.      Review of Systems  Constitutional: Negative.   HENT: Negative.   Eyes: Negative.   Respiratory: Negative.   Cardiovascular: Negative.   Gastrointestinal: Negative.  Endocrine: Negative.   Genitourinary: Negative.   Musculoskeletal: Negative.   Skin: Negative.   Allergic/Immunologic: Negative.   Neurological: Negative.   Hematological: Negative.   Psychiatric/Behavioral: Negative.        Objective:   Physical Exam  Constitutional: He is oriented to person, place, and time. He appears well-developed and well-nourished.  Neck:    Bruit heard on right carotid  Cardiovascular: Normal rate, regular rhythm and normal heart sounds.   Musculoskeletal: Normal range of motion.  Neurological: He is alert and oriented to person, place, and time. He displays normal reflexes. He exhibits normal muscle tone. Coordination normal.  Psychiatric: He has a normal mood and affect.    BP 121/66 mmHg  Pulse 60  Temp(Src) 97.2 F (36.2 C) (Oral)  Ht 5' 5"  (1.651 m)  Wt 174 lb (78.926 kg)  BMI 28.96 kg/m2       Assessment & Plan:  1. Essential hypertension Pressure is well controlled with amlodipine and lisinopril - amLODipine (NORVASC) 10 MG tablet; Take 1 tablet (10 mg total) by mouth daily.  Dispense: 90 tablet; Refill: 3  2. Type 1 diabetes mellitus without complication (HCC) About the same or better Last A1c was 7.3. Testing at home over the last 3 months and in the hospital suggests A1c will be - [employer]ayer DCA Hb A1c Waived  3. Hyperlipidemia LDL is not at goal on pravastatin 40. Will change to more potent statin, a atorvastatin 40 mg and recheck in 2 months  Wardell Honour MD

## 2015-12-30 ENCOUNTER — Encounter: Payer: Self-pay | Admitting: Vascular Surgery

## 2016-01-03 ENCOUNTER — Encounter: Payer: Self-pay | Admitting: Vascular Surgery

## 2016-01-03 ENCOUNTER — Ambulatory Visit (INDEPENDENT_AMBULATORY_CARE_PROVIDER_SITE_OTHER): Payer: BLUE CROSS/BLUE SHIELD | Admitting: Vascular Surgery

## 2016-01-03 ENCOUNTER — Telehealth: Payer: Self-pay | Admitting: Interventional Cardiology

## 2016-01-03 ENCOUNTER — Other Ambulatory Visit: Payer: Self-pay

## 2016-01-03 VITALS — BP 132/70 | HR 57 | Ht 65.0 in | Wt 174.0 lb

## 2016-01-03 DIAGNOSIS — Z01810 Encounter for preprocedural cardiovascular examination: Secondary | ICD-10-CM

## 2016-01-03 DIAGNOSIS — I639 Cerebral infarction, unspecified: Secondary | ICD-10-CM | POA: Diagnosis not present

## 2016-01-03 DIAGNOSIS — I739 Peripheral vascular disease, unspecified: Principal | ICD-10-CM

## 2016-01-03 DIAGNOSIS — I2510 Atherosclerotic heart disease of native coronary artery without angina pectoris: Secondary | ICD-10-CM

## 2016-01-03 DIAGNOSIS — I779 Disorder of arteries and arterioles, unspecified: Secondary | ICD-10-CM

## 2016-01-03 NOTE — Telephone Encounter (Signed)
New message      Request for surgical clearance:  1. What type of surgery is being performed? Rt CEA  2. When is this surgery scheduled? June 26th  3. Are there any medications that need to be held prior to surgery and how long? Cardiac clearence  4. Name of physician performing surgery? Dr. Bridgett Larsson  5. What is your office phone and fax number? Fax number [phone] number [phone]

## 2016-01-03 NOTE — Telephone Encounter (Signed)
Message sent to [place].

## 2016-01-03 NOTE — Progress Notes (Signed)
Established Carotid Patient  History of Present Illness  Evan Stanley is a 75 y.o. (1941/05/06) male  who presents with chief complaint: resolved weakness. This patient underwent a technically effortless R carotid angiogram (11/18/15) which resulted in embolization suggesting friable plaque or thrombus in the aortic arch or carotid. This patient's prior sx included: L eye visual field loss, <1 hour episode of slurring of speech. His most recent sx were: discoordination of LLE. The patient's previous neurologic deficits have resolved.  Previous carotid studies demonstrated: RICA >39% stenosis, LICA occluded. The patient has never had amaurosis fugax but has developed partial visual field loss. The patient has never had facial drooping or hemiplegia. The patient has had expressive aphasia. The patient's previous neurologic deficits remain resolved. The patient's risks factors for carotid disease include: DM, HLD, HTN, afib.  The patient returns today to discuss proceeding with R CEA.  Past Medical History  Diagnosis Date  . Diabetes mellitus without complication ([place])   . Hyperlipidemia   . Hypertension   . CHF (congestive heart failure) ([place])   . Anxiety   . Atrial fibrillation ([place])   . Carotid artery stenosis   . TIA (transient ischemic attack)   . Decreased vision     left eye  . Stroke [facility])     Past Surgical History  Procedure Laterality Date  . Cardiac valve replacement    . Aortic valve replacement    . Back surgery    . Laser surgery, left eye  Left 10-29-2015     retinal surgery/ Deloria Lair MD   . Peripheral vascular catheterization Right 11/18/2015    Procedure: Carotid Angiography;  Surgeon: Conrad Riddle[place], MD;  Location: [place] [facility];  Service: Cardiovascular;  Laterality: Right;  . Peripheral vascular catheterization N/A 11/18/2015    Procedure: Aortic Arch Angiography;  Surgeon: Conrad [place], MD;  Location: [place] [facility];  Service: Cardiovascular;   Laterality: N/A;    Social History   Social History  . Marital Status: Married    Spouse Name: N/A  . Number of Children: N/A  . Years of Education: N/A   Occupational History  . Not on file.   Social History Main Topics  . Smoking status: Former Smoker    Quit date: 07/20/1984  . Smokeless tobacco: Never Used  . Alcohol Use: No  . Drug Use: No  . Sexual Activity: Not on file   Other Topics Concern  . Not on file   Social History Narrative    Family History  Problem Relation Age of Onset  . Heart disease Mother   . Stroke Father 48    Current Outpatient Prescriptions  Medication Sig Dispense Refill  . amLODipine (NORVASC) 10 MG tablet Take 1 tablet (10 mg total) by mouth daily. 90 tablet 3  . aspirin EC 81 MG tablet Take 1 tablet (81 mg total) by mouth daily. 90 tablet 3  . atorvastatin (LIPITOR) 40 MG tablet Take 1 tablet (40 mg total) by mouth daily. 90 tablet 3  . hydrochlorothiazide (HYDRODIURIL) 25 MG tablet Take 1 tablet (25 mg total) by mouth daily. 90 tablet 3  . lisinopril (PRINIVIL,ZESTRIL) 40 MG tablet Take 1 tablet (40 mg total) by mouth daily. 90 tablet 3  . metFORMIN (GLUCOPHAGE) 1000 MG tablet Take 1 tablet (1,000 mg total) by mouth 2 (two) times daily with a meal. 180 tablet 3  . metoprolol succinate (TOPROL-XL) 100 MG 24 hr tablet Take 1 tablet (100 mg  total) by mouth daily. Take with or immediately following a meal. 90 tablet 3   No current facility-administered medications for this visit.     No Known Allergies   REVIEW OF SYSTEMS:  (Positives checked otherwise negative)  CARDIOVASCULAR:   [ ]  chest pain,  [ ]  chest pressure,  [ ]  palpitations,  [ ]  shortness of breath when laying flat,  [ ]  shortness of breath with exertion,   [ ]  pain in feet when walking,  [ ]  pain in feet when laying flat, [ ]  history of blood clot in veins (DVT),  [ ]  history of phlebitis,  [ ]  swelling in legs,  [ ]  varicose veins  PULMONARY:   [ ]  productive  cough,  [ ]  asthma,  [ ]  wheezing  NEUROLOGIC:   [x]  weakness in arms or legs,  [ ]  numbness in arms or legs,  [x]  difficulty speaking or slurred speech,  [x]  temporary loss of vision in one eye,  [ ]  dizziness  HEMATOLOGIC:   [ ]  bleeding problems,  [ ]  problems with blood clotting too easily  MUSCULOSKEL:   [ ]  joint pain, [ ]  joint swelling  GASTROINTEST:   [ ]  vomiting blood,  [ ]  blood in stool     GENITOURINARY:   [ ]  burning with urination,  [ ]  blood in urine  PSYCHIATRIC:   [ ]  history of major depression  INTEGUMENTARY:   [ ]  rashes,  [ ]  ulcers  CONSTITUTIONAL:   [ ]  fever,  [ ]  chills      Physical Examination  Filed Vitals:   01/03/16 1110 01/03/16 1128  BP: 147/77 132/70  Pulse: 57   Height: 5' 5"  (1.651 m)   Weight: 174 lb (78.926 kg)   SpO2: 98%    Body mass index is 28.96 kg/(m^2).   General: A&O x 3, WDWN  Eyes: PERRLA, EOMI  Neck: Supple, no nuchal rigidity, no palpable LAD  Pulmonary: Sym exp, good air movt, CTAB, no rales, rhonchi, & wheezing  Cardiac: RRR, Nl S1, S2, no Murmurs, rubs or gallops  Vascular: Vessel Right Left  Radial Palpable Palpable  Brachial Palpable Palpable  Carotid Palpable, without bruit Palpable, without bruit  Aorta Not palpable N/A  Femoral Palpable Palpable  Popliteal Not palpable Not palpable  PT NotPalpable NotPalpable  DP Not Palpable Palpable   Gastrointestinal: soft, NTND, no G/R, no HSM, no masses, no CVAT B  Musculoskeletal: M/S 5/5 throughout , Extremities without ischemic changes   Neurologic: CN 2-12 intact , Pain and light touch intact in extremities , Motor exam as listed above   Medical Decision Making  Evan Stanley is a 75 y.o. (Mar 16, 1941) male who presents with: sx R ICA stenosis >80%, occluded L ICA, s/p R carotid angiogram complicated by CVA   The patient has decided to proceed with R CEA.  He is scheduled for 26 JUN 17.  I discussed with  the patient the risks, benefits, and alternatives to carotid endarterectomy.   The patient is aware that the risks of carotid endarterectomy include but are not limited to: bleeding, infection, stroke, myocardial infarction, death, cranial nerve injuries both temporary and permanent, neck hematoma, possible airway compromise, labile blood pressure post-operatively, cerebral hyperperfusion syndrome, and possible need for additional interventions in the future.   Additionally, will need clearance from his Cardiologist prior to proceeding. Reported he was seen in January this year and was told he was ok.  I discussed in depth with the  patient the nature of atherosclerosis, and emphasized the importance of maximal medical management including strict control of blood pressure, blood glucose, and lipid levels, antiplatelet agents, obtaining regular exercise, and cessation of smoking.   The patient is aware that without maximal medical management the underlying atherosclerotic disease process will progress, limiting the benefit of any interventions.  The patient is currently on a statin: Pravachol  The patient is currently on an anti-platelet: ASA.  Thank you for allowing [place] to participate in this patient's care.   [name], MD [facility] Office: [phone] Pager: [phone]

## 2016-01-05 ENCOUNTER — Encounter: Payer: Self-pay | Admitting: Interventional Cardiology

## 2016-01-05 DIAGNOSIS — Z953 Presence of xenogenic heart valve: Secondary | ICD-10-CM

## 2016-01-05 DIAGNOSIS — Z952 Presence of prosthetic heart valve: Secondary | ICD-10-CM | POA: Insufficient documentation

## 2016-01-05 NOTE — Telephone Encounter (Signed)
He had moderate 3 vessel CAD in 2010 and needs a lexiscan myoview for clearance.

## 2016-01-06 NOTE — Telephone Encounter (Signed)
Left message at pt's home # to call back to review orders and instructions.

## 2016-01-06 NOTE — Telephone Encounter (Signed)
Follow-up    The office called this am and noticed the Md put in a note for the pt to have a Lexiscan Myoview needs to have a order put in.

## 2016-01-06 NOTE — Telephone Encounter (Signed)
Order placed for Evan Stanley.  Pt will be called to schedule an appt for testing.

## 2016-01-08 ENCOUNTER — Telehealth (HOSPITAL_COMMUNITY): Payer: Self-pay | Admitting: *Deleted

## 2016-01-08 NOTE — Telephone Encounter (Signed)
Patient has been scheduled for lexiscan myoview and has been given instructions by Ivin Booty in Nuclear medicine.

## 2016-01-08 NOTE — Telephone Encounter (Signed)
Patient given detailed instructions per Myocardial Perfusion Study Information Sheet for the test on 01/09/16 at 7:30. Patient notified to arrive 15 minutes early and that it is imperative to arrive on time for appointment to keep from having the test rescheduled.  If you need to cancel or reschedule your appointment, please call the office within 24 hours of your appointment. Failure to do so may result in a cancellation of your appointment, and a $50 no show fee. Patient verbalized understanding.Evan Stanley

## 2016-01-09 ENCOUNTER — Ambulatory Visit (HOSPITAL_COMMUNITY): Payer: BLUE CROSS/BLUE SHIELD | Attending: Cardiology

## 2016-01-09 DIAGNOSIS — Z01810 Encounter for preprocedural cardiovascular examination: Secondary | ICD-10-CM

## 2016-01-09 DIAGNOSIS — I779 Disorder of arteries and arterioles, unspecified: Secondary | ICD-10-CM | POA: Insufficient documentation

## 2016-01-09 DIAGNOSIS — I2510 Atherosclerotic heart disease of native coronary artery without angina pectoris: Secondary | ICD-10-CM | POA: Diagnosis not present

## 2016-01-09 DIAGNOSIS — R9439 Abnormal result of other cardiovascular function study: Secondary | ICD-10-CM | POA: Insufficient documentation

## 2016-01-09 DIAGNOSIS — I10 Essential (primary) hypertension: Secondary | ICD-10-CM | POA: Insufficient documentation

## 2016-01-09 LAB — MYOCARDIAL PERFUSION IMAGING
CHL CUP NUCLEAR SDS: 4
CHL CUP NUCLEAR SRS: 3
CHL CUP NUCLEAR SSS: 7
CHL CUP RESTING HR STRESS: 63 {beats}/min
CSEPPHR: 87 {beats}/min
LV dias vol: 91 mL (ref 62–150)
LV sys vol: 34 mL
NUC STRESS TID: 1.07
RATE: 0.35

## 2016-01-09 MED ORDER — TECHNETIUM TC 99M TETROFOSMIN IV KIT
10.1000 | PACK | Freq: Once | INTRAVENOUS | Status: AC | PRN
  Administered 2016-01-09: 10.1 via INTRAVENOUS
  Filled 2016-01-09: qty 10

## 2016-01-09 MED ORDER — REGADENOSON 0.4 MG/5ML IV SOLN
0.4000 mg | Freq: Once | INTRAVENOUS | Status: AC
  Administered 2016-01-09: 0.4 mg via INTRAVENOUS

## 2016-01-09 MED ORDER — TECHNETIUM TC 99M TETROFOSMIN IV KIT
32.7000 | PACK | Freq: Once | INTRAVENOUS | Status: AC | PRN
  Administered 2016-01-09: 32.7 via INTRAVENOUS
  Filled 2016-01-09: qty 33

## 2016-01-09 NOTE — Pre-Procedure Instructions (Addendum)
Evan Stanley  01/09/2016     Your procedure is scheduled on : Monday January 13, 2016 at 7:30 AM.  Report to [facility] Admitting at 5:30 AM.  Call this number if you have problems the morning of surgery: [phone]    Remember:  Do not eat food or drink liquids after midnight.  Take these medicines the morning of surgery with A SIP OF WATER : Amlodipine (Norvasc), Metoprolol (Toprol XL), and Aspirin   Do NOT take any diabetic pills the morning of your surgery (NO Metformin/Glucophage)    Stop taking any vitamins, herbal medications/supplements, NSAIDs, Ibuprofen, Advil, Motrin, Aleve, etc today    How to Manage Your Diabetes Before and After Surgery  Why is it important to control my blood sugar before and after surgery? . Improving blood sugar levels before and after surgery helps healing and can limit problems. . A way of improving blood sugar control is eating a healthy diet by: o  Eating less sugar and carbohydrates o  Increasing activity/exercise o  Talking with your doctor about reaching your blood sugar goals . High blood sugars (greater than 180 mg/dL) can raise your risk of infections and slow your recovery, so you will need to focus on controlling your diabetes during the weeks before surgery. . Make sure that the doctor who takes care of your diabetes knows about your planned surgery including the date and location.  How do I manage my blood sugar before surgery? . Check your blood sugar at least 4 times a day, starting 2 days before surgery, to make sure that the level is not too high or low. o Check your blood sugar the morning of your surgery when you wake up and every 2 hours until you get to the Short Stay unit. . If your blood sugar is less than 70 mg/dL, you will need to treat for low blood sugar: o Do not take insulin. o Treat a low blood sugar (less than 70 mg/dL) with  cup of clear juice (cranberry or apple), 4 glucose tablets, OR glucose  gel. o Recheck blood sugar in 15 minutes after treatment (to make sure it is greater than 70 mg/dL). If your blood sugar is not greater than 70 mg/dL on recheck, call [phone] for further instructions. . Report your blood sugar to the short stay nurse when you get to Short Stay.  . If you are admitted to the hospital after surgery: o Your blood sugar will be checked by the staff and you will probably be given insulin after surgery (instead of oral diabetes medicines) to make sure you have good blood sugar levels. o The goal for blood sugar control after surgery is 80-180 mg/dL.      WHAT DO I DO ABOUT MY DIABETES MEDICATION?   Do not wear jewelry.  Do not wear lotions, powders, or cologne.    Men may shave face.  Do not bring valuables to the hospital.  [facility] is not responsible for any belongings or valuables.  Contacts, dentures or bridgework may not be worn into surgery.  Leave your suitcase in the car.  After surgery it may be brought to your room.  For patients admitted to the hospital, discharge time will be determined by your treatment team.  Patients discharged the day of surgery will not be allowed to drive home.   Name and phone number of your driver:    Special instructions:  Shower using CHG soap the night before and the  morning of your surgery  Please read over the following fact sheets that you were given. Pain Booklet and MRSA Information

## 2016-01-10 ENCOUNTER — Encounter (HOSPITAL_COMMUNITY)
Admission: RE | Admit: 2016-01-10 | Discharge: 2016-01-10 | Disposition: A | Discharge status: Home / Self Care | Payer: BLUE CROSS/BLUE SHIELD | Source: Ambulatory Visit | Attending: Vascular Surgery | Admitting: Vascular Surgery

## 2016-01-10 ENCOUNTER — Encounter (HOSPITAL_COMMUNITY): Payer: Self-pay

## 2016-01-10 HISTORY — DX: Urgency of urination: R39.15

## 2016-01-10 HISTORY — DX: Unspecified osteoarthritis, unspecified site: M19.90

## 2016-01-10 LAB — COMPREHENSIVE METABOLIC PANEL
ALT: 29 U/L (ref 17–63)
ANION GAP: 8 (ref 5–15)
AST: 25 U/L (ref 15–41)
Albumin: 4.2 g/dL (ref 3.5–5.0)
Alkaline Phosphatase: 56 U/L (ref 38–126)
BUN: 16 mg/dL (ref 6–20)
CHLORIDE: 102 mmol/L (ref 101–111)
CO2: 29 mmol/L (ref 22–32)
CREATININE: 1.57 mg/dL — AB (ref 0.61–1.24)
Calcium: 9.9 mg/dL (ref 8.9–10.3)
GFR calc non Af Amer: 42 mL/min — ABNORMAL LOW (ref >60)
GFR, EST AFRICAN AMERICAN: 49 mL/min — AB (ref >60)
Glucose, Bld: 243 mg/dL — ABNORMAL HIGH (ref 65–99)
POTASSIUM: 4.7 mmol/L (ref 3.5–5.1)
SODIUM: 139 mmol/L (ref 135–145)
Total Bilirubin: 0.7 mg/dL (ref 0.3–1.2)
Total Protein: 7 g/dL (ref 6.5–8.1)

## 2016-01-10 LAB — CBC
HCT: 41.4 % (ref 39.0–52.0)
Hemoglobin: 13.9 g/dL (ref 13.0–17.0)
MCH: 28 pg (ref 26.0–34.0)
MCHC: 33.6 g/dL (ref 30.0–36.0)
MCV: 83.3 fL (ref 78.0–100.0)
PLATELETS: 211 10*3/uL (ref 150–400)
RBC: 4.97 MIL/uL (ref 4.22–5.81)
RDW: 13.8 % (ref 11.5–15.5)
WBC: 10.3 10*3/uL (ref 4.0–10.5)

## 2016-01-10 LAB — URINALYSIS, ROUTINE W REFLEX MICROSCOPIC
Bilirubin Urine: NEGATIVE
Glucose, UA: 100 mg/dL — AB
Hgb urine dipstick: NEGATIVE
KETONES UR: NEGATIVE mg/dL
LEUKOCYTES UA: NEGATIVE
NITRITE: NEGATIVE
PH: 6.5 (ref 5.0–8.0)
Protein, ur: NEGATIVE mg/dL
SPECIFIC GRAVITY, URINE: 1.018 (ref 1.005–1.030)

## 2016-01-10 LAB — SURGICAL PCR SCREEN
MRSA, PCR: NEGATIVE
STAPHYLOCOCCUS AUREUS: POSITIVE — AB

## 2016-01-10 LAB — GLUCOSE, CAPILLARY: Glucose-Capillary: 274 mg/dL — ABNORMAL HIGH (ref 65–99)

## 2016-01-10 LAB — PROTIME-INR
INR: 1.02 (ref 0.00–1.49)
Prothrombin Time: 13.6 seconds (ref 11.6–15.2)

## 2016-01-10 LAB — APTT: aPTT: 26 seconds (ref 24–37)

## 2016-01-10 NOTE — Progress Notes (Signed)
Nurse called [facility] [place]. Then Nurse called and spoke with patients wife [name] and informed her to tell patient that he needed to pick up ointment from pharmacy and to use it and also bring it DOS. (Per Colletta Maryland at Dr. Lianne Moris office, patient is to take aspirin DOS) Patients wife also informed of this. [name] verbalized understanding.

## 2016-01-10 NOTE — Progress Notes (Signed)
PCP is Alain Honey  Cardiologist is Daneen Schick  Patient informed Nurse that he had a OV with Dr. [name] yesterday. Patients wife accompanied him to PAT and she provided Nurse with the EKG that was done on yesterday. EKG placed on chart.  Nurse inquired about kidney functioning, and patient denied having any issues with his kidney's or having a kidney doctor.   Nurse inquired about CBG and patient informed Nurse that his blood sugars range from "less than 200- to the 90s." CBG on arrival to PAT was 274, and patient informed Nurse that he consumed a ham biscuit and drank two cups of black coffee.   Nurse called and left a voicemail with Colletta Maryland at Dr. Lianne Moris office to see if patient needed to take aspirin the DOS. Direct call back number left.   Ebony Hail, [place] informed of patient's history and scheduled surgery on Monday 01/13/16

## 2016-01-10 NOTE — Progress Notes (Signed)
Patient positive for antibodies, will need a STAT T&S DOS.

## 2016-01-10 NOTE — Progress Notes (Signed)
Anesthesia Chart Review: Patient is a 75 year old male scheduled for right carotid endarterectomy on 01/13/16 by Dr. Bridgett Larsson. Patient with previous CVA 07/2013 (effecting left eye) and TIA 09/8451 (LICA occlusion) who underwent recent carotid angiogram 11/18/15 (to evaluate RICA stenosis) complicated by bi-cerebral infarcts with left hemiparesis. Right CEA recommended to reduce future CVA risk.   Other history includes former smoker, carotid occlusive disease, CVA/TIA, HLD, HTN, severe AS s/p AVR (70m pericardial) 11/01/08 (Dr. BCyndia Bent, CAD (mild to moderate LAD '10), CHF, afib, anxiety, DM2, back surgery.  PCP is Dr. SAlain Honey Cardiologist is Dr. HDaneen Schickwho cleared patient following recent stress test.  Meds include amlodipine, ASA 81 mg, Lipitor, HCTZ, lisinopril, metformin, Toprol XL.   09/13/15 EKG: NSR, non-specific ST abnormality. (There is also a 01/09/16 tracing in [facility] from his stress test showing NSR, non-specific ST/T wave abnormality.  01/08/26 Nuclear stress test:   Nuclear stress EF: 63%.  Findings consistent with prior myocardial infarction with peri-infarct ischemia.  This is a low risk study.  The left ventricular ejection fraction is normal (55-65%). Small inferolateral wall infarct at mid and basal level with mild peri infarct ischemia. EF 63%.  Dr. STamala Julianreviewed and wrote, "Let the patient know the stress test is low risk. He is cleared for the upcoming carotid procedure by Dr. CBridgett Larsson"   11/20/15 Echo: Study Conclusions - Left ventricle: Posterior basal hypokinesis The cavity size was  normal. Wall thickness was normal. The estimated ejection  fraction was 55%. - Aortic valve: Normal appearing bioprosthetic AV. Valve area  (VTI): 1.09 cm^2. Valve area (Vmax): 0.95 cm^2. Valve area  (Vmean): 1.06 cm^2. - Mitral valve: Calcified annulus. Mildly thickened leaflets . - Left atrium: The atrium was mildly dilated. - Atrial septum: No defect or patent foramen ovale was  identified. Impressions: - No cardiac source of emboli was indentified.  10/29/08 Cardiac cath: CONCLUSION: 1. Severe aortic stenosis with an aortic valve area between 0.88 and  1.0 cm2 depending upon the cardiac output used. It was felt that  the patient has a calcific and possibly bicuspid aortic valve. 2. Moderately severe pulmonary hypertension with pulmonary artery  pressure of 50/12 mmHg. 3. Low normal left ventricular function with an ejection fraction of  50-55%. 4. Mild-to-moderate mid- left anterior descending and mid-to-distal  circumflex. 5. Calcified ascending aorta. RECOMMENDATIONS: Consideration for aortic valve replacement and a possible coronary artery bypass grafting by Dr. BCyndia Bent  11/18/15 Arch aortogram with right carotid/cerebral angiogram: FINDING(S):  Patent type aortic arch  Patent innominate artery, left common carotid artery and left subclavian artery  Patent right subclavian artery and right common carotid artery  Patent right internal carotid artery with stenosis >80%  Occluded right external carotid artery   Occluded left internal carotid artery   Intracranial findings per neuroradiology  Preoperative labs noted. Cr 1.57, down from 2.00 on 12/02/15 (Cr 1.4-2.0 since 09/20/15). Glucose 243. PT/PTT and CBC WNL. A1c PENDING. (A1c on 09/20/15 7.3.) He reported fasting CBGs run 200 or less.  If no acute changes then I would anticipate that he could proceed as planned.  AGeorge HughMHosp Metropolitano De San JuanShort Stay Center/Anesthesiology Phone [phone]/23/2017 1:06 PM

## 2016-01-11 LAB — HEMOGLOBIN A1C
HEMOGLOBIN A1C: 7.2 % — AB (ref 4.8–5.6)
MEAN PLASMA GLUCOSE: 160 mg/dL

## 2016-01-12 NOTE — Anesthesia Preprocedure Evaluation (Signed)
Anesthesia Evaluation  Patient identified by MRN, date of birth, ID band Patient awake    Reviewed: Allergy & Precautions, NPO status , Patient's Chart, lab work & pertinent test results, reviewed documented beta blocker date and time   Airway Mallampati: II  TM Distance: >3 FB Neck ROM: Full    Dental no notable dental hx.    Pulmonary neg pulmonary ROS, former smoker,    Pulmonary exam normal breath sounds clear to auscultation       Cardiovascular hypertension, Pt. on home beta blockers and Pt. on medications + CAD, + Peripheral Vascular Disease and +CHF  Normal cardiovascular exam Rhythm:Regular Rate:Normal     Neuro/Psych PSYCHIATRIC DISORDERS Anxiety TIACVA    GI/Hepatic negative GI ROS, Neg liver ROS,   Endo/Other  negative endocrine ROSdiabetes  Renal/GU Renal diseasenegative Renal ROS     Musculoskeletal negative musculoskeletal ROS (+) Arthritis ,   Abdominal   Peds  Hematology negative hematology ROS (+)   Anesthesia Other Findings   Reproductive/Obstetrics                             Anesthesia Physical Anesthesia Plan  ASA: III  Anesthesia Plan: General   Post-op Pain Management:    Induction: Intravenous  Airway Management Planned: Oral ETT  Additional Equipment: Arterial line  Intra-op Plan:   Post-operative Plan: Extubation in OR  Informed Consent: I have reviewed the patients History and Physical, chart, labs and discussed the procedure including the risks, benefits and alternatives for the proposed anesthesia with the patient or authorized representative who has indicated his/her understanding and acceptance.   Dental advisory given  Plan Discussed with: CRNA  Anesthesia Plan Comments: (2x PIV remi gtt)        Anesthesia Quick Evaluation

## 2016-01-13 ENCOUNTER — Inpatient Hospital Stay (HOSPITAL_COMMUNITY)
Admission: RE | Admit: 2016-01-13 | Discharge: 2016-01-14 | DRG: 039 | Disposition: A | Discharge status: Home / Self Care | Payer: BLUE CROSS/BLUE SHIELD | Source: Ambulatory Visit | Attending: Vascular Surgery | Admitting: Vascular Surgery

## 2016-01-13 ENCOUNTER — Inpatient Hospital Stay (HOSPITAL_COMMUNITY): Payer: BLUE CROSS/BLUE SHIELD | Admitting: Certified Registered Nurse Anesthetist

## 2016-01-13 ENCOUNTER — Encounter (HOSPITAL_COMMUNITY): Payer: Self-pay

## 2016-01-13 ENCOUNTER — Inpatient Hospital Stay (HOSPITAL_COMMUNITY): Payer: BLUE CROSS/BLUE SHIELD | Admitting: Vascular Surgery

## 2016-01-13 ENCOUNTER — Encounter (HOSPITAL_COMMUNITY)
Admission: RE | Disposition: A | Discharge status: Home / Self Care | Payer: Self-pay | Source: Ambulatory Visit | Attending: Vascular Surgery

## 2016-01-13 DIAGNOSIS — H5340 Unspecified visual field defects: Secondary | ICD-10-CM | POA: Diagnosis present

## 2016-01-13 DIAGNOSIS — Z7984 Long term (current) use of oral hypoglycemic drugs: Secondary | ICD-10-CM | POA: Diagnosis not present

## 2016-01-13 DIAGNOSIS — I509 Heart failure, unspecified: Secondary | ICD-10-CM | POA: Diagnosis present

## 2016-01-13 DIAGNOSIS — E785 Hyperlipidemia, unspecified: Secondary | ICD-10-CM | POA: Diagnosis present

## 2016-01-13 DIAGNOSIS — Z87891 Personal history of nicotine dependence: Secondary | ICD-10-CM

## 2016-01-13 DIAGNOSIS — E1151 Type 2 diabetes mellitus with diabetic peripheral angiopathy without gangrene: Secondary | ICD-10-CM | POA: Diagnosis present

## 2016-01-13 DIAGNOSIS — I6521 Occlusion and stenosis of right carotid artery: Secondary | ICD-10-CM | POA: Diagnosis not present

## 2016-01-13 DIAGNOSIS — I779 Disorder of arteries and arterioles, unspecified: Secondary | ICD-10-CM | POA: Diagnosis present

## 2016-01-13 DIAGNOSIS — I2510 Atherosclerotic heart disease of native coronary artery without angina pectoris: Secondary | ICD-10-CM | POA: Diagnosis present

## 2016-01-13 DIAGNOSIS — R4701 Aphasia: Secondary | ICD-10-CM | POA: Diagnosis present

## 2016-01-13 DIAGNOSIS — I110 Hypertensive heart disease with heart failure: Secondary | ICD-10-CM | POA: Diagnosis present

## 2016-01-13 DIAGNOSIS — Z8673 Personal history of transient ischemic attack (TIA), and cerebral infarction without residual deficits: Secondary | ICD-10-CM | POA: Diagnosis not present

## 2016-01-13 DIAGNOSIS — I4891 Unspecified atrial fibrillation: Secondary | ICD-10-CM | POA: Diagnosis not present

## 2016-01-13 DIAGNOSIS — Z79899 Other long term (current) drug therapy: Secondary | ICD-10-CM

## 2016-01-13 DIAGNOSIS — Z7982 Long term (current) use of aspirin: Secondary | ICD-10-CM | POA: Diagnosis not present

## 2016-01-13 DIAGNOSIS — Z952 Presence of prosthetic heart valve: Secondary | ICD-10-CM | POA: Diagnosis not present

## 2016-01-13 HISTORY — PX: ENDARTERECTOMY: SHX5162

## 2016-01-13 LAB — GLUCOSE, CAPILLARY
GLUCOSE-CAPILLARY: 167 mg/dL — AB (ref 65–99)
GLUCOSE-CAPILLARY: 184 mg/dL — AB (ref 65–99)
GLUCOSE-CAPILLARY: 159 mg/dL — AB (ref 65–99)
Glucose-Capillary: 181 mg/dL — ABNORMAL HIGH (ref 65–99)
Glucose-Capillary: 204 mg/dL — ABNORMAL HIGH (ref 65–99)

## 2016-01-13 LAB — TYPE AND SCREEN
ABO/RH(D): A NEG
ANTIBODY SCREEN: POSITIVE

## 2016-01-13 SURGERY — ENDARTERECTOMY, CAROTID
Anesthesia: General | Site: Neck | Laterality: Right

## 2016-01-13 MED ORDER — HEPARIN SODIUM (PORCINE) 1000 UNIT/ML IJ SOLN
INTRAMUSCULAR | Status: DC | PRN
  Administered 2016-01-13: 2000 [IU] via INTRAVENOUS
  Administered 2016-01-13: 8000 [IU] via INTRAVENOUS

## 2016-01-13 MED ORDER — PROPOFOL 10 MG/ML IV BOLUS
INTRAVENOUS | Status: AC
  Filled 2016-01-13: qty 20

## 2016-01-13 MED ORDER — PHENYLEPHRINE HCL 10 MG/ML IJ SOLN
10.0000 mg | INTRAMUSCULAR | Status: DC | PRN
  Administered 2016-01-13: 25 ug/min via INTRAVENOUS

## 2016-01-13 MED ORDER — PROMETHAZINE HCL 25 MG/ML IJ SOLN: 6.2500 mg | INTRAMUSCULAR | Status: DC | PRN

## 2016-01-13 MED ORDER — HYDRALAZINE HCL 20 MG/ML IJ SOLN: 5.0000 mg | INTRAMUSCULAR | Status: DC | PRN

## 2016-01-13 MED ORDER — LACTATED RINGERS IV SOLN
INTRAVENOUS | Status: DC | PRN
  Administered 2016-01-13: 10:00:00 via INTRAVENOUS

## 2016-01-13 MED ORDER — ALUM & MAG HYDROXIDE-SIMETH 200-200-20 MG/5ML PO SUSP: 15.0000 mL | ORAL | Status: DC | PRN

## 2016-01-13 MED ORDER — METOPROLOL SUCCINATE ER 50 MG PO TB24
100.0000 mg | ORAL_TABLET | Freq: Every day | ORAL | Status: DC
  Filled 2016-01-13: qty 2

## 2016-01-13 MED ORDER — PROTAMINE SULFATE 10 MG/ML IV SOLN
INTRAVENOUS | Status: DC | PRN
  Administered 2016-01-13: 40 mg via INTRAVENOUS

## 2016-01-13 MED ORDER — FENTANYL CITRATE (PF) 100 MCG/2ML IJ SOLN
INTRAMUSCULAR | Status: AC
  Filled 2016-01-13: qty 2

## 2016-01-13 MED ORDER — DOCUSATE SODIUM 100 MG PO CAPS
100.0000 mg | ORAL_CAPSULE | Freq: Every day | ORAL | Status: DC
  Administered 2016-01-14: 100 mg via ORAL
  Filled 2016-01-13: qty 1

## 2016-01-13 MED ORDER — INSULIN ASPART 100 UNIT/ML ~~LOC~~ SOLN
0.0000 [IU] | Freq: Three times a day (TID) | SUBCUTANEOUS | Status: DC
  Administered 2016-01-13 – 2016-01-14 (×2): 2 [IU] via SUBCUTANEOUS

## 2016-01-13 MED ORDER — POTASSIUM CHLORIDE CRYS ER 20 MEQ PO TBCR: 20.0000 meq | EXTENDED_RELEASE_TABLET | Freq: Every day | ORAL | Status: DC | PRN

## 2016-01-13 MED ORDER — PROPOFOL 10 MG/ML IV BOLUS
INTRAVENOUS | Status: DC | PRN
  Administered 2016-01-13: 150 mg via INTRAVENOUS
  Administered 2016-01-13: 50 mg via INTRAVENOUS

## 2016-01-13 MED ORDER — ASPIRIN EC 81 MG PO TBEC
81.0000 mg | DELAYED_RELEASE_TABLET | Freq: Every day | ORAL | Status: DC
  Administered 2016-01-14: 81 mg via ORAL
  Filled 2016-01-13: qty 1

## 2016-01-13 MED ORDER — DEXTRAN 40 IN SALINE 10-0.9 % IV SOLN
INTRAVENOUS | Status: DC | PRN
  Administered 2016-01-13: 500 mL

## 2016-01-13 MED ORDER — HEMOSTATIC AGENTS (NO CHARGE) OPTIME
TOPICAL | Status: DC | PRN
  Administered 2016-01-13: 1 via TOPICAL

## 2016-01-13 MED ORDER — NEOSTIGMINE METHYLSULFATE 10 MG/10ML IV SOLN
INTRAVENOUS | Status: DC | PRN
  Administered 2016-01-13: 2 mg via INTRAVENOUS

## 2016-01-13 MED ORDER — CHLORHEXIDINE GLUCONATE CLOTH 2 % EX PADS: 6.0000 | MEDICATED_PAD | Freq: Once | CUTANEOUS | Status: DC

## 2016-01-13 MED ORDER — DEXTRAN 40 IN SALINE 10-0.9 % IV SOLN
INTRAVENOUS | Status: AC
  Filled 2016-01-13: qty 500

## 2016-01-13 MED ORDER — LABETALOL HCL 5 MG/ML IV SOLN
10.0000 mg | INTRAVENOUS | Status: DC | PRN
Start: 2016-01-13 — End: 2016-01-14

## 2016-01-13 MED ORDER — LIDOCAINE HCL (CARDIAC) 20 MG/ML IV SOLN
INTRAVENOUS | Status: DC | PRN
  Administered 2016-01-13: 40 mg via INTRAVENOUS

## 2016-01-13 MED ORDER — PANTOPRAZOLE SODIUM 40 MG PO TBEC
40.0000 mg | DELAYED_RELEASE_TABLET | Freq: Every day | ORAL | Status: DC
  Administered 2016-01-13 – 2016-01-14 (×2): 40 mg via ORAL
  Filled 2016-01-13 (×2): qty 1

## 2016-01-13 MED ORDER — GUAIFENESIN-DM 100-10 MG/5ML PO SYRP: 15.0000 mL | ORAL_SOLUTION | ORAL | Status: DC | PRN

## 2016-01-13 MED ORDER — GLYCOPYRROLATE 0.2 MG/ML IJ SOLN
INTRAMUSCULAR | Status: DC | PRN
  Administered 2016-01-13: 0.3 mg via INTRAVENOUS

## 2016-01-13 MED ORDER — ATORVASTATIN CALCIUM 40 MG PO TABS
40.0000 mg | ORAL_TABLET | Freq: Every day | ORAL | Status: DC
  Administered 2016-01-13: 40 mg via ORAL
  Filled 2016-01-13: qty 1

## 2016-01-13 MED ORDER — CHLORHEXIDINE GLUCONATE CLOTH 2 % EX PADS
6.0000 | MEDICATED_PAD | Freq: Every day | CUTANEOUS | Status: DC
  Administered 2016-01-13 – 2016-01-14 (×2): 6 via TOPICAL

## 2016-01-13 MED ORDER — DEXTROSE 5 % IV SOLN
1.5000 g | INTRAVENOUS | Status: AC
  Administered 2016-01-13: 1.5 g via INTRAVENOUS

## 2016-01-13 MED ORDER — DEXTROSE 5 % IV SOLN
INTRAVENOUS | Status: AC
  Filled 2016-01-13: qty 1.5

## 2016-01-13 MED ORDER — MORPHINE SULFATE (PF) 2 MG/ML IV SOLN: 1.0000 mg | INTRAVENOUS | Status: DC | PRN

## 2016-01-13 MED ORDER — ROCURONIUM BROMIDE 100 MG/10ML IV SOLN
INTRAVENOUS | Status: DC | PRN
  Administered 2016-01-13: 10 mg via INTRAVENOUS
  Administered 2016-01-13: 40 mg via INTRAVENOUS

## 2016-01-13 MED ORDER — HEPARIN SODIUM (PORCINE) 5000 UNIT/ML IJ SOLN
INTRAMUSCULAR | Status: DC | PRN
  Administered 2016-01-13: 12:00:00

## 2016-01-13 MED ORDER — MEPERIDINE HCL 25 MG/ML IJ SOLN: 6.2500 mg | INTRAMUSCULAR | Status: DC | PRN

## 2016-01-13 MED ORDER — CEFUROXIME SODIUM 1.5 G IJ SOLR
1.5000 g | Freq: Two times a day (BID) | INTRAMUSCULAR | Status: AC
  Administered 2016-01-13 – 2016-01-14 (×2): 1.5 g via INTRAVENOUS
  Filled 2016-01-13 (×2): qty 1.5

## 2016-01-13 MED ORDER — PHENOL 1.4 % MT LIQD
1.0000 | OROMUCOSAL | Status: DC | PRN
Start: 2016-01-13 — End: 2016-01-14
  Administered 2016-01-13: 1 via OROMUCOSAL
  Filled 2016-01-13: qty 177

## 2016-01-13 MED ORDER — ONDANSETRON HCL 4 MG/2ML IJ SOLN
INTRAMUSCULAR | Status: DC | PRN
  Administered 2016-01-13: 4 mg via INTRAVENOUS

## 2016-01-13 MED ORDER — ENOXAPARIN SODIUM 40 MG/0.4ML ~~LOC~~ SOLN
40.0000 mg | SUBCUTANEOUS | Status: DC
Start: 2016-01-14 — End: 2016-01-14

## 2016-01-13 MED ORDER — FENTANYL CITRATE (PF) 100 MCG/2ML IJ SOLN
25.0000 ug | INTRAMUSCULAR | Status: DC | PRN
  Administered 2016-01-13 (×2): 25 ug via INTRAVENOUS

## 2016-01-13 MED ORDER — LIDOCAINE HCL (PF) 1 % IJ SOLN
INTRAMUSCULAR | Status: AC
  Filled 2016-01-13: qty 30

## 2016-01-13 MED ORDER — SODIUM CHLORIDE 0.9 % IV SOLN
INTRAVENOUS | Status: DC
  Administered 2016-01-13: 17:00:00 via INTRAVENOUS

## 2016-01-13 MED ORDER — OXYCODONE-ACETAMINOPHEN 5-325 MG PO TABS: 1.0000 | ORAL_TABLET | ORAL | Status: DC | PRN

## 2016-01-13 MED ORDER — FENTANYL CITRATE (PF) 250 MCG/5ML IJ SOLN
INTRAMUSCULAR | Status: AC
  Filled 2016-01-13: qty 5

## 2016-01-13 MED ORDER — HYDROCHLOROTHIAZIDE 25 MG PO TABS
25.0000 mg | ORAL_TABLET | Freq: Every day | ORAL | Status: DC
  Administered 2016-01-14: 25 mg via ORAL
  Filled 2016-01-13: qty 1

## 2016-01-13 MED ORDER — ACETAMINOPHEN 325 MG RE SUPP: 325.0000 mg | RECTAL | Status: DC | PRN

## 2016-01-13 MED ORDER — INSULIN ASPART 100 UNIT/ML ~~LOC~~ SOLN
SUBCUTANEOUS | Status: AC
  Filled 2016-01-13: qty 3

## 2016-01-13 MED ORDER — EPHEDRINE SULFATE 50 MG/ML IJ SOLN
INTRAMUSCULAR | Status: DC | PRN
  Administered 2016-01-13 (×2): 5 mg via INTRAVENOUS

## 2016-01-13 MED ORDER — SODIUM CHLORIDE 0.9 % IV SOLN
0.0125 ug/kg/min | INTRAVENOUS | Status: AC
  Administered 2016-01-13: .05 ug/kg/min via INTRAVENOUS
  Filled 2016-01-13: qty 2000

## 2016-01-13 MED ORDER — 0.9 % SODIUM CHLORIDE (POUR BTL) OPTIME
TOPICAL | Status: DC | PRN
  Administered 2016-01-13 (×2): 1000 mL

## 2016-01-13 MED ORDER — ACETAMINOPHEN 325 MG PO TABS: 325.0000 mg | ORAL_TABLET | ORAL | Status: DC | PRN

## 2016-01-13 MED ORDER — METOPROLOL TARTRATE 5 MG/5ML IV SOLN: 2.0000 mg | INTRAVENOUS | Status: DC | PRN

## 2016-01-13 MED ORDER — LISINOPRIL 40 MG PO TABS
40.0000 mg | ORAL_TABLET | Freq: Every day | ORAL | Status: DC
  Administered 2016-01-14: 40 mg via ORAL
  Filled 2016-01-13: qty 1

## 2016-01-13 MED ORDER — METFORMIN HCL 500 MG PO TABS
1000.0000 mg | ORAL_TABLET | Freq: Two times a day (BID) | ORAL | Status: DC
  Administered 2016-01-14: 1000 mg via ORAL
  Filled 2016-01-13: qty 2

## 2016-01-13 MED ORDER — ONDANSETRON HCL 4 MG/2ML IJ SOLN: 4.0000 mg | Freq: Four times a day (QID) | INTRAMUSCULAR | Status: DC | PRN

## 2016-01-13 MED ORDER — MUPIROCIN 2 % EX OINT
1.0000 "application " | TOPICAL_OINTMENT | Freq: Two times a day (BID) | CUTANEOUS | Status: DC
  Administered 2016-01-13 – 2016-01-14 (×2): 1 via NASAL

## 2016-01-13 MED ORDER — FENTANYL CITRATE (PF) 100 MCG/2ML IJ SOLN
INTRAMUSCULAR | Status: DC | PRN
  Administered 2016-01-13: 200 ug via INTRAVENOUS
  Administered 2016-01-13: 50 ug via INTRAVENOUS

## 2016-01-13 MED ORDER — MAGNESIUM SULFATE 2 GM/50ML IV SOLN: 2.0000 g | Freq: Every day | INTRAVENOUS | Status: DC | PRN

## 2016-01-13 MED ORDER — AMLODIPINE BESYLATE 10 MG PO TABS
10.0000 mg | ORAL_TABLET | Freq: Every day | ORAL | Status: DC
  Administered 2016-01-14: 10 mg via ORAL
  Filled 2016-01-13: qty 1

## 2016-01-13 MED ORDER — SODIUM CHLORIDE 0.9 % IV SOLN: 500.0000 mL | Freq: Once | INTRAVENOUS | Status: DC | PRN

## 2016-01-13 MED ORDER — PHENYLEPHRINE 40 MCG/ML (10ML) SYRINGE FOR IV PUSH (FOR BLOOD PRESSURE SUPPORT)
PREFILLED_SYRINGE | INTRAVENOUS | Status: AC
  Filled 2016-01-13: qty 40

## 2016-01-13 SURGICAL SUPPLY — 50 items
ADPR TBG 2 MALE LL ART (MISCELLANEOUS)
AGENT HMST SPONGE THK3/8 (HEMOSTASIS) ×1
BAG DECANTER FOR FLEXI CONT (MISCELLANEOUS) ×2 IMPLANT
CANISTER SUCTION 2500CC (MISCELLANEOUS) ×2 IMPLANT
CATH ROBINSON RED A/P 18FR (CATHETERS) ×2 IMPLANT
CATH SUCT 10FR WHISTLE TIP (CATHETERS) IMPLANT
CLIP TI MEDIUM 6 (CLIP) ×2 IMPLANT
CLIP TI WIDE RED SMALL 6 (CLIP) ×2 IMPLANT
COVER PROBE W GEL 5X96 (DRAPES) ×1 IMPLANT
CRADLE DONUT ADULT HEAD (MISCELLANEOUS) ×2 IMPLANT
ELECT REM PT RETURN 9FT ADLT (ELECTROSURGICAL) ×2
ELECTRODE REM PT RTRN 9FT ADLT (ELECTROSURGICAL) ×1 IMPLANT
GAUZE SPONGE 2X2 8PLY STRL LF (GAUZE/BANDAGES/DRESSINGS) IMPLANT
GAUZE SPONGE 4X4 12PLY STRL (GAUZE/BANDAGES/DRESSINGS) ×4 IMPLANT
GLOVE BIO SURGEON STRL SZ7 (GLOVE) ×2 IMPLANT
GLOVE BIOGEL PI IND STRL 7.5 (GLOVE) ×1 IMPLANT
GLOVE BIOGEL PI INDICATOR 7.5 (GLOVE) ×1
GOWN STRL REUS W/ TWL LRG LVL3 (GOWN DISPOSABLE) ×3 IMPLANT
GOWN STRL REUS W/TWL LRG LVL3 (GOWN DISPOSABLE) ×6
HEMOSTAT SPONGE AVITENE ULTRA (HEMOSTASIS) ×1 IMPLANT
IV ADAPTER SYR DOUBLE MALE LL (MISCELLANEOUS) IMPLANT
KIT BASIN OR (CUSTOM PROCEDURE TRAY) ×2 IMPLANT
KIT ROOM TURNOVER OR (KITS) ×2 IMPLANT
KIT SHUNT ARGYLE CAROTID ART 6 (VASCULAR PRODUCTS) ×1 IMPLANT
LIQUID BAND (GAUZE/BANDAGES/DRESSINGS) ×2 IMPLANT
NS IRRIG 1000ML POUR BTL (IV SOLUTION) ×6 IMPLANT
PACK CAROTID (CUSTOM PROCEDURE TRAY) ×2 IMPLANT
PAD ARMBOARD 7.5X6 YLW CONV (MISCELLANEOUS) ×4 IMPLANT
PATCH VASC XENOSURE 1CMX6CM (Vascular Products) ×2 IMPLANT
PATCH VASC XENOSURE 1X6 (Vascular Products) IMPLANT
SET COLLECT BLD 21X3/4 12 PB (MISCELLANEOUS) IMPLANT
SHUNT CAROTID BYPASS 10 (VASCULAR PRODUCTS) IMPLANT
SHUNT CAROTID BYPASS 12FRX15.5 (VASCULAR PRODUCTS) IMPLANT
SPONGE GAUZE 2X2 STER 10/PKG (GAUZE/BANDAGES/DRESSINGS) ×1
SPONGE INTESTINAL PEANUT (DISPOSABLE) ×2 IMPLANT
STOPCOCK 4 WAY LG BORE MALE ST (IV SETS) IMPLANT
SUT ETHILON 3 0 PS 1 (SUTURE) ×2 IMPLANT
SUT MNCRL AB 4-0 PS2 18 (SUTURE) ×2 IMPLANT
SUT PROLENE 6 0 BV (SUTURE) ×6 IMPLANT
SUT PROLENE 7 0 BV 1 (SUTURE) IMPLANT
SUT SILK 3 0 TIES 17X18 (SUTURE)
SUT SILK 3-0 18XBRD TIE BLK (SUTURE) IMPLANT
SUT VIC AB 3-0 SH 27 (SUTURE) ×2
SUT VIC AB 3-0 SH 27X BRD (SUTURE) ×1 IMPLANT
SYR TB 1ML LUER SLIP (SYRINGE) IMPLANT
SYSTEM CHEST DRAIN TLS 7FR (DRAIN) ×1 IMPLANT
TAPE CLOTH SURG 4X10 WHT LF (GAUZE/BANDAGES/DRESSINGS) ×1 IMPLANT
TUBING ART PRESS 48 MALE/FEM (TUBING) IMPLANT
TUBING EXTENTION W/L.L. (IV SETS) IMPLANT
WATER STERILE IRR 1000ML POUR (IV SOLUTION) ×2 IMPLANT

## 2016-01-13 NOTE — Transfer of Care (Signed)
Immediate Anesthesia Transfer of Care Note  Patient: Evan Stanley  Procedure(s) Performed: Procedure(s): ENDARTERECTOMY CAROTID - RIGHT (Right)  Patient Location: PACU  Anesthesia Type:General  Level of Consciousness: awake, alert , oriented and patient cooperative  Airway & Oxygen Therapy: Patient Spontanous Breathing and Patient connected to face mask oxygen  Post-op Assessment: Report given to RN, Post -op Vital signs reviewed and stable and Patient moving all extremities X 4  Post vital signs: Reviewed and stable  Last Vitals:  Filed Vitals:   01/13/16 0551  BP: 158/70  Pulse: 64  Temp: 36.6 C  Resp: 18    Last Pain: There were no vitals filed for this visit.       Complications: No apparent anesthesia complications

## 2016-01-13 NOTE — H&P (View-Only) (Signed)
Established Carotid Patient  History of Present Illness  Evan Stanley is a 75 y.o. (1940-10-08) male  who presents with chief complaint: resolved weakness. This patient underwent a technically effortless R carotid angiogram (11/18/15) which resulted in embolization suggesting friable plaque or thrombus in the aortic arch or carotid. This patient's prior sx included: L eye visual field loss, <1 hour episode of slurring of speech. His most recent sx were: discoordination of LLE. The patient's previous neurologic deficits have resolved.  Previous carotid studies demonstrated: RICA >99% stenosis, LICA occluded. The patient has never had amaurosis fugax but has developed partial visual field loss. The patient has never had facial drooping or hemiplegia. The patient has had expressive aphasia. The patient's previous neurologic deficits remain resolved. The patient's risks factors for carotid disease include: DM, HLD, HTN, afib.  The patient returns today to discuss proceeding with R CEA.  Past Medical History  Diagnosis Date  . Diabetes mellitus without complication ([place])   . Hyperlipidemia   . Hypertension   . CHF (congestive heart failure) ([place])   . Anxiety   . Atrial fibrillation ([place])   . Carotid artery stenosis   . TIA (transient ischemic attack)   . Decreased vision     left eye  . Stroke [facility])     Past Surgical History  Procedure Laterality Date  . Cardiac valve replacement    . Aortic valve replacement    . Back surgery    . Laser surgery, left eye  Left 10-29-2015     retinal surgery/ Deloria Lair MD   . Peripheral vascular catheterization Right 11/18/2015    Procedure: Carotid Angiography;  Surgeon: Conrad [place], MD;  Location: [facility];  Service: Cardiovascular;  Laterality: Right;  . Peripheral vascular catheterization N/A 11/18/2015    Procedure: Aortic Arch Angiography;  Surgeon: Conrad McColl, MD;  Location: [facility];  Service: Cardiovascular;   Laterality: N/A;    Social History   Social History  . Marital Status: Married    Spouse Name: N/A  . Number of Children: N/A  . Years of Education: N/A   Occupational History  . Not on file.   Social History Main Topics  . Smoking status: Former Smoker    Quit date: 07/20/1984  . Smokeless tobacco: Never Used  . Alcohol Use: No  . Drug Use: No  . Sexual Activity: Not on file   Other Topics Concern  . Not on file   Social History Narrative    Family History  Problem Relation Age of Onset  . Heart disease Mother   . Stroke Father 35    Current Outpatient Prescriptions  Medication Sig Dispense Refill  . amLODipine (NORVASC) 10 MG tablet Take 1 tablet (10 mg total) by mouth daily. 90 tablet 3  . aspirin EC 81 MG tablet Take 1 tablet (81 mg total) by mouth daily. 90 tablet 3  . atorvastatin (LIPITOR) 40 MG tablet Take 1 tablet (40 mg total) by mouth daily. 90 tablet 3  . hydrochlorothiazide (HYDRODIURIL) 25 MG tablet Take 1 tablet (25 mg total) by mouth daily. 90 tablet 3  . lisinopril (PRINIVIL,ZESTRIL) 40 MG tablet Take 1 tablet (40 mg total) by mouth daily. 90 tablet 3  . metFORMIN (GLUCOPHAGE) 1000 MG tablet Take 1 tablet (1,000 mg total) by mouth 2 (two) times daily with a meal. 180 tablet 3  . metoprolol succinate (TOPROL-XL) 100 MG 24 hr tablet Take 1 tablet (100 mg  total) by mouth daily. Take with or immediately following a meal. 90 tablet 3   No current facility-administered medications for this visit.     No Known Allergies   REVIEW OF SYSTEMS:  (Positives checked otherwise negative)  CARDIOVASCULAR:   [ ]  chest pain,  [ ]  chest pressure,  [ ]  palpitations,  [ ]  shortness of breath when laying flat,  [ ]  shortness of breath with exertion,   [ ]  pain in feet when walking,  [ ]  pain in feet when laying flat, [ ]  history of blood clot in veins (DVT),  [ ]  history of phlebitis,  [ ]  swelling in legs,  [ ]  varicose veins  PULMONARY:   [ ]  productive  cough,  [ ]  asthma,  [ ]  wheezing  NEUROLOGIC:   [x]  weakness in arms or legs,  [ ]  numbness in arms or legs,  [x]  difficulty speaking or slurred speech,  [x]  temporary loss of vision in one eye,  [ ]  dizziness  HEMATOLOGIC:   [ ]  bleeding problems,  [ ]  problems with blood clotting too easily  MUSCULOSKEL:   [ ]  joint pain, [ ]  joint swelling  GASTROINTEST:   [ ]  vomiting blood,  [ ]  blood in stool     GENITOURINARY:   [ ]  burning with urination,  [ ]  blood in urine  PSYCHIATRIC:   [ ]  history of major depression  INTEGUMENTARY:   [ ]  rashes,  [ ]  ulcers  CONSTITUTIONAL:   [ ]  fever,  [ ]  chills      Physical Examination  Filed Vitals:   01/03/16 1110 01/03/16 1128  BP: 147/77 132/70  Pulse: 57   Height: 5' 5"  (1.651 m)   Weight: 174 lb (78.926 kg)   SpO2: 98%    Body mass index is 28.96 kg/(m^2).   General: A&O x 3, WDWN  Eyes: PERRLA, EOMI  Neck: Supple, no nuchal rigidity, no palpable LAD  Pulmonary: Sym exp, good air movt, CTAB, no rales, rhonchi, & wheezing  Cardiac: RRR, Nl S1, S2, no Murmurs, rubs or gallops  Vascular: Vessel Right Left  Radial Palpable Palpable  Brachial Palpable Palpable  Carotid Palpable, without bruit Palpable, without bruit  Aorta Not palpable N/A  Femoral Palpable Palpable  Popliteal Not palpable Not palpable  PT NotPalpable NotPalpable  DP Not Palpable Palpable   Gastrointestinal: soft, NTND, no G/R, no HSM, no masses, no CVAT B  Musculoskeletal: M/S 5/5 throughout , Extremities without ischemic changes   Neurologic: CN 2-12 intact , Pain and light touch intact in extremities , Motor exam as listed above   Medical Decision Making  Jerre Vandrunen is a 75 y.o. ([birth year]) male who presents with: sx R ICA stenosis >80%, occluded L ICA, s/p R carotid angiogram complicated by CVA   The patient has decided to proceed with R CEA.  He is scheduled for 26 JUN 17.  I discussed with  the patient the risks, benefits, and alternatives to carotid endarterectomy.   The patient is aware that the risks of carotid endarterectomy include but are not limited to: bleeding, infection, stroke, myocardial infarction, death, cranial nerve injuries both temporary and permanent, neck hematoma, possible airway compromise, labile blood pressure post-operatively, cerebral hyperperfusion syndrome, and possible need for additional interventions in the future.   Additionally, will need clearance from his Cardiologist prior to proceeding. Reported he was seen in January this year and was told he was ok.  I discussed in depth with the  patient the nature of atherosclerosis, and emphasized the importance of maximal medical management including strict control of blood pressure, blood glucose, and lipid levels, antiplatelet agents, obtaining regular exercise, and cessation of smoking.   The patient is aware that without maximal medical management the underlying atherosclerotic disease process will progress, limiting the benefit of any interventions.  The patient is currently on a statin: Pravachol  The patient is currently on an anti-platelet: ASA.  Thank you for allowing [place] to participate in this patient's care.   [name], MD [facility] Office: [phone] Pager: [phone]

## 2016-01-13 NOTE — Anesthesia Procedure Notes (Signed)
Procedure Name: Intubation Date/Time: 01/13/2016 11:10 AM Performed by: Shirlyn Goltz Pre-anesthesia Checklist: Patient identified, Emergency Drugs available, Suction available and Patient being monitored Patient Re-evaluated:Patient Re-evaluated prior to inductionOxygen Delivery Method: Circle system utilized Preoxygenation: Pre-oxygenation with 100% oxygen Intubation Type: IV induction Ventilation: Mask ventilation without difficulty and Oral airway inserted - appropriate to patient size Laryngoscope Size: Mac and 3 Grade View: Grade I Tube type: Oral Tube size: 7.5 mm Number of attempts: 1 Airway Equipment and Method: Stylet and LTA kit utilized Placement Confirmation: ETT inserted through vocal cords under direct vision,  positive ETCO2 and breath sounds checked- equal and bilateral Secured at: 21 cm Tube secured with: Tape Dental Injury: Teeth and Oropharynx as per pre-operative assessment

## 2016-01-13 NOTE — Progress Notes (Signed)
Noted upon placing sacral pad, pt. Has large area of rash, irritation- rash, but no visibly opened skin seen  on buttocks & tops of thighs.

## 2016-01-13 NOTE — Progress Notes (Addendum)
  [facility] Day of Surgery Note  Subjective:  Patient seen in PACU. Neck incision hurts.  Filed Vitals:   01/13/16 1515 01/13/16 1530  BP: 107/61 111/65  Pulse:    Temp:    Resp:      Right neck incision without hematoma No smile asymmetry. Tongue midline. 5/5 strength upper extremities. Left leg slightly weaker than right (5/5).   Assessment/Plan:  This is a 75 y.o. male who is s/p right carotid endarterectomy   Stable post-op. Neuro exam intact. To 3S soon.    Virgina Jock, [place] Pager: [phone] 01/13/2016 3:38 PM

## 2016-01-13 NOTE — Anesthesia Postprocedure Evaluation (Signed)
Anesthesia Post Note  Patient: Evan Stanley  Procedure(s) Performed: Procedure(s) (LRB): ENDARTERECTOMY CAROTID - RIGHT (Right)  Patient location during evaluation: PACU Anesthesia Type: General Level of consciousness: sedated and patient cooperative Pain management: pain level controlled Vital Signs Assessment: post-procedure vital signs reviewed and stable Respiratory status: spontaneous breathing Cardiovascular status: stable Anesthetic complications: no    Last Vitals:  Filed Vitals:   01/13/16 1500 01/13/16 1515  BP: 109/61 107/61  Pulse:    Temp:    Resp:      Last Pain:  Filed Vitals:   01/13/16 1532  PainSc: Asleep                 Nolon Nations

## 2016-01-13 NOTE — Op Note (Signed)
OPERATIVE NOTE  PROCEDURE:   1.  right carotid endarterectomy with bovine patch angioplasty 2.  right intraoperative carotid ultrasound  PRE-OPERATIVE DIAGNOSIS: right symptomatic carotid stenosis >90%  POST-OPERATIVE DIAGNOSIS: same as above   SURGEON: [name], MD  ASSISTANT(S): Silva Bandy, PAC   ANESTHESIA: general  ESTIMATED BLOOD LOSS: 200 cc  FINDING(S): 1.  Continuous Doppler audible flow signatures are appropriate for each carotid artery. 2.  No evidence of intimal flap visualized on transverse or longitudinal ultrasonography. 3.  Calcified carotid plaque with necrotic core  SPECIMEN(S):  Carotid plaque (sent to Pathology)  INDICATIONS:   Evan Stanley is a 75 y.o. male who presents with right symptomatic carotid stenosis >90%%.  I discussed with the patient the risks, benefits, and alternatives to carotid endarterectomy.  I discussed the procedural details of carotid endarterectomy with the patient.  The patient is aware that the risks of carotid endarterectomy include but are not limited to: bleeding, infection, stroke, myocardial infarction, death, cranial nerve injuries both temporary and permanent, neck hematoma, possible airway compromise, labile blood pressure post-operatively, cerebral hyperperfusion syndrome, and possible need for additional interventions in the future. The patient is aware of the risks and agrees to proceed forward with the procedure.  DESCRIPTION: After full informed written consent was obtained from the patient, the patient was brought back to the operating room and placed supine upon the operating table.  Prior to induction, the patient received IV antibiotics.  After obtaining adequate anesthesia, the patient was placed into semi-Fowler position with a shoulder roll in place and the patient's neck slightly hyperextended and rotated away from the surgical site.  The patient was prepped in the standard fashion for a right carotid endarterectomy.   I made an incision anterior to the sternocleidomastoid muscle and dissected down through the subcutaneous tissue.  The platysmas was opened with electrocautery.  Then I dissected down to the internal jugular vein.  This was dissected posteriorly until I obtained visualization of the common carotid artery.  This was dissected out and then an umbilical tape was placed around the common carotid artery and I loosely applied a Rumel tourniquet.  I then dissected in a periadventitial fashion along the common carotid artery up to the bifurcation.  I then identified the external carotid artery and the superior thyroid artery.  A 2-0 silk tie was looped around the superior thyroid artery, and I also dissected out the external carotid artery and placed a vessel loop around it.  In continuing the dissection to the internal carotid artery, I identified the facial vein.  This was ligated and then transected, giving me improved exposure of the internal carotid artery.  In the process of this dissection, the hypoglossal nerve was identified.  I then dissected out the internal carotid artery until I identified an area of soft tissue in the internal carotid artery.  I dissected slightly distal to this area, and placed an umbilical tape around the artery and loosely applied a Rumel tourniquet.  At this point, we gave the patient a therapeutic bolus of Heparin intravenously (roughly 80 units/kg).  After waiting 3 minutes, then I clamped the internal carotid artery, external carotid artery and then the common carotid artery.  I then made an arteriotomy in the common carotid artery with a 11 blade, and extended the arteriotomy with a Potts scissor down into the common carotid artery, then I carried the arteriotomy through the bifurcation into the internal carotid artery until I reached an area that was  not diseased.  At this point, I took the 10 shunt that previously been prepared and I inserted it into the internal carotid artery.   The Rumel tourniquet was then applied to this end of the shunt.  I unclamped the shunt to verify retrograde blood flow in the internal carotid artery.  I then placed the other end of the shunt into the common carotid artery after unclamping the artery.  The Rumel was tightened down around the shunt.  At this point, I verified blood flow in the shunt with a continuous doppler.  At this point, I started the endarterectomy in the common carotid artery with a Technical brewer and carried this dissection down into the common carotid artery circumferentially.  Then I transected the plaque at a segment where it was adherent.  I then carried this dissection up into the external carotid artery.  The plaque was extracted by unclamping the external carotid artery and everting the artery.  The dissection was then carried into the internal carotid artery, extracting the remaining portion of the carotid plaque.  I passed the plaque off the field as a specimen.  I then spent the next 30 minutes removing intimal flaps and loose debris.  Eventually I reached the point where the residual plaque was densely adherent and any further dissection would compromise the integrity of the wall.  After verifying that there was no more loose intimal flaps or debris, I re-interrogated the entirety of this carotid artery.  At this point, I was satisfied that the minimal remaining disease was densely adherent to the wall and wall integrity was intact.  At this point, I then fashioned a bovine pericardial patch for the geometry of this artery and sewed it in place with two running stitch of 6-0 Prolene, one from each end.  Prior to completing this patch angioplasty, I removed the shunt first from the internal carotid artery, from which there was excellent backbleeding, and clamped it.  Then I removed the shunt from the common carotid artery, from which there was excellent antegrade bleeding, and then clamped it.  At this point, I allowed the  external carotid artery to backbleed, which was excellent.  Then I instilled heparinized saline in this patched artery and then completed the patch angioplasty in the usual fashion.  First, I released the clamp on the external carotid artery, then I released it on the common carotid artery.  After waiting a few seconds, I then released it on the internal carotid artery.  I then interrogated this patient's arteries with the continuous Doppler.  The audible waveforms in each artery were consistent with the expected characteristics for each artery.  The Sonosite probe was then sterilely draped and used to interrogate the carotid artery in both longitudinal and transverse views.  At this point, I washed out the wound, and placed thrombin and Gelfoam throughout.  I also gave the patient 30 mg of protamine to reverse his anticoagulation.   After waiting a few minutes, I removed the thrombin and Gelfoam and washed out the wound.  There was no more active bleeding in the surgical site but there was diffuse ooze and adjacent to the vagus nerve.  Subsequently, I elected to place a TLS drain.  I tunneled the trocar through the lower lateral aspect of the subcutaneous tissue and skin.  I transected the trocar and pulled the drain into position.  This was secured with a 3-0 Nylon tied to the the drain.  The drain was shortened to  appropriate length and placed adjacent to the carotid artery.  I then reapproximated the platysma muscle with a running stitch of 3-0 Vicryl.  The skin was then reapproximated with a running subcuticular 4-0 Monocryl stitch.  The skin was then cleaned, dried and Dermabond was used to reinforce the skin closure.  The test tube suction was applied to the TLS drain.  The patient woke without any problems, neurologically intact.      COMPLICATIONS: none  CONDITION: stable   [name], MD [facility] Office: [phone] Pager: [phone]  01/13/2016, 1:55  PM

## 2016-01-13 NOTE — Interval H&P Note (Signed)
History and Physical Interval Note:  01/13/2016 7:07 AM  Evan Stanley  has presented today for surgery, with the diagnosis of Right carotid artery stenosis I65.21  The various methods of treatment have been discussed with the patient and family. After consideration of risks, benefits and other options for treatment, the patient has consented to  Procedure(s): ENDARTERECTOMY CAROTID (Right) as a surgical intervention .  The patient's history has been reviewed, patient examined, no change in status, stable for surgery.  I have reviewed the patient's chart and labs.  Questions were answered to the patient's satisfaction.     [name]

## 2016-01-14 ENCOUNTER — Encounter (HOSPITAL_COMMUNITY): Payer: Self-pay | Admitting: Vascular Surgery

## 2016-01-14 LAB — CBC
HCT: 32.7 % — ABNORMAL LOW (ref 39.0–52.0)
HEMOGLOBIN: 10.8 g/dL — AB (ref 13.0–17.0)
MCH: 27.6 pg (ref 26.0–34.0)
MCHC: 33 g/dL (ref 30.0–36.0)
MCV: 83.4 fL (ref 78.0–100.0)
Platelets: 160 10*3/uL (ref 150–400)
RBC: 3.92 MIL/uL — AB (ref 4.22–5.81)
RDW: 14.2 % (ref 11.5–15.5)
WBC: 9 10*3/uL (ref 4.0–10.5)

## 2016-01-14 LAB — BASIC METABOLIC PANEL
ANION GAP: 8 (ref 5–15)
BUN: 14 mg/dL (ref 6–20)
CHLORIDE: 106 mmol/L (ref 101–111)
CO2: 26 mmol/L (ref 22–32)
Calcium: 8.7 mg/dL — ABNORMAL LOW (ref 8.9–10.3)
Creatinine, Ser: 1.34 mg/dL — ABNORMAL HIGH (ref 0.61–1.24)
GFR calc Af Amer: 59 mL/min — ABNORMAL LOW (ref >60)
GFR calc non Af Amer: 51 mL/min — ABNORMAL LOW (ref >60)
GLUCOSE: 148 mg/dL — AB (ref 65–99)
POTASSIUM: 3.9 mmol/L (ref 3.5–5.1)
SODIUM: 140 mmol/L (ref 135–145)

## 2016-01-14 LAB — GLUCOSE, CAPILLARY: Glucose-Capillary: 167 mg/dL — ABNORMAL HIGH (ref 65–99)

## 2016-01-14 MED ORDER — OXYCODONE-ACETAMINOPHEN 5-325 MG PO TABS: 1.0000 | ORAL_TABLET | ORAL | Status: DC | PRN

## 2016-01-14 NOTE — Progress Notes (Addendum)
  [facility] Progress Note  Subjective  - POD #1  No complaints other than neck being a bit sore.   Objective Filed Vitals:   01/14/16 0200 01/14/16 0300  BP: 117/51 113/61  Pulse: 56 58  Temp:  98.2 F (36.8 C)  Resp: 18 17    Intake/Output Summary (Last 24 hours) at 01/14/16 0721 Last data filed at 01/14/16 0500  Gross per 24 hour  Intake 2838.75 ml  Output    620 ml  Net 2218.75 ml    Right neck drain with low output. No hematoma.  Tongue midline. No facial droop. Residual mild left leg weakness. 5/5 strength remaining extremities.   Assessment/Planning: 75 y.o. male is s/p: right carotid endarterectomy 1 Day Post-Op    Neuro exam intact. Residual mild left leg weakness. Incision without hematoma. D/c drain.  Has ambulated and voided. D/c home if tolerating breakfast.    Alvia Grove 01/14/2016 7:21 AM --  Laboratory CBC    Component Value Date/Time   WBC 9.0 01/14/2016 0331   WBC 8.7 04/16/2014 1002   HGB 10.8* 01/14/2016 0331   HGB 15.0 04/16/2014 1002   HCT 32.7* 01/14/2016 0331   HCT 45.2 04/16/2014 1002   PLT 160 01/14/2016 0331    BMET    Component Value Date/Time   NA 140 01/14/2016 0331   NA 141 09/20/2015 0807   K 3.9 01/14/2016 0331   CL 106 01/14/2016 0331   CO2 26 01/14/2016 0331   GLUCOSE 148* 01/14/2016 0331   GLUCOSE 195* 09/20/2015 0807   BUN 14 01/14/2016 0331   BUN 12 09/20/2015 0807   CREATININE 1.34* 01/14/2016 0331   CALCIUM 8.7* 01/14/2016 0331   GFRNONAA 51* 01/14/2016 0331   GFRAA 59* 01/14/2016 0331    COAG Lab Results  Component Value Date   INR 1.02 01/10/2016   INR 1.02 11/18/2015   INR 0.99 10/22/2014   No results found for: PTT  Antibiotics Anti-infectives    Start     Dose/Rate Route Frequency Ordered Stop   01/13/16 2200  cefUROXime (ZINACEF) 1.5 g in dextrose 5 % 50 mL IVPB     1.5 g 100 mL/hr over 30 Minutes Intravenous Every 12 hours 01/13/16 1642 01/14/16 2159   01/13/16 0553  cefUROXime (ZINACEF) 1.5 g in dextrose 5 % 50 mL IVPB     1.5 g 100 mL/hr over 30 Minutes Intravenous 30 min pre-op 01/13/16 0553 01/13/16 1120   01/13/16 0532  dextrose 5 % with cefUROXime (ZINACEF) ADS Med    Comments:  Beverly Gust   : cabinet override      01/13/16 0532 01/13/16 [place], PA-C [facility] Office: [phone] Pager: [phone] 01/14/2016 7:21 AM    Addendum  I have independently interviewed and examined the patient, and I agree with the physician assistant's findings.  No neck hematoma.  Inc c/d/i.  Neuro intact.  Exam unchanged from pre-op.  Pt ambulating to bathroom ok.  [name], MD [facility] [place] Office: [phone] Pager: [phone]  01/14/2016, 9:14 AM

## 2016-01-14 NOTE — Discharge Summary (Signed)
[facility] Discharge Summary  Evan Stanley [birth year] 75 y.o. male  [id number]  Admission Date: 01/13/2016  Discharge Date: 01/14/2016  Physician: [name], MD  Admission Diagnosis: Right carotid artery stenosis I65.21  HPI:   This is a 75 y.o. male presented with chief complaint: resolved weakness. This patient underwent a technically effortless R carotid angiogram (11/18/15) which resulted in embolization suggesting friable plaque or thrombus in the aortic arch or carotid. This patient's prior sx included: L eye visual field loss, <1 hour episode of slurring of speech. His most recent sx were: discoordination of LLE. The patient's previous neurologic deficits have resolved.  Previous carotid studies demonstrated: RICA >38% stenosis, LICA occluded. The patient has never had amaurosis fugax but has developed partial visual field loss. The patient has never had facial drooping or hemiplegia. The patient has had expressive aphasia. The patient's previous neurologic deficits remain resolved. The patient's risks factors for carotid disease include: DM, HLD, HTN, afib.  The patient returns today to discuss proceeding with R CEA.  Hospital Course:  The patient was admitted to the hospital and taken to the operating room on 01/13/2016 and underwent right carotid endarterectomy.  The patient tolerated the procedure well and was transported to the PACU in stable condition.  By POD 1, the patient's neuro status was intact. He had some residual mild left lower extremity weakness. His neck incision was clean and intact without hematoma. His TLS drain had minimal output and was discontinued. He was ambulating and tolerating a diet without difficulty. He was discharged home on POD 1 in good condition.     Recent Labs  01/14/16 0331  NA 140  K 3.9  CL 106  CO2 26  GLUCOSE 148*  BUN 14  CALCIUM 8.7*    Recent Labs  01/14/16 0331  WBC 9.0  HGB 10.8*  HCT  32.7*  PLT 160   No results for input(s): INR in the last 72 hours.  Discharge Instructions:   The patient is discharged to home with extensive instructions on wound care and progressive ambulation.  They are instructed not to drive or perform any heavy lifting until returning to see the physician in his office.  Discharge Instructions    Call MD for:  redness, tenderness, or signs of infection (pain, swelling, bleeding, redness, odor or green/yellow discharge around incision site)    Complete by:  As directed      Call MD for:  severe or increased pain, loss or decreased feeling  in affected limb(s)    Complete by:  As directed      Call MD for:  temperature >100.5    Complete by:  As directed      Discharge wound care:    Complete by:  As directed   Wash wounds daily with soap and water and pat dry. Do not apply any creams or ointments on your incisions.     Driving Restrictions    Complete by:  As directed   No driving for 2 weeks     Increase activity slowly    Complete by:  As directed   Walk with assistance use walker or cane as needed     Lifting restrictions    Complete by:  As directed   No lifting for 2 weeks     Resume previous diet    Complete by:  As directed            Discharge Diagnosis:  Right carotid artery stenosis  I65.21  Secondary Diagnosis: Patient Active Problem List   Diagnosis Date Noted  . Symptomatic stenosis of right carotid artery 01/13/2016  . S/P aortic valve replacement with tissue 01/05/2016  . Benign essential HTN   . Folliculitis   . Slow transit constipation   . Hyponatremia   . Chronic renal insufficiency   . AKI (acute kidney injury) ([place])   . Leukocytosis   . Type 2 diabetes mellitus with peripheral neuropathy (HCC)   . CVA (cerebral infarction) 11/22/2015  . Carotid artery occlusion without infarction 10/26/2014  . Hyperlipidemia 07/05/2013  . Essential hypertension 07/05/2013  . CAD (coronary artery disease)   . DM  (diabetes mellitus) ([place])    Past Medical History  Diagnosis Date  . Hyperlipidemia   . Hypertension   . CHF (congestive heart failure) ([place])   . Anxiety   . Atrial fibrillation ([place])   . Carotid artery stenosis   . TIA (transient ischemic attack)   . Decreased vision     left eye  . Stroke ([place]) 11/18/2015    no deficits  . Urgency of urination   . Arthritis     Right shoulder  . Diabetes mellitus without complication ([place])     Takes Metformin      Medication List    TAKE these medications        amLODipine 10 MG tablet  Commonly known as:  NORVASC  Take 1 tablet (10 mg total) by mouth daily.     aspirin EC 81 MG tablet  Take 1 tablet (81 mg total) by mouth daily.     atorvastatin 40 MG tablet  Commonly known as:  LIPITOR  Take 1 tablet (40 mg total) by mouth daily.     hydrochlorothiazide 25 MG tablet  Commonly known as:  HYDRODIURIL  Take 1 tablet (25 mg total) by mouth daily.     lisinopril 40 MG tablet  Commonly known as:  PRINIVIL,ZESTRIL  Take 1 tablet (40 mg total) by mouth daily.     metFORMIN 1000 MG tablet  Commonly known as:  GLUCOPHAGE  Take 1 tablet (1,000 mg total) by mouth 2 (two) times daily with a meal.     metoprolol succinate 100 MG 24 hr tablet  Commonly known as:  TOPROL-XL  Take 1 tablet (100 mg total) by mouth daily. Take with or immediately following a meal.     oxyCODONE-acetaminophen 5-325 MG tablet  Commonly known as:  PERCOCET/ROXICET  Take 1-2 tablets by mouth every 4 (four) hours as needed for moderate pain.        Percocet #8 No Refill  Disposition: Home  Patient's condition: is Good  Follow up: 1. Dr.  Bridgett Larsson in 2 weeks.   Virgina Jock, PA-C [facility] [phone]  Addendum  I have independently interviewed and examined the patient, and I agree with the physician assistant's discharge summary.  This patient underwent an uneventful R CEA for sx R ICA stenosis >90%.  The patient post-op  course was uneventful and the patient was discharged on POD #1 with intact neurologic status, tolerating oral intake, no neck hematoma, and pain well controlled.  [name], MD [facility] [place] Office: [phone] Pager: [phone]  01/16/2016, 11:45 AM    --- For VQI Registry use --- Instructions: Press F2 to tab through selections.  Delete question if not applicable.   Modified Rankin score at D/C (0-6): 0  IV medication needed for:  1. Hypertension: No 2. Hypotension:  No  Post-op Complications: No  1. Post-op CVA or TIA: No  2. CN injury: No  3. Myocardial infarction: No  4.  CHF: No  5.  Dysrhythmia (new): No  6. Wound infection: No  7. Reperfusion symptoms: No  8. Return to OR: No  Discharge medications: Statin use:  Yes If No: [ ]  For Medical reasons, [ ]  Non-compliant, [ ]  Not-indicated ASA use:  Yes  If No: [ ]  For Medical reasons, [ ]  Non-compliant, [ ]  Not-indicated Beta blocker use:  Yes If No: [ ]  For Medical reasons, [ ]  Non-compliant, [ ]  Not-indicated ACE-Inhibitor use:  Yes If No: [ ]  For Medical reasons, [ ]  Non-compliant, [ ]  Not-indicated P2Y12 Antagonist use: No, [ ]  Plavix, [ ]  Plasugrel, [ ]  Ticlopinine, [ ]  Ticagrelor, [ ]  Other, [ ]  No for medical reason, [ ]  Non-compliant, [x ] Not-indicated Anti-coagulant use:  No, [ ]  Warfarin, [ ]  Rivaroxaban, [ ]  Dabigatran, [ ]  Other, [ ]  No for medical reason, [ ]  Non-compliant, [ x] Not-indicated

## 2016-01-14 NOTE — Care Management Note (Signed)
Case Management Note  Patient Details  Name: Evan Stanley MRN: [id number] Date of Birth: 1941-03-29  Subjective/Objective:   s/p: right carotid endarterectomy                 Action/Plan: Discharge Planning: AVS reviewed: NCM spoke to pt and wife at home. Wife at home to assist with his care. Able to ambulate independently. Has RW at home.    PCP Alain Honey MD Expected Discharge Date:  01/14/2016               Expected Discharge Plan:  Home/Self Care  In-House Referral:  NA  Discharge planning Services  CM Consult  Post Acute Care Choice:  NA Choice offered to:  NA  DME Arranged:  N/A DME Agency:  NA  HH Arranged:  NA HH Agency:  NA  Status of Service:  Completed, signed off  If discussed at [place] of Stay Meetings, dates discussed:    Additional Comments:  Erenest Rasher, RN 01/14/2016, 10:17 AM

## 2016-01-14 NOTE — Progress Notes (Signed)
Discharge instructions reviewed with patient and his wife Evan Stanley who is at bedside. Prescription given. Appointment information reviewed. S/S of infection and incision care reviewed. Patient and wife verbalize understanding of instructions. All questions answered to the family's satisfaction. Patient feels more comfortable using walker now, has a walker at home, told to use until he feels better on his feet. Pt feels unsteady on feet but is ambulating well.   VSS. Pt in no acute distress. PIV removed.

## 2016-01-15 LAB — TYPE AND SCREEN
ABO/RH(D): A NEG
ANTIBODY SCREEN: POSITIVE
DAT, IgG: NEGATIVE
DONOR AG TYPE: NEGATIVE
DONOR AG TYPE: NEGATIVE
Unit division: 0
Unit division: 0

## 2016-01-16 ENCOUNTER — Telehealth: Payer: Self-pay | Admitting: Vascular Surgery

## 2016-01-16 NOTE — Telephone Encounter (Signed)
Spoke to pt's wife to sch appt 01/29/16 at 3:00.

## 2016-01-16 NOTE — Telephone Encounter (Signed)
-----   Message from Mena Goes, RN sent at 01/14/2016 10:39 AM EDT ----- Regarding: schedule 2 week postop   ----- Message -----    From: Alvia Grove, PA-C    Sent: 01/14/2016   7:26 AM      To: [facility] Charge Pool  S/p right CEA 01/13/16  F/u with Dr. Bridgett Larsson in 2 weeks  Thanks Maudie Mercury

## 2016-01-24 ENCOUNTER — Encounter: Payer: Self-pay | Admitting: Vascular Surgery

## 2016-01-28 ENCOUNTER — Encounter: Payer: Self-pay | Admitting: Vascular Surgery

## 2016-01-29 ENCOUNTER — Encounter: Payer: Self-pay | Admitting: Vascular Surgery

## 2016-01-29 ENCOUNTER — Ambulatory Visit (INDEPENDENT_AMBULATORY_CARE_PROVIDER_SITE_OTHER): Payer: Self-pay | Admitting: Vascular Surgery

## 2016-01-29 VITALS — BP 125/71 | HR 59 | Temp 97.1°F | Resp 16 | Ht 65.0 in | Wt 172.0 lb

## 2016-01-29 DIAGNOSIS — I779 Disorder of arteries and arterioles, unspecified: Secondary | ICD-10-CM

## 2016-01-29 DIAGNOSIS — I739 Peripheral vascular disease, unspecified: Principal | ICD-10-CM

## 2016-01-29 NOTE — Progress Notes (Signed)
Postoperative Visit   History of Present Illness  Evan Stanley is a 75 y.o. male who presents for postoperative follow-up for: R CEA (Date: 01/13/16).  The patient's neck incision is healed.  The patient has had no stroke or TIA symptoms.  For VQI Use Only  PRE-ADM LIVING: Home  AMB STATUS: Ambulatory  Social History   Social History  . Marital Status: Married    Spouse Name: N/A  . Number of Children: N/A  . Years of Education: N/A   Occupational History  . Not on file.   Social History Main Topics  . Smoking status: Former Smoker    Quit date: 07/20/1984  . Smokeless tobacco: Never Used  . Alcohol Use: No  . Drug Use: No  . Sexual Activity: Not on file   Other Topics Concern  . Not on file   Social History Narrative    Current Outpatient Prescriptions on File Prior to Visit  Medication Sig Dispense Refill  . amLODipine (NORVASC) 10 MG tablet Take 1 tablet (10 mg total) by mouth daily. 90 tablet 3  . aspirin EC 81 MG tablet Take 1 tablet (81 mg total) by mouth daily. 90 tablet 3  . atorvastatin (LIPITOR) 40 MG tablet Take 1 tablet (40 mg total) by mouth daily. 90 tablet 3  . hydrochlorothiazide (HYDRODIURIL) 25 MG tablet Take 1 tablet (25 mg total) by mouth daily. 90 tablet 3  . lisinopril (PRINIVIL,ZESTRIL) 40 MG tablet Take 1 tablet (40 mg total) by mouth daily. 90 tablet 3  . metFORMIN (GLUCOPHAGE) 1000 MG tablet Take 1 tablet (1,000 mg total) by mouth 2 (two) times daily with a meal. 180 tablet 3  . metoprolol succinate (TOPROL-XL) 100 MG 24 hr tablet Take 1 tablet (100 mg total) by mouth daily. Take with or immediately following a meal. 90 tablet 3  . oxyCODONE-acetaminophen (PERCOCET/ROXICET) 5-325 MG tablet Take 1-2 tablets by mouth every 4 (four) hours as needed for moderate pain. 8 tablet 0   No current facility-administered medications on file prior to visit.    Physical Examination  Filed Vitals:   01/29/16 1455 01/29/16 1458  BP: 129/68 125/71   Pulse: 59 59  Temp: 97.1 F (36.2 C)   Resp: 16     R Neck: Incision is healed Neuro: CN 2-12 are intact, Motor strength is 5/5 bilaterally, sensation is grossly intact  Medical Decision Making  Evan Stanley is a 75 y.o. male who presents s/p R CEA.   The patient's neck incision is healing with no stroke symptoms. I discussed in depth with the patient the nature of atherosclerosis, and emphasized the importance of maximal medical management including strict control of blood pressure, blood glucose, and lipid levels, obtaining regular exercise, anti-platelet use and cessation of smoking.   The patient is currently on an antiplatelet: ASA. The patient is currently on a statin: Lipitor. The patient is aware that without maximal medical management the underlying atherosclerotic disease process will progress, limiting the benefit of any interventions. The patient's surveillance will included routine carotid duplex studies which will be completed in: 9 months, at which time the patient will be re-evaluated.   I emphasized the importance of routine surveillance of the carotid arteries as recurrence of stenosis is possible, especially with proper management of underlying atherosclerotic disease. The patient agrees to participate in their maximal medical care and routine surveillance.  Thank you for allowing [place] to participate in this patient's care.  [name], MD, FACS [facility] [place] Office: [phone] Pager: [phone]

## 2016-02-06 ENCOUNTER — Ambulatory Visit: Payer: BLUE CROSS/BLUE SHIELD | Admitting: Neurology

## 2016-04-03 ENCOUNTER — Encounter: Payer: Self-pay | Admitting: Family Medicine

## 2016-04-03 ENCOUNTER — Ambulatory Visit (INDEPENDENT_AMBULATORY_CARE_PROVIDER_SITE_OTHER): Payer: BLUE CROSS/BLUE SHIELD | Admitting: Family Medicine

## 2016-04-03 VITALS — BP 139/73 | HR 63 | Temp 98.1°F | Ht 65.0 in | Wt 175.4 lb

## 2016-04-03 DIAGNOSIS — I6522 Occlusion and stenosis of left carotid artery: Secondary | ICD-10-CM | POA: Diagnosis not present

## 2016-04-03 DIAGNOSIS — I6990 Unspecified sequelae of unspecified cerebrovascular disease: Secondary | ICD-10-CM | POA: Diagnosis not present

## 2016-04-03 DIAGNOSIS — I10 Essential (primary) hypertension: Secondary | ICD-10-CM | POA: Diagnosis not present

## 2016-04-03 DIAGNOSIS — E109 Type 1 diabetes mellitus without complications: Secondary | ICD-10-CM

## 2016-04-03 MED ORDER — ATORVASTATIN CALCIUM 40 MG PO TABS: 40.0000 mg | ORAL_TABLET | Freq: Every day | ORAL | 1 refills | Status: DC

## 2016-04-03 NOTE — Progress Notes (Signed)
Subjective:    Patient ID: Evan Stanley, male    DOB: 04/11/1941, 75 y.o.   MRN: [id number]  HPI 75 year old gentleman with history of hypertension carotid artery occlusion, diabetes and late effects of CVA. He has no symptoms or complaints today. He has experienced some nausea in the mornings after he's taken his pills and we talked about maybe moving some of the pills tonight time. Also the symptoms are better when he ate rectus more than just yogurt and that was another suggestion that he might try.  Patient Active Problem List   Diagnosis Date Noted  . Symptomatic stenosis of right carotid artery 01/13/2016  . S/P aortic valve replacement with tissue 01/05/2016  . Benign essential HTN   . Folliculitis   . Slow transit constipation   . Hyponatremia   . Chronic renal insufficiency   . AKI (acute kidney injury) ([place])   . Leukocytosis   . Type 2 diabetes mellitus with peripheral neuropathy (HCC)   . CVA (cerebral infarction) 11/22/2015  . Carotid artery occlusion without infarction 10/26/2014  . Hyperlipidemia 07/05/2013  . Essential hypertension 07/05/2013  . CAD (coronary artery disease)   . DM (diabetes mellitus) [facility])    Outpatient Encounter Prescriptions as of 04/03/2016  Medication Sig  . amLODipine (NORVASC) 10 MG tablet Take 1 tablet (10 mg total) by mouth daily.  Marland Kitchen aspirin EC 81 MG tablet Take 1 tablet (81 mg total) by mouth daily.  Marland Kitchen atorvastatin (LIPITOR) 40 MG tablet Take 1 tablet (40 mg total) by mouth daily.  . cetirizine (ZYRTEC) 10 MG tablet Take 10 mg by mouth daily.  . hydrochlorothiazide (HYDRODIURIL) 25 MG tablet Take 1 tablet (25 mg total) by mouth daily.  Marland Kitchen latanoprost (XALATAN) 0.005 % ophthalmic solution 1 drop at bedtime.  Marland Kitchen lisinopril (PRINIVIL,ZESTRIL) 40 MG tablet Take 1 tablet (40 mg total) by mouth daily.  . metFORMIN (GLUCOPHAGE) 1000 MG tablet Take 1 tablet (1,000 mg total) by mouth 2 (two) times daily with a meal.  . metoprolol succinate (TOPROL-XL)  100 MG 24 hr tablet Take 1 tablet (100 mg total) by mouth daily. Take with or immediately following a meal.  . oxyCODONE-acetaminophen (PERCOCET/ROXICET) 5-325 MG tablet Take 1-2 tablets by mouth every 4 (four) hours as needed for moderate pain.  . pravastatin (PRAVACHOL) 40 MG tablet Take 40 mg by mouth daily.  . Pyridoxine HCl (VITAMIN B6 PO) Take by mouth daily.  . vitamin C (ASCORBIC ACID) 500 MG tablet Take 500 mg by mouth daily.  . vitamin E (VITAMIN E) 1000 UNIT capsule Take 1,000 Units by mouth daily.  . [DISCONTINUED] cephALEXin (KEFLEX) 500 MG capsule    No facility-administered encounter medications on file as of 04/03/2016.       Review of Systems  Constitutional: Negative.   HENT: Negative.   Respiratory: Negative.   Cardiovascular: Negative.   Neurological: Negative.        Objective:   Physical Exam  Constitutional: He is oriented to person, place, and time. He appears well-developed and well-nourished.  Neck:  Right carotid bruit status post endarterectomy. No palpable pulse on left carotid  Cardiovascular: Normal rate, regular rhythm and normal heart sounds.   Neurological: He is alert and oriented to person, place, and time.  Psychiatric: He has a normal mood and affect. His behavior is normal.   BP 139/73 (BP Location: Right Arm, Patient Position: Sitting, Cuff Size: Normal)   Pulse 63   Temp 98.1 F (36.7 C) (Oral)  Ht 5' 5"  (1.651 m)   Wt 175 lb 6.4 oz (79.6 kg)   BMI 29.19 kg/m         Assessment & Plan:  1. Late effects of CVA (cerebrovascular accident) M comes are stable with slow improvement  2. Benign essential HTN Blood pressure is fairly well controlled. No changes indicated  3. Carotid artery occlusion without infarction, left Has follow-up appointment with vascular surgeon in the near future for another ultrasound  4. Type 1 diabetes mellitus without complication (HCC) Last Z1Y was 7.2. I suggested we wait a few months since it has  been less than 3 months that was last checked. His diabetes seems to be stable.  Wardell Honour MD

## 2016-04-27 ENCOUNTER — Inpatient Hospital Stay (HOSPITAL_COMMUNITY)
Admission: AD | Admit: 2016-04-27 | Discharge: 2016-05-05 | DRG: 872 | Disposition: A | Discharge status: Home / Self Care | Payer: BLUE CROSS/BLUE SHIELD | Source: Other Acute Inpatient Hospital | Attending: Internal Medicine | Admitting: Internal Medicine

## 2016-04-27 DIAGNOSIS — K529 Noninfective gastroenteritis and colitis, unspecified: Secondary | ICD-10-CM | POA: Diagnosis present

## 2016-04-27 DIAGNOSIS — E119 Type 2 diabetes mellitus without complications: Secondary | ICD-10-CM

## 2016-04-27 DIAGNOSIS — Z952 Presence of prosthetic heart valve: Secondary | ICD-10-CM | POA: Diagnosis not present

## 2016-04-27 DIAGNOSIS — Z823 Family history of stroke: Secondary | ICD-10-CM | POA: Diagnosis not present

## 2016-04-27 DIAGNOSIS — A029 Salmonella infection, unspecified: Secondary | ICD-10-CM | POA: Diagnosis present

## 2016-04-27 DIAGNOSIS — R509 Fever, unspecified: Secondary | ICD-10-CM | POA: Diagnosis not present

## 2016-04-27 DIAGNOSIS — Z9889 Other specified postprocedural states: Secondary | ICD-10-CM | POA: Diagnosis not present

## 2016-04-27 DIAGNOSIS — R4701 Aphasia: Secondary | ICD-10-CM | POA: Diagnosis present

## 2016-04-27 DIAGNOSIS — I739 Peripheral vascular disease, unspecified: Secondary | ICD-10-CM | POA: Diagnosis present

## 2016-04-27 DIAGNOSIS — I130 Hypertensive heart and chronic kidney disease with heart failure and stage 1 through stage 4 chronic kidney disease, or unspecified chronic kidney disease: Secondary | ICD-10-CM | POA: Diagnosis present

## 2016-04-27 DIAGNOSIS — D696 Thrombocytopenia, unspecified: Secondary | ICD-10-CM | POA: Diagnosis present

## 2016-04-27 DIAGNOSIS — Z8673 Personal history of transient ischemic attack (TIA), and cerebral infarction without residual deficits: Secondary | ICD-10-CM | POA: Diagnosis not present

## 2016-04-27 DIAGNOSIS — I69354 Hemiplegia and hemiparesis following cerebral infarction affecting left non-dominant side: Secondary | ICD-10-CM

## 2016-04-27 DIAGNOSIS — E785 Hyperlipidemia, unspecified: Secondary | ICD-10-CM | POA: Diagnosis not present

## 2016-04-27 DIAGNOSIS — I152 Hypertension secondary to endocrine disorders: Secondary | ICD-10-CM | POA: Diagnosis present

## 2016-04-27 DIAGNOSIS — E876 Hypokalemia: Secondary | ICD-10-CM | POA: Diagnosis present

## 2016-04-27 DIAGNOSIS — A419 Sepsis, unspecified organism: Secondary | ICD-10-CM | POA: Diagnosis present

## 2016-04-27 DIAGNOSIS — E860 Dehydration: Secondary | ICD-10-CM | POA: Diagnosis present

## 2016-04-27 DIAGNOSIS — E1159 Type 2 diabetes mellitus with other circulatory complications: Secondary | ICD-10-CM | POA: Diagnosis present

## 2016-04-27 DIAGNOSIS — Z794 Long term (current) use of insulin: Secondary | ICD-10-CM

## 2016-04-27 DIAGNOSIS — R29810 Facial weakness: Secondary | ICD-10-CM | POA: Diagnosis present

## 2016-04-27 DIAGNOSIS — R531 Weakness: Secondary | ICD-10-CM | POA: Diagnosis not present

## 2016-04-27 DIAGNOSIS — I639 Cerebral infarction, unspecified: Secondary | ICD-10-CM | POA: Diagnosis not present

## 2016-04-27 DIAGNOSIS — G8194 Hemiplegia, unspecified affecting left nondominant side: Secondary | ICD-10-CM | POA: Diagnosis not present

## 2016-04-27 DIAGNOSIS — R6889 Other general symptoms and signs: Secondary | ICD-10-CM | POA: Diagnosis present

## 2016-04-27 DIAGNOSIS — Z87891 Personal history of nicotine dependence: Secondary | ICD-10-CM | POA: Diagnosis not present

## 2016-04-27 DIAGNOSIS — Z7984 Long term (current) use of oral hypoglycemic drugs: Secondary | ICD-10-CM

## 2016-04-27 DIAGNOSIS — I509 Heart failure, unspecified: Secondary | ICD-10-CM | POA: Diagnosis present

## 2016-04-27 DIAGNOSIS — I6522 Occlusion and stenosis of left carotid artery: Secondary | ICD-10-CM | POA: Diagnosis present

## 2016-04-27 DIAGNOSIS — G8190 Hemiplegia, unspecified affecting unspecified side: Secondary | ICD-10-CM

## 2016-04-27 DIAGNOSIS — N179 Acute kidney failure, unspecified: Secondary | ICD-10-CM | POA: Diagnosis not present

## 2016-04-27 DIAGNOSIS — K5090 Crohn's disease, unspecified, without complications: Secondary | ICD-10-CM | POA: Diagnosis present

## 2016-04-27 DIAGNOSIS — I4891 Unspecified atrial fibrillation: Secondary | ICD-10-CM | POA: Diagnosis present

## 2016-04-27 DIAGNOSIS — E1165 Type 2 diabetes mellitus with hyperglycemia: Secondary | ICD-10-CM | POA: Diagnosis present

## 2016-04-27 DIAGNOSIS — I679 Cerebrovascular disease, unspecified: Secondary | ICD-10-CM | POA: Diagnosis not present

## 2016-04-27 DIAGNOSIS — R197 Diarrhea, unspecified: Secondary | ICD-10-CM

## 2016-04-27 DIAGNOSIS — N183 Chronic kidney disease, stage 3 unspecified: Secondary | ICD-10-CM

## 2016-04-27 DIAGNOSIS — Z8249 Family history of ischemic heart disease and other diseases of the circulatory system: Secondary | ICD-10-CM

## 2016-04-27 DIAGNOSIS — N390 Urinary tract infection, site not specified: Secondary | ICD-10-CM | POA: Diagnosis present

## 2016-04-27 DIAGNOSIS — I10 Essential (primary) hypertension: Secondary | ICD-10-CM | POA: Diagnosis present

## 2016-04-27 DIAGNOSIS — B9689 Other specified bacterial agents as the cause of diseases classified elsewhere: Secondary | ICD-10-CM | POA: Diagnosis present

## 2016-04-27 DIAGNOSIS — A020 Salmonella enteritis: Secondary | ICD-10-CM | POA: Diagnosis not present

## 2016-04-27 DIAGNOSIS — Z7982 Long term (current) use of aspirin: Secondary | ICD-10-CM

## 2016-04-27 DIAGNOSIS — R32 Unspecified urinary incontinence: Secondary | ICD-10-CM | POA: Diagnosis present

## 2016-04-27 DIAGNOSIS — R7881 Bacteremia: Secondary | ICD-10-CM | POA: Diagnosis not present

## 2016-04-27 DIAGNOSIS — Z79899 Other long term (current) drug therapy: Secondary | ICD-10-CM

## 2016-04-27 DIAGNOSIS — E1122 Type 2 diabetes mellitus with diabetic chronic kidney disease: Secondary | ICD-10-CM | POA: Diagnosis present

## 2016-04-27 DIAGNOSIS — Z23 Encounter for immunization: Secondary | ICD-10-CM | POA: Diagnosis not present

## 2016-04-27 DIAGNOSIS — I340 Nonrheumatic mitral (valve) insufficiency: Secondary | ICD-10-CM | POA: Diagnosis not present

## 2016-04-27 DIAGNOSIS — Z954 Presence of other heart-valve replacement: Secondary | ICD-10-CM | POA: Diagnosis not present

## 2016-04-27 DIAGNOSIS — I69392 Facial weakness following cerebral infarction: Secondary | ICD-10-CM | POA: Diagnosis not present

## 2016-04-27 DIAGNOSIS — I63231 Cerebral infarction due to unspecified occlusion or stenosis of right carotid arteries: Secondary | ICD-10-CM | POA: Diagnosis present

## 2016-04-27 DIAGNOSIS — N3000 Acute cystitis without hematuria: Secondary | ICD-10-CM

## 2016-04-27 NOTE — H&P (Signed)
History and Physical    Evan Stanley MOQ:[id number] DOB: 1941-04-07 DOA: 04/27/2016  Referring MD/NP/PA: Dr. Marily Memos PCP: Wardell Honour, MD  Patient coming from: [facility]  Chief Complaint: Left-sided weakness  HPI: Evan Stanley is a 75 y.o. male with medical history significant of HTN, HLD, DM, A. Fib, CVA, and R. CEA on 12/2015; who presents with complaints of left-sided facial droop and weakness. History is obtained mostly from the patient's wife. Patient had previously had a stroke after a CT angiogram to evaluate his carotids in March of 2017 with left-sided weakness. Patient subsequently underwent a right carotid artery endarterectomy in June. He underwent physical therapy at that time with almost complete resolution of symptoms. However, patient was noted to have acute loss of ability to walk and left-sided facial droop yesterday(10/8) evening. His wife called 5 and he was brought to [facility].  ED Course: Initially admitted overnight at [facility] with complaints of left facial droop and generalized weakness of the lower extremities. Patient was determined to be outside of TPA window with initial CT scan of the head negative for any acute process. Subsequent, MRI result received in evolution of old stroke and new occlusive vascular disease. Patient subsequently developed fever and diarrhea. Lab work from [place] from today revealed WBC 13, hemoglobin 13.5, hematocrit 39.8, platelet count 150, sodium 136, potassium 4.5, chloride 96, CO2 25.7, BUN 23, creatinine 1.39, anion gap 19, glucose 209, hemoglobin A1c 10.3.   Patient was noted to have a unremarkable chest x-ray. Urinalysis was positive for signs of bacteria and the patient was given IV Rocephin prior to transport.  Review of Systems: As per HPI otherwise 10 point review of systems negative.   Past Medical History:  Diagnosis Date  . Anxiety   . Arthritis    Right shoulder  . Atrial fibrillation ([place])     . Carotid artery stenosis   . CHF (congestive heart failure) ([place])   . Decreased vision    left eye  . Diabetes mellitus without complication ([place])    Takes Metformin  . Hyperlipidemia   . Hypertension   . Stroke ([place]) 11/18/2015   no deficits  . TIA (transient ischemic attack)   . Urgency of urination     Past Surgical History:  Procedure Laterality Date  . AORTIC VALVE REPLACEMENT    . BACK SURGERY  80s  . CARDIAC VALVE REPLACEMENT    . ENDARTERECTOMY Right 01/13/2016   Procedure: ENDARTERECTOMY CAROTID - RIGHT;  Surgeon: Conrad Enigma, MD;  Location: [place];  Service: Vascular;  Laterality: Right;  . laser surgery, left eye  Left 10-29-2015    retinal surgery/ Deloria Lair MD   . PERIPHERAL VASCULAR CATHETERIZATION Right 11/18/2015   Procedure: Carotid Angiography;  Surgeon: Conrad Dundee, MD;  Location: [place] CV [facility];  Service: Cardiovascular;  Laterality: Right;  . PERIPHERAL VASCULAR CATHETERIZATION N/A 11/18/2015   Procedure: Aortic Arch Angiography;  Surgeon: Conrad Pioneer, MD;  Location: [place] [facility];  Service: Cardiovascular;  Laterality: N/A;     reports that he quit smoking about 31 years ago. He has never used smokeless tobacco. He reports that he does not drink alcohol or use drugs.  No Known Allergies  Family History  Problem Relation Age of Onset  . Heart disease Mother   . Stroke Father 75    Prior to Admission medications   Medication Sig Start Date End Date Taking? Authorizing Provider  amLODipine (NORVASC) 10 MG tablet Take  1 tablet (10 mg total) by mouth daily. 12/27/15   Wardell Honour, MD  aspirin EC 81 MG tablet Take 1 tablet (81 mg total) by mouth daily. 08/16/15   Belva Crome, MD  atorvastatin (LIPITOR) 40 MG tablet Take 1 tablet (40 mg total) by mouth daily. 04/03/16   Wardell Honour, MD  cetirizine (ZYRTEC) 10 MG tablet Take 10 mg by mouth daily.    Historical Provider, MD  hydrochlorothiazide (HYDRODIURIL) 25 MG tablet Take 1 tablet (25 mg  total) by mouth daily. 12/27/15   Wardell Honour, MD  latanoprost (XALATAN) 0.005 % ophthalmic solution 1 drop at bedtime.    Historical Provider, MD  lisinopril (PRINIVIL,ZESTRIL) 40 MG tablet Take 1 tablet (40 mg total) by mouth daily. 12/27/15   Wardell Honour, MD  metFORMIN (GLUCOPHAGE) 1000 MG tablet Take 1 tablet (1,000 mg total) by mouth 2 (two) times daily with a meal. 12/27/15   Wardell Honour, MD  metoprolol succinate (TOPROL-XL) 100 MG 24 hr tablet Take 1 tablet (100 mg total) by mouth daily. Take with or immediately following a meal. 12/27/15   Wardell Honour, MD  oxyCODONE-acetaminophen (PERCOCET/ROXICET) 5-325 MG tablet Take 1-2 tablets by mouth every 4 (four) hours as needed for moderate pain. 01/14/16   Alvia Grove, PA-C  Pyridoxine HCl (VITAMIN B6 PO) Take by mouth daily.    Historical Provider, MD  vitamin C (ASCORBIC ACID) 500 MG tablet Take 500 mg by mouth daily.    Historical Provider, MD  vitamin E (VITAMIN E) 1000 UNIT capsule Take 1,000 Units by mouth daily.    Historical Provider, MD    Physical Exam:  Constitutional:Elderly male in who appears ill but will intermittently follow commands,   Vitals:   04/27/16 2300  BP: (!) 129/57  Pulse: 81  Resp: 18  Temp: 98.2 F (36.8 C)  TempSrc: Oral  SpO2: 98%   Eyes: PERRL, lids and conjunctivae normal ENMT: Mucous membranes are moist. Posterior pharynx clear of any exudate or lesions.Normal dentition.  Neck: normal, supple, no masses, no thyromegaly Respiratory: clear to auscultation bilaterally, no wheezing, no crackles. Normal respiratory effort. No accessory muscle use.  Cardiovascular: Regular rate and rhythm, no murmurs / rubs / gallops. No extremity edema. 2+ pedal pulses. No carotid bruits.  Abdomen: no tenderness, no masses palpated. No hepatosplenomegaly. Bowel sounds positive.  Musculoskeletal: no clubbing / cyanosis. No joint deformity upper and lower extremities. Good ROM, no contractures. Normal muscle  tone.  Skin: Diaphoretic, no rashes, lesions, ulcers. No induration Neurologic: left-sided facial droop with strength 4/5 bilateral lower extremities and the left upper extremity it seems and 5/5 on the right upper extremity Psychiatric:  Alert and oriented x1. Normal mood.     Labs on Admission: I have personally reviewed following labs and imaging studies  CBC: No results for input(s): WBC, NEUTROABS, HGB, HCT, MCV, PLT in the last 168 hours. Basic Metabolic Panel: No results for input(s): NA, K, CL, CO2, GLUCOSE, BUN, CREATININE, CALCIUM, MG, PHOS in the last 168 hours. GFR: CrCl cannot be calculated (Unknown ideal weight.). Liver Function Tests: No results for input(s): AST, ALT, ALKPHOS, BILITOT, PROT, ALBUMIN in the last 168 hours. No results for input(s): LIPASE, AMYLASE in the last 168 hours. No results for input(s): AMMONIA in the last 168 hours. Coagulation Profile: No results for input(s): INR, PROTIME in the last 168 hours. Cardiac Enzymes: No results for input(s): CKTOTAL, CKMB, CKMBINDEX, TROPONINI in the last 168 hours. BNP (  last 3 results) No results for input(s): PROBNP in the last 8760 hours. HbA1C: No results for input(s): HGBA1C in the last 72 hours. CBG: No results for input(s): GLUCAP in the last 168 hours. Lipid Profile: No results for input(s): CHOL, HDL, LDLCALC, TRIG, CHOLHDL, LDLDIRECT in the last 72 hours. Thyroid Function Tests: No results for input(s): TSH, T4TOTAL, FREET4, T3FREE, THYROIDAB in the last 72 hours. Anemia Panel: No results for input(s): VITAMINB12, FOLATE, FERRITIN, TIBC, IRON, RETICCTPCT in the last 72 hours. Urine analysis:    Component Value Date/Time   COLORURINE YELLOW 01/10/2016 [place] 01/10/2016 1035   LABSPEC 1.018 01/10/2016 1035   PHURINE 6.5 01/10/2016 1035   GLUCOSEU 100 (A) 01/10/2016 1035   HGBUR NEGATIVE 01/10/2016 1035   BILIRUBINUR NEGATIVE 01/10/2016 1035   KETONESUR NEGATIVE 01/10/2016 1035    PROTEINUR NEGATIVE 01/10/2016 1035   UROBILINOGEN >8.0 (H) 10/30/2008 2328   NITRITE NEGATIVE 01/10/2016 1035   LEUKOCYTESUR NEGATIVE 01/10/2016 1035   Sepsis Labs: No results found for this or any previous visit (from the past 240 hour(s)).   Radiological Exams on Admission: No results found.   Assessment/Plan Left-sided hemiparesis/ history of CVA: Patient with recurrence of left-sided weakness and symptoms of previous stroke. Evaluated by neurology who suspected recrudescence of prior deficits. Recommended rehabilitation services as needed.  - Admit to telemetry bed - PT/OT to eval and treat - Appreciate Neurology consultation   Urinary tract infection - Follow-up with [facility] regarding urine culture results - Continue IV Rocephin  Fever - Tylenol prn -  May warrant broadening of antibiotic coverage if fevers persist  Diarrhea - Check C. Difficile - Monitor ins and outs   Question a acute renal failure/CKD stage III - Recheck BMP in a.m. - IV fluids NS at 100 ml/hr  Diabetes mellitus type 2, uncontrolled: Hemoglobin A1c at outside facility noted to be 10.3 - Held the metformin  - Hypoglycemic protocols - Check CBGs every before meals and at bedtime with sensitive sliding scale insulin  Essential Hypertension - Continue amlodipine, - Hold lisinopril and HCTZ  DVT prophylaxis: Lovenox  Code Status: Full  Family Communication: Discussed overall care with the patient and his wife present at bedside Disposition Plan: TBD Consults called: Neurology Admission status: Inpatient telemetry  Norval Morton MD [facility] Pager [phone]  If 7PM-7AM, please contact night-coverage [email or web address] Password [email or web address]  04/27/2016, 11:21 PM

## 2016-04-28 ENCOUNTER — Encounter (HOSPITAL_COMMUNITY): Payer: Self-pay

## 2016-04-28 DIAGNOSIS — I639 Cerebral infarction, unspecified: Secondary | ICD-10-CM

## 2016-04-28 DIAGNOSIS — N390 Urinary tract infection, site not specified: Secondary | ICD-10-CM | POA: Diagnosis present

## 2016-04-28 DIAGNOSIS — R197 Diarrhea, unspecified: Secondary | ICD-10-CM | POA: Diagnosis present

## 2016-04-28 DIAGNOSIS — R531 Weakness: Secondary | ICD-10-CM

## 2016-04-28 DIAGNOSIS — G8190 Hemiplegia, unspecified affecting unspecified side: Secondary | ICD-10-CM

## 2016-04-28 DIAGNOSIS — N179 Acute kidney failure, unspecified: Secondary | ICD-10-CM | POA: Insufficient documentation

## 2016-04-28 HISTORY — DX: Cerebral infarction, unspecified: I63.9

## 2016-04-28 LAB — CBC WITH DIFFERENTIAL/PLATELET
BASOS PCT: 0 %
Basophils Absolute: 0 10*3/uL (ref 0.0–0.1)
EOS PCT: 0 %
Eosinophils Absolute: 0 10*3/uL (ref 0.0–0.7)
HEMATOCRIT: 40.2 % (ref 39.0–52.0)
HEMOGLOBIN: 13.5 g/dL (ref 13.0–17.0)
Lymphocytes Relative: 11 %
Lymphs Abs: 0.6 10*3/uL — ABNORMAL LOW (ref 0.7–4.0)
MCH: 28.1 pg (ref 26.0–34.0)
MCHC: 33.6 g/dL (ref 30.0–36.0)
MCV: 83.8 fL (ref 78.0–100.0)
MONOS PCT: 7 %
Monocytes Absolute: 0.4 10*3/uL (ref 0.1–1.0)
NEUTROS PCT: 82 %
Neutro Abs: 4.9 10*3/uL (ref 1.7–7.7)
Platelets: 143 10*3/uL — ABNORMAL LOW (ref 150–400)
RBC: 4.8 MIL/uL (ref 4.22–5.81)
RDW: 13.8 % (ref 11.5–15.5)
WBC MORPHOLOGY: INCREASED
WBC: 5.9 10*3/uL (ref 4.0–10.5)

## 2016-04-28 LAB — COMPREHENSIVE METABOLIC PANEL
ALT: 34 U/L (ref 17–63)
AST: 38 U/L (ref 15–41)
Albumin: 3.3 g/dL — ABNORMAL LOW (ref 3.5–5.0)
Alkaline Phosphatase: 42 U/L (ref 38–126)
Anion gap: 14 (ref 5–15)
BUN: 32 mg/dL — ABNORMAL HIGH (ref 6–20)
CHLORIDE: 101 mmol/L (ref 101–111)
CO2: 22 mmol/L (ref 22–32)
Calcium: 8.9 mg/dL (ref 8.9–10.3)
Creatinine, Ser: 1.72 mg/dL — ABNORMAL HIGH (ref 0.61–1.24)
GFR, EST AFRICAN AMERICAN: 44 mL/min — AB (ref >60)
GFR, EST NON AFRICAN AMERICAN: 38 mL/min — AB (ref >60)
Glucose, Bld: 225 mg/dL — ABNORMAL HIGH (ref 65–99)
POTASSIUM: 3.5 mmol/L (ref 3.5–5.1)
SODIUM: 137 mmol/L (ref 135–145)
Total Bilirubin: 1.2 mg/dL (ref 0.3–1.2)
Total Protein: 6.2 g/dL — ABNORMAL LOW (ref 6.5–8.1)

## 2016-04-28 LAB — GLUCOSE, CAPILLARY
GLUCOSE-CAPILLARY: 233 mg/dL — AB (ref 65–99)
GLUCOSE-CAPILLARY: 260 mg/dL — AB (ref 65–99)
GLUCOSE-CAPILLARY: 216 mg/dL — AB (ref 65–99)
GLUCOSE-CAPILLARY: 193 mg/dL — AB (ref 65–99)
Glucose-Capillary: 225 mg/dL — ABNORMAL HIGH (ref 65–99)

## 2016-04-28 LAB — C DIFFICILE QUICK SCREEN W PCR REFLEX
C DIFFICILE (CDIFF) INTERP: NOT DETECTED
C Diff antigen: NEGATIVE
C Diff toxin: NEGATIVE

## 2016-04-28 LAB — LACTIC ACID, PLASMA
LACTIC ACID, VENOUS: 1.6 mmol/L (ref 0.5–1.9)
Lactic Acid, Venous: 1.4 mmol/L (ref 0.5–1.9)

## 2016-04-28 MED ORDER — INSULIN ASPART 100 UNIT/ML ~~LOC~~ SOLN
0.0000 [IU] | Freq: Three times a day (TID) | SUBCUTANEOUS | Status: DC
  Administered 2016-04-28: 3 [IU] via SUBCUTANEOUS
  Administered 2016-04-28: 5 [IU] via SUBCUTANEOUS
  Administered 2016-04-28 – 2016-04-30 (×6): 3 [IU] via SUBCUTANEOUS
  Administered 2016-05-01 (×2): 2 [IU] via SUBCUTANEOUS
  Administered 2016-05-01: 1 [IU] via SUBCUTANEOUS
  Administered 2016-05-02 – 2016-05-03 (×3): 2 [IU] via SUBCUTANEOUS
  Administered 2016-05-03: 1 [IU] via SUBCUTANEOUS
  Administered 2016-05-03 – 2016-05-04 (×2): 2 [IU] via SUBCUTANEOUS
  Administered 2016-05-04 (×2): 3 [IU] via SUBCUTANEOUS
  Administered 2016-05-05 (×2): 2 [IU] via SUBCUTANEOUS

## 2016-04-28 MED ORDER — LOPERAMIDE HCL 2 MG PO CAPS
2.0000 mg | ORAL_CAPSULE | ORAL | Status: DC | PRN
  Administered 2016-04-28: 2 mg via ORAL
  Filled 2016-04-28: qty 1

## 2016-04-28 MED ORDER — ACETAMINOPHEN 325 MG PO TABS
650.0000 mg | ORAL_TABLET | Freq: Four times a day (QID) | ORAL | Status: DC | PRN
  Administered 2016-04-28 – 2016-04-30 (×2): 650 mg via ORAL
  Filled 2016-04-28 (×5): qty 2

## 2016-04-28 MED ORDER — AMLODIPINE BESYLATE 10 MG PO TABS
10.0000 mg | ORAL_TABLET | Freq: Every day | ORAL | Status: DC
  Administered 2016-04-28 – 2016-05-05 (×7): 10 mg via ORAL
  Filled 2016-04-28 (×7): qty 1

## 2016-04-28 MED ORDER — VITAMIN B6 250 MG PO TABS: 1.0000 | ORAL_TABLET | Freq: Every day | ORAL | Status: DC

## 2016-04-28 MED ORDER — DEXTROSE 5 % IV SOLN
2.0000 g | INTRAVENOUS | Status: DC
  Administered 2016-04-29 – 2016-05-05 (×7): 2 g via INTRAVENOUS
  Filled 2016-04-28 (×8): qty 2

## 2016-04-28 MED ORDER — SODIUM CHLORIDE 0.9 % IV SOLN
INTRAVENOUS | Status: DC
  Administered 2016-04-28: 02:00:00 via INTRAVENOUS

## 2016-04-28 MED ORDER — ATORVASTATIN CALCIUM 40 MG PO TABS
40.0000 mg | ORAL_TABLET | Freq: Every day | ORAL | Status: DC
  Administered 2016-04-28 – 2016-05-05 (×7): 40 mg via ORAL
  Filled 2016-04-28 (×7): qty 1

## 2016-04-28 MED ORDER — LORATADINE 10 MG PO TABS
10.0000 mg | ORAL_TABLET | Freq: Every day | ORAL | Status: DC
Start: 2016-04-28 — End: 2016-05-05
  Administered 2016-04-28 – 2016-05-05 (×7): 10 mg via ORAL
  Filled 2016-04-28 (×7): qty 1

## 2016-04-28 MED ORDER — INSULIN GLARGINE 100 UNIT/ML ~~LOC~~ SOLN
8.0000 [IU] | Freq: Every day | SUBCUTANEOUS | Status: DC
  Administered 2016-04-28: 8 [IU] via SUBCUTANEOUS
  Filled 2016-04-28 (×2): qty 0.08

## 2016-04-28 MED ORDER — DEXTROSE 5 % IV SOLN
1.0000 g | INTRAVENOUS | Status: DC
  Administered 2016-04-28: 1 g via INTRAVENOUS
  Filled 2016-04-28: qty 10

## 2016-04-28 MED ORDER — ALBUTEROL SULFATE (2.5 MG/3ML) 0.083% IN NEBU: 2.5000 mg | INHALATION_SOLUTION | RESPIRATORY_TRACT | Status: DC | PRN

## 2016-04-28 MED ORDER — SODIUM CHLORIDE 0.9 % IV SOLN
INTRAVENOUS | Status: DC
  Administered 2016-04-28 – 2016-04-30 (×3): via INTRAVENOUS

## 2016-04-28 MED ORDER — ASPIRIN EC 81 MG PO TBEC
81.0000 mg | DELAYED_RELEASE_TABLET | Freq: Every day | ORAL | Status: DC
  Administered 2016-04-28 – 2016-05-05 (×7): 81 mg via ORAL
  Filled 2016-04-28 (×7): qty 1

## 2016-04-28 MED ORDER — ONDANSETRON HCL 4 MG PO TABS
4.0000 mg | ORAL_TABLET | Freq: Four times a day (QID) | ORAL | Status: DC | PRN
  Administered 2016-05-02 – 2016-05-03 (×3): 4 mg via ORAL
  Filled 2016-04-28 (×4): qty 1

## 2016-04-28 MED ORDER — VITAMIN C 500 MG PO TABS
500.0000 mg | ORAL_TABLET | Freq: Every day | ORAL | Status: DC
  Administered 2016-04-28 – 2016-05-05 (×7): 500 mg via ORAL
  Filled 2016-04-28 (×7): qty 1

## 2016-04-28 MED ORDER — DEXTROSE 5 % IV SOLN: 2.0000 g | INTRAVENOUS | Status: DC

## 2016-04-28 MED ORDER — ACETAMINOPHEN 650 MG RE SUPP
650.0000 mg | Freq: Four times a day (QID) | RECTAL | Status: DC | PRN
  Filled 2016-04-28: qty 1

## 2016-04-28 MED ORDER — INSULIN ASPART 100 UNIT/ML ~~LOC~~ SOLN
0.0000 [IU] | Freq: Every day | SUBCUTANEOUS | Status: DC
  Administered 2016-04-28: 2 [IU] via SUBCUTANEOUS

## 2016-04-28 MED ORDER — VITAMIN B-6 50 MG PO TABS
250.0000 mg | ORAL_TABLET | Freq: Every day | ORAL | Status: DC
  Administered 2016-04-28 – 2016-05-05 (×7): 250 mg via ORAL
  Filled 2016-04-28 (×8): qty 1

## 2016-04-28 MED ORDER — ENOXAPARIN SODIUM 40 MG/0.4ML ~~LOC~~ SOLN
40.0000 mg | SUBCUTANEOUS | Status: DC
  Administered 2016-04-28 – 2016-05-05 (×7): 40 mg via SUBCUTANEOUS
  Filled 2016-04-28 (×7): qty 0.4

## 2016-04-28 MED ORDER — ONDANSETRON HCL 4 MG/2ML IJ SOLN
4.0000 mg | Freq: Four times a day (QID) | INTRAMUSCULAR | Status: DC | PRN
  Administered 2016-04-29 – 2016-05-01 (×3): 4 mg via INTRAVENOUS
  Filled 2016-04-28 (×4): qty 2

## 2016-04-28 MED ORDER — METOPROLOL SUCCINATE ER 100 MG PO TB24
100.0000 mg | ORAL_TABLET | Freq: Every day | ORAL | Status: DC
Start: 2016-04-28 — End: 2016-05-05
  Administered 2016-04-28 – 2016-05-05 (×7): 100 mg via ORAL
  Filled 2016-04-28 (×7): qty 1

## 2016-04-28 NOTE — Consult Note (Signed)
Neurology Consult Note  Reason for Consultation: Aphasia, L facial droop  Requesting provider: Fuller Plan, MD  CC: Weak all over  HPI: This is a 73-yo RH man who was transferred from [place] for evaluation of reported aphasia and facial droop. History is obtained from the patient, his wife, and review of records from the OSH that accompanied him in transfer.   He has a history of R hemisphere stroke in May 2017 that reportedly occurred during a carotid angiogram for evaluation of RICA stenosis. His wife reports that after the procedure he had to remain supine for four hours. When he tried to get up after this, he was unable to support his weight and could not walk. He was then noted to have left hemiparesis and workup revealed his stroke. His wife states that he had a good recovery such that he has been able to return to playing golf with only mild residual weakness of the L leg. He was well until the evening of 04/26/16 when his wife reports that he suddenly became unable to walk again, similar to what he experienced with his prior stroke. This was associated with incontinence of urine and stool. His wife called 23 as she was afraid he had another stroke. Per notes, he was also reportedly unable to speak but this resolved in the ambulance. He was taken to the [facility] ED where [facility] showed no acute stroke. He was noted to be febrile with UA concerning for possible UTI. He was admitted and placed on antibiotics.   Pertinent labs from the OSH: BUN 23; creatinine 1.39; glucose 209; wbc 13.0 with left shift; hemoglobin a1c 10.3;   CTH from OSH 04/26/16: "stable moderate generalized atrophy with prior infarct in the superior posterior right centrum semiovale and nearby right frontal lobe gray-white junction; mild periventricular small vessel disease; no acute infarct evident; no hemorrhage, mass, or extra-axial fluid collection; mild paranasal sinus disease; foci of arterial  vascular calcification noted; apparent small lipoma in the anterior right parotid gland stable"  MRI brain without contrast 04/27/16: "No MRI evidence of acute or early subacute ischemia; interval evolution of previously identified infarcts seen on prior MRI from 11/19/15; minimal residual diffusion abnormality persists within the periventricular white matter of the right parietal lobe; stable atrophy with chronic microvascular ischemic disease; abnormal flow void within the LICA, compatible with occlusion; stable from prior; possible short-segment occlusion of the distal right V4 segment, stable as well."   He was transferred to [facility] due to concerns about a possible new stroke and because family requested transfer. The patient currently states that he feels weak all over and does not appreciate his left side to be particularly worse. He complains of some mild abdominal discomfort and diarrhea, no new neurologic concerns.   PMH:  Past Medical History:  Diagnosis Date  . Anxiety   . Arthritis    Right shoulder  . Atrial fibrillation ([place])   . Carotid artery stenosis   . CHF (congestive heart failure) ([place])   . Decreased vision    left eye  . Diabetes mellitus without complication ([place])    Takes Metformin  . Hyperlipidemia   . Hypertension   . Stroke ([place]) 11/18/2015   no deficits  . TIA (transient ischemic attack)   . Urgency of urination     PSH:  Past Surgical History:  Procedure Laterality Date  . AORTIC VALVE REPLACEMENT    . BACK SURGERY  80s  . CARDIAC VALVE REPLACEMENT    .  ENDARTERECTOMY Right 01/13/2016   Procedure: ENDARTERECTOMY CAROTID - RIGHT;  Surgeon: Conrad Schellsburg, MD;  Location: [place];  Service: Vascular;  Laterality: Right;  . laser surgery, left eye  Left 10-29-2015    retinal surgery/ Deloria Lair MD   . PERIPHERAL VASCULAR CATHETERIZATION Right 11/18/2015   Procedure: Carotid Angiography;  Surgeon: Conrad Emmons, MD;  Location: [place] [facility];  Service:  Cardiovascular;  Laterality: Right;  . PERIPHERAL VASCULAR CATHETERIZATION N/A 11/18/2015   Procedure: Aortic Arch Angiography;  Surgeon: Conrad , MD;  Location: [place] [facility];  Service: Cardiovascular;  Laterality: N/A;    Family history: Family History  Problem Relation Age of Onset  . Heart disease Mother   . Stroke Father 9    Social history:  Social History   Social History  . Marital status: Married    Spouse name: N/A  . Number of children: N/A  . Years of education: N/A   Occupational History  . Not on file.   Social History Main Topics  . Smoking status: Former Smoker    Quit date: 07/20/1984  . Smokeless tobacco: Never Used  . Alcohol use No  . Drug use: No  . Sexual activity: Not on file   Other Topics Concern  . Not on file   Social History Narrative  . No narrative on file    Current inpatient meds:  Current Facility-Administered Medications  Medication Dose Route Frequency Provider Last Rate Last Dose  . 0.9 %  sodium chloride infusion   Intravenous Continuous Norval Morton, MD      . acetaminophen (TYLENOL) tablet 650 mg  650 mg Oral Q6H PRN Norval Morton, MD       Or  . acetaminophen (TYLENOL) suppository 650 mg  650 mg Rectal Q6H PRN Norval Morton, MD      . albuterol (PROVENTIL) (2.5 MG/3ML) 0.083% nebulizer solution 2.5 mg  2.5 mg Nebulization Q2H PRN Norval Morton, MD      . amLODipine (NORVASC) tablet 10 mg  10 mg Oral Daily Norval Morton, MD      . aspirin EC tablet 81 mg  81 mg Oral Daily Rondell A [name], MD      . atorvastatin (LIPITOR) tablet 40 mg  40 mg Oral Daily Rondell A [name], MD      . cefTRIAXone (ROCEPHIN) 1 g in dextrose 5 % 50 mL IVPB  1 g Intravenous Q24H Rondell A Mccauley, MD      . enoxaparin (LOVENOX) injection 40 mg  40 mg Subcutaneous Q24H Rondell A Germano, MD      . insulin aspart (novoLOG) injection 0-5 Units  0-5 Units Subcutaneous QHS Rondell A Presas, MD      . insulin aspart (novoLOG) injection 0-9 Units   0-9 Units Subcutaneous TID WC Rondell A [name], MD      . loratadine (CLARITIN) tablet 10 mg  10 mg Oral Daily Rondell A [name], MD      . metoprolol succinate (TOPROL-XL) 24 hr tablet 100 mg  100 mg Oral Daily Rondell A [name], MD      . ondansetron (ZOFRAN) tablet 4 mg  4 mg Oral Q6H PRN Norval Morton, MD       Or  . ondansetron (ZOFRAN) injection 4 mg  4 mg Intravenous Q6H PRN Rondell A Gillock, MD      . pyridOXINE (VITAMIN B-6) tablet 250 mg  250 mg Oral Daily Rondell A Peery,  MD      . vitamin C (ASCORBIC ACID) tablet 500 mg  500 mg Oral Daily Rondell A [name], MD        Allergies: No Known Allergies  ROS: As per HPI. A full 14-point review of systems was performed and is otherwise unremarkable.  PE:  BP (!) 129/57 (BP Location: Right Arm)   Pulse 81   Temp 98.2 F (36.8 C) (Oral)   Resp 18   SpO2 98%   General: WDWN, appears ill. He is initially resting with his eyes closed but opens them to voice. He is oriented to self, Zac[facility] Pontes, and 2017. He is unable tell me the month or the date. He told me that it was Monday even though it is just past midnight and is now Tuesday. Effort fluctuates of course the evaluation.  HEENT: Normocephalic. Mildly diaphoretic. Neck supple without LAD. MMM, OP clear. Dentition good. Sclerae anicteric. No conjunctival injection.  CV: Regular, no murmur. Carotid pulses full and symmetric, no bruits. Distal pulses 2+ and symmetric.  Lungs: CTAB.  Abdomen: Soft, obese , non-distended. He has mild diffuse tenderness to palpation, no guarding. Bowel sounds present x4.  Extremities: No C/C/E. Neuro:  CN: Pupils are equal and round. They are symmetrically reactive from 3-->2 mm. EOMI without nystagmus. No reported diplopia. Facial sensation is intact to light touch. Face is notable for some possible flattening of the left nasolabial fold with normal strength and mobility. Hearing is intact to conversational voice. Palate elevates symmetrically and uvula is  midline. Voice is normal in tone, pitch and quality. Bilateral SCM and trapezii are 5/5. Tongue is midline with normal bulk and mobility.  Motor: Normal bulk and tone. Effort varies with confrontational strength testing but with encouragement, he appears to be 5/5 in the upper extremities with the possible exception of slight weakness with left triceps. In the lower extremities, he is approximately 4/5 on the right, 4-/5 on the left but again this is highly effort dependent.  No tremor or other abnormal movements.   Sensation: Intact to light touch. Coordination: Finger-to-nose and heel-to-shin are limited by effort. Finger taps are slow bilaterally, worse on the left.   Labs:  Lab Results  Component Value Date   WBC 9.0 01/14/2016   HGB 10.8 (L) 01/14/2016   HCT 32.7 (L) 01/14/2016   PLT 160 01/14/2016   GLUCOSE 148 (H) 01/14/2016   CHOL 176 09/20/2015   TRIG 147 09/20/2015   HDL 37 (L) 09/20/2015   LDLCALC 110 (H) 09/20/2015   ALT 29 01/10/2016   AST 25 01/10/2016   NA 140 01/14/2016   K 3.9 01/14/2016   CL 106 01/14/2016   CREATININE 1.34 (H) 01/14/2016   BUN 14 01/14/2016   CO2 26 01/14/2016   TSH 1.890 06/14/2013   PSA 0.9 06/14/2013   INR 1.02 01/10/2016   HGBA1C 7.2 (H) 01/10/2016   MICROALBUR neg 11/28/2014    Other diagnostic studies:  Transthoracic echocardiogram from 5/E/17 showed posterior basal hypokinesis with ejection fraction 55%; normal-appearing bioprosthetic aortic valve; calcified mitral annulus with mildly thickened mitral leaflets; mild dilation of the left atrium; no atrial septal defect or PFO identified  Assessment and Plan:  1. Left hemiparesis: This appears to be recrudescence of prior deficits from a stroke in May 2017. This is in the setting of an acute febrile illness. MRI scan of the brain did not reveal any acute ischemic changes to suggest a new infarction. At this point, management hinges on treatment  of the acute illness with rehabilitation  services as needed.  2. Generalized weakness: This is in the setting of an acute febrile illness. Rehabilitation services as needed.  3. Cerebrovascular disease: He has history of recent stroke in the setting of cerebral angiography in May 2017. He underwent right carotid endarterectomy on 01/13/2016. He has known chronic occlusion of the left internal carotid artery. Additional risk factors for stroke include diabetes, hyperlipidemia, hypertension, and atrial fibrillation. He was taking aspirin 81 mg daily prior to this presentation and this can be continued. Continue atorvastatin. Continue with aggressive control of risk factors.  This was discussed with the patient and his wife. They're in agreement with the plan as stated. There are given the opportunity to ask any questions and these were addressed to their satisfaction.  I also briefly discussed my findings with Dr. [name] following my consultation.  Thank you for the opportunity to participate with this patient's care. I have no additional recommendations at this time and will sign off. Please feel free to call if there are any new issues that should arise.

## 2016-04-28 NOTE — Progress Notes (Signed)
Pt's foley catheter removed at 1001 this morning. Patient has not voided since. Patient bladder scanned, 250 mls retained.   Ermalinda Memos, RN

## 2016-04-28 NOTE — Progress Notes (Signed)
Foley removed per MD order. Pt tolerated well.

## 2016-04-28 NOTE — Evaluation (Signed)
Physical Therapy Evaluation Patient Details Name: Evan Stanley MRN: [id number] DOB: Jun 15, 1941 Today's Date: 04/28/2016   History of Present Illness  This is a 13-yo RH man with a medical history significant of HTN, HLD, DM, A. Fib, CVA, and R. CEA who was transferred from [place] for evaluation of reported aphasia and facial droop. Found to have a fever and diarrhea, likely UTI; Left hemiparesis appears to be recrudescence of prior deficits from a stroke in May 2017. This is in the setting of an acute febrile illness. MRI scan of the brain did not reveal any acute ischemic changes to suggest a new infarction.  Clinical Impression   Pt admitted with above diagnosis. Pt currently with functional limitations due to the deficits listed below (see PT Problem List). I anticipate good progress with functional mobility as his medical status improves;  Pt will benefit from skilled PT to increase their independence and safety with mobility to allow discharge to the venue listed below.       Follow Up Recommendations Home health PT;Supervision - Intermittent (hopeful that he will progress well and may not need HHPT)    Equipment Recommendations  3in1 (PT)    Recommendations for Other Services       Precautions / Restrictions Precautions Precautions: Fall      Mobility  Bed Mobility Overal bed mobility: Needs Assistance Bed Mobility: Rolling;Sidelying to Sit Rolling: Supervision Sidelying to sit: Min guard       General bed mobility comments: Cues to initiate; Used rails, overall moving well to get to EOB  Transfers Overall transfer level: Needs assistance Equipment used: Rolling walker (2 wheeled) Transfers: Sit to/from Stand Sit to Stand: Min guard         General transfer comment: Minguard for safety; cues for hand placement  Ambulation/Gait Ambulation/Gait assistance: Min guard Ambulation Distance (Feet):  (Sidesteps to [facility]) Assistive device:  Rolling walker (2 wheeled)       General Gait Details: Good use of RW for stability; Limited amb due to diarrhea  Stairs            Wheelchair Mobility    Modified Rankin (Stroke Patients Only)       Balance                                             Pertinent Vitals/Pain Pain Assessment: No/denies pain    Home Living Family/patient expects to be discharged to:: Private residence Living Arrangements: Spouse/significant other Available Help at Discharge: Available PRN/intermittently (works days) Type of Home: House Home Access: Stairs to enter Entrance Stairs-Rails: None Technical brewer of Steps: 1 Home Layout: Laundry or work area in basement;One level Home Equipment: Walker - 2 wheels      Prior Function Level of Independence: Independent               Hand Dominance   Dominant Hand: Right    Extremity/Trunk Assessment   Upper Extremity Assessment: Generalized weakness           Lower Extremity Assessment: Generalized weakness         Communication   Communication: No difficulties  Cognition Arousal/Alertness: Awake/alert Behavior During Therapy: WFL for tasks assessed/performed Overall Cognitive Status: Impaired/Different from baseline                 General Comments: Slower t answer question  General Comments General comments (skin integrity, edema, etc.): Wife present and very nervous throughout session; many questions re: signs and symptoms of UTI, why he presented with stroke-like symptoms    Exercises     Assessment/Plan    PT Assessment Patient needs continued PT services  PT Problem List Decreased strength;Decreased activity tolerance;Decreased balance;Decreased mobility;Decreased knowledge of use of DME          PT Treatment Interventions DME instruction;Gait training;Stair training;Functional mobility training;Therapeutic activities;Therapeutic exercise;Patient/family education     PT Goals (Current goals can be found in the Care Plan section)  Acute Rehab PT Goals Patient Stated Goal: feel better PT Goal Formulation: With patient Time For Goal Achievement: 05/12/16 Potential to Achieve Goals: Good    Frequency Min 3X/week   Barriers to discharge        Co-evaluation               End of Session Equipment Utilized During Treatment: Gait belt Activity Tolerance: Patient tolerated treatment well Patient left: in bed;with call bell/phone within reach (bed in semi-chair position) Nurse Communication: Mobility status         Time: 1110-1135 PT Time Calculation (min) (ACUTE ONLY): 25 min   Charges:   PT Evaluation $PT Eval Moderate Complexity: 1 Procedure PT Treatments $Therapeutic Activity: 8-22 mins   PT G CodesQuin Hoop 04/28/2016, 4:14 PM  [name], [place] Pager [phone] Office [phone]

## 2016-04-28 NOTE — Progress Notes (Addendum)
PROGRESS NOTE        PATIENT DETAILS Name: Evan Stanley Age: 75 y.o. Sex: male Date of Birth: December 25, 1940 Admit Date: 04/27/2016 Admitting Physician Norval Morton, MD KGY:[id number], Lillette Boxer, MD  Brief Narrative: Patient is a 75 y.o. male with medical history significant of HTN, HLD, DM, A. Fib, CVA, and R. CEA on 12/2015 presented to [facility] on 10/9 with generalized weakness and inability to ambulate. Patient was [facility] where CT scan of the head was negative, he was also found to be febrile-patient was admitted-following which MRI brain was negative for acute CVA. Family requested transfer to [facility] for further evaluation and treatment. Upon further evaluation, he now has gram-negative bacteremia, likely causing generalized weakness. See below for further details  Subjective: Looks weak Has developed worsening diarrhea overnight  Denies chest pain Denies vomiting Denies abdominal pain  Assessment/Plan: Systemic inflammatory response syndrome secondary to gram-negative bacteremia: Low-grade fever overnight, blood cultures drawn at [facility] on 10/9 positive for gram-negative rods (called micro-lab at [facility] today)-final culture sensitivity results should be available 10/11. Blood cultures done here on 10/10 are currently pending. For now change Rocephin to 2 g daily and follow clinical course. Could have bacteremia due to bacterial translocation from diarrhea, abd exam is benign-LFT's within normal limits-therefore doubt biliary etiology. Will continue to follow  ? UTI: On Rocephin that should cover-UA done at [facility] somewhat equivocal-urine culture done at [facility] negative (called micro-lab and confirmed)   Generalized weakness-with transient worsening of left hemiparesis: Apparently had transient facial droop and left hemiparesis prior to presenting at [facility]  similar to stroke symptoms that the patient has had in the past. His weakness has resolved, MRI brain negative for acute CVA. Per neurology, this was likely a recrudescence of prior deficits from a stroke in May 2017 due to acute febrile illness. No need for further workup, will obtain PT evaluation and continue to treat underlying bacteremia.  Acute kidney injury: Secondary to diarrhea, start IV fluids and follow electrolytes.  Diarrhea: Ongoing and worsening overnight-C. difficile PCR negative. Suspect could be related to ongoing bacteremia, as needed Imodium ordered-place flexiseal-hopefully it will improve with treatment of underlying bacteremia.   Recent history of CVA-status post right carotid endarterectomy: Continue aspirin and statin. Nonfocal exam, has generalized weakness.  Non-insulin-dependent  diabetes: A1c 10.3 at outside hospital, CBGs on the higher side today with SSI-add 8 units of Lantus and follow CBGs. Continue to hold metformin  Hypertension: Controlled, continue amlodipine and metoprolol. Continue to hold lisinopril and HCTZ given acute kidney injury and diarrhea.  DVT Prophylaxis: Prophylactic Lovenox   Code Status: Full code   Family Communication: Spouse at bedside  Disposition Plan: Remain inpatient-but will plan on Home health vs SNF on discharge  Antimicrobial agents: See below  Procedures: None  CONSULTS:  None  Time spent: 25 minutes-Greater than 50% of this time was spent in counseling, explanation of diagnosis, planning of further management, and coordination of care.  MEDICATIONS: Anti-infectives    Start     Dose/Rate Route Frequency Ordered Stop   04/29/16 0200  cefTRIAXone (ROCEPHIN) 2 g in dextrose 5 % 50 mL IVPB     2 g 100 mL/hr over 30 Minutes Intravenous Every 24 hours 04/28/16 1152     04/29/16 0045  cefTRIAXone (ROCEPHIN)  2 g in dextrose 5 % 50 mL IVPB  Status:  Discontinued     2 g 100 mL/hr over 30 Minutes Intravenous Every 24  hours 04/28/16 1151 04/28/16 1152   04/28/16 0045  cefTRIAXone (ROCEPHIN) 1 g in dextrose 5 % 50 mL IVPB  Status:  Discontinued     1 g 100 mL/hr over 30 Minutes Intravenous Every 24 hours 04/28/16 0027 04/28/16 1151      Scheduled Meds: . amLODipine  10 mg Oral Daily  . aspirin EC  81 mg Oral Daily  . atorvastatin  40 mg Oral Daily  . [START ON 04/29/2016] cefTRIAXone (ROCEPHIN)  IV  2 g Intravenous Q24H  . enoxaparin (LOVENOX) injection  40 mg Subcutaneous Q24H  . insulin aspart  0-5 Units Subcutaneous QHS  . insulin aspart  0-9 Units Subcutaneous TID WC  . loratadine  10 mg Oral Daily  . metoprolol succinate  100 mg Oral Daily  . vitamin B-6  250 mg Oral Daily  . vitamin C  500 mg Oral Daily   Continuous Infusions: . sodium chloride     PRN Meds:.acetaminophen **OR** acetaminophen, albuterol, loperamide, ondansetron **OR** ondansetron (ZOFRAN) IV   PHYSICAL EXAM: Vital signs: Vitals:   04/27/16 2300 04/28/16 0455 04/28/16 0941  BP: (!) 129/57 128/64 120/67  Pulse: 81 93 91  Resp: 18 18   Temp: 98.2 F (36.8 C) (!) 100.6 F (38.1 C) 99.1 F (37.3 C)  TempSrc: Oral Oral Oral  SpO2: 98% 96% 97%   There were no vitals filed for this visit. There is no height or weight on file to calculate BMI.   General appearance :Awake, alert, not in any distress. Speech Clear. Not toxic LookingBut looks chronically ill appearing Eyes:, pupils equally reactive to light and accomodation,no scleral icterus.Pink conjunctiva HEENT: Atraumatic and Normocephalic Neck: supple, no JVD. No cervical lymphadenopathy. No thyromegaly Resp:Good air entry bilaterally, no added sounds  CVS: S1 S2 regular, no murmurs.  GI: Bowel sounds present, Non tender and not distended with no gaurding, rigidity or rebound.No organomegaly Extremities: B/L Lower Ext shows no edema, both legs are warm to touch Neurology:  speech clear,Non focal, sensation is grossly intact. Psychiatric: Normal judgment and  insight. Alert and oriented x 3. Normal mood. Musculoskeletal:No digital cyanosis Skin:No Rash, warm and dry Wounds:N/A  I have personally reviewed following labs and imaging studies  LABORATORY DATA: CBC:  Recent Labs Lab 04/28/16 0030  WBC 5.9  NEUTROABS 4.9  HGB 13.5  HCT 40.2  MCV 83.8  PLT 143*    Basic Metabolic Panel:  Recent Labs Lab 04/28/16 0030  NA 137  K 3.5  CL 101  CO2 22  GLUCOSE 225*  BUN 32*  CREATININE 1.72*  CALCIUM 8.9    GFR: CrCl cannot be calculated (Unknown ideal weight.).  Liver Function Tests:  Recent Labs Lab 04/28/16 0030  AST 38  ALT 34  ALKPHOS 42  BILITOT 1.2  PROT 6.2*  ALBUMIN 3.3*   No results for input(s): LIPASE, AMYLASE in the last 168 hours. No results for input(s): AMMONIA in the last 168 hours.  Coagulation Profile: No results for input(s): INR, PROTIME in the last 168 hours.  Cardiac Enzymes: No results for input(s): CKTOTAL, CKMB, CKMBINDEX, TROPONINI in the last 168 hours.  BNP (last 3 results) No results for input(s): PROBNP in the last 8760 hours.  HbA1C: No results for input(s): HGBA1C in the last 72 hours.  CBG:  Recent Labs Lab 04/28/16 0152 04/28/16  0810  GLUCAP 225* 233*    Lipid Profile: No results for input(s): CHOL, HDL, LDLCALC, TRIG, CHOLHDL, LDLDIRECT in the last 72 hours.  Thyroid Function Tests: No results for input(s): TSH, T4TOTAL, FREET4, T3FREE, THYROIDAB in the last 72 hours.  Anemia Panel: No results for input(s): VITAMINB12, FOLATE, FERRITIN, TIBC, IRON, RETICCTPCT in the last 72 hours.  Urine analysis:    Component Value Date/Time   COLORURINE YELLOW 01/10/2016 [place] 01/10/2016 1035   LABSPEC 1.018 01/10/2016 1035   PHURINE 6.5 01/10/2016 1035   GLUCOSEU 100 (A) 01/10/2016 1035   HGBUR NEGATIVE 01/10/2016 1035   BILIRUBINUR NEGATIVE 01/10/2016 1035   KETONESUR NEGATIVE 01/10/2016 1035   PROTEINUR NEGATIVE 01/10/2016 1035   UROBILINOGEN  >8.0 (H) 10/30/2008 2328   NITRITE NEGATIVE 01/10/2016 1035   LEUKOCYTESUR NEGATIVE 01/10/2016 1035    Sepsis Labs: Lactic Acid, Venous    Component Value Date/Time   LATICACIDVEN 1.6 04/28/2016 0341    MICROBIOLOGY: Recent Results (from the past 240 hour(s))  C difficile quick scan w PCR reflex     Status: None   Collection Time: 04/28/16  1:37 AM  Result Value Ref Range Status   C Diff antigen NEGATIVE NEGATIVE Final   C Diff toxin NEGATIVE NEGATIVE Final   C Diff interpretation No C. difficile detected.  Final    RADIOLOGY STUDIES/RESULTS: No results found.   LOS: 1 day   Oren Binet, MD  [facility] Pager:[phone]  If 7PM-7AM, please contact night-coverage [email or web address] Password [email or web address] 04/28/2016, 11:54 AM

## 2016-04-28 NOTE — Progress Notes (Addendum)
New Admission Note:  Arrival Method: strecher Mental Orientation: alert (person,place) Telemetry:yes- box 14 2nd verification Assessment: Completed Skin:assessed with [place] Fordyce, check flow sheet XI:VHSJ AC Pain:0 Tubes:N/A Safety Measures: Safety Fall Prevention Plan was discussed with pt and wife Admission: Completed [facility] [place] Orientation: Patient has been orientated to the room, unit and the staff. Family:wife at bedside  Orders have been reviewed and implemented. Will continue to monitor the patient. Call light has been placed within reach and bed alarm has been activated. Unable to finish admission questions due to pt. confusion and wife at bedside.   [place] Alieu Finnigan, RN  Phone Number: [phone]

## 2016-04-28 NOTE — Progress Notes (Signed)
Flexiseal placed per MD order. Pt tolerated well. Watery stool noted in tube after insertion.

## 2016-04-29 ENCOUNTER — Encounter (HOSPITAL_COMMUNITY): Payer: Self-pay | Admitting: Radiology

## 2016-04-29 ENCOUNTER — Inpatient Hospital Stay (HOSPITAL_COMMUNITY): Payer: BLUE CROSS/BLUE SHIELD

## 2016-04-29 DIAGNOSIS — R7881 Bacteremia: Secondary | ICD-10-CM

## 2016-04-29 DIAGNOSIS — G8194 Hemiplegia, unspecified affecting left nondominant side: Secondary | ICD-10-CM

## 2016-04-29 DIAGNOSIS — N183 Chronic kidney disease, stage 3 unspecified: Secondary | ICD-10-CM | POA: Diagnosis present

## 2016-04-29 DIAGNOSIS — A419 Sepsis, unspecified organism: Principal | ICD-10-CM

## 2016-04-29 DIAGNOSIS — N179 Acute kidney failure, unspecified: Secondary | ICD-10-CM

## 2016-04-29 DIAGNOSIS — I679 Cerebrovascular disease, unspecified: Secondary | ICD-10-CM

## 2016-04-29 DIAGNOSIS — R197 Diarrhea, unspecified: Secondary | ICD-10-CM

## 2016-04-29 DIAGNOSIS — E1122 Type 2 diabetes mellitus with diabetic chronic kidney disease: Secondary | ICD-10-CM

## 2016-04-29 HISTORY — DX: Bacteremia: R78.81

## 2016-04-29 HISTORY — DX: Sepsis, unspecified organism: A41.9

## 2016-04-29 LAB — COMPREHENSIVE METABOLIC PANEL
ALT: 44 U/L (ref 17–63)
ANION GAP: 12 (ref 5–15)
AST: 42 U/L — ABNORMAL HIGH (ref 15–41)
Albumin: 2.9 g/dL — ABNORMAL LOW (ref 3.5–5.0)
Alkaline Phosphatase: 39 U/L (ref 38–126)
BUN: 34 mg/dL — ABNORMAL HIGH (ref 6–20)
CALCIUM: 8.4 mg/dL — AB (ref 8.9–10.3)
CO2: 22 mmol/L (ref 22–32)
Chloride: 104 mmol/L (ref 101–111)
Creatinine, Ser: 1.45 mg/dL — ABNORMAL HIGH (ref 0.61–1.24)
GFR calc non Af Amer: 46 mL/min — ABNORMAL LOW (ref >60)
GFR, EST AFRICAN AMERICAN: 54 mL/min — AB (ref >60)
Glucose, Bld: 250 mg/dL — ABNORMAL HIGH (ref 65–99)
Potassium: 3 mmol/L — ABNORMAL LOW (ref 3.5–5.1)
SODIUM: 138 mmol/L (ref 135–145)
Total Bilirubin: 0.8 mg/dL (ref 0.3–1.2)
Total Protein: 6 g/dL — ABNORMAL LOW (ref 6.5–8.1)

## 2016-04-29 LAB — MAGNESIUM: MAGNESIUM: 1.6 mg/dL — AB (ref 1.7–2.4)

## 2016-04-29 LAB — CBC
HCT: 39.5 % (ref 39.0–52.0)
HEMOGLOBIN: 13.6 g/dL (ref 13.0–17.0)
MCH: 27.6 pg (ref 26.0–34.0)
MCHC: 34.4 g/dL (ref 30.0–36.0)
MCV: 80.3 fL (ref 78.0–100.0)
PLATELETS: 127 10*3/uL — AB (ref 150–400)
RBC: 4.92 MIL/uL (ref 4.22–5.81)
RDW: 13.8 % (ref 11.5–15.5)
WBC: 4.9 10*3/uL (ref 4.0–10.5)

## 2016-04-29 LAB — GLUCOSE, CAPILLARY
GLUCOSE-CAPILLARY: 277 mg/dL — AB (ref 65–99)
GLUCOSE-CAPILLARY: 195 mg/dL — AB (ref 65–99)
Glucose-Capillary: 254 mg/dL — ABNORMAL HIGH (ref 65–99)
Glucose-Capillary: 229 mg/dL — ABNORMAL HIGH (ref 65–99)

## 2016-04-29 MED ORDER — INSULIN ASPART 100 UNIT/ML ~~LOC~~ SOLN
3.0000 [IU] | Freq: Three times a day (TID) | SUBCUTANEOUS | Status: DC
  Administered 2016-04-29 – 2016-05-05 (×9): 3 [IU] via SUBCUTANEOUS

## 2016-04-29 MED ORDER — INSULIN GLARGINE 100 UNIT/ML ~~LOC~~ SOLN
12.0000 [IU] | Freq: Every day | SUBCUTANEOUS | Status: DC
  Administered 2016-04-30 – 2016-05-05 (×6): 12 [IU] via SUBCUTANEOUS
  Filled 2016-04-29 (×6): qty 0.12

## 2016-04-29 MED ORDER — IOPAMIDOL (ISOVUE-300) INJECTION 61%: 15.0000 mL | INTRAVENOUS | Status: AC

## 2016-04-29 MED ORDER — POTASSIUM CHLORIDE CRYS ER 20 MEQ PO TBCR
40.0000 meq | EXTENDED_RELEASE_TABLET | Freq: Two times a day (BID) | ORAL | Status: AC
  Administered 2016-04-29: 40 meq via ORAL
  Filled 2016-04-29: qty 2

## 2016-04-29 MED ORDER — IOPAMIDOL (ISOVUE-300) INJECTION 61%
INTRAVENOUS | Status: AC
  Administered 2016-04-29: 100 mL
  Filled 2016-04-29: qty 100

## 2016-04-29 NOTE — Progress Notes (Signed)
Results for DALYN, BECKER (MRN [id number]) as of 04/29/2016 13:43  Ref. Range 04/28/2016 12:03 04/28/2016 16:27 04/28/2016 21:21 04/29/2016 07:29 04/29/2016 11:24  Glucose-Capillary Latest Ref Range: 65 - 99 mg/dL 260 (H) 216 (H) 193 (H) 254 (H) 277 (H)  Noted that blood sugars continue to be greater than 180 mg/dl.  Recommend increasing Lantus to 12 units daily, continue Novolog SENSITIVE correction scale TID & HS, and Novolog 3 units TID when eating at least 50 % of meals.  Harvel Ricks RN BSN CDE

## 2016-04-29 NOTE — Progress Notes (Signed)
PROGRESS NOTE    Evan Stanley  IFO:[id number] DOB: 14-Apr-1941 DOA: 04/27/2016 PCP: Wardell Honour, MD     Brief Narrative:  Evan Stanley is a 75 y.o. male with medical history significant of HTN, HLD, DM, A. Fib, CVA, and R. CEA on 12/2015; who presents with complaints of left-sided facial droop and weakness. History is obtained mostly from the patient's wife. Patient had previously had a stroke after a CT angiogram to evaluate his carotids in March of 2017 with left-sided weakness. Patient subsequently underwent a right carotid artery endarterectomy in June. He underwent physical therapy at that time with almost complete resolution of symptoms. However, patient was noted to have acute loss of ability to walk and left-sided facial droop yesterday(10/8) evening. His wife called 46 and he was brought to [facility]. Initially admitted overnight at [facility] with complaints of left facial droop and generalized weakness of the lower extremities. Patient was determined to be outside of TPA window with initial CT scan of the head negative for any acute process. Subsequent, MRI result received in evolution of old stroke and new occlusive vascular disease. Patient subsequently developed fever and diarrhea. Ua was positive for bacteria and patient was started on rocephin.   Assessment & Plan:   Principal Problem:   Gram-negative bacteremia Active Problems:   DM (diabetes mellitus) ([place])   Essential hypertension   AKI (acute kidney injury) ([place])   Benign essential HTN   CVA (cerebral vascular accident) ([place])   Diarrhea   Hemiparesis ([place])   Sepsis ([place])   CKD stage 3 secondary to diabetes ([place])  Sepsis secondary to gram negative bacteremia -  Blood cultures drawn at [facility] on 10/9: positive for gram neg rods. Final culture pending. Called [facility] today (10/11) -- they are going to re-run the test as it came back positive for Salmonella and do not believe it is a true culture  result. Should have result tomorrow. Patient on rocephin 2g daily which should cover if true positive anyway.  - Urine culture at [facility] negative (confirmed with microlab) - Blood cultures 10/9: NGTD   - C Diff negative   - Continue IV Rocephin 2g daily  - Unknown source of bacteremia. Will order CT abd/pelvis w contrast today as patient still having diarrhea. Check GI PCR panel.  - IVF   Left-sided hemiparesis/ history of CVA: Patient with recurrence of left-sided weakness and symptoms of previous stroke. S/p right carotid CEA. Evaluated by neurology who suspected recrudescence of prior deficits. Recommended rehabilitation services as needed.  - PT/OT to eval and treat - Appreciate Neurology consultation  - Continue aspirin, lipitor   AKI on CKD stage III - Secondary to diarrhea - Improving  - IVF  Hypokalemia - Replace - Monitor   Diabetes mellitus type 2, uncontrolled, with complication including CKD stage III: Hemoglobin A1c at outside facility noted to be 10.3 - Hold metformin  - Lantus and SSI, will add mealtime coverage today   Essential Hypertension - Continue amlodipine, metoprolol  - Hold lisinopril and HCTZ in setting of AKI  DVT prophylaxis: lovenox Code Status: full Family Communication: no family at bedside Disposition Plan: home health PT when stable and pending further work up.   Consultants:   None  Procedures:   None  Antimicrobials:    Rocephin     Subjective: Patient sitting in bed. No new complaints today. He denies any fevers, although states that his room feels very hot. No chest pain, shortness of breath, worsening cough  from baseline cough, abdominal pain. He admits to nausea, vomiting, and diarrhea (loose and watery stools >5 daily, worsening since admission) but no abdominal pain. No dysuria.   Objective: Vitals:   04/28/16 0941 04/28/16 1739 04/28/16 2052 04/29/16 0455  BP: 120/67 (!) 141/55 138/66 (!) 151/77  Pulse: 91 83 86  100  Resp:   20 20  Temp: 99.1 F (37.3 C) 98.4 F (36.9 C) 99.2 F (37.3 C) 99.2 F (37.3 C)  TempSrc: Oral Oral Oral Oral  SpO2: 97% 98% 97% 96%  Weight:   78.1 kg (172 lb 2.9 oz)     Intake/Output Summary (Last 24 hours) at 04/29/16 1058 Last data filed at 04/29/16 0648  Gross per 24 hour  Intake          1838.75 ml  Output              253 ml  Net          1585.75 ml   Filed Weights   04/28/16 2052  Weight: 78.1 kg (172 lb 2.9 oz)    Examination:  General exam: Appears calm and comfortable  Respiratory system: Clear to auscultation. Respiratory effort normal. Cardiovascular system: S1 & S2 heard, RRR. No JVD, murmurs, rubs, gallops or clicks. No pedal edema. Gastrointestinal system: Abdomen is nondistended, soft and nontender. No organomegaly or masses felt. Normal bowel sounds heard. Central nervous system: Alert and oriented. No focal neurological deficits. No facial droop, no focal weakness.  Extremities: Symmetric 5 x 5 power. Skin: No rashes, lesions or ulcers Psychiatry: Judgement and insight appear normal. Mood & affect appropriate.   Data Reviewed: I have personally reviewed following labs and imaging studies  CBC:  Recent Labs Lab 04/28/16 0030 04/29/16 0415  WBC 5.9 4.9  NEUTROABS 4.9  --   HGB 13.5 13.6  HCT 40.2 39.5  MCV 83.8 80.3  PLT 143* 829*   Basic Metabolic Panel:  Recent Labs Lab 04/28/16 0030 04/29/16 0415  NA 137 138  K 3.5 3.0*  CL 101 104  CO2 22 22  GLUCOSE 225* 250*  BUN 32* 34*  CREATININE 1.72* 1.45*  CALCIUM 8.9 8.4*  MG  --  1.6*   GFR: Estimated Creatinine Clearance: 43.7 mL/min (by C-G formula based on SCr of 1.45 mg/dL (H)). Liver Function Tests:  Recent Labs Lab 04/28/16 0030 04/29/16 0415  AST 38 42*  ALT 34 44  ALKPHOS 42 39  BILITOT 1.2 0.8  PROT 6.2* 6.0*  ALBUMIN 3.3* 2.9*   No results for input(s): LIPASE, AMYLASE in the last 168 hours. No results for input(s): AMMONIA in the last 168  hours. Coagulation Profile: No results for input(s): INR, PROTIME in the last 168 hours. Cardiac Enzymes: No results for input(s): CKTOTAL, CKMB, CKMBINDEX, TROPONINI in the last 168 hours. BNP (last 3 results) No results for input(s): PROBNP in the last 8760 hours. HbA1C: No results for input(s): HGBA1C in the last 72 hours. CBG:  Recent Labs Lab 04/28/16 0810 04/28/16 1203 04/28/16 1627 04/28/16 2121 04/29/16 0729  GLUCAP 233* 260* 216* 193* 254*   Lipid Profile: No results for input(s): CHOL, HDL, LDLCALC, TRIG, CHOLHDL, LDLDIRECT in the last 72 hours. Thyroid Function Tests: No results for input(s): TSH, T4TOTAL, FREET4, T3FREE, THYROIDAB in the last 72 hours. Anemia Panel: No results for input(s): VITAMINB12, FOLATE, FERRITIN, TIBC, IRON, RETICCTPCT in the last 72 hours. Sepsis Labs:  Recent Labs Lab 04/28/16 0031 04/28/16 0341  LATICACIDVEN 1.4 1.6    Recent  Results (from the past 240 hour(s))  Culture, blood (routine x 2)     Status: None (Preliminary result)   Collection Time: 04/28/16 12:31 AM  Result Value Ref Range Status   Specimen Description BLOOD RIGHT ARM  Final   Special Requests AEROBIC BOTTLE ONLY 5ML  Final   Culture NO GROWTH < 24 HOURS  Final   Report Status PENDING  Incomplete  Culture, blood (routine x 2)     Status: None (Preliminary result)   Collection Time: 04/28/16 12:46 AM  Result Value Ref Range Status   Specimen Description BLOOD LEFT HAND  Final   Special Requests BOTTLES DRAWN AEROBIC AND ANAEROBIC 5ML  Final   Culture NO GROWTH < 24 HOURS  Final   Report Status PENDING  Incomplete  C difficile quick scan w PCR reflex     Status: None   Collection Time: 04/28/16  1:37 AM  Result Value Ref Range Status   C Diff antigen NEGATIVE NEGATIVE Final   C Diff toxin NEGATIVE NEGATIVE Final   C Diff interpretation No C. difficile detected.  Final       Radiology Studies: No results found.    Scheduled Meds: . amLODipine  10 mg  Oral Daily  . aspirin EC  81 mg Oral Daily  . atorvastatin  40 mg Oral Daily  . cefTRIAXone (ROCEPHIN)  IV  2 g Intravenous Q24H  . enoxaparin (LOVENOX) injection  40 mg Subcutaneous Q24H  . insulin aspart  0-5 Units Subcutaneous QHS  . insulin aspart  0-9 Units Subcutaneous TID WC  . insulin aspart  3 Units Subcutaneous TID WC  . insulin glargine  8 Units Subcutaneous Daily  . iopamidol  15 mL Oral Q1 Hr x 2  . loratadine  10 mg Oral Daily  . metoprolol succinate  100 mg Oral Daily  . potassium chloride  40 mEq Oral BID  . vitamin B-6  250 mg Oral Daily  . vitamin C  500 mg Oral Daily   Continuous Infusions: . sodium chloride 75 mL/hr at 04/29/16 0203     LOS: 2 days    Time spent: 40 minutes    Dessa Phi, DO [facility] Pager [phone]  If 7PM-7AM, please contact night-coverage [email or web address] Password [email or web address] 04/29/2016, 10:58 AM

## 2016-04-29 NOTE — Evaluation (Signed)
Occupational Therapy Evaluation Patient Details Name: Evan Stanley MRN: [id number] DOB: 1941/01/18 Today's Date: 04/29/2016    History of Present Illness This is a 9-yo RH man with a medical history significant of HTN, HLD, DM, A. Fib, CVA, and R. CEA who was transferred from [place] for evaluation of reported aphasia and facial droop. Found to have a fever and diarrhea, likely UTI; Left hemiparesis appears to be recrudescence of prior deficits from a stroke in May 2017. This is in the setting of an acute febrile illness. MRI scan of the brain did not reveal any acute ischemic changes to suggest a new infarction.   Clinical Impression   Pt was independent in self care and mobility prior to admission. Limited session today due to pt with nausea. Pt presents with generalized weakness and impaired standing balance interfering with ability to perform self care. Pt reluctant to move about with flexiseal and urinary catheter. He is eager to learn to results of his abdominal CT and hopeful to return home soon. Will follow acutely.   Follow Up Recommendations  Home health OT (pt may progress and not require)    Equipment Recommendations   (TBD)    Recommendations for Other Services       Precautions / Restrictions Precautions Precautions: Fall Precaution Comments: flexiseal Restrictions Weight Bearing Restrictions: No      Mobility Bed Mobility Overal bed mobility: Needs Assistance Bed Mobility: Rolling;Sidelying to Sit Rolling: Supervision Sidelying to sit: Supervision       General bed mobility comments: used rails, increased time  Transfers Overall transfer level: Needs assistance Equipment used: 1 person hand held assist Transfers: Sit to/from Stand Sit to Stand: Min guard         General transfer comment: Minguard for safety    Balance                                            ADL Overall ADL's : Needs  assistance/impaired Eating/Feeding: Independent;Sitting   Grooming: Wash/dry hands;Wash/dry face;Sitting;Set up   Upper Body Bathing: Set up;Sitting   Lower Body Bathing: Min assist;Sit to/from stand   Upper Body Dressing : Set up;Sitting   Lower Body Dressing: Min assist;Sit to/from stand                 General ADL Comments: Pt reports L side weakness for which he was admitted has resolved. Pt with nausea and poor appetite x 3 days with reported weakness. Pt with abdominal discomfort impeding ability to access his feet for bathing and dressing.     Vision     Perception     Praxis      Pertinent Vitals/Pain Pain Assessment: Faces Faces Pain Scale: Hurts little more Pain Location: abdomen     Hand Dominance Right   Extremity/Trunk Assessment Upper Extremity Assessment Upper Extremity Assessment: Generalized weakness   Lower Extremity Assessment Lower Extremity Assessment: Defer to PT evaluation       Communication Communication Communication: No difficulties   Cognition Arousal/Alertness: Awake/alert Behavior During Therapy: WFL for tasks assessed/performed Overall Cognitive Status: Within Functional Limits for tasks assessed                     General Comments       Exercises       Shoulder Instructions      Home  Living Family/patient expects to be discharged to:: Private residence Living Arrangements: Spouse/significant other Available Help at Discharge: Available PRN/intermittently (works days) Type of Home: House Home Access: Stairs to enter [employer] of Steps: 1 Entrance Stairs-Rails: None Home Layout: Laundry or work area in basement;One level     Bathroom Shower/Tub: Walk-in shower;Door   [employer] Toilet: Standard Bathroom Accessibility: Yes How Accessible: Accessible via walker Home Equipment: Walker - 2 wheels          Prior Functioning/Environment Level of Independence: Independent                  OT Problem List: Decreased strength;Decreased activity tolerance;Impaired balance (sitting and/or standing);Decreased knowledge of use of DME or AE;Pain   OT Treatment/Interventions: Self-care/ADL training;DME and/or AE instruction;Balance training;Patient/family education;Therapeutic activities    OT Goals(Current goals can be found in the care plan section) Acute Rehab OT Goals Patient Stated Goal: feel better OT Goal Formulation: With patient Time For Goal Achievement: 05/13/16 Potential to Achieve Goals: Good ADL Goals Pt Will Perform Grooming: with modified independence;standing Pt Will Perform Lower Body Bathing: with modified independence;sit to/from stand Pt Will Perform Lower Body Dressing: with modified independence;sit to/from stand Pt Will Transfer to Toilet: with modified independence;ambulating;regular height toilet Pt Will Perform Toileting - Clothing Manipulation and hygiene: with modified independence;sit to/from stand  OT Frequency: Min 2X/week   Barriers to D/C:            Co-evaluation              End of Session    Activity Tolerance: Patient limited by fatigue (nausea) Patient left: in bed;with call bell/phone within reach   Time: 1416-1430 OT Time Calculation (min): 14 min Charges:  OT General Charges $OT Visit: 1 Procedure OT Evaluation $OT Eval Moderate Complexity: 1 Procedure G-Codes:    [name] 04/29/2016, 4:17 PM  [phone]

## 2016-04-30 DIAGNOSIS — I10 Essential (primary) hypertension: Secondary | ICD-10-CM

## 2016-04-30 DIAGNOSIS — A09 Infectious gastroenteritis and colitis, unspecified: Secondary | ICD-10-CM

## 2016-04-30 DIAGNOSIS — A021 Salmonella sepsis: Secondary | ICD-10-CM

## 2016-04-30 DIAGNOSIS — Z87891 Personal history of nicotine dependence: Secondary | ICD-10-CM

## 2016-04-30 DIAGNOSIS — E785 Hyperlipidemia, unspecified: Secondary | ICD-10-CM

## 2016-04-30 DIAGNOSIS — K529 Noninfective gastroenteritis and colitis, unspecified: Secondary | ICD-10-CM

## 2016-04-30 DIAGNOSIS — I69392 Facial weakness following cerebral infarction: Secondary | ICD-10-CM

## 2016-04-30 DIAGNOSIS — K5090 Crohn's disease, unspecified, without complications: Secondary | ICD-10-CM | POA: Diagnosis present

## 2016-04-30 DIAGNOSIS — Z952 Presence of prosthetic heart valve: Secondary | ICD-10-CM

## 2016-04-30 DIAGNOSIS — R7881 Bacteremia: Secondary | ICD-10-CM

## 2016-04-30 LAB — CBC WITH DIFFERENTIAL/PLATELET
BASOS PCT: 1 %
Basophils Absolute: 0.1 10*3/uL (ref 0.0–0.1)
EOS PCT: 1 %
Eosinophils Absolute: 0.1 10*3/uL (ref 0.0–0.7)
HEMATOCRIT: 37.5 % — AB (ref 39.0–52.0)
HEMOGLOBIN: 13.1 g/dL (ref 13.0–17.0)
LYMPHS PCT: 22 %
Lymphs Abs: 1.2 10*3/uL (ref 0.7–4.0)
MCH: 27.5 pg (ref 26.0–34.0)
MCHC: 34.9 g/dL (ref 30.0–36.0)
MCV: 78.8 fL (ref 78.0–100.0)
MONOS PCT: 19 %
Monocytes Absolute: 1 10*3/uL (ref 0.1–1.0)
NEUTROS ABS: 2.9 10*3/uL (ref 1.7–7.7)
NEUTROS PCT: 57 %
PLATELETS: 144 10*3/uL — AB (ref 150–400)
RBC: 4.76 MIL/uL (ref 4.22–5.81)
RDW: 13.6 % (ref 11.5–15.5)
WBC: 5.3 10*3/uL (ref 4.0–10.5)

## 2016-04-30 LAB — GLUCOSE, CAPILLARY
Glucose-Capillary: 216 mg/dL — ABNORMAL HIGH (ref 65–99)
Glucose-Capillary: 246 mg/dL — ABNORMAL HIGH (ref 65–99)
Glucose-Capillary: 224 mg/dL — ABNORMAL HIGH (ref 65–99)
Glucose-Capillary: 119 mg/dL — ABNORMAL HIGH (ref 65–99)

## 2016-04-30 LAB — BASIC METABOLIC PANEL
Anion gap: 11 (ref 5–15)
BUN: 25 mg/dL — ABNORMAL HIGH (ref 6–20)
CALCIUM: 8.4 mg/dL — AB (ref 8.9–10.3)
CO2: 23 mmol/L (ref 22–32)
CREATININE: 1.28 mg/dL — AB (ref 0.61–1.24)
Chloride: 106 mmol/L (ref 101–111)
GFR calc Af Amer: >60 mL/min (ref >60)
GFR, EST NON AFRICAN AMERICAN: 54 mL/min — AB (ref >60)
GLUCOSE: 234 mg/dL — AB (ref 65–99)
Potassium: 3.5 mmol/L (ref 3.5–5.1)
Sodium: 140 mmol/L (ref 135–145)

## 2016-04-30 LAB — MAGNESIUM: Magnesium: 1.9 mg/dL (ref 1.7–2.4)

## 2016-04-30 NOTE — Progress Notes (Signed)
Inpatient Diabetes Program Recommendations  AACE/ADA: New Consensus Statement on Inpatient Glycemic Control (2015)  Target Ranges:  Prepandial:   less than 140 mg/dL      Peak postprandial:   less than 180 mg/dL (1-2 hours)      Critically ill patients:  140 - 180 mg/dL   Lab Results  Component Value Date   GLUCAP 216 (H) 04/30/2016   HGBA1C 7.2 (H) 01/10/2016    Review of Glycemic Control  Results for LABIB, CWYNAR (MRN [id number]) as of 04/30/2016 11:19  Ref. Range 04/29/2016 07:29 04/29/2016 11:24 04/29/2016 16:45 04/29/2016 21:46 04/30/2016 08:13  Glucose-Capillary Latest Ref Range: 65 - 99 mg/dL 254 (H) 277 (H) 229 (H) 195 (H) 216 (H)    Diabetes history: Type 2  Outpatient Diabetes medications: Metformin 1056m bid  Current orders for Inpatient glycemic control: Lantus 12 units qam, Novolog 3 units tid, Novolog sensitive correction 0-9 units tid, Novolog 0-5units qhs.  Inpatient Diabetes Program Recommendations:   Agree with recent addition of Lantus 12 units qam.    JGentry Fitz RN, BA, MHA, CDE Diabetes Coordinator Inpatient Diabetes Program  [phone](Team Pager) [phone]([place] 04/30/2016 11:26 AM

## 2016-04-30 NOTE — Consult Note (Signed)
Date of Admission:  04/27/2016  Date of Consult:  04/30/2016  Reason for Consult: Salmonella bacteremia Referring Physician: Dr. Maylene Roes   HPI: Evan Stanley is an 75 y.o. male with history significant for atrial fibrillation, right carotid endarterectomy aortic valve replacement hypertension hyperlipidemia who presented to an outside hospital with left-sided weakness and facial droop. He had been suffering from several weeks of diarrhea but without known fever.  He was seen at [facility] where an MRI was done which showed evolution of an old stroke and new occlusive vascular pathology. He showed fever and lab work was performed which showed him to have leukocytosis and thrombocytopenia acute kidney injury. His blood cultures were drawn and these are essentially grown salmonella. He has been placed on ceftriaxone.   Past Medical History:  Diagnosis Date  . Anxiety   . Arthritis    Right shoulder  . Atrial fibrillation ([place])   . Carotid artery stenosis   . CHF (congestive heart failure) ([place])   . Decreased vision    left eye  . Diabetes mellitus without complication ([place])    Takes Metformin  . Hyperlipidemia   . Hypertension   . Stroke ([place]) 11/18/2015   no deficits  . TIA (transient ischemic attack)   . Urgency of urination     Past Surgical History:  Procedure Laterality Date  . AORTIC VALVE REPLACEMENT    . BACK SURGERY  80s  . CARDIAC VALVE REPLACEMENT    . ENDARTERECTOMY Right 01/13/2016   Procedure: ENDARTERECTOMY CAROTID - RIGHT;  Surgeon: Conrad Muenster, MD;  Location: [facility];  Service: Vascular;  Laterality: Right;  . laser surgery, left eye  Left 10-29-2015    retinal surgery/ Deloria Lair MD   . PERIPHERAL VASCULAR CATHETERIZATION Right 11/18/2015   Procedure: Carotid Angiography;  Surgeon: Conrad Hanapepe, MD;  Location: [facility];  Service: Cardiovascular;  Laterality: Right;  . PERIPHERAL VASCULAR CATHETERIZATION N/A 11/18/2015   Procedure: Aortic  Arch Angiography;  Surgeon: Conrad Poth, MD;  Location: [facility];  Service: Cardiovascular;  Laterality: N/A;    Social History:  reports that he quit smoking about 31 years ago. He has never used smokeless tobacco. He reports that he does not drink alcohol or use drugs.   Family History  Problem Relation Age of Onset  . Heart disease Mother   . Stroke Father 19    No Known Allergies   Medications: I have reviewed patients current medications as documented in Epic Anti-infectives    Start     Dose/Rate Route Frequency Ordered Stop   04/29/16 0200  cefTRIAXone (ROCEPHIN) 2 g in dextrose 5 % 50 mL IVPB     2 g 100 mL/hr over 30 Minutes Intravenous Every 24 hours 04/28/16 1152     04/29/16 0045  cefTRIAXone (ROCEPHIN) 2 g in dextrose 5 % 50 mL IVPB  Status:  Discontinued     2 g 100 mL/hr over 30 Minutes Intravenous Every 24 hours 04/28/16 1151 04/28/16 1152   04/28/16 0045  cefTRIAXone (ROCEPHIN) 1 g in dextrose 5 % 50 mL IVPB  Status:  Discontinued     1 g 100 mL/hr over 30 Minutes Intravenous Every 24 hours 04/28/16 0027 04/28/16 1151         ROS:  as in HPI otherwise remainder of 12 point Review of Systems is negative   Blood pressure (!) 153/64, pulse 94, temperature 99.3 F (37.4 C), temperature  source Oral, resp. rate 18, weight 172 lb 2.9 oz (78.1 kg), SpO2 98 %. General: Alert and awake, oriented x3, not in any acute distress. HEENT: anicteric sclera,  EOMI, oropharynx clear and without exudate, he still has left-sided facial droop that is visible when he is asked to smile Cardiovascular: Irregular regular rate rhythm rate, 2/6 murmur throughout the precordium Pulmonary: clear to auscultation bilaterally, no wheezing, rales or rhonchi Gastrointestinal: soft nontender, nondistended, normal bowel sounds, Musculoskeletal: no  clubbing or edema noted bilaterally Skin, soft tissue: no rashes Neuro: His strength on the left side seems to be  returning.   Results for orders placed or performed during the hospital encounter of 04/27/16 (from the past 48 hour(s))  Glucose, capillary     Status: Abnormal   Collection Time: 04/28/16  9:21 PM  Result Value Ref Range   Glucose-Capillary 193 (H) 65 - 99 mg/dL  CBC     Status: Abnormal   Collection Time: 04/29/16  4:15 AM  Result Value Ref Range   WBC 4.9 4.0 - 10.5 K/uL   RBC 4.92 4.22 - 5.81 MIL/uL   Hemoglobin 13.6 13.0 - 17.0 g/dL   HCT 39.5 39.0 - 52.0 %   MCV 80.3 78.0 - 100.0 fL   MCH 27.6 26.0 - 34.0 pg   MCHC 34.4 30.0 - 36.0 g/dL   RDW 13.8 11.5 - 15.5 %   Platelets 127 (L) 150 - 400 K/uL  Comprehensive metabolic panel     Status: Abnormal   Collection Time: 04/29/16  4:15 AM  Result Value Ref Range   Sodium 138 135 - 145 mmol/L   Potassium 3.0 (L) 3.5 - 5.1 mmol/L   Chloride 104 101 - 111 mmol/L   CO2 22 22 - 32 mmol/L   Glucose, Bld 250 (H) 65 - 99 mg/dL   BUN 34 (H) 6 - 20 mg/dL   Creatinine, Ser 1.45 (H) 0.61 - 1.24 mg/dL   Calcium 8.4 (L) 8.9 - 10.3 mg/dL   Total Protein 6.0 (L) 6.5 - 8.1 g/dL   Albumin 2.9 (L) 3.5 - 5.0 g/dL   AST 42 (H) 15 - 41 U/L   ALT 44 17 - 63 U/L   Alkaline Phosphatase 39 38 - 126 U/L   Total Bilirubin 0.8 0.3 - 1.2 mg/dL   GFR calc non Af Amer 46 (L) >60 mL/min   GFR calc Af Amer 54 (L) >60 mL/min    Comment: (NOTE) The eGFR has been calculated using the CKD EPI equation. This calculation has not been validated in all clinical situations. eGFR's persistently <60 mL/min signify possible Chronic Kidney Disease.    Anion gap 12 5 - 15  Magnesium     Status: Abnormal   Collection Time: 04/29/16  4:15 AM  Result Value Ref Range   Magnesium 1.6 (L) 1.7 - 2.4 mg/dL  Glucose, capillary     Status: Abnormal   Collection Time: 04/29/16  7:29 AM  Result Value Ref Range   Glucose-Capillary 254 (H) 65 - 99 mg/dL  Glucose, capillary     Status: Abnormal   Collection Time: 04/29/16 11:24 AM  Result Value Ref Range    Glucose-Capillary 277 (H) 65 - 99 mg/dL  Glucose, capillary     Status: Abnormal   Collection Time: 04/29/16  4:45 PM  Result Value Ref Range   Glucose-Capillary 229 (H) 65 - 99 mg/dL  Glucose, capillary     Status: Abnormal   Collection Time: 04/29/16  9:46 PM  Result Value Ref Range   Glucose-Capillary 195 (H) 65 - 99 mg/dL  CBC with Differential/Platelet     Status: Abnormal   Collection Time: 04/30/16  5:14 AM  Result Value Ref Range   WBC 5.3 4.0 - 10.5 K/uL    Comment: WHITE COUNT CONFIRMED ON SMEAR   RBC 4.76 4.22 - 5.81 MIL/uL   Hemoglobin 13.1 13.0 - 17.0 g/dL   HCT 37.5 (L) 39.0 - 52.0 %   MCV 78.8 78.0 - 100.0 fL   MCH 27.5 26.0 - 34.0 pg   MCHC 34.9 30.0 - 36.0 g/dL   RDW 13.6 11.5 - 15.5 %   Platelets 144 (L) 150 - 400 K/uL   Neutrophils Relative % 57 %   Lymphocytes Relative 22 %   Monocytes Relative 19 %   Eosinophils Relative 1 %   Basophils Relative 1 %   Neutro Abs 2.9 1.7 - 7.7 K/uL   Lymphs Abs 1.2 0.7 - 4.0 K/uL   Monocytes Absolute 1.0 0.1 - 1.0 K/uL   Eosinophils Absolute 0.1 0.0 - 0.7 K/uL   Basophils Absolute 0.1 0.0 - 0.1 K/uL   RBC Morphology POLYCHROMASIA PRESENT     Comment: BURR CELLS   WBC Morphology DOHLE BODIES     Comment: MILD LEFT SHIFT (1-5% METAS, OCC MYELO, OCC BANDS)  Basic metabolic panel     Status: Abnormal   Collection Time: 04/30/16  5:14 AM  Result Value Ref Range   Sodium 140 135 - 145 mmol/L   Potassium 3.5 3.5 - 5.1 mmol/L   Chloride 106 101 - 111 mmol/L   CO2 23 22 - 32 mmol/L   Glucose, Bld 234 (H) 65 - 99 mg/dL   BUN 25 (H) 6 - 20 mg/dL   Creatinine, Ser 1.28 (H) 0.61 - 1.24 mg/dL   Calcium 8.4 (L) 8.9 - 10.3 mg/dL   GFR calc non Af Amer 54 (L) >60 mL/min   GFR calc Af Amer >60 >60 mL/min    Comment: (NOTE) The eGFR has been calculated using the CKD EPI equation. This calculation has not been validated in all clinical situations. eGFR's persistently <60 mL/min signify possible Chronic Kidney Disease.    Anion  gap 11 5 - 15  Magnesium     Status: None   Collection Time: 04/30/16  5:14 AM  Result Value Ref Range   Magnesium 1.9 1.7 - 2.4 mg/dL  Glucose, capillary     Status: Abnormal   Collection Time: 04/30/16  8:13 AM  Result Value Ref Range   Glucose-Capillary 216 (H) 65 - 99 mg/dL  Glucose, capillary     Status: Abnormal   Collection Time: 04/30/16 11:37 AM  Result Value Ref Range   Glucose-Capillary 246 (H) 65 - 99 mg/dL  Glucose, capillary     Status: Abnormal   Collection Time: 04/30/16  4:29 PM  Result Value Ref Range   Glucose-Capillary 224 (H) 65 - 99 mg/dL   _0 (sdes,specrequest,cult,reptstatus)   ) Recent Results (from the past 720 hour(s))  Culture, blood (routine x 2)     Status: None (Preliminary result)   Collection Time: 04/28/16 12:31 AM  Result Value Ref Range Status   Specimen Description BLOOD RIGHT ARM  Final   Special Requests AEROBIC BOTTLE ONLY 5ML  Final   Culture NO GROWTH 2 DAYS  Final   Report Status PENDING  Incomplete  Culture, blood (routine x 2)     Status: None (Preliminary result)   Collection Time: 04/28/16  12:46 AM  Result Value Ref Range Status   Specimen Description BLOOD LEFT HAND  Final   Special Requests BOTTLES DRAWN AEROBIC AND ANAEROBIC 5ML  Final   Culture NO GROWTH 2 DAYS  Final   Report Status PENDING  Incomplete  C difficile quick scan w PCR reflex     Status: None   Collection Time: 04/28/16  1:37 AM  Result Value Ref Range Status   C Diff antigen NEGATIVE NEGATIVE Final   C Diff toxin NEGATIVE NEGATIVE Final   C Diff interpretation No C. difficile detected.  Final     Impression/Recommendation  Principal Problem:   Salmonella bacteremia Active Problems:   DM (diabetes mellitus) ([place])   Essential hypertension   AKI (acute kidney injury) ([place])   Benign essential HTN   CVA (cerebral vascular accident) ([place])   Diarrhea   Sepsis ([place])   CKD stage 3 secondary to diabetes ([place])   Ileocolitis   Evan Stanley  is a 75 y.o. male with Hypertension hyperlipidemia who was admitted to [facility] with a stroke that occurred in the context of diarrhea fever and apparent Salmonella bacteremia. He has a known prosthetic aortic valve.  #1 Salmonella bacteremia:  --Repeat blood cultures here  --Obtain data from [facility] including sensitivity data  --I called cardiology and asked for a transesophageal echocardiogram to evaluate his prosthetic valve  #2 Screening: will check for HCV, HBV and HIV   04/30/2016, 5:18 PM   Thank you so much for this interesting consult  [place] for [place] [phone] (pager) [phone] (office) 04/30/2016, 5:18 PM  Evan Stanley 04/30/2016, 5:18 PM

## 2016-04-30 NOTE — Progress Notes (Signed)
PROGRESS NOTE    Evan Stanley  SWF:[id number] DOB: October 19, 1940 DOA: 04/27/2016 PCP: Wardell Honour, MD     Brief Narrative:  Evan Stanley is a 75 y.o. male with medical history significant of HTN, HLD, DM, A. Fib, CVA, and R. CEA on 12/2015; who presents with complaints of left-sided facial droop and weakness. History is obtained mostly from the patient's wife. Patient had previously had a stroke after a CT angiogram to evaluate his carotids in March of 2017 with left-sided weakness. Patient subsequently underwent a right carotid artery endarterectomy in June. He underwent physical therapy at that time with almost complete resolution of symptoms. However, patient was noted to have acute loss of ability to walk and left-sided facial droop yesterday (10/8) evening. His wife called 60 and he was brought to [facility]. Initially admitted overnight at [facility] with complaints of left facial droop and generalized weakness of the lower extremities. Patient was determined to be outside of TPA window with initial CT scan of the head negative for any acute process. Subsequent, MRI result received in evolution of old stroke and new occlusive vascular disease. Patient subsequently developed fever and diarrhea. Ua was positive for bacteria and patient was started on rocephin.    Assessment & Plan:   Principal Problem:   Salmonella bacteremia Active Problems:   DM (diabetes mellitus) ([place])   Essential hypertension   AKI (acute kidney injury) ([place])   Benign essential HTN   CVA (cerebral vascular accident) ([place])   Diarrhea   Sepsis ([place])   CKD stage 3 secondary to diabetes ([place])   Ileocolitis  Sepsis secondary to ileocolitis and salmonella bacteremia - Blood cultures drawn at [facility] on 10/9: positive for gram neg rods, Salmonella  - Urine culture at [facility] negative (confirmed with microlab) - Blood cultures 10/9: NGTD day 1  - C Diff negative   - CT abd/pelvis 10/11: Abnormal wall  thickening in the distal ileum, cecum, and ascending colon, suspicious for ileocolitis  - Spoke with Azucena Freed, GI PA. No further inpatient work up needed. Treat bacteremia and patient needs outpatient GI referral for future colonoscopy once acute episode resolves. Patient has never had a colonoscopy before.  - Continue IV Rocephin 2g daily  - ID consulted  - Check HIV - IVF   Left-sided hemiparesis/ history of CVA: Patient with recurrence of left-sided weakness and symptoms of previous stroke. S/p right carotid CEA. Evaluated by neurology who suspected recrudescence of prior deficits. Recommended rehabilitation services as needed.  - PT/OT - Appreciate Neurology consultation  - Continue aspirin, lipitor   AKI on CKD stage III - Secondary to diarrhea - Improving  - IVF  Hypokalemia - Replace - Monitor   Diabetes mellitus type 2, uncontrolled, with complication including CKD stage III: Hemoglobin A1c at outside facility noted to be 10.3 - Hold metformin  - Lantus and SSI, will add mealtime coverage   Essential Hypertension - Continue amlodipine, metoprolol  - Hold lisinopril and HCTZ in setting of AKI  DVT prophylaxis: lovenox Code Status: full Family Communication: wife and son at bedside  Disposition Plan: home health PT when stable and pending further treatment and clearance of bacteremia.    Consultants:   None  Procedures:   None  Antimicrobials:    Rocephin    Subjective: Patient sitting in bed. No new complaints today. He denies any fevers, no chest pain, shortness of breath, abdominal pain, nausea, vomiting. Flexiseal remains in place.   Objective: Vitals:   04/29/16  2007 04/29/16 2148 04/30/16 0527 04/30/16 0927  BP:  135/63 (!) 161/64 (!) 153/64  Pulse: (!) 102 77 90 94  Resp:  16 18 18   Temp: 97.7 F (36.5 C) 98.4 F (36.9 C) 97.3 F (36.3 C) 99.3 F (37.4 C)  TempSrc: Oral Oral Oral Oral  SpO2:  96% 99% 98%  Weight:         Intake/Output Summary (Last 24 hours) at 04/30/16 1043 Last data filed at 04/30/16 0545  Gross per 24 hour  Intake              480 ml  Output             1010 ml  Net             -530 ml   Filed Weights   04/28/16 2052  Weight: 78.1 kg (172 lb 2.9 oz)    Examination:  General exam: Appears calm and comfortable  Respiratory system: Clear to auscultation. Respiratory effort normal. Cardiovascular system: S1 & S2 heard, RRR. No JVD, murmurs, rubs, gallops or clicks. No pedal edema. Gastrointestinal system: Abdomen is +distended, soft and nontender. No organomegaly or masses felt. Normal bowel sounds heard. Flexiseal in place.  Central nervous system: Alert and oriented. No focal neurological deficits. No facial droop, no focal weakness.  Extremities: Symmetric 5 x 5 power. Skin: No rashes, lesions or ulcers Psychiatry: Judgement and insight appear normal. Mood & affect appropriate.   Data Reviewed: I have personally reviewed following labs and imaging studies  CBC:  Recent Labs Lab 04/28/16 0030 04/29/16 0415 04/30/16 0514  WBC 5.9 4.9 5.3  NEUTROABS 4.9  --  2.9  HGB 13.5 13.6 13.1  HCT 40.2 39.5 37.5*  MCV 83.8 80.3 78.8  PLT 143* 127* 974*   Basic Metabolic Panel:  Recent Labs Lab 04/28/16 0030 04/29/16 0415 04/30/16 0514  NA 137 138 140  K 3.5 3.0* 3.5  CL 101 104 106  CO2 22 22 23   GLUCOSE 225* 250* 234*  BUN 32* 34* 25*  CREATININE 1.72* 1.45* 1.28*  CALCIUM 8.9 8.4* 8.4*  MG  --  1.6* 1.9   GFR: Estimated Creatinine Clearance: 49.5 mL/min (by C-G formula based on SCr of 1.28 mg/dL (H)). Liver Function Tests:  Recent Labs Lab 04/28/16 0030 04/29/16 0415  AST 38 42*  ALT 34 44  ALKPHOS 42 39  BILITOT 1.2 0.8  PROT 6.2* 6.0*  ALBUMIN 3.3* 2.9*   No results for input(s): LIPASE, AMYLASE in the last 168 hours. No results for input(s): AMMONIA in the last 168 hours. Coagulation Profile: No results for input(s): INR, PROTIME in the last 168  hours. Cardiac Enzymes: No results for input(s): CKTOTAL, CKMB, CKMBINDEX, TROPONINI in the last 168 hours. BNP (last 3 results) No results for input(s): PROBNP in the last 8760 hours. HbA1C: No results for input(s): HGBA1C in the last 72 hours. CBG:  Recent Labs Lab 04/29/16 0729 04/29/16 1124 04/29/16 1645 04/29/16 2146 04/30/16 0813  GLUCAP 254* 277* 229* 195* 216*   Lipid Profile: No results for input(s): CHOL, HDL, LDLCALC, TRIG, CHOLHDL, LDLDIRECT in the last 72 hours. Thyroid Function Tests: No results for input(s): TSH, T4TOTAL, FREET4, T3FREE, THYROIDAB in the last 72 hours. Anemia Panel: No results for input(s): VITAMINB12, FOLATE, FERRITIN, TIBC, IRON, RETICCTPCT in the last 72 hours. Sepsis Labs:  Recent Labs Lab 04/28/16 0031 04/28/16 0341  LATICACIDVEN 1.4 1.6    Recent Results (from the past 240 hour(s))  Culture, blood (  routine x 2)     Status: None (Preliminary result)   Collection Time: 04/28/16 12:31 AM  Result Value Ref Range Status   Specimen Description BLOOD RIGHT ARM  Final   Special Requests AEROBIC BOTTLE ONLY 5ML  Final   Culture NO GROWTH 1 DAY  Final   Report Status PENDING  Incomplete  Culture, blood (routine x 2)     Status: None (Preliminary result)   Collection Time: 04/28/16 12:46 AM  Result Value Ref Range Status   Specimen Description BLOOD LEFT HAND  Final   Special Requests BOTTLES DRAWN AEROBIC AND ANAEROBIC 5ML  Final   Culture NO GROWTH 1 DAY  Final   Report Status PENDING  Incomplete  C difficile quick scan w PCR reflex     Status: None   Collection Time: 04/28/16  1:37 AM  Result Value Ref Range Status   C Diff antigen NEGATIVE NEGATIVE Final   C Diff toxin NEGATIVE NEGATIVE Final   C Diff interpretation No C. difficile detected.  Final       Radiology Studies: Ct Abdomen Pelvis W Contrast  Result Date: 04/29/2016 CLINICAL DATA:  Diarrhea for 1 month. G negative bacteremia. Nausea and vomiting. EXAM: CT ABDOMEN  AND PELVIS WITH CONTRAST TECHNIQUE: Multidetector CT imaging of the abdomen and pelvis was performed using the standard protocol following bolus administration of intravenous contrast. CONTRAST:  169m ISOVUE-300 IOPAMIDOL (ISOVUE-300) INJECTION 61% COMPARISON:  11/28/2015 FINDINGS: Lower chest: Aortic valve prosthesis.  Coronary atherosclerosis. Hepatobiliary: Mildly contracted gallbladder. Pancreas: Unremarkable Spleen: Unremarkable Adrenals/Urinary Tract: Hypodense 6 mm lesion of the right kidney lower pole, nonspecific due to small size, image 27/301. Stomach/Bowel: Abnormal wall thickening in the distal ileum extending to the ileocecal valve with potential wall thickening and smoothing of the wall contour in the cecum and ascending colon. Air-fluid levels in the distal colon favoring diarrheal process. Flexi-seal fecal collector device in the rectum. Vascular/Lymphatic: Aortoiliac atherosclerotic vascular disease. There is notable atheromatous narrowing in the left renal artery. Small gastrohepatic lymph nodes are not pathologically enlarged by size criteria. Reproductive: Calcifications along the posterior portion of the central zone of the prostate gland. Other: No supplemental non-categorized findings. Musculoskeletal: Mild degenerative arthropathy of both hips. Lumbar spondylosis and degenerative disc disease causing suspected impingement at the L3-4 level. Small umbilical hernia contains adipose tissue. IMPRESSION: 1. Abnormal wall thickening in the distal ileum, cecum, and ascending colon, suspicious for ileocolitis. This could be infectious or due to Crohn's disease. 2. Extensive atherosclerosis, including the coronary arteries. Suspected high-grade stenosis in the left renal artery. 3. Nonspecific 6 mm hypodense lesion in the right kidney lower pole, likely a cyst but technically nonspecific due to small size. 4. Scattered air-fluid levels in the distal colon favoring diarrheal process. 5. Probable  impingement at the L3-4 level due to spondylosis and degenerative disc disease. Electronically Signed   By: WVan ClinesM.D.   On: 04/29/2016 15:00      Scheduled Meds: . amLODipine  10 mg Oral Daily  . aspirin EC  81 mg Oral Daily  . atorvastatin  40 mg Oral Daily  . cefTRIAXone (ROCEPHIN)  IV  2 g Intravenous Q24H  . enoxaparin (LOVENOX) injection  40 mg Subcutaneous Q24H  . insulin aspart  0-5 Units Subcutaneous QHS  . insulin aspart  0-9 Units Subcutaneous TID WC  . insulin aspart  3 Units Subcutaneous TID WC  . insulin glargine  12 Units Subcutaneous Daily  . loratadine  10 mg Oral Daily  .  metoprolol succinate  100 mg Oral Daily  . vitamin B-6  250 mg Oral Daily  . vitamin C  500 mg Oral Daily   Continuous Infusions: . sodium chloride 75 mL/hr at 04/30/16 0629     LOS: 3 days    Time spent: 40 minutes    Dessa Phi, DO [facility] Pager [phone]  If 7PM-7AM, please contact night-coverage [email or web address] Password [facility] 04/30/2016, 10:43 AM

## 2016-04-30 NOTE — Progress Notes (Signed)
PT Cancellation Note  Patient Details Name: Captain Blucher MRN: [id number] DOB: 09-11-1940   Cancelled Treatment:    Reason Eval/Treat Not Completed: Patient declined, no reason specified.  Patient reports he doesn't feel up to getting up today.  Max encouragement provided.  Will return tomorrow for PT session.   Despina Pole 04/30/2016, 12:44 PM Carita Pian. Sanjuana Kava, [place] Pager [phone]

## 2016-05-01 ENCOUNTER — Encounter (HOSPITAL_COMMUNITY): Payer: Self-pay | Admitting: *Deleted

## 2016-05-01 DIAGNOSIS — A020 Salmonella enteritis: Secondary | ICD-10-CM

## 2016-05-01 DIAGNOSIS — I69354 Hemiplegia and hemiparesis following cerebral infarction affecting left non-dominant side: Secondary | ICD-10-CM

## 2016-05-01 LAB — GLUCOSE, CAPILLARY
GLUCOSE-CAPILLARY: 155 mg/dL — AB (ref 65–99)
GLUCOSE-CAPILLARY: 192 mg/dL — AB (ref 65–99)
Glucose-Capillary: 128 mg/dL — ABNORMAL HIGH (ref 65–99)
Glucose-Capillary: 100 mg/dL — ABNORMAL HIGH (ref 65–99)

## 2016-05-01 LAB — CBC WITH DIFFERENTIAL/PLATELET
BASOS ABS: 0.1 10*3/uL (ref 0.0–0.1)
Basophils Relative: 1 %
EOS ABS: 0.1 10*3/uL (ref 0.0–0.7)
EOS PCT: 2 %
HCT: 35 % — ABNORMAL LOW (ref 39.0–52.0)
Hemoglobin: 11.9 g/dL — ABNORMAL LOW (ref 13.0–17.0)
Lymphocytes Relative: 23 %
Lymphs Abs: 1.4 10*3/uL (ref 0.7–4.0)
MCH: 26.7 pg (ref 26.0–34.0)
MCHC: 34 g/dL (ref 30.0–36.0)
MCV: 78.7 fL (ref 78.0–100.0)
MONO ABS: 1 10*3/uL (ref 0.1–1.0)
Monocytes Relative: 16 %
NEUTROS PCT: 58 %
Neutro Abs: 3.5 10*3/uL (ref 1.7–7.7)
PLATELETS: 142 10*3/uL — AB (ref 150–400)
RBC: 4.45 MIL/uL (ref 4.22–5.81)
RDW: 13.6 % (ref 11.5–15.5)
WBC: 6.1 10*3/uL (ref 4.0–10.5)

## 2016-05-01 LAB — BASIC METABOLIC PANEL
ANION GAP: 8 (ref 5–15)
BUN: 18 mg/dL (ref 6–20)
CO2: 25 mmol/L (ref 22–32)
Calcium: 8.5 mg/dL — ABNORMAL LOW (ref 8.9–10.3)
Chloride: 107 mmol/L (ref 101–111)
Creatinine, Ser: 1.16 mg/dL (ref 0.61–1.24)
Glucose, Bld: 156 mg/dL — ABNORMAL HIGH (ref 65–99)
POTASSIUM: 2.7 mmol/L — AB (ref 3.5–5.1)
SODIUM: 140 mmol/L (ref 135–145)

## 2016-05-01 LAB — HIV ANTIBODY (ROUTINE TESTING W REFLEX): HIV Screen 4th Generation wRfx: NONREACTIVE

## 2016-05-01 LAB — POTASSIUM: POTASSIUM: 3.4 mmol/L — AB (ref 3.5–5.1)

## 2016-05-01 MED ORDER — POTASSIUM CHLORIDE CRYS ER 20 MEQ PO TBCR
40.0000 meq | EXTENDED_RELEASE_TABLET | Freq: Two times a day (BID) | ORAL | Status: DC
  Filled 2016-05-01: qty 2

## 2016-05-01 MED ORDER — POTASSIUM CHLORIDE CRYS ER 20 MEQ PO TBCR
40.0000 meq | EXTENDED_RELEASE_TABLET | ORAL | Status: AC
  Administered 2016-05-01 (×2): 40 meq via ORAL
  Filled 2016-05-01: qty 2

## 2016-05-01 NOTE — Progress Notes (Signed)
OT Cancellation Note  Patient Details Name: Evan Stanley MRN: [id number] DOB: [birth year]   Cancelled Treatment:    Reason Eval/Treat Not Completed: Medical issues which prohibited therapy. Pt's K+  = 2.7.  Has not initiated K+ at this time. Will check back later, if schedule permits.  Geoge Lawrance 05/01/2016, 8:39 AM  Lesle Chris, OTR/L [phone] 05/01/2016

## 2016-05-01 NOTE — Progress Notes (Signed)
Subjective:  Still c/o diarrhea   Antibiotics:  Anti-infectives    Start     Dose/Rate Route Frequency Ordered Stop   04/29/16 0200  cefTRIAXone (ROCEPHIN) 2 g in dextrose 5 % 50 mL IVPB     2 g 100 mL/hr over 30 Minutes Intravenous Every 24 hours 04/28/16 1152     04/29/16 0045  cefTRIAXone (ROCEPHIN) 2 g in dextrose 5 % 50 mL IVPB  Status:  Discontinued     2 g 100 mL/hr over 30 Minutes Intravenous Every 24 hours 04/28/16 1151 04/28/16 1152   04/28/16 0045  cefTRIAXone (ROCEPHIN) 1 g in dextrose 5 % 50 mL IVPB  Status:  Discontinued     1 g 100 mL/hr over 30 Minutes Intravenous Every 24 hours 04/28/16 0027 04/28/16 1151      Medications: Scheduled Meds: . amLODipine  10 mg Oral Daily  . aspirin EC  81 mg Oral Daily  . atorvastatin  40 mg Oral Daily  . cefTRIAXone (ROCEPHIN)  IV  2 g Intravenous Q24H  . enoxaparin (LOVENOX) injection  40 mg Subcutaneous Q24H  . insulin aspart  0-5 Units Subcutaneous QHS  . insulin aspart  0-9 Units Subcutaneous TID WC  . insulin aspart  3 Units Subcutaneous TID WC  . insulin glargine  12 Units Subcutaneous Daily  . loratadine  10 mg Oral Daily  . metoprolol succinate  100 mg Oral Daily  . vitamin B-6  250 mg Oral Daily  . vitamin C  500 mg Oral Daily   Continuous Infusions:  PRN Meds:.acetaminophen **OR** acetaminophen, albuterol, ondansetron **OR** ondansetron (ZOFRAN) IV    Objective: Weight change:   Intake/Output Summary (Last 24 hours) at 05/01/16 1447 Last data filed at 05/01/16 1100  Gross per 24 hour  Intake              360 ml  Output             1570 ml  Net            -1210 ml   Blood pressure (!) 155/68, pulse 82, temperature 97.8 F (36.6 C), temperature source Oral, resp. rate 17, height 5' 5"  (1.651 m), weight 172 lb 2.9 oz (78.1 kg), SpO2 98 %. Temp:  [97.8 F (36.6 C)-98.2 F (36.8 C)] 97.8 F (36.6 C) (10/13 0910) Pulse Rate:  [73-84] 82 (10/13 0910) Resp:  [16-18] 17 (10/13 0910) BP:  (126-164)/(53-71) 155/68 (10/13 0910) SpO2:  [97 %-99 %] 98 % (10/13 0910)  Physical Exam: General: Alert and awake, oriented x3, not in any acute distress. HEENT: anicteric sclera,  EOMI, oropharynx clear and without exudate, he still has left-sided facial droop that is visible when he is asked to smile Cardiovascular: Irregular regular rate rhythm rate, 2/6 murmur throughout the precordium Pulmonary: clear to auscultation bilaterally, no wheezing, rales or rhonchi Gastrointestinal: TTP in RUQ nondistended, normal bowel sounds, Musculoskeletal: no  clubbing or edema noted bilaterally Skin, soft tissue: no rashes Neuro: His strength on the left side seems to be returning.  CBC:  CBC Latest Ref Rng & Units 05/01/2016 04/30/2016 04/29/2016  WBC 4.0 - 10.5 K/uL 6.1 5.3 4.9  Hemoglobin 13.0 - 17.0 g/dL 11.9(L) 13.1 13.6  Hematocrit 39.0 - 52.0 % 35.0(L) 37.5(L) 39.5  Platelets 150 - 400 K/uL 142(L) 144(L) 127(L)      BMET  Recent Labs  04/30/16 0514 05/01/16 0426  NA 140 140  K 3.5 2.7*  CL 106  107  CO2 23 25  GLUCOSE 234* 156*  BUN 25* 18  CREATININE 1.28* 1.16  CALCIUM 8.4* 8.5*     Liver Panel   Recent Labs  04/29/16 0415  PROT 6.0*  ALBUMIN 2.9*  AST 42*  ALT 44  ALKPHOS 39  BILITOT 0.8       Sedimentation Rate No results for input(s): ESRSEDRATE in the last 72 hours. C-Reactive Protein No results for input(s): CRP in the last 72 hours.  Micro Results: Recent Results (from the past 720 hour(s))  Culture, blood (routine x 2)     Status: None (Preliminary result)   Collection Time: 04/28/16 12:31 AM  Result Value Ref Range Status   Specimen Description BLOOD RIGHT ARM  Final   Special Requests AEROBIC BOTTLE ONLY 5ML  Final   Culture NO GROWTH 3 DAYS  Final   Report Status PENDING  Incomplete  Culture, blood (routine x 2)     Status: None (Preliminary result)   Collection Time: 04/28/16 12:46 AM  Result Value Ref Range Status   Specimen  Description BLOOD LEFT HAND  Final   Special Requests BOTTLES DRAWN AEROBIC AND ANAEROBIC 5ML  Final   Culture NO GROWTH 3 DAYS  Final   Report Status PENDING  Incomplete  C difficile quick scan w PCR reflex     Status: None   Collection Time: 04/28/16  1:37 AM  Result Value Ref Range Status   C Diff antigen NEGATIVE NEGATIVE Final   C Diff toxin NEGATIVE NEGATIVE Final   C Diff interpretation No C. difficile detected.  Final    Studies/Results: No results found.    Assessment/Plan:  INTERVAL HISTORY:   Called [facility] lab and Salmonella species --> to [facility] for [facility] are :  S to ceftriaxone S ciprofloxacin S TMP/SMX S AMP   Principal Problem:   Salmonella bacteremia Active Problems:   DM (diabetes mellitus) ([place])   Essential hypertension   AKI (acute kidney injury) ([place])   Benign essential HTN   CVA (cerebral vascular accident) ([place])   Diarrhea   Sepsis ([place])   CKD stage 3 secondary to diabetes ([place])   Ileocolitis    Evan Stanley is a 75 y.o. male with  Prosthetic aortic valve, admitted at [facility] with a stroke that occurred in the context of diarrhea fever and found to have bacteremia with Salmonella  #1 Salmonella bacteremia:  --Repeat blood cultures here --Cardiology to perform TEE next week  #2 Screening: HIV -, HCV and HBV pending  #3 IP: put on enteric precautions given Salmonella infection and ongoing diarrhea  Dr. Baxter Flattery will be covering this weekend and is available for questions and is taking over consult service for last part of October.    LOS: 4 days   Evan Stanley 05/01/2016, 2:47 PM

## 2016-05-01 NOTE — Progress Notes (Signed)
PROGRESS NOTE    Evan Stanley  RWE:[id number] DOB: 1941/07/10 DOA: 04/27/2016 PCP: Wardell Honour, MD     Brief Narrative:  Evan Stanley is a 75 y.o. male with medical history significant of HTN, HLD, DM, A. Fib, CVA, and R. CEA on 12/2015; who presents with complaints of left-sided facial droop and weakness. History is obtained mostly from the patient's wife. Patient had previously had a stroke after a CT angiogram to evaluate his carotids in March of 2017 with left-sided weakness. Patient subsequently underwent a right carotid artery endarterectomy in June. He underwent physical therapy at that time with almost complete resolution of symptoms. However, patient was noted to have acute loss of ability to walk and left-sided facial droop yesterday (10/8) evening. His wife called 41 and he was brought to [facility]. Initially admitted overnight at [facility] with complaints of left facial droop and generalized weakness of the lower extremities. Patient was determined to be outside of TPA window with initial CT scan of the head negative for any acute process. Subsequent, MRI result received in evolution of old stroke and new occlusive vascular disease. Patient subsequently developed fever and diarrhea. Ua was positive for bacteria and patient was started on rocephin.    Assessment & Plan:   Principal Problem:   Gram-negative bacteremia Active Problems:   DM (diabetes mellitus) ([place])   Essential hypertension   AKI (acute kidney injury) ([place])   Benign essential HTN   CVA (cerebral vascular accident) ([place])   Diarrhea   Sepsis ([place])   CKD stage 3 secondary to diabetes ([place])   Ileocolitis  Ileocolitis and salmonella bacteremia - Sepsis resolved - Blood cultures drawn at [facility] on 10/9: positive for gram neg rods, Salmonella  - Urine culture at [facility] negative (confirmed with microlab) - Blood cultures 10/9: NGTD day 2 - C Diff negative   - CT abd/pelvis 10/11: Abnormal wall  thickening in the distal ileum, cecum, and ascending colon, suspicious for ileocolitis  - Spoke with Azucena Freed, GI PA. No further inpatient work up needed. Treat bacteremia and patient needs outpatient GI referral for future colonoscopy once acute episode resolves. Patient has never had a colonoscopy before.  - Continue IV Rocephin 2g daily  - ID following - Check HIV, hepatitis   Left-sided hemiparesis/ history of CVA: Patient with recurrence of left-sided weakness and symptoms of previous stroke. S/p right carotid CEA. Evaluated by neurology who suspected recrudescence of prior deficits. Recommended rehabilitation services as needed.  - PT/OT - Appreciate Neurology consultation  - Continue aspirin, lipitor   AKI on CKD stage III - Secondary to diarrhea - Resolved  - IVF  Hypokalemia - Replace - Monitor   Diabetes mellitus type 2, uncontrolled, with complication including CKD stage III: Hemoglobin A1c at outside facility noted to be 10.3 - Hold metformin  - Lantus and SSI, mealtime coverage   Essential Hypertension - Continue amlodipine, metoprolol  - Hold lisinopril and HCTZ in setting of AKI  DVT prophylaxis: lovenox Code Status: full Family Communication: wife at bedside  Disposition Plan: home health PT when stable and pending further treatment and clearance of bacteremia.    Consultants:   ID  Cardiology for TEE   Procedures:   None  Antimicrobials:    Rocephin    Subjective: Patient sitting in bed. Complains of some nausea, vomiting, right-sided abdominal pain. Drank liquid today but has no appetite. No fevers, chills, chest pain, shortness of breath. Flexiseal remains in place with large amount of  loose stool.   Objective: Vitals:   04/30/16 2120 05/01/16 0030 05/01/16 0445 05/01/16 0910  BP: (!) 126/53  (!) 164/71 (!) 155/68  Pulse: 73  84 82  Resp: 16  18 17   Temp: 97.9 F (36.6 C)  97.8 F (36.6 C) 97.8 F (36.6 C)  TempSrc: Oral  Oral  Oral  SpO2: 99%  97% 98%  Weight:      Height:  5' 5"  (1.651 m)      Intake/Output Summary (Last 24 hours) at 05/01/16 1128 Last data filed at 05/01/16 1036  Gross per 24 hour  Intake              360 ml  Output              570 ml  Net             -210 ml   Filed Weights   04/28/16 2052  Weight: 78.1 kg (172 lb 2.9 oz)    Examination:  General exam: Appears calm and comfortable  Respiratory system: Clear to auscultation. Respiratory effort normal. Cardiovascular system: S1 & S2 heard, RRR. No JVD, murmurs, rubs, gallops or clicks. No pedal edema. Gastrointestinal system: Abdomen is +distended, soft and +TTP RLQ. No organomegaly or masses felt. Normal bowel sounds heard. Flexiseal in place.  Central nervous system: Alert and oriented. No focal neurological deficits. No facial droop, no focal weakness.  Extremities: Symmetric 5 x 5 power. Skin: No rashes, lesions or ulcers Psychiatry: Judgement and insight appear normal. Mood & affect appropriate.   Data Reviewed: I have personally reviewed following labs and imaging studies  CBC:  Recent Labs Lab 04/28/16 0030 04/29/16 0415 04/30/16 0514 05/01/16 0426  WBC 5.9 4.9 5.3 6.1  NEUTROABS 4.9  --  2.9 3.5  HGB 13.5 13.6 13.1 11.9*  HCT 40.2 39.5 37.5* 35.0*  MCV 83.8 80.3 78.8 78.7  PLT 143* 127* 144* 450*   Basic Metabolic Panel:  Recent Labs Lab 04/28/16 0030 04/29/16 0415 04/30/16 0514 05/01/16 0426  NA 137 138 140 140  K 3.5 3.0* 3.5 2.7*  CL 101 104 106 107  CO2 22 22 23 25   GLUCOSE 225* 250* 234* 156*  BUN 32* 34* 25* 18  CREATININE 1.72* 1.45* 1.28* 1.16  CALCIUM 8.9 8.4* 8.4* 8.5*  MG  --  1.6* 1.9  --    GFR: Estimated Creatinine Clearance: 54.6 mL/min (by C-G formula based on SCr of 1.16 mg/dL). Liver Function Tests:  Recent Labs Lab 04/28/16 0030 04/29/16 0415  AST 38 42*  ALT 34 44  ALKPHOS 42 39  BILITOT 1.2 0.8  PROT 6.2* 6.0*  ALBUMIN 3.3* 2.9*   No results for input(s): LIPASE,  AMYLASE in the last 168 hours. No results for input(s): AMMONIA in the last 168 hours. Coagulation Profile: No results for input(s): INR, PROTIME in the last 168 hours. Cardiac Enzymes: No results for input(s): CKTOTAL, CKMB, CKMBINDEX, TROPONINI in the last 168 hours. BNP (last 3 results) No results for input(s): PROBNP in the last 8760 hours. HbA1C: No results for input(s): HGBA1C in the last 72 hours. CBG:  Recent Labs Lab 04/30/16 0813 04/30/16 1137 04/30/16 1629 04/30/16 2118 05/01/16 0715  GLUCAP 216* 246* 224* 119* 155*   Lipid Profile: No results for input(s): CHOL, HDL, LDLCALC, TRIG, CHOLHDL, LDLDIRECT in the last 72 hours. Thyroid Function Tests: No results for input(s): TSH, T4TOTAL, FREET4, T3FREE, THYROIDAB in the last 72 hours. Anemia Panel: No results for input(s): VITAMINB12,  FOLATE, FERRITIN, TIBC, IRON, RETICCTPCT in the last 72 hours. Sepsis Labs:  Recent Labs Lab 04/28/16 0031 04/28/16 0341  LATICACIDVEN 1.4 1.6    Recent Results (from the past 240 hour(s))  Culture, blood (routine x 2)     Status: None (Preliminary result)   Collection Time: 04/28/16 12:31 AM  Result Value Ref Range Status   Specimen Description BLOOD RIGHT ARM  Final   Special Requests AEROBIC BOTTLE ONLY 5ML  Final   Culture NO GROWTH 2 DAYS  Final   Report Status PENDING  Incomplete  Culture, blood (routine x 2)     Status: None (Preliminary result)   Collection Time: 04/28/16 12:46 AM  Result Value Ref Range Status   Specimen Description BLOOD LEFT HAND  Final   Special Requests BOTTLES DRAWN AEROBIC AND ANAEROBIC 5ML  Final   Culture NO GROWTH 2 DAYS  Final   Report Status PENDING  Incomplete  C difficile quick scan w PCR reflex     Status: None   Collection Time: 04/28/16  1:37 AM  Result Value Ref Range Status   C Diff antigen NEGATIVE NEGATIVE Final   C Diff toxin NEGATIVE NEGATIVE Final   C Diff interpretation No C. difficile detected.  Final       Radiology  Studies: Ct Abdomen Pelvis W Contrast  Result Date: 04/29/2016 CLINICAL DATA:  Diarrhea for 1 month. G negative bacteremia. Nausea and vomiting. EXAM: CT ABDOMEN AND PELVIS WITH CONTRAST TECHNIQUE: Multidetector CT imaging of the abdomen and pelvis was performed using the standard protocol following bolus administration of intravenous contrast. CONTRAST:  177m ISOVUE-300 IOPAMIDOL (ISOVUE-300) INJECTION 61% COMPARISON:  11/28/2015 FINDINGS: Lower chest: Aortic valve prosthesis.  Coronary atherosclerosis. Hepatobiliary: Mildly contracted gallbladder. Pancreas: Unremarkable Spleen: Unremarkable Adrenals/Urinary Tract: Hypodense 6 mm lesion of the right kidney lower pole, nonspecific due to small size, image 27/301. Stomach/Bowel: Abnormal wall thickening in the distal ileum extending to the ileocecal valve with potential wall thickening and smoothing of the wall contour in the cecum and ascending colon. Air-fluid levels in the distal colon favoring diarrheal process. Flexi-seal fecal collector device in the rectum. Vascular/Lymphatic: Aortoiliac atherosclerotic vascular disease. There is notable atheromatous narrowing in the left renal artery. Small gastrohepatic lymph nodes are not pathologically enlarged by size criteria. Reproductive: Calcifications along the posterior portion of the central zone of the prostate gland. Other: No supplemental non-categorized findings. Musculoskeletal: Mild degenerative arthropathy of both hips. Lumbar spondylosis and degenerative disc disease causing suspected impingement at the L3-4 level. Small umbilical hernia contains adipose tissue. IMPRESSION: 1. Abnormal wall thickening in the distal ileum, cecum, and ascending colon, suspicious for ileocolitis. This could be infectious or due to Crohn's disease. 2. Extensive atherosclerosis, including the coronary arteries. Suspected high-grade stenosis in the left renal artery. 3. Nonspecific 6 mm hypodense lesion in the right kidney  lower pole, likely a cyst but technically nonspecific due to small size. 4. Scattered air-fluid levels in the distal colon favoring diarrheal process. 5. Probable impingement at the L3-4 level due to spondylosis and degenerative disc disease. Electronically Signed   By: WVan ClinesM.D.   On: 04/29/2016 15:00      Scheduled Meds: . amLODipine  10 mg Oral Daily  . aspirin EC  81 mg Oral Daily  . atorvastatin  40 mg Oral Daily  . cefTRIAXone (ROCEPHIN)  IV  2 g Intravenous Q24H  . enoxaparin (LOVENOX) injection  40 mg Subcutaneous Q24H  . insulin aspart  0-5 Units Subcutaneous QHS  .  insulin aspart  0-9 Units Subcutaneous TID WC  . insulin aspart  3 Units Subcutaneous TID WC  . insulin glargine  12 Units Subcutaneous Daily  . loratadine  10 mg Oral Daily  . metoprolol succinate  100 mg Oral Daily  . potassium chloride  40 mEq Oral Q4H  . vitamin B-6  250 mg Oral Daily  . vitamin C  500 mg Oral Daily   Continuous Infusions:     LOS: 4 days    Time spent: 30 minutes    Dessa Phi, DO [facility] Pager [phone]  If 7PM-7AM, please contact night-coverage [email or web address] Password [email or web address] 05/01/2016, 11:28 AM

## 2016-05-01 NOTE — Progress Notes (Signed)
PT Cancellation Note  Patient Details Name: Evan Stanley MRN: [id number] DOB: Nov 30, 1940   Cancelled Treatment:    Reason Eval/Treat Not Completed: Patient declined, no reason specified.  Patient declined PT today.  Provided max encouragement to get OOB.  Patient reports still having diarrhea and "I have 2 tubes".  Declined ambulation and exercises in bed.  Will continue to attempt to work with patient.   Despina Pole 05/01/2016, 4:58 PM Carita Pian. Sanjuana Kava, [place] Pager [phone]

## 2016-05-02 LAB — CBC WITH DIFFERENTIAL/PLATELET
BASOS ABS: 0.1 10*3/uL (ref 0.0–0.1)
Basophils Relative: 1 %
EOS PCT: 2 %
Eosinophils Absolute: 0.2 10*3/uL (ref 0.0–0.7)
HEMATOCRIT: 35.9 % — AB (ref 39.0–52.0)
HEMOGLOBIN: 12.4 g/dL — AB (ref 13.0–17.0)
LYMPHS ABS: 1.9 10*3/uL (ref 0.7–4.0)
LYMPHS PCT: 23 %
MCH: 27.2 pg (ref 26.0–34.0)
MCHC: 34.5 g/dL (ref 30.0–36.0)
MCV: 78.7 fL (ref 78.0–100.0)
MONOS PCT: 13 %
Monocytes Absolute: 1.1 10*3/uL — ABNORMAL HIGH (ref 0.1–1.0)
Neutro Abs: 4.8 10*3/uL (ref 1.7–7.7)
Neutrophils Relative %: 61 %
PLATELETS: 152 10*3/uL (ref 150–400)
RBC: 4.56 MIL/uL (ref 4.22–5.81)
RDW: 13.5 % (ref 11.5–15.5)
WBC: 8.1 10*3/uL (ref 4.0–10.5)

## 2016-05-02 LAB — BASIC METABOLIC PANEL
Anion gap: 10 (ref 5–15)
BUN: 16 mg/dL (ref 6–20)
CALCIUM: 8.4 mg/dL — AB (ref 8.9–10.3)
CO2: 25 mmol/L (ref 22–32)
CREATININE: 1.04 mg/dL (ref 0.61–1.24)
Chloride: 104 mmol/L (ref 101–111)
GFR calc Af Amer: >60 mL/min (ref >60)
Glucose, Bld: 151 mg/dL — ABNORMAL HIGH (ref 65–99)
Potassium: 3.2 mmol/L — ABNORMAL LOW (ref 3.5–5.1)
SODIUM: 139 mmol/L (ref 135–145)

## 2016-05-02 LAB — GLUCOSE, CAPILLARY
GLUCOSE-CAPILLARY: 152 mg/dL — AB (ref 65–99)
GLUCOSE-CAPILLARY: 81 mg/dL (ref 65–99)
GLUCOSE-CAPILLARY: 150 mg/dL — AB (ref 65–99)
Glucose-Capillary: 186 mg/dL — ABNORMAL HIGH (ref 65–99)

## 2016-05-02 LAB — HEPATITIS PANEL, ACUTE
HCV Ab: <0.1 s/co ratio (ref 0.0–0.9)
HEP B C IGM: NEGATIVE
HEP B S AG: NEGATIVE
Hep A IgM: NEGATIVE

## 2016-05-02 LAB — MAGNESIUM: MAGNESIUM: 1.9 mg/dL (ref 1.7–2.4)

## 2016-05-02 MED ORDER — POTASSIUM CHLORIDE CRYS ER 20 MEQ PO TBCR: 40.0000 meq | EXTENDED_RELEASE_TABLET | ORAL | Status: DC

## 2016-05-02 MED ORDER — POTASSIUM CHLORIDE CRYS ER 20 MEQ PO TBCR
40.0000 meq | EXTENDED_RELEASE_TABLET | ORAL | Status: AC
  Administered 2016-05-02 (×2): 40 meq via ORAL
  Filled 2016-05-02 (×2): qty 2

## 2016-05-02 NOTE — Progress Notes (Signed)
PROGRESS NOTE    Evan Stanley  ZCH:[id number] DOB: 11/04/1940 DOA: 04/27/2016 PCP: Evan Honour, MD     Brief Narrative:  Evan Stanley is a 75 y.o. male with medical history significant of HTN, HLD, DM, A. Fib, CVA, and R. CEA on 12/2015; who presents with complaints of left-sided facial droop and weakness. History is obtained mostly from Evan Stanley. Patient had previously had a stroke after a CT angiogram to evaluate his carotids in March of 2017 with left-sided weakness. Patient subsequently underwent a right carotid artery endarterectomy in June. He underwent physical therapy at that time with almost complete resolution of symptoms. However, patient was noted to have acute loss of ability to walk and left-sided facial droop yesterday (10/8) evening. His Stanley called 90 and he was brought to [facility]. Initially admitted overnight at [facility] with complaints of left facial droop and generalized weakness of Evan lower extremities. Patient was determined to be outside of TPA window with initial CT scan of Evan head negative for any acute process. Subsequent, MRI result received in evolution of old stroke and new occlusive vascular disease. Patient subsequently developed fever and diarrhea. Ua was positive for bacteria and patient was started on rocephin.    Assessment & Plan:   Principal Problem:   Salmonella bacteremia Active Problems:   DM (diabetes mellitus) ([place])   Essential hypertension   AKI (acute kidney injury) ([place])   Benign essential HTN   CVA (cerebral vascular accident) ([place])   Diarrhea   Sepsis ([place])   CKD stage 3 secondary to diabetes ([place])   Ileocolitis  Ileocolitis and salmonella bacteremia - Sepsis resolved - Blood cultures drawn at [facility] on 10/9: positive for gram neg rods, Salmonella  - Urine culture at Evan Stanley negative (confirmed with microlab) - Blood cultures 10/9: NGTD day 2 - C Diff negative   - CT abd/pelvis 10/11: Abnormal wall  thickening in Evan distal ileum, cecum, and ascending colon, suspicious for ileocolitis  - Spoke with Azucena Freed, GI PA. No further inpatient work up needed. Treat bacteremia and patient needs outpatient GI referral for future colonoscopy once acute episode resolves. Patient has never had a colonoscopy before.  - HIV, hepatitis - Negative  - Hope to remove flexiseal with decrease in BM  - Continue IV Rocephin 2g daily  - ID following - TEE planned for next week   Left-sided hemiparesis/ history of CVA: Patient with recurrence of left-sided weakness and symptoms of previous stroke. S/p right carotid CEA. Evaluated by neurology who suspected recrudescence of prior deficits. Recommended rehabilitation services as needed.  - Appreciate Neurology consultation  - Continue aspirin, lipitor  - PT/OT  AKI on CKD stage III - Secondary to diarrhea - Resolved   Hypokalemia - Replace  - Repeat BMP   Diabetes mellitus type 2, uncontrolled, with complication including CKD stage III: Hemoglobin A1c at outside facility noted to be 10.3 - Hold metformin  - Lantus and SSI, mealtime coverage   Essential Hypertension - Continue amlodipine, metoprolol  - Hold lisinopril and HCTZ in setting of AKI  DVT prophylaxis: lovenox Code Status: full Family Communication: daughter in law at bedside  Disposition Plan: home health PT when stable and pending further treatment and clearance of bacteremia.    Consultants:   ID  Cardiology for TEE   Procedures:   None  Antimicrobials:    Rocephin    Subjective: Patient sitting in bed. He states that his nausea, vomiting, abdominal pain has resolved today.  Has been tolerating ice cream, pudding. Denies any fevers, chills, chest pain, shortness of breath.  Objective: Vitals:   05/01/16 1718 05/01/16 2117 05/02/16 0518 05/02/16 0521  BP: 135/61 133/61 (!) 161/66 (!) 149/65  Pulse: 70 70 72   Resp: 18 18 16    Temp: 98.2 F (36.8 C) 99.1 F  (37.3 C) 97.7 F (36.5 C)   TempSrc: Oral Oral Oral   SpO2: 99% 99% 100%   Weight:  77.7 kg (171 lb 3.2 oz)    Height:        Intake/Output Summary (Last 24 hours) at 05/02/16 1046 Last data filed at 05/02/16 0522  Gross per 24 hour  Intake              240 ml  Output             1800 ml  Net            -1560 ml   Filed Weights   04/28/16 2052 05/01/16 2117  Weight: 78.1 kg (172 lb 2.9 oz) 77.7 kg (171 lb 3.2 oz)    Examination:  General exam: Appears calm and comfortable  Respiratory system: Clear to auscultation. Respiratory effort normal. Cardiovascular system: S1 & S2 heard, RRR. No JVD, murmurs, rubs, gallops or clicks. No pedal edema. Gastrointestinal system: Abdomen is soft and nonTTP. No organomegaly or masses felt. Normal bowel sounds heard. Flexiseal in place.  Central nervous system: Alert and oriented. No focal neurological deficits. No facial droop, no focal weakness.  Extremities: Symmetric 5 x 5 power. Skin: No rashes, lesions or ulcers Psychiatry: Judgement and insight appear normal. Mood & affect appropriate.   Data Reviewed: I have personally reviewed following labs and imaging studies  CBC:  Recent Labs Lab 04/28/16 0030 04/29/16 0415 04/30/16 0514 05/01/16 0426 05/02/16 0629  WBC 5.9 4.9 5.3 6.1 8.1  NEUTROABS 4.9  --  2.9 3.5 4.8  HGB 13.5 13.6 13.1 11.9* 12.4*  HCT 40.2 39.5 37.5* 35.0* 35.9*  MCV 83.8 80.3 78.8 78.7 78.7  PLT 143* 127* 144* 142* 263   Basic Metabolic Panel:  Recent Labs Lab 04/28/16 0030 04/29/16 0415 04/30/16 0514 05/01/16 0426 05/01/16 2010 05/02/16 0629  NA 137 138 140 140  --  139  K 3.5 3.0* 3.5 2.7* 3.4* 3.2*  CL 101 104 106 107  --  104  CO2 22 22 23 25   --  25  GLUCOSE 225* 250* 234* 156*  --  151*  BUN 32* 34* 25* 18  --  16  CREATININE 1.72* 1.45* 1.28* 1.16  --  1.04  CALCIUM 8.9 8.4* 8.4* 8.5*  --  8.4*  MG  --  1.6* 1.9  --   --  1.9   GFR: Estimated Creatinine Clearance: 60.8 mL/min (by C-G  formula based on SCr of 1.04 mg/dL). Liver Function Tests:  Recent Labs Lab 04/28/16 0030 04/29/16 0415  AST 38 42*  ALT 34 44  ALKPHOS 42 39  BILITOT 1.2 0.8  PROT 6.2* 6.0*  ALBUMIN 3.3* 2.9*   No results for input(s): LIPASE, AMYLASE in Evan last 168 hours. No results for input(s): AMMONIA in Evan last 168 hours. Coagulation Profile: No results for input(s): INR, PROTIME in Evan last 168 hours. Cardiac Enzymes: No results for input(s): CKTOTAL, CKMB, CKMBINDEX, TROPONINI in Evan last 168 hours. BNP (last 3 results) No results for input(s): PROBNP in Evan last 8760 hours. HbA1C: No results for input(s): HGBA1C in Evan last 72 hours. CBG:  Recent Labs Lab 05/01/16 0715 05/01/16 1153 05/01/16 1713 05/01/16 2114 05/02/16 0725  GLUCAP 155* 192* 128* 100* 152*   Lipid Profile: No results for input(s): CHOL, HDL, LDLCALC, TRIG, CHOLHDL, LDLDIRECT in Evan last 72 hours. Thyroid Function Tests: No results for input(s): TSH, T4TOTAL, FREET4, T3FREE, THYROIDAB in Evan last 72 hours. Anemia Panel: No results for input(s): VITAMINB12, FOLATE, FERRITIN, TIBC, IRON, RETICCTPCT in Evan last 72 hours. Sepsis Labs:  Recent Labs Lab 04/28/16 0031 04/28/16 0341  LATICACIDVEN 1.4 1.6    Recent Results (from Evan past 240 hour(s))  Culture, blood (routine x 2)     Status: None (Preliminary result)   Collection Time: 04/28/16 12:31 AM  Result Value Ref Range Status   Specimen Description BLOOD RIGHT ARM  Final   Special Requests AEROBIC BOTTLE ONLY 5ML  Final   Culture NO GROWTH 3 DAYS  Final   Report Status PENDING  Incomplete  Culture, blood (routine x 2)     Status: None (Preliminary result)   Collection Time: 04/28/16 12:46 AM  Result Value Ref Range Status   Specimen Description BLOOD LEFT HAND  Final   Special Requests BOTTLES DRAWN AEROBIC AND ANAEROBIC 5ML  Final   Culture NO GROWTH 3 DAYS  Final   Report Status PENDING  Incomplete  C difficile quick scan w PCR reflex      Status: None   Collection Time: 04/28/16  1:37 AM  Result Value Ref Range Status   C Diff antigen NEGATIVE NEGATIVE Final   C Diff toxin NEGATIVE NEGATIVE Final   C Diff interpretation No C. difficile detected.  Final       Radiology Studies: No results found.    Scheduled Meds: . amLODipine  10 mg Oral Daily  . aspirin EC  81 mg Oral Daily  . atorvastatin  40 mg Oral Daily  . cefTRIAXone (ROCEPHIN)  IV  2 g Intravenous Q24H  . enoxaparin (LOVENOX) injection  40 mg Subcutaneous Q24H  . insulin aspart  0-5 Units Subcutaneous QHS  . insulin aspart  0-9 Units Subcutaneous TID WC  . insulin aspart  3 Units Subcutaneous TID WC  . insulin glargine  12 Units Subcutaneous Daily  . loratadine  10 mg Oral Daily  . metoprolol succinate  100 mg Oral Daily  . potassium chloride  40 mEq Oral Q4H  . vitamin B-6  250 mg Oral Daily  . vitamin C  500 mg Oral Daily   Continuous Infusions:     LOS: 5 days    Time spent: 30 minutes    Dessa Phi, DO [facility] Pager [phone]  If 7PM-7AM, please contact night-coverage [email or web address] Password [email or web address] 05/02/2016, 10:46 AM

## 2016-05-03 LAB — BASIC METABOLIC PANEL
Anion gap: 7 (ref 5–15)
BUN: 16 mg/dL (ref 6–20)
CO2: 27 mmol/L (ref 22–32)
Calcium: 8.5 mg/dL — ABNORMAL LOW (ref 8.9–10.3)
Chloride: 105 mmol/L (ref 101–111)
Creatinine, Ser: 1.09 mg/dL (ref 0.61–1.24)
GFR calc Af Amer: >60 mL/min (ref >60)
GLUCOSE: 134 mg/dL — AB (ref 65–99)
POTASSIUM: 3.5 mmol/L (ref 3.5–5.1)
Sodium: 139 mmol/L (ref 135–145)

## 2016-05-03 LAB — GLUCOSE, CAPILLARY
GLUCOSE-CAPILLARY: 77 mg/dL (ref 65–99)
Glucose-Capillary: 138 mg/dL — ABNORMAL HIGH (ref 65–99)
Glucose-Capillary: 199 mg/dL — ABNORMAL HIGH (ref 65–99)
Glucose-Capillary: 157 mg/dL — ABNORMAL HIGH (ref 65–99)

## 2016-05-03 LAB — CULTURE, BLOOD (ROUTINE X 2)
CULTURE: NO GROWTH
Culture: NO GROWTH

## 2016-05-03 MED ORDER — POTASSIUM CHLORIDE CRYS ER 20 MEQ PO TBCR
40.0000 meq | EXTENDED_RELEASE_TABLET | Freq: Once | ORAL | Status: AC
  Administered 2016-05-03: 40 meq via ORAL
  Filled 2016-05-03: qty 2

## 2016-05-03 NOTE — Progress Notes (Signed)
PROGRESS NOTE    Evan Stanley  XBL:[id number] DOB: May 04, 1941 DOA: 04/27/2016 PCP: Wardell Honour, MD     Brief Narrative:  Evan Stanley is a 75 y.o. male with medical history significant of HTN, HLD, DM, A. Fib, CVA, and R. CEA on 12/2015; who presents with complaints of left-sided facial droop and weakness. History is obtained mostly from the patient's wife. Patient had previously had a stroke after a CT angiogram to evaluate his carotids in March of 2017 with left-sided weakness. Patient subsequently underwent a right carotid artery endarterectomy in June. He underwent physical therapy at that time with almost complete resolution of symptoms. However, patient was noted to have acute loss of ability to walk and left-sided facial droop yesterday (10/8) evening. His wife called 28 and he was brought to [facility]. Initially admitted overnight at [facility] with complaints of left facial droop and generalized weakness of the lower extremities. Patient was determined to be outside of TPA window with initial CT scan of the head negative for any acute process. Subsequent, MRI result received in evolution of old stroke and new occlusive vascular disease. Patient subsequently developed fever and diarrhea. Blood culture results in salmonella bacteremia.   Assessment & Plan:   Principal Problem:   Salmonella bacteremia Active Problems:   DM (diabetes mellitus) ([place])   Essential hypertension   AKI (acute kidney injury) ([place])   Benign essential HTN   CVA (cerebral vascular accident) ([place])   Diarrhea   Sepsis ([place])   CKD stage 3 secondary to diabetes ([place])   Ileocolitis  Ileocolitis and salmonella bacteremia - Sepsis resolved - Blood cultures drawn at [facility] on 10/9: positive for gram neg rods, Salmonella  - Urine culture at [facility] negative (confirmed with microlab) - Blood cultures 10/9: NGTD day 2 - C Diff negative   - CT abd/pelvis 10/11: Abnormal wall thickening in the  distal ileum, cecum, and ascending colon, suspicious for ileocolitis  - Spoke with Azucena Freed, GI PA. No further inpatient work up needed. Treat bacteremia and patient needs outpatient GI referral for future colonoscopy once acute episode resolves. Patient has never had a colonoscopy before.  - HIV, hepatitis - Negative  - Hope to remove flexiseal with decrease in BM  - Continue IV Rocephin 2g daily  - ID following - TEE planned for next week   Left-sided hemiparesis/ history of CVA: Patient with recurrence of left-sided weakness and symptoms of previous stroke. S/p right carotid CEA. Evaluated by neurology who suspected recrudescence of prior deficits. Recommended rehabilitation services as needed.  - Appreciate Neurology consultation  - Continue aspirin, lipitor  - PT/OT  AKI on CKD stage III - Secondary to diarrhea - Resolved   Hypokalemia - Replace  - Repeat BMP   Diabetes mellitus type 2, uncontrolled, with complication including CKD stage III: Hemoglobin A1c at outside facility noted to be 10.3 - Hold metformin  - Lantus and SSI, mealtime coverage   Essential Hypertension - Continue amlodipine, metoprolol  - Hold lisinopril and HCTZ in setting of AKI  DVT prophylaxis: lovenox Code Status: full Family Communication: wife at bedside  Disposition Plan: home health PT when stable and pending further treatment and clearance of bacteremia.    Consultants:   ID  Cardiology for TEE   Procedures:   None  Antimicrobials:    Rocephin    Subjective: Patient sitting in bed. He has no complaints this morning. Tolerated beef stew last night and pancakes this morning. No nausea, vomiting or  abdominal pain. He has been afebrile, no chills, no chest pain, no shortness of breath. Flexiseal is still in place and had 1400 mL output yesterday. Hoping that this slows down so that we can eventually remove flexiseal and that he can work with physical therapy. Awaiting  TEE.  Objective: Vitals:   05/02/16 1705 05/02/16 2106 05/03/16 0700 05/03/16 0950  BP: 119/70 (!) 143/63 (!) 153/70 (!) 144/57  Pulse: 69 65 67 70  Resp: 20 18 17 18   Temp: 97.9 F (36.6 C) 98.6 F (37 C) 98.8 F (37.1 C) 97.3 F (36.3 C)  TempSrc: Oral Oral Oral Oral  SpO2: 100% 99% 98% 98%  Weight:  77.2 kg (170 lb 3.1 oz)    Height:        Intake/Output Summary (Last 24 hours) at 05/03/16 1143 Last data filed at 05/03/16 0900  Gross per 24 hour  Intake              650 ml  Output             2300 ml  Net            -1650 ml   Filed Weights   04/28/16 2052 05/01/16 2117 05/02/16 2106  Weight: 78.1 kg (172 lb 2.9 oz) 77.7 kg (171 lb 3.2 oz) 77.2 kg (170 lb 3.1 oz)    Examination:  General exam: Appears calm and comfortable  Respiratory system: Clear to auscultation. Respiratory effort normal. Cardiovascular system: S1 & S2 heard, RRR. No JVD, murmurs, rubs, gallops or clicks. No pedal edema. Gastrointestinal system: Abdomen is soft and nonTTP. No organomegaly or masses felt. Normal bowel sounds heard. Flexiseal in place.  Central nervous system: Alert and oriented. No focal neurological deficits. No facial droop, no focal weakness.  Extremities: Symmetric 5 x 5 power. Skin: No rashes, lesions or ulcers Psychiatry: Judgement and insight appear normal. Mood & affect appropriate.   Data Reviewed: I have personally reviewed following labs and imaging studies  CBC:  Recent Labs Lab 04/28/16 0030 04/29/16 0415 04/30/16 0514 05/01/16 0426 05/02/16 0629  WBC 5.9 4.9 5.3 6.1 8.1  NEUTROABS 4.9  --  2.9 3.5 4.8  HGB 13.5 13.6 13.1 11.9* 12.4*  HCT 40.2 39.5 37.5* 35.0* 35.9*  MCV 83.8 80.3 78.8 78.7 78.7  PLT 143* 127* 144* 142* 791   Basic Metabolic Panel:  Recent Labs Lab 04/29/16 0415 04/30/16 0514 05/01/16 0426 05/01/16 2010 05/02/16 0629 05/03/16 0603  NA 138 140 140  --  139 139  K 3.0* 3.5 2.7* 3.4* 3.2* 3.5  CL 104 106 107  --  104 105  CO2 22  23 25   --  25 27  GLUCOSE 250* 234* 156*  --  151* 134*  BUN 34* 25* 18  --  16 16  CREATININE 1.45* 1.28* 1.16  --  1.04 1.09  CALCIUM 8.4* 8.4* 8.5*  --  8.4* 8.5*  MG 1.6* 1.9  --   --  1.9  --    GFR: Estimated Creatinine Clearance: 57.9 mL/min (by C-G formula based on SCr of 1.09 mg/dL). Liver Function Tests:  Recent Labs Lab 04/28/16 0030 04/29/16 0415  AST 38 42*  ALT 34 44  ALKPHOS 42 39  BILITOT 1.2 0.8  PROT 6.2* 6.0*  ALBUMIN 3.3* 2.9*   No results for input(s): LIPASE, AMYLASE in the last 168 hours. No results for input(s): AMMONIA in the last 168 hours. Coagulation Profile: No results for input(s): INR, PROTIME in the  last 168 hours. Cardiac Enzymes: No results for input(s): CKTOTAL, CKMB, CKMBINDEX, TROPONINI in the last 168 hours. BNP (last 3 results) No results for input(s): PROBNP in the last 8760 hours. HbA1C: No results for input(s): HGBA1C in the last 72 hours. CBG:  Recent Labs Lab 05/02/16 0725 05/02/16 1135 05/02/16 1630 05/02/16 2146 05/03/16 0735  GLUCAP 152* 186* 81 150* 138*   Lipid Profile: No results for input(s): CHOL, HDL, LDLCALC, TRIG, CHOLHDL, LDLDIRECT in the last 72 hours. Thyroid Function Tests: No results for input(s): TSH, T4TOTAL, FREET4, T3FREE, THYROIDAB in the last 72 hours. Anemia Panel: No results for input(s): VITAMINB12, FOLATE, FERRITIN, TIBC, IRON, RETICCTPCT in the last 72 hours. Sepsis Labs:  Recent Labs Lab 04/28/16 0031 04/28/16 0341  LATICACIDVEN 1.4 1.6    Recent Results (from the past 240 hour(s))  Culture, blood (routine x 2)     Status: None (Preliminary result)   Collection Time: 04/28/16 12:31 AM  Result Value Ref Range Status   Specimen Description BLOOD RIGHT ARM  Final   Special Requests AEROBIC BOTTLE ONLY 5ML  Final   Culture NO GROWTH 4 DAYS  Final   Report Status PENDING  Incomplete  Culture, blood (routine x 2)     Status: None (Preliminary result)   Collection Time: 04/28/16 12:46  AM  Result Value Ref Range Status   Specimen Description BLOOD LEFT HAND  Final   Special Requests BOTTLES DRAWN AEROBIC AND ANAEROBIC 5ML  Final   Culture NO GROWTH 4 DAYS  Final   Report Status PENDING  Incomplete  C difficile quick scan w PCR reflex     Status: None   Collection Time: 04/28/16  1:37 AM  Result Value Ref Range Status   C Diff antigen NEGATIVE NEGATIVE Final   C Diff toxin NEGATIVE NEGATIVE Final   C Diff interpretation No C. difficile detected.  Final       Radiology Studies: No results found.    Scheduled Meds: . amLODipine  10 mg Oral Daily  . aspirin EC  81 mg Oral Daily  . atorvastatin  40 mg Oral Daily  . cefTRIAXone (ROCEPHIN)  IV  2 g Intravenous Q24H  . enoxaparin (LOVENOX) injection  40 mg Subcutaneous Q24H  . insulin aspart  0-5 Units Subcutaneous QHS  . insulin aspart  0-9 Units Subcutaneous TID WC  . insulin aspart  3 Units Subcutaneous TID WC  . insulin glargine  12 Units Subcutaneous Daily  . loratadine  10 mg Oral Daily  . metoprolol succinate  100 mg Oral Daily  . potassium chloride  40 mEq Oral Once  . vitamin B-6  250 mg Oral Daily  . vitamin C  500 mg Oral Daily   Continuous Infusions:     LOS: 6 days    Time spent: 25 minutes   Dessa Phi, DO [facility] [email or web address] Password [email or web address] 05/03/2016, 11:43 AM

## 2016-05-04 DIAGNOSIS — Z954 Presence of other heart-valve replacement: Secondary | ICD-10-CM

## 2016-05-04 DIAGNOSIS — I639 Cerebral infarction, unspecified: Secondary | ICD-10-CM

## 2016-05-04 LAB — BASIC METABOLIC PANEL
Anion gap: 6 (ref 5–15)
BUN: 15 mg/dL (ref 6–20)
CHLORIDE: 104 mmol/L (ref 101–111)
CO2: 28 mmol/L (ref 22–32)
CREATININE: 1.15 mg/dL (ref 0.61–1.24)
Calcium: 8.6 mg/dL — ABNORMAL LOW (ref 8.9–10.3)
GFR calc Af Amer: >60 mL/min (ref >60)
GFR calc non Af Amer: >60 mL/min (ref >60)
GLUCOSE: 195 mg/dL — AB (ref 65–99)
Potassium: 3.5 mmol/L (ref 3.5–5.1)
SODIUM: 138 mmol/L (ref 135–145)

## 2016-05-04 LAB — GLUCOSE, CAPILLARY
GLUCOSE-CAPILLARY: 184 mg/dL — AB (ref 65–99)
GLUCOSE-CAPILLARY: 233 mg/dL — AB (ref 65–99)
Glucose-Capillary: 213 mg/dL — ABNORMAL HIGH (ref 65–99)
Glucose-Capillary: 267 mg/dL — ABNORMAL HIGH (ref 65–99)

## 2016-05-04 NOTE — Progress Notes (Signed)
    [facility] has been requested to perform a transesophageal echocardiogram on Evan Stanley for bacteremia with bioprosthetic AVR.  After careful review of history and examination, the risks and benefits of transesophageal echocardiogram have been explained including risks of esophageal damage, perforation (1:10,000 risk), bleeding, pharyngeal hematoma as well as other potential complications associated with conscious sedation including aspiration, arrhythmia, respiratory failure and death. Alternatives to treatment were discussed, questions were answered. Patient is willing to proceed.   Cecilie Kicks, NP  05/04/2016 3:21 PM

## 2016-05-04 NOTE — Progress Notes (Signed)
PROGRESS NOTE    Evan Stanley  ULA:[id number] DOB: 18-Apr-1941 DOA: 04/27/2016 PCP: Wardell Honour, MD     Brief Narrative:  Evan Stanley is a 75 y.o. male with medical history significant of HTN, HLD, DM, A. Fib, CVA, and R. CEA on 12/2015; who presents with complaints of left-sided facial droop and weakness. History is obtained mostly from the patient's wife. Patient had previously had a stroke after a CT angiogram to evaluate his carotids in March of 2017 with left-sided weakness. Patient subsequently underwent a right carotid artery endarterectomy in June. He underwent physical therapy at that time with almost complete resolution of symptoms. However, patient was noted to have acute loss of ability to walk and left-sided facial droop yesterday (10/8) evening. His wife called 50 and he was brought to [facility]. Initially admitted overnight at [facility] with complaints of left facial droop and generalized weakness of the lower extremities. Patient was determined to be outside of TPA window with initial CT scan of the head negative for any acute process. Subsequent, MRI result received in evolution of old stroke and new occlusive vascular disease. Patient subsequently developed fever and diarrhea. Blood culture results in salmonella bacteremia.   Assessment & Plan:   Principal Problem:   Salmonella bacteremia Active Problems:   DM (diabetes mellitus) ([place])   Essential hypertension   AKI (acute kidney injury) ([place])   Benign essential HTN   CVA (cerebral vascular accident) ([place])   Diarrhea   Sepsis ([place])   CKD stage 3 secondary to diabetes ([place])   Ileocolitis  Ileocolitis and salmonella bacteremia - Sepsis resolved - Blood cultures drawn at [facility] on 10/9: positive for gram neg rods, Salmonella  - Urine culture at [facility] negative (confirmed with microlab) - Blood cultures 10/9: NGTD day 2 - C Diff negative   - CT abd/pelvis 10/11: Abnormal wall thickening in the  distal ileum, cecum, and ascending colon, suspicious for ileocolitis  - Spoke with Azucena Freed, GI PA. No further inpatient work up needed. Treat bacteremia and patient needs outpatient GI referral for future colonoscopy once acute episode resolves. Patient has never had a colonoscopy before.  - HIV, hepatitis - Negative  - Continue IV Rocephin 2g daily  - ID following - Remove flexiseal, remove foley, bedside commode  - TEE planned for tomorrow   Left-sided hemiparesis/ history of CVA: Patient with recurrence of left-sided weakness and symptoms of previous stroke. S/p right carotid CEA. Evaluated by neurology who suspected recrudescence of prior deficits. Recommended rehabilitation services as needed.  - Appreciate Neurology consultation  - Continue aspirin, lipitor  - PT/OT  AKI on CKD stage III - Secondary to diarrhea - Resolved   Hypokalemia - Replace   Diabetes mellitus type 2, uncontrolled, with complication including CKD stage III: Hemoglobin A1c at outside facility noted to be 10.3 - Hold metformin  - Lantus and SSI, mealtime coverage   Essential Hypertension - Continue amlodipine, metoprolol  - Hold lisinopril and HCTZ in setting of AKI  DVT prophylaxis: lovenox Code Status: full Family Communication: wife at bedside  Disposition Plan: home health PT when stable and pending further treatment and clearance of bacteremia.    Consultants:   ID  Cardiology for TEE   Procedures:   None  Antimicrobials:    Rocephin    Subjective: Patient doing well. He has no specific complaints this morning; denies any fevers, chest pain, cough, shortness of breath, nausea, vomiting, abdominal pain, dysuria. Patient is tolerating meal.  Objective: Vitals:   05/03/16 1720 05/03/16 2245 05/04/16 0704 05/04/16 1014  BP: 138/60 (!) 141/67 137/79 132/72  Pulse: 72 66 64 67  Resp: 18 16 16 16   Temp: 98 F (36.7 C) 98.1 F (36.7 C) 98.3 F (36.8 C) 98.3 F (36.8 C)    TempSrc: Oral Oral Oral Oral  SpO2: 98% 98% 98% 99%  Weight:      Height:        Intake/Output Summary (Last 24 hours) at 05/04/16 1027 Last data filed at 05/04/16 0700  Gross per 24 hour  Intake              530 ml  Output              375 ml  Net              155 ml   Filed Weights   04/28/16 2052 05/01/16 2117 05/02/16 2106  Weight: 78.1 kg (172 lb 2.9 oz) 77.7 kg (171 lb 3.2 oz) 77.2 kg (170 lb 3.1 oz)    Examination:  General exam: Appears calm and comfortable  Respiratory system: Clear to auscultation. Respiratory effort normal. Cardiovascular system: S1 & S2 heard, RRR. No JVD, murmurs, rubs, gallops or clicks. No pedal edema. Gastrointestinal system: Abdomen is soft and nonTTP. No organomegaly or masses felt. Normal bowel sounds heard. Flexiseal in place.   Central nervous system: Alert and oriented. No focal neurological deficits. No facial droop, no focal weakness.  Extremities: Symmetric 5 x 5 power. Skin: No rashes, lesions or ulcers Psychiatry: Judgement and insight appear normal. Mood & affect appropriate.   Data Reviewed: I have personally reviewed following labs and imaging studies  CBC:  Recent Labs Lab 04/28/16 0030 04/29/16 0415 04/30/16 0514 05/01/16 0426 05/02/16 0629  WBC 5.9 4.9 5.3 6.1 8.1  NEUTROABS 4.9  --  2.9 3.5 4.8  HGB 13.5 13.6 13.1 11.9* 12.4*  HCT 40.2 39.5 37.5* 35.0* 35.9*  MCV 83.8 80.3 78.8 78.7 78.7  PLT 143* 127* 144* 142* 174   Basic Metabolic Panel:  Recent Labs Lab 04/29/16 0415 04/30/16 0514 05/01/16 0426 05/01/16 2010 05/02/16 0629 05/03/16 0603 05/04/16 0749  NA 138 140 140  --  139 139 138  K 3.0* 3.5 2.7* 3.4* 3.2* 3.5 3.5  CL 104 106 107  --  104 105 104  CO2 22 23 25   --  25 27 28   GLUCOSE 250* 234* 156*  --  151* 134* 195*  BUN 34* 25* 18  --  16 16 15   CREATININE 1.45* 1.28* 1.16  --  1.04 1.09 1.15  CALCIUM 8.4* 8.4* 8.5*  --  8.4* 8.5* 8.6*  MG 1.6* 1.9  --   --  1.9  --   --    GFR: Estimated  Creatinine Clearance: 54.9 mL/min (by C-G formula based on SCr of 1.15 mg/dL). Liver Function Tests:  Recent Labs Lab 04/28/16 0030 04/29/16 0415  AST 38 42*  ALT 34 44  ALKPHOS 42 39  BILITOT 1.2 0.8  PROT 6.2* 6.0*  ALBUMIN 3.3* 2.9*   No results for input(s): LIPASE, AMYLASE in the last 168 hours. No results for input(s): AMMONIA in the last 168 hours. Coagulation Profile: No results for input(s): INR, PROTIME in the last 168 hours. Cardiac Enzymes: No results for input(s): CKTOTAL, CKMB, CKMBINDEX, TROPONINI in the last 168 hours. BNP (last 3 results) No results for input(s): PROBNP in the last 8760 hours. HbA1C: No results for input(s): HGBA1C  in the last 72 hours. CBG:  Recent Labs Lab 05/03/16 0735 05/03/16 1148 05/03/16 1647 05/03/16 2244 05/04/16 0809  GLUCAP 138* 199* 157* 77 184*   Lipid Profile: No results for input(s): CHOL, HDL, LDLCALC, TRIG, CHOLHDL, LDLDIRECT in the last 72 hours. Thyroid Function Tests: No results for input(s): TSH, T4TOTAL, FREET4, T3FREE, THYROIDAB in the last 72 hours. Anemia Panel: No results for input(s): VITAMINB12, FOLATE, FERRITIN, TIBC, IRON, RETICCTPCT in the last 72 hours. Sepsis Labs:  Recent Labs Lab 04/28/16 0031 04/28/16 0341  LATICACIDVEN 1.4 1.6    Recent Results (from the past 240 hour(s))  Culture, blood (routine x 2)     Status: None   Collection Time: 04/28/16 12:31 AM  Result Value Ref Range Status   Specimen Description BLOOD RIGHT ARM  Final   Special Requests AEROBIC BOTTLE ONLY 5ML  Final   Culture NO GROWTH 5 DAYS  Final   Report Status 05/03/2016 FINAL  Final  Culture, blood (routine x 2)     Status: None   Collection Time: 04/28/16 12:46 AM  Result Value Ref Range Status   Specimen Description BLOOD LEFT HAND  Final   Special Requests BOTTLES DRAWN AEROBIC AND ANAEROBIC 5ML  Final   Culture NO GROWTH 5 DAYS  Final   Report Status 05/03/2016 FINAL  Final  C difficile quick scan w PCR reflex      Status: None   Collection Time: 04/28/16  1:37 AM  Result Value Ref Range Status   C Diff antigen NEGATIVE NEGATIVE Final   C Diff toxin NEGATIVE NEGATIVE Final   C Diff interpretation No C. difficile detected.  Final       Radiology Studies: No results found.    Scheduled Meds: . amLODipine  10 mg Oral Daily  . aspirin EC  81 mg Oral Daily  . atorvastatin  40 mg Oral Daily  . cefTRIAXone (ROCEPHIN)  IV  2 g Intravenous Q24H  . enoxaparin (LOVENOX) injection  40 mg Subcutaneous Q24H  . insulin aspart  0-5 Units Subcutaneous QHS  . insulin aspart  0-9 Units Subcutaneous TID WC  . insulin aspart  3 Units Subcutaneous TID WC  . insulin glargine  12 Units Subcutaneous Daily  . loratadine  10 mg Oral Daily  . metoprolol succinate  100 mg Oral Daily  . vitamin B-6  250 mg Oral Daily  . vitamin C  500 mg Oral Daily   Continuous Infusions:     LOS: 7 days    Time spent: 25 minutes   Dessa Phi, DO [facility] [email or web address] Password [email or web address] 05/04/2016, 10:27 AM

## 2016-05-04 NOTE — Progress Notes (Signed)
Physical Therapy Treatment Patient Details Name: Evan Stanley MRN: [id number] DOB: 1941-03-26 Today's Date: 05/04/2016    History of Present Illness This is a 89-yo RH man with a medical history significant of HTN, HLD, DM, A. Fib, CVA, and R. CEA who was transferred from [place] for evaluation of reported aphasia and facial droop. Found to have a fever and diarrhea, likely UTI; Left hemiparesis appears to be recrudescence of prior deficits from a stroke in May 2017. This is in the setting of an acute febrile illness. MRI scan of the brain did not reveal any acute ischemic changes to suggest a new infarction.    PT Comments    Mr. Pischke was agreeable to getting up and OOB now that the flexiseal tube is out; He was a little surprised at how weak he felt getting up; Educated pt on the effects of bedrest; I'm improesed with how much better he looks, and hope for good progress with mobility  Follow Up Recommendations  Home health PT;Supervision/Assistance - 24 hour     Equipment Recommendations  3in1 (PT)    Recommendations for Other Services       Precautions / Restrictions Precautions Precautions: Fall    Mobility  Bed Mobility Overal bed mobility: Needs Assistance Bed Mobility: Rolling;Sidelying to Sit Rolling: Supervision Sidelying to sit: Min assist       General bed mobility comments: min handheld assist to pull to sit  Transfers Overall transfer level: Needs assistance Equipment used: 1 person hand held assist Transfers: Sit to/from Stand Sit to Stand: Min assist         General transfer comment: Min assist to steady with power up  Ambulation/Gait Ambulation/Gait assistance: Min assist Ambulation Distance (Feet):  (pivotal steps bed to chair) Assistive device: 1 person hand held assist Gait Pattern/deviations: Shuffle     General Gait Details: Cues to self-monitor for activity tolerance; Heavily dependent on UE support   Stairs            Wheelchair Mobility    Modified Rankin (Stroke Patients Only)       Balance                                    Cognition Arousal/Alertness: Awake/alert Behavior During Therapy: WFL for tasks assessed/performed Overall Cognitive Status: Within Functional Limits for tasks assessed                      Exercises      General Comments        Pertinent Vitals/Pain Pain Assessment: No/denies pain    Home Living                      Prior Function            PT Goals (current goals can now be found in the care plan section) Acute Rehab PT Goals Patient Stated Goal: feel better PT Goal Formulation: With patient Time For Goal Achievement: 05/12/16 Potential to Achieve Goals: Good Progress towards PT goals: Progressing toward goals    Frequency    Min 3X/week      PT Plan Current plan remains appropriate    Co-evaluation             End of Session Equipment Utilized During Treatment: Gait belt Activity Tolerance: Patient tolerated treatment well Patient left: in chair;with call bell/phone within  reach;with chair alarm set     Time: [phone] (End time is approximate) PT Time Calculation (min) (ACUTE ONLY): 23 min  Charges:  $Therapeutic Activity: 8-22 mins                    G Codes:      Quin Hoop 05/04/2016, 4:21 PM  [name], [place]  Acute Rehabilitation Services Pager [phone] Office [phone]

## 2016-05-04 NOTE — Progress Notes (Signed)
OT Cancellation Note  Patient Details Name: Evan Stanley MRN: [id number] DOB: January 17, 1941   Cancelled Treatment:    Reason Eval/Treat Not Completed: Other (comment).  Pt stated that he doesn't want to move until flexiseal is removed.  Will likely not be able to check back today.  Will check back tomorrow, if schedule permits.    Xyler Terpening 05/04/2016, 12:45 PM  Lesle Chris, OTR/L [phone] 05/04/2016

## 2016-05-04 NOTE — Progress Notes (Signed)
Rectal tube and condom catheter removed per MD order.  Patient tolerated well.  BSC at bedside and patient reminded to use call bell.

## 2016-05-04 NOTE — Progress Notes (Signed)
Subjective:  Patient no longer having diarrhea, and requiring flexisell. He has had one loose stool since removal of rectal tube.afebrile.  Antibiotics:  Anti-infectives    Start     Dose/Rate Route Frequency Ordered Stop   04/29/16 0200  cefTRIAXone (ROCEPHIN) 2 g in dextrose 5 % 50 mL IVPB     2 g 100 mL/hr over 30 Minutes Intravenous Every 24 hours 04/28/16 1152     04/29/16 0045  cefTRIAXone (ROCEPHIN) 2 g in dextrose 5 % 50 mL IVPB  Status:  Discontinued     2 g 100 mL/hr over 30 Minutes Intravenous Every 24 hours 04/28/16 1151 04/28/16 1152   04/28/16 0045  cefTRIAXone (ROCEPHIN) 1 g in dextrose 5 % 50 mL IVPB  Status:  Discontinued     1 g 100 mL/hr over 30 Minutes Intravenous Every 24 hours 04/28/16 0027 04/28/16 1151      Medications: Scheduled Meds: . amLODipine  10 mg Oral Daily  . aspirin EC  81 mg Oral Daily  . atorvastatin  40 mg Oral Daily  . cefTRIAXone (ROCEPHIN)  IV  2 g Intravenous Q24H  . enoxaparin (LOVENOX) injection  40 mg Subcutaneous Q24H  . insulin aspart  0-5 Units Subcutaneous QHS  . insulin aspart  0-9 Units Subcutaneous TID WC  . insulin aspart  3 Units Subcutaneous TID WC  . insulin glargine  12 Units Subcutaneous Daily  . loratadine  10 mg Oral Daily  . metoprolol succinate  100 mg Oral Daily  . vitamin B-6  250 mg Oral Daily  . vitamin C  500 mg Oral Daily    Objective: Weight change:   Intake/Output Summary (Last 24 hours) at 05/04/16 1744 Last data filed at 05/04/16 0930  Gross per 24 hour  Intake              512 ml  Output              425 ml  Net               87 ml   Blood pressure 132/72, pulse 67, temperature 98.3 F (36.8 C), temperature source Oral, resp. rate 16, height 5' 5"  (1.651 m), weight 170 lb 3.1 oz (77.2 kg), SpO2 99 %. Temp:  [98.1 F (36.7 C)-98.3 F (36.8 C)] 98.3 F (36.8 C) (10/16 1014) Pulse Rate:  [64-67] 67 (10/16 1014) Resp:  [16] 16 (10/16 1014) BP: (132-141)/(67-79) 132/72 (10/16  1014) SpO2:  [98 %-99 %] 99 % (10/16 1014)  Physical Exam: General: Alert and awake, oriented x3, not in any acute distress. HEENT: anicteric sclera,  EOMI, oropharynx clear and without exudate,  Cardiovascular: Irregular regular rate rhythm rate, 2/6 murmur throughout the precordium Pulmonary: clear to auscultation bilaterally, no wheezing, rales or rhonchi Gastrointestinal:  nondistended, normal bowel sounds, Musculoskeletal: no  clubbing or edema noted bilaterally Skin, soft tissue: no rashes Neuro: mild facial droop with smile  CBC:  CBC Latest Ref Rng & Units 05/02/2016 05/01/2016 04/30/2016  WBC 4.0 - 10.5 K/uL 8.1 6.1 5.3  Hemoglobin 13.0 - 17.0 g/dL 12.4(L) 11.9(L) 13.1  Hematocrit 39.0 - 52.0 % 35.9(L) 35.0(L) 37.5(L)  Platelets 150 - 400 K/uL 152 142(L) 144(L)      BMET  Recent Labs  05/03/16 0603 05/04/16 0749  NA 139 138  K 3.5 3.5  CL 105 104  CO2 27 28  GLUCOSE 134* 195*  BUN 16 15  CREATININE 1.09 1.15  CALCIUM 8.5* 8.6*    Micro Results: Recent Results (from the past 720 hour(s))  Culture, blood (routine x 2)     Status: None   Collection Time: 04/28/16 12:31 AM  Result Value Ref Range Status   Specimen Description BLOOD RIGHT ARM  Final   Special Requests AEROBIC BOTTLE ONLY 5ML  Final   Culture NO GROWTH 5 DAYS  Final   Report Status 05/03/2016 FINAL  Final  Culture, blood (routine x 2)     Status: None   Collection Time: 04/28/16 12:46 AM  Result Value Ref Range Status   Specimen Description BLOOD LEFT HAND  Final   Special Requests BOTTLES DRAWN AEROBIC AND ANAEROBIC 5ML  Final   Culture NO GROWTH 5 DAYS  Final   Report Status 05/03/2016 FINAL  Final  C difficile quick scan w PCR reflex     Status: None   Collection Time: 04/28/16  1:37 AM  Result Value Ref Range Status   C Diff antigen NEGATIVE NEGATIVE Final   C Diff toxin NEGATIVE NEGATIVE Final   C Diff interpretation No C. difficile detected.  Final    Studies/Results: No  results found.  Called [facility] lab and Salmonella species --> to [facility] for ID Sensis are :  S to ceftriaxone S ciprofloxacin S TMP/SMX S AMP   Assessment/Plan:   Principal Problem:   Salmonella bacteremia Active Problems:   DM (diabetes mellitus) ([place])   Essential hypertension   AKI (acute kidney injury) ([place])   Benign essential HTN   CVA (cerebral vascular accident) ([place])   Diarrhea   Sepsis ([place])   CKD stage 3 secondary to diabetes ([place])   Ileocolitis    Evan Stanley is a 75 y.o. male with  Prosthetic aortic valve, admitted at [facility] with a stroke that occurred in the context of diarrhea fever and found to have  Salmonella bacteremia  #1 Salmonella bacteremia:  -- continue with ceftriaxone 2gm iv daily --Cardiology to perform TEE tomorrow to determine if having bioprosthetic endocarditis  #2 Screening: HIV -, HCV and HBV negative  #3 diarrhea = continue on enteric precautions given Salmonella infection      LOS: 7 days   Evan Stanley, [place] 05/04/2016, 5:44 PM

## 2016-05-05 ENCOUNTER — Inpatient Hospital Stay (HOSPITAL_COMMUNITY): Payer: BLUE CROSS/BLUE SHIELD

## 2016-05-05 ENCOUNTER — Encounter (HOSPITAL_COMMUNITY): Payer: Self-pay | Admitting: *Deleted

## 2016-05-05 ENCOUNTER — Encounter (HOSPITAL_COMMUNITY)
Admission: AD | Disposition: A | Discharge status: Home / Self Care | Payer: Self-pay | Source: Other Acute Inpatient Hospital | Attending: Internal Medicine

## 2016-05-05 DIAGNOSIS — A029 Salmonella infection, unspecified: Secondary | ICD-10-CM

## 2016-05-05 DIAGNOSIS — I340 Nonrheumatic mitral (valve) insufficiency: Secondary | ICD-10-CM

## 2016-05-05 HISTORY — PX: TEE WITHOUT CARDIOVERSION: SHX5443

## 2016-05-05 LAB — BASIC METABOLIC PANEL
Anion gap: 6 (ref 5–15)
Anion gap: 8 (ref 5–15)
BUN: 15 mg/dL (ref 6–20)
BUN: 16 mg/dL (ref 6–20)
CHLORIDE: 106 mmol/L (ref 101–111)
CHLORIDE: 104 mmol/L (ref 101–111)
CO2: 27 mmol/L (ref 22–32)
CO2: 25 mmol/L (ref 22–32)
CREATININE: 1.19 mg/dL (ref 0.61–1.24)
CREATININE: 1.13 mg/dL (ref 0.61–1.24)
Calcium: 8.5 mg/dL — ABNORMAL LOW (ref 8.9–10.3)
Calcium: 8.2 mg/dL — ABNORMAL LOW (ref 8.9–10.3)
GFR calc Af Amer: >60 mL/min (ref >60)
GFR calc Af Amer: >60 mL/min (ref >60)
GFR calc non Af Amer: 59 mL/min — ABNORMAL LOW (ref >60)
GFR calc non Af Amer: >60 mL/min (ref >60)
Glucose, Bld: 130 mg/dL — ABNORMAL HIGH (ref 65–99)
Glucose, Bld: 213 mg/dL — ABNORMAL HIGH (ref 65–99)
Potassium: 3.2 mmol/L — ABNORMAL LOW (ref 3.5–5.1)
Potassium: 3.6 mmol/L (ref 3.5–5.1)
Sodium: 139 mmol/L (ref 135–145)
Sodium: 137 mmol/L (ref 135–145)

## 2016-05-05 LAB — PROTIME-INR
INR: 1
Prothrombin Time: 13.2 seconds (ref 11.4–15.2)

## 2016-05-05 LAB — GLUCOSE, CAPILLARY
GLUCOSE-CAPILLARY: 140 mg/dL — AB (ref 65–99)
GLUCOSE-CAPILLARY: 157 mg/dL — AB (ref 65–99)
Glucose-Capillary: 197 mg/dL — ABNORMAL HIGH (ref 65–99)

## 2016-05-05 SURGERY — ECHOCARDIOGRAM, TRANSESOPHAGEAL
Anesthesia: Moderate Sedation

## 2016-05-05 MED ORDER — POTASSIUM CHLORIDE CRYS ER 20 MEQ PO TBCR
40.0000 meq | EXTENDED_RELEASE_TABLET | Freq: Once | ORAL | Status: AC
  Administered 2016-05-05: 40 meq via ORAL
  Filled 2016-05-05: qty 2

## 2016-05-05 MED ORDER — FENTANYL CITRATE (PF) 100 MCG/2ML IJ SOLN
INTRAMUSCULAR | Status: AC
  Filled 2016-05-05: qty 2

## 2016-05-05 MED ORDER — MIDAZOLAM HCL 10 MG/2ML IJ SOLN
INTRAMUSCULAR | Status: DC | PRN
  Administered 2016-05-05 (×2): 2 mg via INTRAVENOUS

## 2016-05-05 MED ORDER — BUTAMBEN-TETRACAINE-BENZOCAINE 2-2-14 % EX AERO
INHALATION_SPRAY | CUTANEOUS | Status: DC | PRN
  Administered 2016-05-05: 2 via TOPICAL

## 2016-05-05 MED ORDER — MIDAZOLAM HCL 5 MG/ML IJ SOLN
INTRAMUSCULAR | Status: AC
  Filled 2016-05-05: qty 2

## 2016-05-05 MED ORDER — FENTANYL CITRATE (PF) 100 MCG/2ML IJ SOLN
INTRAMUSCULAR | Status: DC | PRN
  Administered 2016-05-05: 25 ug via INTRAVENOUS

## 2016-05-05 MED ORDER — SULFAMETHOXAZOLE-TRIMETHOPRIM 800-160 MG PO TABS: 1.0000 | ORAL_TABLET | Freq: Two times a day (BID) | ORAL | Status: DC

## 2016-05-05 MED ORDER — SODIUM CHLORIDE 0.9 % IV SOLN: INTRAVENOUS | Status: DC

## 2016-05-05 MED ORDER — SULFAMETHOXAZOLE-TRIMETHOPRIM 800-160 MG PO TABS: 1.0000 | ORAL_TABLET | Freq: Two times a day (BID) | ORAL | 0 refills | Status: AC

## 2016-05-05 NOTE — Progress Notes (Addendum)
[facility] for Infectious Disease    Date of Admission:  04/27/2016   Total days of antibiotics 9           ID: Evan Stanley is a 75 y.o. male with PMHx of HTN, T2DM who presented with Salmonella Bacteremia.   Principal Problem:   Salmonella bacteremia Active Problems:   DM (diabetes mellitus) ([place])   Essential hypertension   AKI (acute kidney injury) ([place])   Benign essential HTN   CVA (cerebral vascular accident) ([place])   Diarrhea   Sepsis ([place])   CKD stage 3 secondary to diabetes ([place])   Ileocolitis  Subjective: Patient was seen and examined this morning. He denies fever, chills, nausea, vomiting or diarrhea. He had a bowel movements yesterday after his flexiseal was discontinued. He denies any further bowel movements today. He is ready to go home.   Medications:  . amLODipine  10 mg Oral Daily  . aspirin EC  81 mg Oral Daily  . atorvastatin  40 mg Oral Daily  . enoxaparin (LOVENOX) injection  40 mg Subcutaneous Q24H  . insulin aspart  0-5 Units Subcutaneous QHS  . insulin aspart  0-9 Units Subcutaneous TID WC  . insulin aspart  3 Units Subcutaneous TID WC  . insulin glargine  12 Units Subcutaneous Daily  . loratadine  10 mg Oral Daily  . metoprolol succinate  100 mg Oral Daily  . vitamin B-6  250 mg Oral Daily  . [START ON 05/06/2016] sulfamethoxazole-trimethoprim  1 tablet Oral Q12H  . vitamin C  500 mg Oral Daily    Objective: Vitals:   05/05/16 0820 05/05/16 0825 05/05/16 0845 05/05/16 0948  BP: (!) 168/47 (!) 141/48 124/63 (!) 129/55  Pulse: 71 67 66 62  Resp: 19 (!) 21 19 18   Temp:    97.9 F (36.6 C)  TempSrc:    Oral  SpO2: 100% 100% 97% 99%  Weight:      Height:       General: Vital signs reviewed.  Patient is well-developed and well-nourished, in no acute distress and cooperative with exam.  Cardiovascular: RRR Pulmonary/Chest: Clear to auscultation bilaterally, no wheezes, rales, or rhonchi. Abdominal: Soft, non-tender, BS +, no guarding  present.  Skin: Warm, dry and intact. No rashes or erythema. Psychiatric: Normal mood and affect. speech and behavior is normal. Cognition and memory are normal.   Lab Results  Recent Labs  05/04/16 0749 05/05/16 0439  NA 138 139  K 3.5 3.2*  CL 104 106  CO2 28 27  BUN 15 15  CREATININE 1.15 1.19    Microbiology: BCx from [facility]: Non-Typhoidal Salmonella BCx 10/10 NGTD  Studies/Results: TEE: No evidence of vegetations.   Assessment/Plan: Mr. Emel is a 75 yo male who presented with Non-Typhoidal Salmonella Bacteremia.   Salmonella Bacteremia: Patient has improved clinically without ongoing diarrhea, fever or chills. He has been afebrile since 10/10. BCx from 10/10 are NGTD. TEE on 10/17 showed no evidence of vegetations, s/p AVR with no AI and normal LV systolic function. Patient completed a dose of Ceftriaxone this morning for a total of 9 days of antibiotics.  -Discontinue Ceftriaxone -Discharge to home on Bactrim DS BID for 5 more days of antibiotics to complete a 14 day course -Okay to discharge from ID standpoint, we will sign off -Patient to follow up with PCP  Martyn Malay, DO PGY-3 Internal Medicine Resident Pager # [phone] 05/05/2016 11:29 AM   ------------------------------------------  Date: 05/05/2016  Patient name: Evan Stanley  [facility] record number: [id number]  Date of birth: November 15, 1940   I have seen and examined the patient with dr burns. This patient's plan of care was discussed dr. Quay Burow and patient. Please see their note for complete details. I concur with their findings.   Carlyle Basques, MD 05/05/2016, 12:04 PM

## 2016-05-05 NOTE — Care Management Note (Signed)
Case Management Note  Patient Details  Name: Evan Stanley MRN: [id number] Date of Birth: 05-May-1941  Subjective/Objective:        CM following for progression and d/c planning.            Action/Plan: 05/05/2016 Met with pt re [place] needs pt is agreeable to HHPT and states that he has a walker, declined 3:1 for home use. [facility] notified of pt request and plan to d/c to home today.   Expected Discharge Date:  05/05/2016              Expected Discharge Plan:  [place]  In-House Referral:  NA  Discharge planning Services  CM Consult  Post Acute Care Choice:  Home Health Choice offered to:  Patient  DME Arranged:  N/A DME Agency:  NA  HH Arranged:  PT [place] Agency:  [facility]  Status of Service:  Completed, signed off  If discussed at [place] of Stay Meetings, dates discussed:    Additional Comments:  Adron Bene, RN 05/05/2016, 3:29 PM

## 2016-05-05 NOTE — Progress Notes (Signed)
*  PRELIMINARY RESULTS* Echocardiogram Echocardiogram Transesophageal has been performed.  Evan Stanley 05/05/2016, 12:04 PM

## 2016-05-05 NOTE — H&P (View-Only) (Signed)
Subjective:  Patient no longer having diarrhea, and requiring flexisell. He has had one loose stool since removal of rectal tube.afebrile.  Antibiotics:  Anti-infectives    Start     Dose/Rate Route Frequency Ordered Stop   04/29/16 0200  cefTRIAXone (ROCEPHIN) 2 g in dextrose 5 % 50 mL IVPB     2 g 100 mL/hr over 30 Minutes Intravenous Every 24 hours 04/28/16 1152     04/29/16 0045  cefTRIAXone (ROCEPHIN) 2 g in dextrose 5 % 50 mL IVPB  Status:  Discontinued     2 g 100 mL/hr over 30 Minutes Intravenous Every 24 hours 04/28/16 1151 04/28/16 1152   04/28/16 0045  cefTRIAXone (ROCEPHIN) 1 g in dextrose 5 % 50 mL IVPB  Status:  Discontinued     1 g 100 mL/hr over 30 Minutes Intravenous Every 24 hours 04/28/16 0027 04/28/16 1151      Medications: Scheduled Meds: . amLODipine  10 mg Oral Daily  . aspirin EC  81 mg Oral Daily  . atorvastatin  40 mg Oral Daily  . cefTRIAXone (ROCEPHIN)  IV  2 g Intravenous Q24H  . enoxaparin (LOVENOX) injection  40 mg Subcutaneous Q24H  . insulin aspart  0-5 Units Subcutaneous QHS  . insulin aspart  0-9 Units Subcutaneous TID WC  . insulin aspart  3 Units Subcutaneous TID WC  . insulin glargine  12 Units Subcutaneous Daily  . loratadine  10 mg Oral Daily  . metoprolol succinate  100 mg Oral Daily  . vitamin B-6  250 mg Oral Daily  . vitamin C  500 mg Oral Daily    Objective: Weight change:   Intake/Output Summary (Last 24 hours) at 05/04/16 1744 Last data filed at 05/04/16 0930  Gross per 24 hour  Intake              512 ml  Output              425 ml  Net               87 ml   Blood pressure 132/72, pulse 67, temperature 98.3 F (36.8 C), temperature source Oral, resp. rate 16, height 5' 5"  (1.651 m), weight 170 lb 3.1 oz (77.2 kg), SpO2 99 %. Temp:  [98.1 F (36.7 C)-98.3 F (36.8 C)] 98.3 F (36.8 C) (10/16 1014) Pulse Rate:  [64-67] 67 (10/16 1014) Resp:  [16] 16 (10/16 1014) BP: (132-141)/(67-79) 132/72 (10/16  1014) SpO2:  [98 %-99 %] 99 % (10/16 1014)  Physical Exam: General: Alert and awake, oriented x3, not in any acute distress. HEENT: anicteric sclera,  EOMI, oropharynx clear and without exudate,  Cardiovascular: Irregular regular rate rhythm rate, 2/6 murmur throughout the precordium Pulmonary: clear to auscultation bilaterally, no wheezing, rales or rhonchi Gastrointestinal:  nondistended, normal bowel sounds, Musculoskeletal: no  clubbing or edema noted bilaterally Skin, soft tissue: no rashes Neuro: mild facial droop with smile  CBC:  CBC Latest Ref Rng & Units 05/02/2016 05/01/2016 04/30/2016  WBC 4.0 - 10.5 K/uL 8.1 6.1 5.3  Hemoglobin 13.0 - 17.0 g/dL 12.4(L) 11.9(L) 13.1  Hematocrit 39.0 - 52.0 % 35.9(L) 35.0(L) 37.5(L)  Platelets 150 - 400 K/uL 152 142(L) 144(L)      BMET  Recent Labs  05/03/16 0603 05/04/16 0749  NA 139 138  K 3.5 3.5  CL 105 104  CO2 27 28  GLUCOSE 134* 195*  BUN 16 15  CREATININE 1.09 1.15  CALCIUM 8.5* 8.6*    Micro Results: Recent Results (from the past 720 hour(s))  Culture, blood (routine x 2)     Status: None   Collection Time: 04/28/16 12:31 AM  Result Value Ref Range Status   Specimen Description BLOOD RIGHT ARM  Final   Special Requests AEROBIC BOTTLE ONLY 5ML  Final   Culture NO GROWTH 5 DAYS  Final   Report Status 05/03/2016 FINAL  Final  Culture, blood (routine x 2)     Status: None   Collection Time: 04/28/16 12:46 AM  Result Value Ref Range Status   Specimen Description BLOOD LEFT HAND  Final   Special Requests BOTTLES DRAWN AEROBIC AND ANAEROBIC 5ML  Final   Culture NO GROWTH 5 DAYS  Final   Report Status 05/03/2016 FINAL  Final  C difficile quick scan w PCR reflex     Status: None   Collection Time: 04/28/16  1:37 AM  Result Value Ref Range Status   C Diff antigen NEGATIVE NEGATIVE Final   C Diff toxin NEGATIVE NEGATIVE Final   C Diff interpretation No C. difficile detected.  Final    Studies/Results: No  results found.  Called [facility] lab and Salmonella species --> to [facility] for ID Sensis are :  S to ceftriaxone S ciprofloxacin S TMP/SMX S AMP   Assessment/Plan:   Principal Problem:   Salmonella bacteremia Active Problems:   DM (diabetes mellitus) ([place])   Essential hypertension   AKI (acute kidney injury) ([place])   Benign essential HTN   CVA (cerebral vascular accident) ([place])   Diarrhea   Sepsis ([place])   CKD stage 3 secondary to diabetes ([place])   Ileocolitis    Evan Stanley is a 75 y.o. male with  Prosthetic aortic valve, admitted at [facility] with a stroke that occurred in the context of diarrhea fever and found to have  Salmonella bacteremia  #1 Salmonella bacteremia:  -- continue with ceftriaxone 2gm iv daily --Cardiology to perform TEE tomorrow to determine if having bioprosthetic endocarditis  #2 Screening: HIV -, HCV and HBV negative  #3 diarrhea = continue on enteric precautions given Salmonella infection      LOS: 7 days   Evan Stanley, [place] 05/04/2016, 5:44 PM

## 2016-05-05 NOTE — Progress Notes (Signed)
Physical Therapy Treatment Patient Details Name: Evan Stanley MRN: [id number] DOB: 1941-06-17 Today's Date: 05/05/2016    History of Present Illness This is a 58-yo RH man with a medical history significant of HTN, HLD, DM, A. Fib, CVA, and R. CEA who was transferred from [place] for evaluation of reported aphasia and facial droop. Found to have a fever and diarrhea, likely UTI; Left hemiparesis appears to be recrudescence of prior deficits from a stroke in May 2017. This is in the setting of an acute febrile illness. MRI scan of the brain did not reveal any acute ischemic changes to suggest a new infarction.    PT Comments    Continuing progress with activity tolerance;  Able to walk in hallway today, stil weak and fatigued from recent bedrest; We discussed making plans for safety in the home while he recovers, most importantly, self-monitor for activity tolerance and rest breaks  Follow Up Recommendations  Home health PT;Supervision/Assistance - 24 hour     Equipment Recommendations  3in1 (PT)    Recommendations for Other Services       Precautions / Restrictions Precautions Precautions: Fall Restrictions Weight Bearing Restrictions: No    Mobility  Bed Mobility               General bed mobility comments: Inchair upon arrival  Transfers Overall transfer level: Needs assistance Equipment used: Rolling walker (2 wheeled) Transfers: Sit to/from Stand Sit to Stand: Min guard         General transfer comment: Cues for hand placement and safety; no need for physical assist  Ambulation/Gait Ambulation/Gait assistance: Min guard Ambulation Distance (Feet): 80 Feet Assistive device: Rolling walker (2 wheeled) Gait Pattern/deviations: Step-through pattern;Decreased step length - right;Decreased step length - left     General Gait Details: Cues to self-monitor for activity tolerance; Heavily dependent on UE support; very weak and close guard  for safety   Stairs            Wheelchair Mobility    Modified Rankin (Stroke Patients Only)       Balance                                    Cognition Arousal/Alertness: Awake/alert Behavior During Therapy: WFL for tasks assessed/performed Overall Cognitive Status: Within Functional Limits for tasks assessed                      Exercises      General Comments        Pertinent Vitals/Pain Pain Assessment: No/denies pain    Home Living                      Prior Function            PT Goals (current goals can now be found in the care plan section) Acute Rehab PT Goals Patient Stated Goal: feel better PT Goal Formulation: With patient Time For Goal Achievement: 05/12/16 Potential to Achieve Goals: Good Progress towards PT goals: Progressing toward goals    Frequency    Min 3X/week      PT Plan Current plan remains appropriate    Co-evaluation             End of Session Equipment Utilized During Treatment: Gait belt Activity Tolerance: Patient tolerated treatment well Patient left: in chair;with call bell/phone within reach;with chair alarm  set     Time: 3888-7579 PT Time Calculation (min) (ACUTE ONLY): 21 min  Charges:  $Gait Training: 8-22 mins                    G Codes:      Quin Hoop 05/05/2016, 4:39 PM  [name], [place] Pager [phone] Office [phone]

## 2016-05-05 NOTE — CV Procedure (Signed)
See full TEE report in [facility] Pt sedated with versed 4 mg and fentanyl 25 micrograms IV. Normal LV systolic function S/p AVR with no AI No vegetations [place]

## 2016-05-05 NOTE — Discharge Instructions (Signed)
Recommendations for Outpatient Follow-up:  1. Follow up with PCP in 1-2 weeks 2. Please obtain BMP in one week  3. We highly recommend a GI referral for colonoscopy once acute illness resolves.     Salmonella Gastroenteritis, Adult Salmonella gastroenteritis occurs when certain bacteria infect the intestines. People usually begin to feel ill within 72 hours after the infection occurs. The illness can last from 2 days to 2 weeks. Elderly and immunocompromised people are at the greatest risk of this infection. Most people recover completely. However, salmonella bacteria can spread from the intestines to the blood and other parts of the body. In rare cases, a person may develop reactive arthritis with pain in the joints, irritation of the eyes, and painful urination. CAUSES  Salmonella gastroenteritis usually occurs after eating food or drinking liquids that are contaminated with salmonella bacteria. Common causes of this contamination include:  Poor personal hygiene.  Poor kitchen hygiene.  Drinking polluted, standing water.  Contact with carriers of the bacteria. Reptiles are strongly associated with the bacteria, but other animals may carry the bacteria as well. SIGNS AND SYMPTOMS   Nausea.  Vomiting.  Abdominal pain or cramps.  Diarrhea, which may be bloody.  Fever.  Headache. DIAGNOSIS  Your health care provider will take your medical history and perform a physical exam. A blood or stool sample may also be taken and tested for the presence of salmonella bacteria. TREATMENT  Often, no treatment is needed. However, you will need to drink plenty of fluids to prevent dehydration. In severe cases, antibiotic medicines may be given to help shorten the illness. HOME CARE INSTRUCTIONS  Drink enough fluids to keep your urine clear or pale yellow. Until your diarrhea, nausea, or vomiting is under control, you should only drink clear liquids. Clear liquids are anything you can see  through, such as water, broth, or non-caffeinated tea. Avoid:  Milk.  Fruit juice.  Alcohol.  Extremely hot or cold fluids.  If you do not have an appetite, do not force yourself to eat. However, you must continue to drink fluids.  If you have an appetite, eat a normal diet unless your health care provider tells you differently.  Eat a variety of complex carbohydrates (rice, wheat, potatoes, bread), lean meats, yogurt, fruits, and vegetables.  Avoid high-fat foods because they are more difficult to digest.  If you are dehydrated, ask your health care provider for specific rehydration instructions. Signs of dehydration may include:  Severe thirst.  Dry lips and mouth.  Dizziness.  Dark urine.  Decreasing urine frequency and amount.  Confusion.  Rapid breathing or pulse.  If you were prescribed an antibiotic medicine, finish it all even if you start to feel better.  Take medicines only as directed by your health care provider. Antidiarrheal medicines are not recommended.  Keep all follow-up visits as directed by your health care provider. PREVENTION  To prevent future salmonella infections:  Handle meat, eggs, seafood, and poultry properly.  Wash your hands and counters thoroughly after handling or preparing meat, eggs, seafood, and poultry.  Always cook meat, eggs, seafood, and poultry thoroughly.  Wash your hands thoroughly after handling animals. SEEK IMMEDIATE MEDICAL CARE IF:   You are unable to keep fluids down.  You have persistent vomiting or diarrhea.  You have abdominal pain that increases or is concentrated in one small area (localized).  Your diarrhea contains increased blood or mucus.  You feel very weak, dizzy, thirsty, or you faint.  You lose a significant  amount of weight. Your health care provider can tell you how much weight loss should concern you.  You have a fever. MAKE SURE YOU:   Understand these instructions.  Will watch your  condition.  Will get help right away if you are not doing well or get worse.   This information is not intended to replace advice given to you by your health care provider. Make sure you discuss any questions you have with your health care provider.   Document Released: 07/03/2000 Document Revised: 11/20/2014 Document Reviewed: 09/03/2011 Elsevier Interactive Patient Education 2016 Elsevier Inc.      Colitis Colitis is inflammation of the colon. Colitis may last a short time (acute) or it may last a long time (chronic). CAUSES This condition may be caused by:  Viruses.  Bacteria.  Reactions to medicine.  Certain autoimmune diseases, such as Crohn disease or ulcerative colitis. SYMPTOMS Symptoms of this condition include:  Diarrhea.  Passing bloody or tarry stool.  Pain.  Fever.  Vomiting.  Tiredness (fatigue).  Weight loss.  Bloating.  Sudden increase in abdominal pain.  Having fewer bowel movements than usual. DIAGNOSIS This condition is diagnosed with a stool test or a blood test. You may also have other tests, including X-rays, a CT scan, or a colonoscopy. TREATMENT Treatment may include:  Resting the bowel. This involves not eating or drinking for a period of time.  Fluids that are given through an IV tube.  Medicine for pain and diarrhea.  Antibiotic medicines.  Cortisone medicines.  Surgery. HOME CARE INSTRUCTIONS Eating and Drinking  Follow instructions from your health care provider about eating or drinking restrictions.  Drink enough fluid to keep your urine clear or pale yellow.  Work with a dietitian to determine which foods cause your condition to flare up.  Avoid foods that cause flare-ups.  Eat a well-balanced diet. Medicines  Take over-the-counter and prescription medicines only as told by your health care provider.  If you were prescribed an antibiotic medicine, take it as told by your health care provider. Do not stop  taking the antibiotic even if you start to feel better. General Instructions  Keep all follow-up visits as told by your health care provider. This is important. SEEK MEDICAL CARE IF:  Your symptoms do not go away.  You develop new symptoms. SEEK IMMEDIATE MEDICAL CARE IF:  You have a fever that does not go away with treatment.  You develop chills.  You have extreme weakness, fainting, or dehydration.  You have repeated vomiting.  You develop severe pain in your abdomen.  You pass bloody or tarry stool.   This information is not intended to replace advice given to you by your health care provider. Make sure you discuss any questions you have with your health care provider.   Document Released: 08/13/2004 Document Revised: 03/27/2015 Document Reviewed: 10/29/2014 Elsevier Interactive Patient Education [employer].

## 2016-05-05 NOTE — Interval H&P Note (Signed)
History and Physical Interval Note:  05/05/2016 7:55 AM  Evan Stanley  has presented today for surgery, with the diagnosis of BACTEREMIA  The various methods of treatment have been discussed with the patient and family. After consideration of risks, benefits and other options for treatment, the patient has consented to  Procedure(s): TRANSESOPHAGEAL ECHOCARDIOGRAM (TEE) (N/A) as a surgical intervention .  The patient's history has been reviewed, patient examined, no change in status, stable for surgery.  I have reviewed the patient's chart and labs.  Questions were answered to the patient's satisfaction.     Kirk Ruths

## 2016-05-05 NOTE — Progress Notes (Signed)
PT Cancellation Note  Patient Details Name: Evan Stanley MRN: [id number] DOB: [birth year]   Cancelled Treatment:    Reason Eval/Treat Not Completed: Patient at procedure or test/unavailable   Currently off the floor at TEE;   Will follow up later today as time allows;  Otherwise, will follow up for PT tomorrow;   Thank you,  [name], [place] Pager [phone] Office [phone]     [name] [facility] 05/05/2016, 8:51 AM

## 2016-05-05 NOTE — Discharge Summary (Signed)
Physician Discharge Summary  Evan Stanley ZJQ:[id number] DOB: May 07, 1941 DOA: 04/27/2016  PCP: Wardell Honour, MD  Admit date: 04/27/2016 Discharge date: 05/05/2016  Admitted From: Home Disposition:  Home  Recommendations for Outpatient Follow-up:  1. Follow up with PCP in 1-2 weeks 2. Please obtain BMP in one week  3. We highly recommend a GI referral for colonoscopy once acute illness resolves.   Home Health: Home health PT  Equipment/Devices: None   Discharge Condition: Stable CODE STATUS: Full  Diet recommendation: General  Brief/Interim Summary: Evan Smithis a 75 y.o.malewith medical history significant of HTN, HLD, DM, A. Fib,CVA, and R. CEA on 12/2015; who presents with complaints of left-sided facial droop and weakness. History is obtained mostly from the patient's wife. Patient had previously had a stroke after a CT angiogram to evaluate his carotids in March of 2017 with left-sided weakness. Patient subsequently underwent a right carotid artery endarterectomy in June. He underwent physical therapy at that time with almost complete resolution of symptoms. However, patient was noted to have acute loss of ability to walk and left-sided facial droop yesterday (10/8) evening.His wife called 75 and he was brought to [facility]. Initially admitted overnight at [facility] with complaints of left facial droop and generalized weakness of the lower extremities. Patient was determined to be outside of TPA window with initial CT scan of the head negative for any acute process. Subsequent, MRI result received in evolution of old stroke and new occlusive vascular disease. Patient subsequently developed fever and diarrhea. Patient was then transferred to [facility] for further workup and treatment.   Results of blood cultures drawn at [facility] positive for salmonella bacteremia. Patient was treated with Rocephin 2 g daily. Urine culture at [facility] was negative. C.  difficile was negative. CT abdomen pelvis was obtained on 10/11 which was consistent with ileocolitis. Case was discussed with GI over the phone. No further inpatient workup was needed and patient should follow-up as outpatient GI referral for future colonoscopy once acute episode has resolved. Patient has never had a colonoscopy in the past. This was extensively discussed with patient as well as wife. TEE was also obtained as patient has previous history of prosthetic heart valve. TEE is negative for vegetation and AI. Infectious disease did follow patient during his hospitalization. They recommend oral Bactrim for a total 14 day course (last day should be 10/22). He should follow-up with his PCP.  Discharge Diagnoses:  Principal Problem:   Salmonella bacteremia Active Problems:   DM (diabetes mellitus) ([place])   Essential hypertension   AKI (acute kidney injury) ([place])   Benign essential HTN   CVA (cerebral vascular accident) ([place])   Diarrhea   Sepsis ([place])   CKD stage 3 secondary to diabetes ([place])   Ileocolitis  Ileocolitis and salmonella bacteremia - Sepsis resolved - Blood cultures drawn at [facility] on 10/9: positive for gram neg rods, Salmonella  - Urine culture at [facility] negative (confirmed with microlab)  - Blood cultures 10/9: negative  - C Diff negative   - CT abd/pelvis 10/11: Abnormal wall thickening in the distal ileum, cecum, and ascending colon, suspicious for ileocolitis  - Spoke with Azucena Freed, GI PA. No further inpatient work up needed. Treat bacteremia and patient needs outpatient GI referral for future colonoscopy once acute episode resolves. Patient has never had a colonoscopy before.  - HIV, hepatitis - Negative  - Appreciate ID. Bactrim DS BID for total 14 day course  Left-sided hemiparesis/ history of CVA: Patient  with recurrence of left-sided weakness and symptoms of previous stroke. S/p right carotid CEA. Evaluated by neurology who suspected recrudescence of  prior deficits. Recommended rehabilitation services as needed.  - AppreciateNeurology consultation  - Continue aspirin, lipitor  - PT/OT  AKI on CKD stage III - Secondary to diarrhea - Resolved   Hypokalemia - Replace  - Needs to be rechecked outpatient   Diabetes mellitus type 2, uncontrolled, with complication including CKD stage III: Hemoglobin A1c at outside facility noted to be 10.3 - Hold metformin - Lantus and SSI, mealtime coverage   Essential Hypertension - Continue amlodipine, metoprolol  - May restart meds as AKI resolved   Discharge Instructions  Discharge Instructions    Call MD for:  persistant nausea and vomiting    Complete by:  As directed    Call MD for:  severe uncontrolled pain    Complete by:  As directed    Call MD for:  temperature >100.4    Complete by:  As directed    Diet Carb Modified    Complete by:  As directed    Discharge instructions    Complete by:  As directed    Recommendations for Outpatient Follow-up:  Follow up with PCP in 1-2 weeks Please obtain BMP in one week  We highly recommend a GI referral for colonoscopy once acute illness resolves.   Increase activity slowly    Complete by:  As directed        Medication List    TAKE these medications   amLODipine 10 MG tablet Commonly known as:  NORVASC Take 1 tablet (10 mg total) by mouth daily.   aspirin EC 81 MG tablet Take 1 tablet (81 mg total) by mouth daily.   atorvastatin 40 MG tablet Commonly known as:  LIPITOR Take 1 tablet (40 mg total) by mouth daily.   cetirizine 10 MG tablet Commonly known as:  ZYRTEC Take 10 mg by mouth daily.   hydrochlorothiazide 25 MG tablet Commonly known as:  HYDRODIURIL Take 1 tablet (25 mg total) by mouth daily.   latanoprost 0.005 % ophthalmic solution Commonly known as:  XALATAN Place 1 drop into the left eye at bedtime.   lisinopril 40 MG tablet Commonly known as:  PRINIVIL,ZESTRIL Take 1 tablet (40 mg total) by mouth  daily.   metFORMIN 1000 MG tablet Commonly known as:  GLUCOPHAGE Take 1 tablet (1,000 mg total) by mouth 2 (two) times daily with a meal.   metoprolol succinate 100 MG 24 hr tablet Commonly known as:  TOPROL-XL Take 1 tablet (100 mg total) by mouth daily. Take with or immediately following a meal.   oxyCODONE-acetaminophen 5-325 MG tablet Commonly known as:  PERCOCET/ROXICET Take 1-2 tablets by mouth every 4 (four) hours as needed for moderate pain.   sulfamethoxazole-trimethoprim 800-160 MG tablet Commonly known as:  BACTRIM DS,SEPTRA DS Take 1 tablet by mouth every 12 (twelve) hours. Start taking on:  05/06/2016   VITAMIN B6 PO Take by mouth daily.   vitamin C 500 MG tablet Commonly known as:  ASCORBIC ACID Take 500 mg by mouth daily.   vitamin E 1000 UNIT capsule Generic drug:  vitamin E Take 1,000 Units by mouth daily.      Follow-up Information    MILLER, Lillette Boxer, MD. Schedule an appointment as soon as possible for a visit in 1 week(s).   Specialty:  Family Medicine Contact information: [address] [place] [address] [phone]  No Known Allergies  Consultations:  Infectious disease   Procedures/Studies: Ct Abdomen Pelvis W Contrast  Result Date: 04/29/2016 CLINICAL DATA:  Diarrhea for 1 month. G negative bacteremia. Nausea and vomiting. EXAM: CT ABDOMEN AND PELVIS WITH CONTRAST TECHNIQUE: Multidetector CT imaging of the abdomen and pelvis was performed using the standard protocol following bolus administration of intravenous contrast. CONTRAST:  174m ISOVUE-300 IOPAMIDOL (ISOVUE-300) INJECTION 61% COMPARISON:  11/28/2015 FINDINGS: Lower chest: Aortic valve prosthesis.  Coronary atherosclerosis. Hepatobiliary: Mildly contracted gallbladder. Pancreas: Unremarkable Spleen: Unremarkable Adrenals/Urinary Tract: Hypodense 6 mm lesion of the right kidney lower pole, nonspecific due to small size, image 27/301. Stomach/Bowel: Abnormal wall  thickening in the distal ileum extending to the ileocecal valve with potential wall thickening and smoothing of the wall contour in the cecum and ascending colon. Air-fluid levels in the distal colon favoring diarrheal process. Flexi-seal fecal collector device in the rectum. Vascular/Lymphatic: Aortoiliac atherosclerotic vascular disease. There is notable atheromatous narrowing in the left renal artery. Small gastrohepatic lymph nodes are not pathologically enlarged by size criteria. Reproductive: Calcifications along the posterior portion of the central zone of the prostate gland. Other: No supplemental non-categorized findings. Musculoskeletal: Mild degenerative arthropathy of both hips. Lumbar spondylosis and degenerative disc disease causing suspected impingement at the L3-4 level. Small umbilical hernia contains adipose tissue. IMPRESSION: 1. Abnormal wall thickening in the distal ileum, cecum, and ascending colon, suspicious for ileocolitis. This could be infectious or due to Crohn's disease. 2. Extensive atherosclerosis, including the coronary arteries. Suspected high-grade stenosis in the left renal artery. 3. Nonspecific 6 mm hypodense lesion in the right kidney lower pole, likely a cyst but technically nonspecific due to small size. 4. Scattered air-fluid levels in the distal colon favoring diarrheal process. 5. Probable impingement at the L3-4 level due to spondylosis and degenerative disc disease. Electronically Signed   By: WVan ClinesM.D.   On: 04/29/2016 15:00    TEE 134/28Normal LV systolic function S/p AVR with no AI No vegetations  Subjective: Patient doing well today. Tolerating diet. He denies any fevers, abdominal pain, nausea, vomiting. He has only had 2 episodes of diarrhea since flexiseal was removed yesterday morning.  Discharge Exam: Vitals:   05/05/16 0845 05/05/16 0948  BP: 124/63 (!) 129/55  Pulse: 66 62  Resp: 19 18  Temp:  97.9 F (36.6 C)   Vitals:    05/05/16 0820 05/05/16 0825 05/05/16 0845 05/05/16 0948  BP: (!) 168/47 (!) 141/48 124/63 (!) 129/55  Pulse: 71 67 66 62  Resp: 19 (!) 21 19 18   Temp:    97.9 F (36.6 C)  TempSrc:    Oral  SpO2: 100% 100% 97% 99%  Weight:      Height:        General: Pt is alert, awake, not in acute distress Cardiovascular: RRR, S1/S2 +, no rubs, no gallops Respiratory: CTA bilaterally, no wheezing, no rhonchi Abdominal: Soft, NT, ND, bowel sounds + Extremities: no edema, no cyanosis    The results of significant diagnostics from this hospitalization (including imaging, microbiology, ancillary and laboratory) are listed below for reference.     Microbiology: Recent Results (from the past 240 hour(s))  Culture, blood (routine x 2)     Status: None   Collection Time: 04/28/16 12:31 AM  Result Value Ref Range Status   Specimen Description BLOOD RIGHT ARM  Final   Special Requests AEROBIC BOTTLE ONLY 5ML  Final   Culture NO GROWTH 5 DAYS  Final   Report  Status 05/03/2016 FINAL  Final  Culture, blood (routine x 2)     Status: None   Collection Time: 04/28/16 12:46 AM  Result Value Ref Range Status   Specimen Description BLOOD LEFT HAND  Final   Special Requests BOTTLES DRAWN AEROBIC AND ANAEROBIC 5ML  Final   Culture NO GROWTH 5 DAYS  Final   Report Status 05/03/2016 FINAL  Final  C difficile quick scan w PCR reflex     Status: None   Collection Time: 04/28/16  1:37 AM  Result Value Ref Range Status   C Diff antigen NEGATIVE NEGATIVE Final   C Diff toxin NEGATIVE NEGATIVE Final   C Diff interpretation No C. difficile detected.  Final     Labs: BNP (last 3 results) No results for input(s): BNP in the last 8760 hours. Basic Metabolic Panel:  Recent Labs Lab 04/29/16 0415 04/30/16 0514 05/01/16 0426 05/01/16 2010 05/02/16 0629 05/03/16 0603 05/04/16 0749 05/05/16 0439  NA 138 140 140  --  139 139 138 139  K 3.0* 3.5 2.7* 3.4* 3.2* 3.5 3.5 3.2*  CL 104 106 107  --  104 105 104  106  CO2 22 23 25   --  25 27 28 27   GLUCOSE 250* 234* 156*  --  151* 134* 195* 130*  BUN 34* 25* 18  --  16 16 15 15   CREATININE 1.45* 1.28* 1.16  --  1.04 1.09 1.15 1.19  CALCIUM 8.4* 8.4* 8.5*  --  8.4* 8.5* 8.6* 8.5*  MG 1.6* 1.9  --   --  1.9  --   --   --    Liver Function Tests:  Recent Labs Lab 04/29/16 0415  AST 42*  ALT 44  ALKPHOS 39  BILITOT 0.8  PROT 6.0*  ALBUMIN 2.9*   No results for input(s): LIPASE, AMYLASE in the last 168 hours. No results for input(s): AMMONIA in the last 168 hours. CBC:  Recent Labs Lab 04/29/16 0415 04/30/16 0514 05/01/16 0426 05/02/16 0629  WBC 4.9 5.3 6.1 8.1  NEUTROABS  --  2.9 3.5 4.8  HGB 13.6 13.1 11.9* 12.4*  HCT 39.5 37.5* 35.0* 35.9*  MCV 80.3 78.8 78.7 78.7  PLT 127* 144* 142* 152   Cardiac Enzymes: No results for input(s): CKTOTAL, CKMB, CKMBINDEX, TROPONINI in the last 168 hours. BNP: Invalid input(s): POCBNP CBG:  Recent Labs Lab 05/04/16 1150 05/04/16 1701 05/04/16 1952 05/05/16 0922 05/05/16 1216  GLUCAP 213* 233* 267* 140* 157*   D-Dimer No results for input(s): DDIMER in the last 72 hours. Hgb A1c No results for input(s): HGBA1C in the last 72 hours. Lipid Profile No results for input(s): CHOL, HDL, LDLCALC, TRIG, CHOLHDL, LDLDIRECT in the last 72 hours. Thyroid function studies No results for input(s): TSH, T4TOTAL, T3FREE, THYROIDAB in the last 72 hours.  Invalid input(s): FREET3 Anemia work up No results for input(s): VITAMINB12, FOLATE, FERRITIN, TIBC, IRON, RETICCTPCT in the last 72 hours. Urinalysis    Component Value Date/Time   COLORURINE YELLOW 01/10/2016 [place] 01/10/2016 1035   LABSPEC 1.018 01/10/2016 1035   PHURINE 6.5 01/10/2016 1035   GLUCOSEU 100 (A) 01/10/2016 1035   HGBUR NEGATIVE 01/10/2016 1035   BILIRUBINUR NEGATIVE 01/10/2016 1035   KETONESUR NEGATIVE 01/10/2016 1035   PROTEINUR NEGATIVE 01/10/2016 1035   UROBILINOGEN >8.0 (H) 10/30/2008 2328    NITRITE NEGATIVE 01/10/2016 1035   LEUKOCYTESUR NEGATIVE 01/10/2016 1035   Sepsis Labs Invalid input(s): PROCALCITONIN,  WBC,  LACTICIDVEN Microbiology  Recent Results (from the past 240 hour(s))  Culture, blood (routine x 2)     Status: None   Collection Time: 04/28/16 12:31 AM  Result Value Ref Range Status   Specimen Description BLOOD RIGHT ARM  Final   Special Requests AEROBIC BOTTLE ONLY 5ML  Final   Culture NO GROWTH 5 DAYS  Final   Report Status 05/03/2016 FINAL  Final  Culture, blood (routine x 2)     Status: None   Collection Time: 04/28/16 12:46 AM  Result Value Ref Range Status   Specimen Description BLOOD LEFT HAND  Final   Special Requests BOTTLES DRAWN AEROBIC AND ANAEROBIC 5ML  Final   Culture NO GROWTH 5 DAYS  Final   Report Status 05/03/2016 FINAL  Final  C difficile quick scan w PCR reflex     Status: None   Collection Time: 04/28/16  1:37 AM  Result Value Ref Range Status   C Diff antigen NEGATIVE NEGATIVE Final   C Diff toxin NEGATIVE NEGATIVE Final   C Diff interpretation No C. difficile detected.  Final     Time coordinating discharge: Over 30 minutes  SIGNED:  Dessa Phi, DO [facility] Pager [phone]  If 7PM-7AM, please contact night-coverage [email or web address] Password [email or web address] 05/05/2016, 12:57 PM

## 2016-05-06 ENCOUNTER — Telehealth: Payer: Self-pay | Admitting: Family Medicine

## 2016-05-06 ENCOUNTER — Encounter (HOSPITAL_COMMUNITY): Payer: Self-pay | Admitting: Cardiology

## 2016-05-06 NOTE — Telephone Encounter (Signed)
Yes I think he can take probiotic of choice without any problem

## 2016-05-08 ENCOUNTER — Telehealth: Payer: Self-pay | Admitting: Family Medicine

## 2016-05-14 ENCOUNTER — Ambulatory Visit (INDEPENDENT_AMBULATORY_CARE_PROVIDER_SITE_OTHER): Payer: BLUE CROSS/BLUE SHIELD | Admitting: Family Medicine

## 2016-05-14 ENCOUNTER — Encounter: Payer: Self-pay | Admitting: Family Medicine

## 2016-05-14 VITALS — BP 131/73 | HR 65 | Temp 96.8°F | Ht 65.0 in | Wt 172.0 lb

## 2016-05-14 DIAGNOSIS — A020 Salmonella enteritis: Secondary | ICD-10-CM | POA: Diagnosis not present

## 2016-05-14 DIAGNOSIS — Z09 Encounter for follow-up examination after completed treatment for conditions other than malignant neoplasm: Secondary | ICD-10-CM

## 2016-05-14 DIAGNOSIS — A029 Salmonella infection, unspecified: Secondary | ICD-10-CM

## 2016-05-14 NOTE — Progress Notes (Signed)
Subjective:    Patient ID: Evan Stanley, male    DOB: 09/07/1940, 75 y.o.   MRN: [id number]  HPI Patient here today for hospital follow up from [place] on 04/27/16. He was treated there for Salmonella poison.  Patient was hospitalized with weakness and initially treated for a urinary tract infection but later found to have Salmonella on blood culture. This is very unusual. In the hospital patient had vomiting and diarrhea.  Evan Stanley MAU:[id number] DOB: 01-Nov-1940 DOA: 04/27/2016  PCP: Wardell Honour, MD  Admit date: 04/27/2016 Discharge date: 05/05/2016  Admitted From: Home Disposition:  Home  Recommendations for Outpatient Follow-up:  1. Follow up with PCP in 1-2 weeks 2. Please obtain BMP in one week  3. We highly recommend a GI referral for colonoscopy once acute illness resolves.   Home Health: Home health PT  Equipment/Devices: None   Discharge Condition: Stable CODE STATUS: Full  Diet recommendation: General  Brief/Interim Summary: Evan Smithis a 75 y.o.malewith medical history significant of HTN, HLD, DM, A. Fib,CVA, and R. CEA on 12/2015; who presents with complaints of left-sided facial droop and weakness. History is obtained mostly from the patient's wife. Patient had previously had a stroke after a CT angiogram to evaluate his carotids in March of 2017 with left-sided weakness. Patient subsequently underwent a right carotid artery endarterectomy in June. He underwent physical therapy at that time with almost complete resolution of symptoms. However, patient was noted to have acute loss of ability to walk and left-sided facial droop yesterday (10/8) evening.His wife called 45 and he was brought to [facility]. Initially admitted overnight at [facility] with complaints of left facial droop and generalized weakness of the lower extremities. Patient was determined to be outside of TPA window with initial CT scan of the head negative for any acute  process. Subsequent, MRI result received in evolution of old stroke and new occlusive vascular disease. Patient subsequently developed fever and diarrhea. Patient was then transferred to [facility] for further workup and treatment.   Patient Active Problem List   Diagnosis Date Noted  . Ileocolitis 04/30/2016  . Salmonella bacteremia 04/29/2016  . Sepsis ([place]) 04/29/2016  . CKD stage 3 secondary to diabetes ([place]) 04/29/2016  . CVA (cerebral vascular accident) ([place]) 04/28/2016  . Diarrhea 04/28/2016  . Late effects of CVA (cerebrovascular accident) 04/03/2016  . Symptomatic stenosis of right carotid artery 01/13/2016  . S/P aortic valve replacement with tissue 01/05/2016  . Benign essential HTN   . AKI (acute kidney injury) ([place])   . Type 2 diabetes mellitus with peripheral neuropathy (HCC)   . Carotid artery occlusion without infarction 10/26/2014  . Hyperlipidemia 07/05/2013  . Essential hypertension 07/05/2013  . CAD (coronary artery disease)   . Type 2 diabetes mellitus with stage 3 chronic kidney disease, without long-term current use of insulin [facility])    Outpatient Encounter Prescriptions as of 05/14/2016  Medication Sig  . amLODipine (NORVASC) 10 MG tablet Take 1 tablet (10 mg total) by mouth daily.  Marland Kitchen aspirin EC 81 MG tablet Take 1 tablet (81 mg total) by mouth daily.  Marland Kitchen atorvastatin (LIPITOR) 40 MG tablet Take 1 tablet (40 mg total) by mouth daily.  . cetirizine (ZYRTEC) 10 MG tablet Take 10 mg by mouth daily.  . hydrochlorothiazide (HYDRODIURIL) 25 MG tablet Take 1 tablet (25 mg total) by mouth daily.  Marland Kitchen latanoprost (XALATAN) 0.005 % ophthalmic solution Place 1 drop into the left eye at bedtime.   Marland Kitchen lisinopril (  PRINIVIL,ZESTRIL) 40 MG tablet Take 1 tablet (40 mg total) by mouth daily.  . metFORMIN (GLUCOPHAGE) 1000 MG tablet Take 1 tablet (1,000 mg total) by mouth 2 (two) times daily with a meal.  . metoprolol succinate (TOPROL-XL) 100 MG 24 hr tablet Take 1 tablet  (100 mg total) by mouth daily. Take with or immediately following a meal.  . oxyCODONE-acetaminophen (PERCOCET/ROXICET) 5-325 MG tablet Take 1-2 tablets by mouth every 4 (four) hours as needed for moderate pain.  Marland Kitchen Pyridoxine HCl (VITAMIN B6 PO) Take by mouth daily.  . vitamin C (ASCORBIC ACID) 500 MG tablet Take 500 mg by mouth daily.  . vitamin E (VITAMIN E) 1000 UNIT capsule Take 1,000 Units by mouth daily.   No facility-administered encounter medications on file as of 05/14/2016.      Review of Systems  HENT: Negative.   Eyes: Negative.   Respiratory: Negative.   Cardiovascular: Negative.   Gastrointestinal: Negative.   Endocrine: Negative.   Genitourinary: Negative.   Musculoskeletal: Negative.   Skin: Negative.   Allergic/Immunologic: Negative.   Neurological: Positive for weakness (getting better. ).  Hematological: Negative.   Psychiatric/Behavioral: Negative.        Objective:   Physical Exam  Constitutional: He is oriented to person, place, and time. He appears well-developed and well-nourished.  Cardiovascular: Normal rate, regular rhythm and normal heart sounds.   Pulmonary/Chest: Effort normal and breath sounds normal.  Abdominal: Soft. Bowel sounds are normal. There is no tenderness. There is no rebound.  Neurological: He is alert and oriented to person, place, and time.  Psychiatric: He has a normal mood and affect. His behavior is normal.   BP 131/73 (BP Location: Right Arm)   Pulse 65   Temp (!) 96.8 F (36 C) (Oral)   Ht 5' 5"  (1.651 m)   Wt 172 lb (78 kg)   BMI 28.62 kg/m         Assessment & Plan:  1. Hospital discharge follow-up Patient is asymptomatic. Per discharge instructions, will check labs and make referral - BMP8+EGFR - Ambulatory referral to Gastroenterology  2. Salmonella poisoning We do not understand why he had Salmonella. There were no risk factors that are typically associated. - Ambulatory referral to  Gastroenterology  Wardell Honour MD

## 2016-05-15 LAB — BMP8+EGFR
BUN/Creatinine Ratio: 10 (ref 10–24)
BUN: 11 mg/dL (ref 8–27)
CHLORIDE: 98 mmol/L (ref 96–106)
CO2: 30 mmol/L — AB (ref 18–29)
CREATININE: 1.12 mg/dL (ref 0.76–1.27)
Calcium: 9.9 mg/dL (ref 8.6–10.2)
GFR calc Af Amer: 75 mL/min/{1.73_m2} (ref >59)
GFR calc non Af Amer: 65 mL/min/{1.73_m2} (ref >59)
GLUCOSE: 153 mg/dL — AB (ref 65–99)
Potassium: 5.3 mmol/L — ABNORMAL HIGH (ref 3.5–5.2)
SODIUM: 143 mmol/L (ref 134–144)

## 2016-05-15 NOTE — Telephone Encounter (Signed)
Aware. 

## 2016-06-18 ENCOUNTER — Ambulatory Visit (INDEPENDENT_AMBULATORY_CARE_PROVIDER_SITE_OTHER): Payer: BLUE CROSS/BLUE SHIELD | Admitting: Family Medicine

## 2016-06-18 DIAGNOSIS — I69354 Hemiplegia and hemiparesis following cerebral infarction affecting left non-dominant side: Secondary | ICD-10-CM | POA: Diagnosis not present

## 2016-06-18 DIAGNOSIS — A021 Salmonella sepsis: Secondary | ICD-10-CM | POA: Diagnosis not present

## 2016-06-18 DIAGNOSIS — E1122 Type 2 diabetes mellitus with diabetic chronic kidney disease: Secondary | ICD-10-CM | POA: Diagnosis not present

## 2016-06-18 DIAGNOSIS — I129 Hypertensive chronic kidney disease with stage 1 through stage 4 chronic kidney disease, or unspecified chronic kidney disease: Secondary | ICD-10-CM | POA: Diagnosis not present

## 2016-07-09 ENCOUNTER — Ambulatory Visit (INDEPENDENT_AMBULATORY_CARE_PROVIDER_SITE_OTHER): Payer: BLUE CROSS/BLUE SHIELD | Admitting: Family Medicine

## 2016-07-09 ENCOUNTER — Encounter: Payer: Self-pay | Admitting: Family Medicine

## 2016-07-09 VITALS — BP 117/60 | HR 74 | Temp 96.7°F | Ht 65.0 in | Wt 176.0 lb

## 2016-07-09 DIAGNOSIS — E109 Type 1 diabetes mellitus without complications: Secondary | ICD-10-CM

## 2016-07-09 DIAGNOSIS — E7800 Pure hypercholesterolemia, unspecified: Secondary | ICD-10-CM

## 2016-07-09 DIAGNOSIS — I6930 Unspecified sequelae of cerebral infarction: Secondary | ICD-10-CM

## 2016-07-09 DIAGNOSIS — I10 Essential (primary) hypertension: Secondary | ICD-10-CM

## 2016-07-09 LAB — BAYER DCA HB A1C WAIVED: HB A1C: 7.6 % — AB (ref <7.0)

## 2016-07-09 NOTE — Progress Notes (Signed)
Subjective:    Patient ID: Evan Stanley, male    DOB: 05-29-1941, 75 y.o.   MRN: [id number]  HPI Pt here for follow up and management of chronic medical problems which includes diabetes, hyperlipidemia and hypertension. He is taking medications regularly. Patient has had no further symptoms related to Salmonella. He has gained some weight. He is seeing a gastroenterologist related to the Salmonella diagnosis.   Patient Active Problem List   Diagnosis Date Noted  . Ileocolitis 04/30/2016  . Salmonella bacteremia 04/29/2016  . Sepsis ([place]) 04/29/2016  . CKD stage 3 secondary to diabetes ([place]) 04/29/2016  . CVA (cerebral vascular accident) ([place]) 04/28/2016  . Diarrhea 04/28/2016  . Late effects of CVA (cerebrovascular accident) 04/03/2016  . Symptomatic stenosis of right carotid artery 01/13/2016  . S/P aortic valve replacement with tissue 01/05/2016  . Benign essential HTN   . AKI (acute kidney injury) ([place])   . Type 2 diabetes mellitus with peripheral neuropathy (HCC)   . Carotid artery occlusion without infarction 10/26/2014  . Hyperlipidemia 07/05/2013  . Essential hypertension 07/05/2013  . CAD (coronary artery disease)   . Type 2 diabetes mellitus with stage 3 chronic kidney disease, without long-term current use of insulin [facility])    Outpatient Encounter Prescriptions as of 07/09/2016  Medication Sig  . amLODipine (NORVASC) 10 MG tablet Take 1 tablet (10 mg total) by mouth daily.  Marland Kitchen aspirin EC 81 MG tablet Take 1 tablet (81 mg total) by mouth daily.  Marland Kitchen atorvastatin (LIPITOR) 40 MG tablet Take 1 tablet (40 mg total) by mouth daily.  . cetirizine (ZYRTEC) 10 MG tablet Take 10 mg by mouth daily.  . hydrochlorothiazide (HYDRODIURIL) 25 MG tablet Take 1 tablet (25 mg total) by mouth daily.  Marland Kitchen latanoprost (XALATAN) 0.005 % ophthalmic solution Place 1 drop into the left eye at bedtime.   Marland Kitchen lisinopril (PRINIVIL,ZESTRIL) 40 MG tablet Take 1 tablet (40 mg total) by mouth daily.  .  metFORMIN (GLUCOPHAGE) 1000 MG tablet Take 1 tablet (1,000 mg total) by mouth 2 (two) times daily with a meal.  . metoprolol succinate (TOPROL-XL) 100 MG 24 hr tablet Take 1 tablet (100 mg total) by mouth daily. Take with or immediately following a meal.  . oxyCODONE-acetaminophen (PERCOCET/ROXICET) 5-325 MG tablet Take 1-2 tablets by mouth every 4 (four) hours as needed for moderate pain.  Marland Kitchen Pyridoxine HCl (VITAMIN B6 PO) Take by mouth daily.  . vitamin C (ASCORBIC ACID) 500 MG tablet Take 500 mg by mouth daily.  . vitamin E (VITAMIN E) 1000 UNIT capsule Take 1,000 Units by mouth daily.   No facility-administered encounter medications on file as of 07/09/2016.       Review of Systems  Constitutional: Negative.   HENT: Negative.   Eyes: Negative.   Respiratory: Negative.   Cardiovascular: Negative.   Gastrointestinal: Negative.   Endocrine: Negative.   Genitourinary: Negative.   Musculoskeletal: Negative.   Skin: Negative.   Allergic/Immunologic: Negative.   Neurological: Negative.   Hematological: Negative.   Psychiatric/Behavioral: Negative.        Objective:   Physical Exam  Constitutional: He is oriented to person, place, and time. He appears well-developed and well-nourished.  Cardiovascular: Normal rate and regular rhythm.   Pulmonary/Chest: Effort normal and breath sounds normal.  Abdominal: Soft. Bowel sounds are normal. There is no tenderness.  Neurological: He is alert and oriented to person, place, and time.  Psychiatric: He has a normal mood and affect. His behavior is normal.  BP 117/60 (BP Location: Right Arm)   Pulse 74   Temp (!) 96.7 F (35.9 C) (Oral)   Ht 5' 5"  (1.651 m)   Wt 176 lb (79.8 kg)   BMI 29.29 kg/m        Assessment & Plan:  1. Benign essential HTN Blood pressures well controlled on current regimen  2. Pure hypercholesterolemia We'll check lipids in 3 months at one year  3. Type 1 diabetes mellitus without complication  (HCC) Last A1c was very good. Do not expect significant change - Bayer DCA Hb A1c Waived  Wardell Honour MD

## 2016-07-09 NOTE — Patient Instructions (Signed)
Medicare Annual Wellness Visit  [place] and the medical providers at [facility] strive to bring you the best medical care.  In doing so we not only want to address your current medical conditions and concerns but also to detect new conditions early and prevent illness, disease and health-related problems.    Medicare offers a yearly Wellness Visit which allows our clinical staff to assess your need for preventative services including immunizations, lifestyle education, counseling to decrease risk of preventable diseases and screening for fall risk and other medical concerns.    This visit is provided free of charge (no copay) for all Medicare recipients. The clinical pharmacists at [facility] have begun to conduct these Wellness Visits which will also include a thorough review of all your medications.    As you primary medical provider recommend that you make an appointment for your Annual Wellness Visit if you have not done so already this year.  You may set up this appointment before you leave today or you may call back ([phone]) and schedule an appointment.  Please make sure when you call that you mention that you are scheduling your Annual Wellness Visit with the clinical pharmacist so that the appointment may be made for the proper length of time.     Continue current medications. Continue good therapeutic lifestyle changes which include good diet and exercise. Fall precautions discussed with patient. If an FOBT was given today- please return it to our front desk. If you are over 50 years old - you may need Prevnar 13 or the adult Pneumonia vaccine.  **Flu shots are available--- please call and schedule a FLU-CLINIC appointment**  After your visit with us today you will receive a survey in the mail or online from [employer] regarding your care with us. Please take a moment to fill this out. Your feedback is very  important to us as you can help us better understand your patient needs as well as improve your experience and satisfaction. WE CARE ABOUT YOU!!!    

## 2016-07-16 LAB — HM DIABETES EYE EXAM

## 2016-08-13 ENCOUNTER — Encounter: Payer: Self-pay | Admitting: Family Medicine

## 2016-08-13 DIAGNOSIS — Z1211 Encounter for screening for malignant neoplasm of colon: Secondary | ICD-10-CM | POA: Diagnosis not present

## 2016-08-14 ENCOUNTER — Telehealth: Payer: Self-pay | Admitting: Family Medicine

## 2016-08-18 ENCOUNTER — Ambulatory Visit (INDEPENDENT_AMBULATORY_CARE_PROVIDER_SITE_OTHER): Payer: BLUE CROSS/BLUE SHIELD | Admitting: Interventional Cardiology

## 2016-08-18 ENCOUNTER — Encounter (INDEPENDENT_AMBULATORY_CARE_PROVIDER_SITE_OTHER): Payer: Self-pay

## 2016-08-18 ENCOUNTER — Encounter: Payer: Self-pay | Admitting: Interventional Cardiology

## 2016-08-18 VITALS — BP 132/70 | HR 65 | Ht 67.0 in | Wt 176.4 lb

## 2016-08-18 DIAGNOSIS — E1122 Type 2 diabetes mellitus with diabetic chronic kidney disease: Secondary | ICD-10-CM

## 2016-08-18 DIAGNOSIS — N183 Chronic kidney disease, stage 3 unspecified: Secondary | ICD-10-CM

## 2016-08-18 DIAGNOSIS — I2510 Atherosclerotic heart disease of native coronary artery without angina pectoris: Secondary | ICD-10-CM

## 2016-08-18 DIAGNOSIS — I6521 Occlusion and stenosis of right carotid artery: Secondary | ICD-10-CM | POA: Diagnosis not present

## 2016-08-18 DIAGNOSIS — I10 Essential (primary) hypertension: Secondary | ICD-10-CM | POA: Diagnosis not present

## 2016-08-18 DIAGNOSIS — Z953 Presence of xenogenic heart valve: Secondary | ICD-10-CM

## 2016-08-18 NOTE — Patient Instructions (Signed)
Medication Instructions:  None  Labwork: None  Testing/Procedures: None  Follow-Up: Your physician wants you to follow-up in: 9-12 months. You will receive a reminder letter in the mail two months in advance. If you don't receive a letter, please call our office to schedule the follow-up appointment.   Any Other Special Instructions Will Be Listed Below (If Applicable).     If you need a refill on your cardiac medications before your next appointment, please call your pharmacy.

## 2016-08-18 NOTE — Progress Notes (Signed)
Cardiology Office Note    Date:  08/18/2016   ID:  Evan Stanley, DOB February 27, 1941, MRN [id number]  PCP:  Wardell Honour, MD  Cardiologist: Sinclair Grooms, MD   No chief complaint on file.   History of Present Illness:  Evan Stanley is a 76 y.o. male who presents for Follow-up of bioprosthetic aortic valve, coronary artery disease with prior bypass grafting, left ventricular systolic dysfunction. Since last office visit with me he has suffered a right cerebral stroke resulting in incoordination and left-sided weakness. This occurred in the setting of diagnostic carotid angiography by Dr. Bridgett Larsson.  Evan Stanley is somewhat frustrated. He has not been as active as previous because of the stroke. He did undergo successful right carotid endarterectomy. He has total occlusion of the left internal carotid. He denies angina. No heart failure complaints. He specifically denies edema, syncope, chest pain, and orthopnea.   Past Medical History:  Diagnosis Date  . Anxiety   . Arthritis    Right shoulder  . Atrial fibrillation ([place])   . Carotid artery stenosis   . CHF (congestive heart failure) ([place])   . Decreased vision    left eye  . Diabetes mellitus without complication ([place])    Takes Metformin  . Hyperlipidemia   . Hypertension   . Stroke ([place]) 11/18/2015   no deficits  . TIA (transient ischemic attack)   . Urgency of urination     Past Surgical History:  Procedure Laterality Date  . AORTIC VALVE REPLACEMENT    . BACK SURGERY  80s  . CARDIAC VALVE REPLACEMENT    . ENDARTERECTOMY Right 01/13/2016   Procedure: ENDARTERECTOMY CAROTID - RIGHT;  Surgeon: Conrad [place], MD;  Location: [facility];  Service: Vascular;  Laterality: Right;  . laser surgery, left eye  Left 10-29-2015    retinal surgery/ Deloria Lair MD   . PERIPHERAL VASCULAR CATHETERIZATION Right 11/18/2015   Procedure: Carotid Angiography;  Surgeon: Conrad Cumbola, MD;  Location: [facility];  Service: Cardiovascular;   Laterality: Right;  . PERIPHERAL VASCULAR CATHETERIZATION N/A 11/18/2015   Procedure: Aortic Arch Angiography;  Surgeon: Conrad Benld, MD;  Location: [facility];  Service: Cardiovascular;  Laterality: N/A;  . TEE WITHOUT CARDIOVERSION N/A 05/05/2016   Procedure: TRANSESOPHAGEAL ECHOCARDIOGRAM (TEE);  Surgeon: Lelon Perla, MD;  Location: [facility] ENDOSCOPY;  Service: Cardiovascular;  Laterality: N/A;    Current Medications: Outpatient Medications Prior to Visit  Medication Sig Dispense Refill  . amLODipine (NORVASC) 10 MG tablet Take 1 tablet (10 mg total) by mouth daily. 90 tablet 3  . aspirin EC 81 MG tablet Take 1 tablet (81 mg total) by mouth daily. 90 tablet 3  . atorvastatin (LIPITOR) 40 MG tablet Take 1 tablet (40 mg total) by mouth daily. 90 tablet 1  . cetirizine (ZYRTEC) 10 MG tablet Take 10 mg by mouth daily.    . hydrochlorothiazide (HYDRODIURIL) 25 MG tablet Take 1 tablet (25 mg total) by mouth daily. 90 tablet 3  . latanoprost (XALATAN) 0.005 % ophthalmic solution Place 1 drop into the left eye at bedtime.     Marland Kitchen lisinopril (PRINIVIL,ZESTRIL) 40 MG tablet Take 1 tablet (40 mg total) by mouth daily. 90 tablet 3  . metFORMIN (GLUCOPHAGE) 1000 MG tablet Take 1 tablet (1,000 mg total) by mouth 2 (two) times daily with a meal. 180 tablet 3  . metoprolol succinate (TOPROL-XL) 100 MG 24 hr tablet Take 1 tablet (100 mg total) by mouth  daily. Take with or immediately following a meal. 90 tablet 3  . Pyridoxine HCl (VITAMIN B6 PO) Take by mouth daily.    . vitamin C (ASCORBIC ACID) 500 MG tablet Take 500 mg by mouth daily.    . vitamin E (VITAMIN E) 1000 UNIT capsule Take 1,000 Units by mouth daily.    Marland Kitchen oxyCODONE-acetaminophen (PERCOCET/ROXICET) 5-325 MG tablet Take 1-2 tablets by mouth every 4 (four) hours as needed for moderate pain. 8 tablet 0   No facility-administered medications prior to visit.      Allergies:   Patient has no known allergies.   Social History   Social  History  . Marital status: Married    Spouse name: N/A  . Number of children: N/A  . Years of education: N/A   Social History Main Topics  . Smoking status: Former Smoker    Quit date: 07/20/1984  . Smokeless tobacco: Never Used  . Alcohol use No  . Drug use: No  . Sexual activity: Not Asked   Other Topics Concern  . None   Social History Narrative  . None     Family History:  The patient's family history includes Heart disease in his mother; Stroke (age of onset: 55) in his father.   ROS:   Please see the history of present illness.    No numbness and tingling in the left arm. Decreased energy and left-sided weakness.  All other systems reviewed and are negative.   PHYSICAL EXAM:   VS:  BP 132/70 (BP Location: Left Arm)   Pulse 65   Ht 5' 7"  (1.702 m)   Wt 176 lb 6.4 oz (80 kg)   BMI 27.63 kg/m    GEN: Well nourished, well developed, in no acute distress  HEENT: normal  Neck: no JVD, carotid bruits, or masses Cardiac: RRR; no murmurs, rubs, or gallops,no edema  Respiratory:  clear to auscultation bilaterally, normal work of breathing GI: soft, nontender, nondistended, + BS MS: no deformity or atrophy  Skin: warm and dry, no rash Neuro:  Alert and Oriented x 3, Strength and sensation are intact Psych: euthymic mood, full affect  Wt Readings from Last 3 Encounters:  08/18/16 176 lb 6.4 oz (80 kg)  07/09/16 176 lb (79.8 kg)  05/14/16 172 lb (78 kg)      Studies/Labs Reviewed:   EKG:  EKG  Normal sinus rhythm with nonspecific T-wave flattening.  Recent Labs: 04/29/2016: ALT 44 05/02/2016: Hemoglobin 12.4; Magnesium 1.9; Platelets 152 05/14/2016: BUN 11; Creatinine, Ser 1.12; Potassium 5.3; Sodium 143   Lipid Panel    Component Value Date/Time   CHOL 176 09/20/2015 0807   TRIG 147 09/20/2015 0807   HDL 37 (L) 09/20/2015 0807   CHOLHDL 4.8 09/20/2015 0807   LDLCALC 110 (H) 09/20/2015 0807    Additional studies/ records that were reviewed today  include:   CT angio of the head and neck April 2016: IMPRESSION: Estimated 60% stenosis RIGHT ICA most narrow approximately 1.5 cm above the origin.  Occluded LEFT ICA.  Echocardiogram 11/20/15: Study Conclusions  - Left ventricle: Posterior basal hypokinesis The cavity size was   normal. Wall thickness was normal. The estimated ejection   fraction was 55%. - Aortic valve: Normal appearing bioprosthetic AV. Valve area   (VTI): 1.09 cm^2. Valve area (Vmax): 0.95 cm^2. Valve area   (Vmean): 1.06 cm^2. - Mitral valve: Calcified annulus. Mildly thickened leaflets . - Left atrium: The atrium was mildly dilated. - Atrial septum: No defect  or patent foramen ovale was identified.  Impressions:  - No cardiac source of emboli was indentified.  Pharmacologic myocardial perfusion study 01/09/16: Study Highlights    Nuclear stress EF: 63%.  Findings consistent with prior myocardial infarction with peri-infarct ischemia.  This is a low risk study.  The left ventricular ejection fraction is normal (55-65%).   Small inferolateral wall infarct at mid and basal level with mild peri infarct ischemia.  EF 63%      ASSESSMENT:    1. Coronary artery disease involving native coronary artery of native heart without angina pectoris   2. Benign essential HTN   3. Obstruction of right carotid artery without cerebral infarction   4. Type 2 diabetes mellitus with stage 3 chronic kidney disease, without long-term current use of insulin ([place])   5. CKD stage 3 secondary to diabetes ([place])   6. S/P aortic valve replacement with tissue      PLAN:  In order of problems listed above:  1. From a cardiac standpoint with reference to coronary disease. Low risk study within the past 12 months. Continue aspirin daily and risk factor modification. 2. We discussed 2 g sodium diet. Blood pressure is within the target range. 3. Continue risk factor modification. Known chronic total occlusion of the  left internal carotid. He is now status post right carotid endarterectomy. Management of the right carotid disease was complicated by cerebral infarction with residual left-sided weakness. 4. Followed by primary care 5. Stable and last blood check 6. No evidence of prosthetic valve dysfunction. Please note echo results above.  Overall plan is clinical follow-up in one year. I encouraged aerobic activity. He has been through physical therapy for his CVA. He is encouraged to call of chest pain, palpitations, or dyspnea.  Medication Adjustments/Labs and Tests Ordered: Current medicines are reviewed at length with the patient today.  Concerns regarding medicines are outlined above.  Medication changes, Labs and Tests ordered today are listed in the Patient Instructions below. Patient Instructions  Medication Instructions:  None  Labwork: None  Testing/Procedures: None  Follow-Up: Your physician wants you to follow-up in: 9-12 months. You will receive a reminder letter in the mail two months in advance. If you don't receive a letter, please call our office to schedule the follow-up appointment.   Any Other Special Instructions Will Be Listed Below (If Applicable).     If you need a refill on your cardiac medications before your next appointment, please call your pharmacy.      Signed, Sinclair Grooms, MD  08/18/2016 12:33 PM    [facility] [place], [place], [place]  [address] Phone: [phone]; Fax: [phone]

## 2016-08-20 ENCOUNTER — Encounter (HOSPITAL_COMMUNITY): Payer: Self-pay | Admitting: Radiology

## 2016-08-20 ENCOUNTER — Telehealth (HOSPITAL_COMMUNITY): Payer: Self-pay | Admitting: Radiology

## 2016-08-20 NOTE — Telephone Encounter (Signed)
Not sure why we tried to schedule echo. Just had 1 done in MAY?  Did we call him by mistake? Did we miss scheduling someone else?  Pleas let him know that this was a error.

## 2016-08-20 NOTE — Telephone Encounter (Signed)
Spoke with pt and made him aware echo not needed.  Pt appreciative for call.

## 2016-08-20 NOTE — Telephone Encounter (Signed)
Thanks

## 2016-08-20 NOTE — Progress Notes (Unsigned)
Called patient to schedule echocardiogram.  Patient stated he does not want the test.

## 2016-08-20 NOTE — Telephone Encounter (Signed)
Called patient to schedule echocardiogram.  Patient stated he does not want the test.

## 2016-08-20 NOTE — Telephone Encounter (Signed)
I saw him in 08/2015 and scheduled for 1 year. This was an old order from last year before he had the on in the hospital in May. Thanks, [employer]

## 2016-10-06 NOTE — Progress Notes (Signed)
Subjective:    Patient ID: Evan Stanley, male    DOB: 1941-06-01, 76 y.o.   MRN: [id number]  HPI 76 year old gentleman who is followed for diabetes, hypertension, and hyperlipidemia. Recent checks of his blood sugar at home have all been very good by history. He is due for lipids today. Last LDL was 110 which is a little higher than goal. He is followed by cardiologist who he saw 2 months ago with a good report.  Patient Active Problem List   Diagnosis Date Noted  . Ileocolitis 04/30/2016  . Salmonella bacteremia 04/29/2016  . Sepsis ([place]) 04/29/2016  . CKD stage 3 secondary to diabetes ([place]) 04/29/2016  . CVA (cerebral vascular accident) ([place]) 04/28/2016  . Diarrhea 04/28/2016  . Late effects of CVA (cerebrovascular accident) 04/03/2016  . Symptomatic stenosis of right carotid artery 01/13/2016  . S/P aortic valve replacement with tissue 01/05/2016  . Benign essential HTN   . AKI (acute kidney injury) ([place])   . Carotid artery occlusion without infarction 10/26/2014  . Hyperlipidemia 07/05/2013  . Essential hypertension 07/05/2013  . CAD (coronary artery disease)   . Type 2 diabetes mellitus with stage 3 chronic kidney disease, without long-term current use of insulin [facility])    Outpatient Encounter Prescriptions as of 10/07/2016  Medication Sig  . amLODipine (NORVASC) 10 MG tablet Take 1 tablet (10 mg total) by mouth daily.  Marland Kitchen aspirin EC 81 MG tablet Take 1 tablet (81 mg total) by mouth daily.  Marland Kitchen atorvastatin (LIPITOR) 40 MG tablet Take 1 tablet (40 mg total) by mouth daily.  Marland Kitchen atropine 1 % ophthalmic solution Place 1 drop into the left eye daily.  . cetirizine (ZYRTEC) 10 MG tablet Take 10 mg by mouth daily.  . hydrochlorothiazide (HYDRODIURIL) 25 MG tablet Take 1 tablet (25 mg total) by mouth daily.  Marland Kitchen latanoprost (XALATAN) 0.005 % ophthalmic solution Place 1 drop into the left eye at bedtime.   Marland Kitchen lisinopril (PRINIVIL,ZESTRIL) 40 MG tablet Take 1 tablet (40 mg total) by mouth  daily.  . metFORMIN (GLUCOPHAGE) 1000 MG tablet Take 1 tablet (1,000 mg total) by mouth 2 (two) times daily with a meal.  . metoprolol succinate (TOPROL-XL) 100 MG 24 hr tablet Take 1 tablet (100 mg total) by mouth daily. Take with or immediately following a meal.  . Pyridoxine HCl (VITAMIN B6 PO) Take by mouth daily.  . vitamin C (ASCORBIC ACID) 500 MG tablet Take 500 mg by mouth daily.  . vitamin E (VITAMIN E) 1000 UNIT capsule Take 1,000 Units by mouth daily.   No facility-administered encounter medications on file as of 10/07/2016.       Review of Systems  Constitutional: Negative.   HENT: Negative.   Eyes: Negative.   Respiratory: Negative.  Negative for shortness of breath.   Cardiovascular: Negative.  Negative for chest pain and leg swelling.  Gastrointestinal: Negative.   Genitourinary: Negative.   Musculoskeletal: Negative.   Skin: Negative.   Neurological: Negative.   Psychiatric/Behavioral: Negative.   All other systems reviewed and are negative.      Objective:   Physical Exam  Constitutional: He appears well-developed and well-nourished.  Cardiovascular: Normal rate and regular rhythm.   Murmur heard. Pulmonary/Chest: Effort normal and breath sounds normal.   BP 122/64   Pulse 66   Temp 97.1 F (36.2 C) (Oral)   Ht 5' 7"  (1.702 m)   Wt 179 lb 9.6 oz (81.5 kg)   BMI 28.13 kg/m  Assessment & Plan:  1. Essential hypertension Blood pressure is well controlled on a multidrug regimen - CMP14+EGFR  2. Type 2 diabetes mellitus with stage 3 chronic kidney disease, without long-term current use of insulin (HCC) Last A1c was not at goal. Hopefully will be better today - Bayer DCA Hb A1c Waived - Microalbumin / creatinine urine ratio  3. Pure hypercholesterolemia Tolerates atorvastatin. Goal is to get LDL under 100. Wardell Honour MD

## 2016-10-07 ENCOUNTER — Ambulatory Visit (INDEPENDENT_AMBULATORY_CARE_PROVIDER_SITE_OTHER): Payer: Medicare Other | Admitting: Family Medicine

## 2016-10-07 ENCOUNTER — Encounter: Payer: Self-pay | Admitting: Family Medicine

## 2016-10-07 VITALS — BP 122/64 | HR 66 | Temp 97.1°F | Ht 67.0 in | Wt 179.6 lb

## 2016-10-07 DIAGNOSIS — E1122 Type 2 diabetes mellitus with diabetic chronic kidney disease: Secondary | ICD-10-CM | POA: Diagnosis not present

## 2016-10-07 DIAGNOSIS — N183 Chronic kidney disease, stage 3 (moderate): Secondary | ICD-10-CM

## 2016-10-07 DIAGNOSIS — I10 Essential (primary) hypertension: Secondary | ICD-10-CM | POA: Diagnosis not present

## 2016-10-07 DIAGNOSIS — E7800 Pure hypercholesterolemia, unspecified: Secondary | ICD-10-CM

## 2016-10-07 LAB — CMP14+EGFR
ALBUMIN: 4.6 g/dL (ref 3.5–4.8)
ALK PHOS: 64 IU/L (ref 39–117)
ALT: 25 IU/L (ref 0–44)
AST: 22 IU/L (ref 0–40)
Albumin/Globulin Ratio: 2.1 (ref 1.2–2.2)
BILIRUBIN TOTAL: 0.5 mg/dL (ref 0.0–1.2)
BUN / CREAT RATIO: 12 (ref 10–24)
BUN: 20 mg/dL (ref 8–27)
CHLORIDE: 97 mmol/L (ref 96–106)
CO2: 25 mmol/L (ref 18–29)
Calcium: 10.1 mg/dL (ref 8.6–10.2)
Creatinine, Ser: 1.61 mg/dL — ABNORMAL HIGH (ref 0.76–1.27)
GFR calc non Af Amer: 42 mL/min/{1.73_m2} — ABNORMAL LOW (ref >59)
GFR, EST AFRICAN AMERICAN: 48 mL/min/{1.73_m2} — AB (ref >59)
GLOBULIN, TOTAL: 2.2 g/dL (ref 1.5–4.5)
GLUCOSE: 242 mg/dL — AB (ref 65–99)
POTASSIUM: 4 mmol/L (ref 3.5–5.2)
SODIUM: 141 mmol/L (ref 134–144)
TOTAL PROTEIN: 6.8 g/dL (ref 6.0–8.5)

## 2016-10-07 LAB — BAYER DCA HB A1C WAIVED: HB A1C (BAYER DCA - WAIVED): 9.6 % — ABNORMAL HIGH (ref <7.0)

## 2016-10-07 MED ORDER — ATORVASTATIN CALCIUM 40 MG PO TABS: 40.0000 mg | ORAL_TABLET | Freq: Every day | ORAL | 0 refills | Status: DC

## 2016-10-07 MED ORDER — METFORMIN HCL 1000 MG PO TABS: 1000.0000 mg | ORAL_TABLET | Freq: Two times a day (BID) | ORAL | 0 refills | Status: DC

## 2016-10-08 ENCOUNTER — Ambulatory Visit (INDEPENDENT_AMBULATORY_CARE_PROVIDER_SITE_OTHER): Payer: Medicare Other | Admitting: Pharmacist

## 2016-10-08 ENCOUNTER — Encounter: Payer: Self-pay | Admitting: Pharmacist

## 2016-10-08 DIAGNOSIS — E1122 Type 2 diabetes mellitus with diabetic chronic kidney disease: Secondary | ICD-10-CM

## 2016-10-08 DIAGNOSIS — N183 Chronic kidney disease, stage 3 (moderate): Secondary | ICD-10-CM | POA: Diagnosis not present

## 2016-10-08 LAB — MICROALBUMIN / CREATININE URINE RATIO
Creatinine, Urine: 224.3 mg/dL
MICROALB/CREAT RATIO: 81.6 mg/g{creat} — AB (ref 0.0–30.0)
Microalbumin, Urine: 183.1 ug/mL

## 2016-10-08 MED ORDER — BLOOD GLUCOSE MONITOR KIT: PACK | 5 refills | Status: DC

## 2016-10-08 NOTE — Progress Notes (Signed)
Patient ID: Evan Stanley, male   DOB: [birth year], 76 y.o.   MRN: [id number]   Subjective:    Evan Stanley is a 76 y.o. male who presents for an initial evaluation of Type 2 diabetes mellitus.  [name] was referred by Dr Sabra Heck.  The patient was initially diagnosed with Type 2 diabetes mellitus in 2010.   His last A1c was 9.6% (10/08/2106) and had increased from 7.6% 07/09/2016).  He is not checking BG at home.  He is currently taking only metformin 1072m bid for diabetes.   He reports that he has been eating a lot of candy and Little Debbie cakes and cookies.  He believes this is why his BG is elevated.  His wife is present with him today and agrees that he has been eating a lot more sweets lately.   Current symptoms/problems include hyperglycemia and polyuria and have been unchanged. Symptoms have been present for 2 months.  Known diabetic complications: nephropathy, cardiovascular disease and cerebrovascular disease Cardiovascular risk factors: advanced age (older than 563for men, 628for women), diabetes mellitus, dyslipidemia, hypertension, male gender, obesity (BMI >= 30 kg/m2) and sedentary lifestyle  Eye exam current (within one year): unknown Weight trend: increasing steadily Prior visit with CDE: yes - but was in 2010 when he was initially diagnosed with tyep 2 DM Current diet: in general, an "unhealthy" diet Current exercise: none Medication Compliance?  Yes  Current monitoring regimen: none - received glucometer when initially diagnosed but no longer has glucometer. Home blood sugar records: none Any episodes of hypoglycemia? no  Is He on ACE inhibitor or angiotensin II receptor blocker?  Yes  lisinopril (Zestril)    The following portions of the patient's history were reviewed and updated as appropriate: allergies, current medications and problem list.    Objective:    There were no vitals taken for this visit.  Lab Review Glucose (mg/dL)  Date Value  10/07/2016 242  (H)  05/14/2016 153 (H)   Glucose, Bld (mg/dL)  Date Value  05/05/2016 213 (H)  05/05/2016 130 (H)  05/04/2016 195 (H)   CO2 (mmol/L)  Date Value  10/07/2016 25  05/14/2016 30 (H)  05/05/2016 25   BUN (mg/dL)  Date Value  10/07/2016 20  05/14/2016 11  05/05/2016 16  05/05/2016 15  05/04/2016 15  09/20/2015 12   Creatinine, Ser (mg/dL)  Date Value  10/07/2016 1.61 (H)  05/14/2016 1.12  05/05/2016 1.13   A1c = 9.6% (03/21/218)    Assessment:    Diabetes Mellitus type II, under inadequate control.    Plan:    1.  Rx changes: none 2.  Education: Reviewed 'ABCs' of diabetes management (respective goals in parentheses):  A1C (<7), blood pressure (<130/80), and cholesterol (LDL <100). 3. Discussed pathophysiology of DM; difference between type 1 and type 2 DM. 4. CHO counting diet discussed.  Reviewed CHO amount in various foods and how to read nutrition labels.  Discussed recommended serving sizes.  5.  Recommend check BG 2  times a day 6.  Recommended increase physical activity - goal is 150 minutes per week 7. Follow up: 3 weeks

## 2016-10-08 NOTE — Patient Instructions (Signed)
Diabetes and Standards of Medical Care   Diabetes is complicated. You may find that your diabetes team includes a dietitian, nurse, diabetes educator, eye doctor, and more. To help everyone know what is going on and to help you get the care you deserve, the following schedule of care was developed to help keep you on track. Below are the tests, exams, vaccines, medicines, education, and plans you will need.  Blood Glucose Goals Prior to meals = 80 - 130 Within 2 hours of the start of a meal = less than 180  HbA1c test (goal is less than 7.0% - your last value was 9.6%) This test shows how well you have controlled your glucose over the past 2 to 3 months. It is used to see if your diabetes management plan needs to be adjusted.   It is performed at least 2 times a year if you are meeting treatment goals.  It is performed 4 times a year if therapy has changed or if you are not meeting treatment goals.  Blood pressure test  This test is performed at every routine medical visit. The goal is less than 140/90 mmHg for most people, but 130/80 mmHg in some cases. Ask your health care provider about your goal.  Dental exam  Follow up with the dentist regularly.  Eye exam  If you are diagnosed with type 1 diabetes as a child, get an exam upon reaching the age of 56 years or older and have had diabetes for 3 to 5 years. Yearly eye exams are recommended after that initial eye exam.  If you are diagnosed with type 1 diabetes as an adult, get an exam within 5 years of diagnosis and then yearly.  If you are diagnosed with type 2 diabetes, get an exam as soon as possible after the diagnosis and then yearly.  Foot care exam  Visual foot exams are performed at every routine medical visit. The exams check for cuts, injuries, or other problems with the feet.  A comprehensive foot exam should be done yearly. This includes visual inspection as well as assessing foot pulses and testing for loss of  sensation.  Check your feet nightly for cuts, injuries, or other problems with your feet. Tell your health care provider if anything is not healing.  Kidney function test (urine microalbumin)  This test is performed once a year.  Type 1 diabetes: The first test is performed 5 years after diagnosis.  Type 2 diabetes: The first test is performed at the time of diagnosis.  A serum creatinine and estimated glomerular filtration rate (eGFR) test is done once a year to assess the level of chronic kidney disease (CKD), if present.  Lipid profile (cholesterol, HDL, LDL, triglycerides)  Performed every 5 years for most people.  The goal for LDL is less than 100 mg/dL. If you are at high risk, the goal is less than 70 mg/dL.  The goal for HDL is 40 mg/dL to 50 mg/dL for men and 50 mg/dL to 60 mg/dL for women. An HDL cholesterol of 60 mg/dL or higher gives some protection against heart disease.  The goal for triglycerides is less than 150 mg/dL.  Influenza vaccine, pneumococcal vaccine, and hepatitis B vaccine  The influenza vaccine is recommended yearly.  The pneumococcal vaccine is generally given once in a lifetime. However, there are some instances when another vaccination is recommended. Check with your health care provider.  The hepatitis B vaccine is also recommended for adults with diabetes.  Diabetes self-management education  Education is recommended at diagnosis and ongoing as needed.  Treatment plan  Your treatment plan is reviewed at every medical visit.  Document Released: 05/03/2009 Document Revised: 03/08/2013 Document Reviewed: 12/06/2012 [employer] Patient Information 2014 [employer].   

## 2016-10-22 ENCOUNTER — Encounter: Payer: Self-pay | Admitting: Vascular Surgery

## 2016-10-29 NOTE — Progress Notes (Signed)
Established Carotid Patient  History of Present Illness  Evan Stanley is a 76 y.o. (1940/11/09) male who presents with chief complaint: none.  Pt is s/p R CEA /w BPA  For sx R ICA stenosis >90% (01/13/16).  Previous carotid studies demonstrated: RICA >09% stenosis, LICA occluded stenosis.  This patient's prior sx included: L eye visual field loss, <1 hour episode of slurring of speech. His most recent sx were: discoordination of LLE. The patient's previous neurologic deficits have resolved.  The patient has had no events since his surgery  The patient's PMH, PSH, SH, and FamHx are unchanged from 01/13/16.  Current Outpatient Prescriptions  Medication Sig Dispense Refill  . amLODipine (NORVASC) 10 MG tablet Take 1 tablet (10 mg total) by mouth daily. 90 tablet 3  . aspirin EC 81 MG tablet Take 1 tablet (81 mg total) by mouth daily. 90 tablet 3  . atorvastatin (LIPITOR) 40 MG tablet Take 1 tablet (40 mg total) by mouth daily. 90 tablet 0  . atropine 1 % ophthalmic solution Place 1 drop into the left eye daily.  6  . blood glucose meter kit and supplies KIT Dispense based on patient and insurance preference. Use to check BG once a day.  Dx: type 2 DM E11.9 1 each 5  . cetirizine (ZYRTEC) 10 MG tablet Take 10 mg by mouth daily.    . hydrochlorothiazide (HYDRODIURIL) 25 MG tablet Take 1 tablet (25 mg total) by mouth daily. 90 tablet 3  . latanoprost (XALATAN) 0.005 % ophthalmic solution Place 1 drop into the left eye at bedtime.     Marland Kitchen lisinopril (PRINIVIL,ZESTRIL) 40 MG tablet Take 1 tablet (40 mg total) by mouth daily. 90 tablet 3  . metFORMIN (GLUCOPHAGE) 1000 MG tablet Take 1 tablet (1,000 mg total) by mouth 2 (two) times daily with a meal. 180 tablet 0  . metoprolol succinate (TOPROL-XL) 100 MG 24 hr tablet Take 1 tablet (100 mg total) by mouth daily. Take with or immediately following a meal. 90 tablet 3  . Pyridoxine HCl (VITAMIN B6 PO) Take by mouth daily.    . vitamin C (ASCORBIC ACID)  500 MG tablet Take 500 mg by mouth daily.    . vitamin E (VITAMIN E) 1000 UNIT capsule Take 1,000 Units by mouth daily.     No current facility-administered medications for this visit.     No Known Allergies  On ROS today: no difficulty swallowing, no jawline numbness   Physical Examination  Vitals:   10/30/16 1145  BP: 128/70  Pulse: 64  Resp: 16  Temp: 98 F (36.7 C)  SpO2: 97%  Weight: 175 lb (79.4 kg)  Height: 5' 5" (1.651 m)    Body mass index is 29.12 kg/m.  General: Alert, O x 3, WD,NAD  Eyes: PERRLA, EOMI,   Neck: Supple, mid-line trachea,    Pulmonary: Sym exp, good B air movt,CTA B  Cardiac: RRR, Nl S1, S2, no Murmurs, No rubs, No S3,S4  Vascular: Vessel Right Left  Radial Palpable Palpable  Brachial Palpable Palpable  Carotid Palpable, No Bruit Palpable, No Bruit  Aorta Not palpable N/A  Femoral Palpable Palpable  Popliteal Not palpable Not palpable  PT Palpable Palpable  DP Palpable Palpable   Gastrointestinal: soft, non-distended, non-tender to palpation, No guarding or rebound, no HSM, no masses, no CVAT B, No palpable prominent aortic pulse,    Musculoskeletal: M/S 5/5 throughout  , Extremities without ischemic changes  , No edema present,  ,  No LDS present  Neurologic: CN 2-12 intact , Pain and light touch intact in extremities , Motor exam as listed above   Non-Invasive Vascular Imaging  CAROTID DUPLEX (Date: 10/30/2016):   R ICA stenosis: mild hyperplasia (162/43 c/s, morphology does not match 40-59% stenosis by velocity criteria, suspect increase flow due to contralateral occlusion or tortuousity)  R VA: patent and antegrade  L ICA stenosis: occluded  L VA: patent and antegrade   Medical Decision Making  Evan Stanley is a 76 y.o. male who presents with: R CEA for sx R ICA stenosis >80%, occluded L ICA, s/p R carotid angiogram complicated by CVA   Based on the patient's vascular studies and examination, I have offered the  patient: annual B carotid duplex.  I discussed in depth with the patient the nature of atherosclerosis, and emphasized the importance of maximal medical management including strict control of blood pressure, blood glucose, and lipid levels, antiplatelet agents, obtaining regular exercise, and cessation of smoking.    The patient is aware that without maximal medical management the underlying atherosclerotic disease process will progress, limiting the benefit of any interventions. The patient is currently on a statin: Pravachol.  The patient is currently on an anti-platelet: ASA.  Thank you for allowing [place] to participate in this patient's care.   [name], MD, FACS [facility] Office: [phone] Pager: [phone]  -- VASCULAR QUALITY INITIATIVE FOLLOW UP DATA:   Current smoker: [  ] yes  [ x ] no  Living status: [ x ]  Home  [  ] Nursing home  [  ] Homeless    MEDS:  ASA [x  ] yes  [  ] no- [  ] medical reason  [  ] non compliant  STATIN  [ x ] yes  [  ] no- [  ] medical reason  [  ] non compliant  Beta blocker [  ] yes  [ x ] no- [  ] medical reason  [  ] non compliant  ACE inhibitor [  ] yes  [ x ] no- [  ] medical reason  [  ] non compliant  P2Y12 Antagonist [x  ] none  [  ] clopidogrel-Plavix  [  ] ticlopidine-Ticlid   [  ] prasugrel-Effient  [  ] ticagrelor- Brilinta    Anticoagulant [x  ] None  [  ] warfarin  [  ] rivaroxaban-Xarelto [  ] dabigatran- Pradaxa  Neurologic event since D/C:  [  ] no  [  ] yes: [  ] eye event  [  ] cortical event  [  ] VB event  [  ] non specific event  [  ] right  [  ] left  [  ] TIA  [  ] stroke  Date:   Modified Rankin Score: 0  MI since D/C: [  x] no  [  ] troponin only  [  ] EKG or clinical  Cranial nerve injury: [ x ] none  [  ] resolved  [  ] persistent  Duplex CEA site: [  ] no  [ x ] yes - PSV= 72  EDV= 14  ICA/CCA ratio: 0.75  Stenosis= [x  ] <40% [  ] 40-59% [  ] 60-79%  [  ] > 80%  [  ]   Occluded  CEA site re-operation:  [ x ] no   [  ] yes- date of  re-op:  CEA site PCI:   [ x ] no   [  ] yes- date of PCI:

## 2016-10-30 ENCOUNTER — Encounter: Payer: Self-pay | Admitting: Vascular Surgery

## 2016-10-30 ENCOUNTER — Ambulatory Visit (HOSPITAL_COMMUNITY)
Admission: RE | Admit: 2016-10-30 | Discharge: 2016-10-30 | Disposition: A | Discharge status: Home / Self Care | Payer: Medicare Other | Source: Ambulatory Visit | Attending: Vascular Surgery | Admitting: Vascular Surgery

## 2016-10-30 ENCOUNTER — Ambulatory Visit (INDEPENDENT_AMBULATORY_CARE_PROVIDER_SITE_OTHER): Payer: Medicare Other | Admitting: Vascular Surgery

## 2016-10-30 VITALS — BP 128/70 | HR 64 | Temp 98.0°F | Resp 16 | Ht 65.0 in | Wt 175.0 lb

## 2016-10-30 DIAGNOSIS — I739 Peripheral vascular disease, unspecified: Secondary | ICD-10-CM

## 2016-10-30 DIAGNOSIS — I779 Disorder of arteries and arterioles, unspecified: Secondary | ICD-10-CM

## 2016-11-03 NOTE — Addendum Note (Signed)
Addended by: Lianne Cure A on: 11/03/2016 08:59 AM   Modules accepted: Orders

## 2016-11-04 ENCOUNTER — Encounter: Payer: Self-pay | Admitting: Pharmacist

## 2016-11-04 ENCOUNTER — Ambulatory Visit (INDEPENDENT_AMBULATORY_CARE_PROVIDER_SITE_OTHER): Payer: Medicare Other | Admitting: Pharmacist

## 2016-11-04 VITALS — BP 130/64 | HR 72 | Ht 65.0 in | Wt 177.0 lb

## 2016-11-04 DIAGNOSIS — Z0000 Encounter for general adult medical examination without abnormal findings: Secondary | ICD-10-CM

## 2016-11-04 MED ORDER — GLIMEPIRIDE 2 MG PO TABS: 2.0000 mg | ORAL_TABLET | Freq: Every day | ORAL | 1 refills | Status: DC

## 2016-11-04 NOTE — Progress Notes (Signed)
Patient ID: Evan Stanley, male   DOB: 1941-05-27, 76 y.o.   MRN: [id number]     Subjective:   Evan Stanley is a 76 y.o. male who presents for an Initial Medicare Annual Wellness Visit and to reviewed BG.  I last saw Evan Stanley about 3 weeks ago due to elevated A1c.  We discussed decreasing CHO and sugar in diet and he states that he is no longer eating [employer] cookies however later in that visit he mentions eating fig bars from [employer].   His BG has improved some but FBG still over goal.  7 day avg = 199 14 day avg = 197 30 da avg = 191 Only check BG in am.   He also mentions that he has had a sore throat in the morning and drainage over the last 2-3 days.  He takes cetirizine for allergies.  He has not taken any other meds but asks about Nyquil   Social History: Occupational history: retired Marital history: married (wife is present with patient today) Alcohol/Tobacco/Substances: not currently   Current Medications (verified) Outpatient Encounter Prescriptions as of 11/04/2016  Medication Sig  . amLODipine (NORVASC) 10 MG tablet Take 1 tablet (10 mg total) by mouth daily.  Marland Kitchen aspirin EC 81 MG tablet Take 1 tablet (81 mg total) by mouth daily.  Marland Kitchen atorvastatin (LIPITOR) 40 MG tablet Take 1 tablet (40 mg total) by mouth daily.  Marland Kitchen atropine 1 % ophthalmic solution Place 1 drop into the left eye daily.  . cetirizine (ZYRTEC) 10 MG tablet Take 10 mg by mouth daily.  . hydrochlorothiazide (HYDRODIURIL) 25 MG tablet Take 1 tablet (25 mg total) by mouth daily.  Marland Kitchen latanoprost (XALATAN) 0.005 % ophthalmic solution Place 1 drop into the left eye at bedtime.   Marland Kitchen lisinopril (PRINIVIL,ZESTRIL) 40 MG tablet Take 1 tablet (40 mg total) by mouth daily.  . metFORMIN (GLUCOPHAGE) 1000 MG tablet Take 1 tablet (1,000 mg total) by mouth 2 (two) times daily with a meal.  . metoprolol succinate (TOPROL-XL) 100 MG 24 hr tablet Take 1 tablet (100 mg total) by mouth daily. Take with or immediately  following a meal.  . ONE TOUCH ULTRA TEST test strip USE TO CHECK BG ONCE DAILY  . ONETOUCH DELICA LANCETS 32G MISC TEST BLOOD GLUCOSE ONCE DAILY  . Pyridoxine HCl (VITAMIN B6 PO) Take by mouth daily.  . vitamin C (ASCORBIC ACID) 500 MG tablet Take 500 mg by mouth daily.  . vitamin E (VITAMIN E) 1000 UNIT capsule Take 1,000 Units by mouth daily.  Marland Kitchen glimepiride (AMARYL) 2 MG tablet Take 1 tablet (2 mg total) by mouth daily before breakfast.  . [DISCONTINUED] blood glucose meter kit and supplies KIT Dispense based on patient and insurance preference. Use to check BG once a day.  Dx: type 2 DM E11.9  . [DISCONTINUED] glimepiride (AMARYL) 2 MG tablet Take 1 tablet (2 mg total) by mouth daily before breakfast.   No facility-administered encounter medications on file as of 11/04/2016.     Allergies (verified) Patient has no known allergies.   History: Past Medical History:  Diagnosis Date  . Allergy   . Anxiety   . Arthritis    Right shoulder  . Atrial fibrillation ([place])   . Carotid artery stenosis   . Cataract   . CHF (congestive heart failure) ([place])   . Decreased vision    left eye  . Diabetes mellitus without complication ([place])    Takes Metformin  . Hyperlipidemia   .  Hypertension   . Stroke ([place]) 11/18/2015   no deficits  . TIA (transient ischemic attack)    affected left eye  . Urgency of urination    Past Surgical History:  Procedure Laterality Date  . AORTIC VALVE REPLACEMENT    . BACK SURGERY  80s  . CARDIAC VALVE REPLACEMENT  2010  . ENDARTERECTOMY Right 01/13/2016   Procedure: ENDARTERECTOMY CAROTID - RIGHT;  Surgeon: Conrad Silverton, MD;  Location: [place];  Service: Vascular;  Laterality: Right;  . EYE SURGERY    . laser surgery, left eye  Left 10-29-2015    retinal surgery/ Deloria Lair MD   . PERIPHERAL VASCULAR CATHETERIZATION Right 11/18/2015   Procedure: Carotid Angiography;  Surgeon: Conrad [place], MD;  Location: [facility];  Service: Cardiovascular;   Laterality: Right;  . PERIPHERAL VASCULAR CATHETERIZATION N/A 11/18/2015   Procedure: Aortic Arch Angiography;  Surgeon: Conrad [place], MD;  Location: [facility];  Service: Cardiovascular;  Laterality: N/A;  . TEE WITHOUT CARDIOVERSION N/A 05/05/2016   Procedure: TRANSESOPHAGEAL ECHOCARDIOGRAM (TEE);  Surgeon: Lelon Perla, MD;  Location[facility] END[facility];  Service: Cardiovascular;  Laterality: N/A;   Family History  Problem Relation Age of Onset  . Heart disease Mother   . Cancer Mother     breast  . Stroke Father 34  . Hypertension Father   . Early death Sister     infant death  . Heart disease Brother   . Heart disease Brother   . Diabetes Brother   . Cancer Brother     prostat   Social History   Occupational History  . Not on file.   Social History Main Topics  . Smoking status: Former Smoker    Quit date: 07/20/1986  . Smokeless tobacco: Never Used  . Alcohol use No  . Drug use: No  . Sexual activity: Yes    Do you feel safe at home?  Yes Are there smokers in your home (other than you)? No  Dietary issues and exercise activities discussed: Current Exercise Habits: The patient does not participate in regular exercise at present  Current Dietary habits:  Breakfast - yogurt and fig bar (checked amount of CHO in fig bar and is 31g), eggs  Lunch - sandwich  Supper - eats out at [employer] or other restarant.  Usually get sandwich + FF or vegetables + meat  Likes cornbread and potatoes Cardiac Risk Factors include: advanced age (>47mn, >>49women);diabetes mellitus;dyslipidemia;family history of premature cardiovascular disease;hypertension;male gender;microalbuminuria;obesity (BMI >30kg/m2);sedentary lifestyle  Objective:    Today's Vitals   11/04/16 0911  BP: 130/64  Pulse: 72  Weight: 177 lb (80.3 kg)  Height: 5' 5"  (1.651 m)  PainSc: 0-No pain   Body mass index is 29.45 kg/m.   Activities of Daily Living In your present state of health, do you have any  difficulty performing the following activities: 11/04/2016 04/28/2016  Hearing? N N  Vision? Y Y  Difficulty concentrating or making decisions? N Y  Walking or climbing stairs? Y Y  Dressing or bathing? N Y  Doing errands, shopping? N N  Preparing Food and eating ? N -  Using the Toilet? N -  In the past six months, have you accidently leaked urine? N -  Do you have problems with loss of bowel control? N -  Managing your Medications? N -  Managing your Finances? N -  Housekeeping or managing your Housekeeping? N -  Some recent data might  be hidden     Depression Screen PHQ 2/9 Scores 11/04/2016 10/07/2016 07/09/2016 05/14/2016  PHQ - 2 Score 0 0 0 0     Fall Risk Fall Risk  11/04/2016 10/07/2016 07/09/2016 05/14/2016 12/27/2015  Falls in the past year? No No Yes Yes No  Number falls in past yr: - - 1 1 -  Injury with Fall? - - No No -    Cognitive Function: MMSE - Mini Mental State Exam 11/04/2016  Orientation to time 4  Orientation to Place 5  Registration 3  Attention/ Calculation 1  Recall 3  Language- name 2 objects 2  Language- repeat 1  Language- follow 3 step command 3  Language- read & follow direction 1  Write a sentence 1  Copy design 1  Total score 25    Immunizations and Health Maintenance Immunization History  Administered Date(s) Administered  . Influenza Whole 05/26/2010  . Influenza,inj,Quad PF,36+ Mos 06/14/2013, 04/16/2014, 06/18/2015  . Influenza-Unspecified 05/14/2016  . Pneumococcal Conjugate-13 06/18/2015  . Tdap 03/05/2011   Health Maintenance Due  Topic Date Due  . PNA vac Low Risk Adult (1 of 2 - PCV13) 01/24/2008    Patient Care Team: Wardell Honour, MD as PCP - General (Family Medicine) Belva Crome, MD as Consulting Physician (Cardiology) Conrad Gillespie, MD as Consulting Physician (Vascular Surgery) Hurman Horn, MD as Consulting Physician (Ophthalmology) Clent Jacks, MD as Consulting Physician (Ophthalmology)  Indicate any  recent Medical Services you may have received from other than [facility] providers in the past year (date may be approximate).    Assessment:    Annual Wellness Visit  Uncontrolled DM  Screening Tests Health Maintenance  Topic Date Due  . PNA vac Low Risk Adult (1 of 2 - PCV13) 01/24/2008  . INFLUENZA VACCINE  02/17/2017  . HEMOGLOBIN A1C  04/09/2017  . OPHTHALMOLOGY EXAM  07/16/2017  . COLON CANCER SCREENING ANNUAL FOBT  08/13/2017  . FOOT EXAM  10/07/2017  . TETANUS/TDAP  03/04/2021        Plan:   During the course of the visit Fl[place] was educated and counseled about the following appropriate screening and preventive services:   Vaccines to include Pneumoccal, Influenza,  Td, and Shingles - pt declined Shingles vaccine.  [facility] had record of Prevnar 13 from 06/18/2015 (updated in our records) will check his paper chart to verify if Pneumonvax 23 was given in past.  Colorectal cancer screening - UTD  Cardiovascular disease screening - TUD  Diabetes - add glimepiride 53m qam (discussed other options but patient requested something that is generic)  Glaucoma screening / Diabetic Eye Exam - UTD  Nutrition counseling - reviewed CHO counting and how to look at label to estimate CHO's.  Recommended if he eat fig bar to only eat 1/2 at a time.   Advanced Directives - declined  Physical Activity- recommend he increase activity - he has plans to start golfing again.   Try nasal spry decongestant for congestion.  Continue cetirizine 168mdaily for allergies.  Mucinex 60059mid if has chest congestion.  If continues to have symptoms then call office for appt with PCP.    Patient Instructions (the written plan) were given to the patient.   TamCherre RobinsharmD   11/04/2016

## 2016-11-04 NOTE — Patient Instructions (Addendum)
  [name] , Thank you for taking time to come for your Medicare Wellness Visit. I appreciate your ongoing commitment to your health goals. Please review the following plan we discussed and let me know if I can assist you in the future.   These are the goals we discussed:  certizine - antihistamine / allergies / drainage (allegra, claritin) Benadryl at night - makes you sleepy Afrin nasal spray - decongestant spray (3 days only) Mucinex - for chest congestion / cough Avoid decongestant in pill form - they can increase your heart rate  Start glimepiride 63m - take 1 tablet once each morning.  Restart playing golf and increase exercise / walking.  Start with 10 minutes and work up to 30 minutes a day or 150 minutes per week  Increase non-starchy vegetables - carrots, green bean, squash, zucchini, tomatoes, onions, peppers, spinach and other green leafy vegetables, cabbage, lettuce, cucumbers, asparagus, okra (not fried), eggplant Limit sugar and processed foods (cakes, cookies, ice cream, crackers and chips) Increase fresh fruit but limit serving sizes 1/2 cup or about the size of tennis or baseball Limit red meat to no more than 1-2 times per week (serving size about the size of your palm) Choose whole grains / lean proteins - whole wheat bread, quinoa, whole grain rice (1/2 cup), fish, chicken, t[place]Avoid sugar and calorie containing beverages - soda, sweet tea and juice.  Choose water or unsweetened tea instead.  Blood Glucose Goals Prior to meals = 80 - 130 Within 2 hours of the start of a meal = less than 180   This is a list of the screening recommended for you and due dates:  Health Maintenance  Topic Date Due  . Pneumonia vaccines (1 of 2 - PCV13) 01/24/2008 - Prevnar 13 was given in 2016; will check your paper chart for Pneumovax 23.   .Marland KitchenFlu Shot  02/17/2017  . Hemoglobin A1C  04/09/2017  . Eye exam for diabetics  07/16/2017  . Stool Blood Test  08/13/2017  . Complete  foot exam   10/07/2017  . Tetanus Vaccine  03/04/2021

## 2016-11-25 ENCOUNTER — Other Ambulatory Visit: Payer: Self-pay | Admitting: Family Medicine

## 2016-11-30 ENCOUNTER — Other Ambulatory Visit: Payer: Self-pay

## 2016-11-30 MED ORDER — LISINOPRIL 40 MG PO TABS: 40.0000 mg | ORAL_TABLET | Freq: Every day | ORAL | 0 refills | Status: DC

## 2016-12-13 ENCOUNTER — Other Ambulatory Visit: Payer: Self-pay | Admitting: Family Medicine

## 2016-12-16 ENCOUNTER — Ambulatory Visit (INDEPENDENT_AMBULATORY_CARE_PROVIDER_SITE_OTHER): Payer: Medicare Other | Admitting: Pharmacist

## 2016-12-16 VITALS — BP 128/66 | HR 68 | Ht 65.0 in | Wt 178.0 lb

## 2016-12-16 DIAGNOSIS — N183 Chronic kidney disease, stage 3 (moderate): Secondary | ICD-10-CM | POA: Diagnosis not present

## 2016-12-16 DIAGNOSIS — R7989 Other specified abnormal findings of blood chemistry: Secondary | ICD-10-CM

## 2016-12-16 DIAGNOSIS — E1122 Type 2 diabetes mellitus with diabetic chronic kidney disease: Secondary | ICD-10-CM | POA: Diagnosis not present

## 2016-12-16 DIAGNOSIS — Z23 Encounter for immunization: Secondary | ICD-10-CM

## 2016-12-16 MED ORDER — GLIMEPIRIDE 2 MG PO TABS: 3.0000 mg | ORAL_TABLET | Freq: Every day | ORAL | 1 refills | Status: DC

## 2016-12-16 NOTE — Progress Notes (Signed)
Patient ID: Evan Stanley, male   DOB: 11-26-1940, 76 y.o.   MRN: [id number]   Subjective:    Evan Stanley is a 76 y.o. male who presents for a follow up evaluation of Type 2 diabetes mellitus.   At our last visit glimepiride 4m 1 tablet qam was added to his diabetes regimen which also includes metformin 10061mbid Patient also received CHO counting / serving size education.   Current monitoring regimen: home blood tests - once daily Home blood sugar records: fasting range: 120's to 180's  Average over the last 30 days has been 145 Previous average at last visit was in 190's  Any episodes of hypoglycemia? no  Known diabetic complications: nephropathy, cardiovascular disease and cerebrovascular disease Cardiovascular risk factors: advanced age (older than 5571or men, 6591or women), diabetes mellitus, dyslipidemia, hypertension, male gender, microalbuminuria, obesity (BMI >= 30 kg/m2) and sedentary lifestyle Eye exam current (within one year): yes Weight trend: stable Prior visit with CDE: yes -   Current diet: patient has limited sugar and cakes over the last month.  Current exercise: none Medication Compliance?  Yes   Is He on ACE inhibitor or angiotensin II receptor blocker?  Yes  lisinopril (Zestril)    The following portions of the patient's history were reviewed and updated as appropriate: allergies, current medications and problem list.    Objective:    There were no vitals taken for this visit.  Lab Review Glucose (mg/dL)  Date Value  10/07/2016 242 (H)  05/14/2016 153 (H)   Glucose, Bld (mg/dL)  Date Value  05/05/2016 213 (H)  05/05/2016 130 (H)  05/04/2016 195 (H)   CO2 (mmol/L)  Date Value  10/07/2016 25  05/14/2016 30 (H)  05/05/2016 25   BUN (mg/dL)  Date Value  10/07/2016 20  05/14/2016 11  05/05/2016 16  05/05/2016 15  05/04/2016 15  09/20/2015 12   Creatinine, Ser (mg/dL)  Date Value  10/07/2016 1.61 (H)  05/14/2016 1.12  05/05/2016 1.13       Assessment:    Diabetes Mellitus type II, under improving control.   Stage 3 CKD   Plan:    1.  Rx changes: increase glimepiride to 77m65m1 and 1/2 tablets of 2mg97mnce daily   Continue metformin 1000mg67m 2. Continue to count  CHO and limit intake of high CHO foods 3.  Recommend check BG 2  times a day 4.  Recommended increase physical activity - goal is 150 minutes per week. Discussed going to silver sneakers program which is covered by patient's insurance.  [facility] 6.  BMET checked today - pending 7.  Follow up: 1 month with PCP and as needed with me

## 2016-12-17 LAB — BMP8+EGFR
BUN / CREAT RATIO: 13 (ref 10–24)
BUN: 19 mg/dL (ref 8–27)
CHLORIDE: 100 mmol/L (ref 96–106)
CO2: 26 mmol/L (ref 18–29)
Calcium: 10 mg/dL (ref 8.6–10.2)
Creatinine, Ser: 1.42 mg/dL — ABNORMAL HIGH (ref 0.76–1.27)
GFR calc non Af Amer: 49 mL/min/{1.73_m2} — ABNORMAL LOW (ref >59)
GFR, EST AFRICAN AMERICAN: 56 mL/min/{1.73_m2} — AB (ref >59)
GLUCOSE: 239 mg/dL — AB (ref 65–99)
POTASSIUM: 4.5 mmol/L (ref 3.5–5.2)
Sodium: 139 mmol/L (ref 134–144)

## 2016-12-24 ENCOUNTER — Other Ambulatory Visit: Payer: Self-pay | Admitting: *Deleted

## 2016-12-24 MED ORDER — ONETOUCH DELICA LANCETS 33G MISC: 5 refills | Status: DC

## 2016-12-24 MED ORDER — ONETOUCH ULTRA BLUE VI STRP: ORAL_STRIP | 5 refills | Status: DC

## 2016-12-24 NOTE — Telephone Encounter (Signed)
Per 5/30 OV note pt is now checking sugars BID New Rx for lancets & strips sent to  pharmacy

## 2017-01-14 DIAGNOSIS — H211X2 Other vascular disorders of iris and ciliary body, left eye: Secondary | ICD-10-CM | POA: Diagnosis not present

## 2017-01-14 DIAGNOSIS — E119 Type 2 diabetes mellitus without complications: Secondary | ICD-10-CM | POA: Diagnosis not present

## 2017-01-14 DIAGNOSIS — H34232 Retinal artery branch occlusion, left eye: Secondary | ICD-10-CM | POA: Diagnosis not present

## 2017-01-14 DIAGNOSIS — H43822 Vitreomacular adhesion, left eye: Secondary | ICD-10-CM | POA: Diagnosis not present

## 2017-01-17 ENCOUNTER — Other Ambulatory Visit: Payer: Self-pay | Admitting: Family Medicine

## 2017-01-17 DIAGNOSIS — I10 Essential (primary) hypertension: Secondary | ICD-10-CM

## 2017-01-27 ENCOUNTER — Other Ambulatory Visit: Payer: Self-pay | Admitting: Family Medicine

## 2017-02-05 DIAGNOSIS — H2511 Age-related nuclear cataract, right eye: Secondary | ICD-10-CM | POA: Diagnosis not present

## 2017-02-05 DIAGNOSIS — H401123 Primary open-angle glaucoma, left eye, severe stage: Secondary | ICD-10-CM | POA: Diagnosis not present

## 2017-02-05 DIAGNOSIS — H35372 Puckering of macula, left eye: Secondary | ICD-10-CM | POA: Diagnosis not present

## 2017-02-05 DIAGNOSIS — H04123 Dry eye syndrome of bilateral lacrimal glands: Secondary | ICD-10-CM | POA: Diagnosis not present

## 2017-02-05 DIAGNOSIS — Z961 Presence of intraocular lens: Secondary | ICD-10-CM | POA: Diagnosis not present

## 2017-02-05 LAB — HM DIABETES EYE EXAM

## 2017-02-09 ENCOUNTER — Ambulatory Visit: Payer: Medicare Other | Admitting: Family Medicine

## 2017-02-11 ENCOUNTER — Encounter: Payer: Self-pay | Admitting: Family Medicine

## 2017-02-16 ENCOUNTER — Encounter: Payer: Self-pay | Admitting: Family Medicine

## 2017-02-16 ENCOUNTER — Encounter (INDEPENDENT_AMBULATORY_CARE_PROVIDER_SITE_OTHER): Payer: Self-pay

## 2017-02-16 ENCOUNTER — Ambulatory Visit (INDEPENDENT_AMBULATORY_CARE_PROVIDER_SITE_OTHER): Payer: Medicare Other | Admitting: Family Medicine

## 2017-02-16 VITALS — BP 133/71 | HR 58 | Temp 97.4°F | Ht 65.0 in | Wt 180.8 lb

## 2017-02-16 DIAGNOSIS — E7800 Pure hypercholesterolemia, unspecified: Secondary | ICD-10-CM

## 2017-02-16 DIAGNOSIS — E1122 Type 2 diabetes mellitus with diabetic chronic kidney disease: Secondary | ICD-10-CM | POA: Diagnosis not present

## 2017-02-16 DIAGNOSIS — N183 Chronic kidney disease, stage 3 unspecified: Secondary | ICD-10-CM

## 2017-02-16 DIAGNOSIS — I10 Essential (primary) hypertension: Secondary | ICD-10-CM | POA: Diagnosis not present

## 2017-02-16 LAB — BAYER DCA HB A1C WAIVED: HB A1C (BAYER DCA - WAIVED): 7.5 % — ABNORMAL HIGH (ref <7.0)

## 2017-02-16 MED ORDER — METOPROLOL SUCCINATE ER 100 MG PO TB24: ORAL_TABLET | ORAL | 3 refills | Status: DC

## 2017-02-16 MED ORDER — METFORMIN HCL 1000 MG PO TABS: ORAL_TABLET | ORAL | 3 refills | Status: DC

## 2017-02-16 MED ORDER — AMLODIPINE BESYLATE 10 MG PO TABS: 10.0000 mg | ORAL_TABLET | Freq: Every day | ORAL | 3 refills | Status: DC

## 2017-02-16 NOTE — Progress Notes (Signed)
   HPI  Patient presents today here to establish care, his previous PCP has retired.  Also to review chronic medical conditions.  Diabetes Good medication compliance, has increased his glimepiride 3 mg once daily. Average fasting blood sugar is 140-160 No hypoglycemia. He is watching his diet carefully.  Hypertension Good medication compliance, no side effects. Needs refills.  PMH: Smoking status noted ROS: Per HPI  Objective: BP 133/71   Pulse (!) 58   Temp (!) 97.4 F (36.3 C) (Oral)   Ht 5' 5"  (1.651 m)   Wt 180 lb 12.8 oz (82 kg)   BMI 30.09 kg/m  Gen: NAD, alert, cooperative with exam HEENT: NCAT CV: RRR, good S1/S2, no murmur Resp: CTABL, no wheezes, non-labored Ext: No edema, warm Neuro: Alert and oriented, No gross deficits  Assessment and plan:  # T2DM Control improved, No change in meds, A1C 7.5 ( from 9.6) Continue metformin and glimepride Check Cre q3-6 months woith metformin and CKD  # CKD stage 3 Checking labs, avoid NSAIDs Discussed tylenol for R shoulder pain that is mild and intermittent  # HTN Well controlled on norvasc, HCTZ, lisinopril, And toprol  # HLD  LDL goal less than 70, Continue Lipitor 40 for now, may need to ncrease    Orders Placed This Encounter  Procedures  . Bayer DCA Hb A1c Waived  . LDL Cholesterol, Direct  . CMP14+EGFR    Meds ordered this encounter  Medications  . amLODipine (NORVASC) 10 MG tablet    Sig: Take 1 tablet (10 mg total) by mouth daily.    Dispense:  90 tablet    Refill:  3  . metFORMIN (GLUCOPHAGE) 1000 MG tablet    Sig: TAKE 1 TABLET BY MOUTH TWO  TIMES DAILY WITH A MEAL    Dispense:  180 tablet    Refill:  3  . metoprolol succinate (TOPROL-XL) 100 MG 24 hr tablet    Sig: TAKE 1 TABLET BY MOUTH  DAILY WITH OR IMMEDIATELY  FOLLOWING A MEAL    Dispense:  90 tablet    Refill:  3    Laroy Apple, MD [facility] 02/16/2017, 11:53 AM

## 2017-02-16 NOTE — Patient Instructions (Signed)
Great to meet you!  Come back in 3-4 month sunless you need [place] ooner.   Great job watching your diet, your A1C is 7.5! Keep up the good work

## 2017-02-17 LAB — CMP14+EGFR
ALBUMIN: 4.5 g/dL (ref 3.5–4.8)
ALT: 27 IU/L (ref 0–44)
AST: 23 IU/L (ref 0–40)
Albumin/Globulin Ratio: 2 (ref 1.2–2.2)
Alkaline Phosphatase: 59 IU/L (ref 39–117)
BILIRUBIN TOTAL: 0.5 mg/dL (ref 0.0–1.2)
BUN / CREAT RATIO: 13 (ref 10–24)
BUN: 21 mg/dL (ref 8–27)
CHLORIDE: 103 mmol/L (ref 96–106)
CO2: 23 mmol/L (ref 20–29)
CREATININE: 1.67 mg/dL — AB (ref 0.76–1.27)
Calcium: 10.4 mg/dL — ABNORMAL HIGH (ref 8.6–10.2)
GFR calc non Af Amer: 40 mL/min/{1.73_m2} — ABNORMAL LOW (ref >59)
GFR, EST AFRICAN AMERICAN: 46 mL/min/{1.73_m2} — AB (ref >59)
GLOBULIN, TOTAL: 2.2 g/dL (ref 1.5–4.5)
GLUCOSE: 152 mg/dL — AB (ref 65–99)
Potassium: 5.6 mmol/L — ABNORMAL HIGH (ref 3.5–5.2)
SODIUM: 143 mmol/L (ref 134–144)
TOTAL PROTEIN: 6.7 g/dL (ref 6.0–8.5)

## 2017-02-17 LAB — LDL CHOLESTEROL, DIRECT: LDL Direct: 62 mg/dL (ref 0–99)

## 2017-02-19 ENCOUNTER — Other Ambulatory Visit: Payer: Self-pay | Admitting: *Deleted

## 2017-02-19 DIAGNOSIS — E875 Hyperkalemia: Secondary | ICD-10-CM

## 2017-02-25 ENCOUNTER — Other Ambulatory Visit: Payer: Medicare Other

## 2017-02-25 DIAGNOSIS — E875 Hyperkalemia: Secondary | ICD-10-CM | POA: Diagnosis not present

## 2017-02-25 LAB — BMP8+EGFR
BUN / CREAT RATIO: 15 (ref 10–24)
BUN: 25 mg/dL (ref 8–27)
CHLORIDE: 100 mmol/L (ref 96–106)
CO2: 22 mmol/L (ref 20–29)
Calcium: 9.3 mg/dL (ref 8.6–10.2)
Creatinine, Ser: 1.65 mg/dL — ABNORMAL HIGH (ref 0.76–1.27)
GFR calc non Af Amer: 40 mL/min/{1.73_m2} — ABNORMAL LOW (ref >59)
GFR, EST AFRICAN AMERICAN: 47 mL/min/{1.73_m2} — AB (ref >59)
GLUCOSE: 253 mg/dL — AB (ref 65–99)
POTASSIUM: 4.8 mmol/L (ref 3.5–5.2)
SODIUM: 137 mmol/L (ref 134–144)

## 2017-03-21 ENCOUNTER — Other Ambulatory Visit: Payer: Self-pay | Admitting: Family Medicine

## 2017-03-23 ENCOUNTER — Other Ambulatory Visit: Payer: Self-pay | Admitting: *Deleted

## 2017-03-23 MED ORDER — GLIMEPIRIDE 2 MG PO TABS: 3.0000 mg | ORAL_TABLET | Freq: Every day | ORAL | 1 refills | Status: DC

## 2017-04-06 ENCOUNTER — Other Ambulatory Visit: Payer: Self-pay | Admitting: Family Medicine

## 2017-05-12 ENCOUNTER — Ambulatory Visit (INDEPENDENT_AMBULATORY_CARE_PROVIDER_SITE_OTHER): Payer: Medicare Other | Admitting: Interventional Cardiology

## 2017-05-12 ENCOUNTER — Encounter: Payer: Self-pay | Admitting: Interventional Cardiology

## 2017-05-12 VITALS — BP 138/70 | HR 61 | Ht 66.0 in | Wt 180.1 lb

## 2017-05-12 DIAGNOSIS — E7849 Other hyperlipidemia: Secondary | ICD-10-CM | POA: Diagnosis not present

## 2017-05-12 DIAGNOSIS — I10 Essential (primary) hypertension: Secondary | ICD-10-CM

## 2017-05-12 DIAGNOSIS — E1122 Type 2 diabetes mellitus with diabetic chronic kidney disease: Secondary | ICD-10-CM

## 2017-05-12 DIAGNOSIS — N183 Chronic kidney disease, stage 3 unspecified: Secondary | ICD-10-CM

## 2017-05-12 DIAGNOSIS — Z953 Presence of xenogenic heart valve: Secondary | ICD-10-CM

## 2017-05-12 DIAGNOSIS — I25810 Atherosclerosis of coronary artery bypass graft(s) without angina pectoris: Secondary | ICD-10-CM | POA: Diagnosis not present

## 2017-05-12 MED ORDER — HYDROCHLOROTHIAZIDE 25 MG PO TABS: 25.0000 mg | ORAL_TABLET | Freq: Every day | ORAL | 3 refills | Status: DC

## 2017-05-12 MED ORDER — LISINOPRIL 40 MG PO TABS: 40.0000 mg | ORAL_TABLET | Freq: Every day | ORAL | 3 refills | Status: DC

## 2017-05-12 NOTE — Patient Instructions (Signed)
Medication Instructions:  Your physician recommends that you continue on your current medications as directed. Please refer to the Current Medication list given to you today.   Labwork: None   Testing/Procedures: None   Follow-Up: Your physician wants you to follow-up in 1 year with Dr. Drew. You will receive a reminder letter in the mail two months in advance. If you don't receive a letter, please call our office to schedule the follow-up appointment.   Any Other Special Instructions Will Be Listed Below (If Applicable).     If you need a refill on your cardiac medications before your next appointment, please call your pharmacy.   

## 2017-05-12 NOTE — Progress Notes (Signed)
Cardiology Office Note    Date:  05/12/2017   ID:  Evan Stanley, DOB 1941/06/25, MRN [id number]  PCP:  Timmothy Euler, MD  Cardiologist: Sinclair Grooms, MD   Chief Complaint  Patient presents with  . Cardiac Valve Problem  . Coronary Artery Disease    History of Present Illness:  Evan Stanley is a 76 y.o. male for follow-up of bioprosthetic aortic valve, coronary artery disease with prior bypass grafting, left ventricular systolic dysfunction. Since last office visit with me he has suffered a right cerebral stroke resulting in incoordination and left-sided weakness.   He is doing okay. Not able to play golf because of exertional in balance and fatigue. Not able to play golf. Denies chest pain and shortness of breath. He is not have edema. He has not fallen. No head trauma. No angina.   Past Medical History:  Diagnosis Date  . AKI (acute kidney injury) ([place])   . Allergy   . Anxiety   . Arthritis    Right shoulder  . Atrial fibrillation ([place])   . Carotid artery stenosis   . Cataract   . CHF (congestive heart failure) ([place])   . CVA (cerebral vascular accident) ([place]) 04/28/2016  . Decreased vision    left eye  . Diabetes mellitus without complication ([place])    Takes Metformin  . Hyperlipidemia   . Hypertension   . Salmonella bacteremia 04/29/2016  . Sepsis ([place]) 04/29/2016  . Stroke ([place]) 11/18/2015   no deficits  . TIA (transient ischemic attack)    affected left eye  . Urgency of urination     Past Surgical History:  Procedure Laterality Date  . AORTIC VALVE REPLACEMENT    . BACK SURGERY  80s  . CARDIAC VALVE REPLACEMENT  2010  . ENDARTERECTOMY Right 01/13/2016   Procedure: ENDARTERECTOMY CAROTID - RIGHT;  Surgeon: Conrad Yaak, MD;  Location: [facility];  Service: Vascular;  Laterality: Right;  . EYE SURGERY    . laser surgery, left eye  Left 10-29-2015    retinal surgery/ Deloria Lair MD   . PERIPHERAL VASCULAR CATHETERIZATION Right 11/18/2015   Procedure:  Carotid Angiography;  Surgeon: Conrad [place], MD;  Location: [facility];  Service: Cardiovascular;  Laterality: Right;  . PERIPHERAL VASCULAR CATHETERIZATION N/A 11/18/2015   Procedure: Aortic Arch Angiography;  Surgeon: Conrad [place], MD;  Location: [facility];  Service: Cardiovascular;  Laterality: N/A;  . TEE WITHOUT CARDIOVERSION N/A 05/05/2016   Procedure: TRANSESOPHAGEAL ECHOCARDIOGRAM (TEE);  Surgeon: Lelon Perla, MD;  Location[facility] ENDOSCOPY;  Service: Cardiovascular;  Laterality: N/A;    Current Medications: Outpatient Medications Prior to Visit  Medication Sig Dispense Refill  . amLODipine (NORVASC) 10 MG tablet Take 1 tablet (10 mg total) by mouth daily. 90 tablet 3  . aspirin EC 81 MG tablet Take 1 tablet (81 mg total) by mouth daily. 90 tablet 3  . atorvastatin (LIPITOR) 40 MG tablet TAKE 1 TABLET BY MOUTH  DAILY 90 tablet 2  . atropine 1 % ophthalmic solution Place 1 drop into the left eye daily.   6  . glimepiride (AMARYL) 2 MG tablet Take 1.5 tablets (3 mg total) by mouth daily before breakfast. 135 tablet 1  . latanoprost (XALATAN) 0.005 % ophthalmic solution Place 1 drop into the left eye at bedtime.     . metFORMIN (GLUCOPHAGE) 1000 MG tablet TAKE 1 TABLET BY MOUTH TWO  TIMES DAILY WITH A MEAL 180 tablet  3  . metoprolol succinate (TOPROL-XL) 100 MG 24 hr tablet TAKE 1 TABLET BY MOUTH  DAILY WITH OR IMMEDIATELY  FOLLOWING A MEAL 90 tablet 3  . ONE TOUCH ULTRA TEST test strip Use to check BG twice daily 100 each 5  . ONETOUCH DELICA LANCETS 98P MISC Use to check BG twice daily 100 each 5  . Pyridoxine HCl (VITAMIN B6 PO) Take by mouth daily.    . vitamin C (ASCORBIC ACID) 500 MG tablet Take 500 mg by mouth daily.    . vitamin E (VITAMIN E) 1000 UNIT capsule Take 1,000 Units by mouth daily.    . hydrochlorothiazide (HYDRODIURIL) 25 MG tablet TAKE 1 TABLET BY MOUTH  DAILY 90 tablet 0  . lisinopril (PRINIVIL,ZESTRIL) 40 MG tablet TAKE 1 TABLET BY MOUTH  DAILY 90  tablet 0   No facility-administered medications prior to visit.      Allergies:   Patient has no known allergies.   Social History   Social History  . Marital status: Married    Spouse name: N/A  . Number of children: N/A  . Years of education: N/A   Social History Main Topics  . Smoking status: Former Smoker    Quit date: 07/20/1986  . Smokeless tobacco: Never Used  . Alcohol use No  . Drug use: No  . Sexual activity: Yes   Other Topics Concern  . None   Social History Narrative  . None     Family History:  The patient's family history includes Cancer in his brother and mother; Diabetes in his brother; Early death in his sister; Heart disease in his brother, brother, and mother; Hypertension in his father; Stroke (age of onset: 78) in his father.   ROS:   Please see the history of present illness.    Difficulty with balance. Weakness on the left side residual effect of prior stroke.  All other systems reviewed and are negative.   PHYSICAL EXAM:   VS:  BP 138/70 (BP Location: Right Arm)   Pulse 61   Ht 5' 6"  (1.676 m)   Wt 180 lb 1.9 oz (81.7 kg)   BMI 29.07 kg/m    GEN: Well nourished, well developed, in no acute distress  HEENT: normal  Neck: no JVD, carotid bruits, or masses Cardiac: RRR; there is a 2/6 systolic  murmur, but no rub, or gallops,no edema  Respiratory:  clear to auscultation bilaterally, normal work of breathing GI: soft, nontender, nondistended, + BS MS: no deformity or atrophy  Skin: warm and dry, no rash Neuro:  Alert and Oriented x 3, Strength and sensation are intact Psych: euthymic mood, full affect  Wt Readings from Last 3 Encounters:  05/12/17 180 lb 1.9 oz (81.7 kg)  02/16/17 180 lb 12.8 oz (82 kg)  12/16/16 178 lb (80.7 kg)      Studies/Labs Reviewed:   EKG:  EKG  Sinus rhythm, nonspecific T wave flattening, otherwise normal. Baseline artifact due to tremor.  Recent Labs: 02/16/2017: ALT 27 02/25/2017: BUN 25; Creatinine, Ser  1.65; Potassium 4.8; Sodium 137   Lipid Panel    Component Value Date/Time   CHOL 176 09/20/2015 0807   TRIG 147 09/20/2015 0807   HDL 37 (L) 09/20/2015 0807   CHOLHDL 4.8 09/20/2015 0807   LDLCALC 110 (H) 09/20/2015 0807   LDLDIRECT 62 02/16/2017 1151    Additional studies/ records that were reviewed today include:  none    ASSESSMENT:    1. S/P aortic valve  replacement with tissue   2. Coronary artery disease involving coronary bypass graft of native heart without angina pectoris   3. Essential hypertension   4. Other hyperlipidemia   5. Type 2 diabetes mellitus with stage 3 chronic kidney disease, without long-term current use of insulin (HCC)      PLAN:  In order of problems listed above:  1. Valve function is clinically normal. No regurgitation is heard. A soft systolic murmur is heard. No functional testing required at this time. 2. No symptoms to suggest angina. Notified to call if chest pain or unexplained dyspnea. 3. Excellent current blood pressure on medical regimen is noted. 4. LDL target less than 70. Most recently was 53 on 02/16/2017. 5. Hemoglobin A1c was 7.5 when evaluated earlier this summer. Dietary changes have been made. This is being followed by primary care doctor Kenn File.  Clinical follow-up in one year. No change in therapy.    Medication Adjustments/Labs and Tests Ordered: Current medicines are reviewed at length with the patient today.  Concerns regarding medicines are outlined above.  Medication changes, Labs and Tests ordered today are listed in the Patient Instructions below. Patient Instructions  Medication Instructions:  Your physician recommends that you continue on your current medications as directed. Please refer to the Current Medication list given to you today.  Labwork: None  Testing/Procedures: None  Follow-Up: Your physician wants you to follow-up in: 1 year with Dr. [name].  You will receive a reminder letter in  the mail two months in advance. If you don't receive a letter, please call our office to schedule the follow-up appointment.   Any Other Special Instructions Will Be Listed Below (If Applicable).     If you need a refill on your cardiac medications before your next appointment, please call your pharmacy.      Signed, Sinclair Grooms, MD  05/12/2017 10:10 AM    [facility] [address], [place], [place]  [address] Phone: [phone]; Fax: [phone]

## 2017-05-20 ENCOUNTER — Ambulatory Visit (INDEPENDENT_AMBULATORY_CARE_PROVIDER_SITE_OTHER): Payer: Medicare Other | Admitting: Family Medicine

## 2017-05-20 ENCOUNTER — Encounter: Payer: Self-pay | Admitting: Family Medicine

## 2017-05-20 VITALS — BP 108/52 | HR 62 | Temp 97.1°F | Ht 66.0 in | Wt 181.0 lb

## 2017-05-20 DIAGNOSIS — R195 Other fecal abnormalities: Secondary | ICD-10-CM | POA: Diagnosis not present

## 2017-05-20 DIAGNOSIS — N183 Chronic kidney disease, stage 3 (moderate): Secondary | ICD-10-CM

## 2017-05-20 DIAGNOSIS — Z23 Encounter for immunization: Secondary | ICD-10-CM

## 2017-05-20 DIAGNOSIS — E1122 Type 2 diabetes mellitus with diabetic chronic kidney disease: Secondary | ICD-10-CM

## 2017-05-20 LAB — BAYER DCA HB A1C WAIVED: HB A1C: 7.1 % — AB (ref <7.0)

## 2017-05-20 MED ORDER — METFORMIN HCL 1000 MG PO TABS: ORAL_TABLET | ORAL | 3 refills | Status: DC

## 2017-05-20 MED ORDER — LISINOPRIL 40 MG PO TABS: 40.0000 mg | ORAL_TABLET | Freq: Every day | ORAL | 3 refills | Status: DC

## 2017-05-20 MED ORDER — GLIMEPIRIDE 2 MG PO TABS: 3.0000 mg | ORAL_TABLET | Freq: Every day | ORAL | 1 refills | Status: DC

## 2017-05-20 MED ORDER — HYDROCHLOROTHIAZIDE 25 MG PO TABS: 25.0000 mg | ORAL_TABLET | Freq: Every day | ORAL | 3 refills | Status: DC

## 2017-05-20 NOTE — Progress Notes (Signed)
   HPI  Patient presents today to follow-up for chronic medical conditions.  Type 2 diabetes Average fasting blood sugar is 130-140 No lows He is watching his diet, he exercises recreationally but no formal exercise routine. Blood pressure at home is average 130s systolic No headache or chest pain.  Patient does complain of some loose stools intermittently, states that 70% of the time he has loose stools.  He cannot tell any aggravating or alleviating factors. He has intermittent incontinence since his stroke.    PMH: Smoking status noted ROS: Per HPI  Objective: BP (!) 108/52   Pulse 62   Temp (!) 97.1 F (36.2 C) (Oral)   Ht 5' 6" (1.676 m)   Wt 181 lb (82.1 kg)   BMI 29.21 kg/m  Gen: NAD, alert, cooperative with exam HEENT: NCAT CV: RRR, good S1/S2, no murmur Resp: CTABL, no wheezes, non-labored Ext: No edema, warm Neuro: Alert and oriented, No gross deficits  Assessment and plan:  #Type 2 diabetes A1c pending, likely reasonable control Discontinue metformin times 1 week to see if this improves his stooling. Continue Amaryl.   #Chronic kidney disease stage III Watching carefully with metformin use Repeat labs Avoid NSAIDs  #Loose stools Unclear etiology, possible metformin side effect Could also consider bulk forming laxative-Metamucil   Orders Placed This Encounter  Procedures  . Bayer DCA Hb A1c Waived  . CMP14+EGFR  . CBC with Differential/Platelet    Meds ordered this encounter  Medications  . glimepiride (AMARYL) 2 MG tablet    Sig: Take 1.5 tablets (3 mg total) by mouth daily before breakfast.    Dispense:  135 tablet    Refill:  1  . hydrochlorothiazide (HYDRODIURIL) 25 MG tablet    Sig: Take 1 tablet (25 mg total) by mouth daily.    Dispense:  90 tablet    Refill:  3  . lisinopril (PRINIVIL,ZESTRIL) 40 MG tablet    Sig: Take 1 tablet (40 mg total) by mouth daily.    Dispense:  90 tablet    Refill:  3  . metFORMIN (GLUCOPHAGE) 1000  MG tablet    Sig: TAKE 1 TABLET BY MOUTH TWO  TIMES DAILY WITH A MEAL    Dispense:  180 tablet    Refill:  3    Sam , MD [facility] 05/20/2017, 9:25 AM     

## 2017-05-20 NOTE — Patient Instructions (Signed)
Great to see you!  Try to stop metformin for a week and see ifthis improves your  Bowels. Call if it does and we will try an alternative.

## 2017-05-21 LAB — SPECIMEN STATUS REPORT

## 2017-05-22 LAB — CMP14+EGFR
ALBUMIN: 4.5 g/dL (ref 3.5–4.8)
ALT: 24 IU/L (ref 0–44)
AST: 19 IU/L (ref 0–40)
Albumin/Globulin Ratio: 2.5 — ABNORMAL HIGH (ref 1.2–2.2)
Alkaline Phosphatase: 57 IU/L (ref 39–117)
BILIRUBIN TOTAL: 0.6 mg/dL (ref 0.0–1.2)
BUN / CREAT RATIO: 15 (ref 10–24)
BUN: 25 mg/dL (ref 8–27)
CALCIUM: 9.6 mg/dL (ref 8.6–10.2)
CHLORIDE: 101 mmol/L (ref 96–106)
CO2: 22 mmol/L (ref 20–29)
CREATININE: 1.7 mg/dL — AB (ref 0.76–1.27)
GFR, EST AFRICAN AMERICAN: 45 mL/min/{1.73_m2} — AB (ref >59)
GFR, EST NON AFRICAN AMERICAN: 39 mL/min/{1.73_m2} — AB (ref >59)
GLUCOSE: 143 mg/dL — AB (ref 65–99)
Globulin, Total: 1.8 g/dL (ref 1.5–4.5)
Potassium: 4.7 mmol/L (ref 3.5–5.2)
Sodium: 142 mmol/L (ref 134–144)
TOTAL PROTEIN: 6.3 g/dL (ref 6.0–8.5)

## 2017-05-22 LAB — CBC WITH DIFFERENTIAL/PLATELET
BASOS ABS: 0 10*3/uL (ref 0.0–0.2)
Basos: 0 %
EOS (ABSOLUTE): 0.3 10*3/uL (ref 0.0–0.4)
EOS: 3 %
HEMATOCRIT: 40.9 % (ref 37.5–51.0)
HEMOGLOBIN: 12.7 g/dL — AB (ref 13.0–17.7)
IMMATURE GRANS (ABS): 0 10*3/uL (ref 0.0–0.1)
Immature Granulocytes: 0 %
LYMPHS ABS: 2.1 10*3/uL (ref 0.7–3.1)
LYMPHS: 23 %
MCH: 26.2 pg — ABNORMAL LOW (ref 26.6–33.0)
MCHC: 31.1 g/dL — AB (ref 31.5–35.7)
MCV: 84 fL (ref 79–97)
MONOCYTES: 9 %
Monocytes Absolute: 0.8 10*3/uL (ref 0.1–0.9)
NEUTROS ABS: 5.7 10*3/uL (ref 1.4–7.0)
Neutrophils: 65 %
Platelets: 221 10*3/uL (ref 150–379)
RBC: 4.85 x10E6/uL (ref 4.14–5.80)
RDW: 15.5 % — AB (ref 12.3–15.4)
WBC: 9 10*3/uL (ref 3.4–10.8)

## 2017-05-27 ENCOUNTER — Telehealth: Payer: Self-pay | Admitting: Family Medicine

## 2017-05-27 MED ORDER — SITAGLIPTIN PHOSPHATE 50 MG PO TABS: 50.0000 mg | ORAL_TABLET | Freq: Every day | ORAL | 3 refills | Status: DC

## 2017-05-27 NOTE — Telephone Encounter (Signed)
I am glad to hear he is improved off of metfromin.   Start [place], 1 pill once daily.   Dosed at 50 mg for GFR 38.   Laroy Apple, MD [facility] 05/27/2017, 12:03 PM

## 2017-05-27 NOTE — Telephone Encounter (Signed)
LMTCB

## 2017-06-08 NOTE — Telephone Encounter (Signed)
Januvia sent to pharmacy

## 2017-06-14 ENCOUNTER — Telehealth: Payer: Self-pay | Admitting: Family Medicine

## 2017-06-14 NOTE — Telephone Encounter (Signed)
Pt's wife called stating pt's BS have been greater than 240 fasting since starting the Januvia 55m and stopping the metformin due to the side effects.

## 2017-06-14 NOTE — Telephone Encounter (Signed)
Called and discussed  Increase glimepride to 4 mg, call back in 1 week with CBGs. Cautioned about hypoglycemia.   Diarrhea completely resolved off of metformin.   Laroy Apple, MD [facility] 06/14/2017, 7:22 PM

## 2017-06-21 ENCOUNTER — Telehealth: Payer: Self-pay | Admitting: Family Medicine

## 2017-06-21 NOTE — Telephone Encounter (Signed)
Medication change- 11/26 per note. Was told to call in a week and report fasting BS.  11/27-217 11/28-219 11/29-197 11/30-237 12/1-265 12/2-239 12/3-183  Please review.

## 2017-06-21 NOTE — Telephone Encounter (Signed)
lmtcb

## 2017-06-22 MED ORDER — GLIMEPIRIDE 4 MG PO TABS: 6.0000 mg | ORAL_TABLET | Freq: Every day | ORAL | 2 refills | Status: DC

## 2017-06-22 NOTE — Telephone Encounter (Signed)
Pt's wife notified of recommendation Verbalizes understanding

## 2017-06-22 NOTE — Telephone Encounter (Signed)
Lets increase amaryl to 6 mg , I have sent 4 mg tabs.   Due to renal disease I am avoiding metfromin, SGLT-2's. Pt does not want injections, He is on full dose DPP-4 for his renal function.   I would recommend considering insulin or an injectable GLP like trulicity (once weekly injections). I would also consider a referral to endocrinology.   Laroy Apple, MD [facility] 06/22/2017, 7:44 AM

## 2017-07-02 ENCOUNTER — Telehealth: Payer: Self-pay | Admitting: Family Medicine

## 2017-07-02 NOTE — Telephone Encounter (Signed)
Wife called- patients glimepiride was increased to 49m 12/5 . Fasting blood sugar below:  12/6- 250 12/7- 225 12/8- 215 12/9- 204 12/10- 222 12/11- 201 12/12- 219 12/13- 209  Please review and advise.

## 2017-07-02 NOTE — Telephone Encounter (Signed)
lmtcb

## 2017-07-02 NOTE — Telephone Encounter (Signed)
Patient's wife aware.  Followup appointment made for 07/22/17, Dr. Wendi Snipes on vacation in 2 weeks.

## 2017-07-02 NOTE — Telephone Encounter (Signed)
Lets increase to 8 mg glimepride and plan for follow up in 2 weeks. If not controlled we should be considering basal insulin ( once a day).   Laroy Apple, MD [facility] 07/02/2017, 11:50 AM

## 2017-07-22 ENCOUNTER — Ambulatory Visit (INDEPENDENT_AMBULATORY_CARE_PROVIDER_SITE_OTHER): Payer: Medicare Other | Admitting: Family Medicine

## 2017-07-22 ENCOUNTER — Encounter: Payer: Self-pay | Admitting: Family Medicine

## 2017-07-22 VITALS — BP 138/67 | HR 74 | Temp 97.0°F | Ht 66.0 in | Wt 183.0 lb

## 2017-07-22 DIAGNOSIS — N183 Chronic kidney disease, stage 3 unspecified: Secondary | ICD-10-CM

## 2017-07-22 DIAGNOSIS — E1122 Type 2 diabetes mellitus with diabetic chronic kidney disease: Secondary | ICD-10-CM

## 2017-07-22 MED ORDER — INSULIN DEGLUDEC 100 UNIT/ML ~~LOC~~ SOPN: 10.0000 [IU] | PEN_INJECTOR | SUBCUTANEOUS | 1 refills | Status: DC

## 2017-07-22 MED ORDER — PEN NEEDLES 31G X 5 MM MISC: 1.0000 | 3 refills | Status: DC

## 2017-07-22 NOTE — Progress Notes (Signed)
   HPI  Patient presents today here to follow-up for diabetes.  Patient previously was having persistent GI symptoms from metformin.  We discontinued this however he has not had good control with Amaryl plus Januvia. He is tolerating Januvia, however states it is very expensive, over $100.  His average fasting blood sugars between 180 and 220.  She is on 50 mg of Januvia +8 mg of Amaryl. No hypoglycemia. He is willing to start insulin   PMH: Smoking status noted ROS: Per HPI  Objective: BP 138/67   Pulse 74   Temp (!) 97 F (36.1 C) (Oral)   Ht 5' 6"  (1.676 m)   Wt 183 lb (83 kg)   BMI 29.54 kg/m  Gen: NAD, alert, cooperative with exam HEENT: NCAT CV: RRR, good S1/S2, no murmur Resp: CTABL, no wheezes, non-labored Ext: No edema, warm Neuro: Alert and oriented, No gross deficits  2 units of tesiba given in clinic today to teach the patient methodology of injection  Assessment and plan:  #Type 2 diabetes Starting insulin today, Tresiba 10 units once daily, titrating 2 units every 3 days until fasting blood sugar is 150-200. Hopefully we can discontinue Januvia. Patient will call back with blood sugar log in 1 week. His first injection was given in clinic today, however only 2 units given that he had already taken Amaryl.  Greater than 25 minutes was spent with this patient in face-to-face consultation.   Meds ordered this encounter  Medications  . insulin degludec (TRESIBA FLEXTOUCH) 100 UNIT/ML SOPN FlexTouch Pen    Sig: Inject 0.1-0.3 mLs (10-30 Units total) into the skin every morning.    Dispense:  15 mL    Refill:  1  . Insulin Pen Needle (PEN NEEDLES) 31G X 5 MM MISC    Sig: 1 Device by Does not apply route every morning.    Dispense:  32 each    Refill:  [place]ana, MD [facility] 07/22/2017, 5:31 PM

## 2017-07-22 NOTE — Patient Instructions (Signed)
Great to see you!  Come back in 1 month, call in 1 week with your fasting blood sugars.   Start tresiba 10 units once daily.

## 2017-07-27 ENCOUNTER — Telehealth: Payer: Self-pay | Admitting: Family Medicine

## 2017-07-27 MED ORDER — PEN NEEDLES 31G X 5 MM MISC: 1.0000 | 0 refills | Status: DC

## 2017-07-27 NOTE — Telephone Encounter (Signed)
1 month supply  of pen needles sent to [place], MD [facility] 07/27/2017, 4:52 PM

## 2017-07-28 ENCOUNTER — Other Ambulatory Visit: Payer: Self-pay

## 2017-07-28 MED ORDER — PEN NEEDLES 31G X 5 MM MISC: 1.0000 | 0 refills | Status: DC

## 2017-07-28 NOTE — Telephone Encounter (Signed)
Wife aware per dpr.

## 2017-08-02 ENCOUNTER — Telehealth: Payer: Self-pay | Admitting: Family Medicine

## 2017-08-02 NOTE — Telephone Encounter (Signed)
He was previously on a high dose of Amaryl, we have discontinued this and replaced it with insulin.  Continue increasing 2 units every 3 days until his fasting blood sugar is 150-200.  I appreciate the call back and would ask that they call back again in 1 week with another summary.  These blood sugars are expected as he did discontinue Amaryl which was a powerful medication.  Laroy Apple, MD [facility] 08/02/2017, 11:48 AM

## 2017-08-02 NOTE — Telephone Encounter (Signed)
Aware of recommendation

## 2017-08-10 ENCOUNTER — Telehealth: Payer: Self-pay | Admitting: Family Medicine

## 2017-08-10 NOTE — Telephone Encounter (Signed)
Ok to continue titrating. Up to 22 units tomorrow, still increase every 3 days to a max of 30 units before another check in.   I am happy to have her check in in 1 week again.    Fasting goal 150-200  Laroy Apple, MD [facility] 08/10/2017, 12:27 PM

## 2017-08-10 NOTE — Telephone Encounter (Signed)
Pt's wife informed of recommendation Verbalizes understnading

## 2017-08-10 NOTE — Telephone Encounter (Signed)
Wife called with a report of patients fasting blood sugars- taken daily between 8-8:30am.   1/16- 254 1/17- 313 1/18- 275 1/19- 281 1/20- 254 1/21- 314 1/22- 301  Wife states patient has been on 20 units [place] - tomorrow will be the 3rd day. Wanting to know what should be done? Also wanting to know what number do you want it go get to before Wife calls with a report of readings? Please advise.

## 2017-08-16 ENCOUNTER — Telehealth: Payer: Self-pay | Admitting: Family Medicine

## 2017-08-17 NOTE — Telephone Encounter (Signed)
Patient informed that because of his insurance, [facility] Medicare, the savings cards will not work for them.

## 2017-08-23 ENCOUNTER — Encounter: Payer: Self-pay | Admitting: Family Medicine

## 2017-08-23 ENCOUNTER — Ambulatory Visit (INDEPENDENT_AMBULATORY_CARE_PROVIDER_SITE_OTHER): Payer: Medicare Other | Admitting: Family Medicine

## 2017-08-23 VITALS — BP 131/65 | HR 56 | Temp 96.8°F | Ht 66.0 in | Wt 180.4 lb

## 2017-08-23 DIAGNOSIS — N183 Chronic kidney disease, stage 3 unspecified: Secondary | ICD-10-CM

## 2017-08-23 DIAGNOSIS — E1122 Type 2 diabetes mellitus with diabetic chronic kidney disease: Secondary | ICD-10-CM

## 2017-08-23 DIAGNOSIS — L608 Other nail disorders: Secondary | ICD-10-CM

## 2017-08-23 DIAGNOSIS — I10 Essential (primary) hypertension: Secondary | ICD-10-CM | POA: Diagnosis not present

## 2017-08-23 LAB — HEMOGLOBIN A1C: HEMOGLOBIN A1C: 9.9

## 2017-08-23 LAB — BAYER DCA HB A1C WAIVED: HB A1C: 9.9 % — AB (ref <7.0)

## 2017-08-23 MED ORDER — LISINOPRIL 40 MG PO TABS: 40.0000 mg | ORAL_TABLET | Freq: Every day | ORAL | 3 refills | Status: DC

## 2017-08-23 NOTE — Progress Notes (Signed)
   HPI  Patient presents today here to follow-up for chronic medical conditions.  Hypertension Good medication compliance, needs refill of lisinopril. No chest pain or headaches.  Type 2 diabetes Over the last 1 week his blood sugars have ranged 160-190 in the morning, he is using 26 units of [place] but daily. This is however quite expensive. Januvia is also very expensive. No low blood sugars.  Kidney disease-previously discussed, stop metformin, good fluids  PMH: Smoking status noted ROS: Per HPI  Objective: BP 131/65   Pulse (!) 56   Temp (!) 96.8 F (36 C) (Oral)   Ht 5' 6"  (1.676 m)   Wt 180 lb 6.4 oz (81.8 kg)   BMI 29.12 kg/m  Gen: NAD, alert, cooperative with exam HEENT: NCAT CV: RRR, good S1/S2, no murmur Resp: CTABL, no wheezes, non-labored Ext: No edema, warm Neuro: Alert and oriented, No gross deficits  Diabetic Foot Exam - Simple   Simple Foot Form Diabetic Foot exam was performed with the following findings:  Yes 08/23/2017  8:37 AM  Visual Inspection No deformities, no ulcerations, no other skin breakdown bilaterally:  Yes Sensation Testing Intact to touch and monofilament testing bilaterally:  Yes Pulse Check Posterior Tibialis and Dorsalis pulse intact bilaterally:  Yes Comments Thickened yellow toenails bilaterally      Assessment and plan:  #Type 2 diabetes Control improving, patiently previously on high dose of metformin for his renal function. Continue tresiba + Januvia, offered changing to Reli-on NPH or sending to endo  #Toenail deformity Patient with long thickened yellow toenails, on the left second toe is actually curling and slightly Podiatry  #CKD 3 Recheck labs  #Hypertension No change, controlled     Orders Placed This Encounter  Procedures  . CMP14+EGFR  . CBC with Differential/Platelet  . Lipid panel  . Bayer DCA Hb A1c Waived  . Ambulatory referral to Podiatry    Referral Priority:   Routine    Referral Type:    Consultation    Referral Reason:   Specialty Services Required    Requested Specialty:   Podiatry    Number of Visits Requested:   1    Meds ordered this encounter  Medications  . lisinopril (PRINIVIL,ZESTRIL) 40 MG tablet    Sig: Take 1 tablet (40 mg total) by mouth daily.    Dispense:  90 tablet    Refill:  [place], MD [facility] 08/23/2017, 9:03 AM

## 2017-08-23 NOTE — Patient Instructions (Signed)
Great to see you!  You will be called with an appointment for podiatry.

## 2017-08-24 LAB — CMP14+EGFR
A/G RATIO: 1.8 (ref 1.2–2.2)
ALT: 24 IU/L (ref 0–44)
AST: 16 IU/L (ref 0–40)
Albumin: 4.2 g/dL (ref 3.5–4.8)
Alkaline Phosphatase: 78 IU/L (ref 39–117)
BILIRUBIN TOTAL: 0.5 mg/dL (ref 0.0–1.2)
BUN/Creatinine Ratio: 13 (ref 10–24)
BUN: 25 mg/dL (ref 8–27)
CALCIUM: 9.6 mg/dL (ref 8.6–10.2)
CHLORIDE: 104 mmol/L (ref 96–106)
CO2: 24 mmol/L (ref 20–29)
Creatinine, Ser: 1.95 mg/dL — ABNORMAL HIGH (ref 0.76–1.27)
GFR calc Af Amer: 38 mL/min/{1.73_m2} — ABNORMAL LOW (ref >59)
GFR, EST NON AFRICAN AMERICAN: 33 mL/min/{1.73_m2} — AB (ref >59)
GLUCOSE: 173 mg/dL — AB (ref 65–99)
Globulin, Total: 2.3 g/dL (ref 1.5–4.5)
POTASSIUM: 4.8 mmol/L (ref 3.5–5.2)
Sodium: 144 mmol/L (ref 134–144)
TOTAL PROTEIN: 6.5 g/dL (ref 6.0–8.5)

## 2017-08-24 LAB — CBC WITH DIFFERENTIAL/PLATELET
BASOS ABS: 0 10*3/uL (ref 0.0–0.2)
Basos: 0 %
EOS (ABSOLUTE): 0.3 10*3/uL (ref 0.0–0.4)
Eos: 3 %
Hematocrit: 39 % (ref 37.5–51.0)
Hemoglobin: 12.8 g/dL — ABNORMAL LOW (ref 13.0–17.7)
IMMATURE GRANS (ABS): 0 10*3/uL (ref 0.0–0.1)
IMMATURE GRANULOCYTES: 0 %
LYMPHS: 21 %
Lymphocytes Absolute: 2.2 10*3/uL (ref 0.7–3.1)
MCH: 26.8 pg (ref 26.6–33.0)
MCHC: 32.8 g/dL (ref 31.5–35.7)
MCV: 82 fL (ref 79–97)
MONOCYTES: 13 %
Monocytes Absolute: 1.3 10*3/uL — ABNORMAL HIGH (ref 0.1–0.9)
NEUTROS PCT: 63 %
Neutrophils Absolute: 6.3 10*3/uL (ref 1.4–7.0)
PLATELETS: 186 10*3/uL (ref 150–379)
RBC: 4.78 x10E6/uL (ref 4.14–5.80)
RDW: 13.6 % (ref 12.3–15.4)
WBC: 10.2 10*3/uL (ref 3.4–10.8)

## 2017-08-24 LAB — LIPID PANEL
CHOLESTEROL TOTAL: 100 mg/dL (ref 100–199)
Chol/HDL Ratio: 3.1 ratio (ref 0.0–5.0)
HDL: 32 mg/dL — AB (ref >39)
LDL Calculated: 49 mg/dL (ref 0–99)
TRIGLYCERIDES: 96 mg/dL (ref 0–149)
VLDL CHOLESTEROL CAL: 19 mg/dL (ref 5–40)

## 2017-09-09 ENCOUNTER — Other Ambulatory Visit: Payer: Self-pay | Admitting: Family Medicine

## 2017-09-14 ENCOUNTER — Telehealth: Payer: Self-pay | Admitting: *Deleted

## 2017-09-14 NOTE — Telephone Encounter (Signed)
Amaryl was discontinued when we started insulin.   No need for refill.   Laroy Apple, MD [facility] 09/14/2017, 3:15 PM

## 2017-09-16 DIAGNOSIS — M79676 Pain in unspecified toe(s): Secondary | ICD-10-CM | POA: Diagnosis not present

## 2017-09-16 DIAGNOSIS — L84 Corns and callosities: Secondary | ICD-10-CM | POA: Diagnosis not present

## 2017-09-16 DIAGNOSIS — B351 Tinea unguium: Secondary | ICD-10-CM | POA: Diagnosis not present

## 2017-09-16 DIAGNOSIS — E1151 Type 2 diabetes mellitus with diabetic peripheral angiopathy without gangrene: Secondary | ICD-10-CM | POA: Diagnosis not present

## 2017-09-17 ENCOUNTER — Other Ambulatory Visit: Payer: Self-pay | Admitting: Family Medicine

## 2017-10-01 ENCOUNTER — Other Ambulatory Visit: Payer: Self-pay | Admitting: *Deleted

## 2017-10-01 MED ORDER — ONETOUCH ULTRA BLUE VI STRP: ORAL_STRIP | 5 refills | Status: DC

## 2017-10-01 MED ORDER — ONETOUCH DELICA LANCETS 33G MISC: 5 refills | Status: DC

## 2017-10-01 MED ORDER — ATORVASTATIN CALCIUM 40 MG PO TABS: 40.0000 mg | ORAL_TABLET | Freq: Every day | ORAL | 2 refills | Status: DC

## 2017-10-25 DIAGNOSIS — H26492 Other secondary cataract, left eye: Secondary | ICD-10-CM | POA: Diagnosis not present

## 2017-10-25 DIAGNOSIS — E119 Type 2 diabetes mellitus without complications: Secondary | ICD-10-CM | POA: Diagnosis not present

## 2017-10-25 DIAGNOSIS — H43822 Vitreomacular adhesion, left eye: Secondary | ICD-10-CM | POA: Diagnosis not present

## 2017-10-25 DIAGNOSIS — H34232 Retinal artery branch occlusion, left eye: Secondary | ICD-10-CM | POA: Diagnosis not present

## 2017-11-02 NOTE — Progress Notes (Signed)
Established Carotid Patient   History of Present Illness   Evan Stanley is a 77 y.o. ([birth year]) male who presents with chief complaint: no complaints.  Prior procedures include: 1. R CEA w/ BPA for sx R ICA stenosis >90% (01/13/16) 2.  R carotid angiogram (70/96/28) complicated by CVA   Previous carotid studies demonstrated: RICA 40-59% stenosis (felt due to compensatory flow), LICA occluded.  Patient has history of stroke sx: L eye visual field loss, <1 hour episode of slurred speech.  He also had a CVA related to a uneventful R carotid angiogram: discoordination of LLE.  The patient's previous neurologic deficits have resolved.  The patient's PMH, PSH, SH, and FamHx were reviewed on 11/03/17 are unchanged from 10/30/16.  Current Outpatient Medications  Medication Sig Dispense Refill  . amLODipine (NORVASC) 10 MG tablet Take 1 tablet (10 mg total) by mouth daily. 90 tablet 3  . aspirin EC 81 MG tablet Take 1 tablet (81 mg total) by mouth daily. 90 tablet 3  . atorvastatin (LIPITOR) 40 MG tablet Take 1 tablet (40 mg total) by mouth daily. 90 tablet 2  . atropine 1 % ophthalmic solution Place 1 drop into the left eye daily.   6  . glimepiride (AMARYL) 4 MG tablet TAKE 1 AND 1/2 TABLETS (6 MG TOTAL) BY MOUTH DAILY BEFORE BREAKFAST. 45 tablet 2  . hydrochlorothiazide (HYDRODIURIL) 25 MG tablet Take 1 tablet (25 mg total) by mouth daily. 90 tablet 3  . Insulin Pen Needle (PEN NEEDLES) 31G X 5 MM MISC 1 Device by Does not apply route every morning. Use to check BS twice daily and prn. E11.22 30 each 0  . latanoprost (XALATAN) 0.005 % ophthalmic solution Place 1 drop into the left eye at bedtime.     Evan Stanley lisinopril (PRINIVIL,ZESTRIL) 40 MG tablet Take 1 tablet (40 mg total) by mouth daily. 90 tablet 3  . metoprolol succinate (TOPROL-XL) 100 MG 24 hr tablet TAKE 1 TABLET BY MOUTH  DAILY WITH OR IMMEDIATELY  FOLLOWING A MEAL 90 tablet 3  . ONE TOUCH ULTRA TEST test strip Use to check BG twice  daily 100 each 5  . ONETOUCH DELICA LANCETS 36O MISC Use to check BG twice daily 100 each 5  . Pyridoxine HCl (VITAMIN B6 PO) Take by mouth daily.    . sitaGLIPtin (JANUVIA) 50 MG tablet Take 1 tablet (50 mg total) daily by mouth. 90 tablet 3  . TRESIBA FLEXTOUCH 100 UNIT/ML SOPN FlexTouch Pen INJECT SUBCUTANEOUSLY 10 TO 30 UNITS EVERY MORNING. 30 mL 2  . vitamin C (ASCORBIC ACID) 500 MG tablet Take 500 mg by mouth daily.    . vitamin E (VITAMIN E) 1000 UNIT capsule Take 1,000 Units by mouth daily.     No current facility-administered medications for this visit.     On ROS today: no new CVA or TIA sx, no intermittent claudication    Physical Examination   Vitals:   11/03/17 1301 11/03/17 1303  BP: 128/74 123/72  Pulse: 74   Resp: 16   Temp: (!) 97 F (36.1 C)   TempSrc: Oral   SpO2: 100%   Weight: 182 lb (82.6 kg)   Height: 5' 6"  (1.676 m)    Body mass index is 29.38 kg/m.  General Alert, O x 3, WD, NAD  Neck Supple, mid-line trachea,  inc well healed  Pulmonary Sym exp, good B air movt, CTA B  Cardiac RRR, Nl S1, S2, no Murmurs, No rubs, No S3,S4  Vascular Vessel Right Left  Radial Palpable Palpable  Brachial Palpable Palpable  Carotid Palpable, No Bruit Palpable, No Bruit  Aorta Not palpable N/A  Femoral Palpable Palpable  Popliteal Not palpable Not palpable  PT Not palpable Not palpable  DP Faintly palpable Faintly palpable    Gastro- intestinal soft, non-distended, non-tender to palpation, No guarding or rebound, no HSM, no masses, no CVAT B, No palpable prominent aortic pulse,    Musculo- skeletal M/S 5/5 throughout  , Extremities without ischemic changes    Neurologic Cranial nerves 2-12 intact , Pain and light touch intact in extremities , Motor exam as listed above    Non-Invasive Vascular Imaging   B Carotid Duplex (11/03/2017):   R ICA stenosis:  1-39%  R VA: patent and antegrade  L ICA stenosis:  Occluded  L VA: patent and antegrade   Medical  Decision Making   Evan Stanley is a 77 y.o. male who presents with: R CEA for sx ICA stenosis >80%, occluded L ICA, s/p R carotid angiogram complicated by CVA, no residual neurologic sx.   Based on the patient's vascular studies and examination, I have offered the patient:  Annual B carotid duplex.  I discussed in depth with the patient the nature of atherosclerosis, and emphasized the importance of maximal medical management including strict control of blood pressure, blood glucose, and lipid levels, antiplatelet agents, obtaining regular exercise, and cessation of smoking.    The patient is aware that without maximal medical management the underlying atherosclerotic disease process will progress, limiting the benefit of any interventions.  The patient is currently on a statin: Pravachol.   The patient is currently on an anti-platelet: ASA.  Thank you for allowing [place] to participate in this patient's care.   [name], MD, FACS [facility] Office: [phone] Pager: [phone]

## 2017-11-03 ENCOUNTER — Encounter: Payer: Self-pay | Admitting: Vascular Surgery

## 2017-11-03 ENCOUNTER — Ambulatory Visit: Payer: Medicare Other | Admitting: Vascular Surgery

## 2017-11-03 ENCOUNTER — Telehealth: Payer: Self-pay

## 2017-11-03 ENCOUNTER — Ambulatory Visit (HOSPITAL_COMMUNITY)
Admission: RE | Admit: 2017-11-03 | Discharge: 2017-11-03 | Disposition: A | Discharge status: Home / Self Care | Payer: Medicare Other | Source: Ambulatory Visit | Attending: Vascular Surgery | Admitting: Vascular Surgery

## 2017-11-03 VITALS — BP 123/72 | HR 74 | Temp 97.0°F | Resp 16 | Ht 66.0 in | Wt 182.0 lb

## 2017-11-03 DIAGNOSIS — I779 Disorder of arteries and arterioles, unspecified: Secondary | ICD-10-CM | POA: Diagnosis not present

## 2017-11-03 DIAGNOSIS — I6522 Occlusion and stenosis of left carotid artery: Secondary | ICD-10-CM | POA: Diagnosis not present

## 2017-11-03 DIAGNOSIS — I739 Peripheral vascular disease, unspecified: Secondary | ICD-10-CM

## 2017-11-03 DIAGNOSIS — I6521 Occlusion and stenosis of right carotid artery: Secondary | ICD-10-CM

## 2017-11-03 DIAGNOSIS — I6523 Occlusion and stenosis of bilateral carotid arteries: Secondary | ICD-10-CM | POA: Diagnosis not present

## 2017-11-03 DIAGNOSIS — I6529 Occlusion and stenosis of unspecified carotid artery: Secondary | ICD-10-CM | POA: Insufficient documentation

## 2017-11-03 NOTE — Telephone Encounter (Signed)
Pt with still elevated CBGs on 30 units tresib daily.   Will continue to titrate  Laroy Apple, MD [facility] 11/03/2017, 4:11 PM

## 2017-11-03 NOTE — Telephone Encounter (Signed)
Wife aware and verbalizes understanding.  

## 2017-11-03 NOTE — Telephone Encounter (Signed)
Wife is conserened about patients fasting BS. Patient is on 30 units of Tresiba and wanting to know if she needs to increase?  4/10-278 4/11-214 4/12-213 4/13-268 4/14-278 4/15-255 4/16-260 4/17-316  Please advise.

## 2017-11-05 ENCOUNTER — Other Ambulatory Visit: Payer: Self-pay | Admitting: Family Medicine

## 2017-11-05 ENCOUNTER — Ambulatory Visit: Payer: Medicare Other | Admitting: Vascular Surgery

## 2017-11-05 ENCOUNTER — Encounter (HOSPITAL_COMMUNITY): Payer: Medicare Other

## 2017-11-10 ENCOUNTER — Encounter: Payer: Self-pay | Admitting: *Deleted

## 2017-11-10 ENCOUNTER — Ambulatory Visit (INDEPENDENT_AMBULATORY_CARE_PROVIDER_SITE_OTHER): Payer: Medicare Other | Admitting: *Deleted

## 2017-11-10 VITALS — BP 116/59 | HR 60 | Ht 64.5 in | Wt 183.0 lb

## 2017-11-10 DIAGNOSIS — Z0000 Encounter for general adult medical examination without abnormal findings: Secondary | ICD-10-CM

## 2017-11-10 NOTE — Progress Notes (Addendum)
Subjective:   Evan Stanley is a 77 y.o. male who presents for an Initial Medicare Annual Wellness Visit. Evan Stanley lives at home with his wife. He has 2 sons, 1 stepson, and 7 grandchildren. He enjoys playing golf and has a group of friends that he plays golf with in [place]. He also meets them for breakfast on Fridays. He is active around his home and yard.   Review of Systems  Health is about the same as last year.   Cardiac Risk Factors include: advanced age (>56mn, >>63women);diabetes mellitus;dyslipidemia;male gender;sedentary lifestyle;hypertension;obesity (BMI >30kg/m2);microalbuminuria    Objective:    Today's Vitals   11/10/17 0842 11/10/17 0848  BP: (!) 116/59   Pulse: 60   Weight: 183 lb (83 kg)   Height: 5' 4.5" (1.638 m)   PainSc:  3    Body mass index is 30.93 kg/m.  Advanced Directives 11/10/2017 11/04/2016 04/28/2016 01/13/2016 01/10/2016 01/03/2016 11/22/2015  Does Patient Have a Medical Advance Directive? No No No No No No No  Would patient like information on creating a medical advance directive? No - Patient declined No - Patient declined No - patient declined information No - patient declined information No - patient declined information - No - patient declined information    Current Medications (verified) Outpatient Encounter Medications as of 11/10/2017  Medication Sig  . amLODipine (NORVASC) 10 MG tablet Take 1 tablet (10 mg total) by mouth daily.  .Marland Kitchenammonium lactate (AMLACTIN) 12 % cream APPLY TO DRY SKIN ON BOTTOM OF FEET 2 TIMES A DAY  . aspirin EC 81 MG tablet Take 1 tablet (81 mg total) by mouth daily.  .Marland Kitchenatorvastatin (LIPITOR) 40 MG tablet Take 1 tablet (40 mg total) by mouth daily.  .Marland Kitchenatropine 1 % ophthalmic solution Place 1 drop into the left eye daily.   .Marland KitchenEASY TOUCH PEN NEEDLES 31G X 5 MM MISC USE EVERY MORNING  . hydrochlorothiazide (HYDRODIURIL) 25 MG tablet Take 1 tablet (25 mg total) by mouth daily.  .Marland Kitchenlatanoprost (XALATAN) 0.005 %  ophthalmic solution Place 1 drop into the left eye at bedtime.   .Marland Kitchenlisinopril (PRINIVIL,ZESTRIL) 40 MG tablet Take 1 tablet (40 mg total) by mouth daily.  . metoprolol succinate (TOPROL-XL) 100 MG 24 hr tablet TAKE 1 TABLET BY MOUTH  DAILY WITH OR IMMEDIATELY  FOLLOWING A MEAL  . ONE TOUCH ULTRA TEST test strip Use to check BG twice daily  . ONETOUCH DELICA LANCETS 372ZMISC Use to check BG twice daily  . Pyridoxine HCl (VITAMIN B6 PO) Take by mouth daily.  . sitaGLIPtin (JANUVIA) 50 MG tablet Take 1 tablet (50 mg total) daily by mouth.  . TRESIBA FLEXTOUCH 100 UNIT/ML SOPN FlexTouch Pen INJECT SUBCUTANEOUSLY 10 TO 30 UNITS EVERY MORNING.  . vitamin C (ASCORBIC ACID) 500 MG tablet Take 500 mg by mouth daily.  . vitamin E (VITAMIN E) 1000 UNIT capsule Take 1,000 Units by mouth daily.  .Marland Kitchenglimepiride (AMARYL) 4 MG tablet TAKE 1 AND 1/2 TABLETS (6 MG TOTAL) BY MOUTH DAILY BEFORE BREAKFAST. (Patient not taking: Reported on 11/03/2017)   No facility-administered encounter medications on file as of 11/10/2017.     Allergies (verified) Patient has no known allergies.   History: Past Medical History:  Diagnosis Date  . AKI (acute kidney injury) ([place]   . Allergy   . Anxiety   . Arthritis    Right shoulder  . Atrial fibrillation ([place]   . Carotid artery stenosis   .  Cataract   . CHF (congestive heart failure) ([place])   . CVA (cerebral vascular accident) ([place]) 04/28/2016  . Decreased vision    left eye  . Diabetes mellitus without complication ([place])    Takes Metformin  . Hyperlipidemia   . Hypertension   . Salmonella bacteremia 04/29/2016  . Sepsis ([place]) 04/29/2016  . Stroke ([place]) 11/18/2015   no deficits  . TIA (transient ischemic attack)    affected left eye  . Urgency of urination    Past Surgical History:  Procedure Laterality Date  . AORTIC VALVE REPLACEMENT    . BACK SURGERY  80s  . CARDIAC VALVE REPLACEMENT  2010  . ENDARTERECTOMY Right 01/13/2016   Procedure: ENDARTERECTOMY  CAROTID - RIGHT;  Surgeon: Conrad Maverick, MD;  Location: [facility];  Service: Vascular;  Laterality: Right;  . EYE SURGERY    . laser surgery, left eye  Left 10-29-2015    retinal surgery/ Deloria Lair MD   . PERIPHERAL VASCULAR CATHETERIZATION Right 11/18/2015   Procedure: Carotid Angiography;  Surgeon: Conrad Gibson, MD;  Location: [facility];  Service: Cardiovascular;  Laterality: Right;  . PERIPHERAL VASCULAR CATHETERIZATION N/A 11/18/2015   Procedure: Aortic Arch Angiography;  Surgeon: Conrad [place], MD;  Location: [facility];  Service: Cardiovascular;  Laterality: N/A;  . TEE WITHOUT CARDIOVERSION N/A 05/05/2016   Procedure: TRANSESOPHAGEAL ECHOCARDIOGRAM (TEE);  Surgeon: Lelon Perla, MD;  Location[facility];  Service: Cardiovascular;  Laterality: N/A;   Family History  Problem Relation Age of Onset  . Heart disease Mother   . Cancer Mother        breast  . Stroke Father 26  . Hypertension Father   . Early death Sister        infant death  . Heart disease Brother   . Hydrocephalus Brother   . Heart disease Brother   . Diabetes Brother   . Cancer Brother        prostat  . Healthy Son   . Healthy Son    Social History   Socioeconomic History  . Marital status: Married    Spouse name: Not on file  . Number of children: 2  . Years of education: 43  . Highest education level: 11th grade  Occupational History  . Occupation: maintenance    Employer: [employer]    Comment: Retired  Scientific laboratory technician  . Financial resource strain: Not hard at all  . Food insecurity:    Worry: Never true    Inability: Never true  . Transportation needs:    Medical: No    Non-medical: No  Tobacco Use  . Smoking status: Former Smoker    Packs/day: 2.00    Years: 20.00    Pack years: 40.00    Types: Cigarettes    Last attempt to quit: 07/20/1986    Years since quitting: 31.3  . Smokeless tobacco: Never Used  Substance and Sexual Activity  . Alcohol use: No    Alcohol/week: 0.0 oz    . Drug use: No  . Sexual activity: Yes  Lifestyle  . Physical activity:    Days per week: 3 days    Minutes per session: 30 min  . Stress: Only a little  Relationships  . Social connections:    Talks on phone: More than three times a week    Gets together: More than three times a week    Attends religious service: Never    Active member of  club or organization: Yes    Attends meetings of clubs or organizations: More than 4 times per year    Relationship status: Married  Other Topics Concern  . Not on file  Social History Narrative  . Not on file   Tobacco Counseling No tobacco use  Clinical Intake:     Pain : 0-10 Pain Score: 3      Nutritional Status: BMI > 30  Obese  How often do you need to have someone help you when you read instructions, pamphlets, or other written materials from your doctor or pharmacy?: 1 - Never What is the last grade level you completed in school?: 11     Information entered by :: [employer]  Activities of Daily Living In your present state of health, do you have any difficulty performing the following activities: 11/10/2017  Hearing? 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 Y  Comment Blind in left eye due to stroke  Difficulty concentrating or making decisions? N  Walking or climbing stairs? N  Dressing or bathing? N  Doing errands, shopping? N  Preparing Food and eating ? N  Using the Toilet? N  In the past six months, have you accidently leaked urine? N  Do you have problems with loss of bowel control? N  Managing your Medications? N  Comment has a routine  Managing your Finances? N  Housekeeping or managing your Housekeeping? N  Some recent data might be hidden     Immunizations and Health Maintenance Immunization History  Administered Date(s) Administered  . Influenza Whole 05/26/2010  . Influenza, High Dose Seasonal PF 05/20/2017  . Influenza,inj,Quad PF,6+ Mos 06/14/2013,  04/16/2014, 06/18/2015  . Influenza-Unspecified 05/14/2016  . Pneumococcal Conjugate-13 06/18/2015  . Pneumococcal Polysaccharide-23 12/16/2016  . Tdap 03/05/2011   There are no preventive care reminders to display for this patient.  Patient Care Team: Timmothy Euler, MD as PCP - General (Family Medicine) Belva Crome, MD as Consulting Physician (Cardiology) Conrad [place], MD as Consulting Physician (Vascular Surgery) Hurman Horn, MD as Consulting Physician (Ophthalmology) Clent Jacks, MD as Consulting Physician (Ophthalmology)  No hospitalizations, ER visits, or surgeries this past year.      Assessment:   This is a routine wellness examination for [place].  Hearing/Vision screen No deficits noted during visit.   Dietary issues and exercise activities discussed:  Diet Breakfast-cereal, lunch-snack like a pack of nabs, Supper-Chicken pot pie for supper last night Quit eating bread and sweets Drinks diet Coke and water throughout the day  Current Exercise Habits: Home exercise routine, Type of exercise: walking, Time (Minutes): 30, Frequency (Times/Week): 3, Weekly Exercise (Minutes/Week): 90, Intensity: Mild, Exercise limited by: cardiac condition(s)  Goals    . Exercise 150 min/wk Moderate Activity      Depression Screen PHQ 2/9 Scores 11/10/2017 08/23/2017 07/22/2017 05/20/2017  PHQ - 2 Score 0 0 0 0    Fall Risk Fall Risk  11/10/2017 08/23/2017 07/22/2017 05/20/2017 02/16/2017  Falls in the past year? No No No Yes No  Number falls in past yr: - - - 1 -  Injury with Fall? - - - No -    Is the patient's home free of loose throw rugs in walkways, pet beds, electrical cords, etc?   yes      Grab bars in the bathroom? no      Handrails on the stairs?   yes      Adequate  lighting?   yes   Cognitive Function: MMSE - Mini Mental State Exam 11/10/2017 11/04/2016  Orientation to time 5 4  Orientation to Place 5 5  Registration 3 3  Attention/ Calculation 0 1    Attention/Calculation-comments not attempted -  Recall 2 3  Language- name 2 objects 2 2  Language- repeat 1 1  Language- follow 3 step command 3 3  Language- read & follow direction 1 1  Write a sentence 1 1  Copy design 1 1  Total score 24 25  Patient did not attempt to spell "world" backwards but other areas were WNL      Screening Tests Health Maintenance  Topic Date Due  . OPHTHALMOLOGY EXAM  02/05/2018  . INFLUENZA VACCINE  02/17/2018  . HEMOGLOBIN A1C  02/20/2018  . FOOT EXAM  08/23/2018  . TETANUS/TDAP  03/04/2021  . COLONOSCOPY  08/13/2026  . PNA vac Low Risk Adult  Completed    Cancer Screenings: Lung: Low Dose CT Chest recommended if Age 42-80 years, 30 pack-year currently smoking OR have quit w/in 15years. Patient does not qualify. Colorectal: UTD-08/13/16     Plan:  Placed CGM. Return in two weeks to download results.  Keep f/u with PCP Continue to stay active. Aim for at least 150 minutes a week of moderate activity   I have personally reviewed and noted the following in the patient's chart:   . Medical and social history . Use of alcohol, tobacco or illicit drugs  . Current medications and supplements . Functional ability and status . Nutritional status . Physical activity . Advanced directives . List of other physicians . Hospitalizations, surgeries, and ER visits in previous 12 months . Vitals . Screenings to include cognitive, depression, and falls . Referrals and appointments  In addition, I have reviewed and discussed with patient certain preventive protocols, quality metrics, and best practice recommendations. A written personalized care plan for preventive services as well as general preventive health recommendations were provided to patient.     Chong Sicilian, RN   11/23/2017     I have reviewed and agree with the above AWV documentation.   Laroy Apple, MD [facility] 11/25/2017, 11:43 AM

## 2017-11-10 NOTE — Patient Instructions (Signed)
  Evan Stanley , Thank you for taking time to come for your Medicare Wellness Visit. I appreciate your ongoing commitment to your health goals. Please review the following plan we discussed and let me know if I can assist you in the future.   These are the goals we discussed: Goals    . Exercise 150 min/wk Moderate Activity       This is a list of the screening recommended for you and due dates:  Health Maintenance  Topic Date Due  . Eye exam for diabetics  02/05/2018  . Flu Shot  02/17/2018  . Hemoglobin A1C  02/20/2018  . Complete foot exam   08/23/2018  . Tetanus Vaccine  03/04/2021  . Colon Cancer Screening  08/13/2026  . Pneumonia vaccines  Completed   Return in two weeks, on May 8th, to have the sensor downloaded. If you have any problems please let me know.

## 2017-11-24 DIAGNOSIS — H26492 Other secondary cataract, left eye: Secondary | ICD-10-CM | POA: Diagnosis not present

## 2017-11-25 ENCOUNTER — Encounter: Payer: Self-pay | Admitting: Family Medicine

## 2017-11-25 ENCOUNTER — Ambulatory Visit (INDEPENDENT_AMBULATORY_CARE_PROVIDER_SITE_OTHER): Payer: Medicare Other | Admitting: Family Medicine

## 2017-11-25 VITALS — BP 120/63 | HR 70 | Temp 97.0°F | Ht 64.5 in | Wt 183.0 lb

## 2017-11-25 DIAGNOSIS — N183 Chronic kidney disease, stage 3 unspecified: Secondary | ICD-10-CM

## 2017-11-25 DIAGNOSIS — E1122 Type 2 diabetes mellitus with diabetic chronic kidney disease: Secondary | ICD-10-CM

## 2017-11-25 NOTE — Progress Notes (Signed)
   HPI  Patient presents today for follow-up type 2 diabetes.  Patient had uncontrolled A1c in February at 9.9, we started him on insulin prior to that.  He is continued titrating and is now taking 40 units of basal insulin daily.  No hypoglycemia Average fasting blood sugar is 240-270.  He is willing to see endocrinology  PMH: Smoking status noted ROS: Per HPI  Objective: BP 120/63   Pulse 70   Temp (!) 97 F (36.1 C) (Oral)   Ht 5' 4.5" (1.638 m)   Wt 183 lb (83 kg)   BMI 30.93 kg/m  Gen: NAD, alert, cooperative with exam HEENT: NCAT CV: RRR, good S1/S2, no murmur Resp: CTABL, no wheezes, non-labored Ext: No edema, warm Neuro: Alert and oriented, No gross deficits  Assessment and plan:  #Type 2 diabetes Uncontrolled likely, A1c pending I have discussed with him and we have decided to go ahead and pursue endocrinology. Referral placed. Continue titrating basal insulin, new max dose 60 units once daily Continue Januvia, renally dose  He was previously well controlled, however we discontinued metformin due to side effects, it was replaced with renally dosed januvia.  After starting insulin we stopped glimepride   Orders Placed This Encounter  Procedures  . Microalbumin / creatinine urine ratio  . Bayer DCA Hb A1c Waived  . Ambulatory referral to Endocrinology    Referral Priority:   Routine    Referral Type:   Consultation    Referral Reason:   Specialty Services Required    Number of Visits Requested:   [place], MD [facility] 11/25/2017, 8:28 AM   \

## 2017-11-25 NOTE — Patient Instructions (Addendum)
Great to see you!  Come back to see Dr. [name] in 3 months

## 2017-11-26 LAB — MICROALBUMIN / CREATININE URINE RATIO
Creatinine, Urine: 100.6 mg/dL
MICROALBUM., U, RANDOM: 47.5 ug/mL
Microalb/Creat Ratio: 47.2 mg/g creat — ABNORMAL HIGH (ref 0.0–30.0)

## 2017-11-29 ENCOUNTER — Other Ambulatory Visit: Payer: Self-pay | Admitting: *Deleted

## 2017-11-29 MED ORDER — ONETOUCH ULTRA BLUE VI STRP: ORAL_STRIP | 3 refills | Status: DC

## 2017-12-09 DIAGNOSIS — H211X2 Other vascular disorders of iris and ciliary body, left eye: Secondary | ICD-10-CM | POA: Diagnosis not present

## 2017-12-09 DIAGNOSIS — H3522 Other non-diabetic proliferative retinopathy, left eye: Secondary | ICD-10-CM | POA: Diagnosis not present

## 2017-12-09 DIAGNOSIS — H34232 Retinal artery branch occlusion, left eye: Secondary | ICD-10-CM | POA: Diagnosis not present

## 2017-12-09 DIAGNOSIS — H4052X3 Glaucoma secondary to other eye disorders, left eye, severe stage: Secondary | ICD-10-CM | POA: Diagnosis not present

## 2017-12-13 ENCOUNTER — Other Ambulatory Visit: Payer: Self-pay | Admitting: Family Medicine

## 2017-12-15 ENCOUNTER — Telehealth: Payer: Self-pay | Admitting: Family Medicine

## 2017-12-15 NOTE — Telephone Encounter (Signed)
Please pass to Dr. Wendi Snipes. At this time the sugars over 200 are not dangerous and can be worked on with his provider.

## 2017-12-15 NOTE — Telephone Encounter (Signed)
Please review and advise.

## 2017-12-15 NOTE — Telephone Encounter (Signed)
Pharmacy will have to address auto refills, not [place]. Reply to second part of request.

## 2017-12-16 ENCOUNTER — Other Ambulatory Visit: Payer: Self-pay | Admitting: Family Medicine

## 2017-12-16 NOTE — Telephone Encounter (Signed)
Pt had multiple calls open regarding this, please see previous call. Will close encounter.

## 2017-12-16 NOTE — Progress Notes (Signed)
Pt advised of MD feedback and states he is going to go see the specialist June 27th and will discuss further with them.

## 2017-12-16 NOTE — Telephone Encounter (Signed)
I agree these numbers are not ideal but arent dangerous acutely.   Continue titrating tresiba and I would recommend changing from [place] to a GLP weekly injection like trulicity.   If he would lik ethis I will send it and he can make a triage nurse appt to have the first injection.   Laroy Apple, MD [facility] 12/16/2017, 9:43 AM

## 2017-12-16 NOTE — Progress Notes (Signed)
Pt is currently titrating Insulin. I would recommend considering changing from [place] to Trulicity or similar GLP.   He will need to call the pharmacy to stop the automatic refills.   Laroy Apple, MD [facility] 12/16/2017, 7:41 AM

## 2017-12-23 DIAGNOSIS — L84 Corns and callosities: Secondary | ICD-10-CM | POA: Diagnosis not present

## 2017-12-23 DIAGNOSIS — M79676 Pain in unspecified toe(s): Secondary | ICD-10-CM | POA: Diagnosis not present

## 2017-12-23 DIAGNOSIS — E1151 Type 2 diabetes mellitus with diabetic peripheral angiopathy without gangrene: Secondary | ICD-10-CM | POA: Diagnosis not present

## 2017-12-23 DIAGNOSIS — B351 Tinea unguium: Secondary | ICD-10-CM | POA: Diagnosis not present

## 2017-12-28 ENCOUNTER — Other Ambulatory Visit: Payer: Self-pay | Admitting: Family Medicine

## 2017-12-28 MED ORDER — INSULIN DEGLUDEC 100 UNIT/ML ~~LOC~~ SOPN: PEN_INJECTOR | SUBCUTANEOUS | 2 refills | Status: DC

## 2017-12-28 NOTE — Telephone Encounter (Signed)
Refill sent to pharmacy.   

## 2018-01-12 ENCOUNTER — Other Ambulatory Visit: Payer: Self-pay | Admitting: Family Medicine

## 2018-01-12 DIAGNOSIS — I10 Essential (primary) hypertension: Secondary | ICD-10-CM

## 2018-02-03 ENCOUNTER — Other Ambulatory Visit: Payer: Self-pay | Admitting: Family Medicine

## 2018-02-03 NOTE — Progress Notes (Addendum)
Patient ID: Evan Stanley, male   DOB: [birth year], 77 y.o.   MRN: [id number]          Reason for Appointment: Consultation for Type 2 Diabetes  Referring physician: Kenn File   History of Present Illness:          Date of diagnosis of type 2 diabetes mellitus: 2010       Background history:   He thinks he was diagnosed to have diabetes after his coronary bypass surgery and blood sugars are not very high He had been on metformin until about 05/2017 Also at some point had taken glimepiride Usually appears to have had A1c just above 7%  Recent history:   INSULIN regimen is:   [place] U 100, 60 units daily     Non-insulin hypoglycemic drugs the patient is taking are: None  Current management, blood sugar patterns and problems identified:  He appears to have had worsening of his diabetes after stopping metformin last November when he was switched to [facility]  With increasing blood sugars he was started on insulin in 07/2017 with [place] and the dose progressively increased  However even with current dose of 60 units of Tresiba his blood sugars are still very high and averaging 255 fasting  He has symptoms of increased urination and some thirst but no recent weight loss, blurred vision or unusual fatigue  Although he has been to a nutritionist with his PCP does not cut back on high fat meats and snacks and only changes he has made his cutting out sweets and drinks containing sugar  He does not do any walking for exercise         Side effects from medications have been: None  Compliance with the medical regimen: Fair Hypoglycemia:   Never  Glucose monitoring:  done 1 times a day         Glucometer: One Touch.       Blood Glucose readings by time of day and averages from meter download:  FASTING blood sugar range 162-327 with median 255, not doing any readings after meals  PREMEAL Breakfast Lunch Dinner Bedtime  Overall   Glucose range:       Median:        POST-MEAL PC  Breakfast PC Lunch PC Dinner  Glucose range:     Median:      Self-care: The diet that the patient has been following is: tries to limit sweets.     Meal times are:  Breakfast is at 8-9 AM  Typical meal intake: Breakfast is Cheerios  cereal              Dietician visit, most recent: 2019               Exercise:  None  Weight history:  Wt Readings from Last 3 Encounters:  02/04/18 184 lb (83.5 kg)  11/25/17 183 lb (83 kg)  11/10/17 183 lb (83 kg)    Glycemic control:   Lab Results  Component Value Date   HGBA1C 12.1 (A) 02/04/2018   HGBA1C 9.9 08/23/2017   HGBA1C 7.2 (H) 01/10/2016   Lab Results  Component Value Date   MICROALBUR neg 11/28/2014   LDLCALC 49 08/23/2017   CREATININE 1.95 (H) 08/23/2017   No results found for: MICRALBCREAT  No results found for: FRUCTOSAMINE    Allergies as of 02/04/2018   No Known Allergies     Medication List        Accurate as of 02/04/18  12:51 PM. Always use your most recent med list.          amLODipine 10 MG tablet Commonly known as:  NORVASC TAKE 1 TABLET BY MOUTH  DAILY   ammonium lactate 12 % cream Commonly known as:  AMLACTIN APPLY TO DRY SKIN ON BOTTOM OF FEET 2 TIMES A DAY   aspirin EC 81 MG tablet Take 1 tablet (81 mg total) by mouth daily.   atorvastatin 40 MG tablet Commonly known as:  LIPITOR Take 1 tablet (40 mg total) by mouth daily.   atropine 1 % ophthalmic solution Place 1 drop into the left eye daily.   Dulaglutide 0.75 MG/0.5ML Sopn Commonly known as:  TRULICITY Inject in the abdominal skin as directed once a week   EASY TOUCH PEN NEEDLES 31G X 5 MM Misc Generic drug:  Insulin Pen Needle USE EVERY MORNING   hydrochlorothiazide 25 MG tablet Commonly known as:  HYDRODIURIL Take 1 tablet (25 mg total) by mouth daily.   insulin degludec 100 UNIT/ML Sopn FlexTouch Pen Commonly known as:  TRESIBA FLEXTOUCH INJECT SUBCUTANEOUSLY 60 UNITS EVERY MORNING.   JANUVIA 50 MG tablet Generic  drug:  sitaGLIPtin TAKE 1 TABLET BY MOUTH  DAILY   latanoprost 0.005 % ophthalmic solution Commonly known as:  XALATAN Place 1 drop into the left eye at bedtime.   lisinopril 40 MG tablet Commonly known as:  PRINIVIL,ZESTRIL Take 1 tablet (40 mg total) by mouth daily.   metoprolol succinate 100 MG 24 hr tablet Commonly known as:  TOPROL-XL TAKE 1 TABLET BY MOUTH  DAILY WITH OR IMMEDIATELY  FOLLOWING A MEAL   ONE TOUCH ULTRA TEST test strip Generic drug:  glucose blood Use to check BG twice daily   ONETOUCH DELICA LANCETS 75T Misc Use to check BG twice daily   VITAMIN B6 PO Take by mouth daily.   vitamin C 500 MG tablet Commonly known as:  ASCORBIC ACID Take 500 mg by mouth daily.   vitamin E 1000 UNIT capsule Generic drug:  vitamin E Take 1,000 Units by mouth daily.       Allergies: No Known Allergies  Past Medical History:  Diagnosis Date  . AKI (acute kidney injury) ([place])   . Allergy   . Anxiety   . Arthritis    Right shoulder  . Atrial fibrillation ([place])   . Carotid artery stenosis   . Cataract   . CHF (congestive heart failure) ([place])   . CVA (cerebral vascular accident) ([place]) 04/28/2016  . Decreased vision    left eye  . Diabetes mellitus without complication ([place])    Takes Metformin  . Hyperlipidemia   . Hypertension   . Salmonella bacteremia 04/29/2016  . Sepsis ([place]) 04/29/2016  . Stroke ([place]) 11/18/2015   no deficits  . TIA (transient ischemic attack)    affected left eye  . Urgency of urination     Past Surgical History:  Procedure Laterality Date  . AORTIC VALVE REPLACEMENT    . BACK SURGERY  80s  . CARDIAC VALVE REPLACEMENT  2010  . ENDARTERECTOMY Right 01/13/2016   Procedure: ENDARTERECTOMY CAROTID - RIGHT;  Surgeon: Conrad Elkhart, MD;  Location: [facility];  Service: Vascular;  Laterality: Right;  . EYE SURGERY    . laser surgery, left eye  Left 10-29-2015    retinal surgery/ Deloria Lair MD   . PERIPHERAL VASCULAR CATHETERIZATION Right  11/18/2015   Procedure: Carotid Angiography;  Surgeon: Conrad [place], MD;  Location: [facility]  CV LAB;  Service: Cardiovascular;  Laterality: Right;  . PERIPHERAL VASCULAR CATHETERIZATION N/A 11/18/2015   Procedure: Aortic Arch Angiography;  Surgeon: Conrad [place], MD;  Location: [facility] CV LAB;  Service: Cardiovascular;  Laterality: N/A;  . TEE WITHOUT CARDIOVERSION N/A 05/05/2016   Procedure: TRANSESOPHAGEAL ECHOCARDIOGRAM (TEE);  Surgeon: Lelon Perla, MD;  Location: [facility] ENDOSCOPY;  Service: Cardiovascular;  Laterality: N/A;    Family History  Problem Relation Age of Onset  . Heart disease Mother   . Cancer Mother        breast  . Stroke Father 33  . Hypertension Father   . Early death Sister        infant death  . Heart disease Brother   . Hydrocephalus Brother   . Heart disease Brother   . Diabetes Brother   . Cancer Brother        prostat  . Healthy Son   . Healthy Son     Social History:  reports that he quit smoking about 31 years ago. His smoking use included cigarettes. He has a 40.00 pack-year smoking history. He has never used smokeless tobacco. He reports that he does not drink alcohol or use drugs.   Review of Systems  Constitutional: Negative for weight loss and reduced appetite.  Eyes: Negative for blurred vision.  Respiratory: Negative for shortness of breath.   Cardiovascular: Negative for chest pain, leg swelling and claudication.  Gastrointestinal: Negative for diarrhea.  Endocrine: Negative for fatigue.  Genitourinary: Positive for frequency and nocturia.  Musculoskeletal: Negative for joint pain.  Skin: Negative for rash.  Neurological: Positive for balance difficulty. Negative for weakness, numbness and tingling.     Lipid history: Currently on atorvastatin 100 mg daily    Lab Results  Component Value Date   CHOL 100 08/23/2017   HDL 32 (L) 08/23/2017   LDLCALC 49 08/23/2017   LDLDIRECT 62 02/16/2017   TRIG 96 08/23/2017   CHOLHDL 3.1  08/23/2017           Hypertension:  On multiple drugs including hydrochlorothiazide 25 mg and lisinopril 40 mg daily, Norvasc 10 mg daily prescribed by PCP  BP Readings from Last 3 Encounters:  02/04/18 140/74  11/25/17 120/63  11/10/17 (!) 116/59    Most recent eye exam was in 7/18  Most recent foot exam: 7/19    LABS:  Abstract on 02/04/2018  Component Date Value Ref Range Status  . Hemoglobin A1C 08/23/2017 9.9   Final  Office Visit on 02/04/2018  Component Date Value Ref Range Status  . Hemoglobin A1C 02/04/2018 12.1* 4.0 - 5.6 % Final  . POC Glucose 02/04/2018 356* 70 - 99 mg/dl Final    Physical Examination:  BP 140/74 (BP Location: Left Arm, Patient Position: Sitting, Cuff Size: Normal)   Pulse (!) 55   Ht 5' 4.5" (1.638 m)   Wt 184 lb (83.5 kg)   SpO2 97%   BMI 31.10 kg/m   GENERAL:         Patient has abdominal obesity.    HEENT:         Eye exam shows normal external appearance.  Fundus exam shows no retinopathy on the right, left fundus not visible.  Oral exam shows normal mucosa .   NECK:   There is no lymphadenopathy Thyroid is not enlarged and no nodules felt.  Carotids are normal to palpation and no bruit heard, has radiating cardiac murmur  LUNGS:  Chest is symmetrical. Lungs are clear to auscultation.Marland Kitchen    HEART:         Heart sounds:  S1 and S2 are normal.  3/6 ejection murmur at the base radiating to carotids heard ., no S3 or S4.    ABDOMEN:   There is no distention present. Liver indistinctly palpable, spleen not palpable. No other mass or tenderness present.    NEUROLOGICAL:   Ankle jerks are absent bilaterally.    Diabetic Foot Exam - Simple   Simple Foot Form Visual Inspection No deformities, no ulcerations, no other skin breakdown bilaterally:  Yes See comments:  Yes Sensation Testing Intact to touch and monofilament testing bilaterally:  Yes Pulse Check See comments:  Yes Comments Onychomycosis of nails, dry skin  present Pedal pulses not palpable            Vibration sense is moderately reduced in distal first toes.  MUSCULOSKELETAL:  There is no swelling or deformity of the peripheral joints.     EXTREMITIES:     There is no edema.  SKIN:       No rash or lesions of concern.        ASSESSMENT:  Diabetes type 2, uncontrolled   See history of present illness for detailed discussion of current diabetes management, blood sugar patterns and problems identified  He has had significant hyperglycemia and appears to have progressively high sugars with basal insulin alone Also continues to have significantly high fasting blood sugars He can do better with his diet and does need weight loss to help with insulin resistance and abdominal obesity Currently not eating consistently low fat meals Not exercising at present   Complications of diabetes: None evident  Cardiovascular disease: He has had coronary bypass surgery, also a history of carotid stenosis and appears to have peripheral vascular disease by exam   PLAN:    He needs to start checking blood sugars at different times of the day with at least one reading after 1 of his meals and may check fasting blood sugar every other day  He may also be a candidate for freestyle libre testing system but currently does not qualify for Medicare requirements  Since he may gain weight with adding bolus insulin which would be the ideal treatment for his marked hyperglycemia will first give him a trial of a GLP-1 drug Discussed with the patient the nature of GLP-1 drugs, the action on various organ systems, how they benefit blood glucose control, as well as the benefit of weight loss and  increase satiety . Explained possible side effects, particularly nausea and vomiting that usually resolve over time; discussed safety information in package insert. Demonstrated the medication injection device and injection technique to the patient.  Showed patient where to  inject the medication. To start with 0.75 mg dosage weekly for the first 4 weeks Patient brochure on Trulicity with 12-WPY free co-pay card given  He will increase his [name] to 70 units and if not seeing blood sugar below 200 fasting and will go up to 80 units in about 2 weeks  Also on his next prescription he can change to the U-200 formulation  He will need to start an exercise program and given him various options if he does not want to walk outside  Start using low-fat meals especially meats and reduce snacks  Consultation with dietitian will be scheduled  Follow-up in 4 weeks and discussed that if he still has significant hyperglycemia especially postprandial will add  mealtime insulin also  Patient Instructions  Check blood sugars on waking up  3/7  Also check blood sugars about 2 hours after a meal and do this after different meals by rotation  Recommended blood sugar levels on waking up is 90-130 and about 2 hours after meal is 130-160  Please bring your blood sugar monitor to each visit, thank you  Start TRULICITYwith the pen as shown once weekly on the same day of the week.  You may inject in the stomach, thigh or arm as indicated in the brochure given.  You will feel fullness of the stomach with starting the medication and should try to keep the portions at meals small.  You may experience nausea in the first few days which usually gets better over time   If any questions or concerns are present call the office or the  [place] at [phone]. Also visit [email or web address].com website for more useful information  Insulin 70 units daily  Low fat meals  INDOOR EXERCISE IDEAS   Use the following examples for a creative indoor workout (perform each move for 2-3 minutes):   Warm up. Put on some music that makes you feel like moving, and dance around the living room.  Watch exercise shows on TV and move along with them. There are tons of free cable channels that  have daily exercise shows on them for all levels - beginner through advanced.   You can easily find a number of exercise videos but use one that will suit your liking and exercise level; you can do these on your own schedule.  Walk up and down the steps.  Do dumbbell curls and presses (if you don't have weights, use full water bottles).  Do assisted squats, keeping your back on a fitness ball against the wall or using the back of the couch for support.  Shadow box: Lift and lower the left leg; jab with the right arm, then the left; then lift and lower the right leg.  Fence (you don't even need swords). Pretend you're holding a sword in each hand. Create an X pattern standing still, then moving forward and back.  Hop on your exercise bike or treadmill -- or, for something different, use a weighted hula hoop. If you don't have any of those, just go back to dancing.  Do abdominal crunches (hold a weighted ball for added resistance).  Cool down with [employer] "I Feel Good" -- or whatever tune makes you feel good    Counseling time on subjects discussed in assessment and plan sections is over 50% of today's 60 minute visit   Consultation note has been sent to the referring physician  [name] 02/04/2018, 12:51 PM   Note: This office note was prepared with Dragon voice recognition system technology. Any transcriptional errors that result from this process are unintentional.

## 2018-02-04 ENCOUNTER — Encounter: Payer: Self-pay | Admitting: Endocrinology

## 2018-02-04 ENCOUNTER — Ambulatory Visit: Payer: Medicare Other | Admitting: Endocrinology

## 2018-02-04 ENCOUNTER — Encounter

## 2018-02-04 VITALS — BP 140/74 | HR 55 | Ht 64.5 in | Wt 184.0 lb

## 2018-02-04 DIAGNOSIS — E1165 Type 2 diabetes mellitus with hyperglycemia: Secondary | ICD-10-CM

## 2018-02-04 DIAGNOSIS — Z794 Long term (current) use of insulin: Secondary | ICD-10-CM | POA: Diagnosis not present

## 2018-02-04 DIAGNOSIS — N183 Chronic kidney disease, stage 3 unspecified: Secondary | ICD-10-CM

## 2018-02-04 DIAGNOSIS — E1122 Type 2 diabetes mellitus with diabetic chronic kidney disease: Secondary | ICD-10-CM

## 2018-02-04 LAB — POCT GLYCOSYLATED HEMOGLOBIN (HGB A1C): HEMOGLOBIN A1C: 12.1 % — AB (ref 4.0–5.6)

## 2018-02-04 LAB — GLUCOSE, POCT (MANUAL RESULT ENTRY): POC GLUCOSE: 356 mg/dL — AB (ref 70–99)

## 2018-02-04 MED ORDER — DULAGLUTIDE 0.75 MG/0.5ML ~~LOC~~ SOAJ
SUBCUTANEOUS | 0 refills | Status: DC
Start: 2018-02-04 — End: 2018-02-25

## 2018-02-04 NOTE — Telephone Encounter (Signed)
OV 02/28/18

## 2018-02-04 NOTE — Patient Instructions (Addendum)
Check blood sugars on waking up  3/7  Also check blood sugars about 2 hours after a meal and do this after different meals by rotation  Recommended blood sugar levels on waking up is 90-130 and about 2 hours after meal is 130-160  Please bring your blood sugar monitor to each visit, thank you  Start TRULICITYwith the pen as shown once weekly on the same day of the week.  You may inject in the stomach, thigh or arm as indicated in the brochure given.  You will feel fullness of the stomach with starting the medication and should try to keep the portions at meals small.  You may experience nausea in the first few days which usually gets better over time   If any questions or concerns are present call the office or the  [place] at [phone]. Also visit [email or web address].com website for more useful information  Insulin 70 units daily  Low fat meals  INDOOR EXERCISE IDEAS   Use the following examples for a creative indoor workout (perform each move for 2-3 minutes):   Warm up. Put on some music that makes you feel like moving, and dance around the living room.  Watch exercise shows on TV and move along with them. There are tons of free cable channels that have daily exercise shows on them for all levels - beginner through advanced.   You can easily find a number of exercise videos but use one that will suit your liking and exercise level; you can do these on your own schedule.  Walk up and down the steps.  Do dumbbell curls and presses (if you don't have weights, use full water bottles).  Do assisted squats, keeping your back on a fitness ball against the wall or using the back of the couch for support.  Shadow box: Lift and lower the left leg; jab with the right arm, then the left; then lift and lower the right leg.  Fence (you don't even need swords). Pretend you're holding a sword in each hand. Create an X pattern standing still, then moving forward and back.  Hop on  your exercise bike or treadmill -- or, for something different, use a weighted hula hoop. If you don't have any of those, just go back to dancing.  Do abdominal crunches (hold a weighted ball for added resistance).  Cool down with [employer] "I Feel Good" -- or whatever tune makes you feel good

## 2018-02-18 ENCOUNTER — Encounter: Payer: Self-pay | Admitting: Dietician

## 2018-02-18 ENCOUNTER — Encounter: Payer: Medicare Other | Attending: Endocrinology | Admitting: Dietician

## 2018-02-18 DIAGNOSIS — E1165 Type 2 diabetes mellitus with hyperglycemia: Secondary | ICD-10-CM | POA: Insufficient documentation

## 2018-02-18 DIAGNOSIS — Z713 Dietary counseling and surveillance: Secondary | ICD-10-CM | POA: Diagnosis not present

## 2018-02-18 DIAGNOSIS — E1122 Type 2 diabetes mellitus with diabetic chronic kidney disease: Secondary | ICD-10-CM

## 2018-02-18 DIAGNOSIS — Z794 Long term (current) use of insulin: Secondary | ICD-10-CM | POA: Diagnosis not present

## 2018-02-18 DIAGNOSIS — N183 Chronic kidney disease, stage 3 (moderate): Secondary | ICD-10-CM

## 2018-02-18 NOTE — Patient Instructions (Addendum)
Increase the Tresiba to 80 units  Consider drinking diet sprite or diet gingerale rather than diet coke [place] job not drinking sugar. Consider avoiding processed meats (vienna sausage, hotdogs, etc.) Continue to avoid added salt. Stay active most days- 30 minutes walking, golf, gym, armchair exercises or marching inside etc.   Change breakfast  Rather than cereal have peanut butter or cheese toast and fruit or egg sandwich on Whole Wheat Toast and fruit.  Half your plate should be vegetables. Eat fewer crackers.  Check your blood sugar every am and start checking it 2 hours after 1 meal per day.  Rotate which meal.  Please make an appointment with me when you see Dr. Dwyane Dee on August 26th.  You can see [place] on the same day when he wants you to follow up.

## 2018-02-18 NOTE — Progress Notes (Signed)
Diabetes Self-Management Education  Visit Type: First/Initial  Appt. Start Time: 1015 Appt. End Time: 5732  02/18/2018  [name], identified by name and date of birth, is a 77 y.o. male with a diagnosis of Diabetes: Type 2.  Patient is here today with his wife.  History includes HTN, hyperlipidemia, CKD, CVA, CHF, stroke, very poor vision left eye.  GFR 33 08/23/17.  Medications:  See list.  Patient was unable to tolerated Metformin due to severe uncontrolled diarrhea.  He was started on Insulin.  A1C had continued to rise to current 12.1%.  Trulicity was started 2/02 and this is improving his blood sugar readings.   Trulicity, Tresiba 70 units daily.  Reviewed Dr. Ronnie Stanley note.  Patient to increase to 80 units if blood sugar remains over 200.  It remains over 200 most days.  Instructed patient to increase to 80 units each am.    Patient lives with his wife.  They eat very simply.  Wife cooks some.  They eat out infrequently.  Patient states that he has given up sugar containing drinks, bread, and sweets.  Artificial sweeteners (probably sugar alcohol) causing diarrhea per patient so avoids.  He does eat a relatively high fat, processed diet with increased crackers and few fruits and vegetables or whole grains.  ASSESSMENT  Height 5' 7"  (1.702 m), weight 184 lb (83.5 kg). Body mass index is 28.82 kg/m.  Diabetes Self-Management Education - 02/18/18 1032      Visit Information   Visit Type  First/Initial      Initial Visit   Diabetes Type  Type 2    Are you currently following a meal plan?  No    Are you taking your medications as prescribed?  Yes    Date Diagnosed  2010      Health Coping   How would you rate your overall health?  Good      Psychosocial Assessment   Patient Belief/Attitude about Diabetes  Other (comment) It can be controlled.  I don't think about it.    Self-care barriers  None    Self-management support  Doctor's office;Family    Other persons present   Patient;Spouse/SO    Patient Concerns  Nutrition/Meal planning;Glycemic Control;Weight Control    Special Needs  None    Preferred Learning Style  No preference indicated    Learning Readiness  Ready    How often do you need to have someone help you when you read instructions, pamphlets, or other written materials from your doctor or pharmacy?  1 - Never    What is the last grade level you completed in school?  10th      Pre-Education Assessment   Patient understands the diabetes disease and treatment process.  Needs Review    Patient understands incorporating nutritional management into lifestyle.  Needs Review    Patient undertands incorporating physical activity into lifestyle.  Needs Review    Patient understands using medications safely.  Needs Review    Patient understands monitoring blood glucose, interpreting and using results  Needs Review    Patient understands prevention, detection, and treatment of acute complications.  Needs Review    Patient understands prevention, detection, and treatment of chronic complications.  Needs Review    Patient understands how to develop strategies to address psychosocial issues.  Needs Review    Patient understands how to develop strategies to promote health/change behavior.  Needs Review      Complications   Last HgB A1C  per patient/outside source  12.1 % 02/04/18 increased from 9.9% 08/23/17    How often do you check your blood sugar?  1-2 times/day    Fasting Blood glucose range (mg/dL)  180-200;130-179;>200    Have you had a dilated eye exam in the past 12 months?  Yes    Have you had a dental exam in the past 12 months?  No dentures    Are you checking your feet?  Yes    How many days per week are you checking your feet?  7      Dietary Intake   Breakfast  whipped regular yogurt OR honey nut cheerios, 2% milk OR bacon and eggs, crackers occasional    Snack (morning)  none    Lunch  can of vienna sausage, crackers OR hot dog or Hamburger on  bun    Snack (afternoon)  none    Dinner  salmon patties or tuna salad,  potato salad, green peas, lettuce and tomat, pinto beans    Snack (evening)  none    Beverage(s)  water, diet soda (<1 per day)      Exercise   Exercise Type  Light (walking / raking leaves);ADL's      Patient Education   Previous Diabetes Education  Yes (please comment) RD at local MD office a while ago    Disease state   Definition of diabetes, type 1 and 2, and the diagnosis of diabetes    Nutrition management   Role of diet in the treatment of diabetes and the relationship between the three main macronutrients and blood glucose level;Meal options for control of blood glucose level and chronic complications.;Information on hints to eating out and maintain blood glucose control.;Food label reading, portion sizes and measuring food.    Physical activity and exercise   Role of exercise on diabetes management, blood pressure control and cardiac health.    Medications  Reviewed patients medication for diabetes, action, purpose, timing of dose and side effects.    Monitoring  Purpose and frequency of SMBG.;Identified appropriate SMBG and/or A1C goals.    Acute complications  Taught treatment of hypoglycemia - the 15 rule.    Chronic complications  Relationship between chronic complications and blood glucose control;Identified and discussed with patient  current chronic complications    Psychosocial adjustment  Role of stress on diabetes      Individualized Goals (developed by patient)   Nutrition  General guidelines for healthy choices and portions discussed    Physical Activity  Exercise 5-7 days per week;30 minutes per day    Medications  take my medication as prescribed    Monitoring   test my blood glucose as discussed    Problem Solving  meal planning    Reducing Risk  increase portions of healthy fats;treat hypoglycemia with 15 grams of carbs if blood glucose less than 53m/dL    Health Coping  discuss diabetes  with (comment) MD, RD, CDE      Post-Education Assessment   Patient understands the diabetes disease and treatment process.  Demonstrates understanding / competency    Patient understands incorporating nutritional management into lifestyle.  Needs Review    Patient undertands incorporating physical activity into lifestyle.  Demonstrates understanding / competency    Patient understands using medications safely.  Demonstrates understanding / competency    Patient understands monitoring blood glucose, interpreting and using results  Demonstrates understanding / competency    Patient understands prevention, detection, and treatment of acute complications.  Demonstrates understanding / competency    Patient understands prevention, detection, and treatment of chronic complications.  Demonstrates understanding / competency    Patient understands how to develop strategies to address psychosocial issues.  Demonstrates understanding / competency    Patient understands how to develop strategies to promote health/change behavior.  Needs Review      Outcomes   Expected Outcomes  Demonstrated interest in learning. Expect positive outcomes    Future DMSE  3-4 months    Program Status  Completed       Individualized Plan for Diabetes Self-Management Training:   Learning Objective:  Patient will have a greater understanding of diabetes self-management. Patient education plan is to attend individual and/or group sessions per assessed needs and concerns.   Plan:   Patient Instructions  Increase the Tresiba to 80 units  Consider drinking diet sprite or diet gingerale rather than diet coke [place] job not drinking sugar. Consider avoiding processed meats (vienna sausage, hotdogs, etc.) Continue to avoid added salt. Stay active most days- 30 minutes walking, golf, gym, armchair exercises or marching inside etc.   Change breakfast  Rather than cereal have peanut butter or cheese toast and fruit or egg  sandwich on Whole Wheat Toast and fruit.  Half your plate should be vegetables. Eat fewer crackers.  Check your blood sugar every am and start checking it 2 hours after 1 meal per day.  Rotate which meal.  Please make an appointment with me when you see Dr. Dwyane Dee on August 26th.  You can see [place] on the same day when he wants you to follow up.    Expected Outcomes:  Demonstrated interest in learning. Expect positive outcomes  Education material provided: ADA Diabetes: Your Take Control Guide, Food label handouts, Meal plan card, My Plate and Snack sheet  If problems or questions, patient to contact team via:  Phone  Future DSME appointment: 3-4 months

## 2018-02-25 ENCOUNTER — Telehealth: Payer: Self-pay | Admitting: Endocrinology

## 2018-02-25 MED ORDER — DULAGLUTIDE 0.75 MG/0.5ML ~~LOC~~ SOAJ: SUBCUTANEOUS | 0 refills | Status: DC

## 2018-02-25 NOTE — Telephone Encounter (Signed)
Would you like his dose increased?

## 2018-02-25 NOTE — Telephone Encounter (Signed)
Okay to send 1 month refill for now

## 2018-02-25 NOTE — Telephone Encounter (Signed)
Sent!

## 2018-02-25 NOTE — Telephone Encounter (Signed)
Dulaglutide (TRULICITY) 5.49 IY/6.4BR SOPN   Patient's wife stated that he has been doing really well on this medication and would like to know if he should continue this. And if the doctor could send in a prescription    [place], [place]

## 2018-02-25 NOTE — Telephone Encounter (Signed)
Pt took his last pen today, is refill on current dose ok.

## 2018-02-25 NOTE — Telephone Encounter (Signed)
I would need to wait till he is seen to decide on the dosage and he should have enough medication until his next visit in a few days

## 2018-02-28 ENCOUNTER — Ambulatory Visit: Payer: Medicare Other | Admitting: Family Medicine

## 2018-02-28 ENCOUNTER — Ambulatory Visit (INDEPENDENT_AMBULATORY_CARE_PROVIDER_SITE_OTHER): Payer: Medicare Other | Admitting: Family Medicine

## 2018-02-28 ENCOUNTER — Encounter: Payer: Self-pay | Admitting: Family Medicine

## 2018-02-28 VITALS — BP 113/67 | HR 70 | Temp 96.8°F | Ht 67.0 in | Wt 184.0 lb

## 2018-02-28 DIAGNOSIS — N183 Chronic kidney disease, stage 3 unspecified: Secondary | ICD-10-CM

## 2018-02-28 DIAGNOSIS — E1122 Type 2 diabetes mellitus with diabetic chronic kidney disease: Secondary | ICD-10-CM

## 2018-02-28 DIAGNOSIS — I10 Essential (primary) hypertension: Secondary | ICD-10-CM | POA: Diagnosis not present

## 2018-02-28 MED ORDER — METOPROLOL SUCCINATE ER 100 MG PO TB24: ORAL_TABLET | ORAL | 1 refills | Status: DC

## 2018-02-28 MED ORDER — AMLODIPINE BESYLATE 10 MG PO TABS: 10.0000 mg | ORAL_TABLET | Freq: Every day | ORAL | 1 refills | Status: DC

## 2018-02-28 NOTE — Progress Notes (Signed)
Subjective: CC: DM2 PCP: Timmothy Euler, MD OEV:[id number] Evan Stanley is a 77 y.o. male presenting to clinic today for:  1. Type 2 Diabetes with hypertension and CKD 3:  Patient reports: High at home: 215; Low at home: 133, Taking medication(s): Tresiba 80 units, Started on Trulicity w/ Endocrine.  He reports compliance with his blood pressure medications and states that he needs refills on both the metoprolol and amlodipine.  Side effects: None.  He denies any significant GI side effects with the Trulicity.  Last eye exam: Due in the next 3 months to see Dr. Zadie Rhine.  He sees Dr. Carolynn Sayers as well every year. Last foot exam: Up-to-date Last A1c: 12.1 1 month ago.  Seeing endocrinology, Dr Dwyane Dee. Nephropathy screen indicated?: on ACE-I Last flu, zoster and/or pneumovax: UTD  ROS: denies fever, chills, dizziness, LOC, polydipsia, unintended weight loss/gain, foot ulcerations, numbness or tingling in extremities or chest pain.  He notes he typically urinates quite frequently but has no change in his baseline.    ROS: Per HPI  No Known Allergies Past Medical History:  Diagnosis Date  . AKI (acute kidney injury) ([place])   . Allergy   . Anxiety   . Arthritis    Right shoulder  . Atrial fibrillation ([place])   . Carotid artery stenosis   . Cataract   . CHF (congestive heart failure) ([place])   . CVA (cerebral vascular accident) ([place]) 04/28/2016  . Decreased vision    left eye  . Diabetes mellitus without complication ([place])    Takes Metformin  . Hyperlipidemia   . Hypertension   . Salmonella bacteremia 04/29/2016  . Sepsis ([place]) 04/29/2016  . Stroke ([place]) 11/18/2015   no deficits  . TIA (transient ischemic attack)    affected left eye  . Urgency of urination     Current Outpatient Medications:  .  amLODipine (NORVASC) 10 MG tablet, TAKE 1 TABLET BY MOUTH  DAILY, Disp: 90 tablet, Rfl: 1 .  ammonium lactate (AMLACTIN) 12 % cream, APPLY TO DRY SKIN ON BOTTOM OF FEET 2 TIMES A DAY, Disp: ,  Rfl: 4 .  aspirin EC 81 MG tablet, Take 1 tablet (81 mg total) by mouth daily., Disp: 90 tablet, Rfl: 3 .  atorvastatin (LIPITOR) 40 MG tablet, Take 1 tablet (40 mg total) by mouth daily., Disp: 90 tablet, Rfl: 2 .  atropine 1 % ophthalmic solution, Place 1 drop into the left eye daily. , Disp: , Rfl: 6 .  cetirizine (ZYRTEC) 10 MG tablet, Take 10 mg by mouth daily., Disp: , Rfl:  .  Dulaglutide (TRULICITY) 3.81 WE/9.9BZ SOPN, Inject in the abdominal skin as directed once a week, Disp: 4 pen, Rfl: 0 .  EASY TOUCH PEN NEEDLES 31G X 5 MM MISC, USE EVERY MORNING, Disp: 100 each, Rfl: 3 .  hydrochlorothiazide (HYDRODIURIL) 25 MG tablet, Take 1 tablet (25 mg total) by mouth daily., Disp: 90 tablet, Rfl: 3 .  insulin degludec (TRESIBA FLEXTOUCH) 100 UNIT/ML SOPN FlexTouch Pen, INJECT SUBCUTANEOUSLY 60 UNITS EVERY MORNING., Disp: 60 mL, Rfl: 2 .  latanoprost (XALATAN) 0.005 % ophthalmic solution, Place 1 drop into the left eye at bedtime. , Disp: , Rfl:  .  lisinopril (PRINIVIL,ZESTRIL) 40 MG tablet, Take 1 tablet (40 mg total) by mouth daily., Disp: 90 tablet, Rfl: 3 .  metoprolol succinate (TOPROL-XL) 100 MG 24 hr tablet, TAKE 1 TABLET BY MOUTH  DAILY WITH OR IMMEDIATELY  FOLLOWING A MEAL, Disp: 90 tablet, Rfl: 1 .  ONE  TOUCH ULTRA TEST test strip, Use to check BG twice daily, Disp: 200 each, Rfl: 3 .  ONETOUCH DELICA LANCETS 16W MISC, Use to check BG twice daily, Disp: 100 each, Rfl: 5 .  Pyridoxine HCl (VITAMIN B6 PO), Take by mouth daily., Disp: , Rfl:  .  vitamin C (ASCORBIC ACID) 500 MG tablet, Take 500 mg by mouth daily., Disp: , Rfl:  .  vitamin E (VITAMIN E) 1000 UNIT capsule, Take 1,000 Units by mouth daily., Disp: , Rfl:  Social History   Socioeconomic History  . Marital status: Married    Spouse name: Not on file  . Number of children: 2  . Years of education: 62  . Highest education level: 11th grade  Occupational History  . Occupation: maintenance    Employer: [employer]    Comment:  Retired  Scientific laboratory technician  . Financial resource strain: Not hard at all  . Food insecurity:    Worry: Never true    Inability: Never true  . Transportation needs:    Medical: No    Non-medical: No  Tobacco Use  . Smoking status: Former Smoker    Packs/day: 2.00    Years: 20.00    Pack years: 40.00    Types: Cigarettes    Last attempt to quit: 07/20/1986    Years since quitting: 31.6  . Smokeless tobacco: Never Used  Substance and Sexual Activity  . Alcohol use: No    Alcohol/week: 0.0 standard drinks  . Drug use: No  . Sexual activity: Yes  Lifestyle  . Physical activity:    Days per week: 3 days    Minutes per session: 30 min  . Stress: Only a little  Relationships  . Social connections:    Talks on phone: More than three times a week    Gets together: More than three times a week    Attends religious service: Never    Active member of club or organization: Yes    Attends meetings of clubs or organizations: More than 4 times per year    Relationship status: Married  . Intimate partner violence:    Fear of current or ex partner: No    Emotionally abused: No    Physically abused: No    Forced sexual activity: No  Other Topics Concern  . Not on file  Social History Narrative  . Not on file   Family History  Problem Relation Age of Onset  . Heart disease Mother   . Cancer Mother        breast  . Stroke Father 49  . Hypertension Father   . Early death Sister        infant death  . Heart disease Brother   . Hydrocephalus Brother   . Heart disease Brother   . Diabetes Brother   . Cancer Brother        prostat  . Healthy Son   . Healthy Son     Objective: Office vital signs reviewed. BP 113/67   Pulse 70   Temp (!) 96.8 F (36 C) (Oral)   Ht 5' 7"  (1.702 m)   Wt 184 lb (83.5 kg)   BMI 28.82 kg/m   Physical Examination:  General: Awake, alert, well nourished, No acute distress HEENT: sclera white, MMM Cardio: regular rate and rhythm, S1S2 heard, 2/6  SEM noted on RSB and LSB Pulm: clear to auscultation bilaterally, no wheezes, rhonchi or rales; normal work of breathing on room air Extremities: warm, well  perfused, No edema, cyanosis or clubbing; +2 pulses bilaterally  Assessment/ Plan: 77 y.o. male   1. Type 2 diabetes mellitus with stage 3 chronic kidney disease, without long-term current use of insulin ([place]) He has follow-up with Dr. Dwyane Dee next week.  He thinks he is still on the low dose Trulicity.  Tolerating medication well.  Continue to follow-up with endocrinology for diabetes needs. - Bayer DCA Hb A1c Waived  2. CKD stage 3 secondary to diabetes ([place]) Check BMP today.  On ACE inhibitor. - Basic Metabolic Panel  3. Essential hypertension Blood pressure well controlled.  Amlodipine and metoprolol have been refilled today.  Follow-up in about 4 months or sooner if needed. - Basic Metabolic Panel - amLODipine (NORVASC) 10 MG tablet; Take 1 tablet (10 mg total) by mouth daily.  Dispense: 90 tablet; Refill: 1   Orders Placed This Encounter  Procedures  . Bayer DCA Hb A1c Waived  . Basic Metabolic Panel   Meds ordered this encounter  Medications  . amLODipine (NORVASC) 10 MG tablet    Sig: Take 1 tablet (10 mg total) by mouth daily.    Dispense:  90 tablet    Refill:  1  . metoprolol succinate (TOPROL-XL) 100 MG 24 hr tablet    Sig: Take with or immediately following a meal.    Dispense:  90 tablet    Refill:  [place], DO [place] [phone]

## 2018-02-28 NOTE — Patient Instructions (Addendum)
You had labs performed today.  You will be contacted with the results of the labs once they are available, usually in the next 3 business days for routine lab work.   Continue to see Dr Dwyane Dee as scheduled for diabetes.  See me in 3-4 months for blood pressure.   Diabetes Mellitus and Exercise Exercising regularly is important for your overall health, especially when you have diabetes (diabetes mellitus). Exercising is not only about losing weight. It has many health benefits, such as increasing muscle strength and bone density and reducing body fat and stress. This leads to improved fitness, flexibility, and endurance, all of which result in better overall health. Exercise has additional benefits for people with diabetes, including:  Reducing appetite.  Helping to lower and control blood glucose.  Lowering blood pressure.  Helping to control amounts of fatty substances (lipids) in the blood, such as cholesterol and triglycerides.  Helping the body to respond better to insulin (improving insulin sensitivity).  Reducing how much insulin the body needs.  Decreasing the risk for heart disease by: ? Lowering cholesterol and triglyceride levels. ? Increasing the levels of good cholesterol. ? Lowering blood glucose levels.  What is my activity plan? Your health care provider or certified diabetes educator can help you make a plan for the type and frequency of exercise (activity plan) that works for you. Make sure that you:  Do at least 150 minutes of moderate-intensity or vigorous-intensity exercise each week. This could be brisk walking, biking, or water aerobics. ? Do stretching and strength exercises, such as yoga or weightlifting, at least 2 times a week. ? Spread out your activity over at least 3 days of the week.  Get some form of physical activity every day. ? Do not go more than 2 days in a row without some kind of physical activity. ? Avoid being inactive for more than 90  minutes at a time. Take frequent breaks to walk or stretch.  Choose a type of exercise or activity that you enjoy, and set realistic goals.  Start slowly, and gradually increase the intensity of your exercise over time.  What do I need to know about managing my diabetes?  Check your blood glucose before and after exercising. ? If your blood glucose is higher than 240 mg/dL (13.3 mmol/L) before you exercise, check your urine for ketones. If you have ketones in your urine, do not exercise until your blood glucose returns to normal.  Know the symptoms of low blood glucose (hypoglycemia) and how to treat it. Your risk for hypoglycemia increases during and after exercise. Common symptoms of hypoglycemia can include: ? Hunger. ? Anxiety. ? Sweating and feeling clammy. ? Confusion. ? Dizziness or feeling light-headed. ? Increased heart rate or palpitations. ? Blurry vision. ? Tingling or numbness around the mouth, lips, or tongue. ? Tremors or shakes. ? Irritability.  Keep a rapid-acting carbohydrate snack available before, during, and after exercise to help prevent or treat hypoglycemia.  Avoid injecting insulin into areas of the body that are going to be exercised. For example, avoid injecting insulin into: ? The arms, when playing tennis. ? The legs, when jogging.  Keep records of your exercise habits. Doing this can help you and your health care provider adjust your diabetes management plan as needed. Write down: ? Food that you eat before and after you exercise. ? Blood glucose levels before and after you exercise. ? The type and amount of exercise you have done. ? When your insulin  is expected to peak, if you use insulin. Avoid exercising at times when your insulin is peaking.  When you start a new exercise or activity, work with your health care provider to make sure the activity is safe for you, and to adjust your insulin, medicines, or food intake as needed.  Drink plenty of  water while you exercise to prevent dehydration or heat stroke. Drink enough fluid to keep your urine clear or pale yellow. This information is not intended to replace advice given to you by your health care provider. Make sure you discuss any questions you have with your health care provider. Document Released: 09/26/2003 Document Revised: 01/24/2016 Document Reviewed: 12/16/2015 Elsevier Interactive Patient Education  2018 [employer].

## 2018-03-01 ENCOUNTER — Other Ambulatory Visit: Payer: Self-pay | Admitting: Endocrinology

## 2018-03-01 LAB — BASIC METABOLIC PANEL
BUN/Creatinine Ratio: 12 (ref 10–24)
BUN: 22 mg/dL (ref 8–27)
CO2: 25 mmol/L (ref 20–29)
CREATININE: 1.81 mg/dL — AB (ref 0.76–1.27)
Calcium: 9.4 mg/dL (ref 8.6–10.2)
Chloride: 99 mmol/L (ref 96–106)
GFR, EST AFRICAN AMERICAN: 41 mL/min/{1.73_m2} — AB (ref >59)
GFR, EST NON AFRICAN AMERICAN: 36 mL/min/{1.73_m2} — AB (ref >59)
Glucose: 173 mg/dL — ABNORMAL HIGH (ref 65–99)
Potassium: 4.5 mmol/L (ref 3.5–5.2)
SODIUM: 137 mmol/L (ref 134–144)

## 2018-03-01 LAB — BAYER DCA HB A1C WAIVED: HB A1C (BAYER DCA - WAIVED): 11.4 % — ABNORMAL HIGH (ref <7.0)

## 2018-03-09 ENCOUNTER — Other Ambulatory Visit: Payer: Self-pay | Admitting: Endocrinology

## 2018-03-09 ENCOUNTER — Ambulatory Visit: Payer: Medicare Other | Admitting: Endocrinology

## 2018-03-09 ENCOUNTER — Encounter: Payer: Self-pay | Admitting: Endocrinology

## 2018-03-09 VITALS — BP 122/68 | HR 78 | Ht 63.0 in | Wt 186.0 lb

## 2018-03-09 DIAGNOSIS — E1165 Type 2 diabetes mellitus with hyperglycemia: Secondary | ICD-10-CM | POA: Diagnosis not present

## 2018-03-09 DIAGNOSIS — I10 Essential (primary) hypertension: Secondary | ICD-10-CM

## 2018-03-09 DIAGNOSIS — Z794 Long term (current) use of insulin: Secondary | ICD-10-CM | POA: Diagnosis not present

## 2018-03-09 MED ORDER — DULAGLUTIDE 1.5 MG/0.5ML ~~LOC~~ SOAJ: SUBCUTANEOUS | 4 refills | Status: DC

## 2018-03-09 NOTE — Patient Instructions (Addendum)
Less cereal and sweet fruits  Check blood sugars on waking up  4/7 days  Also check blood sugars about 2 hours after a meal and do this after different meals by rotation  Recommended blood sugar levels on waking up is 90-130 and about 2 hours after meal is 130-160  Please bring your blood sugar monitor to each visit, thank you  Walk daily  Trulicity 1.5 next week on

## 2018-03-09 NOTE — Progress Notes (Signed)
Patient ID: Evan Stanley, male   DOB: 04-14-1941, 77 y.o.   MRN: [id number]          Reason for Appointment: Follow-up for Type 2 Diabetes  Referring physician: Kenn File   History of Present Illness:          Date of diagnosis of type 2 diabetes mellitus: 2010       Background history:   He thinks he was diagnosed to have diabetes after his coronary bypass surgery and blood sugars are not very high He had been on metformin until about 05/2017 Also at some point had taken glimepiride Usually appears to have had A1c just above 7%  Recent history:   INSULIN regimen is: [place] 80  units daily     Non-insulin hypoglycemic drugs the patient is taking are: Trulicity 8.25 mg weekly  Current management, blood sugar patterns and problems identified:  He was started on Trulicity on his initial consultation in 01/2018  Prior to this his blood sugars are averaging about 250 at home with occasional readings over 300  He did not bring his monitor for download as instructed  Also despite instruction has checked blood sugars primarily in the morning and not after meals as directed  He has seen the dietitian earlier this month but not sure if he is making changes in his diet as yet, still eating cereal in the morning  Also has not started doing any walking for exercise as directed  Although his blood sugars are overall better they are extremely inconsistent with some reading as low as 120 or so and some as high as 240  Also his Evan Stanley was increased up to 80 units  No side effects from Trulicity and his weight is up a couple of pounds; he took his injection last Saturday, has just started a new box last weekend        Side effects from medications have been: None  Glucose monitoring:  done 1 times a day         Glucometer: One Touch.       Blood Glucose readings by review of home diary as above   Self-care: The diet that the patient has been following is: tries to limit  sweets.     Meal times are:  Breakfast is at 8-9 AM  Typical meal intake: Breakfast is cheerios            Dietician visit, most recent: 2019               Exercise:  None  Weight history:  Wt Readings from Last 3 Encounters:  03/09/18 186 lb (84.4 kg)  02/28/18 184 lb (83.5 kg)  02/18/18 184 lb (83.5 kg)    Glycemic control:   Lab Results  Component Value Date   HGBA1C 12.1 (A) 02/04/2018   HGBA1C 9.9 08/23/2017   HGBA1C 7.2 (H) 01/10/2016   Lab Results  Component Value Date   MICROALBUR neg 11/28/2014   LDLCALC 49 08/23/2017   CREATININE 1.81 (H) 02/28/2018   No results found for: MICRALBCREAT  No results found for: FRUCTOSAMINE    Allergies as of 03/09/2018   No Known Allergies     Medication List        Accurate as of 03/09/18  2:21 PM. Always use your most recent med list.          amLODipine 10 MG tablet Commonly known as:  NORVASC Take 1 tablet (10 mg total) by mouth daily.  ammonium lactate 12 % cream Commonly known as:  AMLACTIN APPLY TO DRY SKIN ON BOTTOM OF FEET 2 TIMES A DAY   aspirin EC 81 MG tablet Take 1 tablet (81 mg total) by mouth daily.   atorvastatin 40 MG tablet Commonly known as:  LIPITOR Take 1 tablet (40 mg total) by mouth daily.   atropine 1 % ophthalmic solution Place 1 drop into the left eye daily.   cetirizine 10 MG tablet Commonly known as:  ZYRTEC Take 10 mg by mouth daily.   EASY TOUCH PEN NEEDLES 31G X 5 MM Misc Generic drug:  Insulin Pen Needle USE EVERY MORNING   hydrochlorothiazide 25 MG tablet Commonly known as:  HYDRODIURIL Take 1 tablet (25 mg total) by mouth daily.   latanoprost 0.005 % ophthalmic solution Commonly known as:  XALATAN Place 1 drop into the left eye at bedtime.   lisinopril 40 MG tablet Commonly known as:  PRINIVIL,ZESTRIL Take 1 tablet (40 mg total) by mouth daily.   metoprolol succinate 100 MG 24 hr tablet Commonly known as:  TOPROL-XL Take with or immediately following a  meal.   ONE TOUCH ULTRA TEST test strip Generic drug:  glucose blood Use to check BG twice daily   ONETOUCH DELICA LANCETS 45Y Misc Use to check BG twice daily   TRESIBA FLEXTOUCH 100 UNIT/ML Sopn FlexTouch Pen Generic drug:  insulin degludec Inject 80 Units into the skin daily.   TRULICITY 0.99 IP/3.8SN Sopn Generic drug:  Dulaglutide INJECT IN THE ABDOMINAL SKIN AS DIRECTED ONCE A WEEK   Dulaglutide 1.5 MG/0.5ML Sopn Inject 1 pen weekly   VITAMIN B6 PO Take by mouth daily.   vitamin C 500 MG tablet Commonly known as:  ASCORBIC ACID Take 500 mg by mouth daily.   vitamin E 1000 UNIT capsule Generic drug:  vitamin E Take 1,000 Units by mouth daily.       Allergies: No Known Allergies  Past Medical History:  Diagnosis Date  . AKI (acute kidney injury) ([place])   . Allergy   . Anxiety   . Arthritis    Right shoulder  . Atrial fibrillation ([place])   . Carotid artery stenosis   . Cataract   . CHF (congestive heart failure) ([place])   . CVA (cerebral vascular accident) ([place]) 04/28/2016  . Decreased vision    left eye  . Diabetes mellitus without complication ([place])    Takes Metformin  . Hyperlipidemia   . Hypertension   . Salmonella bacteremia 04/29/2016  . Sepsis ([place]) 04/29/2016  . Stroke ([place]) 11/18/2015   no deficits  . TIA (transient ischemic attack)    affected left eye  . Urgency of urination     Past Surgical History:  Procedure Laterality Date  . AORTIC VALVE REPLACEMENT    . BACK SURGERY  80s  . CARDIAC VALVE REPLACEMENT  2010  . ENDARTERECTOMY Right 01/13/2016   Procedure: ENDARTERECTOMY CAROTID - RIGHT;  Surgeon: Conrad Phenix, MD;  Location: [facility];  Service: Vascular;  Laterality: Right;  . EYE SURGERY    . laser surgery, left eye  Left 10-29-2015    retinal surgery/ Deloria Lair MD   . PERIPHERAL VASCULAR CATHETERIZATION Right 11/18/2015   Procedure: Carotid Angiography;  Surgeon: Conrad Windsor, MD;  Location: [facility];  Service:  Cardiovascular;  Laterality: Right;  . PERIPHERAL VASCULAR CATHETERIZATION N/A 11/18/2015   Procedure: Aortic Arch Angiography;  Surgeon: Conrad Bonanza, MD;  Location: [facility];  Service: Cardiovascular;  Laterality: N/A;  . TEE WITHOUT CARDIOVERSION N/A 05/05/2016   Procedure: TRANSESOPHAGEAL ECHOCARDIOGRAM (TEE);  Surgeon: Lelon Perla, MD;  Location[facility];  Service: Cardiovascular;  Laterality: N/A;    Family History  Problem Relation Age of Onset  . Heart disease Mother   . Cancer Mother        breast  . Stroke Father 76  . Hypertension Father   . Early death Sister        infant death  . Heart disease Brother   . Hydrocephalus Brother   . Heart disease Brother   . Diabetes Brother   . Cancer Brother        prostat  . Healthy Son   . Healthy Son     Social History:  reports that he quit smoking about 31 years ago. His smoking use included cigarettes. He has a 40.00 pack-year smoking history. He has never used smokeless tobacco. He reports that he does not drink alcohol or use drugs.   Review of Systems  Constitutional: Negative for weight loss and reduced appetite.  Eyes: Negative for blurred vision.  Respiratory: Negative for shortness of breath.   Cardiovascular: Negative for chest pain, leg swelling and claudication.  Gastrointestinal: Negative for diarrhea.  Endocrine: Negative for fatigue.  Genitourinary: Positive for frequency and nocturia.  Musculoskeletal: Negative for joint pain.  Skin: Negative for rash.  Neurological: Positive for balance difficulty. Negative for weakness, numbness and tingling.     Lipid history: Currently on atorvastatin 100 mg daily    Lab Results  Component Value Date   CHOL 100 08/23/2017   HDL 32 (L) 08/23/2017   LDLCALC 49 08/23/2017   LDLDIRECT 62 02/16/2017   TRIG 96 08/23/2017   CHOLHDL 3.1 08/23/2017           Hypertension:  On multiple drugs including hydrochlorothiazide 25 mg and lisinopril 40 mg  daily, Norvasc 10 mg daily prescribed by PCP  BP Readings from Last 3 Encounters:  03/09/18 122/68  02/28/18 113/67  02/04/18 140/74    Most recent eye exam was in 7/18  Most recent foot exam: 7/19    LABS:  No visits with results within 1 Week(s) from this visit.  Latest known visit with results is:  Office Visit on 02/28/2018  Component Date Value Ref Range Status  . HB A1C (BAYER DCA - WAIVED) 02/28/2018 11.4* <7.0 % Final   Comment:                                       Diabetic Adult            <7.0                                       Healthy Adult        4.3 - 5.7                                                           (DCCT/NGSP) American Diabetes Association's Summary of Glycemic Recommendations for Adults with Diabetes: Hemoglobin A1c <7.0%. More stringent glycemic goals (A1c <6.0%)  may further reduce complications at the cost of increased risk of hypoglycemia.   . Glucose 02/28/2018 173* 65 - 99 mg/dL Final  . BUN 02/28/2018 22  8 - 27 mg/dL Final  . Creatinine, Ser 02/28/2018 1.81* 0.76 - 1.27 mg/dL Final  . GFR calc non Af Amer 02/28/2018 36* >59 mL/min/1.73 Final  . GFR calc Af Amer 02/28/2018 41* >59 mL/min/1.73 Final  . BUN/Creatinine Ratio 02/28/2018 12  10 - 24 Final  . Sodium 02/28/2018 137  134 - 144 mmol/L Final  . Potassium 02/28/2018 4.5  3.5 - 5.2 mmol/L Final  . Chloride 02/28/2018 99  96 - 106 mmol/L Final  . CO2 02/28/2018 25  20 - 29 mmol/L Final  . Calcium 02/28/2018 9.4  8.6 - 10.2 mg/dL Final    Physical Examination:  BP 122/68   Pulse 78   Ht 5' 3"  (1.6 m)   Wt 186 lb (84.4 kg)   SpO2 98%   BMI 32.95 kg/m   No pedal edema present         ASSESSMENT:  Diabetes type 2, uncontrolled on insulin  See history of present illness for detailed discussion of current diabetes management, blood sugar patterns and problems identified  His most recent A1c is 11.4  He likely has significant insulin deficiency Currently on basal  insulin and Trulicity  Although his fasting readings are at times near normal with adding Trulicity and increasing his basal insulin he still has some readings over 200 Also despite instructions he is not checking readings after meals He has not changed his diet as discussed with dietitian Currently getting excessive carbohydrate at times with fruits, cereal in the morning at breakfast and occasional soft drinks with sugar Day-to-day management of diabetes was discussed Blood sugar targets were discussed  Not exercising despite reminders also  HYPERTENSION: Well controlled   PLAN:    He needs to start checking blood sugars after meals at least every other day and some after different meals  Discussed that he may be able to improve his diet with monitoring after meals  We will increase the Trulicity up to 1.5 mg starting the week after next using 2 injections of the 0.75 that he has  No change in [place] as yet  Discussed that if he has consistently high readings after meals especially in the evening we will need to add a short acting insulin to cover his mealtime spikes, currently he is reluctant to add more injections  He needs to start walking early morning or late evening  Start watching his diet more consistently and avoid cereal in the morning and high sugar content foods and drinks such as watermelon and regular soft drinks  Check fructosamine on the next visit  Also discussed that if his blood sugars in the morning start going below 100 will need to cut back on his Evan Stanley  His wife was present during the visit and she will help him follow instructions  Patient Instructions  Less cereal and sweet fruits  Check blood sugars on waking up  4/7 days  Also check blood sugars about 2 hours after a meal and do this after different meals by rotation  Recommended blood sugar levels on waking up is 90-130 and about 2 hours after meal is 130-160  Please bring your blood sugar  monitor to each visit, thank you  Walk daily  Trulicity 1.5 next week on  Counseling time on subjects discussed in assessment and plan sections is over 50%  of today's 25 minute visit   [name] 03/09/2018, 2:21 PM   Note: This office note was prepared with Dragon voice recognition system technology. Any transcriptional errors that result from this process are unintentional.

## 2018-03-23 ENCOUNTER — Telehealth: Payer: Self-pay | Admitting: Emergency Medicine

## 2018-03-23 NOTE — Telephone Encounter (Signed)
Patients wife called and requested a refill on patients insulin degludec (TRESIBA FLEXTOUCH) 100 UNIT/ML SOPN FlexTouch Pen. Pharmacy is [facility]. Thanks.

## 2018-03-24 DIAGNOSIS — L84 Corns and callosities: Secondary | ICD-10-CM | POA: Diagnosis not present

## 2018-03-24 DIAGNOSIS — M79676 Pain in unspecified toe(s): Secondary | ICD-10-CM | POA: Diagnosis not present

## 2018-03-24 DIAGNOSIS — E1151 Type 2 diabetes mellitus with diabetic peripheral angiopathy without gangrene: Secondary | ICD-10-CM | POA: Diagnosis not present

## 2018-03-24 DIAGNOSIS — B351 Tinea unguium: Secondary | ICD-10-CM | POA: Diagnosis not present

## 2018-03-25 MED ORDER — INSULIN DEGLUDEC 100 UNIT/ML ~~LOC~~ SOPN: 80.0000 [IU] | PEN_INJECTOR | Freq: Every day | SUBCUTANEOUS | 1 refills | Status: DC

## 2018-03-25 NOTE — Telephone Encounter (Signed)
Sent!

## 2018-03-29 ENCOUNTER — Other Ambulatory Visit: Payer: Self-pay | Admitting: Endocrinology

## 2018-04-13 DIAGNOSIS — H34232 Retinal artery branch occlusion, left eye: Secondary | ICD-10-CM | POA: Diagnosis not present

## 2018-04-13 DIAGNOSIS — H43822 Vitreomacular adhesion, left eye: Secondary | ICD-10-CM | POA: Diagnosis not present

## 2018-04-13 DIAGNOSIS — E119 Type 2 diabetes mellitus without complications: Secondary | ICD-10-CM | POA: Diagnosis not present

## 2018-04-13 DIAGNOSIS — H4052X3 Glaucoma secondary to other eye disorders, left eye, severe stage: Secondary | ICD-10-CM | POA: Diagnosis not present

## 2018-04-13 DIAGNOSIS — H211X2 Other vascular disorders of iris and ciliary body, left eye: Secondary | ICD-10-CM | POA: Diagnosis not present

## 2018-04-22 ENCOUNTER — Other Ambulatory Visit (INDEPENDENT_AMBULATORY_CARE_PROVIDER_SITE_OTHER): Payer: Medicare Other

## 2018-04-22 DIAGNOSIS — Z794 Long term (current) use of insulin: Secondary | ICD-10-CM

## 2018-04-22 DIAGNOSIS — E1165 Type 2 diabetes mellitus with hyperglycemia: Secondary | ICD-10-CM

## 2018-04-22 LAB — COMPREHENSIVE METABOLIC PANEL
ALBUMIN: 4 g/dL (ref 3.5–5.2)
ALT: 25 U/L (ref 0–53)
AST: 19 U/L (ref 0–37)
Alkaline Phosphatase: 84 U/L (ref 39–117)
BUN: 20 mg/dL (ref 6–23)
CALCIUM: 9.6 mg/dL (ref 8.4–10.5)
CHLORIDE: 103 meq/L (ref 96–112)
CO2: 28 mEq/L (ref 19–32)
Creatinine, Ser: 1.93 mg/dL — ABNORMAL HIGH (ref 0.40–1.50)
GFR: 36.23 mL/min — ABNORMAL LOW (ref >60.00)
Glucose, Bld: 187 mg/dL — ABNORMAL HIGH (ref 70–99)
POTASSIUM: 4.5 meq/L (ref 3.5–5.1)
Sodium: 139 mEq/L (ref 135–145)
Total Bilirubin: 0.5 mg/dL (ref 0.2–1.2)
Total Protein: 6.8 g/dL (ref 6.0–8.3)

## 2018-04-23 LAB — FRUCTOSAMINE: FRUCTOSAMINE: 323 umol/L — AB (ref 0–285)

## 2018-04-27 ENCOUNTER — Encounter: Payer: Self-pay | Admitting: Endocrinology

## 2018-04-27 ENCOUNTER — Ambulatory Visit: Payer: Medicare Other | Admitting: Endocrinology

## 2018-04-27 VITALS — BP 118/80 | HR 78 | Ht 63.0 in | Wt 186.0 lb

## 2018-04-27 DIAGNOSIS — E1165 Type 2 diabetes mellitus with hyperglycemia: Secondary | ICD-10-CM | POA: Diagnosis not present

## 2018-04-27 DIAGNOSIS — Z794 Long term (current) use of insulin: Secondary | ICD-10-CM

## 2018-04-27 MED ORDER — INSULIN LISPRO 100 UNIT/ML (KWIKPEN): PEN_INJECTOR | SUBCUTANEOUS | 1 refills | Status: DC

## 2018-04-27 NOTE — Progress Notes (Signed)
Patient ID: Evan Stanley, male   DOB: 06/08/1941, 77 y.o.   MRN: [id number]          Reason for Appointment: Follow-up for Type 2 Diabetes  Referring physician: Kenn File   History of Present Illness:          Date of diagnosis of type 2 diabetes mellitus: 2010       Background history:   He thinks he was diagnosed to have diabetes after his coronary bypass surgery and blood sugars are not very high He had been on metformin until about 05/2017 Also at some point had taken glimepiride Usually appears to have had A1c just above 7%  Recent history:   INSULIN regimen is: [place] 80  units daily     Non-insulin hypoglycemic drugs the patient is taking are: Trulicity 1.5 mg weekly  Current management, blood sugar patterns and problems identified:  He was advised to check his sugars after meals but he has done only one reading which was over 200  He thinks this was high because of eating some ice cream and bread  As before his fasting readings are persistently high but quite variable  He has done minimal exercise  Has been told to cut back on high sugar drinks and he is trying to cut back on high fat foods and meats  However still eating cereal in the morning        Side effects from medications have been: None  Glucose monitoring:  done 1 times a day         Glucometer: One Touch.       Blood Glucose readings by review of home monitor download show  FASTING blood sugar range 119-294 with median 184   Self-care: The diet that the patient has been following is: tries to limit sweets.     Meal times are:  Breakfast is at 8-9 AM  Typical meal intake: Breakfast is cheerios            Dietician visit, most recent: 2019               Exercise:  minimal  Weight history:  Wt Readings from Last 3 Encounters:  04/27/18 186 lb (84.4 kg)  03/09/18 186 lb (84.4 kg)  02/28/18 184 lb (83.5 kg)    Glycemic control:   Lab Results  Component Value Date   HGBA1C 12.1  (A) 02/04/2018   HGBA1C 9.9 08/23/2017   HGBA1C 7.2 (H) 01/10/2016   Lab Results  Component Value Date   MICROALBUR neg 11/28/2014   LDLCALC 49 08/23/2017   CREATININE 1.93 (H) 04/22/2018   No results found for: MICRALBCREAT  Lab Results  Component Value Date   FRUCTOSAMINE 323 (H) 04/22/2018      Allergies as of 04/27/2018   No Known Allergies     Medication List        Accurate as of 04/27/18  3:21 PM. Always use your most recent med list.          amLODipine 10 MG tablet Commonly known as:  NORVASC Take 1 tablet (10 mg total) by mouth daily.   ammonium lactate 12 % cream Commonly known as:  AMLACTIN APPLY TO DRY SKIN ON BOTTOM OF FEET 2 TIMES A DAY   aspirin EC 81 MG tablet Take 1 tablet (81 mg total) by mouth daily.   atorvastatin 40 MG tablet Commonly known as:  LIPITOR Take 1 tablet (40 mg total) by mouth daily.   atropine  1 % ophthalmic solution Place 1 drop into the left eye daily.   cetirizine 10 MG tablet Commonly known as:  ZYRTEC Take 10 mg by mouth daily.   Dulaglutide 1.5 MG/0.5ML Sopn Inject 1 pen weekly   EASY TOUCH PEN NEEDLES 31G X 5 MM Misc Generic drug:  Insulin Pen Needle USE EVERY MORNING   hydrochlorothiazide 25 MG tablet Commonly known as:  HYDRODIURIL Take 1 tablet (25 mg total) by mouth daily.   insulin degludec 100 UNIT/ML Sopn FlexTouch Pen Commonly known as:  TRESIBA Inject 0.8 mLs (80 Units total) into the skin daily.   insulin lispro 100 UNIT/ML KiwkPen Commonly known as:  HUMALOG 10 units ac bid   latanoprost 0.005 % ophthalmic solution Commonly known as:  XALATAN Place 1 drop into the left eye at bedtime.   lisinopril 40 MG tablet Commonly known as:  PRINIVIL,ZESTRIL Take 1 tablet (40 mg total) by mouth daily.   metoprolol succinate 100 MG 24 hr tablet Commonly known as:  TOPROL-XL Take with or immediately following a meal.   ONE TOUCH ULTRA TEST test strip Generic drug:  glucose blood Use to check BG  twice daily   ONETOUCH DELICA LANCETS 04U Misc Use to check BG twice daily   VITAMIN B6 PO Take by mouth daily.   vitamin C 500 MG tablet Commonly known as:  ASCORBIC ACID Take 500 mg by mouth daily.   vitamin E 1000 UNIT capsule Generic drug:  vitamin E Take 1,000 Units by mouth daily.       Allergies: No Known Allergies  Past Medical History:  Diagnosis Date  . AKI (acute kidney injury) ([place])   . Allergy   . Anxiety   . Arthritis    Right shoulder  . Atrial fibrillation ([place])   . Carotid artery stenosis   . Cataract   . CHF (congestive heart failure) ([place])   . CVA (cerebral vascular accident) ([place]) 04/28/2016  . Decreased vision    left eye  . Diabetes mellitus without complication ([place])    Takes Metformin  . Hyperlipidemia   . Hypertension   . Salmonella bacteremia 04/29/2016  . Sepsis ([place]) 04/29/2016  . Stroke ([place]) 11/18/2015   no deficits  . TIA (transient ischemic attack)    affected left eye  . Urgency of urination     Past Surgical History:  Procedure Laterality Date  . AORTIC VALVE REPLACEMENT    . BACK SURGERY  80s  . CARDIAC VALVE REPLACEMENT  2010  . ENDARTERECTOMY Right 01/13/2016   Procedure: ENDARTERECTOMY CAROTID - RIGHT;  Surgeon: Conrad Balta, MD;  Location: [facility];  Service: Vascular;  Laterality: Right;  . EYE SURGERY    . laser surgery, left eye  Left 10-29-2015    retinal surgery/ Deloria Lair MD   . PERIPHERAL VASCULAR CATHETERIZATION Right 11/18/2015   Procedure: Carotid Angiography;  Surgeon: Conrad Coffeen, MD;  Location: [facility];  Service: Cardiovascular;  Laterality: Right;  . PERIPHERAL VASCULAR CATHETERIZATION N/A 11/18/2015   Procedure: Aortic Arch Angiography;  Surgeon: Conrad [place] Hills, MD;  Location: [facility];  Service: Cardiovascular;  Laterality: N/A;  . TEE WITHOUT CARDIOVERSION N/A 05/05/2016   Procedure: TRANSESOPHAGEAL ECHOCARDIOGRAM (TEE);  Surgeon: Lelon Perla, MD;  Location[facility] END[facility];  Service:  Cardiovascular;  Laterality: N/A;    Family History  Problem Relation Age of Onset  . Heart disease Mother   . Cancer Mother  breast  . Stroke Father 36  . Hypertension Father   . Early death Sister        infant death  . Heart disease Brother   . Hydrocephalus Brother   . Heart disease Brother   . Diabetes Brother   . Cancer Brother        prostat  . Healthy Son   . Healthy Son     Social History:  reports that he quit smoking about 31 years ago. His smoking use included cigarettes. He has a 40.00 pack-year smoking history. He has never used smokeless tobacco. He reports that he does not drink alcohol or use drugs.   Review of Systems   Lipid history: Currently on atorvastatin 100 mg daily    Lab Results  Component Value Date   CHOL 100 08/23/2017   HDL 32 (L) 08/23/2017   LDLCALC 49 08/23/2017   LDLDIRECT 62 02/16/2017   TRIG 96 08/23/2017   CHOLHDL 3.1 08/23/2017           Hypertension:  On multiple drugs including hydrochlorothiazide 25 mg and lisinopril 40 mg daily, Norvasc 10 mg daily prescribed by PCP  BP Readings from Last 3 Encounters:  04/27/18 118/80  03/09/18 122/68  02/28/18 113/67    Most recent eye exam was in 7/18  Most recent foot exam: 7/19    LABS:  Lab on 04/22/2018  Component Date Value Ref Range Status  . Fructosamine 04/22/2018 323* 0 - 285 umol/L Final   Comment: Published reference interval for apparently healthy subjects between age 72 and 77 is 12 - 285 umol/L and in a poorly controlled diabetic population is 228 - 563 umol/L with a mean of 396 umol/L.   Marland Kitchen Sodium 04/22/2018 139  135 - 145 mEq/L Final  . Potassium 04/22/2018 4.5  3.5 - 5.1 mEq/L Final  . Chloride 04/22/2018 103  96 - 112 mEq/L Final  . CO2 04/22/2018 28  19 - 32 mEq/L Final  . Glucose, Bld 04/22/2018 187* 70 - 99 mg/dL Final  . BUN 04/22/2018 20  6 - 23 mg/dL Final  . Creatinine, Ser 04/22/2018 1.93* 0.40 - 1.50 mg/dL Final  . Total Bilirubin  04/22/2018 0.5  0.2 - 1.2 mg/dL Final  . Alkaline Phosphatase 04/22/2018 84  39 - 117 U/L Final  . AST 04/22/2018 19  0 - 37 U/L Final  . ALT 04/22/2018 25  0 - 53 U/L Final  . Total Protein 04/22/2018 6.8  6.0 - 8.3 g/dL Final  . Albumin 04/22/2018 4.0  3.5 - 5.2 g/dL Final  . Calcium 04/22/2018 9.6  8.4 - 10.5 mg/dL Final  . GFR 04/22/2018 36.23* >60.00 mL/min Final    Physical Examination:  BP 118/80   Pulse 78   Ht 5' 3"  (1.6 m)   Wt 186 lb (84.4 kg)   SpO2 98%   BMI 32.95 kg/m   No pedal edema present         ASSESSMENT:  Diabetes type 2, uncontrolled on insulin  See history of present illness for detailed discussion of current diabetes management, blood sugar patterns and problems identified  His most recent A1c is 11.4 in August Fructosamine is 35 which is relatively better  He has marked variability in his fasting readings with some near normal readings also indicating likely effects of postprandial hyperglycemia the night before Despite repeated reminders he does not check his sugars after meals and has only one reading Still not able to lose weight His  diet has been relatively better after seeing the dietitian but not consistent Still not losing weight  HYPERTENSION: Well controlled   PLAN:    Today discussed in detail the need for mealtime insulin to cover postprandial spikes, action of mealtime insulin, use of the insulin pen, timing and action of the rapid acting insulin as well as starting dose and dosage titration to target the two-hour reading of under 180  He will start with 10 units of Humalog with his main meal which ever it is either lunch or dinner  Given him information on mealtime insulin  He will need to of course check his sugars 2 hours after meals to help him adjust the doses on follow-up  Discussed timing of the injection, peak action and potential for hypoglycemia  He does need to bring his meter for download also  Continue cutting  back on high fat and high carbohydrate foods and drinks  No change in [place] as yet or Trulicity  He will see the nurse educator in 3 weeks also  Patient Instructions  Check blood sugars on waking up 5 days a week  Also check blood sugars about 2 hours after meals and do this after different meals by rotation  Recommended blood sugar levels on waking up are 90-130 and about 2 hours after meal is 130-180  Please bring your blood sugar monitor to each visit, thank you  Humalog before your MAIN meal, start 10 units    Counseling time on subjects discussed in assessment and plan sections is over 50% of today's 25 minute visit   [name] 04/27/2018, 3:21 PM   Note: This office note was prepared with Dragon voice recognition system technology. Any transcriptional errors that result from this process are unintentional.

## 2018-04-27 NOTE — Patient Instructions (Signed)
Check blood sugars on waking up 5 days a week  Also check blood sugars about 2 hours after meals and do this after different meals by rotation  Recommended blood sugar levels on waking up are 90-130 and about 2 hours after meal is 130-180  Please bring your blood sugar monitor to each visit, thank you  Humalog before your MAIN meal, start 10 units

## 2018-05-10 ENCOUNTER — Other Ambulatory Visit: Payer: Self-pay

## 2018-05-10 MED ORDER — HYDROCHLOROTHIAZIDE 25 MG PO TABS: 25.0000 mg | ORAL_TABLET | Freq: Every day | ORAL | 1 refills | Status: DC

## 2018-05-23 ENCOUNTER — Encounter: Payer: Medicare Other | Admitting: Nutrition

## 2018-05-23 ENCOUNTER — Ambulatory Visit (INDEPENDENT_AMBULATORY_CARE_PROVIDER_SITE_OTHER): Payer: Medicare Other

## 2018-05-23 DIAGNOSIS — Z23 Encounter for immunization: Secondary | ICD-10-CM

## 2018-05-26 NOTE — Progress Notes (Signed)
Cardiology Office Note:    Date:  05/27/2018   ID:  Evan Stanley, DOB 1941-03-01, MRN [id number]  PCP:  Evan Norlander, DO  Cardiologist:  Evan Grooms, MD   Referring MD: Evan Norlander, DO   Chief Complaint  Patient presents with  . Claudication  . Coronary Artery Disease  . Cardiac Valve Problem    History of Present Illness:    Evan Stanley is a 77 y.o. male with a hx of bioprosthetic aortic valve, hyperlipidemia, coronary artery disease with prior bypass grafting, left ventricular systolic dysfunction, right cerebral CVA (2017), total occlusion left ICA, and  DM type II.  [name] has become largely sedentary according to his wife.  He used to play golf.  He no longer participates in other activities.  When he goes to [employer] to shop he has to sit down because his calves hurt.  He has a pinpoint eschar on the anterior left lower extremity.  Been present for approximately 1 week.  No redness is noted.  He has not had any recurrence of neurological complaints.  Had a right brain ischemic event in 2017.  He has not had chest pain and denies dyspnea on exertion.  He is gaining weight.  Past Medical History:  Diagnosis Date  . AKI (acute kidney injury) ([place])   . Allergy   . Anxiety   . Arthritis    Right shoulder  . Atrial fibrillation ([place])   . Carotid artery stenosis   . Cataract   . CHF (congestive heart failure) ([place])   . CVA (cerebral vascular accident) ([place]) 04/28/2016  . Decreased vision    left eye  . Diabetes mellitus without complication ([place])    Takes Metformin  . Hyperlipidemia   . Hypertension   . Salmonella bacteremia 04/29/2016  . Sepsis ([place]) 04/29/2016  . Stroke ([place]) 11/18/2015   no deficits  . TIA (transient ischemic attack)    affected left eye  . Urgency of urination     Past Surgical History:  Procedure Laterality Date  . AORTIC VALVE REPLACEMENT    . BACK SURGERY  80s  . CARDIAC VALVE REPLACEMENT  2010  . ENDARTERECTOMY Right  01/13/2016   Procedure: ENDARTERECTOMY CAROTID - RIGHT;  Surgeon: Conrad Wolcott, MD;  Location: [facility];  Service: Vascular;  Laterality: Right;  . EYE SURGERY    . laser surgery, left eye  Left 10-29-2015    retinal surgery/ Deloria Lair MD   . PERIPHERAL VASCULAR CATHETERIZATION Right 11/18/2015   Procedure: Carotid Angiography;  Surgeon: Conrad Carson, MD;  Location: [facility];  Service: Cardiovascular;  Laterality: Right;  . PERIPHERAL VASCULAR CATHETERIZATION N/A 11/18/2015   Procedure: Aortic Arch Angiography;  Surgeon: Conrad Lone Wolf, MD;  Location: [facility];  Service: Cardiovascular;  Laterality: N/A;  . TEE WITHOUT CARDIOVERSION N/A 05/05/2016   Procedure: TRANSESOPHAGEAL ECHOCARDIOGRAM (TEE);  Surgeon: Lelon Perla, MD;  Location: [facility] ENDOSCOPY;  Service: Cardiovascular;  Laterality: N/A;    Current Medications: Current Meds  Medication Sig  . amLODipine (NORVASC) 10 MG tablet Take 1 tablet (10 mg total) by mouth daily.  Marland Kitchen ammonium lactate (AMLACTIN) 12 % cream APPLY TO DRY SKIN ON BOTTOM OF FEET 2 TIMES A DAY  . aspirin EC 81 MG tablet Take 1 tablet (81 mg total) by mouth daily.  Marland Kitchen atorvastatin (LIPITOR) 40 MG tablet Take 1 tablet (40 mg total) by mouth daily.  Marland Kitchen atropine 1 % ophthalmic solution  Place 1 drop into the left eye daily.   . cetirizine (ZYRTEC) 10 MG tablet Take 10 mg by mouth daily.  . Dulaglutide (TRULICITY) 1.5 IE/3.3IR SOPN Inject 1 pen weekly  . EASY TOUCH PEN NEEDLES 31G X 5 MM MISC USE EVERY MORNING  . hydrochlorothiazide (HYDRODIURIL) 25 MG tablet Take 1 tablet (25 mg total) by mouth daily.  . insulin degludec (TRESIBA FLEXTOUCH) 100 UNIT/ML SOPN FlexTouch Pen Inject 0.8 mLs (80 Units total) into the skin daily.  . insulin lispro (HUMALOG KWIKPEN) 100 UNIT/ML KiwkPen 10 units ac bid  . latanoprost (XALATAN) 0.005 % ophthalmic solution Place 1 drop into the left eye at bedtime.   Marland Kitchen lisinopril (PRINIVIL,ZESTRIL) 40 MG tablet Take 1 tablet (40 mg  total) by mouth daily.  . metoprolol succinate (TOPROL-XL) 100 MG 24 hr tablet Take with or immediately following a meal.  . ONE TOUCH ULTRA TEST test strip Use to check BG twice daily  . ONETOUCH DELICA LANCETS 51O MISC Use to check BG twice daily  . Pyridoxine HCl (VITAMIN B6 PO) Take by mouth daily.  . vitamin C (ASCORBIC ACID) 500 MG tablet Take 500 mg by mouth daily.  . vitamin E (VITAMIN E) 1000 UNIT capsule Take 1,000 Units by mouth daily.     Allergies:   Patient has no known allergies.   Social History   Socioeconomic History  . Marital status: Married    Spouse name: Not on file  . Number of children: 2  . Years of education: 6  . Highest education level: 11th grade  Occupational History  . Occupation: maintenance    Employer: [employer]    Comment: Retired  Scientific laboratory technician  . Financial resource strain: Not hard at all  . Food insecurity:    Worry: Never true    Inability: Never true  . Transportation needs:    Medical: No    Non-medical: No  Tobacco Use  . Smoking status: Former Smoker    Packs/day: 2.00    Years: 20.00    Pack years: 40.00    Types: Cigarettes    Last attempt to quit: 07/20/1986    Years since quitting: 31.8  . Smokeless tobacco: Never Used  Substance and Sexual Activity  . Alcohol use: No    Alcohol/week: 0.0 standard drinks  . Drug use: No  . Sexual activity: Yes  Lifestyle  . Physical activity:    Days per week: 3 days    Minutes per session: 30 min  . Stress: Only a little  Relationships  . Social connections:    Talks on phone: More than three times a week    Gets together: More than three times a week    Attends religious service: Never    Active member of club or organization: Yes    Attends meetings of clubs or organizations: More than 4 times per year    Relationship status: Married  Other Topics Concern  . Not on file  Social History Narrative  . Not on file     Family History: The patient's family history includes Cancer  in his brother and mother; Diabetes in his brother; Early death in his sister; Healthy in his son and son; Heart disease in his brother, brother, and mother; Hydrocephalus in his brother; Hypertension in his father; Stroke (age of onset: 43) in his father.  ROS:   Please see the history of present illness.    He complains of unexplained weight gain.  He has low  ambition to be physically active.  All other systems reviewed and are negative.  EKGs/Labs/Other Studies Reviewed:    The following studies were reviewed today: Last echocardiogram 2018.   EKG:  EKG performed today reveals sinus rhythm, right bundle branch block, nonspecific ST-T abnormality.  When compared to prior tracing from October 2018 the tall R wave in V1 is new.   Recent Labs: 08/23/2017: Hemoglobin 12.8; Platelets 186 04/22/2018: ALT 25; BUN 20; Creatinine, Ser 1.93; Potassium 4.5; Sodium 139  Recent Lipid Panel    Component Value Date/Time   CHOL 100 08/23/2017 0850   TRIG 96 08/23/2017 0850   HDL 32 (L) 08/23/2017 0850   CHOLHDL 3.1 08/23/2017 0850   LDLCALC 49 08/23/2017 0850   LDLDIRECT 62 02/16/2017 1151    Physical Exam:    VS:  BP 106/64   Pulse 77   Ht 5' 3"  (1.6 m)   Wt 191 lb 12.8 oz (87 kg)   SpO2 95%   BMI 33.98 kg/m     Wt Readings from Last 3 Encounters:  05/27/18 191 lb 12.8 oz (87 kg)  04/27/18 186 lb (84.4 kg)  03/09/18 186 lb (84.4 kg)     GEN:  Well nourished, well developed in no acute distress HEENT: Normal NECK: No JVD. LYMPHATICS: No lymphadenopathy CARDIAC: RRR, 2/6 right upper sternal border systolic prosthetic aortic valve systolic but no diastolic murmur, no gallop, 2+ bilateral ankle edema..  Small eschar anterior left shin without warmth. VASCULAR: Absent pedal pulses bilaterally.  No palpable popliteal pulses.  Right greater than left femoral bruits. RESPIRATORY:  Clear to auscultation without rales, wheezing or rhonchi  ABDOMEN: Soft, non-tender, non-distended, No  pulsatile mass, MUSCULOSKELETAL: No deformity  SKIN: Warm and dry NEUROLOGIC:  Alert and oriented x 3 PSYCHIATRIC:  Normal affect   ASSESSMENT:    1. S/P aortic valve replacement with tissue   2. Coronary artery disease involving native coronary artery of native heart with angina pectoris ([place])   3. CKD stage 3 secondary to diabetes ([place])   4. Type 2 diabetes mellitus with stage 3 chronic kidney disease, without long-term current use of insulin ([place])   5. Mixed hyperlipidemia   6. Essential hypertension   7. Occlusion of left carotid artery   8. Atherosclerosis of lower extremity with claudication ([place])    PLAN:    In order of problems listed above:  1. Clinically stable bioprosthetic aortic valve.  Patient is clinically stable without symptoms of heart failure and there is no auscultatory evidence of regurgitation. 2. Asymptomatic from the cardiac standpoint with reference to ischemia/angina.  Aggressive secondary risk prevention is discussed. 3. Not addressed. 4. Consider inclusion of SGLT2 and GLP-1 agents to prevent cardiac events. 5. LDL target less than 70.  In February 2019 the LDL was 49. 6. Blood pressure is excellent.  Target 130/80 or less. 7. Noted and understood by patient 8. Evaluate with bilateral lower extremity vascular Doppler/ABI.  He is a prime candidate for critical limb ischemia given multiple risk factors, long-standing diabetes, and prior smoking history.  Clinical follow-up in 6 months.  Overall education and awareness concerning primary/secondary risk prevention was discussed in detail: LDL less than 70, hemoglobin A1c less than 7, blood pressure target less than 130/80 mmHg, >150 minutes of moderate aerobic activity per week, avoidance of smoking, weight control (via diet and exercise), and continued surveillance/management of/for obstructive sleep apnea.  Greater than 50% of the time during this office visit was spent in education, counseling,  and  coordination of care related to underlying disease process and testing as outlined.    Medication Adjustments/Labs and Tests Ordered: Current medicines are reviewed at length with the patient today.  Concerns regarding medicines are outlined above.  No orders of the defined types were placed in this encounter.  No orders of the defined types were placed in this encounter.   There are no Patient Instructions on file for this visit.   Signed, Evan Grooms, MD  05/27/2018 9:50 AM    [place]

## 2018-05-27 ENCOUNTER — Telehealth: Payer: Self-pay | Admitting: Endocrinology

## 2018-05-27 ENCOUNTER — Ambulatory Visit: Payer: Medicare Other | Admitting: Interventional Cardiology

## 2018-05-27 ENCOUNTER — Encounter: Payer: Self-pay | Admitting: Interventional Cardiology

## 2018-05-27 ENCOUNTER — Other Ambulatory Visit: Payer: Self-pay | Admitting: Interventional Cardiology

## 2018-05-27 VITALS — BP 106/64 | HR 77 | Ht 63.0 in | Wt 191.8 lb

## 2018-05-27 DIAGNOSIS — I70219 Atherosclerosis of native arteries of extremities with intermittent claudication, unspecified extremity: Secondary | ICD-10-CM

## 2018-05-27 DIAGNOSIS — E782 Mixed hyperlipidemia: Secondary | ICD-10-CM

## 2018-05-27 DIAGNOSIS — I25119 Atherosclerotic heart disease of native coronary artery with unspecified angina pectoris: Secondary | ICD-10-CM

## 2018-05-27 DIAGNOSIS — N183 Chronic kidney disease, stage 3 (moderate): Secondary | ICD-10-CM

## 2018-05-27 DIAGNOSIS — Z953 Presence of xenogenic heart valve: Secondary | ICD-10-CM

## 2018-05-27 DIAGNOSIS — E1122 Type 2 diabetes mellitus with diabetic chronic kidney disease: Secondary | ICD-10-CM

## 2018-05-27 DIAGNOSIS — I10 Essential (primary) hypertension: Secondary | ICD-10-CM | POA: Diagnosis not present

## 2018-05-27 DIAGNOSIS — I6522 Occlusion and stenosis of left carotid artery: Secondary | ICD-10-CM

## 2018-05-27 NOTE — Telephone Encounter (Signed)
Patient is requesting that his RX for needles be changed to more since his insulin use has been increased.  Pharmacy is [facility]

## 2018-05-27 NOTE — Patient Instructions (Signed)
Medication Instructions:  Your physician recommends that you continue on your current medications as directed. Please refer to the Current Medication list given to you today.  If you need a refill on your cardiac medications before your next appointment, please call your pharmacy.   Lab work: None If you have labs (blood work) drawn today and your tests are completely normal, you will receive your results only by: Marland Kitchen MyChart Message (if you have MyChart) OR . A paper copy in the mail If you have any lab test that is abnormal or we need to change your treatment, we will call you to review the results.  Testing/Procedures: Your physician has requested that you have a lower or upper extremity arterial duplex. This test is an ultrasound of the arteries in the legs or arms. It looks at arterial blood flow in the legs and arms. Allow one hour for Lower and Upper Arterial scans. There are no restrictions or special instructions   Follow-Up: At [facility], you and your health needs are our priority.  As part of our continuing mission to provide you with exceptional heart care, we have created designated Provider Care Teams.  These Care Teams include your primary Cardiologist (physician) and Advanced Practice Providers (APPs -  Physician Assistants and Nurse Practitioners) who all work together to provide you with the care you need, when you need it. You will need a follow up appointment in 6 months.  Please call our office 2 months in advance to schedule this appointment.  You may see Sinclair Grooms, MD or one of the following Advanced Practice Providers on your designated Care Team:   Truitt Merle, NP Cecilie Kicks, NP . Kathyrn Drown, NP  Any Other Special Instructions Will Be Listed Below (If Applicable).

## 2018-05-30 ENCOUNTER — Other Ambulatory Visit: Payer: Self-pay | Admitting: Family Medicine

## 2018-05-30 DIAGNOSIS — I10 Essential (primary) hypertension: Secondary | ICD-10-CM

## 2018-05-30 MED ORDER — ATORVASTATIN CALCIUM 40 MG PO TABS: 40.0000 mg | ORAL_TABLET | Freq: Every day | ORAL | 0 refills | Status: DC

## 2018-05-30 MED ORDER — ONETOUCH ULTRA BLUE VI STRP: ORAL_STRIP | 3 refills | Status: DC

## 2018-05-31 ENCOUNTER — Other Ambulatory Visit: Payer: Self-pay

## 2018-05-31 MED ORDER — INSULIN PEN NEEDLE 31G X 5 MM MISC: 3 refills | Status: DC

## 2018-05-31 NOTE — Telephone Encounter (Signed)
Sent!

## 2018-06-08 ENCOUNTER — Ambulatory Visit (HOSPITAL_COMMUNITY)
Admission: RE | Admit: 2018-06-08 | Discharge: 2018-06-08 | Disposition: A | Discharge status: Home / Self Care | Payer: Medicare Other | Source: Ambulatory Visit | Attending: Internal Medicine | Admitting: Internal Medicine

## 2018-06-08 DIAGNOSIS — I70219 Atherosclerosis of native arteries of extremities with intermittent claudication, unspecified extremity: Secondary | ICD-10-CM | POA: Diagnosis not present

## 2018-06-14 ENCOUNTER — Ambulatory Visit: Payer: Medicare Other | Admitting: Cardiovascular Disease

## 2018-06-14 ENCOUNTER — Encounter: Payer: Self-pay | Admitting: Cardiovascular Disease

## 2018-06-14 VITALS — BP 132/66 | HR 87 | Ht 63.0 in | Wt 191.6 lb

## 2018-06-14 DIAGNOSIS — I10 Essential (primary) hypertension: Secondary | ICD-10-CM | POA: Diagnosis not present

## 2018-06-14 DIAGNOSIS — E782 Mixed hyperlipidemia: Secondary | ICD-10-CM

## 2018-06-14 DIAGNOSIS — I2510 Atherosclerotic heart disease of native coronary artery without angina pectoris: Secondary | ICD-10-CM | POA: Diagnosis not present

## 2018-06-14 DIAGNOSIS — I739 Peripheral vascular disease, unspecified: Secondary | ICD-10-CM

## 2018-06-14 DIAGNOSIS — I779 Disorder of arteries and arterioles, unspecified: Secondary | ICD-10-CM

## 2018-06-14 NOTE — Patient Instructions (Addendum)
Medication Instructions:  Your physician recommends that you continue on your current medications as directed. Please refer to the Current Medication list given to you today.  If you need a refill on your cardiac medications before your next appointment, please call your pharmacy.   Lab work: none If you have labs (blood work) drawn today and your tests are completely normal, you will receive your results only by: Marland Kitchen MyChart Message (if you have MyChart) OR . A paper copy in the mail If you have any lab test that is abnormal or we need to change your treatment, we will call you to review the results.  Testing/Procedures: none  Follow-Up: At [facility], you and your health needs are our priority.  As part of our continuing mission to provide you with exceptional heart care, we have created designated Provider Care Teams.  These Care Teams include your primary Cardiologist (physician) and Advanced Practice Providers (APPs -  Physician Assistants and Nurse Practitioners) who all work together to provide you with the care you need, when you need it. You will need a follow up appointment in 12 months.  Please call our office 2 months in advance to schedule this appointment.  You may see Dr. [name] - PV  or one of the following Advanced Practice Providers on your designated Care Team:   Kerin Ransom, PA-C Roby Lofts, [place] . Sande Rives, PA-C  Any Other Special Instructions Will Be Listed Below (If Applicable).

## 2018-06-14 NOTE — Progress Notes (Signed)
Cardiology Office Note   Date:  06/14/2018   ID:  Evan Stanley, DOB 1940/12/21, MRN [id number]  PCP:  Evan Norlander, DO  Cardiologist: Dr. Tamala Stanley   Chief Complaint  Patient presents with  . Follow-up    doppler      History of Present Illness: Evan Stanley is a 77 y.o. male who was referred by Dr. Tamala Stanley for evaluation and management of peripheral arterial disease.  He has known history of bioprosthetic aortic valve placement, hyperlipidemia, coronary artery disease status post CABG, chronic systolic heart failure, CVA with carotid disease with known totally occluded left ICA and right carotid endarterectomy and type 2 diabetes.  He is not a smoker. He describes bilateral calf discomfort which is equal in both sides and does not happen all the time with physical activities.  He is able to walk throughout [place] without having to stop.  His wife reports that he is not very active at baseline since he had a stroke 2 years ago. He underwent vascular studies which showed an ABI of 0.78 on the right and 1.14 on the left.  Duplex showed short occlusion of the right distal SFA with occluded posterior tibial artery.  On the left, there was moderate SFA disease with occluded anterior tibial artery. The patient has no rest pain or lower extremity ulceration.   Past Medical History:  Diagnosis Date  . AKI (acute kidney injury) ([place])   . Allergy   . Anxiety   . Arthritis    Right shoulder  . Atrial fibrillation ([place])   . Carotid artery stenosis   . Cataract   . CHF (congestive heart failure) ([place])   . CVA (cerebral vascular accident) ([place]) 04/28/2016  . Decreased vision    left eye  . Diabetes mellitus without complication ([name])    Takes Metformin  . Hyperlipidemia   . Hypertension   . Salmonella bacteremia 04/29/2016  . Sepsis ([place]) 04/29/2016  . Stroke ([place]) 11/18/2015   no deficits  . TIA (transient ischemic attack)    affected left eye  . Urgency of urination     Past  Surgical History:  Procedure Laterality Date  . AORTIC VALVE REPLACEMENT    . BACK SURGERY  80s  . CARDIAC VALVE REPLACEMENT  2010  . ENDARTERECTOMY Right 01/13/2016   Procedure: ENDARTERECTOMY CAROTID - RIGHT;  Surgeon: Evan Gilberton, MD;  Location: [facility];  Service: Vascular;  Laterality: Right;  . EYE SURGERY    . laser surgery, left eye  Left 10-29-2015    retinal surgery/ Evan Lair MD   . PERIPHERAL VASCULAR CATHETERIZATION Right 11/18/2015   Procedure: Carotid Angiography;  Surgeon: Evan Cochran, MD;  Location: [facility];  Service: Cardiovascular;  Laterality: Right;  . PERIPHERAL VASCULAR CATHETERIZATION N/A 11/18/2015   Procedure: Aortic Arch Angiography;  Surgeon: Evan [place], MD;  Location: [facility];  Service: Cardiovascular;  Laterality: N/A;  . TEE WITHOUT CARDIOVERSION N/A 05/05/2016   Procedure: TRANSESOPHAGEAL ECHOCARDIOGRAM (TEE);  Surgeon: Evan Perla, MD;  Location: [facility] ENDOSCOPY;  Service: Cardiovascular;  Laterality: N/A;     Current Outpatient Medications  Medication Sig Dispense Refill  . amLODipine (NORVASC) 10 MG tablet TAKE 1 TABLET BY MOUTH  DAILY 90 tablet 0  . ammonium lactate (AMLACTIN) 12 % cream APPLY TO DRY SKIN ON BOTTOM OF FEET 2 TIMES A DAY  4  . aspirin EC 81 MG tablet Take 1 tablet (81 mg total) by mouth  daily. 90 tablet 3  . atorvastatin (LIPITOR) 40 MG tablet Take 1 tablet (40 mg total) by mouth daily. 90 tablet 0  . atropine 1 % ophthalmic solution Place 1 drop into the left eye daily.   6  . cetirizine (ZYRTEC) 10 MG tablet Take 10 mg by mouth daily.    . Dulaglutide (TRULICITY) 1.5 IR/5.1OA SOPN Inject 1 pen weekly 12 pen 4  . hydrochlorothiazide (HYDRODIURIL) 25 MG tablet Take 1 tablet (25 mg total) by mouth daily. 90 tablet 1  . insulin degludec (TRESIBA FLEXTOUCH) 100 UNIT/ML SOPN FlexTouch Pen Inject 0.8 mLs (80 Units total) into the skin daily. 72 mL 1  . insulin lispro (HUMALOG KWIKPEN) 100 UNIT/ML KiwkPen 10 units ac  bid 15 mL 1  . Insulin Pen Needle (EASY TOUCH PEN NEEDLES) 31G X 5 MM MISC USE TWICE DAILY TO INJECT INSULIN 180 each 3  . latanoprost (XALATAN) 0.005 % ophthalmic solution Place 1 drop into the left eye at bedtime.     Marland Kitchen lisinopril (PRINIVIL,ZESTRIL) 40 MG tablet Take 1 tablet (40 mg total) by mouth daily. 90 tablet 3  . metoprolol succinate (TOPROL-XL) 100 MG 24 hr tablet TAKE 1 TABLET BY MOUTH  DAILY WITH OR IMMEDIATELY  FOLLOWING A MEAL. 90 tablet 0  . ONE TOUCH ULTRA TEST test strip Use to check BG twice daily 200 each 3  . ONETOUCH DELICA LANCETS 41Y MISC Use to check BG twice daily 100 each 5  . Pyridoxine HCl (VITAMIN B6 PO) Take by mouth daily.    . vitamin C (ASCORBIC ACID) 500 MG tablet Take 500 mg by mouth daily.    . vitamin E (VITAMIN E) 1000 UNIT capsule Take 1,000 Units by mouth daily.     No current facility-administered medications for this visit.     Allergies:   Patient has no known allergies.    Social History:  The patient  reports that he quit smoking about 31 years ago. His smoking use included cigarettes. He has a 40.00 pack-year smoking history. He has never used smokeless tobacco. He reports that he does not drink alcohol or use drugs.   Family History:  The patient's family history includes Cancer in his brother and mother; Diabetes in his brother; Early death in his sister; Healthy in his son and son; Heart disease in his brother, brother, and mother; Hydrocephalus in his brother; Hypertension in his father; Stroke (age of onset: 92) in his father.    ROS:  Please see the history of present illness.   Otherwise, review of systems are positive for none.   All other systems are reviewed and negative.    PHYSICAL EXAM: VS:  BP 132/66   Pulse 87   Ht 5' 3"  (1.6 m)   Wt 191 lb 9.6 oz (86.9 kg)   SpO2 97%   BMI 33.94 kg/m  , BMI Body mass index is 33.94 kg/m. GEN: Well nourished, well developed, in no acute distress  HEENT: normal  Neck: no JVD, carotid  bruits, or masses Cardiac: RRR; no rubs, or gallops,no edema .  2 out of 6 systolic murmur in the aortic area Respiratory:  clear to auscultation bilaterally, normal work of breathing GI: soft, nontender, nondistended, + BS MS: no deformity or atrophy  Skin: warm and dry, no rash Neuro:  Strength and sensation are intact Psych: euthymic mood, full affect Vascular: Femoral pulses normal bilaterally.  Distal pulses are not palpable.  EKG:  EKG is not ordered today.  Recent Labs: 08/23/2017: Hemoglobin 12.8; Platelets 186 04/22/2018: ALT 25; BUN 20; Creatinine, Ser 1.93; Potassium 4.5; Sodium 139    Lipid Panel    Component Value Date/Time   CHOL 100 08/23/2017 0850   TRIG 96 08/23/2017 0850   HDL 32 (L) 08/23/2017 0850   CHOLHDL 3.1 08/23/2017 0850   LDLCALC 49 08/23/2017 0850   LDLDIRECT 62 02/16/2017 1151      Wt Readings from Last 3 Encounters:  06/14/18 191 lb 9.6 oz (86.9 kg)  05/27/18 191 lb 12.8 oz (87 kg)  04/27/18 186 lb (84.4 kg)        No flowsheet data found.    ASSESSMENT AND PLAN:  1.  Peripheral arterial disease: Currently with mild to moderate calf claudication which does not seem to be lifestyle limiting.  I discussed with him the natural history and management of peripheral arterial disease.  His symptoms at the present time do not seem to be lifestyle limiting and his functional capacity is overall reduced from other reasons.  I do not think he would benefit from revascularization at the present time. I recommend continuing medical therapy and I discussed with him the importance of a walking program. If symptoms worsen in the future, angiography can be considered. Given the presence of coronary artery disease and peripheral arterial disease, we should consider adding a small dose Xarelto.  2.  Hyperlipidemia: Continue treatment with atorvastatin with a target LDL of less than 70.  3.  Essential hypertension: Blood pressure is reasonably controlled on  current medications.  4.  Status post CABG and aortic valve replacement: Seems to be stable.  5.  Carotid disease status post right carotid endarterectomy with occluded left carotid artery.  This is followed by V VS    Disposition:   FU with me in 1 year  Signed,  Kathlyn [place], MD  06/14/2018 11:23 AM    [place]

## 2018-06-23 DIAGNOSIS — M79676 Pain in unspecified toe(s): Secondary | ICD-10-CM | POA: Diagnosis not present

## 2018-06-23 DIAGNOSIS — B351 Tinea unguium: Secondary | ICD-10-CM | POA: Diagnosis not present

## 2018-06-23 DIAGNOSIS — L84 Corns and callosities: Secondary | ICD-10-CM | POA: Diagnosis not present

## 2018-06-23 DIAGNOSIS — E1151 Type 2 diabetes mellitus with diabetic peripheral angiopathy without gangrene: Secondary | ICD-10-CM | POA: Diagnosis not present

## 2018-06-30 ENCOUNTER — Encounter: Payer: Self-pay | Admitting: Family Medicine

## 2018-06-30 ENCOUNTER — Ambulatory Visit (INDEPENDENT_AMBULATORY_CARE_PROVIDER_SITE_OTHER): Payer: Medicare Other | Admitting: Family Medicine

## 2018-06-30 VITALS — BP 123/77 | HR 84 | Temp 97.7°F | Ht 63.0 in | Wt 190.0 lb

## 2018-06-30 DIAGNOSIS — E1122 Type 2 diabetes mellitus with diabetic chronic kidney disease: Secondary | ICD-10-CM

## 2018-06-30 DIAGNOSIS — N183 Chronic kidney disease, stage 3 unspecified: Secondary | ICD-10-CM

## 2018-06-30 DIAGNOSIS — I10 Essential (primary) hypertension: Secondary | ICD-10-CM | POA: Diagnosis not present

## 2018-06-30 DIAGNOSIS — E785 Hyperlipidemia, unspecified: Secondary | ICD-10-CM

## 2018-06-30 DIAGNOSIS — E1159 Type 2 diabetes mellitus with other circulatory complications: Secondary | ICD-10-CM | POA: Diagnosis not present

## 2018-06-30 DIAGNOSIS — E1169 Type 2 diabetes mellitus with other specified complication: Secondary | ICD-10-CM

## 2018-06-30 DIAGNOSIS — I152 Hypertension secondary to endocrine disorders: Secondary | ICD-10-CM

## 2018-06-30 MED ORDER — ATORVASTATIN CALCIUM 40 MG PO TABS: 40.0000 mg | ORAL_TABLET | Freq: Every day | ORAL | 3 refills | Status: DC

## 2018-06-30 MED ORDER — METOPROLOL SUCCINATE ER 100 MG PO TB24: ORAL_TABLET | ORAL | 3 refills | Status: DC

## 2018-06-30 MED ORDER — LISINOPRIL 40 MG PO TABS: 40.0000 mg | ORAL_TABLET | Freq: Every day | ORAL | 3 refills | Status: DC

## 2018-06-30 MED ORDER — AMLODIPINE BESYLATE 10 MG PO TABS: 10.0000 mg | ORAL_TABLET | Freq: Every day | ORAL | 3 refills | Status: DC

## 2018-06-30 NOTE — Progress Notes (Signed)
Subjective: CC: DM2, HTN, HLD PCP: [name], DO [id number] Evan Stanley is a 77 y.o. male presenting to clinic today for:  1. Type 2 Diabetes with hypertension, hyperlipidemia, CAD and CKD 3:  Patient reports: High at home: 200s; Low at home: 112, Taking medication(s): Tresiba 80 units, Trulicity 1.5 and now meal time insulin (humalog) that was recently initiated by Dr Dwyane Dee, endocrinologist.  He reports compliance with HCTZ, Lisinopril, Norvasc and Metoprolol. Side effects: None.  No hypoglycemic episodes.  Last eye exam: Sees Dr. Zadie Rhine.  He sees Dr. Carolynn Sayers as well every year. He has an appt 12/23. Last foot exam: Up-to-date Last A1c: 11.4 02/2018;  Seeing endocrinology, Dr Dwyane Dee on 07/06/18 Nephropathy screen indicated?: on ACE-I Last flu, zoster and/or pneumovax: UTD  ROS: Denies dizziness, LOC, polydipsia, unintended weight loss/gain, foot ulcerations, numbness or tingling in extremities or chest pain.  No SOB or change in exercise tolerance.  Urine output is good.   ROS: Per HPI  No Known Allergies Past Medical History:  Diagnosis Date  . AKI (acute kidney injury) ([place])   . Allergy   . Anxiety   . Arthritis    Right shoulder  . Atrial fibrillation ([place])   . Benign essential HTN   . Carotid artery stenosis   . Cataract   . CHF (congestive heart failure) ([place])   . CVA (cerebral vascular accident) ([place]) 04/28/2016  . Decreased vision    left eye  . Diabetes mellitus without complication ([place])    Takes Metformin  . Hyperlipidemia   . Hypertension   . Salmonella bacteremia 04/29/2016  . Sepsis ([place]) 04/29/2016  . Stroke ([place]) 11/18/2015   no deficits  . TIA (transient ischemic attack)    affected left eye  . Urgency of urination     Current Outpatient Medications:  .  amLODipine (NORVASC) 10 MG tablet, TAKE 1 TABLET BY MOUTH  DAILY, Disp: 90 tablet, Rfl: 0 .  ammonium lactate (AMLACTIN) 12 % cream, APPLY TO DRY SKIN ON BOTTOM OF FEET 2 TIMES A DAY, Disp: , Rfl:  4 .  aspirin EC 81 MG tablet, Take 1 tablet (81 mg total) by mouth daily., Disp: 90 tablet, Rfl: 3 .  atorvastatin (LIPITOR) 40 MG tablet, Take 1 tablet (40 mg total) by mouth daily., Disp: 90 tablet, Rfl: 0 .  atropine 1 % ophthalmic solution, Place 1 drop into the left eye daily. , Disp: , Rfl: 6 .  cetirizine (ZYRTEC) 10 MG tablet, Take 10 mg by mouth daily., Disp: , Rfl:  .  Dulaglutide (TRULICITY) 1.5 TU/8.8KC SOPN, Inject 1 pen weekly, Disp: 12 pen, Rfl: 4 .  hydrochlorothiazide (HYDRODIURIL) 25 MG tablet, Take 1 tablet (25 mg total) by mouth daily., Disp: 90 tablet, Rfl: 1 .  insulin degludec (TRESIBA FLEXTOUCH) 100 UNIT/ML SOPN FlexTouch Pen, Inject 0.8 mLs (80 Units total) into the skin daily., Disp: 72 mL, Rfl: 1 .  insulin lispro (HUMALOG KWIKPEN) 100 UNIT/ML KiwkPen, 10 units ac bid, Disp: 15 mL, Rfl: 1 .  Insulin Pen Needle (EASY TOUCH PEN NEEDLES) 31G X 5 MM MISC, USE TWICE DAILY TO INJECT INSULIN, Disp: 180 each, Rfl: 3 .  latanoprost (XALATAN) 0.005 % ophthalmic solution, Place 1 drop into the left eye at bedtime. , Disp: , Rfl:  .  lisinopril (PRINIVIL,ZESTRIL) 40 MG tablet, Take 1 tablet (40 mg total) by mouth daily., Disp: 90 tablet, Rfl: 3 .  metoprolol succinate (TOPROL-XL) 100 MG 24 hr tablet, TAKE 1 TABLET BY MOUTH  DAILY WITH OR IMMEDIATELY  FOLLOWING A MEAL., Disp: 90 tablet, Rfl: 0 .  ONE TOUCH ULTRA TEST test strip, Use to check BG twice daily, Disp: 200 each, Rfl: 3 .  ONETOUCH DELICA LANCETS 68L MISC, Use to check BG twice daily, Disp: 100 each, Rfl: 5 .  Pyridoxine HCl (VITAMIN B6 PO), Take by mouth daily., Disp: , Rfl:  .  vitamin C (ASCORBIC ACID) 500 MG tablet, Take 500 mg by mouth daily., Disp: , Rfl:  .  vitamin E (VITAMIN E) 1000 UNIT capsule, Take 1,000 Units by mouth daily., Disp: , Rfl:  Social History   Socioeconomic History  . Marital status: Married    Spouse name: Not on file  . Number of children: 2  . Years of education: 47  . Highest education  level: 11th grade  Occupational History  . Occupation: maintenance    Employer: [employer]    Comment: Retired  Scientific laboratory technician  . Financial resource strain: Not hard at all  . Food insecurity:    Worry: Never true    Inability: Never true  . Transportation needs:    Medical: No    Non-medical: No  Tobacco Use  . Smoking status: Former Smoker    Packs/day: 2.00    Years: 20.00    Pack years: 40.00    Types: Cigarettes    Last attempt to quit: 07/20/1986    Years since quitting: 31.9  . Smokeless tobacco: Never Used  Substance and Sexual Activity  . Alcohol use: No    Alcohol/week: 0.0 standard drinks  . Drug use: No  . Sexual activity: Yes  Lifestyle  . Physical activity:    Days per week: 3 days    Minutes per session: 30 min  . Stress: Only a little  Relationships  . Social connections:    Talks on phone: More than three times a week    Gets together: More than three times a week    Attends religious service: Never    Active member of club or organization: Yes    Attends meetings of clubs or organizations: More than 4 times per year    Relationship status: Married  . Intimate partner violence:    Fear of current or ex partner: No    Emotionally abused: No    Physically abused: No    Forced sexual activity: No  Other Topics Concern  . Not on file  Social History Narrative  . Not on file   Family History  Problem Relation Age of Onset  . Heart disease Mother   . Cancer Mother        breast  . Stroke Father 81  . Hypertension Father   . Early death Sister        infant death  . Heart disease Brother   . Hydrocephalus Brother   . Heart disease Brother   . Diabetes Brother   . Cancer Brother        prostat  . Healthy Son   . Healthy Son     Objective: Office vital signs reviewed. BP 123/77   Pulse 84   Temp 97.7 F (36.5 C) (Oral)   Ht 5' 3"  (1.6 m)   Wt 190 lb (86.2 kg)   BMI 33.66 kg/m   Physical Examination:  General: Awake, alert, well  nourished, No acute distress HEENT: sclera white, MMM Cardio: regular rate and rhythm, S1S2 heard, 2/6 SEM at b/s sternal borders Pulm: clear to auscultation bilaterally, no  wheezes, rhonchi or rales; normal work of breathing on room air Extremities: warm, well perfused, No edema, cyanosis or clubbing; +2 pulses bilaterally  Assessment/ Plan: 77 y.o. male   1. Hypertension associated with diabetes ([place]) Under excellent control on current regimen.  No changes made.  I have sent refills on his requested medications.  He will follow-up in 4 months for fasting labs. - amLODipine (NORVASC) 10 MG tablet; Take 1 tablet (10 mg total) by mouth daily.  Dispense: 90 tablet; Refill: 3 - lisinopril (PRINIVIL,ZESTRIL) 40 MG tablet; Take 1 tablet (40 mg total) by mouth daily.  Dispense: 90 tablet; Refill: 3 - metoprolol succinate (TOPROL-XL) 100 MG 24 hr tablet; TAKE 1 TABLET BY MOUTH  DAILY WITH OR IMMEDIATELY  FOLLOWING A MEAL.  Dispense: 90 tablet; Refill: 3  2. CKD stage 3 secondary to diabetes [facility]) Relatively stable in October.  He has a CMP scheduled with endocrinology next week.  Refill lisinopril.  We will continue to monitor. - lisinopril (PRINIVIL,ZESTRIL) 40 MG tablet; Take 1 tablet (40 mg total) by mouth daily.  Dispense: 90 tablet; Refill: 3  3. Hyperlipidemia associated w/ type 2 DM goal <70 Plan for fasting lipid panel at next visit. - atorvastatin (LIPITOR) 40 MG tablet; Take 1 tablet (40 mg total) by mouth daily.  Dispense: 90 tablet; Refill: 3  4. Type 2 diabetes mellitus with stage 3 chronic kidney disease, without long-term current use of insulin (HCC) Seems to be improving with addition of mealtime insulin.  He continues to have elevated blood sugars above goal.  I suspect that endocrinology will continue to titrate insulin based on sugar levels.  Will defer to endocrinology for ongoing management of diabetic medications.  Patient has eye appointment scheduled for the end of the month.   He will have records sent to the office.   No orders of the defined types were placed in this encounter.  Meds ordered this encounter  Medications  . amLODipine (NORVASC) 10 MG tablet    Sig: Take 1 tablet (10 mg total) by mouth daily.    Dispense:  90 tablet    Refill:  3  . atorvastatin (LIPITOR) 40 MG tablet    Sig: Take 1 tablet (40 mg total) by mouth daily.    Dispense:  90 tablet    Refill:  3  . lisinopril (PRINIVIL,ZESTRIL) 40 MG tablet    Sig: Take 1 tablet (40 mg total) by mouth daily.    Dispense:  90 tablet    Refill:  3  . metoprolol succinate (TOPROL-XL) 100 MG 24 hr tablet    Sig: TAKE 1 TABLET BY MOUTH  DAILY WITH OR IMMEDIATELY  FOLLOWING A MEAL.    Dispense:  90 tablet    Refill:  [place], [place] [phone]

## 2018-06-30 NOTE — Patient Instructions (Signed)
Blood pressure looks great.  Next visit in 4 months, plan to fast for labs.  We will check your cholesterol at that visit.  Keep appointment with your eye doctor and with Dr Dwyane Dee.

## 2018-07-06 ENCOUNTER — Encounter: Payer: Self-pay | Admitting: Endocrinology

## 2018-07-06 ENCOUNTER — Ambulatory Visit: Payer: Medicare Other | Admitting: Endocrinology

## 2018-07-06 VITALS — BP 132/80 | HR 72 | Ht 63.0 in | Wt 190.2 lb

## 2018-07-06 DIAGNOSIS — Z794 Long term (current) use of insulin: Secondary | ICD-10-CM

## 2018-07-06 DIAGNOSIS — E1165 Type 2 diabetes mellitus with hyperglycemia: Secondary | ICD-10-CM

## 2018-07-06 LAB — POCT GLYCOSYLATED HEMOGLOBIN (HGB A1C): Hemoglobin A1C: 8.1 % — AB (ref 4.0–5.6)

## 2018-07-06 NOTE — Progress Notes (Signed)
Patient ID: Evan Stanley, male   DOB: 1941-01-27, 77 y.o.   MRN: [id number]          Reason for Appointment: Follow-up for Type 2 Diabetes  Referring physician: Kenn File   History of Present Illness:          Date of diagnosis of type 2 diabetes mellitus: 2010       Background history:   He thinks he was diagnosed to have diabetes after his coronary bypass surgery and blood sugars are not very high He had been on metformin until about 05/2017 Also at some point had taken glimepiride Usually appears to have had A1c just above 7%  Recent history:   INSULIN regimen is: [place] 80  units daily, Humalog 10 units at dinner     Non-insulin hypoglycemic drugs the patient is taking are: Trulicity 1.5 mg weekly  Current management, blood sugar patterns and problems identified:  He was advised to take Humalog on his last visit to control postprandial readings  His blood sugars are overall better but extremely variable in the evening  He now says that he is not taking his Humalog when he is eating out which is about half the time in the evening  Although he is mostly is not eating lunch do not know what his blood sugars are after breakfast when he is eating cereal  He still does not consistently watch his diet when eating out and also still eating some sweets like ice cream  Has not lost any weight  Not motivated to exercise much        Side effects from medications have been: None  Glucose monitoring:  done about 2 times a day         Glucometer: One Touch.       Blood Glucose readings by review of home monitor download show   PRE-MEAL  morning Lunch Dinner Bedtime Overall  Glucose range:  111-234    102-265   Mean/median:  162    189  170     Self-care: The diet that the patient has been following is: tries to limit sweets.     Meal times are:  Breakfast is at after 9 AM  Typical meal intake: Breakfast is cheerios mostly            Dietician visit, most recent:  2019               Exercise:  Rarely playing golf  Weight history:  Wt Readings from Last 3 Encounters:  07/06/18 190 lb 3.2 oz (86.3 kg)  06/30/18 190 lb (86.2 kg)  06/14/18 191 lb 9.6 oz (86.9 kg)    Glycemic control:   Lab Results  Component Value Date   HGBA1C 12.1 (A) 02/04/2018   HGBA1C 9.9 08/23/2017   HGBA1C 7.2 (H) 01/10/2016   Lab Results  Component Value Date   MICROALBUR neg 11/28/2014   LDLCALC 49 08/23/2017   CREATININE 1.93 (H) 04/22/2018   No results found for: MICRALBCREAT  Lab Results  Component Value Date   FRUCTOSAMINE 323 (H) 04/22/2018      Allergies as of 07/06/2018   No Known Allergies     Medication List       Accurate as of July 06, 2018  1:38 PM. Always use your most recent med list.        amLODipine 10 MG tablet Commonly known as:  NORVASC Take 1 tablet (10 mg total) by mouth daily.   ammonium  lactate 12 % cream Commonly known as:  AMLACTIN APPLY TO DRY SKIN ON BOTTOM OF FEET 2 TIMES A DAY   aspirin EC 81 MG tablet Take 1 tablet (81 mg total) by mouth daily.   atorvastatin 40 MG tablet Commonly known as:  LIPITOR Take 1 tablet (40 mg total) by mouth daily.   atropine 1 % ophthalmic solution Place 1 drop into the left eye daily.   cetirizine 10 MG tablet Commonly known as:  ZYRTEC Take 10 mg by mouth daily.   hydrochlorothiazide 25 MG tablet Commonly known as:  HYDRODIURIL Take 1 tablet (25 mg total) by mouth daily.   insulin degludec 100 UNIT/ML Sopn FlexTouch Pen Commonly known as:  TRESIBA FLEXTOUCH Inject 0.8 mLs (80 Units total) into the skin daily.   insulin lispro 100 UNIT/ML KiwkPen Commonly known as:  HUMALOG KWIKPEN 10 units ac bid   Insulin Pen Needle 31G X 5 MM Misc Commonly known as:  EASY TOUCH PEN NEEDLES USE TWICE DAILY TO INJECT INSULIN   latanoprost 0.005 % ophthalmic solution Commonly known as:  XALATAN Place 1 drop into the left eye at bedtime.   lisinopril 40 MG  tablet Commonly known as:  PRINIVIL,ZESTRIL Take 1 tablet (40 mg total) by mouth daily.   metoprolol succinate 100 MG 24 hr tablet Commonly known as:  TOPROL-XL TAKE 1 TABLET BY MOUTH  DAILY WITH OR IMMEDIATELY  FOLLOWING A MEAL.   ONE TOUCH ULTRA TEST test strip Generic drug:  glucose blood Use to check BG twice daily   ONETOUCH DELICA LANCETS 67H Misc Use to check BG twice daily   VITAMIN B6 PO Take by mouth daily.   vitamin C 500 MG tablet Commonly known as:  ASCORBIC ACID Take 500 mg by mouth daily.   vitamin E 1000 UNIT capsule Generic drug:  vitamin E Take 1,000 Units by mouth daily.       Allergies: No Known Allergies  Past Medical History:  Diagnosis Date  . AKI (acute kidney injury) ([place])   . Allergy   . Anxiety   . Arthritis    Right shoulder  . Atrial fibrillation ([place])   . Benign essential HTN   . Carotid artery stenosis   . Cataract   . CHF (congestive heart failure) ([place])   . CVA (cerebral vascular accident) ([place]) 04/28/2016  . Decreased vision    left eye  . Diabetes mellitus without complication ([place])    Takes Metformin  . Hyperlipidemia   . Hypertension   . Salmonella bacteremia 04/29/2016  . Sepsis ([place]) 04/29/2016  . Stroke ([place]) 11/18/2015   no deficits  . TIA (transient ischemic attack)    affected left eye  . Urgency of urination     Past Surgical History:  Procedure Laterality Date  . AORTIC VALVE REPLACEMENT    . BACK SURGERY  80s  . CARDIAC VALVE REPLACEMENT  2010  . ENDARTERECTOMY Right 01/13/2016   Procedure: ENDARTERECTOMY CAROTID - RIGHT;  Surgeon: Conrad Drumright, MD;  Location: [facility];  Service: Vascular;  Laterality: Right;  . EYE SURGERY    . laser surgery, left eye  Left 10-29-2015    retinal surgery/ Deloria Lair MD   . PERIPHERAL VASCULAR CATHETERIZATION Right 11/18/2015   Procedure: Carotid Angiography;  Surgeon: Conrad St. Martin, MD;  Location: [facility] CV [facility];  Service: Cardiovascular;  Laterality: Right;  . PERIPHERAL  VASCULAR CATHETERIZATION N/A 11/18/2015   Procedure: Aortic Arch Angiography;  Surgeon: Conrad Tillamook, MD;  Location: [facility];  Service: Cardiovascular;  Laterality: N/A;  . TEE WITHOUT CARDIOVERSION N/A 05/05/2016   Procedure: TRANSESOPHAGEAL ECHOCARDIOGRAM (TEE);  Surgeon: Lelon Perla, MD;  Location: [facility];  Service: Cardiovascular;  Laterality: N/A;    Family History  Problem Relation Age of Onset  . Heart disease Mother   . Cancer Mother        breast  . Stroke Father 77  . Hypertension Father   . Early death Sister        infant death  . Heart disease Brother   . Hydrocephalus Brother   . Heart disease Brother   . Diabetes Brother   . Cancer Brother        prostat  . Healthy Son   . Healthy Son     Social History:  reports that he quit smoking about 31 years ago. His smoking use included cigarettes. He has a 40.00 pack-year smoking history. He has never used smokeless tobacco. He reports that he does not drink alcohol or use drugs.   Review of Systems   Lipid history: Currently on atorvastatin 40 mg daily prescribed by his PCP    Lab Results  Component Value Date   CHOL 100 08/23/2017   HDL 32 (L) 08/23/2017   LDLCALC 49 08/23/2017   LDLDIRECT 62 02/16/2017   TRIG 96 08/23/2017   CHOLHDL 3.1 08/23/2017           Hypertension:  On multiple drugs including hydrochlorothiazide 25 mg and lisinopril 40 mg daily, Norvasc 10 mg daily prescribed by PCP  BP Readings from Last 3 Encounters:  07/06/18 132/80  06/30/18 123/77  06/14/18 132/66    Most recent eye exam was in 7/18  Most recent foot exam: 7/19    LABS:  No visits with results within 1 Week(s) from this visit.  Latest known visit with results is:  Lab on 04/22/2018  Component Date Value Ref Range Status  . Fructosamine 04/22/2018 323* 0 - 285 umol/L Final   Comment: Published reference interval for apparently healthy subjects between age 24 and 9 is 34 - 285 umol/L and in a  poorly controlled diabetic population is 228 - 563 umol/L with a mean of 396 umol/L.   Marland Kitchen Sodium 04/22/2018 139  135 - 145 mEq/L Final  . Potassium 04/22/2018 4.5  3.5 - 5.1 mEq/L Final  . Chloride 04/22/2018 103  96 - 112 mEq/L Final  . CO2 04/22/2018 28  19 - 32 mEq/L Final  . Glucose, Bld 04/22/2018 187* 70 - 99 mg/dL Final  . BUN 04/22/2018 20  6 - 23 mg/dL Final  . Creatinine, Ser 04/22/2018 1.93* 0.40 - 1.50 mg/dL Final  . Total Bilirubin 04/22/2018 0.5  0.2 - 1.2 mg/dL Final  . Alkaline Phosphatase 04/22/2018 84  39 - 117 U/L Final  . AST 04/22/2018 19  0 - 37 U/L Final  . ALT 04/22/2018 25  0 - 53 U/L Final  . Total Protein 04/22/2018 6.8  6.0 - 8.3 g/dL Final  . Albumin 04/22/2018 4.0  3.5 - 5.2 g/dL Final  . Calcium 04/22/2018 9.6  8.4 - 10.5 mg/dL Final  . GFR 04/22/2018 36.23* >60.00 mL/min Final    Physical Examination:  BP 132/80 (BP Location: Left Arm, Patient Position: Sitting, Cuff Size: Normal)   Pulse 72   Ht 5' 3"  (1.6 m)   Wt 190 lb 3.2 oz (86.3 kg)   SpO2 98%   BMI 33.69 kg/m  ASSESSMENT:  Diabetes type 2, uncontrolled on insulin  See history of present illness for detailed discussion of current diabetes management, blood sugar patterns and problems identified  His most recent A1c is 8.1 compared to 11.4 previously  Although he is still having variable fasting blood sugars overall his glucose is better This is improved with adding Humalog at suppertime He may be also doing a little better with his diet He has difficulty being consistent with diet and exercise regimen Also he is very reluctant to take Humalog with him when he is eating out despite explaining to him that it cannot be taken more than 15 minutes after the meal   HYPERTENSION: Well controlled   PLAN:    He will need to keep his Humalog pen with him when he is eating out and take it right before eating  If he is eating relatively high fat meal or dessert he will take 14  units  Also his wife was present and explained to her that he needs to check his sugars at least every other day after breakfast instead of early morning and if these readings are over 200 he will add 6 units of Humalog at breakfast also  Continue same dose of Tresiba since some mornings his blood sugars are near normal  Walk regularly for exercise  Patient Instructions  Take Humalog with u when eating out  If large meals or sweet  need 14 units  If sugar is over 200 that means you need at least 6 units of Humalog at breakfast also  Walk daily     [name] 07/06/2018, 1:38 PM   Note: This office note was prepared with Dragon voice recognition system technology. Any transcriptional errors that result from this process are unintentional.

## 2018-07-06 NOTE — Patient Instructions (Addendum)
Take Humalog with u when eating out  If large meals or sweet  need 14 units  If sugar is over 200 that means you need at least 6 units of Humalog at breakfast also  Walk daily

## 2018-07-08 ENCOUNTER — Other Ambulatory Visit: Payer: Self-pay

## 2018-07-08 MED ORDER — INSULIN LISPRO (1 UNIT DIAL) 100 UNIT/ML (KWIKPEN): 10.0000 [IU] | PEN_INJECTOR | Freq: Three times a day (TID) | SUBCUTANEOUS | 3 refills | Status: DC

## 2018-07-11 DIAGNOSIS — H401123 Primary open-angle glaucoma, left eye, severe stage: Secondary | ICD-10-CM | POA: Diagnosis not present

## 2018-07-11 DIAGNOSIS — Z961 Presence of intraocular lens: Secondary | ICD-10-CM | POA: Diagnosis not present

## 2018-07-11 DIAGNOSIS — H2511 Age-related nuclear cataract, right eye: Secondary | ICD-10-CM | POA: Diagnosis not present

## 2018-07-11 LAB — HM DIABETES EYE EXAM

## 2018-07-31 ENCOUNTER — Other Ambulatory Visit: Payer: Self-pay | Admitting: Endocrinology

## 2018-09-15 DIAGNOSIS — E1151 Type 2 diabetes mellitus with diabetic peripheral angiopathy without gangrene: Secondary | ICD-10-CM | POA: Diagnosis not present

## 2018-09-15 DIAGNOSIS — B351 Tinea unguium: Secondary | ICD-10-CM | POA: Diagnosis not present

## 2018-09-15 DIAGNOSIS — M79676 Pain in unspecified toe(s): Secondary | ICD-10-CM | POA: Diagnosis not present

## 2018-09-15 DIAGNOSIS — L84 Corns and callosities: Secondary | ICD-10-CM | POA: Diagnosis not present

## 2018-10-03 ENCOUNTER — Other Ambulatory Visit: Payer: Self-pay

## 2018-10-03 ENCOUNTER — Encounter: Payer: Self-pay | Admitting: Endocrinology

## 2018-10-03 ENCOUNTER — Ambulatory Visit: Payer: Medicare Other | Admitting: Endocrinology

## 2018-10-03 ENCOUNTER — Telehealth: Payer: Self-pay | Admitting: Endocrinology

## 2018-10-03 VITALS — BP 122/62 | HR 76 | Temp 97.6°F | Resp 12 | Ht 63.0 in | Wt 192.0 lb

## 2018-10-03 DIAGNOSIS — Z794 Long term (current) use of insulin: Secondary | ICD-10-CM | POA: Diagnosis not present

## 2018-10-03 DIAGNOSIS — E782 Mixed hyperlipidemia: Secondary | ICD-10-CM | POA: Diagnosis not present

## 2018-10-03 DIAGNOSIS — E1165 Type 2 diabetes mellitus with hyperglycemia: Secondary | ICD-10-CM

## 2018-10-03 DIAGNOSIS — N184 Chronic kidney disease, stage 4 (severe): Secondary | ICD-10-CM | POA: Diagnosis not present

## 2018-10-03 LAB — COMPREHENSIVE METABOLIC PANEL
ALT: 27 U/L (ref 0–53)
AST: 16 U/L (ref 0–37)
Albumin: 4.1 g/dL (ref 3.5–5.2)
Alkaline Phosphatase: 81 U/L (ref 39–117)
BUN: 28 mg/dL — ABNORMAL HIGH (ref 6–23)
CALCIUM: 9.3 mg/dL (ref 8.4–10.5)
CO2: 27 meq/L (ref 19–32)
Chloride: 103 mEq/L (ref 96–112)
Creatinine, Ser: 1.96 mg/dL — ABNORMAL HIGH (ref 0.40–1.50)
GFR: 33.44 mL/min — ABNORMAL LOW (ref >60.00)
Glucose, Bld: 171 mg/dL — ABNORMAL HIGH (ref 70–99)
Potassium: 4.1 mEq/L (ref 3.5–5.1)
Sodium: 137 mEq/L (ref 135–145)
Total Bilirubin: 0.5 mg/dL (ref 0.2–1.2)
Total Protein: 6.7 g/dL (ref 6.0–8.3)

## 2018-10-03 LAB — POCT GLYCOSYLATED HEMOGLOBIN (HGB A1C): Hemoglobin A1C: 8 % — AB (ref 4.0–5.6)

## 2018-10-03 LAB — LIPID PANEL
CHOL/HDL RATIO: 3
Cholesterol: 86 mg/dL (ref 0–200)
HDL: 29.9 mg/dL — ABNORMAL LOW (ref >39.00)
LDL Cholesterol: 36 mg/dL (ref 0–99)
NonHDL: 55.81
Triglycerides: 97 mg/dL (ref 0.0–149.0)
VLDL: 19.4 mg/dL (ref 0.0–40.0)

## 2018-10-03 LAB — GLUCOSE, POCT (MANUAL RESULT ENTRY): POC Glucose: 198 mg/dl — AB (ref 70–99)

## 2018-10-03 MED ORDER — INSULIN DEGLUDEC 200 UNIT/ML ~~LOC~~ SOPN: 80.0000 [IU] | PEN_INJECTOR | Freq: Every day | SUBCUTANEOUS | 1 refills | Status: DC

## 2018-10-03 NOTE — Telephone Encounter (Signed)
Patient's wife called re: patient was seen earlier today and she noticed that Trulicity is not on the patient's medication list and it should be.

## 2018-10-03 NOTE — Telephone Encounter (Signed)
Medication added to pt's med list.

## 2018-10-03 NOTE — Progress Notes (Signed)
Patient ID: Evan Stanley, male   DOB: [birth year], 78 y.o.   MRN: [id number]          Reason for Appointment: Follow-up for Type 2 Diabetes  Referring physician: Kenn File   History of Present Illness:          Date of diagnosis of type 2 diabetes mellitus: 2010       Background history:   He thinks he was diagnosed to have diabetes after his coronary bypass surgery and blood sugars are not very high He had been on metformin until about 05/2017 Also at some point had taken glimepiride Usually appears to have had A1c just above 7%  Recent history:   INSULIN regimen is: [place] 80  units daily, Humalog 10 units at dinner     Non-insulin hypoglycemic drugs the patient is taking are: Trulicity 1.5 mg weekly  Current management, blood sugar patterns and problems identified:  He was advised to take Humalog BEFORE breakfast also based on his blood sugar on his last visit to control postprandial readings but he has not done so and has not checked readings after breakfast also  His blood sugars are overall better in the mornings but not consistently improved in the evenings  And his weight has gone up 2 pounds  Today after eating cereal at breakfast his blood sugar is nearly 200  Otherwise he may have breakfast with only eggs and a meat without carbohydrate  As before he has not checked his blood sugar after lunch, sometimes he will have carbohydrates such as hot dogs and lunch but otherwise trying to eat grilled chicken breast and no carbohydrate  Sometimes he will have regular dressing on his salads but otherwise he thinks he is usually eating low-fat meals except hot dogs  No hypoglycemia on the current dose of Tresiba  FASTING readings are quite variable and he does not know why, does not usually have snacks at bedtime        Side effects from medications have been: None  Glucose monitoring:  done about 2 times a day         Glucometer: One Touch.       Blood Glucose  readings by review of home monitor download show   PRE-MEAL Fasting Lunch Dinner Bedtime Overall  Glucose range:  94-222      Mean/median:  149     153   POST-MEAL PC Breakfast PC Lunch PC Dinner  Glucose range:    100-249  Mean/median:    180   PREVIOUS readings:  PRE-MEAL  morning Lunch Dinner Bedtime Overall  Glucose range:  111-234    102-265   Mean/median:  162    189  170     Self-care: The diet that the patient has been following is: tries to limit sweets.     Meal times are:  Breakfast is at after 9 AM  Typical meal intake: Breakfast is cheerios at times            Dietician visit, most recent: 2019               Exercise:  Only when playing golf, not much recently  Weight history:  Wt Readings from Last 3 Encounters:  10/03/18 192 lb (87.1 kg)  07/06/18 190 lb 3.2 oz (86.3 kg)  06/30/18 190 lb (86.2 kg)    Glycemic control:   Lab Results  Component Value Date   HGBA1C 8.0 (A) 10/03/2018   HGBA1C 8.1 (A) 07/06/2018  HGBA1C 11.4 (H) 02/28/2018   Lab Results  Component Value Date   MICROALBUR neg 11/28/2014   LDLCALC 49 08/23/2017   CREATININE 1.93 (H) 04/22/2018   No results found for: [facility]  Lab Results  Component Value Date   FRUCTOSAMINE 323 (H) 04/22/2018      Allergies as of 10/03/2018   No Known Allergies     Medication List       Accurate as of October 03, 2018 12:16 PM. Always use your most recent med list.        amLODipine 10 MG tablet Commonly known as:  NORVASC Take 1 tablet (10 mg total) by mouth daily.   ammonium lactate 12 % cream Commonly known as:  AMLACTIN APPLY TO DRY SKIN ON BOTTOM OF FEET 2 TIMES A DAY   aspirin EC 81 MG tablet Take 1 tablet (81 mg total) by mouth daily.   atorvastatin 40 MG tablet Commonly known as:  LIPITOR Take 1 tablet (40 mg total) by mouth daily.   atropine 1 % ophthalmic solution Place 1 drop into the left eye daily.   cetirizine 10 MG tablet Commonly known as:  ZYRTEC  Take 10 mg by mouth daily.   hydrochlorothiazide 25 MG tablet Commonly known as:  HYDRODIURIL Take 1 tablet (25 mg total) by mouth daily.   insulin lispro 100 UNIT/ML KwikPen Commonly known as:  HumaLOG KwikPen Inject 0.1 mLs (10 Units total) into the skin 3 (three) times daily. Inject 10 units under the skin 3 times daily before meals.   Insulin Pen Needle 31G X 5 MM Misc Commonly known as:  Easy Touch Pen Needles USE TWICE DAILY TO INJECT INSULIN   latanoprost 0.005 % ophthalmic solution Commonly known as:  XALATAN Place 1 drop into the left eye at bedtime.   lisinopril 40 MG tablet Commonly known as:  PRINIVIL,ZESTRIL Take 1 tablet (40 mg total) by mouth daily.   metoprolol succinate 100 MG 24 hr tablet Commonly known as:  TOPROL-XL TAKE 1 TABLET BY MOUTH  DAILY WITH OR IMMEDIATELY  FOLLOWING A MEAL.   ONE TOUCH ULTRA TEST test strip Generic drug:  glucose blood Use to check BG twice daily   OneTouch Delica Lancets 91Y Misc Use to check BG twice daily   Tresiba FlexTouch 100 UNIT/ML Sopn FlexTouch Pen Generic drug:  insulin degludec INJECT SUBCUTANEOUSLY 80  UNITS DAILY   VITAMIN B6 PO Take by mouth daily.   vitamin C 500 MG tablet Commonly known as:  ASCORBIC ACID Take 500 mg by mouth daily.   vitamin E 1000 UNIT capsule Generic drug:  vitamin E Take 1,000 Units by mouth daily.       Allergies: No Known Allergies  Past Medical History:  Diagnosis Date  . AKI (acute kidney injury) ([place])   . Allergy   . Anxiety   . Arthritis    Right shoulder  . Atrial fibrillation ([place])   . Benign essential HTN   . Carotid artery stenosis   . Cataract   . CHF (congestive heart failure) ([place])   . CVA (cerebral vascular accident) ([place]) 04/28/2016  . Decreased vision    left eye  . Diabetes mellitus without complication ([place])    Takes Metformin  . Hyperlipidemia   . Hypertension   . Salmonella bacteremia 04/29/2016  . Sepsis ([place]) 04/29/2016  . Stroke ([place])  11/18/2015   no deficits  . TIA (transient ischemic attack)    affected left eye  . Urgency of urination  Past Surgical History:  Procedure Laterality Date  . AORTIC VALVE REPLACEMENT    . BACK SURGERY  80s  . CARDIAC VALVE REPLACEMENT  2010  . ENDARTERECTOMY Right 01/13/2016   Procedure: ENDARTERECTOMY CAROTID - RIGHT;  Surgeon: Conrad Richfield, MD;  Location: [place];  Service: Vascular;  Laterality: Right;  . EYE SURGERY    . laser surgery, left eye  Left 10-29-2015    retinal surgery/ Deloria Lair MD   . PERIPHERAL VASCULAR CATHETERIZATION Right 11/18/2015   Procedure: Carotid Angiography;  Surgeon: Conrad [place], MD;  Location: [place] [facility];  Service: Cardiovascular;  Laterality: Right;  . PERIPHERAL VASCULAR CATHETERIZATION N/A 11/18/2015   Procedure: Aortic Arch Angiography;  Surgeon: Conrad Evergreen, MD;  Location: [place] [facility];  Service: Cardiovascular;  Laterality: N/A;  . TEE WITHOUT CARDIOVERSION N/A 05/05/2016   Procedure: TRANSESOPHAGEAL ECHOCARDIOGRAM (TEE);  Surgeon: Lelon Perla, MD;  Location[facility];  Service: Cardiovascular;  Laterality: N/A;    Family History  Problem Relation Age of Onset  . Heart disease Mother   . Cancer Mother        breast  . Stroke Father 10  . Hypertension Father   . Early death Sister        infant death  . Heart disease Brother   . Hydrocephalus Brother   . Heart disease Brother   . Diabetes Brother   . Cancer Brother        prostat  . Healthy Son   . Healthy Son     Social History:  reports that he quit smoking about 32 years ago. His smoking use included cigarettes. He has a 40.00 pack-year smoking history. He has never used smokeless tobacco. He reports that he does not drink alcohol or use drugs.   Review of Systems   Lipid history: Currently on atorvastatin 40 mg daily prescribed by his PCP Has not had any recent follow-up Has history of CAD and CVD    Lab Results  Component Value Date   CHOL 100  08/23/2017   HDL 32 (L) 08/23/2017   LDLCALC 49 08/23/2017   LDLDIRECT 62 02/16/2017   TRIG 96 08/23/2017   CHOLHDL 3.1 08/23/2017           Hypertension:  On multiple drugs including hydrochlorothiazide 25 mg and lisinopril 40 mg daily, Norvasc 10 mg daily prescribed by PCP  BP Readings from Last 3 Encounters:  10/03/18 122/62  07/06/18 132/80  06/30/18 123/77    Most recent eye exam was in 7/18  Most recent foot exam: 7/19    LABS:  Office Visit on 10/03/2018  Component Date Value Ref Range Status  . POC Glucose 10/03/2018 198* 70 - 99 mg/dl Final  . Hemoglobin A1C 10/03/2018 8.0* 4.0 - 5.6 % Final    Physical Examination:  BP 122/62 (BP Location: Left Arm, Patient Position: Sitting, Cuff Size: Normal)   Pulse 76   Temp 97.6 F (36.4 C)   Resp 12   Ht 5' 3"  (1.6 m)   Wt 192 lb (87.1 kg)   SpO2 98%   BMI 34.01 kg/m       ASSESSMENT:  Diabetes type 2, uncontrolled on insulin  See history of present illness for detailed discussion of current diabetes management, blood sugar patterns and problems identified  His most recent A1c is 8 and about the same although has been as high as 11.4 previously  He is on Trulicity as well as large  doses of basal insulin and only Humalog at suppertime However he still appears to have high readings after meals and likely some of his high readings fasting are related to postprandial hyperglycemia the night before He is usually trying to do fairly well with his diet but still not covering some of his meals adequately Also he is not checking his sugars after meals as directed and discussed blood sugar is nearly 200 about 3 hours after breakfast today Currently not very active   LIPIDS: Overdue for follow-up and will check today  HYPERTENSION: Well controlled  Renal dysfunction with mild increase in microalbumin: Needs follow-up Likely needs to be seen regularly by nephrologist and will defer to PCP   PLAN:    He will  need to start checking blood sugars after meals and alternate with fasting readings  Discussed blood sugar targets about 2 hours after meals  His wife will help him monitor his blood sugar better  May consider freestyle libre if he is able to check 4 times a day  If he is eating cereal in the morning he will need to have at least 5 units of Humalog for coverage  Discussed that he will need to start making a note of what makes his sugar go up in the evening at dinnertime, likely needs to take 12-14 units when eating more carbohydrate or eating out  Also will need to cover his lunch if eating hotdogs  Walk at least every other day or more for exercise  Continue Trulicity  Change Tresiba to the U-200 formulation  Patient Instructions  Check blood sugars on waking up 3-4 days a week  Also check blood sugars about 2 hours after meals and do this after different meals by rotation  Recommended blood sugar levels on waking up are 90-130 and about 2 hours after meal is 130-160  Please bring your blood sugar monitor to each visit, thank you  Take 12-14 Humalog AT SUPPER IF eating bread, higher fat or larger meals  4-5 Humalog at Breakfast if eating cereal and lunch with bread  All low fat meals, dressings  Walk daily        Counseling time on subjects discussed in assessment and plan sections is over 50% of today's 25 minute visit   [name] 10/03/2018, 12:16 PM   Note: This office note was prepared with Dragon voice recognition system technology. Any transcriptional errors that result from this process are unintentional.

## 2018-10-03 NOTE — Patient Instructions (Addendum)
Check blood sugars on waking up 3-4 days a week  Also check blood sugars about 2 hours after meals and do this after different meals by rotation  Recommended blood sugar levels on waking up are 90-130 and about 2 hours after meal is 130-160  Please bring your blood sugar monitor to each visit, thank you  Take 12-14 Humalog AT SUPPER IF eating bread, higher fat or larger meals  4-5 Humalog at Breakfast if eating cereal and lunch with bread  All low fat meals, dressings  Walk daily

## 2018-10-04 ENCOUNTER — Other Ambulatory Visit: Payer: Self-pay | Admitting: Family Medicine

## 2018-10-24 ENCOUNTER — Other Ambulatory Visit: Payer: Self-pay

## 2018-10-24 DIAGNOSIS — I6522 Occlusion and stenosis of left carotid artery: Secondary | ICD-10-CM

## 2018-10-24 DIAGNOSIS — I779 Disorder of arteries and arterioles, unspecified: Secondary | ICD-10-CM

## 2018-10-24 DIAGNOSIS — I739 Peripheral vascular disease, unspecified: Secondary | ICD-10-CM

## 2018-10-24 NOTE — Progress Notes (Signed)
carotid

## 2018-11-03 ENCOUNTER — Encounter (HOSPITAL_COMMUNITY): Payer: Medicare Other

## 2018-11-03 ENCOUNTER — Ambulatory Visit: Payer: Medicare Other | Admitting: Family

## 2018-11-17 ENCOUNTER — Telehealth: Payer: Self-pay | Admitting: Interventional Cardiology

## 2018-11-17 NOTE — Telephone Encounter (Signed)
Spoke with pt and he is agreeable to a phone visit with Dr. [name]. No smart phone so unable to do virtual.  Pt aware of consent and that CMA will call him the morning of to get vitals and go over meds.  Pt verbalized understanding and was in agreement with this plan.      Virtual Visit Pre-Appointment Phone Call  "(Name), I am calling you today to discuss your upcoming appointment. We are currently trying to limit exposure to the virus that causes COVID-19 by seeing patients at home rather than in the office."  1. "What is the BEST phone number to call the day of the visit?" - include this in appointment notes  2. "Do you have or have access to (through a family member/friend) a smartphone with video capability that we can use for your visit?" a. If yes - list this number in appt notes as "cell" (if different from BEST phone #) and list the appointment type as a VIDEO visit in appointment notes b. If no - list the appointment type as a PHONE visit in appointment notes  3. Confirm consent - "In the setting of the current Covid19 crisis, you are scheduled for a (phone or video) visit with your provider on (date) at (time).  Just as we do with many in-office visits, in order for you to participate in this visit, we must obtain consent.  If you'd like, I can send this to your mychart (if signed up) or email for you to review.  Otherwise, I can obtain your verbal consent now.  All virtual visits are billed to your insurance company just like a normal visit would be.  By agreeing to a virtual visit, we'd like you to understand that the technology does not allow for your provider to perform an examination, and thus may limit your provider's ability to fully assess your condition. If your provider identifies any concerns that need to be evaluated in person, we will make arrangements to do so.  Finally, though the technology is pretty good, we cannot assure that it will always work on either your or our end,  and in the setting of a video visit, we may have to convert it to a phone-only visit.  In either situation, we cannot ensure that we have a secure connection.  Are you willing to proceed?" STAFF: Did the patient verbally acknowledge consent to telehealth visit? Document YES/NO here: yes  4. Advise patient to be prepared - "Two hours prior to your appointment, go ahead and check your blood pressure, pulse, oxygen saturation, and your weight (if you have the equipment to check those) and write them all down. When your visit starts, your provider will ask you for this information. If you have an Apple Watch or Kardia device, please plan to have heart rate information ready on the day of your appointment. Please have a pen and paper handy nearby the day of the visit as well."  5. Give patient instructions for MyChart download to smartphone OR [email or web address] as below if video visit (depending on what platform provider is using)  6. Inform patient they will receive a phone call 15 minutes prior to their appointment time (may be from unknown caller ID) so they should be prepared to answer    TELEPHONE CALL NOTE  Evan Stanley has been deemed a candidate for a follow-up tele-health visit to limit community exposure during the Covid-19 pandemic. I spoke with the patient via phone to ensure availability of  phone/video source, confirm preferred email & phone number, and discuss instructions and expectations.  I reminded Evan Stanley to be prepared with any vital sign and/or heart rhythm information that could potentially be obtained via home monitoring, at the time of his visit. I reminded Evan Stanley to expect a phone call prior to his visit.  , L, LPN 1/61/0960 4:54 PM   INSTRUCTIONS FOR DOWNLOADING THE MYCHART APP TO SMARTPHONE  - The patient must first make sure to have activated MyChart and know their login information - If Apple, go to [employer] and type in MyChart in the search bar  and download the app. If Android, ask patient to go to [employer] and type in [place] in the search bar and download the app. The app is free but as with any other app downloads, their phone may require them to verify saved payment information or Apple/Android password.  - The patient will need to then log into the app with their MyChart username and password, and select [facility] as their healthcare provider to link the account. When it is time for your visit, go to the MyChart app, find appointments, and click Begin Video Visit. Be sure to Select Allow for your device to access the Microphone and Camera for your visit. You will then be connected, and your provider will be with you shortly.  **If they have any issues connecting, or need assistance please contact MyChart service desk [phone])**  **If using a computer, in order to ensure the best quality for their visit they will need to use either of the following Internet Browsers: [employer], or Google Chrome**  IF USING DOXIMITY or DOXY.ME - The patient will receive a link just prior to their visit by text.     FULL LENGTH CONSENT FOR TELE-HEALTH VISIT   I hereby voluntarily request, consent and authorize [facility] and its employed or contracted physicians, physician assistants, nurse practitioners or other licensed health care professionals (the Practitioner), to provide me with telemedicine health care services (the "Services") as deemed necessary by the treating Practitioner. I acknowledge and consent to receive the Services by the Practitioner via telemedicine. I understand that the telemedicine visit will involve communicating with the Practitioner through live audiovisual communication technology and the disclosure of certain medical information by electronic transmission. I acknowledge that I have been given the opportunity to request an in-person assessment or other available alternative prior to the  telemedicine visit and am voluntarily participating in the telemedicine visit.  I understand that I have the right to withhold or withdraw my consent to the use of telemedicine in the course of my care at any time, without affecting my right to future care or treatment, and that the Practitioner or I may terminate the telemedicine visit at any time. I understand that I have the right to inspect all information obtained and/or recorded in the course of the telemedicine visit and may receive copies of available information for a reasonable fee.  I understand that some of the potential risks of receiving the Services via telemedicine include:  [place] Kitchen Delay or interruption in medical evaluation due to technological equipment failure or disruption; . Information transmitted may not be sufficient (e.g. poor resolution of images) to allow for appropriate medical decision making by the Practitioner; and/or  . In rare instances, security protocols could fail, causing a breach of personal health information.  Furthermore, I acknowledge that it is my responsibility to provide information about my medical history,  conditions and care that is complete and accurate to the best of my ability. I acknowledge that Practitioner's advice, recommendations, and/or decision may be based on factors not within their control, such as incomplete or inaccurate data provided by me or distortions of diagnostic images or specimens that may result from electronic transmissions. I understand that the practice of medicine is not an exact science and that Practitioner makes no warranties or guarantees regarding treatment outcomes. I acknowledge that I will receive a copy of this consent concurrently upon execution via email to the email address I last provided but may also request a printed copy by calling the office of [facility].    I understand that my insurance will be billed for this visit.   I have read or had this consent read to me.  . I understand the contents of this consent, which adequately explains the benefits and risks of the Services being provided via telemedicine.  . I have been provided ample opportunity to ask questions regarding this consent and the Services and have had my questions answered to my satisfaction. . I give my informed consent for the services to be provided through the use of telemedicine in my medical care  By participating in this telemedicine visit I agree to the above.

## 2018-11-22 NOTE — Progress Notes (Signed)
Virtual Visit via Video Note   This visit type was conducted due to national recommendations for restrictions regarding the COVID-19 Pandemic (e.g. social distancing) in an effort to limit this patient's exposure and mitigate transmission in our community.  Due to his co-morbid illnesses, this patient is at least at moderate risk for complications without adequate follow up.  This format is felt to be most appropriate for this patient at this time.  All issues noted in this document were discussed and addressed.  A limited physical exam was performed with this format.  Please refer to the patient's chart for his consent to telehealth for [facility].   Date:  11/22/2018   ID:  Evan Stanley, DOB 1941/06/11, MRN [id number]  Patient Location: Home Provider Location: Office  PCP:  [name], DO  Cardiologist:  Sinclair Grooms, MD  Electrophysiologist:  None   Evaluation Performed:  Follow-Up Visit  Chief Complaint:  AVR/ CAD -->CABG  History of Present Illness:    Evan Stanley is a 78 y.o. male with bioprosthetic aortic valve 2010, hyperlipidemia, coronary artery disease with prior bypass grafting 2010, left ventricular systolic dysfunction, right cerebral CVA (2017), total occlusion left ICA, RCEA, and  DM type II.  He is started getting out and becoming more active as the weather has improved.  Once or twice a week he is playing 9 holes of golf.  He would like to play 18 holes but says he gives out.  He does believe that endurance is improving.  He denies orthopnea, PND, palpitations, syncope, and edema.  Overall, feels he is doing well from cardiac standpoint.  The patient does not have symptoms concerning for COVID-19 infection (fever, chills, cough, or new shortness of breath).    Past Medical History:  Diagnosis Date  . AKI (acute kidney injury) ([place])   . Allergy   . Anxiety   . Arthritis    Right shoulder  . Atrial fibrillation ([place])   . Benign essential HTN   .  Carotid artery stenosis   . Cataract   . CHF (congestive heart failure) ([place])   . CVA (cerebral vascular accident) ([place]) 04/28/2016  . Decreased vision    left eye  . Diabetes mellitus without complication ([place])    Takes Metformin  . Hyperlipidemia   . Hypertension   . Salmonella bacteremia 04/29/2016  . Sepsis ([place]) 04/29/2016  . Stroke ([place]) 11/18/2015   no deficits  . TIA (transient ischemic attack)    affected left eye  . Urgency of urination    Past Surgical History:  Procedure Laterality Date  . AORTIC VALVE REPLACEMENT    . BACK SURGERY  80s  . CARDIAC VALVE REPLACEMENT  2010  . ENDARTERECTOMY Right 01/13/2016   Procedure: ENDARTERECTOMY CAROTID - RIGHT;  Surgeon: Conrad Westview, MD;  Location: [facility];  Service: Vascular;  Laterality: Right;  . EYE SURGERY    . laser surgery, left eye  Left 10-29-2015    retinal surgery/ Deloria Lair MD   . PERIPHERAL VASCULAR CATHETERIZATION Right 11/18/2015   Procedure: Carotid Angiography;  Surgeon: Conrad Wildwood, MD;  Location: [place] [facility];  Service: Cardiovascular;  Laterality: Right;  . PERIPHERAL VASCULAR CATHETERIZATION N/A 11/18/2015   Procedure: Aortic Arch Angiography;  Surgeon: Conrad Fairview, MD;  Location: [facility];  Service: Cardiovascular;  Laterality: N/A;  . TEE WITHOUT CARDIOVERSION N/A 05/05/2016   Procedure: TRANSESOPHAGEAL ECHOCARDIOGRAM (TEE);  Surgeon: Lelon Perla, MD;  [facility];  Service: Cardiovascular;  Laterality: N/A;     No outpatient medications have been marked as taking for the 11/23/18 encounter (Appointment) with Belva Crome, MD.     Allergies:   Patient has no known allergies.   Social History   Tobacco Use  . Smoking status: Former Smoker    Packs/day: 2.00    Years: 20.00    Pack years: 40.00    Types: Cigarettes    Last attempt to quit: 07/20/1986    Years since quitting: 32.3  . Smokeless tobacco: Never Used  Substance Use Topics  . Alcohol use: No     Alcohol/week: 0.0 standard drinks  . Drug use: No     Family Hx: The patient's family history includes Cancer in his brother and mother; Diabetes in his brother; Early death in his sister; Healthy in his son and son; Heart disease in his brother, brother, and mother; Hydrocephalus in his brother; Hypertension in his father; Stroke (age of onset: 55) in his father.  ROS:   Please see the history of present illness.    Having difficulty with his back, this is been chronic.  This is not rate limiting at this time. All other systems reviewed and are negative.   Prior CV studies:   The following studies were reviewed today:   Last hemoglobin A1c in March was 8.0  Labs/Other Tests and Data Reviewed:    EKG:  No ECG reviewed.  Recent Labs: 10/03/2018: ALT 27; BUN 28; Creatinine, Ser 1.96; Potassium 4.1; Sodium 137   Recent Lipid Panel Lab Results  Component Value Date/Time   CHOL 86 10/03/2018 10:26 AM   CHOL 100 08/23/2017 08:50 AM   TRIG 97.0 10/03/2018 10:26 AM   HDL 29.90 (L) 10/03/2018 10:26 AM   HDL 32 (L) 08/23/2017 08:50 AM   CHOLHDL 3 10/03/2018 10:26 AM   LDLCALC 36 10/03/2018 10:26 AM   LDLCALC 49 08/23/2017 08:50 AM   LDLDIRECT 62 02/16/2017 11:51 AM    Wt Readings from Last 3 Encounters:  10/03/18 192 lb (87.1 kg)  07/06/18 190 lb 3.2 oz (86.3 kg)  06/30/18 190 lb (86.2 kg)     Objective:    Vital Signs:  There were no vitals taken for this visit.   VITAL SIGNS:  reviewed  ASSESSMENT & PLAN:    1. Coronary artery disease involving native coronary artery of native heart without angina pectoris   2. S/P aortic valve replacement with tissue   3. Essential hypertension   4. Mixed hyperlipidemia   5. CKD stage 3 secondary to diabetes ([place])   6. Type 2 diabetes mellitus with stage 3 chronic kidney disease, without long-term current use of insulin ([place])   7. 2019 novel coronavirus disease (COVID-19)    PLAN:  1. Secondary prevention is discussed.   Particular emphasis placed on activity and improving hemoglobin A1c. 2. The patient is clinically well.  Surveillance on valve will be performed in 6 to 12 months. 3. Target blood pressure 130/80 given his burden of risk factors. 4. Target LDL less than 70 is being achieved with most recent LDL of 36.  Would continue high intensity statin therapy. 5. Not discussed 6. I would love for the primary physician to consider adding an SGLT2 agent such as dapagliflozin or empagliflozin.  Overall education and awareness concerning primary/secondary risk prevention was discussed in detail: LDL less than 70, hemoglobin A1c less than 7, blood pressure target less than 130/80 mmHg, >150 minutes  of moderate aerobic activity per week, avoidance of smoking, weight control (via diet and exercise), and continued surveillance/management of/for obstructive sleep apnea.   COVID-19 Education: The signs and symptoms of COVID-19 were discussed with the patient and how to seek care for testing (follow up with PCP or arrange E-visit).  The importance of social distancing was discussed today.  Time:   Today, I have spent 15 minutes with the patient with telehealth technology discussing the above problems.     Medication Adjustments/Labs and Tests Ordered: Current medicines are reviewed at length with the patient today.  Concerns regarding medicines are outlined above.   Tests Ordered: No orders of the defined types were placed in this encounter.   Medication Changes: No orders of the defined types were placed in this encounter.   Disposition:  Follow up in 1 year(s)  Signed, Sinclair Grooms, MD  11/22/2018 8:51 PM    [facility]

## 2018-11-23 ENCOUNTER — Encounter: Payer: Self-pay | Admitting: Interventional Cardiology

## 2018-11-23 ENCOUNTER — Telehealth (INDEPENDENT_AMBULATORY_CARE_PROVIDER_SITE_OTHER): Payer: Medicare Other | Admitting: Interventional Cardiology

## 2018-11-23 ENCOUNTER — Other Ambulatory Visit: Payer: Self-pay

## 2018-11-23 VITALS — BP 132/76 | HR 66 | Ht 63.0 in | Wt 185.6 lb

## 2018-11-23 DIAGNOSIS — E782 Mixed hyperlipidemia: Secondary | ICD-10-CM

## 2018-11-23 DIAGNOSIS — E1122 Type 2 diabetes mellitus with diabetic chronic kidney disease: Secondary | ICD-10-CM

## 2018-11-23 DIAGNOSIS — I2510 Atherosclerotic heart disease of native coronary artery without angina pectoris: Secondary | ICD-10-CM | POA: Diagnosis not present

## 2018-11-23 DIAGNOSIS — I10 Essential (primary) hypertension: Secondary | ICD-10-CM

## 2018-11-23 DIAGNOSIS — Z953 Presence of xenogenic heart valve: Secondary | ICD-10-CM

## 2018-11-23 DIAGNOSIS — N183 Chronic kidney disease, stage 3 (moderate): Secondary | ICD-10-CM

## 2018-11-23 DIAGNOSIS — U071 COVID-19: Secondary | ICD-10-CM

## 2018-11-23 NOTE — Patient Instructions (Signed)
Medication Instructions:  Your physician recommends that you continue on your current medications as directed. Please refer to the Current Medication list given to you today.  If you need a refill on your cardiac medications before your next appointment, please call your pharmacy.   Lab work: None If you have labs (blood work) drawn today and your tests are completely normal, you will receive your results only by: . MyChart Message (if you have MyChart) OR . A paper copy in the mail If you have any lab test that is abnormal or we need to change your treatment, we will call you to review the results.  Testing/Procedures: None  Follow-Up: At [facility], you and your health needs are our priority.  As part of our continuing mission to provide you with exceptional heart care, we have created designated Provider Care Teams.  These Care Teams include your primary Cardiologist (physician) and Advanced Practice Providers (APPs -  Physician Assistants and Nurse Practitioners) who all work together to provide you with the care you need, when you need it. You will need a follow up appointment in 12 months.  Please call our office 2 months in advance to schedule this appointment.  You may see Henry W Hey III, MD or one of the following Advanced Practice Providers on your designated Care Team:   Lori Gerhardt, NP Laura Ingold, NP . Jill McDaniel, NP  Any Other Special Instructions Will Be Listed Below (If Applicable).    

## 2018-11-28 ENCOUNTER — Other Ambulatory Visit: Payer: Self-pay

## 2018-11-29 ENCOUNTER — Encounter: Payer: Self-pay | Admitting: Family Medicine

## 2018-11-29 ENCOUNTER — Ambulatory Visit (INDEPENDENT_AMBULATORY_CARE_PROVIDER_SITE_OTHER): Payer: Medicare Other | Admitting: Family Medicine

## 2018-11-29 ENCOUNTER — Ambulatory Visit: Payer: Medicare Other | Admitting: *Deleted

## 2018-11-29 VITALS — BP 132/76 | HR 66 | Wt 185.8 lb

## 2018-11-29 DIAGNOSIS — I10 Essential (primary) hypertension: Secondary | ICD-10-CM

## 2018-11-29 DIAGNOSIS — I6990 Unspecified sequelae of unspecified cerebrovascular disease: Secondary | ICD-10-CM

## 2018-11-29 DIAGNOSIS — E1159 Type 2 diabetes mellitus with other circulatory complications: Secondary | ICD-10-CM

## 2018-11-29 DIAGNOSIS — E1169 Type 2 diabetes mellitus with other specified complication: Secondary | ICD-10-CM

## 2018-11-29 DIAGNOSIS — N183 Chronic kidney disease, stage 3 unspecified: Secondary | ICD-10-CM

## 2018-11-29 DIAGNOSIS — E1122 Type 2 diabetes mellitus with diabetic chronic kidney disease: Secondary | ICD-10-CM

## 2018-11-29 DIAGNOSIS — E785 Hyperlipidemia, unspecified: Secondary | ICD-10-CM

## 2018-11-29 DIAGNOSIS — I152 Hypertension secondary to endocrine disorders: Secondary | ICD-10-CM

## 2018-11-29 MED ORDER — INSULIN PEN NEEDLE 31G X 5 MM MISC: 3 refills | Status: DC

## 2018-11-29 MED ORDER — HYDROCHLOROTHIAZIDE 25 MG PO TABS: 25.0000 mg | ORAL_TABLET | Freq: Every day | ORAL | 1 refills | Status: DC

## 2018-11-29 NOTE — Patient Instructions (Signed)
Visit Information  Goals Addressed            This Visit's Progress     Patient Stated   . Patient Stated (pt-stated)       Current Barriers:  . Knowledge Deficits related to prescription assistance programs . Financial Constraints.   Nurse Case Manager Clinical Goal(s):  . Over the next 30 days, patient will work with RN Case Manager to address needs related to diabetes  . Over the next 30 days, patient will work with CM team pharmacist to apply for prescription assistance  Interventions:  . Advised patient to expect a telephone call from [facility] team and that they will likely be asked questions related to income and finances.  . Reviewed medications with patient and discussed Tresiba, Humalog, and Trulicity dose and cost . Discussed plans with patient for ongoing care management follow up and provided patient with direct contact information for care management team . Pharmacy referral for helpt with patient prescription assistance   Patient Self Care Activities:  . Performs ADL's independently . Performs IADL's independently  . Has some help from his wife  Initial goal documentation         Evan Stanley was given information about Chronic Care Management services today including:  1. CCM service includes personalized support from designated clinical staff supervised by his physician, including individualized plan of care and coordination with other care providers 2. 24/7 contact phone numbers for assistance for urgent and routine care needs. 3. Service will only be billed when office clinical staff spend 20 minutes or more in a month to coordinate care. 4. Only one practitioner may furnish and bill the service in a calendar month. 5. The patient may stop CCM services at any time (effective at the end of the month) by phone call to the office staff. 6. The patient will be responsible for cost sharing (co-pay) of up to 20% of the service fee (after annual  deductible is met).  Patient agreed to services and verbal consent obtained.   The patient verbalized understanding of instructions provided today and declined a print copy of patient instruction materials.   Orders Placed This Encounter  Procedures  . AMB Referral to [facility]    Referral Priority:   Routine    Referral Type:   Consultation    Referral Reason:   [facility]    Number of Visits Requested:   1    Follow up Plan RNCM will reach out to patient/wife over the next 7 days   , RN-BC, BSN Nurse Care Manager [facility] [phone]   

## 2018-11-29 NOTE — Progress Notes (Signed)
Telephone visit  Subjective: CC: f/u HTN, HLD, DM2, CAD, CKD3 PCP: [name], DO Evan Stanley is a 78 y.o. male calls for telephone consult today. Patient provides verbal consent for consult held via phone.  Location of patient: home Location of provider: Working remotely from home Others present for call: wife  1. Type 2 Diabetes with hypertension, hyperlipidemia and CKD 3:  Patient saw his endocrinologist on 10/03/2018.  His A1c is unchanged at bedtime at 8.0.  I was concerned that he was continuing to have high postprandial blood sugars and his blood monitoring schedule was reinforced.  Goal blood sugars were reinforced. High at home: 202; Low at home: 85, FBG: 86-145. Taking medication(s): Tresiba 80u QAM, Humalog 4-5 breakfast, supper 28-78M, Trulicity. Side effects: none  Last eye exam: UTD Last foot exam: Sees Dr Irving Shows 5/28 Last A1c:  Lab Results  Component Value Date   HGBA1C 8.0 (A) 10/03/2018   Nephropathy screen indicated?: on ACE-I Last flu, zoster and/or pneumovax:  Immunization History  Administered Date(s) Administered  . Influenza Whole 05/26/2010  . Influenza, High Dose Seasonal PF 05/20/2017, 05/23/2018  . Influenza,inj,Quad PF,6+ Mos 06/14/2013, 04/16/2014, 06/18/2015  . Influenza-Unspecified 05/14/2016  . Pneumococcal Conjugate-13 06/18/2015  . Pneumococcal Polysaccharide-23 12/16/2016  . Tdap 03/05/2011    ROS: denies dizziness, LOC, unintended weight loss/gain, foot ulcerations, numbness or tingling in extremities or chest pain.    ROS: Per HPI  No Known Allergies Past Medical History:  Diagnosis Date  . AKI (acute kidney injury) ([place])   . Allergy   . Anxiety   . Arthritis    Right shoulder  . Atrial fibrillation ([place])   . Benign essential HTN   . Carotid artery stenosis   . Cataract   . CHF (congestive heart failure) ([place])   . CVA (cerebral vascular accident) ([place]) 04/28/2016  . Decreased vision    left eye  . Diabetes  mellitus without complication ([place])    Takes Metformin  . Hyperlipidemia   . Hypertension   . Salmonella bacteremia 04/29/2016  . Sepsis ([place]) 04/29/2016  . Stroke ([place]) 11/18/2015   no deficits  . TIA (transient ischemic attack)    affected left eye  . Urgency of urination     Current Outpatient Medications:  .  amLODipine (NORVASC) 10 MG tablet, Take 1 tablet (10 mg total) by mouth daily., Disp: 90 tablet, Rfl: 3 .  ammonium lactate (AMLACTIN) 12 % cream, APPLY TO DRY SKIN ON BOTTOM OF FEET 2 TIMES A DAY, Disp: , Rfl: 4 .  aspirin EC 81 MG tablet, Take 1 tablet (81 mg total) by mouth daily., Disp: 90 tablet, Rfl: 3 .  atorvastatin (LIPITOR) 40 MG tablet, Take 1 tablet (40 mg total) by mouth daily., Disp: 90 tablet, Rfl: 3 .  atropine 1 % ophthalmic solution, Place 1 drop into the left eye daily. , Disp: , Rfl: 6 .  cetirizine (ZYRTEC) 10 MG tablet, Take 10 mg by mouth daily., Disp: , Rfl:  .  Dulaglutide (TRULICITY) 1.5 VE/7.2CN SOPN, Inject 1.5 mg into the skin once a week. Inject 1.32m into the skin once weekly., Disp: , Rfl:  .  hydrochlorothiazide (HYDRODIURIL) 25 MG tablet, TAKE 1 TABLET BY MOUTH  DAILY, Disp: 90 tablet, Rfl: 0 .  Insulin Degludec (TRESIBA FLEXTOUCH) 200 UNIT/ML SOPN, Inject 80 Units into the skin daily., Disp: 9 pen, Rfl: 1 .  insulin lispro (HUMALOG KWIKPEN) 100 UNIT/ML KwikPen, Inject into the skin. 4-5 Units at breakfast; 12-14 at  supper, Disp: , Rfl:  .  Insulin Pen Needle (EASY TOUCH PEN NEEDLES) 31G X 5 MM MISC, USE TWICE DAILY TO INJECT INSULIN, Disp: 180 each, Rfl: 3 .  latanoprost (XALATAN) 0.005 % ophthalmic solution, Place 1 drop into the left eye at bedtime. , Disp: , Rfl:  .  lisinopril (PRINIVIL,ZESTRIL) 40 MG tablet, Take 1 tablet (40 mg total) by mouth daily., Disp: 90 tablet, Rfl: 3 .  metoprolol succinate (TOPROL-XL) 100 MG 24 hr tablet, TAKE 1 TABLET BY MOUTH  DAILY WITH OR IMMEDIATELY  FOLLOWING A MEAL., Disp: 90 tablet, Rfl: 3 .  ONE TOUCH ULTRA  TEST test strip, Use to check BG twice daily, Disp: 200 each, Rfl: 3 .  ONETOUCH DELICA LANCETS 19T MISC, Use to check BG twice daily, Disp: 100 each, Rfl: 5 .  vitamin C (ASCORBIC ACID) 500 MG tablet, Take 500 mg by mouth daily., Disp: , Rfl:  .  vitamin E (VITAMIN E) 1000 UNIT capsule, Take 1,000 Units by mouth daily., Disp: , Rfl:   Assessment/ Plan: 78 y.o. male   1. Type 2 diabetes mellitus with stage 3 chronic kidney disease, without long-term current use of insulin (HCC) His blood sugars sound relatively controlled except for around mealtimes.  I am certain that his dietary choices affect postprandial blood sugars.  I reviewed his endocrinology note from March.  I agree that patient would warrant referral to nephrology given advanced CKD 3 with mildly increased urine microalbumin compared to previous.  He is having some trouble affording medications as he is in the donut hole.  I am reaching out to CCM to see if perhaps they might be able to assist patient with completing financial assistance forms through the manufactures of these medications.  Patient does not have access to a computer or Internet. - Referral to Chronic Care Management Services - Ambulatory referral to Nephrology  2. Hypertension associated with diabetes ([place]) Controlled - Referral to Chronic Care Management Services  3. Hyperlipidemia associated w/ type 2 DM goal <70 Lipid panel was just obtained in March and LDL was at goal.  Continue statin. - Referral to Chronic Care Management Services  Meds ordered this encounter  Medications  . hydrochlorothiazide (HYDRODIURIL) 25 MG tablet    Sig: Take 1 tablet (25 mg total) by mouth daily.    Dispense:  90 tablet    Refill:  1  . Insulin Pen Needle (EASY TOUCH PEN NEEDLES) 31G X 5 MM MISC    Sig: USE FOUR DAILY TO INJECT INSULIN    Dispense:  400 each    Refill:  3    E11.22   Start time: 8:01a End time: 8:16a  Total time spent on patient care (including  telephone call/ virtual visit): 25 minutes  Thoreau, Anthony [phone]

## 2018-11-29 NOTE — Chronic Care Management (AMB) (Signed)
  Chronic Care Management   Telephone Outreach Note  11/29/2018 Name: Evan Stanley MRN: [id number] DOB: May 18, 1941  Referred by: PCP, Dr.Gottschalk   I reached out to Evan Stanley today by phone in response to a referral sent by Evan Stanley health plan. Evan Stanley and I briefly discussed care management needs related to HTN HLD DMII CKD Stage 3  Evan Stanley wife, Evan Stanley, was given information about Chronic Care Management services today including:  1. CCM service includes personalized support from designated clinical staff supervised by his physician, including individualized plan of care and coordination with other care providers 2. 24/7 contact phone numbers for assistance for urgent and routine care needs. 3. Service will only be billed when office clinical staff spend 20 minutes or more in a month to coordinate care. 4. Only one practitioner may furnish and bill the service in a calendar month. 5. The patient may stop CCM services at any time (effective at the end of the month) by phone call to the office staff. 6. The patient will be responsible for cost sharing (co-pay) of up to 20% of the service fee (after annual deductible is met). (Patient's healthplan covers CCM services at 100% with no cost to the patient)  Patient agreed to services and verbal consent obtained.   [name], DO has been notified of this outreach and Evan Stanley's decision and plan.   Goals Addressed      Patient Stated   . Patient Stated (pt-stated)       Current Barriers:  Marland Kitchen Knowledge Deficits related to prescription assistance programs . Film/video editor.   Nurse Case Manager Clinical Goal(s):  Marland Kitchen Over the next 30 days, patient will work with RN Case Manager to address needs related to diabetes  . Over the next 30 days, patient will work with CM team pharmacist to apply for prescription assistance  Interventions:  . Advised patient to expect a telephone call from [facility] team and that they will likely be asked questions related to income and finances.  . Reviewed medications with patient and discussed [name], Humalog, and Trulicity dose and cost . Discussed plans with patient for ongoing care management follow up and provided patient with direct contact information for care management team . Pharmacy referral for helpt with patient prescription assistance   Patient Self Care Activities:  . Performs ADL's independently . Performs IADL's independently  . Has some help from his wife  Initial goal documentation       Orders Placed This Encounter  Procedures  . AMB Referral to [facility]    Referral Priority:   Routine    Referral Type:   Consultation    Referral Reason:   [facility]    Number of Visits Requested:   1    Follow Up Plan The CM team will reach out to the patient again over the next 7 days.    Chong Sicilian, RN-BC, BSN Nurse Care Manager [facility] [phone]

## 2018-12-01 ENCOUNTER — Other Ambulatory Visit: Payer: Self-pay | Admitting: *Deleted

## 2018-12-01 NOTE — Patient Outreach (Signed)
[facility]) Care Management  12/01/2018  Evan Stanley 1941/06/20 [id number]   Subjective: Telephone call to patient's home number times 2, spoke with patient, and HIPAA verified.  Discussed [facility] MD referral  follow up, patient voiced understanding, and is in agreement to follow up.  Patient states he is aware primary MD has sent in a request for him to receive assistance with getting medications cheaper and request that RNCM discuss this with his wife Evan Stanley, who is currently not home, and will give wife message to call this RNCM back at a later time.   Patient gave RNCM verbal consent to speak with wife Evan Stanley) regarding his healthcare needs as needed.     Objective: Per KPN (Knowledge Performance Now, point of care tool) and chart review, patient has had no recent hospitalizations or ED visits.  Patient has a history of diabetes, hypertension, AKI (acute kidney injury), Atrial fibrillation, Carotid artery stenosis, CHF (congestive heart failure), Hyperlipidemia, Salmonella bacteremia,Stroke, TIA (transient ischemic attack), and Urgency of urination.    Assessment: Received [facility] MD referral on 12/01/2018.  Referral source: Dr. Ronnie Doss.  Referral reason: Medication assistance for Tresiba, Trulicity, and Humalog. Diagnosis diabetes.     Screening  follow up pending patient contact.      Plan: RNCM will send unsuccessful outreach  letter, [facility] pamphlet, will call patient for 2nd telephone outreach attempt within 4 business days, screening follow up, and will proceed with case closure within 10 business days if no return call.     Jebediah Macrae H. Annia Friendly, BSN, [place] [facility] Telephonic CM Phone: [phone] Fax: [phone]

## 2018-12-02 ENCOUNTER — Other Ambulatory Visit: Payer: Self-pay | Admitting: *Deleted

## 2018-12-02 NOTE — Patient Outreach (Addendum)
[facility]) Care Management  12/02/2018  Tamarcus Condie 01-02-1941 [id number]   Subjective: Received voicemail message times 2 from patient's wife Cross Jorge), states she is returning call and requested call back. Patient has given this RNCM verbal consent in the past to speak with wife Harles Evetts) regarding his healthcare needs as needed.  Telephone call to patient's home number, no answer, left HIPAA compliant voicemail message for patient and patient's wife Paris [place]), requested call back. Telephone call to patient's wife mobile number ([phone] ), spoke with wife Tico Crotteau), stated patient's name, date of birth, and address.   Discussed [place] MD referral follow up, wife voiced understanding, and is in agreement to follow up on patient's behalf.   Wife states she is currently on the golf course and requested call back at later complete screening assessment.   States she  is also in agreement to referral to [place] for patient medication assistance.      Objective: Per KPN (Knowledge Performance Now, point of care tool) and chart review, patient has had no recent hospitalizations or ED visits.  Patient has a history of diabetes, hypertension, AKI (acute kidney injury), Atrial fibrillation, Carotid artery stenosis, CHF (congestive heart failure), Hyperlipidemia, Salmonella bacteremia,Stroke, TIA (transient ischemic attack), and Urgency of urination.    Assessment: Received [facility] MD referral on 12/01/2018.  Referral source: Dr. Ronnie Doss.  Referral reason: Medication assistance for Tresiba, Trulicity, and Humalog. Diagnosis diabetes.     Screening follow up pending patient contact.      Plan: RNCM will send unsuccessful outreach  letter, [facility] pamphlet, will call patient for 3rd telephone outreach attempt within 4 business days, screening follow up, and will proceed with case closure within 10 business days  if no return call.     Melisssa Donner H. Annia Friendly, BSN, [place] Management [facility] Telephonic CM Phone: [phone] Fax: [phone]

## 2018-12-05 ENCOUNTER — Other Ambulatory Visit: Payer: Self-pay | Admitting: *Deleted

## 2018-12-05 NOTE — Patient Outreach (Signed)
[place] [facility]) Care Management  12/05/2018  [name] Mar 17, 1941 [id number]   Subjective: Received voicemail message from patient's wife Evan Stanley), states she is unable to complete screening today, unavailable will be playing golf, and requested call back on 12/06/2018. Patient has given this RNCM verbal consent in the past to speak with wife Evan Stanley) regarding his healthcare needs as needed.      Objective:Per KPN (Knowledge Performance Now, point of care tool) and chart review,patient has had no recent hospitalizations or ED visits. Patient has a history of diabetes, hypertension, AKI (acute kidney injury),Atrial fibrillation,Carotid artery stenosis,CHF (congestive heart failure),Hyperlipidemia,Salmonella bacteremia,Stroke,TIA (transient ischemic attack), andUrgency of urination.    Assessment: Received [facility] MD referral on 12/01/2018. Referral source: Dr. Ronnie Doss. Referral reason: Medication assistance for Tresiba, Trulicity, and Humalog. Diagnosis diabetes. Screening follow up pending patient contact.      Plan:RNCM will send unsuccessful outreach letter, [facility] pamphlet, will call patient for 3rd telephone outreach attempt within 4 business days, screening follow up, and will proceed with case closure within 10 business days if no return call.     Juriel Cid H. Annia Friendly, BSN, [place] Management [facility] Telephonic CM Phone: [phone] Fax: [phone]

## 2018-12-06 ENCOUNTER — Other Ambulatory Visit: Payer: Self-pay | Admitting: *Deleted

## 2018-12-06 NOTE — Patient Outreach (Addendum)
[place] [name]) Care Management  12/06/2018  Horace Lukas 13-Apr-1941 [id number]   Subjective:  Per patient's previous consent,  RNCM given verbal consent to speak with patient's wife Dracen Reigle) regarding patient's healthcare needs as needed. Telephone call to patient's home  number, spoke with patient's wife Frances Joynt), she stated patient's name, date of birth, and address. .  Discussed [place] MD referral follow up, wife voiced understanding, and is in agreement to follow up on patient's behalf.  Wife states she is aware of MD referral, patient is having trouble affording the following medications [name], Trulicity, and Humalog) and is in agreement to referral to [place] for medication assistance.  Wife is aware that patient will continue to remain active with Chronic Care Management through [place], aware this RNCM will send in basket update message to practice nurse care manager and social worker regarding referral, wife voices understanding, is in agreement,   Wife states patient is able to manage self care and has assistance as needed.  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  Wife voices understanding of patient's medical diagnosis and treatment plan.   Wife states patient does not have any education material, transition of care, transportation, or community resource needs at this time.  States she is very appreciative of the follow up and is in agreement to receive [place] Management services.    Objective:Per KPN (Knowledge Performance Now, point of care tool) and chart review,patient has had no recent hospitalizations or ED visits. Patient has a history of diabetes, hypertension, AKI (acute kidney injury),Atrial fibrillation,Carotid artery stenosis,CHF (congestive heart  failure),Hyperlipidemia,Salmonella bacteremia,Stroke,TIA (transient ischemic attack), andUrgency of urination.    Assessment: Received [facility] MD referral on 12/01/2018. Referral source: Dr. Ronnie Doss. Referral reason: Medication assistance for Tresiba, Trulicity, and Humalog. Diagnosis diabetes. Screening follow up completed and patient will be referred to [place] for [name], Trulicity, and Humalog medication assistance.      Plan:.RNCM will refer patient to [place] for  [name], Trulicity, and Humalog medication assistance.  RNCM will send in basket update regarding referral to practice nurse care manager (Kristen And[place]ia) and social worker Rayford Halsted [place]).       Trystin Terhune H. Annia Friendly, BSN, [place] Management [facility] Telephonic CM Phone: [phone] Fax: [phone]

## 2018-12-07 DIAGNOSIS — H43811 Vitreous degeneration, right eye: Secondary | ICD-10-CM | POA: Diagnosis not present

## 2018-12-07 DIAGNOSIS — H4052X3 Glaucoma secondary to other eye disorders, left eye, severe stage: Secondary | ICD-10-CM | POA: Diagnosis not present

## 2018-12-07 DIAGNOSIS — H34232 Retinal artery branch occlusion, left eye: Secondary | ICD-10-CM | POA: Diagnosis not present

## 2018-12-07 DIAGNOSIS — E119 Type 2 diabetes mellitus without complications: Secondary | ICD-10-CM | POA: Diagnosis not present

## 2018-12-07 LAB — HM DIABETES EYE EXAM

## 2018-12-09 ENCOUNTER — Ambulatory Visit: Payer: Self-pay | Admitting: Pharmacist

## 2018-12-09 ENCOUNTER — Other Ambulatory Visit: Payer: Self-pay | Admitting: Pharmacist

## 2018-12-09 ENCOUNTER — Ambulatory Visit: Payer: Medicare Other | Admitting: *Deleted

## 2018-12-09 DIAGNOSIS — E1122 Type 2 diabetes mellitus with diabetic chronic kidney disease: Secondary | ICD-10-CM

## 2018-12-09 DIAGNOSIS — I152 Hypertension secondary to endocrine disorders: Secondary | ICD-10-CM

## 2018-12-09 DIAGNOSIS — E1159 Type 2 diabetes mellitus with other circulatory complications: Secondary | ICD-10-CM

## 2018-12-09 NOTE — Chronic Care Management (AMB) (Signed)
  Chronic Care Management   Telephone Follow Up Note   12/09/2018 Name: Evan Stanley MRN: [id number] DOB: Dec 15, 1940  Referred by: [name], DO Reason for referral : Chronic Care Management (RN follow up)   Evan Stanley is a 78 y.o. year old male who is a primary care patient of [name], DO. The CCM team was consulted for assistance with chronic disease management and care coordination needs.    I placed a follow-up telephone call to Mr. Pileggi today but I was unable to speak with him.   Follow Up Plan The CM team will reach out to the patient again over the next 7 days.    Chong Sicilian, RN-BC, BSN Nurse Care Manager [facility] [phone]

## 2018-12-12 DIAGNOSIS — E1121 Type 2 diabetes mellitus with diabetic nephropathy: Secondary | ICD-10-CM | POA: Diagnosis not present

## 2018-12-12 DIAGNOSIS — I6522 Occlusion and stenosis of left carotid artery: Secondary | ICD-10-CM | POA: Diagnosis not present

## 2018-12-12 DIAGNOSIS — I2510 Atherosclerotic heart disease of native coronary artery without angina pectoris: Secondary | ICD-10-CM | POA: Insufficient documentation

## 2018-12-12 DIAGNOSIS — I10 Essential (primary) hypertension: Secondary | ICD-10-CM | POA: Diagnosis not present

## 2018-12-12 DIAGNOSIS — N183 Chronic kidney disease, stage 3 (moderate): Secondary | ICD-10-CM | POA: Diagnosis not present

## 2018-12-12 DIAGNOSIS — E119 Type 2 diabetes mellitus without complications: Secondary | ICD-10-CM | POA: Insufficient documentation

## 2018-12-13 ENCOUNTER — Other Ambulatory Visit: Payer: Self-pay | Admitting: Pharmacy Technician

## 2018-12-13 NOTE — Patient Outreach (Signed)
[place] [facility]) Care Management  12/13/2018  Yobani Schertzer Jan 05, 1941 [id number]                          Medication Assistance Referral  Referral From: [facility] RPh Jenne Pane  Medication/Company: Trulicity and Humalog / [employer] [place] Patient application portion:  Mailed Provider application portion: Faxed  to Dr. Dwyane Dee  Medication/Company: [name] / [employer] Patient application portion:  Mailed Provider application portion: Faxed  to Dr. Dwyane Dee   Follow up:  Will follow up with patient in 7-10 business days to confirm application(s) have been received.  [name] [place] Certified Pharmacy Technician [place] Management Direct Dial:[phone]

## 2018-12-14 NOTE — Patient Outreach (Addendum)
[place] [facility]) Care Management  [place]   12/09/2018  Vidit Boissonneault 05/16/1941 [id number]  Reason for referral: Medication Assistance  Referral source: [place] CCM RN Current insurance: [facility]  PMHx includes but not limited to:  DMT2, HTN, HLD, CKD, CAD  Outreach:  Successful telephone call with Patient's wife, Oren Barella.  HIPAA identifiers verified.  [name] is agreeable to participate in patient assistance programs and understands the requirements.  She is also agreeable to review medications telephonically.  She states they cannot afford: [name], Novolog & Trulicity.  Patient's next fill of Novolog is $800 as they are in the coverage gap.  She states patient is compliant with all medications.  Last A1c was 8.0 on 10/03/18.  His endocrinologist is Dr. [name].  She reports his FBG range has most recently been 90-160.  Reports patient's glucometer is functioning properly. Denies adverse events from medications.  Tolerating current medication regimen well.  Encouraged patient and wife to continue to be adherent to prescribed regimen.  Objective: The ASCVD Risk score Mikey Bussing DC Jr., et al., 2013) failed to calculate for the following reasons:   The valid total cholesterol range is 130 to 320 mg/dL  Lab Results  Component Value Date   CREATININE 1.96 (H) 10/03/2018   CREATININE 1.93 (H) 04/22/2018   CREATININE 1.81 (H) 02/28/2018    Lab Results  Component Value Date   HGBA1C 8.0 (A) 10/03/2018    Lipid Panel     Component Value Date/Time   CHOL 86 10/03/2018 1026   CHOL 100 08/23/2017 0850   TRIG 97.0 10/03/2018 1026   HDL 29.90 (L) 10/03/2018 1026   HDL 32 (L) 08/23/2017 0850   CHOLHDL 3 10/03/2018 1026   VLDL 19.4 10/03/2018 1026   LDLCALC 36 10/03/2018 1026   LDLCALC 49 08/23/2017 0850   LDLDIRECT 62 02/16/2017 1151    BP Readings from Last 3 Encounters:  11/29/18 132/76  11/23/18 132/76  10/03/18 122/62    No Known  Allergies  Medications Reviewed Today    Reviewed by Lavera Guise, RPH (Pharmacist) on 12/09/18 at 1520  Med List Status: <None>  Medication Order Taking? Sig Documenting Provider Last Dose Status Informant  amLODipine (NORVASC) 10 MG tablet [id number] Yes Take 1 tablet (10 mg total) by mouth daily. [name], DO Taking Active   ammonium lactate (AMLACTIN) 12 % cream [id number] Yes APPLY TO DRY SKIN ON BOTTOM OF FEET 2 TIMES A DAY [provider] Taking Active   aspirin EC 81 MG tablet [id number] Yes Take 1 tablet (81 mg total) by mouth daily. Belva Crome, MD Taking Active Self  atorvastatin (LIPITOR) 40 MG tablet [id number] Yes Take 1 tablet (40 mg total) by mouth daily. [name], DO Taking Active   atropine 1 % ophthalmic solution [id number] Yes Place 1 drop into the left eye daily.  [provider] Taking Active   cetirizine (ZYRTEC) 10 MG tablet [id number] Yes Take 10 mg by mouth daily. [provider] Taking Active   Dulaglutide (TRULICITY) 1.5 ZG/0.1VC SOPN [id number] Yes Inject 1.5 mg into the skin once a week. Inject 1.26m into the skin once weekly. [provider] Taking Active            Med Note (Blanca Friend JRoyce Macadamia  Fri Dec 09, 2018  3:15 PM) SAT  hydrochlorothiazide (HYDRODIURIL) 25 MG tablet [id number] Take 1 tablet (25 mg total) by mouth daily. GRonnie DossM,  DO Taking Active   Insulin Degludec (TRESIBA FLEXTOUCH) 200 UNIT/ML SOPN [id number] Yes Inject 80 Units into the skin daily. [name], MD Taking Active   insulin lispro [facility]) 100 UNIT/ML KwikPen [id number] Yes Inject into the skin. 4-5 Units at breakfast; 12-14 at supper [provider] Taking Active   Insulin Pen Needle (EASY TOUCH PEN NEEDLES) 31G X 5 MM MISC [id number] Yes USE FOUR DAILY TO INJECT INSULIN [name], DO Taking Active   latanoprost (XALATAN) 0.005 % ophthalmic solution [id number] Yes Place 1 drop into the left eye at  bedtime.  [provider] Taking Active Self  lisinopril (PRINIVIL,ZESTRIL) 40 MG tablet [id number] Yes Take 1 tablet (40 mg total) by mouth daily. [name], DO Taking Active   metoprolol succinate (TOPROL-XL) 100 MG 24 hr tablet [id number] Yes TAKE 1 TABLET BY MOUTH  DAILY WITH OR IMMEDIATELY  FOLLOWING A MEAL. [name], DO Taking Active   ONE TOUCH ULTRA TEST test strip [id number] Yes Use to check BG twice daily [name], DO Taking Active   [facility] LANCETS 14Q MISC [id number] Yes Use to check BG twice daily Timmothy Euler, MD Taking Active   vitamin C (ASCORBIC ACID) 500 MG tablet [id number] Yes Take 500 mg by mouth daily. [provider] Taking Active Self  vitamin E (VITAMIN E) 1000 UNIT capsule [id number] Yes Take 1,000 Units by mouth daily. [provider] Taking Active Self         Medication Assistance Findings:   Patient Assistance Programs:T Tresiba & Novolog made by [place] requirement met: Yes o Out-of-pocket prescription expenditure met:   Not Applicable - Patient has met application requirements to apply for this patient assistance program.     Trulicity made by [place] requirement met: Yes o Out-of-pocket prescription expenditure met:   Not Applicable - Patient has met application requirements to apply for this patient assistance program.     Plan: . I will route patient assistance letter to [place] technician who will coordinate patient assistance program application process for medications listed above.  [facility] pharmacy technician will assist with obtaining all required documents from both patient and provider(s) and submit application(s) once completed.    PLAN: -I will follow up as needed during patient assistance process.  Regina Eck, PharmD, [place]  [phone]

## 2018-12-15 DIAGNOSIS — L84 Corns and callosities: Secondary | ICD-10-CM | POA: Diagnosis not present

## 2018-12-15 DIAGNOSIS — B351 Tinea unguium: Secondary | ICD-10-CM | POA: Diagnosis not present

## 2018-12-15 DIAGNOSIS — E1151 Type 2 diabetes mellitus with diabetic peripheral angiopathy without gangrene: Secondary | ICD-10-CM | POA: Diagnosis not present

## 2018-12-15 DIAGNOSIS — M79676 Pain in unspecified toe(s): Secondary | ICD-10-CM | POA: Diagnosis not present

## 2018-12-16 ENCOUNTER — Telehealth: Payer: Medicare Other | Admitting: *Deleted

## 2018-12-19 ENCOUNTER — Other Ambulatory Visit: Payer: Self-pay | Admitting: Pharmacy Technician

## 2018-12-19 NOTE — Patient Outreach (Signed)
[facility]) Care Management  12/19/2018  [name] [birth year] [id number]   Incoming call from Evan Stanley stating she has made copies of required documents needed for patient assistance. She states that she will out in the mail today.  Will submit applications to companies once all documents have been received.  [name] [place] Certified Pharmacy Technician [place] Management Direct [phone]

## 2019-01-02 NOTE — Progress Notes (Signed)
Patient ID: Evan Stanley, male   DOB: [birth year], 78 y.o.   MRN: [id number]          Reason for Appointment: Follow-up for Type 2 Diabetes  Referring physician: Kenn File   History of Present Illness:          Date of diagnosis of type 2 diabetes mellitus: 2010       Background history:   He thinks he was diagnosed to have diabetes after his coronary bypass surgery and blood sugars are not very high He had been on metformin until about 05/2017 Also at some point had taken glimepiride Usually appears to have had A1c just above 7%  Recent history:   INSULIN regimen is: [place] 80  units daily, Humalog 4-5 with breakfast and 12-14 units at dinner     Non-insulin hypoglycemic drugs the patient is taking are: Trulicity 1.5 mg weekly  His A1c has been around 8% and now 7.1  Current management, blood sugar patterns and problems identified:  He was advised to take Humalog BEFORE breakfast and he has finally started doing this  Mostly eating cereal again at breakfast  He thinks that he is eating less carbohydrate at lunchtime and mostly some form of chicken, occasionally fried  However blood sugars are not being checked after breakfast and lunch and mostly after dinner  His blood sugars fluctuate significantly at all times including fasting although evening readings are more variable  No hypoglycemia  He is usually consistent with taking his Trulicity the same day of the week  Overall trying to keep his portions small  No nausea with Trulicity  Recently no weight change        Side effects from medications have been: None  Glucose monitoring:  done about 2 times a day         Glucometer: One Touch.       Blood Glucose readings by review of home monitor download show   PRE-MEAL Fasting Lunch Dinner Bedtime Overall  Glucose range:  85-206      Mean/median:  120     127+/-38   POST-MEAL PC Breakfast PC Lunch PC Dinner  Glucose range:    77-223  Mean/median:     150    Previous reading  PRE-MEAL Fasting Lunch Dinner Bedtime Overall  Glucose range:  94-222      Mean/median:  149     153   POST-MEAL PC Breakfast PC Lunch PC Dinner  Glucose range:    100-249  Mean/median:    180     Self-care: The diet that the patient has been following is: tries to limit sweets.     Meal times are:  Breakfast is at after 9 AM  Typical meal intake: Breakfast is cheerios at times, otherwise eggs            Dietician visit, most recent: 2019               Exercise:  Periodically when playing golf  Weight history:  Wt Readings from Last 3 Encounters:  01/03/19 186 lb 9.6 oz (84.6 kg)  11/29/18 185 lb 12.8 oz (84.3 kg)  11/23/18 185 lb 9.6 oz (84.2 kg)    Glycemic control:   Lab Results  Component Value Date   HGBA1C 7.1 (A) 01/03/2019   HGBA1C 8.0 (A) 10/03/2018   HGBA1C 8.1 (A) 07/06/2018   Lab Results  Component Value Date   MICROALBUR neg 11/28/2014   LDLCALC 36 10/03/2018   CREATININE  1.96 (H) 10/03/2018   No results found for: [facility]  Lab Results  Component Value Date   FRUCTOSAMINE 323 (H) 04/22/2018      Allergies as of 01/03/2019   No Known Allergies     Medication List       Accurate as of January 03, 2019 12:28 PM. If you have any questions, ask your nurse or doctor.        amLODipine 10 MG tablet Commonly known as: NORVASC Take 1 tablet (10 mg total) by mouth daily.   ammonium lactate 12 % cream Commonly known as: AMLACTIN APPLY TO DRY SKIN ON BOTTOM OF FEET 2 TIMES A DAY   aspirin EC 81 MG tablet Take 1 tablet (81 mg total) by mouth daily.   atorvastatin 40 MG tablet Commonly known as: LIPITOR Take 1 tablet (40 mg total) by mouth daily.   atropine 1 % ophthalmic solution Place 1 drop into the left eye daily.   cetirizine 10 MG tablet Commonly known as: ZYRTEC Take 10 mg by mouth daily.   HumaLOG KwikPen 100 UNIT/ML KwikPen Generic drug: insulin lispro Inject into the skin. 4-5 Units at  breakfast; 12-14 at supper   hydrochlorothiazide 25 MG tablet Commonly known as: HYDRODIURIL Take 1 tablet (25 mg total) by mouth daily.   Insulin Degludec 200 UNIT/ML Sopn Commonly known as: [place] FlexTouch Inject 80 Units into the skin daily.   Insulin Pen Needle 31G X 5 MM Misc Commonly known as: Easy Touch Pen Needles USE FOUR DAILY TO INJECT INSULIN   latanoprost 0.005 % ophthalmic solution Commonly known as: XALATAN Place 1 drop into the left eye at bedtime.   lisinopril 40 MG tablet Commonly known as: ZESTRIL Take 1 tablet (40 mg total) by mouth daily.   metoprolol succinate 100 MG 24 hr tablet Commonly known as: TOPROL-XL TAKE 1 TABLET BY MOUTH  DAILY WITH OR IMMEDIATELY  FOLLOWING A MEAL.   ONE TOUCH ULTRA TEST test strip Generic drug: glucose blood Use to check BG twice daily   OneTouch Delica Lancets 19E Misc Use to check BG twice daily   Trulicity 1.5 RD/4.0CX Sopn Generic drug: Dulaglutide Inject 1.5 mg into the skin once a week. Inject 1.77m into the skin once weekly.   vitamin C 500 MG tablet Commonly known as: ASCORBIC ACID Take 500 mg by mouth daily.   vitamin E 1000 UNIT capsule Generic drug: vitamin E Take 1,000 Units by mouth daily.       Allergies: No Known Allergies  Past Medical History:  Diagnosis Date  . AKI (acute kidney injury) ([facility]   . Allergy   . Anxiety   . Arthritis    Right shoulder  . Atrial fibrillation ([facility]   . Benign essential HTN   . Carotid artery stenosis   . Cataract   . CHF (congestive heart failure) ([facility]   . CVA (cerebral vascular accident) ([facility] 04/28/2016  . Decreased vision    left eye  . Diabetes mellitus without complication ([facility]    Takes Metformin  . Hyperlipidemia   . Hypertension   . Salmonella bacteremia 04/29/2016  . Sepsis ([place] 04/29/2016  . Stroke ([place] 11/18/2015   no deficits  . TIA (transient ischemic attack)    affected left eye  . Urgency of urination     Past Surgical  History:  Procedure Laterality Date  . AORTIC VALVE REPLACEMENT    . BACK SURGERY  80s  . CARDIAC VALVE REPLACEMENT  2010  . ENDARTERECTOMY  Right 01/13/2016   Procedure: ENDARTERECTOMY CAROTID - RIGHT;  Surgeon: Conrad [place]ang [place], MD;  Location: [facility];  Service: Vascular;  Laterality: Right;  . EYE SURGERY    . laser surgery, left eye  Left 10-29-2015    retinal surgery/ Deloria Lair MD   . PERIPHERAL VASCULAR CATHETERIZATION Right 11/18/2015   Procedure: Carotid Angiography;  Surgeon: Conrad E[place]ka, MD;  Location: [place] [facility];  Service: Cardiovascular;  Laterality: Right;  . PERIPHERAL VASCULAR CATHETERIZATION N/A 11/18/2015   Procedure: Aortic Arch Angiography;  Surgeon: Conrad [place], MD;  Location: [place] CV LAB;  Service: Cardiovascular;  Laterality: N/A;  . TEE WITHOUT CARDIOVERSION N/A 05/05/2016   Procedure: TRANSESOPHAGEAL ECHOCARDIOGRAM (TEE);  Surgeon: Lelon Perla, MD;  Location: [facility] ENDOSCOPY;  Service: Cardiovascular;  Laterality: N/A;    Family History  Problem Relation Age of Onset  . Heart disease Mother   . Cancer Mother        breast  . Stroke Father 20  . Hypertension Father   . Early death Sister        infant death  . Heart disease Brother   . Hydrocephalus Brother   . Heart disease Brother   . Diabetes Brother   . Cancer Brother        prostat  . Healthy Son   . Healthy Son     Social History:  reports that he quit smoking about 32 years ago. His smoking use included cigarettes. He has a 40.00 pack-year smoking history. He has never used smokeless tobacco. He reports that he does not drink alcohol or use drugs.   Review of Systems   Lipid history: Currently on atorvastatin 40 mg daily prescribed by his PCP Has not had any recent follow-up Has history of CAD and CVD    Lab Results  Component Value Date   CHOL 86 10/03/2018   HDL 29.90 (L) 10/03/2018   LDLCALC 36 10/03/2018   LDLDIRECT 62 02/16/2017   TRIG 97.0 10/03/2018   CHOLHDL 3  10/03/2018           Hypertension:  On multiple drugs including hydrochlorothiazide 25 mg and lisinopril 40 mg daily, Norvasc 10 mg daily prescribed by PCP  BP Readings from Last 3 Encounters:  01/03/19 120/62  11/29/18 132/76  11/23/18 132/76    Most recent eye exam was in 7/18  Most recent foot exam: 7/19    LABS:  Office Visit on 01/03/2019  Component Date Value Ref Range Status  . Hemoglobin A1C 01/03/2019 7.1* 4.0 - 5.6 % Final    Physical Examination:  BP 120/62 (BP Location: Left Arm, Patient Position: Sitting, Cuff Size: Normal)   Pulse 71   Ht 5' 3"  (1.6 m)   Wt 186 lb 9.6 oz (84.6 kg)   SpO2 98%   BMI 33.05 kg/m       ASSESSMENT:  Diabetes type 2, on insulin  See history of present illness for detailed discussion of current diabetes management, blood sugar patterns and problems identified  His A1c has improved to 7.1  He has done better with taking insulin at breakfast also and likely has overall better readings with increasing his suppertime coverage Fasting readings are mostly fairly good without hypoglycemia    HYPERTENSION: Well controlled  Nephrology consultation: He is asking about need to do this and advised him to keep his appointments as scheduled by his PCP  PLAN:    Discussed when to check his blood sugar by  rotation at different times   Since he is usually not able to judge how much to adjust his insulin we will continue the same dose  However he can let us know if his fasting readings start to get low below 80-90  More regular walking  Discussed checking blood sugars sometimes after breakfast and lunch also and targets of under 160 ideally  No change in Trulicity  Patient Instructions  Check blood sugars on waking up days a week  Also check blood sugars about 2 hours after meals and do this after different meals by rotation Some after lunch  Recommended blood sugar levels on waking up are 90-130 and about 2 hours after  meal is 130-160  Please bring your blood sugar monitor to each visit, thank you        [name] 01/03/2019, 12:28 PM   Note: This office note was prepared with Dragon voice recognition system technology. Any transcriptional errors that result from this process are unintentional.

## 2019-01-03 ENCOUNTER — Encounter: Payer: Self-pay | Admitting: Endocrinology

## 2019-01-03 ENCOUNTER — Ambulatory Visit: Payer: Medicare Other | Admitting: Endocrinology

## 2019-01-03 ENCOUNTER — Other Ambulatory Visit: Payer: Self-pay

## 2019-01-03 VITALS — BP 120/62 | HR 71 | Ht 63.0 in | Wt 186.6 lb

## 2019-01-03 DIAGNOSIS — Z794 Long term (current) use of insulin: Secondary | ICD-10-CM

## 2019-01-03 DIAGNOSIS — E1165 Type 2 diabetes mellitus with hyperglycemia: Secondary | ICD-10-CM

## 2019-01-03 LAB — POCT GLYCOSYLATED HEMOGLOBIN (HGB A1C): Hemoglobin A1C: 7.1 % — AB (ref 4.0–5.6)

## 2019-01-03 NOTE — Patient Instructions (Signed)
Check blood sugars on waking up days a week  Also check blood sugars about 2 hours after meals and do this after different meals by rotation Some after lunch  Recommended blood sugar levels on waking up are 90-130 and about 2 hours after meal is 130-160  Please bring your blood sugar monitor to each visit, thank you

## 2019-01-05 ENCOUNTER — Other Ambulatory Visit (HOSPITAL_COMMUNITY): Payer: Self-pay | Admitting: Nephrology

## 2019-01-05 ENCOUNTER — Other Ambulatory Visit: Payer: Self-pay | Admitting: Nephrology

## 2019-01-05 DIAGNOSIS — N183 Chronic kidney disease, stage 3 unspecified: Secondary | ICD-10-CM

## 2019-01-13 ENCOUNTER — Other Ambulatory Visit: Payer: Self-pay

## 2019-01-13 ENCOUNTER — Ambulatory Visit (HOSPITAL_COMMUNITY)
Admission: RE | Admit: 2019-01-13 | Discharge: 2019-01-13 | Disposition: A | Discharge status: Home / Self Care | Payer: Medicare Other | Source: Ambulatory Visit | Attending: Family Medicine | Admitting: Family Medicine

## 2019-01-13 ENCOUNTER — Encounter: Payer: Self-pay | Admitting: Family Medicine

## 2019-01-13 ENCOUNTER — Ambulatory Visit (INDEPENDENT_AMBULATORY_CARE_PROVIDER_SITE_OTHER): Payer: Medicare Other | Admitting: Family Medicine

## 2019-01-13 DIAGNOSIS — R42 Dizziness and giddiness: Secondary | ICD-10-CM

## 2019-01-13 DIAGNOSIS — R2689 Other abnormalities of gait and mobility: Secondary | ICD-10-CM | POA: Diagnosis not present

## 2019-01-13 DIAGNOSIS — I6522 Occlusion and stenosis of left carotid artery: Secondary | ICD-10-CM | POA: Diagnosis not present

## 2019-01-13 DIAGNOSIS — H814 Vertigo of central origin: Secondary | ICD-10-CM | POA: Insufficient documentation

## 2019-01-13 MED ORDER — MECLIZINE HCL 25 MG PO TABS: 12.5000 mg | ORAL_TABLET | Freq: Three times a day (TID) | ORAL | 0 refills | Status: DC | PRN

## 2019-01-13 NOTE — Addendum Note (Signed)
Addended by: Baruch Gouty on: 01/13/2019 03:18 PM   Modules accepted: Orders

## 2019-01-13 NOTE — Progress Notes (Addendum)
Virtual Visit via telephone Note Due to COVID-19, visit is conducted virtually and was requested by patient. This visit type was conducted due to national recommendations for restrictions regarding the COVID-19 Pandemic (e.g. social distancing) in an effort to limit this patient's exposure and mitigate transmission in our community. All issues noted in this document were discussed and addressed.  A physical exam was not performed with this format.   I connected with Evan Stanley and his wife on 01/13/19 at 8 by telephone and verified that I am speaking with the correct person using two identifiers. Evan Stanley is currently located at home and family is currently with them during visit. The provider, Monia Pouch, FNP is located in their office at time of visit.  I discussed the limitations, risks, security and privacy concerns of performing an evaluation and management service by telephone and the availability of in person appointments. I also discussed with the patient that there may be a patient responsible charge related to this service. The patient expressed understanding and agreed to proceed.  Subjective:  Patient ID: Evan Stanley, male    DOB: 25-Sep-1940, 78 y.o.   MRN: [id number]  Chief Complaint:  Dizziness   HPI: Evan Stanley is a 78 y.o. male presenting on 01/13/2019 for Dizziness   Pts wife and pt report intermittent dizziness for over a month. States last night this dizziness became persistent. States he has a lack of balance, general weakness, and nausea with the dizziness. He states the dizziness and loss of balance is constant. He does have a significant history of previous CVA and carotid artery stenosis. Pt states he feels this is inner ear related. States he does not feel like he did when he had his previous stroke. He denies tinnitus, slurred speech, facial droop, or focal deficits. No chest pain, shortness of breath, or confusion.     Relevant past medical, surgical,  family, and social history reviewed and updated as indicated.  Allergies and medications reviewed and updated.   Past Medical History:  Diagnosis Date  . AKI (acute kidney injury) ([place])   . Allergy   . Anxiety   . Arthritis    Right shoulder  . Atrial fibrillation ([place])   . Benign essential HTN   . Carotid artery stenosis   . Cataract   . CHF (congestive heart failure) ([place])   . CVA (cerebral vascular accident) ([place]) 04/28/2016  . Decreased vision    left eye  . Diabetes mellitus without complication ([place])    Takes Metformin  . Hyperlipidemia   . Hypertension   . Salmonella bacteremia 04/29/2016  . Sepsis ([place]) 04/29/2016  . Stroke ([place]) 11/18/2015   no deficits  . TIA (transient ischemic attack)    affected left eye  . Urgency of urination     Past Surgical History:  Procedure Laterality Date  . AORTIC VALVE REPLACEMENT    . BACK SURGERY  80s  . CARDIAC VALVE REPLACEMENT  2010  . ENDARTERECTOMY Right 01/13/2016   Procedure: ENDARTERECTOMY CAROTID - RIGHT;  Surgeon: Conrad N[place], MD;  Location: [place];  Service: Vascular;  Laterality: Right;  . EYE SURGERY    . laser surgery, left eye  Left 10-29-2015    retinal surgery/ Deloria Lair MD   . PERIPHERAL VASCULAR CATHETERIZATION Right 11/18/2015   Procedure: Carotid Angiography;  Surgeon: Conrad Girdletree, MD;  Location: [place] [facility] LAB;  Service: Cardiovascular;  Laterality: Right;  . PERIPHERAL VASCULAR CATHETERIZATION N/A 11/18/2015   Procedure:  Aortic Arch Angiography;  Surgeon: Conrad B[place], MD;  Location: [place] CV LAB;  Service: Cardiovascular;  Laterality: N/A;  . TEE WITHOUT CARDIOVERSION N/A 05/05/2016   Procedure: TRANSESOPHAGEAL ECHOCARDIOGRAM (TEE);  Surgeon: Lelon Perla, MD;  Location: [facility] ENDOSCOPY;  Service: Cardiovascular;  Laterality: N/A;    Social History   Socioeconomic History  . Marital status: Married    Spouse name: Not on file  . Number of children: 2  . Years of education: 33  . Highest  education level: 11th grade  Occupational History  . Occupation: maintenance    Employer: [employer]    Comment: Retired  Scientific laboratory technician  . Financial resource strain: Not hard at all  . Food insecurity    Worry: Never true    Inability: Never true  . Transportation needs    Medical: No    Non-medical: No  Tobacco Use  . Smoking status: Former Smoker    Packs/day: 2.00    Years: 20.00    Pack years: 40.00    Types: Cigarettes    Quit date: 07/20/1986    Years since quitting: 32.5  . Smokeless tobacco: Never Used  Substance and Sexual Activity  . Alcohol use: No    Alcohol/week: 0.0 standard drinks  . Drug use: No  . Sexual activity: Yes  Lifestyle  . Physical activity    Days per week: 3 days    Minutes per session: 30 min  . Stress: Only a little  Relationships  . Social connections    Talks on phone: More than three times a week    Gets together: More than three times a week    Attends religious service: Never    Active member of club or organization: Yes    Attends meetings of clubs or organizations: More than 4 times per year    Relationship status: Married  . Intimate partner violence    Fear of current or ex partner: No    Emotionally abused: No    Physically abused: No    Forced sexual activity: No  Other Topics Concern  . Not on file  Social History Narrative  . Not on file    Outpatient Encounter Medications as of 01/13/2019  Medication Sig  . amLODipine (NORVASC) 10 MG tablet Take 1 tablet (10 mg total) by mouth daily.  Marland Kitchen ammonium lactate (AMLACTIN) 12 % cream APPLY TO DRY SKIN ON BOTTOM OF FEET 2 TIMES A DAY  . aspirin EC 81 MG tablet Take 1 tablet (81 mg total) by mouth daily.  Marland Kitchen atorvastatin (LIPITOR) 40 MG tablet Take 1 tablet (40 mg total) by mouth daily.  Marland Kitchen atropine 1 % ophthalmic solution Place 1 drop into the left eye daily.   . cetirizine (ZYRTEC) 10 MG tablet Take 10 mg by mouth daily.  . Dulaglutide (TRULICITY) 1.5 UO/3.7GB SOPN Inject 1.5 mg into  the skin once a week. Inject 1.3m into the skin once weekly.  . hydrochlorothiazide (HYDRODIURIL) 25 MG tablet Take 1 tablet (25 mg total) by mouth daily.  . Insulin Degludec (TRESIBA FLEXTOUCH) 200 UNIT/ML SOPN Inject 80 Units into the skin daily.  . insulin lispro (HUMALOG KWIKPEN) 100 UNIT/ML KwikPen Inject into the skin. 4-5 Units at breakfast; 12-14 at supper  . Insulin Pen Needle (EASY TOUCH PEN NEEDLES) 31G X 5 MM MISC USE FOUR DAILY TO INJECT INSULIN  . latanoprost (XALATAN) 0.005 % ophthalmic solution Place 1 drop into the left eye at bedtime.   .Marland Kitchenlisinopril (  PRINIVIL,ZESTRIL) 40 MG tablet Take 1 tablet (40 mg total) by mouth daily.  . metoprolol succinate (TOPROL-XL) 100 MG 24 hr tablet TAKE 1 TABLET BY MOUTH  DAILY WITH OR IMMEDIATELY  FOLLOWING A MEAL.  Marland Kitchen ONE TOUCH ULTRA TEST test strip Use to check BG twice daily  . ONETOUCH DELICA LANCETS 63Z MISC Use to check BG twice daily  . vitamin C (ASCORBIC ACID) 500 MG tablet Take 500 mg by mouth daily.  . vitamin E (VITAMIN E) 1000 UNIT capsule Take 1,000 Units by mouth daily.   No facility-administered encounter medications on file as of 01/13/2019.     No Known Allergies  Review of Systems  Constitutional: Negative for chills, fatigue and fever.  Eyes: Negative for photophobia and visual disturbance.  Respiratory: Negative for cough, chest tightness and shortness of breath.   Cardiovascular: Negative for chest pain, palpitations and leg swelling.  Gastrointestinal: Positive for nausea. Negative for vomiting.  Musculoskeletal: Positive for gait problem. Negative for neck pain and neck stiffness.  Skin: Negative for color change.  Neurological: Positive for weakness and light-headedness. Negative for dizziness, tremors, seizures, syncope, facial asymmetry, speech difficulty, numbness and headaches.  Psychiatric/Behavioral: Negative for confusion.  All other systems reviewed and are negative.        Observations/Objective: No  vital signs or physical exam, this was a telephone or virtual health encounter.  Pt alert and oriented, answers all questions appropriately, and able to speak in full sentences.    Assessment and Plan: Janelle was seen today for dizziness.  Diagnoses and all orders for this visit:  Dizziness Loss of balance Vertigo of central origin Due to persistent dizziness, loss of balance, and nausea I am concerned about a neurovascular event. Reported symptoms concerning for CVA, pt feels this is due to inner ear. Symptoms not consistent with meniere's disease due to continuous nature and lack of hearing loss or tinnitus. Pt refused to go to the ED but did agree to have a stat MRI. MRI order and pt to go to [facility] to have study completed.  -     MR Brain Wo Contrast; Future     Follow Up Instructions: Return in about 1 week (around 01/20/2019), or if symptoms worsen or fail to improve, for Dizziness.    I discussed the assessment and treatment plan with the patient. The patient was provided an opportunity to ask questions and all were answered. The patient agreed with the plan and demonstrated an understanding of the instructions.   The patient was advised to call back or seek an in-person evaluation if the symptoms worsen or if the condition fails to improve as anticipated.  The above assessment and management plan was discussed with the patient. The patient verbalized understanding of and has agreed to the management plan. Patient is aware to call the clinic if symptoms persist or worsen. Patient is aware when to return to the clinic for a follow-up visit. Patient educated on when it is appropriate to go to the emergency department.    I provided 25 minutes of non-face-to-face time during this encounter. The call started at 1210. The call ended at 1235. The other time was used for coordination of care.    Monia Pouch, FNP-C [place] [address]  [place], [place] [address] [phone]    01/13/2019 1515  MRI negative for acute changes. Will trial meclizine for the dizziness and nausea. Pt to follow up in 1 week for reevaluation. Pt  aware of symptoms that require emergent evaluation. May need referral to neurology if dizziness persists.  Pt and wife verbalize understanding.   Monia Pouch, FNP-C [facility] [address] [place], [place] [address] [phone]

## 2019-01-23 ENCOUNTER — Other Ambulatory Visit: Payer: Self-pay | Admitting: Pharmacy Technician

## 2019-01-23 NOTE — Patient Outreach (Signed)
[facility]) Care Management  01/23/2019  Evan Stanley [birth year] [id number]   Received patient portion(s) of patient assistance application for Tresiba, Trulicity and Humalog. Faxed completed application and required documents into [employer] and [employer].  Will follow up with company in 3-5 business days to check status of application.  [name] [place] Certified Pharmacy Technician [place] Management Direct [phone]

## 2019-01-25 ENCOUNTER — Other Ambulatory Visit: Payer: Self-pay | Admitting: Pharmacy Technician

## 2019-01-25 NOTE — Patient Outreach (Signed)
[facility]) Care Management  01/25/2019  Exavior Kimmons [birth year] [id number]   Follow up call placed to [facility] regarding patient assistance application(s) for Humalog and Trulicity , Jonelle Sidle confirms patient has been approved as of 7/7 until 07/20/19. Faxed script portions of application to [facility], will follow up in 2-3 business days to check shipment status.   Follow up call placed to [employer] regarding patient assistance application(s) for Amado Coe confirms patient has been approved as of 7/8 until 06/19/19. Medication to arrive at providers office in 1-14 business days.  Follow up:  Will follow up with patient once update has been confirmed with [facility].  [name] [place] Certified Pharmacy Technician [place] Management Direct Dial:[phone]

## 2019-01-26 ENCOUNTER — Ambulatory Visit (HOSPITAL_COMMUNITY)
Admission: RE | Admit: 2019-01-26 | Discharge: 2019-01-26 | Disposition: A | Discharge status: Home / Self Care | Payer: Medicare Other | Source: Ambulatory Visit | Attending: Nephrology | Admitting: Nephrology

## 2019-01-26 ENCOUNTER — Other Ambulatory Visit: Payer: Self-pay

## 2019-01-26 DIAGNOSIS — N183 Chronic kidney disease, stage 3 unspecified: Secondary | ICD-10-CM

## 2019-01-27 ENCOUNTER — Telehealth: Payer: Self-pay

## 2019-01-27 ENCOUNTER — Other Ambulatory Visit: Payer: Self-pay | Admitting: Pharmacy Technician

## 2019-01-27 NOTE — Patient Outreach (Signed)
[facility]) Care Management  01/27/2019  Evan Stanley [birth year] [id number]    Follow up call placed to [name]) regarding patient assistance application(s) for Humalog and Trulicity , [name] confirms that scripts have been received and medication is ready be shipped.   Unsuccessful call #1 placed to patients wife regarding patient assistance update for Trulicity and Humalog, HIPAA compliant voicemail left.   Follow up:  Will make 2nd call attempt in 2-3 business days if call has not been returned.  [name] [place] Certified Pharmacy Technician [place] Management Direct Dial:[phone]

## 2019-01-27 NOTE — Telephone Encounter (Signed)
Received fax from [employer] patient assistance program stating that the patient has been approved for [employer] patient assistance program through June 19, 2019, as long as the pt continues to meet the program eligibility requirements.

## 2019-01-31 ENCOUNTER — Telehealth: Payer: Self-pay | Admitting: *Deleted

## 2019-01-31 ENCOUNTER — Other Ambulatory Visit: Payer: Self-pay | Admitting: Pharmacy Technician

## 2019-01-31 NOTE — Patient Outreach (Signed)
[facility]) Care Management  01/31/2019  Evan Stanley 10/16/1940 [id number]    Incoming call from patients wife regarding patient assistance update for Tresiba, Humalog and Trulicity, HIPAA identifiers verified. Informed Mrs. Zinger about medication approvals. Provided her with [facility] [employer]) phone number so that she can call and set up patients shipment of Humalog and Trulicity. Informed her that [place] thru [employer] will be shipped to his providers office.  Follow up:  Will follow up with patient in 7-10 business days to confirm medications have been received.  [name] [place] Certified Pharmacy Technician [place] Management Direct Dial:[phone]

## 2019-02-01 ENCOUNTER — Other Ambulatory Visit: Payer: Medicare Other

## 2019-02-01 ENCOUNTER — Telehealth: Payer: Self-pay

## 2019-02-01 ENCOUNTER — Telehealth: Payer: Self-pay | Admitting: Endocrinology

## 2019-02-01 ENCOUNTER — Other Ambulatory Visit: Payer: Self-pay

## 2019-02-01 ENCOUNTER — Encounter (HOSPITAL_COMMUNITY): Payer: Medicare Other

## 2019-02-01 DIAGNOSIS — Z1159 Encounter for screening for other viral diseases: Secondary | ICD-10-CM | POA: Diagnosis not present

## 2019-02-01 DIAGNOSIS — Z79899 Other long term (current) drug therapy: Secondary | ICD-10-CM | POA: Diagnosis not present

## 2019-02-01 DIAGNOSIS — E559 Vitamin D deficiency, unspecified: Secondary | ICD-10-CM | POA: Diagnosis not present

## 2019-02-01 DIAGNOSIS — I10 Essential (primary) hypertension: Secondary | ICD-10-CM | POA: Diagnosis not present

## 2019-02-01 DIAGNOSIS — N183 Chronic kidney disease, stage 3 (moderate): Secondary | ICD-10-CM | POA: Diagnosis not present

## 2019-02-01 DIAGNOSIS — R809 Proteinuria, unspecified: Secondary | ICD-10-CM | POA: Diagnosis not present

## 2019-02-01 DIAGNOSIS — D509 Iron deficiency anemia, unspecified: Secondary | ICD-10-CM | POA: Diagnosis not present

## 2019-02-01 NOTE — Telephone Encounter (Signed)
Attempted to call [facility] and clarify this, but wait time was over 1 hour. Will attempt to call back later.

## 2019-02-01 NOTE — Telephone Encounter (Signed)
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   Ph # [phone]  Please Advise, Thanks

## 2019-02-01 NOTE — Telephone Encounter (Signed)
Attempted to call again and current wait time is 92 minutes. Will attempt again later.

## 2019-02-01 NOTE — Telephone Encounter (Signed)
Received shipment of Tresiba pens from [employer] patient assistance program for the pt. Received was 6 boxes of [place]. Called pt and left detailed voicemail for pt.

## 2019-02-02 ENCOUNTER — Ambulatory Visit: Payer: Medicare Other | Admitting: Family

## 2019-02-02 NOTE — Telephone Encounter (Signed)
Pt's wife stated that patient assistance had requested that she obtain information pertaining to the MD's license and other credentials, and the pt was informed that we do not give this information to patient, and if [facility] needs this information, they have our contact information and they can reach out to [place].

## 2019-02-08 NOTE — Telephone Encounter (Signed)
Received shipment from [employer] patient assistance consisting of 2 boxes of pen needles. Pt was called and made aware. He verbalized understanding.

## 2019-02-14 ENCOUNTER — Other Ambulatory Visit: Payer: Self-pay | Admitting: Pharmacist

## 2019-02-14 ENCOUNTER — Other Ambulatory Visit: Payer: Self-pay | Admitting: Pharmacy Technician

## 2019-02-14 DIAGNOSIS — I10 Essential (primary) hypertension: Secondary | ICD-10-CM | POA: Diagnosis not present

## 2019-02-14 DIAGNOSIS — E1121 Type 2 diabetes mellitus with diabetic nephropathy: Secondary | ICD-10-CM | POA: Diagnosis not present

## 2019-02-14 DIAGNOSIS — Z954 Presence of other heart-valve replacement: Secondary | ICD-10-CM | POA: Diagnosis not present

## 2019-02-14 DIAGNOSIS — N183 Chronic kidney disease, stage 3 (moderate): Secondary | ICD-10-CM | POA: Diagnosis not present

## 2019-02-14 NOTE — Patient Outreach (Signed)
[facility] [facility]) Care Management  02/14/2019  Grayson Pfefferle Feb 17, 1941 [id number]   Incoming call from patients wife, [name], [place] identifiers verified. Mrs. Fellman confirms that patient has received 4 month supplies of Tresiba from [employer] and Trulicity & Humalog from [employer]. Reviewed with her how to obtain refills from companies if needed.   Will route note to [place] for case closure.  [name] [place] Certified Pharmacy Technician [place] Management Direct Dial:[phone]

## 2019-02-14 NOTE — Patient Outreach (Signed)
[facility]) Care Management [place]  02/14/2019  Lonn Im Apr 08, 1941 [id number]  Reason for referral: medication assistance  [facility] pharmacy case is being closed due to the following reasons:  -Goals of care have been met.  Patient assistance has been obtained.  Patient's A1c has decreased from 80.% to 7.1%!  Medication access will no longer be an issue through the end of the year.  Encouraged wife to call [place] if additional needs are identified.   Regina Eck, PharmD, [place]  [phone]

## 2019-03-04 ENCOUNTER — Other Ambulatory Visit: Payer: Self-pay | Admitting: Endocrinology

## 2019-03-30 DIAGNOSIS — B351 Tinea unguium: Secondary | ICD-10-CM | POA: Diagnosis not present

## 2019-03-30 DIAGNOSIS — M79676 Pain in unspecified toe(s): Secondary | ICD-10-CM | POA: Diagnosis not present

## 2019-03-30 DIAGNOSIS — E1151 Type 2 diabetes mellitus with diabetic peripheral angiopathy without gangrene: Secondary | ICD-10-CM | POA: Diagnosis not present

## 2019-03-30 DIAGNOSIS — L84 Corns and callosities: Secondary | ICD-10-CM | POA: Diagnosis not present

## 2019-03-31 ENCOUNTER — Other Ambulatory Visit: Payer: Self-pay

## 2019-04-03 ENCOUNTER — Other Ambulatory Visit: Payer: Self-pay

## 2019-04-03 ENCOUNTER — Encounter: Payer: Self-pay | Admitting: Family Medicine

## 2019-04-03 ENCOUNTER — Ambulatory Visit (INDEPENDENT_AMBULATORY_CARE_PROVIDER_SITE_OTHER): Payer: Medicare Other | Admitting: Family Medicine

## 2019-04-03 VITALS — BP 97/48 | HR 73 | Temp 99.0°F | Resp 18 | Ht 63.0 in | Wt 181.0 lb

## 2019-04-03 DIAGNOSIS — E1122 Type 2 diabetes mellitus with diabetic chronic kidney disease: Secondary | ICD-10-CM | POA: Diagnosis not present

## 2019-04-03 DIAGNOSIS — E1169 Type 2 diabetes mellitus with other specified complication: Secondary | ICD-10-CM | POA: Diagnosis not present

## 2019-04-03 DIAGNOSIS — E1159 Type 2 diabetes mellitus with other circulatory complications: Secondary | ICD-10-CM

## 2019-04-03 DIAGNOSIS — I10 Essential (primary) hypertension: Secondary | ICD-10-CM

## 2019-04-03 DIAGNOSIS — I152 Hypertension secondary to endocrine disorders: Secondary | ICD-10-CM

## 2019-04-03 DIAGNOSIS — N183 Chronic kidney disease, stage 3 (moderate): Secondary | ICD-10-CM

## 2019-04-03 DIAGNOSIS — E785 Hyperlipidemia, unspecified: Secondary | ICD-10-CM | POA: Diagnosis not present

## 2019-04-03 LAB — BAYER DCA HB A1C WAIVED: HB A1C (BAYER DCA - WAIVED): 6.2 % (ref <7.0)

## 2019-04-03 NOTE — Progress Notes (Signed)
Subjective: CC: DM2, HTN, HLD, CKD3 PCP: [name], DO XFG:HWEXHB Evan Stanley is a 78 y.o. male presenting to clinic today for:  1. Type 2 Diabetes w/ HTN, HLD, CKD3:  Patient reports compliance with Norvasc 10, Lisinopril 40, HCTZ 67m, Metoprol XL 1064m Lipitor 408m He is seen by Dr. KumDwyane Deeth endocrinology and notes that he takes 80 units of Tresiba daily with 5 and 14 units of Humalog with meals.  He is also on once weekly Trulicity.   He notes that blood pressures are running around 120716Rstolics over 50s67E 60s93Yastolics. Fasting blood sugars have run anywhere between 70 and 170 with a high of 172 this morning.  He has follow-up with Dr. KumDwyane Deeon.   He sees Dr. BefLowanda Fosterr kidneys and states that he only has to see him once yearly.   Sees Dr. DraIrving Showsr podiatry regularly for nail cutting. Sees Dr. HenDaneen Schickr cardiology checkup every year.  Last eye exam: UTD Last foot exam: needs Last A1c:  Lab Results  Component Value Date   HGBA1C 7.1 (A) 01/03/2019   Nephropathy screen indicated?: on ACE-I Last flu, zoster and/or pneumovax: Flu vaccine Immunization History  Administered Date(s) Administered  . Influenza Whole 05/26/2010  . Influenza, High Dose Seasonal PF 05/20/2017, 05/23/2018  . Influenza,inj,Quad PF,6+ Mos 06/14/2013, 04/16/2014, 06/18/2015  . Influenza-Unspecified 05/14/2016  . Pneumococcal Conjugate-13 06/18/2015  . Pneumococcal Polysaccharide-23 12/16/2016  . Tdap 03/05/2011    ROS: Denies dizziness, LOC, polyuria, polydipsia, unintended weight loss/gain, foot ulcerations, numbness or tingling in extremities, shortness of breath or chest pain.   No Known Allergies Past Medical History:  Diagnosis Date  . AKI (acute kidney injury) ([facility]CC[place] . Allergy   . Anxiety   . Arthritis    Right shoulder  . Atrial fibrillation ([place]CC[place] . Benign essential HTN   . Carotid artery stenosis   . Cataract   . CHF (congestive heart failure) ([facility]CC[place] .  CVA (cerebral vascular accident) (HCCTow[place]ensing Trails0/04/2016  . Decreased vision    left eye  . Diabetes mellitus without complication (HCC[place]  Takes Metformin  . Hyperlipidemia   . Hypertension   . Salmonella bacteremia 04/29/2016  . Sepsis ([facility]CC[place]0/05/2016  . Stroke ([place]CC[place]/07/2015   no deficits  . TIA (transient ischemic attack)    affected left eye  . Urgency of urination     Current Outpatient Medications:  .  amLODipine (NORVASC) 10 MG tablet, Take 1 tablet (10 mg total) by mouth daily., Disp: 90 tablet, Rfl: 3 .  ammonium lactate (AMLACTIN) 12 % cream, APPLY TO DRY SKIN ON BOTTOM OF FEET 2 TIMES A DAY, Disp: , Rfl: 4 .  aspirin EC 81 MG tablet, Take 1 tablet (81 mg total) by mouth daily., Disp: 90 tablet, Rfl: 3 .  atorvastatin (LIPITOR) 40 MG tablet, Take 1 tablet (40 mg total) by mouth daily., Disp: 90 tablet, Rfl: 3 .  atropine 1 % ophthalmic solution, Place 1 drop into the left eye daily. , Disp: , Rfl: 6 .  cetirizine (ZYRTEC) 10 MG tablet, Take 10 mg by mouth daily., Disp: , Rfl:  .  Dulaglutide (TRULICITY) 1.5 MG/BO/1.7PZPN, Inject 1.5 mg into the skin once a week. Inject 1.5mg48mto the skin once weekly., Disp: , Rfl:  .  hydrochlorothiazide (HYDRODIURIL) 25 MG tablet, Take 1 tablet (25 mg total) by mouth daily., Disp: 90 tablet, Rfl: 1 .  insulin lispro (HUMALOG KWIKPEN)  100 UNIT/ML KwikPen, Inject into the skin. 4-5 Units at breakfast; 12-14 at supper, Disp: , Rfl:  .  Insulin Pen Needle (EASY TOUCH PEN NEEDLES) 31G X 5 MM MISC, USE FOUR DAILY TO INJECT INSULIN, Disp: 400 each, Rfl: 3 .  latanoprost (XALATAN) 0.005 % ophthalmic solution, Place 1 drop into the left eye at bedtime. , Disp: , Rfl:  .  lisinopril (PRINIVIL,ZESTRIL) 40 MG tablet, Take 1 tablet (40 mg total) by mouth daily., Disp: 90 tablet, Rfl: 3 .  meclizine (ANTIVERT) 25 MG tablet, Take 0.5-1 tablets (12.5-25 mg total) by mouth 3 (three) times daily as needed for dizziness., Disp: 30 tablet, Rfl: 0 .  metoprolol  succinate (TOPROL-XL) 100 MG 24 hr tablet, TAKE 1 TABLET BY MOUTH  DAILY WITH OR IMMEDIATELY  FOLLOWING A MEAL., Disp: 90 tablet, Rfl: 3 .  ONE TOUCH ULTRA TEST test strip, Use to check BG twice daily, Disp: 200 each, Rfl: 3 .  ONETOUCH DELICA LANCETS 53I MISC, Use to check BG twice daily, Disp: 100 each, Rfl: 5 .  TRESIBA FLEXTOUCH 200 UNIT/ML SOPN, INJECT 80 UNITS  SUBCUTANEOUSLY DAILY, Disp: 36 mL, Rfl: 3 .  vitamin C (ASCORBIC ACID) 500 MG tablet, Take 500 mg by mouth daily., Disp: , Rfl:  .  vitamin E (VITAMIN E) 1000 UNIT capsule, Take 1,000 Units by mouth daily., Disp: , Rfl:  Social History   Socioeconomic History  . Marital status: Married    Spouse name: Not on file  . Number of children: 2  . Years of education: 55  . Highest education level: 11th grade  Occupational History  . Occupation: maintenance    Employer: [employer]    Comment: Retired  Scientific laboratory technician  . Financial resource strain: Not hard at all  . Food insecurity    Worry: Never true    Inability: Never true  . Transportation needs    Medical: No    Non-medical: No  Tobacco Use  . Smoking status: Former Smoker    Packs/day: 2.00    Years: 20.00    Pack years: 40.00    Types: Cigarettes    Quit date: 07/20/1986    Years since quitting: 32.7  . Smokeless tobacco: Never Used  Substance and Sexual Activity  . Alcohol use: No    Alcohol/week: 0.0 standard drinks  . Drug use: No  . Sexual activity: Yes  Lifestyle  . Physical activity    Days per week: 3 days    Minutes per session: 30 min  . Stress: Only a little  Relationships  . Social connections    Talks on phone: More than three times a week    Gets together: More than three times a week    Attends religious service: Never    Active member of club or organization: Yes    Attends meetings of clubs or organizations: More than 4 times per year    Relationship status: Married  . Intimate partner violence    Fear of current or ex partner: No     Emotionally abused: No    Physically abused: No    Forced sexual activity: No  Other Topics Concern  . Not on file  Social History Narrative  . Not on file   Family History  Problem Relation Age of Onset  . Heart disease Mother   . Cancer Mother        breast  . Stroke Father 97  . Hypertension Father   . Early death  Sister        infant death  . Heart disease Brother   . Hydrocephalus Brother   . Heart disease Brother   . Diabetes Brother   . Cancer Brother        prostat  . Healthy Son   . Healthy Son     Objective: Office vital signs reviewed. BP (!) 97/48 (BP Location: Left Arm, Cuff Size: Normal)   Pulse 73   Temp 99 F (37.2 C)   Resp 18   Ht _0  (1.6 m)   Wt 181 lb (82.1 kg)   SpO2 99%   BMI 32.06 kg/m   Physical Examination:  General: Awake, alert, well nourished, No acute distress HEENT: Normal, sclera white, MMM Cardio: regular rate and rhythm, S1S2 heard, 1/6 SEM at RSB noted Pulm: clear to auscultation bilaterally, no wheezes, rhonchi or rales; normal work of breathing on room air Extremities: warm, well perfused, No edema, cyanosis or clubbing; +2 pulses bilaterally Neuro: see dm foot  Diabetic Foot Exam - Simple   Simple Foot Form Diabetic Foot exam was performed with the following findings: Yes 04/03/2019 10:21 AM  Visual Inspection No deformities, no ulcerations, no other skin breakdown bilaterally: Yes See comments: Yes Sensation Testing Intact to touch and monofilament testing bilaterally: Yes Pulse Check Posterior Tibialis and Dorsalis pulse intact bilaterally: Yes Comments Vibratory sensation intact bilaterally.  He has onychomycotic changes to bilateral feet and nails.  He is followed by Dr. Irving Shows with regular nail cutting.     Assessment/ Plan: 78 y.o. male   1. Type 2 diabetes mellitus with stage 3 chronic kidney disease, without long-term current use of insulin (HCC) Under excellent control with A1c of 6.2 today.  I do worry  that his fasting blood sugars are getting a little bit on the low side.  He has follow-up with Dr. Dwyane Dee later this month and I have asked that he bring this to his attention, particularly if he starts becoming symptomatic.  He declined influenza vaccine today and wants to wait till October. - Dexter [place] Hb A1c Waived  2. Hyperlipidemia associated w/ type 2 DM goal <70 Continue statin for secondary prevention - CMP14+EGFR  3. Hypertension associated with diabetes ([place]) He is borderline hypotensive today.  His recheck was slightly better.  I advised him to keep a close eye on blood pressures and if he remains below 090 systolics, low threshold to discontinue the diuretic.  He will monitor his blood pressures regularly for the next 2 weeks and contact me with this record, sooner if he starts becoming symptomatic. - CMP14+EGFR   Orders Placed This Encounter  Procedures  . CMP14+EGFR  . Bayer DCA Hb A1c Waived   No orders of the defined types were placed in this encounter.    [name], DO [place] [phone]

## 2019-04-03 NOTE — Patient Instructions (Addendum)
Sugar is looking good!  A1c was 6.2 today.  I will send this result to your diabetes doctor.  Make sure to tell him about the sugars you get in the 70s.  Keep an eye on your blood pressure.  If it stays low, we might need to back down on your medication.  Flu shots will be available in October.  Please schedule your flu shot.

## 2019-04-04 LAB — CMP14+EGFR
ALT: 29 IU/L (ref 0–44)
AST: 21 IU/L (ref 0–40)
Albumin/Globulin Ratio: 1.9 (ref 1.2–2.2)
Albumin: 4 g/dL (ref 3.7–4.7)
Alkaline Phosphatase: 95 IU/L (ref 39–117)
BUN/Creatinine Ratio: 15 (ref 10–24)
BUN: 29 mg/dL — ABNORMAL HIGH (ref 8–27)
Bilirubin Total: 0.3 mg/dL (ref 0.0–1.2)
CO2: 24 mmol/L (ref 20–29)
Calcium: 9.1 mg/dL (ref 8.6–10.2)
Chloride: 104 mmol/L (ref 96–106)
Creatinine, Ser: 1.88 mg/dL — ABNORMAL HIGH (ref 0.76–1.27)
GFR calc Af Amer: 39 mL/min/{1.73_m2} — ABNORMAL LOW (ref >59)
GFR calc non Af Amer: 34 mL/min/{1.73_m2} — ABNORMAL LOW (ref >59)
Globulin, Total: 2.1 g/dL (ref 1.5–4.5)
Glucose: 113 mg/dL — ABNORMAL HIGH (ref 65–99)
Potassium: 4.2 mmol/L (ref 3.5–5.2)
Sodium: 141 mmol/L (ref 134–144)
Total Protein: 6.1 g/dL (ref 6.0–8.5)

## 2019-04-05 ENCOUNTER — Ambulatory Visit: Payer: Medicare Other | Admitting: Endocrinology

## 2019-04-11 ENCOUNTER — Encounter: Payer: Self-pay | Admitting: *Deleted

## 2019-04-12 ENCOUNTER — Ambulatory Visit: Payer: Medicare Other | Admitting: Endocrinology

## 2019-04-12 ENCOUNTER — Ambulatory Visit (INDEPENDENT_AMBULATORY_CARE_PROVIDER_SITE_OTHER): Payer: Medicare Other | Admitting: Endocrinology

## 2019-04-12 ENCOUNTER — Other Ambulatory Visit: Payer: Self-pay

## 2019-04-12 ENCOUNTER — Telehealth: Payer: Self-pay | Admitting: Family Medicine

## 2019-04-12 DIAGNOSIS — E1122 Type 2 diabetes mellitus with diabetic chronic kidney disease: Secondary | ICD-10-CM

## 2019-04-12 DIAGNOSIS — N183 Chronic kidney disease, stage 3 unspecified: Secondary | ICD-10-CM

## 2019-04-12 DIAGNOSIS — I10 Essential (primary) hypertension: Secondary | ICD-10-CM | POA: Diagnosis not present

## 2019-04-12 DIAGNOSIS — Z794 Long term (current) use of insulin: Secondary | ICD-10-CM | POA: Diagnosis not present

## 2019-04-12 NOTE — Telephone Encounter (Signed)
Aware of results. 

## 2019-04-12 NOTE — Progress Notes (Signed)
Patient ID: Evan Stanley, male   DOB: Sep 17, 1940, 78 y.o.   MRN: [id number]          Reason for Appointment: Follow-up for Type 2 Diabetes  Referring physician: Kenn File  Today's office visit was provided via telemedicine using a telephone call to the patient Patient has been explained the limitations of evaluation and management by telemedicine and the availability of in person appointments.  The patient understood the limitations and agreed to proceed. Patient also understood that the telehealth visit is billable. . Location of the patient: Home . Location of the provider: Office Only the patient and myself were participating in the encounter  History of Present Illness:          Date of diagnosis of type 2 diabetes mellitus: 2010       Background history:   He thinks he was diagnosed to have diabetes after his coronary bypass surgery and blood sugars are not very high He had been on metformin until about 05/2017 Also at some point had taken glimepiride Usually appears to have had A1c just above 7%  Recent history:   INSULIN regimen is: [place] 80  units daily, Humalog 4-5 with breakfast and usually 14 units at dinner     Non-insulin hypoglycemic drugs the patient is taking are: Trulicity 1.5 mg weekly  His A1c has been progressively improving and now 6.2 done by PCP  Current management, blood sugar patterns and problems identified:  He has been checking her blood sugars fasting and 2 hours after dinner  Overall blood sugars appear to be somewhat lower  Also has less variability compared to his last visit  Was advised to take Humalog consistently in the morning but he is afraid to take it when blood sugars are below 100  Late morning blood sugar in the lab was 113  However periodically does eat cereal in the morning  Has had a couple of readings in the 60s after supper and generally blood sugars are lower than before at night  He is somewhat active with  playing golf and walking at times  His weight has come down        Side effects from medications have been: None  Glucose monitoring:  done about 2 times a day         Glucometer: One Touch.       Blood Glucose readings by review of home monitor download show   PRE-MEAL Fasting Lunch Dinner Bedtime Overall  Glucose range:  85-121      Mean/median:  98     100   POST-MEAL PC Breakfast PC Lunch PC Dinner  Glucose range: ?    66-154  Mean/median:    100   Previous readings:  PRE-MEAL Fasting Lunch Dinner Bedtime Overall  Glucose range:  85-206      Mean/median:  120     127+/-38   POST-MEAL PC Breakfast PC Lunch PC Dinner  Glucose range:    77-223  Mean/median:    150       Self-care: The diet that the patient has been following is: tries to limit sweets.     Meal times are:  Breakfast is at after 9 AM  Typical meal intake: Breakfast is cheerios at times, otherwise eggs            Dietician visit, most recent: 2019               Exercise:  Periodically when playing golf  Weight  history:  Wt Readings from Last 3 Encounters:  04/03/19 181 lb (82.1 kg)  01/03/19 186 lb 9.6 oz (84.6 kg)  11/29/18 185 lb 12.8 oz (84.3 kg)    Glycemic control:   Lab Results  Component Value Date   HGBA1C 6.2 04/03/2019   HGBA1C 7.1 (A) 01/03/2019   HGBA1C 8.0 (A) 10/03/2018   Lab Results  Component Value Date   MICROALBUR neg 11/28/2014   LDLCALC 36 10/03/2018   CREATININE 1.88 (H) 04/03/2019   No results found for: [facility]  Lab Results  Component Value Date   FRUCTOSAMINE 323 (H) 04/22/2018      Allergies as of 04/12/2019   No Known Allergies     Medication List       Accurate as of April 12, 2019 10:09 AM. If you have any questions, ask your nurse or doctor.        amLODipine 10 MG tablet Commonly known as: NORVASC Take 1 tablet (10 mg total) by mouth daily.   aspirin EC 81 MG tablet Take 1 tablet (81 mg total) by mouth daily.    atorvastatin 40 MG tablet Commonly known as: LIPITOR Take 1 tablet (40 mg total) by mouth daily.   cetirizine 10 MG tablet Commonly known as: ZYRTEC Take 10 mg by mouth daily.   HumaLOG KwikPen 100 UNIT/ML KwikPen Generic drug: insulin lispro Inject into the skin. 4-5 Units at breakfast if over 100, hold if under 100.14 at supper   hydrochlorothiazide 25 MG tablet Commonly known as: HYDRODIURIL Take 1 tablet (25 mg total) by mouth daily.   Insulin Pen Needle 31G X 5 MM Misc Commonly known as: Easy Touch Pen Needles USE FOUR DAILY TO INJECT INSULIN   latanoprost 0.005 % ophthalmic solution Commonly known as: XALATAN Place 1 drop into the left eye at bedtime.   lisinopril 40 MG tablet Commonly known as: ZESTRIL Take 1 tablet (40 mg total) by mouth daily.   metoprolol succinate 100 MG 24 hr tablet Commonly known as: TOPROL-XL TAKE 1 TABLET BY MOUTH  DAILY WITH OR IMMEDIATELY  FOLLOWING A MEAL.   ONE TOUCH ULTRA TEST test strip Generic drug: glucose blood Use to check BG twice daily   OneTouch Delica Lancets 48N Misc Use to check BG twice daily   Tresiba FlexTouch 200 UNIT/ML Sopn Generic drug: Insulin Degludec INJECT 80 UNITS  SUBCUTANEOUSLY DAILY   Trulicity 1.5 IO/2.7OJ Sopn Generic drug: Dulaglutide Inject 1.5 mg into the skin once a week. Inject 1.63m into the skin once weekly.   vitamin C 500 MG tablet Commonly known as: ASCORBIC ACID Take 500 mg by mouth daily.   vitamin E 1000 UNIT capsule Generic drug: vitamin E Take 1,000 Units by mouth daily.       Allergies: No Known Allergies  Past Medical History:  Diagnosis Date  . AKI (acute kidney injury) ([place]   . Allergy   . Anxiety   . Arthritis    Right shoulder  . Atrial fibrillation ([place]   . Benign essential HTN   . Carotid artery stenosis   . Cataract   . CHF (congestive heart failure) (HPine[place]   . CVA (cerebral vascular accident) (HKing 04/28/2016  . Decreased vision    left eye  . Diabetes  mellitus without complication (HTable [place]    Takes Metformin  . Hyperlipidemia   . Hypertension   . Salmonella bacteremia 04/29/2016  . Sepsis (H[place] 04/29/2016  . Stroke ([place] 11/18/2015   no deficits  . TIA (transient  ischemic attack)    affected left eye  . Urgency of urination     Past Surgical History:  Procedure Laterality Date  . AORTIC VALVE REPLACEMENT    . BACK SURGERY  80s  . CARDIAC VALVE REPLACEMENT  2010  . ENDARTERECTOMY Right 01/13/2016   Procedure: ENDARTERECTOMY CAROTID - RIGHT;  Surgeon: Conrad Westside, MD;  Location: [facility];  Service: Vascular;  Laterality: Right;  . EYE SURGERY    . laser surgery, left eye  Left 10-29-2015    retinal surgery/ Deloria Lair MD   . PERIPHERAL VASCULAR CATHETERIZATION Right 11/18/2015   Procedure: Carotid Angiography;  Surgeon: Conrad Stanislaus, MD;  Location: [facility] CV LAB;  Service: Cardiovascular;  Laterality: Right;  . PERIPHERAL VASCULAR CATHETERIZATION N/A 11/18/2015   Procedure: Aortic Arch Angiography;  Surgeon: Conrad Sehili, MD;  Location: [facility] CV LAB;  Service: Cardiovascular;  Laterality: N/A;  . TEE WITHOUT CARDIOVERSION N/A 05/05/2016   Procedure: TRANSESOPHAGEAL ECHOCARDIOGRAM (TEE);  Surgeon: Lelon Perla, MD;  [facility];  Service: Cardiovascular;  Laterality: N/A;    Family History  Problem Relation Age of Onset  . Heart disease Mother   . Cancer Mother        breast  . Stroke Father 32  . Hypertension Father   . Early death Sister        infant death  . Heart disease Brother   . Hydrocephalus Brother   . Heart disease Brother   . Diabetes Brother   . Cancer Brother        prostat  . Healthy Son   . Healthy Son     Social History:  reports that he quit smoking about 32 years ago. His smoking use included cigarettes. He has a 40.00 pack-year smoking history. He has never used smokeless tobacco. He reports that he does not drink alcohol or use drugs.   Review of Systems   Lipid history:  Currently on atorvastatin 40 mg daily prescribed by his PCP Has not had any recent follow-up Has history of CAD and CVD    Lab Results  Component Value Date   CHOL 86 10/03/2018   HDL 29.90 (L) 10/03/2018   LDLCALC 36 10/03/2018   LDLDIRECT 62 02/16/2017   TRIG 97.0 10/03/2018   CHOLHDL 3 10/03/2018           Hypertension:  On multiple drugs including hydrochlorothiazide 25 mg and lisinopril 40 mg daily, Norvasc 10 mg daily prescribed by PCP Blood pressure at home has been mostly 789-381 systolic and was relatively low in the physician office also recently No medication changes made  BP Readings from Last 3 Encounters:  04/03/19 (!) 97/48  01/03/19 120/62  11/29/18 132/76    He has had nephrology consultation and no recommendations made  Lab Results  Component Value Date   CREATININE 1.88 (H) 04/03/2019   CREATININE 1.96 (H) 10/03/2018   CREATININE 1.93 (H) 04/22/2018     Most recent foot exam: 7/19    LABS:  No visits with results within 1 Week(s) from this visit.  Latest known visit with results is:  Office Visit on 04/03/2019  Component Date Value Ref Range Status  . Glucose 04/03/2019 113* 65 - 99 mg/dL Final  . BUN 04/03/2019 29* 8 - 27 mg/dL Final  . Creatinine, Ser 04/03/2019 1.88* 0.76 - 1.27 mg/dL Final  . GFR calc non Af Amer 04/03/2019 34* >59 mL/min/1.73 Final  . GFR calc Af Wyvonnia Lora 04/03/2019  39* >59 mL/min/1.73 Final  . BUN/Creatinine Ratio 04/03/2019 15  10 - 24 Final  . Sodium 04/03/2019 141  134 - 144 mmol/L Final  . Potassium 04/03/2019 4.2  3.5 - 5.2 mmol/L Final  . Chloride 04/03/2019 104  96 - 106 mmol/L Final  . CO2 04/03/2019 24  20 - 29 mmol/L Final  . Calcium 04/03/2019 9.1  8.6 - 10.2 mg/dL Final  . Total Protein 04/03/2019 6.1  6.0 - 8.5 g/dL Final  . Albumin 04/03/2019 4.0  3.7 - 4.7 g/dL Final  . Globulin, Total 04/03/2019 2.1  1.5 - 4.5 g/dL Final  . Albumin/Globulin Ratio 04/03/2019 1.9  1.2 - 2.2 Final  . Bilirubin Total  04/03/2019 0.3  0.0 - 1.2 mg/dL Final  . Alkaline Phosphatase 04/03/2019 95  39 - 117 IU/L Final  . AST 04/03/2019 21  0 - 40 IU/L Final  . ALT 04/03/2019 29  0 - 44 IU/L Final  . HB A1C (BAYER DCA - WAIVED) 04/03/2019 6.2  <7.0 % Final   Comment:                                       Diabetic Adult            <7.0                                       Healthy Adult        4.3 - 5.7                                                           (DCCT/NGSP) American Diabetes Association's Summary of Glycemic Recommendations for Adults with Diabetes: Hemoglobin A1c <7.0%. More stringent glycemic goals (A1c <6.0%) may further reduce complications at the cost of increased risk of hypoglycemia.     Physical Examination:  There were no vitals taken for this visit.      ASSESSMENT:  Diabetes type 2, on insulin  See history of present illness for detailed discussion of current diabetes management, blood sugar patterns and problems identified  His A1c has improved to 6.2  His blood sugars are improved and he is not having as much fluctuations However fasting and after supper readings appear to be low normal at times without significant hypoglycemia He has lost weight Still on relatively large doses of basal insulin of 80 units   HYPERTENSION: Blood pressure is relatively low and considering his renal insufficiency and age does not need to be tightly controlled On multiple medications now   PLAN:    Reduce Tresiba to 76 and if blood sugars are still mostly below 100 in the morning reduce the dose to  He can take 10 units of Humalog to cover evening meal, if eating larger amounts of carbohydrate or dessert will take 12-14  Continue 45 units at breakfast  No change in Trulicity weekly  Call if blood sugars are significantly higher or lower  Encouraged him to continue walking or other exercise  Reduce AMLODIPINE to half tablet  There are no Patient Instructions on file for this  visit.   Duration of telephone  encounter =11 minutes  [name] 04/12/2019, 10:09 AM   Note: This office note was prepared with Dragon voice recognition system technology. Any transcriptional errors that result from this process are unintentional.

## 2019-04-20 ENCOUNTER — Telehealth: Payer: Self-pay | Admitting: Family Medicine

## 2019-04-25 ENCOUNTER — Other Ambulatory Visit: Payer: Self-pay | Admitting: Family Medicine

## 2019-04-25 DIAGNOSIS — I152 Hypertension secondary to endocrine disorders: Secondary | ICD-10-CM

## 2019-04-25 DIAGNOSIS — E1159 Type 2 diabetes mellitus with other circulatory complications: Secondary | ICD-10-CM

## 2019-04-25 MED ORDER — AMLODIPINE BESYLATE 10 MG PO TABS: 5.0000 mg | ORAL_TABLET | Freq: Every day | ORAL | 3 refills | Status: DC

## 2019-04-25 NOTE — Progress Notes (Signed)
Please inform patient that I reviewed his blood pressure log and this is appropriate.  Continue the amlodipine at half dose as recommended by Dr. Dwyane Dee.  I have updated this to reflect his current medication usage

## 2019-04-25 NOTE — Progress Notes (Signed)
Wife aware

## 2019-04-25 NOTE — Progress Notes (Signed)
Patient aware and verbalizes understanding - states he has been taking the whole pill of Amlodipine 47m.  States that Dr. KDwyane Deesuggested to go to 0.563mbut patient was waiting to here back from Dr. G Darnell Levels to what she recommenced.  Please clarify what does you want patient to take.

## 2019-04-25 NOTE — Progress Notes (Signed)
Ok to continue 35m if BPs have been controlled.

## 2019-05-09 ENCOUNTER — Telehealth (HOSPITAL_COMMUNITY): Payer: Self-pay

## 2019-05-09 NOTE — Telephone Encounter (Signed)
The above patient or their representative was contacted and gave the following answers to these questions:         Do you have any of the following symptoms?    NO  Fever                    Cough                   Shortness of breath  Do  you have any of the following other symptoms? NO   muscle pain         vomiting,        diarrhea        rash         weakness        red eye        abdominal pain         bruising          bruising or bleeding              joint pain           severe headache    Have you been in contact with someone who was or has been sick in the past 2 weeks?  NO  Yes                 Unsure                         Unable to assess   Does the person that you were in contact with have any of the following symptoms?   Cough         shortness of breath           muscle pain         vomiting,            diarrhea            rash            weakness           fever            red eye           abdominal pain           bruising  or  bleeding                joint pain                severe headache                 COMMENTS OR ACTION PLAN FOR THIS PATIENT:

## 2019-05-10 ENCOUNTER — Ambulatory Visit: Payer: Medicare Other | Admitting: Family

## 2019-05-10 ENCOUNTER — Encounter (HOSPITAL_COMMUNITY): Payer: Medicare Other

## 2019-05-13 ENCOUNTER — Other Ambulatory Visit: Payer: Self-pay

## 2019-05-13 ENCOUNTER — Observation Stay (HOSPITAL_COMMUNITY)
Admission: EM | Admit: 2019-05-13 | Discharge: 2019-05-15 | Disposition: A | Discharge status: Home / Self Care | Payer: Medicare Other | Attending: Internal Medicine | Admitting: Internal Medicine

## 2019-05-13 ENCOUNTER — Encounter (HOSPITAL_COMMUNITY): Payer: Self-pay | Admitting: Emergency Medicine

## 2019-05-13 ENCOUNTER — Emergency Department (HOSPITAL_COMMUNITY): Payer: Medicare Other

## 2019-05-13 DIAGNOSIS — E1159 Type 2 diabetes mellitus with other circulatory complications: Secondary | ICD-10-CM | POA: Diagnosis not present

## 2019-05-13 DIAGNOSIS — E1122 Type 2 diabetes mellitus with diabetic chronic kidney disease: Secondary | ICD-10-CM

## 2019-05-13 DIAGNOSIS — I2510 Atherosclerotic heart disease of native coronary artery without angina pectoris: Secondary | ICD-10-CM | POA: Insufficient documentation

## 2019-05-13 DIAGNOSIS — Z952 Presence of prosthetic heart valve: Secondary | ICD-10-CM | POA: Insufficient documentation

## 2019-05-13 DIAGNOSIS — Z79899 Other long term (current) drug therapy: Secondary | ICD-10-CM | POA: Diagnosis not present

## 2019-05-13 DIAGNOSIS — Z820 Family history of epilepsy and other diseases of the nervous system: Secondary | ICD-10-CM

## 2019-05-13 DIAGNOSIS — Z7982 Long term (current) use of aspirin: Secondary | ICD-10-CM | POA: Insufficient documentation

## 2019-05-13 DIAGNOSIS — R531 Weakness: Principal | ICD-10-CM | POA: Insufficient documentation

## 2019-05-13 DIAGNOSIS — E119 Type 2 diabetes mellitus without complications: Secondary | ICD-10-CM

## 2019-05-13 DIAGNOSIS — R29810 Facial weakness: Secondary | ICD-10-CM | POA: Insufficient documentation

## 2019-05-13 DIAGNOSIS — Z20828 Contact with and (suspected) exposure to other viral communicable diseases: Secondary | ICD-10-CM | POA: Diagnosis not present

## 2019-05-13 DIAGNOSIS — I509 Heart failure, unspecified: Secondary | ICD-10-CM | POA: Diagnosis not present

## 2019-05-13 DIAGNOSIS — I6521 Occlusion and stenosis of right carotid artery: Secondary | ICD-10-CM | POA: Diagnosis not present

## 2019-05-13 DIAGNOSIS — Z8673 Personal history of transient ischemic attack (TIA), and cerebral infarction without residual deficits: Secondary | ICD-10-CM

## 2019-05-13 DIAGNOSIS — G459 Transient cerebral ischemic attack, unspecified: Secondary | ICD-10-CM | POA: Diagnosis not present

## 2019-05-13 DIAGNOSIS — E1169 Type 2 diabetes mellitus with other specified complication: Secondary | ICD-10-CM | POA: Diagnosis not present

## 2019-05-13 DIAGNOSIS — I130 Hypertensive heart and chronic kidney disease with heart failure and stage 1 through stage 4 chronic kidney disease, or unspecified chronic kidney disease: Secondary | ICD-10-CM | POA: Insufficient documentation

## 2019-05-13 DIAGNOSIS — Z87891 Personal history of nicotine dependence: Secondary | ICD-10-CM | POA: Diagnosis not present

## 2019-05-13 DIAGNOSIS — I779 Disorder of arteries and arterioles, unspecified: Secondary | ICD-10-CM | POA: Diagnosis present

## 2019-05-13 DIAGNOSIS — I6522 Occlusion and stenosis of left carotid artery: Secondary | ICD-10-CM | POA: Diagnosis not present

## 2019-05-13 DIAGNOSIS — Z794 Long term (current) use of insulin: Secondary | ICD-10-CM | POA: Insufficient documentation

## 2019-05-13 DIAGNOSIS — N183 Chronic kidney disease, stage 3 unspecified: Secondary | ICD-10-CM | POA: Diagnosis not present

## 2019-05-13 DIAGNOSIS — I152 Hypertension secondary to endocrine disorders: Secondary | ICD-10-CM | POA: Diagnosis present

## 2019-05-13 DIAGNOSIS — I6529 Occlusion and stenosis of unspecified carotid artery: Secondary | ICD-10-CM | POA: Diagnosis present

## 2019-05-13 DIAGNOSIS — Z03818 Encounter for observation for suspected exposure to other biological agents ruled out: Secondary | ICD-10-CM | POA: Diagnosis not present

## 2019-05-13 DIAGNOSIS — E785 Hyperlipidemia, unspecified: Secondary | ICD-10-CM | POA: Diagnosis not present

## 2019-05-13 DIAGNOSIS — I10 Essential (primary) hypertension: Secondary | ICD-10-CM

## 2019-05-13 HISTORY — DX: Personal history of transient ischemic attack (TIA), and cerebral infarction without residual deficits: Z86.73

## 2019-05-13 HISTORY — DX: Family history of epilepsy and other diseases of the nervous system: Z82.0

## 2019-05-13 LAB — I-STAT CHEM 8, ED
BUN: 31 mg/dL — ABNORMAL HIGH (ref 8–23)
Calcium, Ion: 1.22 mmol/L (ref 1.15–1.40)
Chloride: 105 mmol/L (ref 98–111)
Creatinine, Ser: 2 mg/dL — ABNORMAL HIGH (ref 0.61–1.24)
Glucose, Bld: 148 mg/dL — ABNORMAL HIGH (ref 70–99)
HCT: 45 % (ref 39.0–52.0)
Hemoglobin: 15.3 g/dL (ref 13.0–17.0)
Potassium: 4.2 mmol/L (ref 3.5–5.1)
Sodium: 142 mmol/L (ref 135–145)
TCO2: 23 mmol/L (ref 22–32)

## 2019-05-13 LAB — ETHANOL: Alcohol, Ethyl (B): <10 mg/dL (ref <10)

## 2019-05-13 LAB — URINALYSIS, ROUTINE W REFLEX MICROSCOPIC
Bilirubin Urine: NEGATIVE
Glucose, UA: NEGATIVE mg/dL
Hgb urine dipstick: NEGATIVE
Ketones, ur: NEGATIVE mg/dL
Leukocytes,Ua: NEGATIVE
Nitrite: NEGATIVE
Protein, ur: NEGATIVE mg/dL
Specific Gravity, Urine: 1.018 (ref 1.005–1.030)
pH: 5 (ref 5.0–8.0)

## 2019-05-13 LAB — COMPREHENSIVE METABOLIC PANEL
ALT: 44 U/L (ref 0–44)
AST: 26 U/L (ref 15–41)
Albumin: 4.3 g/dL (ref 3.5–5.0)
Alkaline Phosphatase: 92 U/L (ref 38–126)
Anion gap: 8 (ref 5–15)
BUN: 35 mg/dL — ABNORMAL HIGH (ref 8–23)
CO2: 25 mmol/L (ref 22–32)
Calcium: 9.3 mg/dL (ref 8.9–10.3)
Chloride: 106 mmol/L (ref 98–111)
Creatinine, Ser: 1.98 mg/dL — ABNORMAL HIGH (ref 0.61–1.24)
GFR calc Af Amer: 37 mL/min — ABNORMAL LOW (ref >60)
GFR calc non Af Amer: 32 mL/min — ABNORMAL LOW (ref >60)
Glucose, Bld: 158 mg/dL — ABNORMAL HIGH (ref 70–99)
Potassium: 4.3 mmol/L (ref 3.5–5.1)
Sodium: 139 mmol/L (ref 135–145)
Total Bilirubin: 0.7 mg/dL (ref 0.3–1.2)
Total Protein: 7.4 g/dL (ref 6.5–8.1)

## 2019-05-13 LAB — APTT: aPTT: 26 seconds (ref 24–36)

## 2019-05-13 LAB — CBC
HCT: 46.4 % (ref 39.0–52.0)
Hemoglobin: 14.7 g/dL (ref 13.0–17.0)
MCH: 26.6 pg (ref 26.0–34.0)
MCHC: 31.7 g/dL (ref 30.0–36.0)
MCV: 83.9 fL (ref 80.0–100.0)
Platelets: 184 10*3/uL (ref 150–400)
RBC: 5.53 MIL/uL (ref 4.22–5.81)
RDW: 13.9 % (ref 11.5–15.5)
WBC: 9.7 10*3/uL (ref 4.0–10.5)
nRBC: 0 % (ref 0.0–0.2)

## 2019-05-13 LAB — RAPID URINE DRUG SCREEN, HOSP PERFORMED
Amphetamines: NOT DETECTED
Barbiturates: NOT DETECTED
Benzodiazepines: NOT DETECTED
Cocaine: NOT DETECTED
Opiates: NOT DETECTED
Tetrahydrocannabinol: NOT DETECTED

## 2019-05-13 LAB — DIFFERENTIAL
Abs Immature Granulocytes: 0.02 10*3/uL (ref 0.00–0.07)
Basophils Absolute: 0.1 10*3/uL (ref 0.0–0.1)
Basophils Relative: 1 %
Eosinophils Absolute: 0.4 10*3/uL (ref 0.0–0.5)
Eosinophils Relative: 4 %
Immature Granulocytes: 0 %
Lymphocytes Relative: 20 %
Lymphs Abs: 1.9 10*3/uL (ref 0.7–4.0)
Monocytes Absolute: 1 10*3/uL (ref 0.1–1.0)
Monocytes Relative: 11 %
Neutro Abs: 6.4 10*3/uL (ref 1.7–7.7)
Neutrophils Relative %: 64 %

## 2019-05-13 LAB — PROTIME-INR
INR: 1 (ref 0.8–1.2)
Prothrombin Time: 13.2 seconds (ref 11.4–15.2)

## 2019-05-13 LAB — CBG MONITORING, ED: Glucose-Capillary: 61 mg/dL — ABNORMAL LOW (ref 70–99)

## 2019-05-13 MED ORDER — ACETAMINOPHEN 160 MG/5ML PO SOLN: 650.0000 mg | ORAL | Status: DC | PRN

## 2019-05-13 MED ORDER — AMLODIPINE BESYLATE 5 MG PO TABS
10.0000 mg | ORAL_TABLET | Freq: Every day | ORAL | Status: DC
  Administered 2019-05-14: 11:00:00 10 mg via ORAL
  Filled 2019-05-13: qty 2

## 2019-05-13 MED ORDER — INSULIN ASPART 100 UNIT/ML ~~LOC~~ SOLN
0.0000 [IU] | Freq: Three times a day (TID) | SUBCUTANEOUS | Status: DC
  Filled 2019-05-13: qty 1

## 2019-05-13 MED ORDER — LATANOPROST 0.005 % OP SOLN
1.0000 [drp] | Freq: Every day | OPHTHALMIC | Status: DC
  Filled 2019-05-13: qty 2.5

## 2019-05-13 MED ORDER — SENNOSIDES-DOCUSATE SODIUM 8.6-50 MG PO TABS
1.0000 | ORAL_TABLET | Freq: Every evening | ORAL | Status: DC | PRN
  Filled 2019-05-13: qty 1

## 2019-05-13 MED ORDER — LISINOPRIL 10 MG PO TABS
40.0000 mg | ORAL_TABLET | Freq: Every day | ORAL | Status: DC
  Administered 2019-05-14: 11:00:00 40 mg via ORAL
  Filled 2019-05-13: qty 4

## 2019-05-13 MED ORDER — INSULIN ASPART 100 UNIT/ML ~~LOC~~ SOLN: 0.0000 [IU] | Freq: Every day | SUBCUTANEOUS | Status: DC

## 2019-05-13 MED ORDER — ACETAMINOPHEN 325 MG PO TABS: 650.0000 mg | ORAL_TABLET | ORAL | Status: DC | PRN

## 2019-05-13 MED ORDER — INSULIN DEGLUDEC 100 UNIT/ML ~~LOC~~ SOPN
76.0000 [IU] | PEN_INJECTOR | Freq: Every morning | SUBCUTANEOUS | Status: DC
  Filled 2019-05-13: qty 3

## 2019-05-13 MED ORDER — METOPROLOL SUCCINATE ER 25 MG PO TB24
100.0000 mg | ORAL_TABLET | Freq: Every morning | ORAL | Status: DC
  Administered 2019-05-14: 11:00:00 100 mg via ORAL
  Filled 2019-05-13: qty 4

## 2019-05-13 MED ORDER — ACETAMINOPHEN 650 MG RE SUPP: 650.0000 mg | RECTAL | Status: DC | PRN

## 2019-05-13 MED ORDER — ATORVASTATIN CALCIUM 40 MG PO TABS
40.0000 mg | ORAL_TABLET | Freq: Every evening | ORAL | Status: DC
  Administered 2019-05-14: 40 mg via ORAL
  Filled 2019-05-13 (×2): qty 1

## 2019-05-13 MED ORDER — ENOXAPARIN SODIUM 40 MG/0.4ML ~~LOC~~ SOLN
40.0000 mg | SUBCUTANEOUS | Status: DC
  Administered 2019-05-13 – 2019-05-14 (×2): 40 mg via SUBCUTANEOUS
  Filled 2019-05-13 (×2): qty 0.4

## 2019-05-13 MED ORDER — HYDROCHLOROTHIAZIDE 25 MG PO TABS
25.0000 mg | ORAL_TABLET | Freq: Every day | ORAL | Status: DC
  Administered 2019-05-14: 25 mg via ORAL
  Filled 2019-05-13: qty 1

## 2019-05-13 MED ORDER — DULAGLUTIDE 1.5 MG/0.5ML ~~LOC~~ SOAJ: 1.5000 mg | SUBCUTANEOUS | Status: DC

## 2019-05-13 MED ORDER — INSULIN ASPART 100 UNIT/ML ~~LOC~~ SOLN
10.0000 [IU] | Freq: Every day | SUBCUTANEOUS | Status: DC
  Filled 2019-05-13: qty 1

## 2019-05-13 MED ORDER — INSULIN ASPART 100 UNIT/ML ~~LOC~~ SOLN
5.0000 [IU] | Freq: Every day | SUBCUTANEOUS | Status: DC | PRN
  Filled 2019-05-13: qty 0.05

## 2019-05-13 MED ORDER — LORATADINE 10 MG PO TABS
10.0000 mg | ORAL_TABLET | Freq: Every day | ORAL | Status: DC
  Administered 2019-05-14 – 2019-05-15 (×2): 10 mg via ORAL
  Filled 2019-05-13 (×2): qty 1

## 2019-05-13 MED ORDER — STROKE: EARLY STAGES OF RECOVERY BOOK
Freq: Once | Status: AC
  Administered 2019-05-14
  Filled 2019-05-13 (×2): qty 1

## 2019-05-13 NOTE — H&P (Signed)
History and Physical  Evan Stanley EQA:[id number] DOB: 1941-02-07 DOA: 05/13/2019  Referring physician: Dr Dewayne Hatch, ED physician PCP: [name], DO  Outpatient Specialists:   Patient Coming From: home  Chief Complaint: right facial droop, inability to coordinate movements with right arm  HPI: Evan Stanley is a 78 y.o. male with a history of stroke with minimal residual left-sided weakness, hypertension, CHF, diabetes, hyperlipidemia, stage III chronic kidney disease.  Patient seen for right facial droop and difficulty coordinating movements with right arm that started approximately 2:00.  Patient was brought to the emergency department where his symptoms had resolved.  No palliating or provoking factors.  Emergency Department Course: Teleneurology evaluated patient and recommended admission for TIA work-up.  CT of the head showed no new acute process  Review of Systems:   Pt denies any fevers, chills, nausea, vomiting, diarrhea, constipation, abdominal pain, shortness of breath, dyspnea on exertion, orthopnea, cough, wheezing, palpitations, headache, vision changes, lightheadedness, dizziness, melena, rectal bleeding.  Review of systems are otherwise negative  Past Medical History:  Diagnosis Date  . AKI (acute kidney injury) ([place])   . Allergy   . Anxiety   . Arthritis    Right shoulder  . Atrial fibrillation ([place])   . Benign essential HTN   . Carotid artery stenosis   . Cataract   . CHF (congestive heart failure) ([place])   . CVA (cerebral vascular accident) ([place]) 04/28/2016  . Decreased vision    left eye  . Diabetes mellitus without complication ([place])    Takes Metformin  . Hyperlipidemia   . Hypertension   . Salmonella bacteremia 04/29/2016  . Sepsis ([place]) 04/29/2016  . Stroke ([place]) 11/18/2015   no deficits  . TIA (transient ischemic attack)    affected left eye  . Urgency of urination    Past Surgical History:  Procedure Laterality Date  . AORTIC VALVE  REPLACEMENT    . BACK SURGERY  80s  . CARDIAC VALVE REPLACEMENT  2010  . ENDARTERECTOMY Right 01/13/2016   Procedure: ENDARTERECTOMY CAROTID - RIGHT;  Surgeon: Conrad Parker Strip, MD;  Location: [facility];  Service: Vascular;  Laterality: Right;  . EYE SURGERY    . laser surgery, left eye  Left 10-29-2015    retinal surgery/ Deloria Lair MD   . PERIPHERAL VASCULAR CATHETERIZATION Right 11/18/2015   Procedure: Carotid Angiography;  Surgeon: Conrad Tuntutuliak, MD;  Location: [facility];  Service: Cardiovascular;  Laterality: Right;  . PERIPHERAL VASCULAR CATHETERIZATION N/A 11/18/2015   Procedure: Aortic Arch Angiography;  Surgeon: Conrad Fairwater, MD;  Location: [facility];  Service: Cardiovascular;  Laterality: N/A;  . TEE WITHOUT CARDIOVERSION N/A 05/05/2016   Procedure: TRANSESOPHAGEAL ECHOCARDIOGRAM (TEE);  Surgeon: Lelon Perla, MD;  Location: [facility] ENDOSCOPY;  Service: Cardiovascular;  Laterality: N/A;   Social History:  reports that he quit smoking about 32 years ago. His smoking use included cigarettes. He has a 40.00 pack-year smoking history. He has never used smokeless tobacco. He reports that he does not drink alcohol or use drugs. Patient lives at home  No Known Allergies  Family History  Problem Relation Age of Onset  . Heart disease Mother   . Cancer Mother        breast  . Stroke Father 59  . Hypertension Father   . Early death Sister        infant death  . Heart disease Brother   . Hydrocephalus Brother   . Heart disease  Brother   . Diabetes Brother   . Cancer Brother        prostat  . Healthy Son   . Healthy Son       Prior to Admission medications   Medication Sig Start Date End Date Taking? Authorizing Provider  amLODipine (NORVASC) 10 MG tablet Take 0.5 tablets (5 mg total) by mouth daily. Patient taking differently: Take 10 mg by mouth daily.  04/25/19  Yes [name], DO  aspirin EC 81 MG tablet Take 1 tablet (81 mg total) by mouth daily. 08/16/15  Yes  Belva Crome, MD  atorvastatin (LIPITOR) 40 MG tablet Take 1 tablet (40 mg total) by mouth daily. Patient taking differently: Take 40 mg by mouth every evening.  06/30/18  Yes Gottschalk, Leatrice Jewels M, DO  cetirizine (ZYRTEC) 10 MG tablet Take 10 mg by mouth daily.   Yes [provider]  Dulaglutide (TRULICITY) 1.5 MO/2.9UT SOPN Inject 1.5 mg into the skin every Saturday.    Yes [provider]  hydrochlorothiazide (HYDRODIURIL) 25 MG tablet Take 1 tablet (25 mg total) by mouth daily. 11/29/18  Yes Gottschalk, Ashly M, DO  insulin lispro (HUMALOG KWIKPEN) 100 UNIT/ML KwikPen Inject 5-10 Units into the skin See admin instructions. 5 Units at breakfast if over 100, hold if under 100. Give 10 units at daily at supper   Yes [provider]  latanoprost (XALATAN) 0.005 % ophthalmic solution Place 1 drop into the left eye at bedtime.    Yes [provider]  lisinopril (PRINIVIL,ZESTRIL) 40 MG tablet Take 1 tablet (40 mg total) by mouth daily. 06/30/18  Yes Gottschalk, Leatrice Jewels M, DO  metoprolol succinate (TOPROL-XL) 100 MG 24 hr tablet TAKE 1 TABLET BY MOUTH  DAILY WITH OR IMMEDIATELY  FOLLOWING A MEAL. Patient taking differently: Take 100 mg by mouth every morning.  06/30/18  Yes Gottschalk, Ashly M, DO  TRESIBA FLEXTOUCH 200 UNIT/ML SOPN INJECT 80 UNITS  SUBCUTANEOUSLY DAILY Patient taking differently: Inject 76 Units into the skin every morning.  03/04/19  Yes [name], MD  vitamin C (ASCORBIC ACID) 500 MG tablet Take 500 mg by mouth daily.   Yes [provider]  vitamin E (VITAMIN E) 1000 UNIT capsule Take 1,000 Units by mouth daily.   Yes [provider]    Physical Exam: BP (!) 131/112   Pulse 63   Temp 97.6 F (36.4 C) (Oral)   Resp (!) 22   Ht 5' 7"  (1.702 m)   Wt 82.1 kg   SpO2 97%   BMI 28.35 kg/m   . General: Elderly male. Awake and alert and oriented x3. No acute cardiopulmonary distress.  Marland Kitchen HEENT: Normocephalic atraumatic.  Right  and left ears normal in appearance.  Pupils equal, round, reactive to light. Extraocular muscles are intact. Sclerae anicteric and noninjected.  Moist mucosal membranes. No mucosal lesions.  . Neck: Neck supple without lymphadenopathy. No carotid bruits. No masses palpated.  . Cardiovascular: Regular rate with normal S1-S2 sounds. No murmurs, rubs, gallops auscultated. No JVD.  Marland Kitchen Respiratory: Good respiratory effort with no wheezes, rales, rhonchi. Lungs clear to auscultation bilaterally.  No accessory muscle use. . Abdomen: Soft, nontender, nondistended. Active bowel sounds. No masses or hepatosplenomegaly  . Skin: No rashes, lesions, or ulcerations.  Dry, warm to touch. 2+ dorsalis pedis and radial pulses. . Musculoskeletal: No calf or leg pain. All major joints not erythematous nontender.  No upper or lower joint deformation.  Good ROM.  No contractures  .  Psychiatric: Intact judgment and insight. Pleasant and cooperative. . Neurologic: No focal neurological deficits. Strength is 5/5 and symmetric in upper and lower extremities.  Cranial nerves II through XII are grossly intact.  Coordination intact in upper and lower extremities           Labs on Admission: I have personally reviewed following labs and imaging studies  CBC: Recent Labs  Lab 05/13/19 1645 05/13/19 1715  WBC 9.7  --   NEUTROABS 6.4  --   HGB 14.7 15.3  HCT 46.4 45.0  MCV 83.9  --   PLT 184  --    Basic Metabolic Panel: Recent Labs  Lab 05/13/19 1645 05/13/19 1715  NA 139 142  K 4.3 4.2  CL 106 105  CO2 25  --   GLUCOSE 158* 148*  BUN 35* 31*  CREATININE 1.98* 2.00*  CALCIUM 9.3  --    GFR: Estimated Creatinine Clearance: 32.2 mL/min (A) (by C-G formula based on SCr of 2 mg/dL (H)). Liver Function Tests: Recent Labs  Lab 05/13/19 1645  AST 26  ALT 44  ALKPHOS 92  BILITOT 0.7  PROT 7.4  ALBUMIN 4.3   No results for input(s): LIPASE, AMYLASE in the last 168 hours. No results for input(s): AMMONIA  in the last 168 hours. Coagulation Profile: Recent Labs  Lab 05/13/19 1645  INR 1.0   Cardiac Enzymes: No results for input(s): CKTOTAL, CKMB, CKMBINDEX, TROPONINI in the last 168 hours. BNP (last 3 results) No results for input(s): PROBNP in the last 8760 hours. HbA1C: No results for input(s): HGBA1C in the last 72 hours. CBG: No results for input(s): GLUCAP in the last 168 hours. Lipid Profile: No results for input(s): CHOL, HDL, LDLCALC, TRIG, CHOLHDL, LDLDIRECT in the last 72 hours. Thyroid Function Tests: No results for input(s): TSH, T4TOTAL, FREET4, T3FREE, THYROIDAB in the last 72 hours. Anemia Panel: No results for input(s): VITAMINB12, FOLATE, FERRITIN, TIBC, IRON, RETICCTPCT in the last 72 hours. Urine analysis:    Component Value Date/Time   COLORURINE YELLOW 01/10/2016 [place] 01/10/2016 1035   LABSPEC 1.018 01/10/2016 1035   PHURINE 6.5 01/10/2016 1035   GLUCOSEU 100 (A) 01/10/2016 1035   HGBUR NEGATIVE 01/10/2016 1035   BILIRUBINUR NEGATIVE 01/10/2016 1035   KETONESUR NEGATIVE 01/10/2016 1035   PROTEINUR NEGATIVE 01/10/2016 1035   UROBILINOGEN >8.0 (H) 10/30/2008 2328   NITRITE NEGATIVE 01/10/2016 1035   LEUKOCYTESUR NEGATIVE 01/10/2016 1035   Sepsis Labs: @LABRCNTIP (procalcitonin:4,lacticidven:4) )No results found for this or any previous visit (from the past 240 hour(s)).   Radiological Exams on Admission: Ct Head Wo Contrast  Result Date: 05/13/2019 CLINICAL DATA:  Altered mental status, abnormal movements, difficulty walking EXAM: CT HEAD WITHOUT CONTRAST TECHNIQUE: Contiguous axial images were obtained from the base of the skull through the vertex without intravenous contrast. COMPARISON:  04/26/2016 FINDINGS: Brain: No evidence of acute infarction, hemorrhage, hydrocephalus, extra-axial collection or mass lesion/mass effect. Global volume loss. Unchanged, nonacute encephalomalacia of the subcortical left frontal lobe (series 2, image  20). Vascular: No hyperdense vessel or unexpected calcification. Skull: Normal. Negative for fracture or focal lesion. Sinuses/Orbits: No acute finding. Other: None. IMPRESSION: Global volume loss without acute intracranial pathology. Electronically Signed   By: Eddie Candle M.D.   On: 05/13/2019 18:01    EKG: Independently reviewed.  Sinus rhythm with RSR in leads V1 and V2.  Probable RVH.  No acute ST changes  Assessment/Plan: Active Problems:   Type 2 diabetes mellitus ([place])  Hyperlipidemia associated w/ type 2 DM goal <70   Hypertension associated with diabetes ([place])   Left Carotid artery occlusion   History of aortic valve replacement   Stage 3 chronic kidney disease   Right Carotid artery stenosis   TIA (transient ischemic attack)    This patient was discussed with the ED physician, including pertinent vitals, physical exam findings, labs, and imaging.  We also discussed care given by the ED provider.  1. TIA Observation on telemetry MRI/MRA head and neck Echocardiogram tomorrow Hemoglobin A1c, lipid panel in the morning PT/OT/speech therapy consult Full aspirin 2. History of stroke 3. Carotid artery stenosis a.  4. Hypertension a. As symptoms have resolved, will continue good control of the patient's hypertension 5. Hyperlipidemia a. Lipid panel in the morning 6. Type 2 diabetes a. Continue home insulin with sliding scale  DVT prophylaxis: Lovenox Consultants: Teleneurology Code Status: Full Family Communication: Wife present Disposition Plan: Patient should be able to return home following evaluation   Truett Mainland, DO

## 2019-05-13 NOTE — Consult Note (Signed)
[facility] Tele[facility]urology Consult Services  Stat Consult  Date of Service:   05/13/2019 17:05:14  Impression:     .  Rule Out Acute Ischemic Stroke     .  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 Presents after having transient right sided weakness that lasted about 2 hours. Head CT: No acute Intracranial abnormality. NIHSS 1 (chronic deficit) Presentation is likely TIA, but cannot rule out Acute Ischemic Stroke just based on clinical presentation. Currently at baseline therefore not a candidate for IV Alteplase. Symptoms not suggestive of Large Vessel Occlusion.  CT HEAD: Reviewed  Metrics: [facility] Notification Time: 05/13/2019 17:03:53 Stamp Time: 05/13/2019 17:05:14 Callback Response Time: 05/13/2019 17:07:51 Video Start Time: 05/13/2019 18:01:45 Video End Time: 05/13/2019 18:11:42  Our recommendations are outlined below.  Recommendations:     .  Antiplatelet Therapy  Imaging Studies:     .  MRI Head Without Contrast     .  MRA Head and Neck Without Contrast When Available - Stroke Protocol     .  Echocardiogram - Transthoracic Echocardiogram  Therapies:     .  Physical Therapy  Disposition: Neurology Follow Up Recommended  Sign Out:     .  Discussed with Emergency Department Provider  ----------------------------------------------------------------------------------------------------  Chief Complaint: right sided weakness  History of Present Illness: Patient is a 78 year old Male.  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 Presents after having transient right sided weakness that lasted about 2 hours. Chart reviewed for Pertinent medical, surgical, family history. Chart reviewed for Medications list and allergies. Pertinent positive and negative review of systems noted in History of Present Illness.   Past Medical History:     . Hypertension     . Diabetes Mellitus     . Hyperlipidemia     . Coronary Artery Disease     . Stroke  Anticoagulant use:  No  Antiplatelet use: aspirin Examination: BP(130/61), Pulse(64), Blood Glucose(148) 1A: Level of Consciousness - Alert; keenly responsive + 0 1B: Ask Month and Age - Both Questions Right + 0 1C: Blink Eyes & Squeeze Hands - Performs Both Tasks + 0 2: Test Horizontal Extraocular Movements - Normal + 0 3: Test Visual Fields - No Visual Loss + 0 4: Test Facial Palsy (Use Grimace if Obtunded) - Minor paralysis (flat nasolabial fold, smile asymmetry) + 1 5A: Test Left Arm Motor Drift - No Drift for 10 Seconds + 0 5B: Test Right Arm Motor Drift - No Drift for 10 Seconds + 0 6A: Test Left Leg Motor Drift - No Drift for 5 Seconds + 0 6B: Test Right Leg Motor Drift - No Drift for 5 Seconds + 0 7: Test Limb Ataxia (FNF/Heel-Shin) - No Ataxia + 0 8: Test Sensation - Normal; No sensory loss + 0 9: Test Language/Aphasia - Normal; No aphasia + 0 10: Test Dysarthria - Normal + 0 11: Test Extinction/Inattention - No abnormality + 0  NIHSS Score: 1  Patient/Family was informed the Neurology Consult would happen via TeleHealth consult by way of interactive audio and video telecommunications and consented to receiving care in this manner.  Due to the immediate potential  for life-threatening deterioration due to underlying acute neurologic illness, I spent -10 minutes providing critical care. This time includes time for face to face visit via telemedicine, review of medical records, imaging studies and discussion of findings with providers, the patient and/or family.   Dr Elenor Quinones   [facility] [phone]   Case [id number]

## 2019-05-13 NOTE — ED Triage Notes (Signed)
Pt reports while eating lunch with his wife around 3pm he began having jerking and shaking in his right arm and was not feeling well. He went outside and told her he wanted to go home. He was having some trouble walking, trouble with comprehension, and right sided facial droop. This has resolved and pt feels much better at this time. Has happened several times this week lasting much shorter.

## 2019-05-13 NOTE — ED Notes (Signed)
Not a code stroke, only neuro consult due to resolution of symptoms.

## 2019-05-13 NOTE — ED Provider Notes (Signed)
[facility] EMERGENCY DEPARTMENT Provider Note   CSN: [id number] Arrival date & time: 05/13/19  1653     History   Chief Complaint Chief Complaint  Patient presents with  . Code Stroke    HPI Evan Stanley is a 78 y.o. male.     Patient states that he started having weakness in his right arm and right leg.  He also had some facial drooping.  Patient has a history of a stroke.  The symptoms have all resolved now they lasted less than 2 hours  The history is provided by the patient and medical records.  Weakness Severity:  Moderate Onset quality:  Sudden Timing:  Intermittent Progression:  Resolved Chronicity:  New Context: not alcohol use   Relieved by:  Nothing Ineffective treatments:  None tried Associated symptoms: no abdominal pain, no chest pain, no cough, no diarrhea, no frequency, no headaches and no seizures     Past Medical History:  Diagnosis Date  . AKI (acute kidney injury) ([place])   . Allergy   . Anxiety   . Arthritis    Right shoulder  . Atrial fibrillation ([place])   . Benign essential HTN   . Carotid artery stenosis   . Cataract   . CHF (congestive heart failure) ([place])   . CVA (cerebral vascular accident) ([place]) 04/28/2016  . Decreased vision    left eye  . Diabetes mellitus without complication ([place])    Takes Metformin  . Hyperlipidemia   . Hypertension   . Salmonella bacteremia 04/29/2016  . Sepsis ([place]) 04/29/2016  . Stroke ([place]) 11/18/2015   no deficits  . TIA (transient ischemic attack)    affected left eye  . Urgency of urination     Patient Active Problem List   Diagnosis Date Noted  . TIA (transient ischemic attack) 05/13/2019  . Coronary arteriosclerosis in native artery 12/12/2018  . Type 2 diabetes mellitus ([place]) 12/12/2018  . Right Carotid artery stenosis 11/03/2017  . Crohn's disease ([place]) 04/30/2016  . Chronic kidney disease, stage 3 (moderate) ([place]) 04/29/2016  . Late effects of cerebrovascular disease 04/03/2016  . History  of aortic valve replacement 01/05/2016  . Left Carotid artery occlusion 11/18/2015  . Disorder of carotid artery ([place]) 11/18/2015  . Occlusion and stenosis of right carotid artery 10/26/2014  . Hyperlipidemia associated w/ type 2 DM goal <70 07/05/2013  . Hypertension associated with diabetes ([place]) 07/05/2013  . Hyperlipidemia, unspecified 07/05/2013    Past Surgical History:  Procedure Laterality Date  . AORTIC VALVE REPLACEMENT    . BACK SURGERY  80s  . CARDIAC VALVE REPLACEMENT  2010  . ENDARTERECTOMY Right 01/13/2016   Procedure: ENDARTERECTOMY CAROTID - RIGHT;  Surgeon: Conrad [place], MD;  Location: [facility];  Service: Vascular;  Laterality: Right;  . EYE SURGERY    . laser surgery, left eye  Left 10-29-2015    retinal surgery/ Deloria Lair MD   . PERIPHERAL VASCULAR CATHETERIZATION Right 11/18/2015   Procedure: Carotid Angiography;  Surgeon: Conrad Trout[place], MD;  Location: [facility];  Service: Cardiovascular;  Laterality: Right;  . PERIPHERAL VASCULAR CATHETERIZATION N/A 11/18/2015   Procedure: Aortic Arch Angiography;  Surgeon: Conrad Cary, MD;  Location: [facility];  Service: Cardiovascular;  Laterality: N/A;  . TEE WITHOUT CARDIOVERSION N/A 05/05/2016   Procedure: TRANSESOPHAGEAL ECHOCARDIOGRAM (TEE);  Surgeon: Lelon Perla, MD;  Location: [facility] ENDOSCOPY;  Service: Cardiovascular;  Laterality: N/A;        Home Medications  Prior to Admission medications   Medication Sig Start Date End Date Taking? Authorizing Provider  amLODipine (NORVASC) 10 MG tablet Take 0.5 tablets (5 mg total) by mouth daily. Patient taking differently: Take 10 mg by mouth daily.  04/25/19  Yes [name], DO  aspirin EC 81 MG tablet Take 1 tablet (81 mg total) by mouth daily. 08/16/15  Yes Belva Crome, MD  atorvastatin (LIPITOR) 40 MG tablet Take 1 tablet (40 mg total) by mouth daily. Patient taking differently: Take 40 mg by mouth every evening.  06/30/18  Yes Gottschalk, Leatrice Jewels  M, DO  cetirizine (ZYRTEC) 10 MG tablet Take 10 mg by mouth daily.   Yes [provider]  Dulaglutide (TRULICITY) 1.5 GT/3.6IW SOPN Inject 1.5 mg into the skin every Saturday.    Yes [provider]  hydrochlorothiazide (HYDRODIURIL) 25 MG tablet Take 1 tablet (25 mg total) by mouth daily. 11/29/18  Yes Gottschalk, Ashly M, DO  insulin lispro (HUMALOG KWIKPEN) 100 UNIT/ML KwikPen Inject 5-10 Units into the skin See admin instructions. 5 Units at breakfast if over 100, hold if under 100. Give 10 units at daily at supper   Yes [provider]  latanoprost (XALATAN) 0.005 % ophthalmic solution Place 1 drop into the left eye at bedtime.    Yes [provider]  lisinopril (PRINIVIL,ZESTRIL) 40 MG tablet Take 1 tablet (40 mg total) by mouth daily. 06/30/18  Yes Gottschalk, Leatrice Jewels M, DO  metoprolol succinate (TOPROL-XL) 100 MG 24 hr tablet TAKE 1 TABLET BY MOUTH  DAILY WITH OR IMMEDIATELY  FOLLOWING A MEAL. Patient taking differently: Take 100 mg by mouth every morning.  06/30/18  Yes Gottschalk, Ashly M, DO  TRESIBA FLEXTOUCH 200 UNIT/ML SOPN INJECT 80 UNITS  SUBCUTANEOUSLY DAILY Patient taking differently: Inject 76 Units into the skin every morning.  03/04/19  Yes [name], MD  vitamin C (ASCORBIC ACID) 500 MG tablet Take 500 mg by mouth daily.   Yes [provider]  vitamin E (VITAMIN E) 1000 UNIT capsule Take 1,000 Units by mouth daily.   Yes [provider]    Family History Family History  Problem Relation Age of Onset  . Heart disease Mother   . Cancer Mother        breast  . Stroke Father 53  . Hypertension Father   . Early death Sister        infant death  . Heart disease Brother   . Hydrocephalus Brother   . Heart disease Brother   . Diabetes Brother   . Cancer Brother        prostat  . Healthy Son   . Healthy Son     Social History Social History   Tobacco Use  . Smoking status: Former Smoker    Packs/day: 2.00     Years: 20.00    Pack years: 40.00    Types: Cigarettes    Quit date: 07/20/1986    Years since quitting: 32.8  . Smokeless tobacco: Never Used  Substance Use Topics  . Alcohol use: No    Alcohol/week: 0.0 standard drinks  . Drug use: No     Allergies   Patient has no known allergies.   Review of Systems Review of Systems  Constitutional: Negative for appetite change and fatigue.  HENT: Negative for congestion, ear discharge and sinus pressure.   Eyes: Negative for discharge.  Respiratory: Negative for cough.   Cardiovascular: Negative for chest pain.  Gastrointestinal: Negative for abdominal  pain and diarrhea.  Genitourinary: Negative for frequency and hematuria.  Musculoskeletal: Negative for back pain.  Skin: Negative for rash.  Neurological: Positive for weakness. Negative for seizures and headaches.  Psychiatric/Behavioral: Negative for hallucinations.     Physical Exam Updated Vital Signs BP (!) 146/69 (BP Location: Right Arm)   Pulse 67   Temp 97.6 F (36.4 C) (Oral)   Resp 19   Ht 5' 7"  (1.702 m)   Wt 82.1 kg   SpO2 98%   BMI 28.35 kg/m   Physical Exam Vitals signs and nursing note reviewed.  Constitutional:      Appearance: He is well-developed.  HENT:     Head: Normocephalic.     Nose: Nose normal.  Eyes:     General: No scleral icterus.    Conjunctiva/sclera: Conjunctivae normal.  Neck:     Musculoskeletal: Neck supple.     Thyroid: No thyromegaly.  Cardiovascular:     Rate and Rhythm: Normal rate and regular rhythm.     Heart sounds: No murmur. No friction rub. No gallop.   Pulmonary:     Breath sounds: No stridor. No wheezing or rales.  Chest:     Chest wall: No tenderness.  Abdominal:     General: There is no distension.     Tenderness: There is no abdominal tenderness. There is no rebound.  Musculoskeletal: Normal range of motion.  Lymphadenopathy:     Cervical: No cervical adenopathy.  Skin:    Findings: No erythema or rash.   Neurological:     Mental Status: He is oriented to person, place, and time.     Motor: No abnormal muscle tone.     Coordination: Coordination normal.  Psychiatric:        Behavior: Behavior normal.      ED Treatments / Results  Labs (all labs ordered are listed, but only abnormal results are displayed) Labs Reviewed  COMPREHENSIVE METABOLIC PANEL - Abnormal; Notable for the following components:      Result Value   Glucose, Bld 158 (*)    BUN 35 (*)    Creatinine, Ser 1.98 (*)    GFR calc non Af Amer 32 (*)    GFR calc Af Amer 37 (*)    All other components within normal limits  I-STAT CHEM 8, ED - Abnormal; Notable for the following components:   BUN 31 (*)    Creatinine, Ser 2.00 (*)    Glucose, Bld 148 (*)    All other components within normal limits  ETHANOL  PROTIME-INR  APTT  CBC  DIFFERENTIAL  RAPID URINE DRUG SCREEN, HOSP PERFORMED  URINALYSIS, ROUTINE W REFLEX MICROSCOPIC    EKG None  Radiology Ct Head Wo Contrast  Result Date: 05/13/2019 CLINICAL DATA:  Altered mental status, abnormal movements, difficulty walking EXAM: CT HEAD WITHOUT CONTRAST TECHNIQUE: Contiguous axial images were obtained from the base of the skull through the vertex without intravenous contrast. COMPARISON:  04/26/2016 FINDINGS: Brain: No evidence of acute infarction, hemorrhage, hydrocephalus, extra-axial collection or mass lesion/mass effect. Global volume loss. Unchanged, nonacute encephalomalacia of the subcortical left frontal lobe (series 2, image 20). Vascular: No hyperdense vessel or unexpected calcification. Skull: Normal. Negative for fracture or focal lesion. Sinuses/Orbits: No acute finding. Other: None. IMPRESSION: Global volume loss without acute intracranial pathology. Electronically Signed   By: Eddie Candle M.D.   On: 05/13/2019 18:01    Procedures Procedures (including critical care time)  Medications Ordered in ED Medications - No data  to display   Initial  Impression / Assessment and Plan / ED Course  I have reviewed the triage vital signs and the nursing notes.  Pertinent labs & imaging results that were available during my care of the patient were reviewed by me and considered in my medical decision making (see chart for details).        CRITICAL CARE Performed by: Milton Ferguson Total critical care time: 45 minutes Critical care time was exclusive of separately billable procedures and treating other patients. Critical care was necessary to treat or prevent imminent or life-threatening deterioration. Critical care was time spent personally by me on the following activities: development of treatment plan with patient and/or surrogate as well as nursing, discussions with consultants, evaluation of patient's response to treatment, examination of patient, obtaining history from patient or surrogate, ordering and performing treatments and interventions, ordering and review of laboratory studies, ordering and review of radiographic studies, pulse oximetry and re-evaluation of patient's condition.  Patient with TIA symptoms.  Patient was seen by neurology and the patient will be admitted for TIA work-up Final Clinical Impressions(s) / ED Diagnoses   Final diagnoses:  TIA (transient ischemic attack)    ED Discharge Orders    None       Milton Ferguson, MD 05/13/19 1850

## 2019-05-13 NOTE — ED Notes (Signed)
Pt's blood glucose=61. Pt has not eaten today. Pt provided meal tray and will recheck blood glucose afterwards. Pt remains asymptomatic.

## 2019-05-13 NOTE — ED Notes (Signed)
On initial exam, pt had some trouble naming objects and describing picture.

## 2019-05-13 NOTE — ED Notes (Signed)
Code Stroke canceled.

## 2019-05-14 ENCOUNTER — Observation Stay (HOSPITAL_COMMUNITY): Payer: Medicare Other

## 2019-05-14 ENCOUNTER — Encounter (HOSPITAL_COMMUNITY): Payer: Self-pay | Admitting: Emergency Medicine

## 2019-05-14 DIAGNOSIS — Z7982 Long term (current) use of aspirin: Secondary | ICD-10-CM | POA: Diagnosis not present

## 2019-05-14 DIAGNOSIS — I6521 Occlusion and stenosis of right carotid artery: Secondary | ICD-10-CM | POA: Diagnosis not present

## 2019-05-14 DIAGNOSIS — Z20828 Contact with and (suspected) exposure to other viral communicable diseases: Secondary | ICD-10-CM | POA: Diagnosis not present

## 2019-05-14 DIAGNOSIS — R531 Weakness: Secondary | ICD-10-CM | POA: Diagnosis not present

## 2019-05-14 DIAGNOSIS — R29810 Facial weakness: Secondary | ICD-10-CM | POA: Diagnosis not present

## 2019-05-14 DIAGNOSIS — E785 Hyperlipidemia, unspecified: Secondary | ICD-10-CM | POA: Diagnosis not present

## 2019-05-14 DIAGNOSIS — Z87891 Personal history of nicotine dependence: Secondary | ICD-10-CM | POA: Diagnosis not present

## 2019-05-14 DIAGNOSIS — N183 Chronic kidney disease, stage 3 unspecified: Secondary | ICD-10-CM | POA: Diagnosis not present

## 2019-05-14 DIAGNOSIS — Z952 Presence of prosthetic heart valve: Secondary | ICD-10-CM | POA: Diagnosis not present

## 2019-05-14 DIAGNOSIS — I2510 Atherosclerotic heart disease of native coronary artery without angina pectoris: Secondary | ICD-10-CM | POA: Diagnosis not present

## 2019-05-14 DIAGNOSIS — I130 Hypertensive heart and chronic kidney disease with heart failure and stage 1 through stage 4 chronic kidney disease, or unspecified chronic kidney disease: Secondary | ICD-10-CM | POA: Diagnosis not present

## 2019-05-14 DIAGNOSIS — Z8673 Personal history of transient ischemic attack (TIA), and cerebral infarction without residual deficits: Secondary | ICD-10-CM | POA: Diagnosis not present

## 2019-05-14 DIAGNOSIS — Z794 Long term (current) use of insulin: Secondary | ICD-10-CM | POA: Diagnosis not present

## 2019-05-14 DIAGNOSIS — E1122 Type 2 diabetes mellitus with diabetic chronic kidney disease: Secondary | ICD-10-CM | POA: Diagnosis not present

## 2019-05-14 DIAGNOSIS — I509 Heart failure, unspecified: Secondary | ICD-10-CM | POA: Diagnosis not present

## 2019-05-14 DIAGNOSIS — Z79899 Other long term (current) drug therapy: Secondary | ICD-10-CM | POA: Diagnosis not present

## 2019-05-14 DIAGNOSIS — I6522 Occlusion and stenosis of left carotid artery: Secondary | ICD-10-CM | POA: Diagnosis not present

## 2019-05-14 LAB — HEMOGLOBIN A1C
Hgb A1c MFr Bld: 6.6 % — ABNORMAL HIGH (ref 4.8–5.6)
Mean Plasma Glucose: 142.72 mg/dL

## 2019-05-14 LAB — CBG MONITORING, ED
Glucose-Capillary: 115 mg/dL — ABNORMAL HIGH (ref 70–99)
Glucose-Capillary: 127 mg/dL — ABNORMAL HIGH (ref 70–99)
Glucose-Capillary: 75 mg/dL (ref 70–99)
Glucose-Capillary: 82 mg/dL (ref 70–99)
Glucose-Capillary: 88 mg/dL (ref 70–99)
Glucose-Capillary: 88 mg/dL (ref 70–99)

## 2019-05-14 LAB — SARS CORONAVIRUS 2 (TAT 6-24 HRS): SARS Coronavirus 2: NEGATIVE

## 2019-05-14 LAB — LIPID PANEL
Cholesterol: 103 mg/dL (ref 0–200)
HDL: 27 mg/dL — ABNORMAL LOW (ref >40)
LDL Cholesterol: 56 mg/dL (ref 0–99)
Total CHOL/HDL Ratio: 3.8 RATIO
Triglycerides: 100 mg/dL (ref <150)
VLDL: 20 mg/dL (ref 0–40)

## 2019-05-14 MED ORDER — LATANOPROST 0.005 % OP SOLN
1.0000 [drp] | Freq: Every day | OPHTHALMIC | Status: DC
  Administered 2019-05-14: 1 [drp] via OPHTHALMIC
  Filled 2019-05-14 (×2): qty 2.5

## 2019-05-14 MED ORDER — ASPIRIN EC 325 MG PO TBEC
325.0000 mg | DELAYED_RELEASE_TABLET | Freq: Every day | ORAL | Status: DC
  Administered 2019-05-14 – 2019-05-15 (×2): 325 mg via ORAL
  Filled 2019-05-14 (×2): qty 1

## 2019-05-14 MED ORDER — INSULIN GLARGINE 100 UNIT/ML ~~LOC~~ SOLN
76.0000 [IU] | Freq: Every morning | SUBCUTANEOUS | Status: DC
  Administered 2019-05-14 – 2019-05-15 (×2): 76 [IU] via SUBCUTANEOUS
  Filled 2019-05-14 (×3): qty 0.76

## 2019-05-14 NOTE — ED Notes (Signed)
Family at bedside. 

## 2019-05-14 NOTE — ED Notes (Signed)
Report to given to [facility],

## 2019-05-14 NOTE — Progress Notes (Signed)
Patient does not want echo at this time. Patient asked me why they ordered echo, I explained to him it was protocol for stroke and stroke like symptoms. He told me he had not had a stroke. I told the patient he had not had an echo since 2017, was he sure he didn't want the echo. He said no he didn't want it.

## 2019-05-14 NOTE — Progress Notes (Signed)
PROGRESS NOTE    Evan Stanley  YQM:[id number] DOB: 01-18-1941 DOA: 05/13/2019 PCP: Evan Norlander, DO   Brief Narrative:  Per HPI: Evan Stanley is a 78 y.o. male with a history of stroke with minimal residual left-sided weakness, hypertension, CHF, diabetes, hyperlipidemia, stage III chronic Stanley disease.  Patient seen for right facial droop and difficulty coordinating movements with right arm that started approximately 2:00.  Patient was brought to the emergency department where his symptoms had resolved.  No palliating or provoking factors.  Emergency Department Course: Teleneurology evaluated patient and recommended admission for TIA work-up.  CT of the head showed no new acute process  10/25: Patient wanted to go home earlier, but understands that he needs to stay for further evaluation and is agreeable to transfer to [facility] for further evaluation with brain MRI.  He has refused his 2D echocardiogram today.  He is currently awaiting transfer and in stable condition.  Assessment & Plan:   Active Problems:   Type 2 diabetes mellitus (HCC)   Hyperlipidemia associated w/ type 2 DM goal <70   Hypertension associated with diabetes ([place])   Left Carotid artery occlusion   History of aortic valve replacement   Stage 3 chronic Stanley disease   Right Carotid artery stenosis   TIA (transient ischemic attack)   Facial droop with right arm weakness suspect TIA versus CVA -Patient has prior history of CVA -Continue telemetry monitoring -Patient has refused 2D echocardiogram for now and this can be reconsidered after MRI -LDL of 56 with hemoglobin A1c pending -PT/OT/speech evaluation pending -Start full dose aspirin, continue statin -Blood pressure medications discontinued for now to allow permissive hypertension  Carotid artery stenosis -MRI/MRA of head and neck pending upon transfer for further evaluation -Continue aspirin and statin for now  Hypertension -Allow permissive  hypertension and hold home medications until brain MRI returns -May resume after if no CVA present  Dyslipidemia -Lipid panel with good control noted -Continue statin  Type 2 diabetes -No significant hyperglycemia noted, continue SSI  DVT prophylaxis: Lovenox Code Status: Full Family Communication: Discussed with wife Evan Stanley on phone Disposition Plan: Plan to transfer to [facility] for brain MRI/MRA   Consultants:   Teleneurology  Procedures:   None  Antimicrobials:   None   Subjective: Patient seen and evaluated today with no new acute complaints or concerns. No acute concerns or events noted overnight.  He states that he is feeling better this morning, but continues to have a mild facial droop.  Objective: Vitals:   05/14/19 0445 05/14/19 0530 05/14/19 0730 05/14/19 0800  BP:  134/68 135/80 120/80  Pulse: 65 65 67 68  Resp: 18 20 16 19   Temp:      TempSrc:      SpO2: 97% 96% 97% 100%  Weight:      Height:        Intake/Output Summary (Last 24 hours) at 05/14/2019 1037 Last data filed at 05/14/2019 0800 Gross per 24 hour  Intake -  Output 400 ml  Net -400 ml   Filed Weights   05/13/19 1706  Weight: 82.1 kg    Examination:  General exam: Appears calm and comfortable  Respiratory system: Clear to auscultation. Respiratory effort normal. Cardiovascular system: S1 & S2 heard, RRR. No JVD, murmurs, rubs, gallops or clicks. No pedal edema. Gastrointestinal system: Abdomen is nondistended, soft and nontender. No organomegaly or masses felt. Normal bowel sounds heard. Central nervous system: Alert and oriented.  Mild facial droop.  Would  not allow full neurological exam. Extremities: Symmetric 5 x 5 power. Skin: No rashes, lesions or ulcers Psychiatry: Mildly agitated.    Data Reviewed: I have personally reviewed following labs and imaging studies  CBC: Recent Labs  Lab 05/13/19 1645 05/13/19 1715  WBC 9.7  --   NEUTROABS 6.4  --   HGB 14.7 15.3   HCT 46.4 45.0  MCV 83.9  --   PLT 184  --    Basic Metabolic Panel: Recent Labs  Lab 05/13/19 1645 05/13/19 1715  NA 139 142  K 4.3 4.2  CL 106 105  CO2 25  --   GLUCOSE 158* 148*  BUN 35* 31*  CREATININE 1.98* 2.00*  CALCIUM 9.3  --    GFR: Estimated Creatinine Clearance: 32.2 mL/min (A) (by C-G formula based on SCr of 2 mg/dL (H)). Liver Function Tests: Recent Labs  Lab 05/13/19 1645  AST 26  ALT 44  ALKPHOS 92  BILITOT 0.7  PROT 7.4  ALBUMIN 4.3   No results for input(s): LIPASE, AMYLASE in the last 168 hours. No results for input(s): AMMONIA in the last 168 hours. Coagulation Profile: Recent Labs  Lab 05/13/19 1645  INR 1.0   Cardiac Enzymes: No results for input(s): CKTOTAL, CKMB, CKMBINDEX, TROPONINI in the last 168 hours. BNP (last 3 results) No results for input(s): PROBNP in the last 8760 hours. HbA1C: No results for input(s): HGBA1C in the last 72 hours. CBG: Recent Labs  Lab 05/13/19 2318 05/14/19 0018 05/14/19 0451 05/14/19 0838  GLUCAP 61* 88 127* 82   Lipid Profile: Recent Labs    05/14/19 0413  CHOL 103  HDL 27*  LDLCALC 56  TRIG 100  CHOLHDL 3.8   Thyroid Function Tests: No results for input(s): TSH, T4TOTAL, FREET4, T3FREE, THYROIDAB in the last 72 hours. Anemia Panel: No results for input(s): VITAMINB12, FOLATE, FERRITIN, TIBC, IRON, RETICCTPCT in the last 72 hours. Sepsis Labs: No results for input(s): PROCALCITON, LATICACIDVEN in the last 168 hours.  Recent Results (from the past 240 hour(s))  SARS CORONAVIRUS 2 (TAT 6-24 HRS) Nasopharyngeal Nasopharyngeal Swab     Status: None   Collection Time: 05/13/19  7:16 PM   Specimen: Nasopharyngeal Swab  Result Value Ref Range Status   SARS Coronavirus 2 NEGATIVE NEGATIVE Final    Comment: (NOTE) SARS-CoV-2 target nucleic acids are NOT DETECTED. The SARS-CoV-2 RNA is generally detectable in upper and lower respiratory specimens during the acute phase of infection.  Negative results do not preclude SARS-CoV-2 infection, do not rule out co-infections with other pathogens, and should not be used as the sole basis for treatment or other patient management decisions. Negative results must be combined with clinical observations, patient history, and epidemiological information. The expected result is Negative. Fact Sheet for Patients: SugarRoll.be Fact Sheet for Healthcare Providers: https://www.woods-mathews.com/ This test is not yet approved or cleared by the [place] FDA and  has been authorized for detection and/or diagnosis of SARS-CoV-2 by FDA under an Emergency Use Authorization (EUA). This EUA will remain  in effect (meaning this test can be used) for the duration of the COVID-19 declaration under Section 56 4(b)(1) of the Act, 21 U.S.C. section 360bbb-3(b)(1), unless the authorization is terminated or revoked sooner. Performed at [facility], [place] [address], [place], [place] [address]          Radiology Studies: Ct Head Wo Contrast  Result Date: 05/13/2019 CLINICAL DATA:  Altered mental status, abnormal movements, difficulty walking EXAM: CT HEAD  WITHOUT CONTRAST TECHNIQUE: Contiguous axial images were obtained from the base of the skull through the vertex without intravenous contrast. COMPARISON:  04/26/2016 FINDINGS: Brain: No evidence of acute infarction, hemorrhage, hydrocephalus, extra-axial collection or mass lesion/mass effect. Global volume loss. Unchanged, nonacute encephalomalacia of the subcortical left frontal lobe (series 2, image 20). Vascular: No hyperdense vessel or unexpected calcification. Skull: Normal. Negative for fracture or focal lesion. Sinuses/Orbits: No acute finding. Other: None. IMPRESSION: Global volume loss without acute intracranial pathology. Electronically Signed   By: Eddie Candle M.D.   On: 05/13/2019 18:01        Scheduled Meds: .  stroke: mapping  our early stages of recovery book   Does not apply Once  . amLODipine  10 mg Oral Daily  . atorvastatin  40 mg Oral QPM  . enoxaparin (LOVENOX) injection  40 mg Subcutaneous Q24H  . hydrochlorothiazide  25 mg Oral Daily  . insulin aspart  0-15 Units Subcutaneous TID WC  . insulin aspart  0-5 Units Subcutaneous QHS  . insulin aspart  10 Units Subcutaneous Q supper  . insulin glargine  76 Units Subcutaneous q morning - 10a  . latanoprost  1 drop Left Eye QHS  . lisinopril  40 mg Oral Daily  . loratadine  10 mg Oral Daily  . metoprolol succinate  100 mg Oral q morning - 10a   Continuous Infusions:   LOS: 0 days    Time spent: 30 minutes    Kasee Hantz Darleen Crocker, DO [facility] Pager [phone]  If 7PM-7AM, please contact night-coverage [email or web address] Password [facility] 05/14/2019, 10:37 AM

## 2019-05-14 NOTE — ED Notes (Signed)
This RN went to introduce myself at shift change. Explained delay to patient, listened to pt frustrations and assured pt and family comfort needs were met at this time. Pt requested his Lipitor at this time which was refused earlier.

## 2019-05-14 NOTE — ED Notes (Signed)
Called AC for latanoprost eye drop.

## 2019-05-14 NOTE — ED Notes (Signed)
Pt's wife [name]) can be reached @ [phone]. Please notify her for any changes in pt condition as well as when pt is transferred to [facility].

## 2019-05-14 NOTE — ED Notes (Signed)
[facility] here to transport pt

## 2019-05-14 NOTE — ED Notes (Signed)
When called [facility] at first of my shift to see how long it woud be tell they sent a truck - they said he was not on the list to be picked up

## 2019-05-14 NOTE — ED Notes (Signed)
Spoke with phil at [facility] and notified of bed ready.

## 2019-05-14 NOTE — ED Notes (Addendum)
Pt began yelling at this RN regarding why he was still here. Informed pt that his bed is ready and that transportation has been called and will soon be on their way.  Pt began yelling " I can do the same damn thing yall are doing here, I dont want that shit you have"   I told pt that he will not yell or curse at me. I explained to him the delay and pt screamed " I am critical"

## 2019-05-14 NOTE — ED Notes (Signed)
Called [facility] for transport said well be awhile would not give time

## 2019-05-15 ENCOUNTER — Observation Stay (HOSPITAL_COMMUNITY): Payer: Medicare Other

## 2019-05-15 ENCOUNTER — Observation Stay (HOSPITAL_BASED_OUTPATIENT_CLINIC_OR_DEPARTMENT_OTHER): Payer: Medicare Other

## 2019-05-15 DIAGNOSIS — G459 Transient cerebral ischemic attack, unspecified: Secondary | ICD-10-CM | POA: Diagnosis not present

## 2019-05-15 DIAGNOSIS — I6522 Occlusion and stenosis of left carotid artery: Secondary | ICD-10-CM | POA: Diagnosis not present

## 2019-05-15 LAB — ECHOCARDIOGRAM COMPLETE
Height: 65 in
Weight: 2656.1 oz

## 2019-05-15 LAB — GLUCOSE, CAPILLARY: Glucose-Capillary: 90 mg/dL (ref 70–99)

## 2019-05-15 NOTE — Progress Notes (Signed)
  Echocardiogram 2D Echocardiogram has been performed.  Evan Stanley 05/15/2019, 10:40 AM

## 2019-05-15 NOTE — Discharge Summary (Signed)
Physician Discharge Summary  Evan Stanley TGG:[id number] DOB: Nov 30, 1940 DOA: 05/13/2019  PCP: [name], DO  Admit date: 05/13/2019 Discharge date: 05/15/2019  Admitted From: Home Disposition:  Home   Recommendations for Outpatient Follow-up:  1. Follow up with PCP in 1 week 2. Echocardiogram completed as part of CVA work up. Results pending at time of discharge. Please follow up as outpatient.   Discharge Condition: Stable CODE STATUS: Full  Diet recommendation: Heart healthy   Brief/Interim Summary: Rad Smithis a 78 y.o.malewith a history ofstroke with minimal residual left-sided weakness, hypertension, CHF, diabetes, hyperlipidemia,stage III chronic kidney disease. Patient seen for right facial droop and difficulty coordinating movements with right arm that started approximately 2:00. Patient was brought to [facility] Emergency Department where his symptoms had resolved. No palliating or provoking factors.  Work-up was negative for acute stroke.  Discharge Diagnoses:  Active Problems:   Type 2 diabetes mellitus (HCC)   Hyperlipidemia associated w/ type 2 DM goal <70   Hypertension associated with diabetes ([place])   Left Carotid artery occlusion   History of aortic valve replacement   Stage 3 chronic kidney disease   Right Carotid artery stenosis   TIA (transient ischemic attack)    Facial droop with right arm weakness suspect TIA -Patient has prior history of CVA with residual left sided weakness -MRI brain negative for acute stroke -MRA without emergent finding  -Echo PENDING  -PT OT    Carotid artery stenosis -Continue aspirin and statin   Hypertension -Resume home antihypertensive  Dyslipidemia -Continue statin  Type 2 diabetes, well controlled -Ha1c 6.6 -Resume home insulin regimen   CKD stage 3 -Baseline Cr 1.7-2 -Stable     Discharge Instructions  Discharge Instructions    Call MD for:  difficulty breathing, headache or  visual disturbances   Complete by: As directed    Call MD for:  extreme fatigue   Complete by: As directed    Call MD for:  persistant dizziness or light-headedness   Complete by: As directed    Call MD for:  persistant nausea and vomiting   Complete by: As directed    Call MD for:  severe uncontrolled pain   Complete by: As directed    Call MD for:  temperature >100.4   Complete by: As directed    Diet - low sodium heart healthy   Complete by: As directed    Discharge instructions   Complete by: As directed    You were cared for by a hospitalist during your hospital stay. If you have any questions about your discharge medications or the care you received while you were in the hospital after you are discharged, you can call the unit and ask to speak with the hospitalist on call if the hospitalist that took care of you is not available. Once you are discharged, your primary care physician will handle any further medical issues. Please note that NO REFILLS for any discharge medications will be authorized once you are discharged, as it is imperative that you return to your primary care physician (or establish a relationship with a primary care physician if you do not have one) for your aftercare needs so that they can reassess your need for medications and monitor your lab values.   Increase activity slowly   Complete by: As directed      Allergies as of 05/15/2019   No Known Allergies     Medication List    TAKE these medications   amLODipine  10 MG tablet Commonly known as: NORVASC Take 0.5 tablets (5 mg total) by mouth daily. What changed: how much to take   aspirin EC 81 MG tablet Take 1 tablet (81 mg total) by mouth daily.   atorvastatin 40 MG tablet Commonly known as: LIPITOR Take 1 tablet (40 mg total) by mouth daily. What changed: when to take this   cetirizine 10 MG tablet Commonly known as: ZYRTEC Take 10 mg by mouth daily.   HumaLOG KwikPen 100 UNIT/ML  KwikPen Generic drug: insulin lispro Inject 5-10 Units into the skin See admin instructions. 5 Units at breakfast if over 100, hold if under 100. Give 10 units at daily at supper   hydrochlorothiazide 25 MG tablet Commonly known as: HYDRODIURIL Take 1 tablet (25 mg total) by mouth daily.   latanoprost 0.005 % ophthalmic solution Commonly known as: XALATAN Place 1 drop into the left eye at bedtime.   lisinopril 40 MG tablet Commonly known as: ZESTRIL Take 1 tablet (40 mg total) by mouth daily.   metoprolol succinate 100 MG 24 hr tablet Commonly known as: TOPROL-XL TAKE 1 TABLET BY MOUTH  DAILY WITH OR IMMEDIATELY  FOLLOWING A MEAL. What changed:   how much to take  how to take this  when to take this  additional instructions   Tresiba FlexTouch 200 UNIT/ML Sopn Generic drug: Insulin Degludec INJECT 80 UNITS  SUBCUTANEOUSLY DAILY What changed: See the new instructions.   Trulicity 1.5 IW/5.8KD Sopn Generic drug: Dulaglutide Inject 1.5 mg into the skin every Saturday.   vitamin C 500 MG tablet Commonly known as: ASCORBIC ACID Take 500 mg by mouth daily.   vitamin E 1000 UNIT capsule Generic drug: vitamin E Take 1,000 Units by mouth daily.      Follow-up Information    [name], DO. Schedule an appointment as soon as possible for a visit in 1 week(s).   Specialty: Family Medicine Contact information: [place] [address] [phone]          No Known Allergies  Consultations:  None    Procedures/Studies: Ct Head Wo Contrast  Result Date: 05/13/2019 CLINICAL DATA:  Altered mental status, abnormal movements, difficulty walking EXAM: CT HEAD WITHOUT CONTRAST TECHNIQUE: Contiguous axial images were obtained from the base of the skull through the vertex without intravenous contrast. COMPARISON:  04/26/2016 FINDINGS: Brain: No evidence of acute infarction, hemorrhage, hydrocephalus, extra-axial collection or mass lesion/mass  effect. Global volume loss. Unchanged, nonacute encephalomalacia of the subcortical left frontal lobe (series 2, image 20). Vascular: No hyperdense vessel or unexpected calcification. Skull: Normal. Negative for fracture or focal lesion. Sinuses/Orbits: No acute finding. Other: None. IMPRESSION: Global volume loss without acute intracranial pathology. Electronically Signed   By: Eddie Candle M.D.   On: 05/13/2019 18:01   Mr Angio Neck Wo Contrast  Result Date: 05/15/2019 CLINICAL DATA:  TIA workup EXAM: MRA NECK WITHOUT CONTRAST TECHNIQUE: Angiographic images of the neck were obtained using MRA technique without intravenous contrast. COMPARISON:  11/02/2014 FINDINGS: Contrast was requested but patient has borderline GFR and patient's diffusion and time-of-flight images were reviewed and contrast was deferred at this time. Chronic left ICA occlusion with intracranial reconstitution by contemporaneous brain MRI. This was also seen on a 2016 neck CTA. No flow limiting stenosis or ulceration is seen in the right common carotid or RICA. There is high-grade narrowing of the right ECA with flow gap that was also seen on prior CTA. Reconstituted distal vessels is not  seen by MRI as compared to CTA. Robust flow is seen in both cervical vertebral arteries, although there is intracranial occlusion on the right by prior CTA and by contemporaneous brain MRI IMPRESSION: 1. Deferred contrast due to renal function. 2. No emergent finding. 3. Chronic left ICA occlusion. 4. Chronic severe right ECA stenosis. 5. Chronic distal right V4 segment occlusion Electronically Signed   By: Monte Fantasia M.D.   On: 05/15/2019 05:48   Mr Brain Wo Contrast  Result Date: 05/15/2019 CLINICAL DATA:  TIA workup EXAM: MRI HEAD WITHOUT CONTRAST TECHNIQUE: Multiplanar, multiecho pulse sequences of the brain and surrounding structures were obtained without intravenous contrast. COMPARISON:  11/19/2015 FINDINGS: Brain: No acute infarction,  hemorrhage, hydrocephalus, extra-axial collection or mass lesion. Advanced brain atrophy without specific pattern. Remote lacunar infarcts in the deep cerebral white matter. Vascular: Chronic occlusion of the left ICA in the neck with intracranial reconstitution. Chronic occlusion of the distal right V4 segment. Skull and upper cervical spine: Negative for marrow lesion Sinuses/Orbits: No acute finding IMPRESSION: 1. No acute finding including infarct. 2. Chronic occlusion of the left cervical ICA with intracranial reconstitution. Chronic occlusion of the distal right V4 segment. 3. Advanced brain atrophy and chronic small vessel ischemic injury without convincing change from 2017. Electronically Signed   By: Monte Fantasia M.D.   On: 05/15/2019 05:44       Discharge Exam: Vitals:   05/15/19 0312 05/15/19 0952  BP: (!) 150/118 121/63  Pulse: 81 61  Resp: 18 17  Temp: 97.7 F (36.5 C) 98 F (36.7 C)  SpO2: 96% 98%     General: Pt is alert, awake, not in acute distress Cardiovascular: RRR, S1/S2 +, no edema Respiratory: CTA bilaterally, no wheezing, no rhonchi, no respiratory distress, no conversational dyspnea  Abdominal: Soft, NT, ND, bowel sounds + Extremities: no edema, no cyanosis Neuro: alert, oriented, no focal deficits  Psych: Normal mood and affect, stable judgement and insight     The results of significant diagnostics from this hospitalization (including imaging, microbiology, ancillary and laboratory) are listed below for reference.     Microbiology: Recent Results (from the past 240 hour(s))  SARS CORONAVIRUS 2 (TAT 6-24 HRS) Nasopharyngeal Nasopharyngeal Swab     Status: None   Collection Time: 05/13/19  7:16 PM   Specimen: Nasopharyngeal Swab  Result Value Ref Range Status   SARS Coronavirus 2 NEGATIVE NEGATIVE Final    Comment: (NOTE) SARS-CoV-2 target nucleic acids are NOT DETECTED. The SARS-CoV-2 RNA is generally detectable in upper and lower respiratory  specimens during the acute phase of infection. Negative results do not preclude SARS-CoV-2 infection, do not rule out co-infections with other pathogens, and should not be used as the sole basis for treatment or other patient management decisions. Negative results must be combined with clinical observations, patient history, and epidemiological information. The expected result is Negative. Fact Sheet for Patients: SugarRoll.be Fact Sheet for Healthcare Providers: https://www.woods-mathews.com/ This test is not yet approved or cleared by the [place] FDA and  has been authorized for detection and/or diagnosis of SARS-CoV-2 by FDA under an Emergency Use Authorization (EUA). This EUA will remain  in effect (meaning this test can be used) for the duration of the COVID-19 declaration under Section 56 4(b)(1) of the Act, 21 U.S.C. section 360bbb-3(b)(1), unless the authorization is terminated or revoked sooner. Performed at [facility], [place] [address] [place], [place] [address]      Labs: BNP (last 3 results) No results  for input(s): BNP in the last 8760 hours. Basic Metabolic Panel: Recent Labs  Lab 05/13/19 1645 05/13/19 1715  NA 139 142  K 4.3 4.2  CL 106 105  CO2 25  --   GLUCOSE 158* 148*  BUN 35* 31*  CREATININE 1.98* 2.00*  CALCIUM 9.3  --    Liver Function Tests: Recent Labs  Lab 05/13/19 1645  AST 26  ALT 44  ALKPHOS 92  BILITOT 0.7  PROT 7.4  ALBUMIN 4.3   No results for input(s): LIPASE, AMYLASE in the last 168 hours. No results for input(s): AMMONIA in the last 168 hours. CBC: Recent Labs  Lab 05/13/19 1645 05/13/19 1715  WBC 9.7  --   NEUTROABS 6.4  --   HGB 14.7 15.3  HCT 46.4 45.0  MCV 83.9  --   PLT 184  --    Cardiac Enzymes: No results for input(s): CKTOTAL, CKMB, CKMBINDEX, TROPONINI in the last 168 hours. BNP: Invalid input(s): POCBNP CBG: Recent Labs  Lab 05/14/19 0838  05/14/19 1244 05/14/19 1730 05/14/19 2112 05/15/19 0855  GLUCAP 82 75 115* 88 90   D-Dimer No results for input(s): DDIMER in the last 72 hours. Hgb A1c Recent Labs    05/14/19 0413  HGBA1C 6.6*   Lipid Profile Recent Labs    05/14/19 0413  CHOL 103  HDL 27*  LDLCALC 56  TRIG 100  CHOLHDL 3.8   Thyroid function studies No results for input(s): TSH, T4TOTAL, T3FREE, THYROIDAB in the last 72 hours.  Invalid input(s): FREET3 Anemia work up No results for input(s): VITAMINB12, FOLATE, FERRITIN, TIBC, IRON, RETICCTPCT in the last 72 hours. Urinalysis    Component Value Date/Time   COLORURINE YELLOW 05/13/2019 2045   APPEARANCEUR CLEAR 05/13/2019 2045   LABSPEC 1.018 05/13/2019 2045   PHURINE 5.0 05/13/2019 2045   GLUCOSEU NEGATIVE 05/13/2019 2045   HGBUR NEGATIVE 05/13/2019 2045   BILIRUBINUR NEGATIVE 05/13/2019 2045   KETONESUR NEGATIVE 05/13/2019 2045   PROTEINUR NEGATIVE 05/13/2019 2045   UROBILINOGEN >8.0 (H) 10/30/2008 2328   NITRITE NEGATIVE 05/13/2019 2045   LEUKOCYTESUR NEGATIVE 05/13/2019 2045   Sepsis Labs Invalid input(s): PROCALCITONIN,  WBC,  LACTICIDVEN Microbiology Recent Results (from the past 240 hour(s))  SARS CORONAVIRUS 2 (TAT 6-24 HRS) Nasopharyngeal Nasopharyngeal Swab     Status: None   Collection Time: 05/13/19  7:16 PM   Specimen: Nasopharyngeal Swab  Result Value Ref Range Status   SARS Coronavirus 2 NEGATIVE NEGATIVE Final    Comment: (NOTE) SARS-CoV-2 target nucleic acids are NOT DETECTED. The SARS-CoV-2 RNA is generally detectable in upper and lower respiratory specimens during the acute phase of infection. Negative results do not preclude SARS-CoV-2 infection, do not rule out co-infections with other pathogens, and should not be used as the sole basis for treatment or other patient management decisions. Negative results must be combined with clinical observations, patient history, and epidemiological information. The  expected result is Negative. Fact Sheet for Patients: SugarRoll.be Fact Sheet for Healthcare Providers: https://www.woods-mathews.com/ This test is not yet approved or cleared by the [place] FDA and  has been authorized for detection and/or diagnosis of SARS-CoV-2 by FDA under an Emergency Use Authorization (EUA). This EUA will remain  in effect (meaning this test can be used) for the duration of the COVID-19 declaration under Section 56 4(b)(1) of the Act, 21 U.S.C. section 360bbb-3(b)(1), unless the authorization is terminated or revoked sooner. Performed at [facility], [place] [address], [place], [place] [address]  Patient was seen and examined on the day of discharge and was found to be in stable condition. Time coordinating discharge: 35 minutes including assessment and coordination of care, as well as examination of the patient.   SIGNED:  Dessa Phi, DO [facility] 05/15/2019, 2:34 PM

## 2019-05-15 NOTE — Progress Notes (Signed)
PT Cancellation Note  Patient Details Name: Evan Stanley MRN: [id number] DOB: 12-09-1940   Cancelled Treatment:    Reason Eval/Treat Not Completed: Other (comment).  Pt declined PT today after being seen by OT.  Demonstrates some obvious balance changes, but will not be able to fully assess.  Thank you for the PT referral.   Ramond Dial 05/15/2019, 12:43 PM   Mee Hives, PT MS Acute Rehab Dept. Number: [place]

## 2019-05-15 NOTE — Plan of Care (Signed)

## 2019-05-15 NOTE — Evaluation (Signed)
Occupational Therapy Evaluation Patient Details Name: Evan Stanley MRN: [id number] DOB: 04-19-1941 Today's Date: 05/15/2019    History of Present Illness 78 yo male with R facial droop and difficulty coordinating movement with symptoms resolving.CT negative MRI Negative for new event PMH: L side residual weakness HTN CHF DM hyperlipidemia CKD III L carotid artery occlusion R carotid artery stenosis    Clinical Impression   Patient evaluated by Occupational Therapy with no further acute OT needs identified. All education has been completed and the patient has no further questions. Pt was dressed and stating he was ready to go home to therapist on arrival. Wife is asking how to improve balance and pt states he does not want any follow up.  See below for any follow-up Occupational Therapy or equipment needs. OT to sign off. Thank you for referral.      Follow Up Recommendations  No OT follow up    Equipment Recommendations  None recommended by OT    Recommendations for Other Services       Precautions / Restrictions Precautions Precautions: Fall      Mobility Bed Mobility Overal bed mobility: Independent                Transfers Overall transfer level: Independent                    Balance Overall balance assessment: Mild deficits observed, not formally tested;History of Falls                               Standardized Balance Assessment Standardized Balance Assessment : Berg Balance Test;Dynamic Gait Index Berg Balance Test Sit to Stand: Able to stand  independently using hands Standing Unsupported: Able to stand safely 2 minutes Sitting with Back Unsupported but Feet Supported on Floor or Stool: Able to sit 2 minutes under supervision Stand to Sit: Controls descent by using hands Transfers: Able to transfer safely, definite need of hands Turn 360 Degrees: Able to turn 360 degrees safely but slowly Dynamic Gait Index Level Surface: Mild  Impairment Change in Gait Speed: Moderate Impairment Step Over Obstacle: Mild Impairment Step Around Obstacles: Mild Impairment     ADL either performed or assessed with clinical judgement   ADL Overall ADL's : At baseline                                       General ADL Comments: pt at baseline has      Vision Baseline Vision/History: No visual deficits       Perception     Praxis      Pertinent Vitals/Pain Pain Assessment: No/denies pain     Hand Dominance Right   Extremity/Trunk Assessment Upper Extremity Assessment Upper Extremity Assessment: LUE deficits/detail LUE Deficits / Details: L shoulder pain since June. Has full range of motion   Lower Extremity Assessment Lower Extremity Assessment: Defer to PT evaluation   Cervical / Trunk Assessment Cervical / Trunk Assessment: Normal   Communication Communication Communication: HOH leans with L ear toward staff    Cognition Arousal/Alertness: Awake/alert Behavior During Therapy: WFL for tasks assessed/performed Overall Cognitive Status: Within Functional Limits for tasks assessed  General Comments: wife reports baseline   General Comments  pt with baseline L side residual deficits and appears to be near baseline.    Exercises     Shoulder Instructions      Home Living Family/patient expects to be discharged to:: Private residence Living Arrangements: Spouse/significant other Available Help at Discharge: Available 24 hours/day Type of Home: House Home Access: Stairs to enter [employer] of Steps: 1 Entrance Stairs-Rails: None Home Layout: Laundry or work area in basement     [employer] Shower/Tub: Occupational psychologist: Standard Bathroom Accessibility: Yes How Accessible: Accessible via walker Home Equipment: Environmental consultant - 2 wheels   Additional Comments: [place] to see friends and walk the  course. reports fall in june at golf course [place] on an incline      Prior Functioning/Environment Level of Independence: Independent                 OT Problem List:        OT Treatment/Interventions:      OT Goals(Current goals can be found in the care plan section) Acute Rehab OT Goals Patient Stated Goal: to play golf OT Goal Formulation: With patient/family Potential to Achieve Goals: Good  OT Frequency:     Barriers to D/C:            Co-evaluation              AM-PAC OT "6 Clicks" Daily Activity     Outcome Measure Help from another person eating meals?: None Help from another person taking care of personal grooming?: None Help from another person toileting, which includes using toliet, bedpan, or urinal?: None Help from another person bathing (including washing, rinsing, drying)?: None Help from another person to put on and taking off regular upper body clothing?: None Help from another person to put on and taking off regular lower body clothing?: None 6 Click Score: 24   End of Session Nurse Communication: Mobility status;Precautions  Activity Tolerance: Patient tolerated treatment well Patient left: in chair;with call bell/phone within reach;with family/visitor present  OT Visit Diagnosis: Unsteadiness on feet (R26.81)                Time: 2536-6440 OT Time Calculation (min): 10 min Charges:  OT General Charges $OT Visit: 1 Visit OT Evaluation $OT Eval Low Complexity: 1 Low   Jeri Modena, OTR/L  Acute Rehabilitation Services Pager: [phone] Office: [phone] .   Jeri Modena 05/15/2019, 12:21 PM

## 2019-05-15 NOTE — Plan of Care (Signed)
  Problem: Education: Goal: Knowledge of General Education information will improve Description: Including pain rating scale, medication(s)/side effects and non-pharmacologic comfort measures 05/15/2019 1243 by Rance Muir, RN Outcome: Adequate for Discharge 05/15/2019 1139 by Rance Muir, RN Outcome: Progressing   Problem: Health Behavior/Discharge Planning: Goal: Ability to manage health-related needs will improve 05/15/2019 1243 by Rance Muir, RN Outcome: Adequate for Discharge 05/15/2019 1139 by Rance Muir, RN Outcome: Progressing   Problem: Clinical Measurements: Goal: Ability to maintain clinical measurements within normal limits will improve 05/15/2019 1243 by Rance Muir, RN Outcome: Adequate for Discharge 05/15/2019 1139 by Rance Muir, RN Outcome: Progressing Goal: Will remain free from infection 05/15/2019 1243 by Rance Muir, RN Outcome: Adequate for Discharge 05/15/2019 1139 by Rance Muir, RN Outcome: Progressing Goal: Diagnostic test results will improve 05/15/2019 1243 by Rance Muir, RN Outcome: Adequate for Discharge 05/15/2019 1139 by Rance Muir, RN Outcome: Progressing Goal: Respiratory complications will improve 05/15/2019 1243 by Rance Muir, RN Outcome: Adequate for Discharge 05/15/2019 1139 by Rance Muir, RN Outcome: Progressing Goal: Cardiovascular complication will be avoided 05/15/2019 1243 by Rance Muir, RN Outcome: Adequate for Discharge 05/15/2019 1139 by Rance Muir, RN Outcome: Progressing   Problem: Activity: Goal: Risk for activity intolerance will decrease 05/15/2019 1243 by Rance Muir, RN Outcome: Adequate for Discharge 05/15/2019 1139 by Rance Muir, RN Outcome: Progressing   Problem: Nutrition: Goal: Adequate nutrition will be maintained 05/15/2019 1243 by Rance Muir, RN Outcome: Adequate for Discharge 05/15/2019 1139 by Rance Muir, RN Outcome: Progressing   Problem: Coping: Goal: Level of anxiety will decrease 05/15/2019 1243 by Rance Muir,  RN Outcome: Adequate for Discharge 05/15/2019 1139 by Rance Muir, RN Outcome: Progressing   Problem: Elimination: Goal: Will not experience complications related to bowel motility 05/15/2019 1243 by Rance Muir, RN Outcome: Adequate for Discharge 05/15/2019 1139 by Rance Muir, RN Outcome: Progressing Goal: Will not experience complications related to urinary retention 05/15/2019 1243 by Rance Muir, RN Outcome: Adequate for Discharge 05/15/2019 1139 by Rance Muir, RN Outcome: Progressing   Problem: Pain Managment: Goal: General experience of comfort will improve 05/15/2019 1243 by Rance Muir, RN Outcome: Adequate for Discharge 05/15/2019 1139 by Rance Muir, RN Outcome: Progressing   Problem: Safety: Goal: Ability to remain free from injury will improve 05/15/2019 1243 by Rance Muir, RN Outcome: Adequate for Discharge 05/15/2019 1139 by Rance Muir, RN Outcome: Progressing   Problem: Skin Integrity: Goal: Risk for impaired skin integrity will decrease 05/15/2019 1243 by Rance Muir, RN Outcome: Adequate for Discharge 05/15/2019 1139 by Rance Muir, RN Outcome: Progressing

## 2019-05-17 LAB — GLUCOSE, CAPILLARY
Glucose-Capillary: 58 mg/dL — ABNORMAL LOW (ref 70–99)
Glucose-Capillary: 132 mg/dL — ABNORMAL HIGH (ref 70–99)

## 2019-05-22 ENCOUNTER — Telehealth: Payer: Self-pay | Admitting: Family Medicine

## 2019-05-23 ENCOUNTER — Other Ambulatory Visit: Payer: Self-pay

## 2019-05-23 ENCOUNTER — Encounter: Payer: Self-pay | Admitting: Family Medicine

## 2019-05-23 ENCOUNTER — Ambulatory Visit (INDEPENDENT_AMBULATORY_CARE_PROVIDER_SITE_OTHER): Payer: Medicare Other | Admitting: Family Medicine

## 2019-05-23 VITALS — BP 122/71 | HR 81 | Temp 98.9°F | Ht 65.0 in | Wt 177.0 lb

## 2019-05-23 DIAGNOSIS — G459 Transient cerebral ischemic attack, unspecified: Secondary | ICD-10-CM | POA: Diagnosis not present

## 2019-05-23 DIAGNOSIS — Z09 Encounter for follow-up examination after completed treatment for conditions other than malignant neoplasm: Secondary | ICD-10-CM

## 2019-05-23 DIAGNOSIS — R4589 Other symptoms and signs involving emotional state: Secondary | ICD-10-CM | POA: Diagnosis not present

## 2019-05-23 DIAGNOSIS — Z23 Encounter for immunization: Secondary | ICD-10-CM

## 2019-05-23 NOTE — Patient Instructions (Signed)
We talked about options for your mood.  I recommended talking to Nicki Reaper (his office is in our office here at [employer]) for stress management tips.  You want to hold off on this and try yourself.    We also talked about medications.  I was thinking about Mirtazapine for you, which can help with sleep.  Again, you didn't want to do medications for now but don't hesitate to reach out to me if you change your mind.   Stress Stress is a normal reaction to life events. Stress is what you feel when life demands more than you are used to, or more than you think you can handle. Some stress can be useful, such as studying for a test or meeting a deadline at work. Stress that occurs too often or for too long can cause problems. It can affect your emotional health and interfere with relationships and normal daily activities. Too much stress can weaken your body's defense system (immune system) and increase your risk for physical illness. If you already have a medical problem, stress can make it worse. What are the causes? All sorts of life events can cause stress. An event that causes stress for one person may not be stressful for another person. Major life events, whether positive or negative, commonly cause stress. Examples include:  Losing a job or starting a new job.  Losing a loved one.  Moving to a new town or home.  Getting married or divorced.  Having a baby.  Injury or illness. Less obvious life events can also cause stress, especially if they occur day after day or in combination with each other. Examples include:  Working long hours.  Driving in traffic.  Caring for children.  Being in debt.  Being in a difficult relationship. What are the signs or symptoms? Stress can cause emotional symptoms, including:  Anxiety. This is feeling worried, afraid, on edge, overwhelmed, or out of control.  Anger, including irritation or impatience.  Depression. This is feeling sad, down,  helpless, or guilty.  Trouble focusing, remembering, or making decisions. Stress can cause physical symptoms, including:  Aches and pains. These may affect your head, neck, back, stomach, or other areas of your body.  Tight muscles or a clenched jaw.  Low energy.  Trouble sleeping. Stress can cause unhealthy behaviors, including:  Eating to feel better (overeating) or skipping meals.  Working too much or putting off tasks.  Smoking, drinking alcohol, or using drugs to feel better. How is this diagnosed? Stress is diagnosed through an assessment by your health care provider. He or she may diagnose this condition based on:  Your symptoms and any stressful life events.  Your medical history.  Tests to rule out other causes of your symptoms. Depending on your condition, your health care provider may refer you to a specialist for further evaluation. How is this treated?  Stress management techniques are the recommended treatment for stress. Medicine is not typically recommended for the treatment of stress. Techniques to reduce your reaction to stressful life events include:  Stress identification. Monitor yourself for symptoms of stress and identify what causes stress for you. These skills may help you to avoid or prepare for stressful events.  Time management. Set your priorities, keep a calendar of events, and learn to say "no." Taking these actions can help you avoid making too many commitments. Techniques for coping with stress include:  Rethinking the problem. Try to think realistically about stressful events rather than ignoring them  or overreacting. Try to find the positives in a stressful situation rather than focusing on the negatives.  Exercise. Physical exercise can release both physical and emotional tension. The key is to find a form of exercise that you enjoy and do it regularly.  Relaxation techniques. These relax the body and mind. The key is to find one or more  that you enjoy and use the technique(s) regularly. Examples include: ? Meditation, deep breathing, or progressive relaxation techniques. ? Yoga or tai chi. ? Biofeedback, mindfulness techniques, or journaling. ? Listening to music, being out in nature, or participating in other hobbies.  Practicing a healthy lifestyle. Eat a balanced diet, drink plenty of water, limit or avoid caffeine, and get plenty of sleep.  Having a strong support network. Spend time with family, friends, or other people you enjoy being around. Express your feelings and talk things over with someone you trust. Counseling or talk therapy with a mental health professional may be helpful if you are having trouble managing stress on your own. Follow these instructions at home: Lifestyle   Avoid drugs.  Do not use any products that contain nicotine or tobacco, such as cigarettes and e-cigarettes. If you need help quitting, ask your health care provider.  Limit alcohol intake to no more than 1 drink a day for nonpregnant women and 2 drinks a day for men. One drink equals 12 oz of beer, 5 oz of wine, or 1 oz of hard liquor.  Do not use alcohol or drugs to relax.  Eat a balanced diet that includes fresh fruits and vegetables, whole grains, lean meats, fish, eggs, and beans, and low-fat dairy. Avoid processed foods and foods high in added fat, sugar, and salt.  Exercise at least 30 minutes on 5 or more days each week.  Get 7-8 hours of sleep each night. General instructions   Practice stress management techniques as discussed with your health care provider.  Drink enough fluid to keep your urine clear or pale yellow.  Take over-the-counter and prescription medicines only as told by your health care provider.  Keep all follow-up visits as told by your health care provider. This is important. Contact a health care provider if:  Your symptoms get worse.  You have new symptoms.  You feel overwhelmed by your  problems and can no longer manage them on your own. Get help right away if:  You have thoughts of hurting yourself or others. If you ever feel like you may hurt yourself or others, or have thoughts about taking your own life, get help right away. You can go to your nearest emergency department or call:  Your local emergency services (911 in the [place]  A suicide crisis helpline, such as the [facility] at (607)695-4487. This is open 24 hours a day. Summary  Stress is a normal reaction to life events. It can cause problems if it happens too often or for too long.  Practicing stress management techniques is the best way to treat stress.  Counseling or talk therapy with a mental health professional may be helpful if you are having trouble managing stress on your own. This information is not intended to replace advice given to you by your health care provider. Make sure you discuss any questions you have with your health care provider. Document Released: 12/30/2000 Document Revised: 06/18/2017 Document Reviewed: 08/26/2016 Elsevier Patient Education  2020 [employer].

## 2019-05-23 NOTE — Progress Notes (Signed)
Subjective: CC: Hospital follow-up for TIA PCP: [name], DO HPI:Evan Stanley is a 78 y.o. male presenting to clinic today for:  1.  TIA Patient was hospitalized on 05/13/2019 for TIA.  He notes that onset of symptoms was left facial droop, slurred speech and some difficulty moving his right upper extremity.  He notes chronic left eyelid droop and chronic left blindness.  He is treated with Xalatan for glaucoma in that eye.  He goes on to state that symptoms have resolved prior to discharge from the hospital.  He is compliant with his medications.  He has had no recurrent symptoms since that time.  They did not recommend follow-up with neurology on outpatient setting.  2.  Moodiness He wanted to make sure that he mentioned his fluctuating mood.  His wife seem to think that this is something that needed to be addressed.  He becomes easily irritated.  He would like to work through the stressful situations independently without medication or counseling services at this time if possible.   ROS: Per HPI  No Known Allergies Past Medical History:  Diagnosis Date  . AKI (acute kidney injury) ([place])   . Allergy   . Anxiety   . Arthritis    Right shoulder  . Atrial fibrillation ([place])   . Benign essential HTN   . Carotid artery stenosis   . Cataract   . CHF (congestive heart failure) ([place])   . CVA (cerebral vascular accident) ([place]) 04/28/2016  . Decreased vision    left eye  . Diabetes mellitus without complication ([place])    Takes Metformin  . Hyperlipidemia   . Hypertension   . Salmonella bacteremia 04/29/2016  . Sepsis ([place]) 04/29/2016  . Stroke ([place]) 11/18/2015   no deficits  . TIA (transient ischemic attack)    affected left eye  . Urgency of urination     Current Outpatient Medications:  .  amLODipine (NORVASC) 10 MG tablet, Take 0.5 tablets (5 mg total) by mouth daily. (Patient taking differently: Take 10 mg by mouth daily. ), Disp: 90 tablet, Rfl: 3 .  aspirin  EC 81 MG tablet, Take 1 tablet (81 mg total) by mouth daily., Disp: 90 tablet, Rfl: 3 .  atorvastatin (LIPITOR) 40 MG tablet, Take 1 tablet (40 mg total) by mouth daily. (Patient taking differently: Take 40 mg by mouth every evening. ), Disp: 90 tablet, Rfl: 3 .  cetirizine (ZYRTEC) 10 MG tablet, Take 10 mg by mouth daily., Disp: , Rfl:  .  Dulaglutide (TRULICITY) 1.5 WI/0.9BD SOPN, Inject 1.5 mg into the skin every Saturday. , Disp: , Rfl:  .  hydrochlorothiazide (HYDRODIURIL) 25 MG tablet, Take 1 tablet (25 mg total) by mouth daily., Disp: 90 tablet, Rfl: 1 .  insulin lispro (HUMALOG KWIKPEN) 100 UNIT/ML KwikPen, Inject 5-10 Units into the skin See admin instructions. 5 Units at breakfast if over 100, hold if under 100. Give 10 units at daily at supper, Disp: , Rfl:  .  latanoprost (XALATAN) 0.005 % ophthalmic solution, Place 1 drop into the left eye at bedtime. , Disp: , Rfl:  .  lisinopril (PRINIVIL,ZESTRIL) 40 MG tablet, Take 1 tablet (40 mg total) by mouth daily., Disp: 90 tablet, Rfl: 3 .  metoprolol succinate (TOPROL-XL) 100 MG 24 hr tablet, TAKE 1 TABLET BY MOUTH  DAILY WITH OR IMMEDIATELY  FOLLOWING A MEAL. (Patient taking differently: Take 100 mg by mouth every morning. ), Disp: 90 tablet, Rfl: 3 .  TRESIBA FLEXTOUCH 200  UNIT/ML SOPN, INJECT 80 UNITS  SUBCUTANEOUSLY DAILY (Patient taking differently: Inject 76 Units into the skin every morning. ), Disp: 36 mL, Rfl: 3 .  vitamin C (ASCORBIC ACID) 500 MG tablet, Take 500 mg by mouth daily., Disp: , Rfl:  .  vitamin E (VITAMIN E) 1000 UNIT capsule, Take 1,000 Units by mouth daily., Disp: , Rfl:  Social History   Socioeconomic History  . Marital status: Married    Spouse name: Not on file  . Number of children: 2  . Years of education: 52  . Highest education level: 11th grade  Occupational History  . Occupation: maintenance    Employer: [employer]    Comment: Retired  Scientific laboratory technician  . Financial resource strain: Not hard at all  . Food  insecurity    Worry: Never true    Inability: Never true  . Transportation needs    Medical: No    Non-medical: No  Tobacco Use  . Smoking status: Former Smoker    Packs/day: 2.00    Years: 20.00    Pack years: 40.00    Types: Cigarettes    Quit date: 07/20/1986    Years since quitting: 32.8  . Smokeless tobacco: Never Used  Substance and Sexual Activity  . Alcohol use: No    Alcohol/week: 0.0 standard drinks  . Drug use: No  . Sexual activity: Yes  Lifestyle  . Physical activity    Days per week: 3 days    Minutes per session: 30 min  . Stress: Only a little  Relationships  . Social connections    Talks on phone: More than three times a week    Gets together: More than three times a week    Attends religious service: Never    Active member of club or organization: Yes    Attends meetings of clubs or organizations: More than 4 times per year    Relationship status: Married  . Intimate partner violence    Fear of current or ex partner: No    Emotionally abused: No    Physically abused: No    Forced sexual activity: No  Other Topics Concern  . Not on file  Social History Narrative  . Not on file   Family History  Problem Relation Age of Onset  . Heart disease Mother   . Cancer Mother        breast  . Stroke Father 13  . Hypertension Father   . Early death Sister        infant death  . Heart disease Brother   . Hydrocephalus Brother   . Heart disease Brother   . Diabetes Brother   . Cancer Brother        prostat  . Healthy Son   . Healthy Son     Objective: Office vital signs reviewed. BP 122/71   Pulse 81   Temp 98.9 F (37.2 C) (Oral)   Ht 5' 5"  (1.651 m)   Wt 177 lb (80.3 kg)   SpO2 96%   BMI 29.45 kg/m   Physical Examination:  General: Awake, alert, well nourished, No acute distress HEENT: Normal; no carotid bruits appreciated Cardio: regular rate and rhythm, S1S2 heard, no murmurs appreciated Pulm: clear to auscultation bilaterally, no  wheezes, rhonchi or rales; normal work of breathing on room air Extremities: warm, well perfused, No edema, cyanosis or clubbing; +2 pulses bilaterally MSK: Normal gait and station Skin: dry; intact; no rashes or lesions Neuro: Cranial nerves II  through XII grossly intact with the exception of blindness in the left eye (this is chronic) Psych: Mood stable, speech normal, affect appropriate.  Becomes easily excited during the visit. Depression screen [facility] 2/9 05/23/2019 04/03/2019 12/06/2018  Decreased Interest 0 0 (No Data)  Down, Depressed, Hopeless 0 0 (No Data)  PHQ - 2 Score 0 0 -  Altered sleeping 0 - -  Tired, decreased energy 0 - -  Change in appetite 0 - -  Feeling bad or failure about yourself  0 - -  Trouble concentrating 0 - -  Moving slowly or fidgety/restless 0 - -  Suicidal thoughts 0 - -  PHQ-9 Score 0 - -  Difficult doing work/chores - - -    Assessment/ Plan: 78 y.o. male   1. Hospital discharge follow-up I reviewed the discharge summary and notes.  2. TIA (transient ischemic attack) No residual deficits.  Continues to take his statin, blood pressure medication and diabetic medication as directed.  3. Need for immunization against influenza Administered during today's visit - Flu Vaccine QUAD High Dose(Fluad)  4. Lisbeth Renshaw We talked about stress management.  I offered him referral to chronic care management, [facility], for counseling services but he declined and wishes to try and manage things independently.  Handout was provided on stress management.  I also offered him medication.  Considering mirtazapine.  For now, he would like to forego medicines but will follow-up with me if he changes his mind.    Orders Placed This Encounter  Procedures  . Flu Vaccine QUAD High Dose(Fluad)   No orders of the defined types were placed in this encounter.    [name], DO [place] [phone]

## 2019-05-24 ENCOUNTER — Ambulatory Visit (INDEPENDENT_AMBULATORY_CARE_PROVIDER_SITE_OTHER): Payer: Medicare Other | Admitting: *Deleted

## 2019-05-24 DIAGNOSIS — Z0000 Encounter for general adult medical examination without abnormal findings: Secondary | ICD-10-CM

## 2019-05-24 NOTE — Progress Notes (Signed)
MEDICARE ANNUAL WELLNESS VISIT  05/24/2019  Telephone Visit Disclaimer This Medicare AWV was conducted by telephone due to national recommendations for restrictions regarding the COVID-19 Pandemic (e.g. social distancing).  I verified, using two identifiers, that I am speaking with Evan Stanley or their authorized healthcare agent. I discussed the limitations, risks, security, and privacy concerns of performing an evaluation and management service by telephone and the potential availability of an in-person appointment in the future. The patient expressed understanding and agreed to proceed.   Subjective:  Evan Stanley is a 78 y.o. male patient of [name], DO who had a Medicare Annual Wellness Visit today via telephone. Evan Stanley is Retired and lives with his wife.He has 3 sons and 7 grandchildren. He reports that he is socially active and does interact with friends/family regularly. he is not physically active and enjoys playing golf, going to the mountains, and working in the yard.  Patient Care Team: [name], DO as PCP - General (Family Medicine) Belva Crome, MD as PCP - Cardiology (Cardiology) Conrad [place], MD as Consulting Physician (Vascular Surgery) Zadie Rhine Clent Demark, MD as Consulting Physician (Ophthalmology) Clent Jacks, MD as Consulting Physician (Ophthalmology)  Advanced Directives 05/24/2019 05/13/2019 12/06/2018 02/18/2018 11/10/2017 11/04/2016 04/28/2016  Does Patient Have a Medical Advance Directive? No No No No No No No  Would patient like information on creating a medical advance directive? No - Patient declined - No - Patient declined No - Patient declined No - Patient declined No - Patient declined No - patient declined information    Hospital Utilization Over the Past 12 Months: # of hospitalizations or ER visits: 1 # of surgeries: 0  Review of Systems    Patient reports that his overall health is unchanged compared to last year.  History  obtained from chart review and the patient  Patient Reported Readings (BP, Pulse, CBG, Weight, etc) none  Pain Assessment       Current Medications & Allergies (verified) Allergies as of 05/24/2019   No Known Allergies     Medication List       Accurate as of May 24, 2019  9:29 AM. If you have any questions, ask your nurse or doctor.        amLODipine 10 MG tablet Commonly known as: NORVASC Take 0.5 tablets (5 mg total) by mouth daily. What changed: how much to take   aspirin EC 81 MG tablet Take 1 tablet (81 mg total) by mouth daily.   atorvastatin 40 MG tablet Commonly known as: LIPITOR Take 1 tablet (40 mg total) by mouth daily. What changed: when to take this   cetirizine 10 MG tablet Commonly known as: ZYRTEC Take 10 mg by mouth daily.   HumaLOG KwikPen 100 UNIT/ML KwikPen Generic drug: insulin lispro Inject 5-10 Units into the skin See admin instructions. 5 Units at breakfast if over 100, hold if under 100. Give 10 units at daily at supper   hydrochlorothiazide 25 MG tablet Commonly known as: HYDRODIURIL Take 1 tablet (25 mg total) by mouth daily.   latanoprost 0.005 % ophthalmic solution Commonly known as: XALATAN Place 1 drop into the left eye at bedtime.   lisinopril 40 MG tablet Commonly known as: ZESTRIL Take 1 tablet (40 mg total) by mouth daily.   metoprolol succinate 100 MG 24 hr tablet Commonly known as: TOPROL-XL TAKE 1 TABLET BY MOUTH  DAILY WITH OR IMMEDIATELY  FOLLOWING A MEAL. What changed:   how  much to take  how to take this  when to take this  additional instructions   Tresiba FlexTouch 200 UNIT/ML Sopn Generic drug: Insulin Degludec INJECT 80 UNITS  SUBCUTANEOUSLY DAILY What changed: See the new instructions.   Trulicity 1.5 GE/9.5MW Sopn Generic drug: Dulaglutide Inject 1.5 mg into the skin every Saturday.   vitamin C 500 MG tablet Commonly known as: ASCORBIC ACID Take 500 mg by mouth daily.   vitamin E 1000  UNIT capsule Generic drug: vitamin E Take 1,000 Units by mouth daily.       History (reviewed): Past Medical History:  Diagnosis Date  . AKI (acute kidney injury) ([place])   . Allergy   . Anxiety   . Arthritis    Right shoulder  . Atrial fibrillation ([place])   . Benign essential HTN   . Carotid artery stenosis   . Cataract   . CHF (congestive heart failure) ([place])   . CVA (cerebral vascular accident) ([place]) 04/28/2016  . Decreased vision    left eye  . Diabetes mellitus without complication ([place])    Takes Metformin  . Hyperlipidemia   . Hypertension   . Salmonella bacteremia 04/29/2016  . Sepsis ([place]) 04/29/2016  . Stroke ([place]) 11/18/2015   no deficits  . TIA (transient ischemic attack)    affected left eye  . Urgency of urination    Past Surgical History:  Procedure Laterality Date  . AORTIC VALVE REPLACEMENT    . BACK SURGERY  80s  . CARDIAC VALVE REPLACEMENT  2010  . ENDARTERECTOMY Right 01/13/2016   Procedure: ENDARTERECTOMY CAROTID - RIGHT;  Surgeon: Conrad Hurdsfield, MD;  Location: [facility];  Service: Vascular;  Laterality: Right;  . EYE SURGERY    . laser surgery, left eye  Left 10-29-2015    retinal surgery/ Deloria Lair MD   . PERIPHERAL VASCULAR CATHETERIZATION Right 11/18/2015   Procedure: Carotid Angiography;  Surgeon: Conrad [place] Hills, MD;  Location: [facility];  Service: Cardiovascular;  Laterality: Right;  . PERIPHERAL VASCULAR CATHETERIZATION N/A 11/18/2015   Procedure: Aortic Arch Angiography;  Surgeon: Conrad Yal[place], MD;  Location: [facility];  Service: Cardiovascular;  Laterality: N/A;  . TEE WITHOUT CARDIOVERSION N/A 05/05/2016   Procedure: TRANSESOPHAGEAL ECHOCARDIOGRAM (TEE);  Surgeon: Lelon Perla, MD;  Location: [facility] END[facility];  Service: Cardiovascular;  Laterality: N/A;   Family History  Problem Relation Age of Onset  . Heart disease Mother   . Cancer Mother        breast  . Stroke Father 65  . Hypertension Father   . Early death Sister         infant death  . Heart disease Brother   . Hydrocephalus Brother   . Heart disease Brother   . Diabetes Brother   . Cancer Brother        prostat  . Healthy Son   . Healthy Son    Social History   Socioeconomic History  . Marital status: Married    Spouse name: Not on file  . Number of children: 2  . Years of education: 35  . Highest education level: 11th grade  Occupational History  . Occupation: maintenance    Employer: [employer]    Comment: Retired  Scientific laboratory technician  . Financial resource strain: Not hard at all  . Food insecurity    Worry: Never true    Inability: Never true  . Transportation needs    Medical: No    Non-medical:  No  Tobacco Use  . Smoking status: Former Smoker    Packs/day: 2.00    Years: 20.00    Pack years: 40.00    Types: Cigarettes    Quit date: 07/20/1986    Years since quitting: 32.8  . Smokeless tobacco: Never Used  Substance and Sexual Activity  . Alcohol use: No    Alcohol/week: 0.0 standard drinks  . Drug use: No  . Sexual activity: Yes  Lifestyle  . Physical activity    Days per week: 3 days    Minutes per session: 30 min  . Stress: Only a little  Relationships  . Social connections    Talks on phone: More than three times a week    Gets together: More than three times a week    Attends religious service: Never    Active member of club or organization: Yes    Attends meetings of clubs or organizations: More than 4 times per year    Relationship status: Married  Other Topics Concern  . Not on file  Social History Narrative  . Not on file    Activities of Daily Living In your present state of health, do you have any difficulty performing the following activities: 05/24/2019  Hearing? Y  Comment at times  Vision? Y  Comment Blind in left eye and wears glasses  Difficulty concentrating or making decisions? Y  Comment at times  Walking or climbing stairs? Y  Comment Due to balance  Dressing or bathing? N  Doing errands,  shopping? N  Preparing Food and eating ? N  Using the Toilet? N  In the past six months, have you accidently leaked urine? N  Do you have problems with loss of bowel control? N  Managing your Medications? N  Managing your Finances? N  Housekeeping or managing your Housekeeping? N  Some recent data might be hidden    Patient Education/ Literacy    Exercise Current Exercise Habits: The patient does not participate in regular exercise at present, Exercise limited by: orthopedic condition(s)  Diet Patient reports consuming 3 meals a day and 1 snack(s) a day Patient reports that his primary diet is: Regular and Diabetic Patient reports that she does have regular access to food.   Depression Screen PHQ 2/9 Scores 05/24/2019 05/23/2019 04/03/2019 06/30/2018 02/28/2018 02/18/2018 11/25/2017  PHQ - 2 Score 0 0 0 0 0 0 0  PHQ- 9 Score - 0 - 0 - - -     Fall Risk Fall Risk  05/24/2019 05/23/2019 04/03/2019 06/30/2018 02/28/2018  Falls in the past year? 1 0 1 0 No  Number falls in past yr: 0 - 0 - -  Injury with Fall? 0 - 0 - -  Risk for fall due to : Impaired balance/gait;Impaired vision - - - -  Follow up Falls prevention discussed - - - -     Objective:  Evan Stanley seemed alert and oriented and he participated appropriately during our telephone visit.  Blood Pressure Weight BMI  BP Readings from Last 3 Encounters:  05/23/19 122/71  05/15/19 121/63  04/03/19 (!) 97/48   Wt Readings from Last 3 Encounters:  05/23/19 177 lb (80.3 kg)  05/14/19 166 lb 0.1 oz (75.3 kg)  04/03/19 181 lb (82.1 kg)   BMI Readings from Last 1 Encounters:  05/23/19 29.45 kg/m    *Unable to obtain current vital signs, weight, and BMI due to telephone visit type  Hearing/Vision  . Evan Stanley did not seem  to have difficulty with hearing/understanding during the telephone conversation . Reports that he has had a formal eye exam by an eye care professional within the past year . Reports that he has not had a  formal hearing evaluation within the past year *Unable to fully assess hearing and vision during telephone visit type  Cognitive Function: 6CIT Screen 05/24/2019  What time? 0 points  Count back from 20 0 points  Months in reverse 0 points  Repeat phrase 0 points   (Normal:0-7, Significant for Dysfunction: >8)  Normal Cognitive Function Screening: Yes   Immunization & Health Maintenance Record Immunization History  Administered Date(s) Administered  . Fluad Quad(high Dose 65+) 05/23/2019  . Influenza Whole 05/26/2010  . Influenza, High Dose Seasonal PF 05/20/2017, 05/23/2018  . Influenza,inj,Quad PF,6+ Mos 06/14/2013, 04/16/2014, 06/18/2015  . Influenza-Unspecified 05/14/2016  . Pneumococcal Conjugate-13 06/18/2015  . Pneumococcal Polysaccharide-23 12/16/2016  . Tdap 03/05/2011    Health Maintenance  Topic Date Due  . OPHTHALMOLOGY EXAM  07/12/2019  . HEMOGLOBIN A1C  11/12/2019  . FOOT EXAM  04/02/2020  . TETANUS/TDAP  03/04/2021  . INFLUENZA VACCINE  Completed  . PNA vac Low Risk Adult  Completed       Assessment  This is a routine wellness examination for Evan Stanley.  Health Maintenance: Due or Overdue There are no preventive care reminders to display for this patient.  Evan Stanley does not need a referral for Community Assistance: Care Management:   no Social Work:    no Prescription Assistance:  no Nutrition/Diabetes Education:  no   Plan:  Personalized Goals Goals Addressed            This Visit's Progress   . AWV       05/24/2019 AWV Goal: Exercise for General Health   Patient will verbalize understanding of the benefits of increased physical activity:  Exercising regularly is important. It will improve your overall fitness, flexibility, and endurance.  Regular exercise also will improve your overall health. It can help you control your weight, reduce stress, and improve your bone density.  Over the next year, patient will increase  physical activity as tolerated with a goal of at least 150 minutes of moderate physical activity per week.   You can tell that you are exercising at a moderate intensity if your heart starts beating faster and you start breathing faster but can still hold a conversation.  Moderate-intensity exercise ideas include:  Walking 1 mile (1.6 km) in about 15 minutes  Biking  Hiking  Golfing  Dancing  Water aerobics  Patient will verbalize understanding of everyday activities that increase physical activity by providing examples like the following: ? Yard work, such as: ? Pushing a Conservation officer, nature ? Raking and bagging leaves ? Washing your car ? Pushing a stroller ? Shoveling snow ? Gardening ? Washing windows or floors  Patient will be able to explain general safety guidelines for exercising:   Before you start a new exercise program, talk with your health care provider.  Do not exercise so much that you hurt yourself, feel dizzy, or get very short of breath.  Wear comfortable clothes and wear shoes with good support.  Drink plenty of water while you exercise to prevent dehydration or heat stroke.  Work out until your breathing and your heartbeat get faster.  05/24/2019 AWV Goal: Fall Prevention  . Over the next year, patient will decrease their risk for falls by: o Using assistive devices, such as a  cane or walker, as needed o Identifying fall risks within their home and correcting them by: - Removing throw rugs - Adding handrails to stairs or ramps - Removing clutter and keeping a clear pathway throughout the home - Increasing light, especially at night - Adding shower handles/bars - Raising toilet seat o Identifying potential personal risk factors for falls: - Medication side effects - Incontinence/urgency - Vestibular dysfunction - Hearing loss - Musculoskeletal disorders - Neurological disorders - Orthostatic hypotension        Personalized Health Maintenance &  Screening Recommendations  Patient is up to date on health maintenance  Lung Cancer Screening Recommended: no (Low Dose CT Chest recommended if Age 83-80 years, 30 pack-year currently smoking OR have quit w/in past 15 years) Hepatitis C Screening recommended: no HIV Screening recommended: no  Advanced Directives: Written information was not prepared per patient's request.  Referrals & Orders No orders of the defined types were placed in this encounter.   Follow-up Plan . Follow-up with [name], DO as planned . Keep all appointments with specialist . Call the office with any questions or concerns    I have personally reviewed and noted the following in the patient's chart:   . Medical and social history . Use of alcohol, tobacco or illicit drugs  . Current medications and supplements . Functional ability and status . Nutritional status . Physical activity . Advanced directives . List of other physicians . Hospitalizations, surgeries, and ER visits in previous 12 months . Vitals . Screenings to include cognitive, depression, and falls . Referrals and appointments  In addition, I have reviewed and discussed with Evan Stanley certain preventive protocols, quality metrics, and best practice recommendations. A written personalized care plan for preventive services as well as general preventive health recommendations is available and can be mailed to the patient at his request.      Gareth Morgan LPN  [id number]

## 2019-05-26 ENCOUNTER — Telehealth: Payer: Self-pay

## 2019-05-26 NOTE — Telephone Encounter (Signed)
Received shipment from [facility] containing 4 boxes of Trulicity. Each box contains 4 pens. Called pt and left detailed voicemail informing him that this was available for pickup at his earliest convenience.

## 2019-06-04 ENCOUNTER — Other Ambulatory Visit: Payer: Self-pay | Admitting: Family Medicine

## 2019-06-20 NOTE — Telephone Encounter (Signed)
Spoke with wife let her know the PAP for [place] 3 boxes is here and ready to be picked up

## 2019-07-03 ENCOUNTER — Ambulatory Visit: Payer: Medicare Other | Admitting: Endocrinology

## 2019-07-03 ENCOUNTER — Other Ambulatory Visit: Payer: Self-pay

## 2019-07-03 ENCOUNTER — Encounter: Payer: Self-pay | Admitting: Endocrinology

## 2019-07-03 ENCOUNTER — Telehealth: Payer: Self-pay | Admitting: Endocrinology

## 2019-07-03 ENCOUNTER — Ambulatory Visit (INDEPENDENT_AMBULATORY_CARE_PROVIDER_SITE_OTHER): Payer: Medicare Other | Admitting: Endocrinology

## 2019-07-03 VITALS — BP 116/60 | HR 71 | Ht 65.0 in | Wt 173.8 lb

## 2019-07-03 DIAGNOSIS — Z794 Long term (current) use of insulin: Secondary | ICD-10-CM | POA: Diagnosis not present

## 2019-07-03 DIAGNOSIS — E1165 Type 2 diabetes mellitus with hyperglycemia: Secondary | ICD-10-CM | POA: Diagnosis not present

## 2019-07-03 NOTE — Progress Notes (Signed)
Patient ID: Evan Stanley, male   DOB: 24-Jul-1940, 78 y.o.   MRN: [id number]          Reason for Appointment: Follow-up for Type 2 Diabetes    History of Present Illness:          Date of diagnosis of type 2 diabetes mellitus: 2010       Background history:   He thinks he was diagnosed to have diabetes after his coronary bypass surgery and blood sugars are not very high He had been on metformin until about 05/2017 Also at some point had taken glimepiride Usually appears to have had A1c just above 7%  Recent history:   INSULIN regimen is: [place]  76  units daily, Humalog 4-5 with breakfast and usually 10units at dinner     Non-insulin hypoglycemic drugs the patient is taking are: Trulicity 1.5 mg weekly  His A1c has been recently below 7, last 6.6 done in 10/20   Current management, blood sugar patterns and problems identified:  He is fairly good with checking her blood sugars fasting and 2 hours after dinner  recently his blood sugars are much lower than before  this is despite reducing his [place] and Humalog by 4 units each in the last visit  he has apparently been eating less at times but does not appear to have lost a significant amount of weight overall  however even with eating a large meal with carbohydrates last evening his postprandial reading was only 74  FASTING readings have been usually below 100 the lowest reading 66 today  he has generally not been active for various reasons, previously playing golf regularly        Side effects from medications have been: None  Glucose monitoring:  done about 2 times a day         Glucometer: One Touch.       Blood Glucose readings by review of home monitor download show   PRE-MEAL Fasting Lunch Dinner Bedtime Overall  Glucose range:  66-116  63     Mean/median:  85     89   POST-MEAL PC Breakfast PC Lunch PC Dinner  Glucose range:    66-144  Mean/median:    99    PREVIOUS readings  PRE-MEAL Fasting Lunch  Dinner Bedtime Overall  Glucose range:  85-121      Mean/median:  98     100   POST-MEAL PC Breakfast PC Lunch PC Dinner  Glucose range: ?    66-154  Mean/median:    100     Self-care: The diet that the patient has been following is: tries to limit sweets.     Meal times are:  Breakfast is at after 9 AM  Typical meal intake: Breakfast is cheerios at times, otherwise eggs            Dietician visit, most recent: 2019                Weight history:  Wt Readings from Last 3 Encounters:  07/03/19 173 lb 12.8 oz (78.8 kg)  05/23/19 177 lb (80.3 kg)  05/14/19 166 lb 0.1 oz (75.3 kg)    Glycemic control:   Lab Results  Component Value Date   HGBA1C 6.6 (H) 05/14/2019   HGBA1C 6.2 04/03/2019   HGBA1C 7.1 (A) 01/03/2019   Lab Results  Component Value Date   MICROALBUR neg 11/28/2014   LDLCALC 56 05/14/2019   CREATININE 2.00 (H) 05/13/2019   Lab  Results  Component Value Date   MICRALBCREAT 47.2 (H) 11/25/2017    Lab Results  Component Value Date   FRUCTOSAMINE 323 (H) 04/22/2018      Allergies as of 07/03/2019   No Known Allergies     Medication List       Accurate as of July 03, 2019 10:52 AM. If you have any questions, ask your nurse or doctor.        amLODipine 10 MG tablet Commonly known as: NORVASC Take 10 mg by mouth daily. What changed: Another medication with the same name was removed. Continue taking this medication, and follow the directions you see here. Changed by: [name], MD   aspirin EC 81 MG tablet Take 1 tablet (81 mg total) by mouth daily.   atorvastatin 40 MG tablet Commonly known as: LIPITOR Take 1 tablet (40 mg total) by mouth daily. What changed: when to take this   cetirizine 10 MG tablet Commonly known as: ZYRTEC Take 10 mg by mouth daily.   HumaLOG KwikPen 100 UNIT/ML KwikPen Generic drug: insulin lispro Inject 5-10 Units into the skin See admin instructions. 5 Units at breakfast if over 100, hold if under 100.  Give 10 units at daily at supper   hydrochlorothiazide 25 MG tablet Commonly known as: HYDRODIURIL Take 1 tablet (25 mg total) by mouth daily.   latanoprost 0.005 % ophthalmic solution Commonly known as: XALATAN Place 1 drop into the left eye at bedtime.   lisinopril 40 MG tablet Commonly known as: ZESTRIL Take 1 tablet (40 mg total) by mouth daily.   metoprolol succinate 100 MG 24 hr tablet Commonly known as: TOPROL-XL TAKE 1 TABLET BY MOUTH  DAILY WITH OR IMMEDIATELY  FOLLOWING A MEAL. What changed:   how much to take  how to take this  when to take this  additional instructions   OneTouch Ultra test strip Generic drug: glucose blood USE TO CHECK BLOOD SUGAR  TWICE DAILY   Tresiba FlexTouch 200 UNIT/ML Sopn Generic drug: Insulin Degludec Inject 76 Units into the skin daily. Inject 76 units under the skin once daily. What changed: Another medication with the same name was removed. Continue taking this medication, and follow the directions you see here. Changed by: [name], MD   Trulicity 1.5 CB/7.6EG Sopn Generic drug: Dulaglutide Inject 1.5 mg into the skin every Saturday.   vitamin C 500 MG tablet Commonly known as: ASCORBIC ACID Take 500 mg by mouth daily.   vitamin E 1000 UNIT capsule Generic drug: vitamin E Take 1,000 Units by mouth daily.       Allergies: No Known Allergies  Past Medical History:  Diagnosis Date  . AKI (acute kidney injury) ([place])   . Allergy   . Anxiety   . Arthritis    Right shoulder  . Atrial fibrillation ([place])   . Benign essential HTN   . Carotid artery stenosis   . Cataract   . CHF (congestive heart failure) ([place])   . CVA (cerebral vascular accident) ([place]) 04/28/2016  . Decreased vision    left eye  . Diabetes mellitus without complication ([place])    Takes Metformin  . Hyperlipidemia   . Hypertension   . Salmonella bacteremia 04/29/2016  . Sepsis ([place]) 04/29/2016  . Stroke ([place]) 11/18/2015   no deficits  . TIA  (transient ischemic attack)    affected left eye  . Urgency of urination     Past Surgical History:  Procedure Laterality Date  . AORTIC  VALVE REPLACEMENT    . BACK SURGERY  80s  . CARDIAC VALVE REPLACEMENT  2010  . ENDARTERECTOMY Right 01/13/2016   Procedure: ENDARTERECTOMY CAROTID - RIGHT;  Surgeon: Conrad [place], MD;  Location: [facility];  Service: Vascular;  Laterality: Right;  . EYE SURGERY    . laser surgery, left eye  Left 10-29-2015    retinal surgery/ Deloria Lair MD   . PERIPHERAL VASCULAR CATHETERIZATION Right 11/18/2015   Procedure: Carotid Angiography;  Surgeon: Conrad [place], MD;  Location: [facility] CV LAB;  Service: Cardiovascular;  Laterality: Right;  . PERIPHERAL VASCULAR CATHETERIZATION N/A 11/18/2015   Procedure: Aortic Arch Angiography;  Surgeon: Conrad [place], MD;  Location: [facility] CV LAB;  Service: Cardiovascular;  Laterality: N/A;  . TEE WITHOUT CARDIOVERSION N/A 05/05/2016   Procedure: TRANSESOPHAGEAL ECHOCARDIOGRAM (TEE);  Surgeon: Lelon Perla, MD;  Location: [facility] ENDOSCOPY;  Service: Cardiovascular;  Laterality: N/A;    Family History  Problem Relation Age of Onset  . Heart disease Mother   . Cancer Mother        breast  . Stroke Father 52  . Hypertension Father   . Early death Sister        infant death  . Heart disease Brother   . Hydrocephalus Brother   . Heart disease Brother   . Diabetes Brother   . Cancer Brother        prostat  . Healthy Son   . Healthy Son     Social History:  reports that he quit smoking about 32 years ago. His smoking use included cigarettes. He has a 40.00 pack-year smoking history. He has never used smokeless tobacco. He reports that he does not drink alcohol or use drugs.   Review of Systems   Lipid history: Currently on atorvastatin 40 mg daily prescribed by his PCP   Has history of CAD and CVD    Lab Results  Component Value Date   CHOL 103 05/14/2019   HDL 27 (L) 05/14/2019   LDLCALC 56 05/14/2019    LDLDIRECT 62 02/16/2017   TRIG 100 05/14/2019   CHOLHDL 3.8 05/14/2019           Hypertension:  On multiple drugs including hydrochlorothiazide 25 mg and lisinopril 40 mg daily, Norvasc 10 mg daily prescribed by PCP he has checked at home at times also blood pressure still lower than before he has occasional feelings of dizziness  BP Readings from Last 3 Encounters:  07/03/19 116/60  05/23/19 122/71  05/15/19 121/63    He has had nephrology consultation only once  Lab Results  Component Value Date   CREATININE 2.00 (H) 05/13/2019   CREATININE 1.98 (H) 05/13/2019   CREATININE 1.88 (H) 04/03/2019     Most recent foot exam: 7/19    Physical Examination:  BP 116/60 (BP Location: Left Arm, Patient Position: Sitting, Cuff Size: Normal)   Pulse 71   Ht 5' 5"  (1.651 m)   Wt 173 lb 12.8 oz (78.8 kg)   SpO2 98%   BMI 28.92 kg/m       ASSESSMENT:  Diabetes type 2, on insulin  See history of present illness for detailed discussion of current diabetes management, blood sugar patterns and problems identified  His A1c was last 6.6  However his blood sugars at least in the last 2 weeks appear to be significantly lower than usual his appetite and intake is variable but even sometimes with higher carbohydrate meals at night his sugars  are at times below 100 also has had mild asymptomatic hypoglycemia with readings in the 60s, more in the evenings and occasionally midday today fasting reading was 66  Not clear why he is requiring less insulin, may be related to his renal function   HYPERTENSION: Blood pressure is relatively low again and he has not had any changes with his medications lately  CKD: Currently not followed by nephrologist  Periodic dizziness: He will follow up with his PCP  PLAN:    Reduce Tresiba to 66 units and may need further adjustment  discussed blood sugar targets in the morning and after meals  Humalog will be only 6 units at dinnertime and  can adjust based on his meal size  to call if he has further changes in his blood sugar patterns  we will see him back in 2 months for A1c  Reduce AMLODIPINE to half tablet  There are no Patient Instructions on file for this visit.     [name] 07/03/2019, 10:52 AM   Note: This office note was prepared with Dragon voice recognition system technology. Any transcriptional errors that result from this process are unintentional.

## 2019-07-03 NOTE — Telephone Encounter (Signed)
Called pt's wife and she stated that she wanted to clarify that she was to leave am humalog the same. According to chart note from appt, pt was not instructed to change am dose. Wife was told this and she verbalized understanding.

## 2019-07-03 NOTE — Patient Instructions (Addendum)
66 Tresiba and 6 Humalog units at supper  Take 1/2 Amlodipine  Check blood sugars on waking up 5-6 days a week  Also check blood sugars about 2 hours after meals and do this after different meals by rotation  Recommended blood sugar levels on waking up are 80-120 and about 2 hours after meal is 130-160  Please bring your blood sugar monitor to each visit, thank you

## 2019-07-03 NOTE — Telephone Encounter (Signed)
That is correct

## 2019-07-03 NOTE — Telephone Encounter (Signed)
Patients wife call to ask about insulin dosing going forward from today.  AM - Midday - PM, etc. I checked notes,etc and could not advise

## 2019-07-23 ENCOUNTER — Other Ambulatory Visit: Payer: Self-pay | Admitting: Family Medicine

## 2019-07-23 DIAGNOSIS — E1122 Type 2 diabetes mellitus with diabetic chronic kidney disease: Secondary | ICD-10-CM

## 2019-07-23 DIAGNOSIS — E1159 Type 2 diabetes mellitus with other circulatory complications: Secondary | ICD-10-CM

## 2019-07-23 DIAGNOSIS — I152 Hypertension secondary to endocrine disorders: Secondary | ICD-10-CM

## 2019-07-23 DIAGNOSIS — E1169 Type 2 diabetes mellitus with other specified complication: Secondary | ICD-10-CM

## 2019-07-23 DIAGNOSIS — E785 Hyperlipidemia, unspecified: Secondary | ICD-10-CM

## 2019-07-23 DIAGNOSIS — N183 Chronic kidney disease, stage 3 unspecified: Secondary | ICD-10-CM

## 2019-08-18 ENCOUNTER — Other Ambulatory Visit: Payer: Self-pay | Admitting: Pharmacy Technician

## 2019-08-18 NOTE — Patient Outreach (Signed)
Evan Stanley) Care Management  08/18/2019  Evan Stanley June 01, 1941 [id number]   Incoming call from Evan Stanley requesting assistance with re-applying for Evan Stanley, Evan Stanley. Evan Stanley confirms that patient is still on [employer] and Evan Stanley is prescriber of medications. Informed her that I would have Evan Stanley review Evan Stanley medications.  Will mail out [employer] and [employer] application to patient in the next 2 business days and will fax provider portions to Evan Stanley.  Will route note to [place] for medication review.  Evan Stanley Evan Stanley [place] Certified Pharmacy Technician [place] Management Direct Dial:[phone]

## 2019-08-21 ENCOUNTER — Other Ambulatory Visit: Payer: Self-pay | Admitting: Pharmacist

## 2019-08-21 NOTE — Patient Outreach (Signed)
[facility]) Care Management  [name]   08/21/2019  Evan Stanley 04-25-1941 [id number]   [facility] pharmacy assisted patient in 2020 with applying for patient assistance programs for Tresiba, Humalog, and Trulicity.  Patient contacted [facility] for help with renewal process for 2021.    Per review of o/v with endocrinoogy 07/03/2019, insulin doses for [place] and Humalog reduced as patient's blood sugars significantly lower than usual, [name] now 66 units / day and Humalog 6 units at supper.  Trulicity remains at 7.6JH / week.  Next appointment with endocrinology is in 2 weeks on 09/06/2019.  All of patient's other medications are Tier 1 or OTC per review of medication list.   2021 [name] formulary does include both Tresiba and Humalog on their new insulin savings model which makes co-pays for these insulins $35 / month or less even throughout the coverage gap.     Patient assistance programs through [place] will provide medication at no charge through the rest of the year.   Plan: . I will route patient assistance letter to [name] technician who will coordinate patient assistance program application process for medications listed above.  [facility] pharmacy technician will assist with obtaining all required documents from both patient and provider(s) and submit application(s) once completed.    [name], PharmD, [place] [phone]

## 2019-08-23 ENCOUNTER — Other Ambulatory Visit: Payer: Self-pay | Admitting: Pharmacy Technician

## 2019-08-23 NOTE — Patient Outreach (Signed)
[facility]) Care Management  08/23/2019  Keanthony Poole Dec 20, 1940 [id number]                                       Medication Assistance Referral  Referral From: [place]  Medication/Company: Humalog and Trulicity / [employer] [place] Patient application portion:  Education officer, museum portion: Faxed  to Dr. Eduard Clos Provider address/fax verified via: Office website  Medication/Company: [name] / [employer] Patient application portion:  Education officer, museum portion: Faxed  to Dr. Eduard Clos Provider address/fax verified via: Office website  Follow up:  Will follow up with patient in 10-14 business days to confirm application(s) have been received.  [name] [place] Certified Pharmacy Technician [place] Management Direct [phone]

## 2019-08-30 ENCOUNTER — Telehealth: Payer: Self-pay | Admitting: Family Medicine

## 2019-08-30 NOTE — Chronic Care Management (AMB) (Signed)
  Chronic Care Management   Note  08/30/2019 Name: Evan Stanley MRN: [id number] DOB: 05-20-1941  [name] is a 79 y.o. year old male who is a primary care patient of [name], DO. I reached out to Lawerance Cruel by phone today Spoke with Wife [name] in response to a referral sent by Mr. Chritopher Coster [facility] health plan.     Mrs. Laskowski was given information about Chronic Care Management services today including:  1. CCM service includes personalized support from designated clinical staff supervised by his physician, including individualized plan of care and coordination with other care providers 2. 24/7 contact phone numbers for assistance for urgent and routine care needs. 3. Service will only be billed when office clinical staff spend 20 minutes or more in a month to coordinate care. 4. Only one practitioner may furnish and bill the service in a calendar month. 5. The patient may stop CCM services at any time (effective at the end of the month) by phone call to the office staff. 6. The patient will be responsible for cost sharing (co-pay) of up to 20% of the service fee (after annual deductible is met).  Patient wife [name] did not agree to enrollment in care management services and does not wish to consider at this time.  Follow up plan: The patient has been provided with contact information for the care management team and has been advised to call with any health related questions or concerns.   Noreene Larsson, [place], [place], [place] [address] Direct Dial: [phone] [email or web address] Website: [email or web address]

## 2019-09-04 ENCOUNTER — Other Ambulatory Visit: Payer: Self-pay | Admitting: Pharmacy Technician

## 2019-09-04 NOTE — Patient Outreach (Signed)
[facility]) Care Management  09/04/2019  Zaquan Duffner [birth year] [id number]   Received patient portion(s) of patient assistance application(s) for [name], Trulicity and Humalog. Faxed completed application and required documents into [place].  Will follow up with company(ies) in 10-14 business days to check status of application(s).  [name] [place] Certified Pharmacy Technician [place] Management Direct Dial:[phone]

## 2019-09-06 ENCOUNTER — Ambulatory Visit (INDEPENDENT_AMBULATORY_CARE_PROVIDER_SITE_OTHER): Payer: Medicare Other | Admitting: Endocrinology

## 2019-09-06 ENCOUNTER — Encounter: Payer: Self-pay | Admitting: Endocrinology

## 2019-09-06 ENCOUNTER — Other Ambulatory Visit: Payer: Self-pay

## 2019-09-06 VITALS — BP 110/60 | HR 52 | Ht 65.0 in | Wt 169.8 lb

## 2019-09-06 DIAGNOSIS — R634 Abnormal weight loss: Secondary | ICD-10-CM

## 2019-09-06 DIAGNOSIS — R5383 Other fatigue: Secondary | ICD-10-CM | POA: Diagnosis not present

## 2019-09-06 DIAGNOSIS — Z794 Long term (current) use of insulin: Secondary | ICD-10-CM | POA: Diagnosis not present

## 2019-09-06 DIAGNOSIS — N183 Chronic kidney disease, stage 3 unspecified: Secondary | ICD-10-CM | POA: Diagnosis not present

## 2019-09-06 DIAGNOSIS — E1165 Type 2 diabetes mellitus with hyperglycemia: Secondary | ICD-10-CM

## 2019-09-06 LAB — COMPREHENSIVE METABOLIC PANEL
ALT: 22 U/L (ref 0–53)
AST: 17 U/L (ref 0–37)
Albumin: 4 g/dL (ref 3.5–5.2)
Alkaline Phosphatase: 81 U/L (ref 39–117)
BUN: 25 mg/dL — ABNORMAL HIGH (ref 6–23)
CO2: 32 mEq/L (ref 19–32)
Calcium: 9.6 mg/dL (ref 8.4–10.5)
Chloride: 103 mEq/L (ref 96–112)
Creatinine, Ser: 1.6 mg/dL — ABNORMAL HIGH (ref 0.40–1.50)
GFR: 42.17 mL/min — ABNORMAL LOW (ref >60.00)
Glucose, Bld: 128 mg/dL — ABNORMAL HIGH (ref 70–99)
Potassium: 4.3 mEq/L (ref 3.5–5.1)
Sodium: 140 mEq/L (ref 135–145)
Total Bilirubin: 0.7 mg/dL (ref 0.2–1.2)
Total Protein: 6.4 g/dL (ref 6.0–8.3)

## 2019-09-06 LAB — TSH: TSH: 1.93 u[IU]/mL (ref 0.35–4.50)

## 2019-09-06 LAB — POCT GLYCOSYLATED HEMOGLOBIN (HGB A1C): Hemoglobin A1C: 5.8 % — AB (ref 4.0–5.6)

## 2019-09-06 LAB — MICROALBUMIN / CREATININE URINE RATIO
Creatinine,U: 211.5 mg/dL
Microalb Creat Ratio: 2.4 mg/g (ref 0.0–30.0)
Microalb, Ur: 5.1 mg/dL — ABNORMAL HIGH (ref 0.0–1.9)

## 2019-09-06 LAB — T4, FREE: Free T4: 0.97 ng/dL (ref 0.60–1.60)

## 2019-09-06 NOTE — Patient Instructions (Addendum)
Tresiba 56 units and no humalog  If in 1 week  am sugars stay <90 in am  then [place] down another 6 U  to 50  Check blood sugars on waking up days a week  Also check blood sugars about 2 hours after meals and do this after different meals by rotation  Recommended blood sugar levels on waking up are 90-130 and about 2 hours after meal is 130-180  Please bring your blood sugar monitor to each visit, thank you

## 2019-09-06 NOTE — Progress Notes (Signed)
Please call to let patient know that the lab results are good, kidney test better than before

## 2019-09-06 NOTE — Progress Notes (Signed)
Patient ID: Evan Stanley, male   DOB: 12/25/1940, 79 y.o.   MRN: [id number]          Reason for Appointment: Follow-up for Type 2 Diabetes    History of Present Illness:          Date of diagnosis of type 2 diabetes mellitus: 2010       Background history:   He thinks he was diagnosed to have diabetes after his coronary bypass surgery and blood sugars are not very high He had been on metformin until about 05/2017 Also at some point had taken glimepiride Usually appears to have had A1c just above 7%  Recent history:   INSULIN regimen is: [place]  66  units daily, Humalog 4-5 with breakfast and usually 6 units at dinner     Non-insulin hypoglycemic drugs the patient is taking are: Trulicity 1.5 mg weekly  His A1c has been consistently below 7, now only 5.8  Current management, blood sugar patterns and problems identified:  He is continuing to have low fasting blood sugars despite his [name] being reduced by 10 units on the last visit in 12/20  He does think that he is eating smaller meals and does not get hungry over the last couple of months although he has been on Trulicity for quite some time  He feels a little sluggish in the mornings but not clear if only when blood sugars are lower  With 6 units of Humalog at dinnertime his blood sugars postprandially are excellent and averaging only 111  Most of his blood sugars in the mornings have been below 100, no nocturnal hypoglycemia  He is usually not changing the types of foods that he is eating and his wife is helping him plan his meals  As before he has not been able to be active  Checks blood sugars twice a day fairly consistently        Side effects from medications have been: None  Glucose monitoring:  done about 2 times a day         Glucometer: One Touch.       Blood Glucose readings by review of home monitor download show   PRE-MEAL Fasting Lunch Dinner Bedtime Overall  Glucose range: 6 2-110       Mean/median:  84     96   POST-MEAL PC Breakfast PC Lunch PC Dinner  Glucose range:    61-158  Mean/median:    109   Previous readings:  PRE-MEAL Fasting Lunch Dinner Bedtime Overall  Glucose range:  66-116  63     Mean/median:  85     89   POST-MEAL PC Breakfast PC Lunch PC Dinner  Glucose range:    66-144  Mean/median:    99     Self-care: The diet that the patient has been following is: tries to limit sweets.     Meal times are:  Breakfast is at after 9 AM  Typical meal intake: Breakfast is cheerios at times, otherwise eggs            Dietician visit, most recent: 2019                Weight history:  Wt Readings from Last 3 Encounters:  09/06/19 169 lb 12.8 oz (77 kg)  07/03/19 173 lb 12.8 oz (78.8 kg)  05/23/19 177 lb (80.3 kg)    Glycemic control:   Lab Results  Component Value Date   HGBA1C 5.8 (A) 09/06/2019  HGBA1C 6.6 (H) 05/14/2019   HGBA1C 6.2 04/03/2019   Lab Results  Component Value Date   MICROALBUR 5.1 (H) 09/06/2019   LDLCALC 56 05/14/2019   CREATININE 1.60 (H) 09/06/2019   Lab Results  Component Value Date   MICRALBCREAT 2.4 09/06/2019    Lab Results  Component Value Date   FRUCTOSAMINE 323 (H) 04/22/2018      Allergies as of 09/06/2019   No Known Allergies     Medication List       Accurate as of September 06, 2019  6:27 PM. If you have any questions, ask your nurse or doctor.        amLODipine 10 MG tablet Commonly known as: NORVASC TAKE 1 TABLET BY MOUTH  DAILY   aspirin EC 81 MG tablet Take 1 tablet (81 mg total) by mouth daily.   atorvastatin 40 MG tablet Commonly known as: LIPITOR TAKE 1 TABLET BY MOUTH  DAILY   cetirizine 10 MG tablet Commonly known as: ZYRTEC Take 10 mg by mouth daily.   HumaLOG KwikPen 100 UNIT/ML KwikPen Generic drug: insulin lispro Inject 5-10 Units into the skin See admin instructions. 5 Units at breakfast if over 100, hold if under 100. Give 6 units at daily at supper    hydrochlorothiazide 25 MG tablet Commonly known as: HYDRODIURIL TAKE 1 TABLET BY MOUTH  DAILY   latanoprost 0.005 % ophthalmic solution Commonly known as: XALATAN Place 1 drop into the left eye at bedtime.   lisinopril 40 MG tablet Commonly known as: ZESTRIL TAKE 1 TABLET BY MOUTH  DAILY   metoprolol succinate 100 MG 24 hr tablet Commonly known as: TOPROL-XL TAKE 1 TABLET BY MOUTH  DAILY WITH OR IMMEDIATELY  FOLLOWING A MEAL.   OneTouch Ultra test strip Generic drug: glucose blood USE TO CHECK BLOOD SUGAR  TWICE DAILY   Tresiba FlexTouch 200 UNIT/ML Sopn Generic drug: Insulin Degludec Inject 66 Units into the skin daily. Inject 66 units under the skin once daily.   Trulicity 1.5 YN/8.2NF Sopn Generic drug: Dulaglutide Inject 1.5 mg into the skin every Saturday.   vitamin C 500 MG tablet Commonly known as: ASCORBIC ACID Take 500 mg by mouth daily.   vitamin E 1000 UNIT capsule Generic drug: vitamin E Take 1,000 Units by mouth daily.       Allergies: No Known Allergies  Past Medical History:  Diagnosis Date  . AKI (acute kidney injury) ([place])   . Allergy   . Anxiety   . Arthritis    Right shoulder  . Atrial fibrillation ([place])   . Benign essential HTN   . Carotid artery stenosis   . Cataract   . CHF (congestive heart failure) ([place])   . CVA (cerebral vascular accident) ([place]) 04/28/2016  . Decreased vision    left eye  . Diabetes mellitus without complication ([place])    Takes Metformin  . Hyperlipidemia   . Hypertension   . Salmonella bacteremia 04/29/2016  . Sepsis ([place]) 04/29/2016  . Stroke ([place]) 11/18/2015   no deficits  . TIA (transient ischemic attack)    affected left eye  . Urgency of urination     Past Surgical History:  Procedure Laterality Date  . AORTIC VALVE REPLACEMENT    . BACK SURGERY  80s  . CARDIAC VALVE REPLACEMENT  2010  . ENDARTERECTOMY Right 01/13/2016   Procedure: ENDARTERECTOMY CAROTID - RIGHT;  Surgeon: Conrad Cedarville, MD;   Location: [facility];  Service: Vascular;  Laterality: Right;  .  EYE SURGERY    . laser surgery, left eye  Left 10-29-2015    retinal surgery/ Deloria Lair MD   . PERIPHERAL VASCULAR CATHETERIZATION Right 11/18/2015   Procedure: Carotid Angiography;  Surgeon: Conrad Poston, MD;  Location: [facility];  Service: Cardiovascular;  Laterality: Right;  . PERIPHERAL VASCULAR CATHETERIZATION N/A 11/18/2015   Procedure: Aortic Arch Angiography;  Surgeon: Conrad Gwinn, MD;  Location: [facility];  Service: Cardiovascular;  Laterality: N/A;  . TEE WITHOUT CARDIOVERSION N/A 05/05/2016   Procedure: TRANSESOPHAGEAL ECHOCARDIOGRAM (TEE);  Surgeon: Lelon Perla, MD;  Location: [facility];  Service: Cardiovascular;  Laterality: N/A;    Family History  Problem Relation Age of Onset  . Heart disease Mother   . Cancer Mother        breast  . Stroke Father 42  . Hypertension Father   . Early death Sister        infant death  . Heart disease Brother   . Hydrocephalus Brother   . Heart disease Brother   . Diabetes Brother   . Cancer Brother        prostat  . Healthy Son   . Healthy Son     Social History:  reports that he quit smoking about 33 years ago. His smoking use included cigarettes. He has a 40.00 pack-year smoking history. He has never used smokeless tobacco. He reports that he does not drink alcohol or use drugs.   Review of Systems   Lipid history: Currently on atorvastatin 40 mg daily prescribed by his PCP   Has history of CAD and CVD    Lab Results  Component Value Date   CHOL 103 05/14/2019   HDL 27 (L) 05/14/2019   LDLCALC 56 05/14/2019   LDLDIRECT 62 02/16/2017   TRIG 100 05/14/2019   CHOLHDL 3.8 05/14/2019           Hypertension:  On multiple drugs including hydrochlorothiazide 25 mg and lisinopril 40 mg daily, Norvasc 5 mg daily prescribed by PCP he has checked at home at times also Blood pressure low normal, amlodipine was reduced to half tablet on last  visit  BP Readings from Last 3 Encounters:  09/06/19 110/60  07/03/19 116/60  05/23/19 122/71    He has had not had nephrology follow-up  Lab Results  Component Value Date   CREATININE 1.60 (H) 09/06/2019   CREATININE 2.00 (H) 05/13/2019   CREATININE 1.98 (H) 05/13/2019     Most recent foot exam: 9/20    Physical Examination:  BP 110/60 (BP Location: Left Arm, Patient Position: Sitting, Cuff Size: Normal)   Pulse (!) 52   Ht 5' 5"  (1.651 m)   Wt 169 lb 12.8 oz (77 kg)   SpO2 96%   BMI 28.26 kg/m       ASSESSMENT:  Diabetes type 2, on insulin  See history of present illness for detailed discussion of current diabetes management, blood sugar patterns and problems identified  His A1c was last 6.6 and now 5.8  Despite reducing his basal insulin his blood sugars are still low normal fasting Also after evening meal even with 6 units of insulin his blood sugars are averaging only about 110 postprandially He is now reporting more consistent decrease in appetite and some satiety of unclear etiology Previously had tolerated Trulicity well without side effect Has had 4 pound weight loss Otherwise meals are relatively balanced  Not clear why he is requiring less insulin although renal  function has been gradually deteriorating in 2020 No evaluation for thyroid function available   HYPERTENSION: Blood pressure is relatively low again but asymptomatic Consider reducing medications especially if renal function is getting worse  CKD: not followed by nephrologist  PLAN:    Reduce Tresiba to 56 units and given instruction regarding further reduction in the dose by 6 units every week if morning sugars stay below 90, his wife will be helping with this adjustment  Sample of Trulicity 6.06 mg given to try for 2 weeks and if this helps his appetite back to normal he will request a new prescription for this  He will report any continuing hypoglycemia  We will stop his HUMALOG  for now and only needed if he has consistently high readings over 180 after evening meal  He can alternate morning and evening blood sugar checks  Need further adjustment of blood pressure medications if blood pressure stays low normal and he will monitor regularly at home and report to his PCP  Also he will discuss his decreased appetite/satiety with PCP if persistent  Check renal function today is also microalbumin  Patient Instructions  Tresiba 56 units and no humalog  If in 1 week  am sugars stay <90 in am  then [place] down another 6 U  to 50  Check blood sugars on waking up days a week  Also check blood sugars about 2 hours after meals and do this after different meals by rotation  Recommended blood sugar levels on waking up are 90-130 and about 2 hours after meal is 130-180  Please bring your blood sugar monitor to each visit, thank you        [name] 09/06/2019, 6:27 PM   Note: This office note was prepared with Dragon voice recognition system technology. Any transcriptional errors that result from this process are unintentional.  Addendum: Labs unremarkable

## 2019-09-11 ENCOUNTER — Other Ambulatory Visit: Payer: Self-pay | Admitting: *Deleted

## 2019-09-11 ENCOUNTER — Telehealth: Payer: Self-pay

## 2019-09-11 MED ORDER — ONETOUCH ULTRASOFT LANCETS MISC: 3 refills | Status: DC

## 2019-09-11 NOTE — Telephone Encounter (Signed)
Received fax from [employer] stating that the patient has been approved for the program until November 30,2021. A four month supply of pt's medications is being shipped to this office.

## 2019-09-14 DIAGNOSIS — E119 Type 2 diabetes mellitus without complications: Secondary | ICD-10-CM | POA: Diagnosis not present

## 2019-09-14 DIAGNOSIS — H2511 Age-related nuclear cataract, right eye: Secondary | ICD-10-CM | POA: Diagnosis not present

## 2019-09-14 DIAGNOSIS — H4089 Other specified glaucoma: Secondary | ICD-10-CM | POA: Diagnosis not present

## 2019-09-14 DIAGNOSIS — Z961 Presence of intraocular lens: Secondary | ICD-10-CM | POA: Diagnosis not present

## 2019-09-14 DIAGNOSIS — Z794 Long term (current) use of insulin: Secondary | ICD-10-CM | POA: Diagnosis not present

## 2019-09-15 ENCOUNTER — Telehealth: Payer: Self-pay | Admitting: Endocrinology

## 2019-09-15 ENCOUNTER — Other Ambulatory Visit: Payer: Self-pay

## 2019-09-15 MED ORDER — TRULICITY 0.75 MG/0.5ML ~~LOC~~ SOAJ: 0.7500 mg | SUBCUTANEOUS | 3 refills | Status: DC

## 2019-09-15 NOTE — Telephone Encounter (Signed)
Patient's wife Evan Stanley called re: [employer] needs clarification of the dosage for Trulicity. Evan Stanley states Dr. Dwyane Dee lowered the dosage of the medication listed above to .75-samples given to patient was .75 dosage (dosage prior was 1.5). Evan Stanley requests that the [facility] that [employer] uses be called for clarification of dosage of the medication listed above at ph# [phone]. Evan Stanley also requests that any RX's that are mailed to our office for patient to pick up be mailed to patient at his home-if possible. Evan Stanley was not able to ask the woman at [facility] to change where medication is mailed.

## 2019-09-15 NOTE — Telephone Encounter (Signed)
Updated Rx to reflect 0.37m dose. Rx was printed and will e signed by MD on Monday upon his return. Once signed, it will be faxed to [place]

## 2019-09-21 ENCOUNTER — Other Ambulatory Visit: Payer: Self-pay | Admitting: Pharmacy Technician

## 2019-09-21 NOTE — Patient Outreach (Signed)
[facility]) Care Management  09/21/2019  Evan Stanley Aug 06, 1940 [id number]   Follow up call placed to [employer] regarding patient assistance application(s) for Evan Stanley confirms patient has been approved as of 2/16 until 07/19/2020. Medication to arrive at the office any day now due to weather delays in shipping.  Follow up call placed to [facility] regarding patient assistance application(s) for Evan Stanley and Evan Stanley , Evan Stanley confirms patient has been approved as of 2/25 until 07/19/2020. Evan Stanley to be delivered to patients home on 3/9 and Evan Stanley order is still being processed.  Follow up:  Will follow up with [employer] in 3-5 business days to check shipping status of Evan Stanley.  Evan Stanley Evan Stanley [place] Certified Pharmacy Technician Evan Stanley Management Direct Dial:[phone]

## 2019-09-25 ENCOUNTER — Telehealth: Payer: Self-pay | Admitting: Endocrinology

## 2019-09-25 NOTE — Telephone Encounter (Signed)
Called [facility] to clarify this. The fax number that the Rx was previously sent to was for [facility], and not [facility]. Legally, [facility] cannot forward the Rx since they are not a pharmacy.  Provided pharmacist with a verbal order for the new dose and this is being filled and expedited as much as possible.

## 2019-09-25 NOTE — Telephone Encounter (Signed)
[facility] is calling stating they did not have a prescription for the trulicity 0.91ZC and is calling to check on that.  [facility] ph# [phone]

## 2019-09-25 NOTE — Telephone Encounter (Signed)
Called pt's wife and informed her that at pt's last visit Dr. Dwyane Dee decreased dose of Trulicity from 6.8GS weekly to 0.50m weekly and he was sent home with two weeks worth of samples of this new dose. Pt's wife agreed that his had occurred, and she was then informed that a new Rx was sent to [facility] pharmacy since the pt is currently getting his medications from the [employer] assistance [employer]. Pt's wife was given the phone number for [facility] to call and inquire about the status of pt's order.

## 2019-09-25 NOTE — Telephone Encounter (Signed)
Patient's wife calling to verify which dosage patient needs to be taking for Trulicity. Ph# [phone]

## 2019-09-26 ENCOUNTER — Telehealth: Payer: Self-pay

## 2019-09-26 NOTE — Telephone Encounter (Signed)
Received shipment of patient assistance medication from [facility]. Inside shipment was 2 boxes of Humalog, each box containing 5 insulin pens. Also included was 4 boxes of Trulicity 5.46FK injections. Each box contains 4 injection pens. Called pt and left detailed voicemail that this medication was available for pick up.

## 2019-09-28 ENCOUNTER — Telehealth: Payer: Self-pay

## 2019-09-28 DIAGNOSIS — E1151 Type 2 diabetes mellitus with diabetic peripheral angiopathy without gangrene: Secondary | ICD-10-CM | POA: Diagnosis not present

## 2019-09-28 DIAGNOSIS — L84 Corns and callosities: Secondary | ICD-10-CM | POA: Diagnosis not present

## 2019-09-28 DIAGNOSIS — B351 Tinea unguium: Secondary | ICD-10-CM | POA: Diagnosis not present

## 2019-09-28 DIAGNOSIS — M79676 Pain in unspecified toe(s): Secondary | ICD-10-CM | POA: Diagnosis not present

## 2019-09-28 NOTE — Telephone Encounter (Signed)
Received shipment from [employer] patient assistance program for pt. Inside shipment was 6 boxes of Tresiba  U200 insulin. Inside each box is 3 insulin pens. Called pt and left detailed voicemail informing him of this.

## 2019-09-29 ENCOUNTER — Other Ambulatory Visit: Payer: Self-pay

## 2019-10-01 NOTE — Progress Notes (Signed)
Subjective: CC: DM2, HTN, HLD, CKD3 PCP: [name], DO HPI:Evan Stanley is a 79 y.o. male presenting to clinic today for:  1. Type 2 Diabetes w/ HTN, HLD, CKD3:  Patient reports compliance with Norvasc 5 mg, Lisinopril 40, HCTZ 42m, Metoprol XL 1097m Lipitor 4090m He is seen by Dr. KumDwyane Deeth endocrinology. He is taking 50u of tresiba BID.  Has not needed humalog because BGs are <100 most days.  He does note some intermittent dizziness that seems to be occurring on a daily basis.  Dizziness does seem to be positional.  He has not really been checking the blood pressures himself.  His wife typically does this for him and has his log at home.  He saw Dr KumDwyane Dee February.  Good check up w/ excellent control of sugars.   Last eye exam: UTD Last foot exam: UTD Last A1c:  Lab Results  Component Value Date   HGBA1C 5.8 (A) 09/06/2019   Nephropathy screen indicated?: on ACE-I Last flu, zoster and/or pneumovax: UTD Immunization History  Administered Date(s) Administered  . Fluad Quad(high Dose 65+) 05/23/2019  . Influenza Whole 05/26/2010  . Influenza, High Dose Seasonal PF 05/20/2017, 05/23/2018  . Influenza,inj,Quad PF,6+ Mos 06/14/2013, 04/16/2014, 06/18/2015  . Influenza-Unspecified 05/14/2016  . Pneumococcal Conjugate-13 06/18/2015  . Pneumococcal Polysaccharide-23 12/16/2016  . Tdap 03/05/2011    ROS: Denies LOC, polyuria, polydipsia, unintended weight loss/gain, foot ulcerations, numbness or tingling in extremities, shortness of breath or chest pain.  He had a near fall off his lawnmower recently after standing.  2.  Decreased hearing Patient reports decreased hearing and ear fullness on the right.  He has been using some over-the-counter drops in efforts to relieve the wax in the ear but this is not helping.  Denies any pain in the ear.  No fevers.  He has had dizziness as above.   No Known Allergies Past Medical History:  Diagnosis Date  . AKI (acute  kidney injury) ([facility] . Allergy   . Anxiety   . Arthritis    Right shoulder  . Atrial fibrillation ([facility] . Benign essential HTN   . Carotid artery stenosis   . Cataract   . CHF (congestive heart failure) ([facility] . CVA (cerebral vascular accident) ([facility] [place]0/04/2016  . Decreased vision    left eye  . Diabetes mellitus without complication ([facility] [place]  Takes Metformin  . Hyperlipidemia   . Hypertension   . Salmonella bacteremia 04/29/2016  . Sepsis ([facility]/05/2016  . Stroke ([facility]/07/2015   no deficits  . TIA (transient ischemic attack)    affected left eye  . Urgency of urination     Current Outpatient Medications:  .  amLODipine (NORVASC) 10 MG tablet, TAKE 1 TABLET BY MOUTH  DAILY, Disp: 90 tablet, Rfl: 0 .  aspirin EC 81 MG tablet, Take 1 tablet (81 mg total) by mouth daily., Disp: 90 tablet, Rfl: 3 .  atorvastatin (LIPITOR) 40 MG tablet, TAKE 1 TABLET BY MOUTH  DAILY, Disp: 90 tablet, Rfl: 0 .  cetirizine (ZYRTEC) 10 MG tablet, Take 10 mg by mouth daily., Disp: , Rfl:  .  Dulaglutide (TRULICITY) 0.77.34/LP/3.7TKPN, Inject 0.75 mg into the skin once a week., Disp: 6 mL, Rfl: 3 .  hydrochlorothiazide (HYDRODIURIL) 25 MG tablet, TAKE 1 TABLET BY MOUTH  DAILY, Disp: 90 tablet, Rfl: 0 .  Insulin Degludec (TRESIBA FLEXTOUCH) 200 UNIT/ML SOPN, Inject 66 Units into the  skin daily. Inject 66 units under the skin once daily., Disp: , Rfl:  .  insulin lispro (HUMALOG KWIKPEN) 100 UNIT/ML KwikPen, Inject 5-10 Units into the skin See admin instructions. 5 Units at breakfast if over 100, hold if under 100. Give 6 units at daily at supper, Disp: , Rfl:  .  Lancets (ONETOUCH ULTRASOFT) lancets, Test BS BID Dx E11.9, Disp: 200 each, Rfl: 3 .  latanoprost (XALATAN) 0.005 % ophthalmic solution, Place 1 drop into the left eye at bedtime. , Disp: , Rfl:  .  lisinopril (ZESTRIL) 40 MG tablet, TAKE 1 TABLET BY MOUTH  DAILY, Disp: 90 tablet, Rfl: 0 .  metoprolol succinate (TOPROL-XL) 100 MG 24 hr  tablet, TAKE 1 TABLET BY MOUTH  DAILY WITH OR IMMEDIATELY  FOLLOWING A MEAL., Disp: 90 tablet, Rfl: 0 .  ONETOUCH ULTRA test strip, USE TO CHECK BLOOD SUGAR  TWICE DAILY, Disp: 200 strip, Rfl: 3 .  vitamin C (ASCORBIC ACID) 500 MG tablet, Take 500 mg by mouth daily., Disp: , Rfl:  .  vitamin E (VITAMIN E) 1000 UNIT capsule, Take 1,000 Units by mouth daily., Disp: , Rfl:  Social History   Socioeconomic History  . Marital status: Married    Spouse name: Not on file  . Number of children: 2  . Years of education: 35  . Highest education level: 11th grade  Occupational History  . Occupation: maintenance    Employer: [employer]    Comment: Retired  Tobacco Use  . Smoking status: Former Smoker    Packs/day: 2.00    Years: 20.00    Pack years: 40.00    Types: Cigarettes    Quit date: 07/20/1986    Years since quitting: 33.2  . Smokeless tobacco: Never Used  Substance and Sexual Activity  . Alcohol use: No    Alcohol/week: 0.0 standard drinks  . Drug use: No  . Sexual activity: Yes  Other Topics Concern  . Not on file  Social History Narrative  . Not on file   Social Determinants of Health   Financial Resource Strain:   . Difficulty of Paying Living Expenses:   Food Insecurity:   . Worried About Charity fundraiser in the Last Year:   . Arboriculturist in the Last Year:   Transportation Needs:   . Film/video editor (Medical):   Marland Kitchen Lack of Transportation (Non-Medical):   Physical Activity:   . Days of Exercise per Week:   . Minutes of Exercise per Session:   Stress:   . Feeling of Stress :   Social Connections:   . Frequency of Communication with Friends and Family:   . Frequency of Social Gatherings with Friends and Family:   . Attends Religious Services:   . Active Member of Clubs or Organizations:   . Attends Archivist Meetings:   Marland Kitchen Marital Status:   Intimate Partner Violence:   . Fear of Current or Ex-Partner:   . Emotionally Abused:   Marland Kitchen Physically  Abused:   . Sexually Abused:    Family History  Problem Relation Age of Onset  . Heart disease Mother   . Cancer Mother        breast  . Stroke Father 21  . Hypertension Father   . Early death Sister        infant death  . Heart disease Brother   . Hydrocephalus Brother   . Heart disease Brother   . Diabetes Brother   .  Cancer Brother        prostat  . Healthy Son   . Healthy Son     Objective: Office vital signs reviewed. BP 108/66   Pulse 69   Temp 98 F (36.7 C) (Temporal)   Ht 5' 5"  (1.651 m)   Wt 166 lb 9.6 oz (75.6 kg)   SpO2 100%   BMI 27.72 kg/m   Physical Examination:  General: Awake, alert, well nourished, No acute distress HEENT: Normal, sclera white, MMM; right TM occluded by cerumen.  Mild to moderate amount of cerumen in the left ear but TM is visualized and does not show any abnormalities Cardio: regular rate and rhythm, S1S2 heard, 1/6 SEM at RSB noted Pulm: clear to auscultation bilaterally, no wheezes, rhonchi or rales; normal work of breathing on room air Extremities: warm, well perfused, No edema, cyanosis or clubbing; +2 pulses bilaterally Psych: Mood stable, speech normal, affect appropriate, pleasant and interactive  Depression screen [facility] 2/9 10/02/2019 05/24/2019 05/23/2019  Decreased Interest 0 0 0  Down, Depressed, Hopeless 0 0 0  PHQ - 2 Score 0 0 0  Altered sleeping - - 0  Tired, decreased energy - - 0  Change in appetite - - 0  Feeling bad or failure about yourself  - - 0  Trouble concentrating - - 0  Moving slowly or fidgety/restless - - 0  Suicidal thoughts - - 0  PHQ-9 Score - - 0  Difficult doing work/chores - - -     Assessment/ Plan: 79 y.o. male   1. Hypertension associated with diabetes ([place]) Borderline low.  I do question orthostasis.  We will go ahead and discontinue the amlodipine totally.  He may continue the lisinopril, Toprol and hydrochlorothiazide.  I would like him to monitor his blood pressures daily and call me  with the record on Monday.  Goal is less than 140/90 but ideal around 120s over 70s.  Hopefully this will rectify the dizziness he is having. - lisinopril (ZESTRIL) 40 MG tablet; Take 1 tablet (40 mg total) by mouth daily.  Dispense: 90 tablet; Refill: 1 - metoprolol succinate (TOPROL-XL) 100 MG 24 hr tablet; Take with or immediately following a meal.  Dispense: 90 tablet; Refill: 1  2. Type 2 diabetes mellitus with stage 3b chronic kidney disease, without long-term current use of insulin (HCC) Controlled.  Continue to follow-up with endocrinology   3. Hyperlipidemia associated w/ type 2 DM goal <70 Continue statin.  Due for fasting lipid panel in October  4. CKD stage 3 secondary to diabetes [facility]) Renal function is stable/ slightly improved.  Continue to follow with nephrology as scheduled - lisinopril (ZESTRIL) 40 MG tablet; Take 1 tablet (40 mg total) by mouth daily.  Dispense: 90 tablet; Refill: 1  5. Impacted cerumen of right ear Irrigated today.  TM without evidence of infection or obstruction  No orders of the defined types were placed in this encounter.  No orders of the defined types were placed in this encounter.    [name], DO [place] [phone]

## 2019-10-02 ENCOUNTER — Other Ambulatory Visit: Payer: Self-pay

## 2019-10-02 ENCOUNTER — Ambulatory Visit (INDEPENDENT_AMBULATORY_CARE_PROVIDER_SITE_OTHER): Payer: Medicare Other | Admitting: Family Medicine

## 2019-10-02 ENCOUNTER — Encounter: Payer: Self-pay | Admitting: Family Medicine

## 2019-10-02 VITALS — BP 108/66 | HR 69 | Temp 98.0°F | Ht 65.0 in | Wt 166.6 lb

## 2019-10-02 DIAGNOSIS — I10 Essential (primary) hypertension: Secondary | ICD-10-CM

## 2019-10-02 DIAGNOSIS — N1832 Chronic kidney disease, stage 3b: Secondary | ICD-10-CM

## 2019-10-02 DIAGNOSIS — E1121 Type 2 diabetes mellitus with diabetic nephropathy: Secondary | ICD-10-CM

## 2019-10-02 DIAGNOSIS — H6121 Impacted cerumen, right ear: Secondary | ICD-10-CM

## 2019-10-02 DIAGNOSIS — E1159 Type 2 diabetes mellitus with other circulatory complications: Secondary | ICD-10-CM

## 2019-10-02 DIAGNOSIS — E1122 Type 2 diabetes mellitus with diabetic chronic kidney disease: Secondary | ICD-10-CM

## 2019-10-02 DIAGNOSIS — E1169 Type 2 diabetes mellitus with other specified complication: Secondary | ICD-10-CM

## 2019-10-02 DIAGNOSIS — I152 Hypertension secondary to endocrine disorders: Secondary | ICD-10-CM

## 2019-10-02 DIAGNOSIS — E785 Hyperlipidemia, unspecified: Secondary | ICD-10-CM

## 2019-10-02 DIAGNOSIS — N183 Chronic kidney disease, stage 3 unspecified: Secondary | ICD-10-CM

## 2019-10-02 MED ORDER — METOPROLOL SUCCINATE ER 100 MG PO TB24: ORAL_TABLET | ORAL | 1 refills | Status: DC

## 2019-10-02 MED ORDER — HYDROCHLOROTHIAZIDE 25 MG PO TABS: 25.0000 mg | ORAL_TABLET | Freq: Every day | ORAL | 1 refills | Status: DC

## 2019-10-02 MED ORDER — LISINOPRIL 40 MG PO TABS: 40.0000 mg | ORAL_TABLET | Freq: Every day | ORAL | 1 refills | Status: DC

## 2019-10-02 NOTE — Patient Instructions (Addendum)
STOP the amlodipine.   Your blood pressure was a little on the borderline of low today.  This may be causing your daily dizziness.   I want you to check your blood pressure every day and call me on Monday with the readings.   Your goal blood pressure is around 120/70.   If it goes above 140/90, please let me know.  Your ear was cleaned out today.  When we see eachother again in 6 months, please make sure to be fasting.  We will collect your cholesterol panel then.

## 2019-10-09 ENCOUNTER — Other Ambulatory Visit: Payer: Self-pay | Admitting: Pharmacist

## 2019-10-09 NOTE — Patient Outreach (Signed)
[facility] ([facility])  [facility] Quality Pharmacy Team    [facility] pharmacy case will be closed as our team is transitioning from the [facility] Care Management Department into the [facility] Quality Department and will no longer be using [facility] for documentation purposes.   [facility] pharmacy technician will continue to assist patient with medication assistance program applications until complete.     Lashika Erker, PharmD, BCPS Clinical Pharmacist [facility] [phone]      

## 2019-10-26 ENCOUNTER — Other Ambulatory Visit: Payer: Self-pay | Admitting: Pharmacy Technician

## 2019-10-26 NOTE — Patient Outreach (Signed)
[facility]) Care Management  10/26/2019  Kadon Andrus 08/23/1940 [id number]   Received voicemail from patients wife stating that they received Humalog and Trulicity from [employer] and [place] from [employer] patient assistance programs.  -Will remove myself from care team  [employer]. Chana Bode, [place] Certified Pharmacy Technician [facility]

## 2019-11-03 ENCOUNTER — Other Ambulatory Visit: Payer: Self-pay

## 2019-11-06 ENCOUNTER — Other Ambulatory Visit: Payer: Self-pay

## 2019-11-06 ENCOUNTER — Encounter: Payer: Self-pay | Admitting: Endocrinology

## 2019-11-06 ENCOUNTER — Telehealth: Payer: Self-pay | Admitting: Endocrinology

## 2019-11-06 ENCOUNTER — Ambulatory Visit: Payer: Medicare Other | Admitting: Endocrinology

## 2019-11-06 VITALS — BP 118/64 | HR 64 | Ht 65.0 in | Wt 168.8 lb

## 2019-11-06 DIAGNOSIS — E1165 Type 2 diabetes mellitus with hyperglycemia: Secondary | ICD-10-CM | POA: Diagnosis not present

## 2019-11-06 DIAGNOSIS — Z794 Long term (current) use of insulin: Secondary | ICD-10-CM

## 2019-11-06 DIAGNOSIS — I10 Essential (primary) hypertension: Secondary | ICD-10-CM | POA: Diagnosis not present

## 2019-11-06 MED ORDER — TRULICITY 1.5 MG/0.5ML ~~LOC~~ SOAJ: 1.5000 mg | SUBCUTANEOUS | 0 refills | Status: DC

## 2019-11-06 NOTE — Progress Notes (Signed)
Patient ID: Evan Stanley, male   DOB: 1941/04/22, 79 y.o.   MRN: [id number]          Reason for Appointment: Follow-up for Type 2 Diabetes    History of Present Illness:          Date of diagnosis of type 2 diabetes mellitus: 2010       Background history:   He thinks he was diagnosed to have diabetes after his coronary bypass surgery and blood sugars are not very high He had been on metformin until about 05/2017 Also at some point had taken glimepiride Usually appears to have had A1c just above 7%  Recent history:   INSULIN regimen is: [place]  50  units daily, Humalog none, previously 4-5 with breakfast and usually 6 units at dinner     Non-insulin hypoglycemic drugs the patient is taking are: Trulicity 1.5 mg weekly  His A1c has been consistently below 7, now only 5.8  Current management, blood sugar patterns and problems identified:  He was told to cut back on his TRESIBA on his last visit again by 16 units because of readings as low as 60 2 in the morning  Also because of readings as low as 61 after dinner he was told to stop Humalog at dinnertime  He has been given 1.5 mg Trulicity which he takes regularly and is able to get this from the patient assistance program  Although his fasting blood sugars are excellent and fairly stable he is now getting mostly high readings after dinner  His diet is sometimes high in fat with [place] fries and fried food otherwise  Only once had a normal blood sugar after dinner his weight is about the same  Has difficulty walking because of leg pain  Currently not checking blood sugars after breakfast and only in the morning and after dinner        Side effects from medications have been: None  Glucose monitoring:  done about 2 times a day         Glucometer: One Touch.       Blood Glucose readings by review of home monitor download show   PRE-MEAL Fasting Lunch Dinner Bedtime Overall  Glucose range: 103-134      Mean/median:   118     142   POST-MEAL PC Breakfast PC Lunch PC Dinner  Glucose range:    129-325  Mean/median:    205   Previous readings:   PRE-MEAL Fasting Lunch Dinner Bedtime Overall  Glucose range: 6 2-110      Mean/median:  84     96   POST-MEAL PC Breakfast PC Lunch PC Dinner  Glucose range:    61-158  Mean/median:    109     Self-care: The diet that the patient has been following is: tries to limit sweets.     Meal times are:  Breakfast is at after 9 AM, diiner 6  Typical meal intake: Breakfast is yogurt at times, otherwise eggs            Dietician visit, most recent: 2019                Weight history:  Wt Readings from Last 3 Encounters:  11/06/19 168 lb 12.8 oz (76.6 kg)  10/02/19 166 lb 9.6 oz (75.6 kg)  09/06/19 169 lb 12.8 oz (77 kg)    Glycemic control:   Lab Results  Component Value Date   HGBA1C 5.8 (A) 09/06/2019  HGBA1C 6.6 (H) 05/14/2019   HGBA1C 6.2 04/03/2019   Lab Results  Component Value Date   MICROALBUR 5.1 (H) 09/06/2019   LDLCALC 56 05/14/2019   CREATININE 1.60 (H) 09/06/2019   Lab Results  Component Value Date   MICRALBCREAT 2.4 09/06/2019    Lab Results  Component Value Date   FRUCTOSAMINE 323 (H) 04/22/2018      Allergies as of 11/06/2019   No Known Allergies     Medication List       Accurate as of November 06, 2019 12:36 PM. If you have any questions, ask your nurse or doctor.        aspirin EC 81 MG tablet Take 1 tablet (81 mg total) by mouth daily. What changed: additional instructions   atorvastatin 40 MG tablet Commonly known as: LIPITOR TAKE 1 TABLET BY MOUTH  DAILY   cetirizine 10 MG tablet Commonly known as: ZYRTEC Take 10 mg by mouth daily.   hydrochlorothiazide 25 MG tablet Commonly known as: HYDRODIURIL Take 1 tablet (25 mg total) by mouth daily.   latanoprost 0.005 % ophthalmic solution Commonly known as: XALATAN Place 1 drop into the left eye at bedtime.   lisinopril 40 MG tablet Commonly known  as: ZESTRIL Take 1 tablet (40 mg total) by mouth daily.   metoprolol succinate 100 MG 24 hr tablet Commonly known as: TOPROL-XL Take with or immediately following a meal.   OneTouch Ultra test strip Generic drug: glucose blood USE TO CHECK BLOOD SUGAR  TWICE DAILY   onetouch ultrasoft lancets Test BS BID Dx E11.9   [name] FlexTouch 200 UNIT/ML FlexTouch Pen Generic drug: insulin degludec Inject 50 Units into the skin daily. What changed: Another medication with the same name was removed. Continue taking this medication, and follow the directions you see here. Changed by: [name], MD   Trulicity 5.53 ZS/8.2LM Sopn Generic drug: Dulaglutide Inject 0.75 mg into the skin once a week.   vitamin C 500 MG tablet Commonly known as: ASCORBIC ACID Take 500 mg by mouth daily.   vitamin E 1000 UNIT capsule Generic drug: vitamin E Take 1,000 Units by mouth daily.       Allergies: No Known Allergies  Past Medical History:  Diagnosis Date  . AKI (acute kidney injury) ([place])   . Allergy   . Anxiety   . Arthritis    Right shoulder  . Atrial fibrillation ([place])   . Benign essential HTN   . Carotid artery stenosis   . Cataract   . CHF (congestive heart failure) ([place])   . CVA (cerebral vascular accident) ([place]) 04/28/2016  . Decreased vision    left eye  . Diabetes mellitus without complication ([place])    Takes Metformin  . Hyperlipidemia   . Hypertension   . Salmonella bacteremia 04/29/2016  . Sepsis ([place]) 04/29/2016  . Stroke ([place]) 11/18/2015   no deficits  . TIA (transient ischemic attack)    affected left eye  . Urgency of urination     Past Surgical History:  Procedure Laterality Date  . AORTIC VALVE REPLACEMENT    . BACK SURGERY  80s  . CARDIAC VALVE REPLACEMENT  2010  . ENDARTERECTOMY Right 01/13/2016   Procedure: ENDARTERECTOMY CAROTID - RIGHT;  Surgeon: Conrad [place]stown, MD;  Location: [facility];  Service: Vascular;  Laterality: Right;  . EYE SURGERY    . laser  surgery, left eye  Left 10-29-2015    retinal surgery/ Deloria Lair MD   . PERIPHERAL  VASCULAR CATHETERIZATION Right 11/18/2015   Procedure: Carotid Angiography;  Surgeon: Conrad Cantrall, MD;  Location: [facility];  Service: Cardiovascular;  Laterality: Right;  . PERIPHERAL VASCULAR CATHETERIZATION N/A 11/18/2015   Procedure: Aortic Arch Angiography;  Surgeon: Conrad [place], MD;  Location: [facility];  Service: Cardiovascular;  Laterality: N/A;  . TEE WITHOUT CARDIOVERSION N/A 05/05/2016   Procedure: TRANSESOPHAGEAL ECHOCARDIOGRAM (TEE);  Surgeon: Lelon Perla, MD;  Location: [facility];  Service: Cardiovascular;  Laterality: N/A;    Family History  Problem Relation Age of Onset  . Heart disease Mother   . Cancer Mother        breast  . Stroke Father 66  . Hypertension Father   . Early death Sister        infant death  . Heart disease Brother   . Hydrocephalus Brother   . Heart disease Brother   . Diabetes Brother   . Cancer Brother        prostat  . Healthy Son   . Healthy Son     Social History:  reports that he quit smoking about 33 years ago. His smoking use included cigarettes. He has a 40.00 pack-year smoking history. He has never used smokeless tobacco. He reports that he does not drink alcohol or use drugs.   Review of Systems   Lipid history: Currently on atorvastatin 40 mg daily prescribed by his PCP   Has history of CAD and CVD    Lab Results  Component Value Date   CHOL 103 05/14/2019   HDL 27 (L) 05/14/2019   LDLCALC 56 05/14/2019   LDLDIRECT 62 02/16/2017   TRIG 100 05/14/2019   CHOLHDL 3.8 05/14/2019           Hypertension:  On multiple drugs including hydrochlorothiazide 25 mg and lisinopril 40 mg daily,   prescribed by PCP he has checked at home and reported to his PCP  Blood pressure has been relatively low and he had been feeling dizzy but now appears more normal, amlodipine was stopped by PCP  BP Readings from Last 3  Encounters:  11/06/19 118/64  10/02/19 108/66  09/06/19 110/60     CKD: Not followed by nephrology  Lab Results  Component Value Date   CREATININE 1.60 (H) 09/06/2019   CREATININE 2.00 (H) 05/13/2019   CREATININE 1.98 (H) 05/13/2019     Most recent foot exam: 9/20    Physical Examination:  BP 118/64 (BP Location: Left Arm, Patient Position: Sitting, Cuff Size: Normal)   Pulse 64   Ht 5' 5"  (1.651 m)   Wt 168 lb 12.8 oz (76.6 kg)   SpO2 98%   BMI 28.09 kg/m       ASSESSMENT:  Diabetes type 2, on insulin   See history of present illness for detailed discussion of current diabetes management, blood sugar patterns and problems identified  His A1c was 5.8 on the last visit  Over the last few months he has had progressive decline in his basal insulin requirement He is now taking only 50 units compared to 80 units last year  However with stopping even a small dose of 6 units Humalog at dinnertime his blood sugars are averaging over 200 at night However some of his high readings are related to higher fat meals Previously also taking Humalog at breakfast but not clear if he is having high readings after breakfast also He is mostly eating yogurt in the morning  His weight is  about the same and not having decreased appetite as before    HYPERTENSION: Blood pressure is normal now and he is not feeling dizzy with stopping amlodipine   CKD: Creatinine was certainly better on the last visit, not followed by nephrologist  PLAN:    No change in [place], continue 50 units  Discussed that if his morning sugars tend to get lower he will reduce the dose by 4 units  He can go back up to 1.5 mg of Trulicity again as a trial to help his postprandial readings  However we will need to make sure he does not have anorexia again  We will need to take 6 units Humalog at dinnertime again as before  Discussed importance of adjusting this based on her meal size and may reduce it  to 4 units for smaller meals and skip the dose if having only low-fat noncarbohydrate meal  Discussed that he needs to check readings after breakfast in the morning and may skip the fasting readings at times  Cut back on fried foods  Have a protein with every meal consistently  Labs for renal function on the next visit  Continue to leave off amlodipine for blood pressure  Patient Instructions  Humalog 4-6 at supper,   Check blood sugars on waking up 3 days a week  Also check blood sugars about 2 hours after meals and do this after different meals by rotation  Recommended blood sugar levels on waking up are 90-130 and about 2 hours after meal is 130-160  Please bring your blood sugar monitor to each visit, thank you  Trulicity 2 shots at 1 time and use 1.5 weekly        [name] 11/06/2019, 12:36 PM   Note: This office note was prepared with Dragon voice recognition system technology. Any transcriptional errors that result from this process are unintentional.  Addendum: Labs unremarkable

## 2019-11-06 NOTE — Patient Instructions (Addendum)
Humalog 4-6 at supper,   Check blood sugars on waking up 3 days a week  Also check blood sugars about 2 hours after meals and do this after different meals by rotation  Recommended blood sugar levels on waking up are 90-130 and about 2 hours after meal is 130-160  Please bring your blood sugar monitor to each visit, thank you  Trulicity 2 shots at 1 time and use 1.5 weekly

## 2019-11-06 NOTE — Telephone Encounter (Signed)
Rx sent 

## 2019-11-06 NOTE — Telephone Encounter (Signed)
Patient's wife called requesting Trulicity to go through the   [place], [place] Phone:  [phone]  Fax:  [phone]

## 2019-11-07 ENCOUNTER — Other Ambulatory Visit: Payer: Self-pay | Admitting: Family Medicine

## 2019-11-07 DIAGNOSIS — E1169 Type 2 diabetes mellitus with other specified complication: Secondary | ICD-10-CM

## 2019-11-20 ENCOUNTER — Telehealth: Payer: Self-pay

## 2019-11-20 NOTE — Telephone Encounter (Signed)
Patient's wife Evan Stanley called and wants all RX for his DM to go through [facility].   She states that he is good at this point.   Please do not send to anywhere but [facility].

## 2019-11-20 NOTE — Telephone Encounter (Signed)
Trulicity has never been sent anywhere other than [facility], besides once in the past when pt's wife called and specifically requested that Trulicity be sent to [facility].

## 2019-11-23 NOTE — Progress Notes (Signed)
Cardiology Office Note   Date:  11/24/2019   ID:  Stanley, Evan Stanley, MRN [id number]  PCP:  [name], DO  Cardiologist: Dr. [name], MD   Chief Complaint  Patient presents with  . Follow-up    History of Present Illness: Evan Stanley is a 79 y.o. male who presents for one year follow up, seen for Dr. [name].   [name] has a hx of bioprosthetic aortic valve 2010,hyperlipidemia,coronary artery disease with prior bypass grafting 2010, left ventricular systolic dysfunction, right cerebral CVA (2017), total occlusion left ICA, RCEA, and DM type II.  He was referred to Dr. [name] in the setting of lower extremity pain found to have an ABI of 0.78 on the right and 1.14 on the left.  Duplex showed short occlusion of the right distal SFA with occluded posterior tibial artery. On the left, there was moderate SFA disease with occluded anterior tibial artery. Revascularization was not recommended at that time however could be considered if symptoms worsen in the future. Per Dr. [name], given the presence of coronary artery disease and peripheral arterial disease, we should consider adding a small dose Xarelto.  He was last seen by Dr. [name] 11/23/2018 and was doing well from a CV standpoint. He was playing golf and reported that his endurance was improving.   Today [name] is doing well. He reports he continues to play golf, played 18 holes yesterday without any issues.  Previous leg pain has resolved.  States that he is started wearing a knee brace for right knee pain while playing golf which is helped.  Was recently seen by his PCP for full lab work.  Reports his hemoglobin A1c is down in the 5.0 range.  Has also started seeing a nephrologist due to his chronic kidney disease.  No changes have been made.  They will continue close surveillance.  Denies chest pain, shortness of breath, LE edema, PND, orthopnea, palpitations, dizziness or syncope.   Past Medical History:    Diagnosis Date  . AKI (acute kidney injury) ([place])   . Allergy   . Anxiety   . Arthritis    Right shoulder  . Atrial fibrillation ([name])   . Benign essential HTN   . Carotid artery stenosis   . Cataract   . CHF (congestive heart failure) ([place])   . CVA (cerebral vascular accident) ([name]) 04/28/2016  . Decreased vision    left eye  . Diabetes mellitus without complication ([name])    Takes Metformin  . Hyperlipidemia   . Hypertension   . Salmonella bacteremia 04/29/2016  . Sepsis ([place]) 04/29/2016  . Stroke ([name]) 11/18/2015   no deficits  . TIA (transient ischemic attack)    affected left eye  . Urgency of urination     Past Surgical History:  Procedure Laterality Date  . AORTIC VALVE REPLACEMENT    . BACK SURGERY  80s  . CARDIAC VALVE REPLACEMENT  2010  . ENDARTERECTOMY Right 01/13/2016   Procedure: ENDARTERECTOMY CAROTID - RIGHT;  Surgeon: Conrad Brewster, MD;  Location: [facility];  Service: Vascular;  Laterality: Right;  . EYE SURGERY    . laser surgery, left eye  Left 10-29-2015    retinal surgery/ Deloria Lair MD   . PERIPHERAL VASCULAR CATHETERIZATION Right 11/18/2015   Procedure: Carotid Angiography;  Surgeon: Conrad Ualapue, MD;  Location: [facility];  Service: Cardiovascular;  Laterality: Right;  . PERIPHERAL VASCULAR CATHETERIZATION N/A 11/18/2015  Procedure: Aortic Arch Angiography;  Surgeon: Conrad Cambria, MD;  Location: [facility];  Service: Cardiovascular;  Laterality: N/A;  . TEE WITHOUT CARDIOVERSION N/A 05/05/2016   Procedure: TRANSESOPHAGEAL ECHOCARDIOGRAM (TEE);  Surgeon: Lelon Perla, MD;  [facility];  Service: Cardiovascular;  Laterality: N/A;     Current Outpatient Medications  Medication Sig Dispense Refill  . amLODipine (NORVASC) 10 MG tablet TAKE 1 TABLET BY MOUTH  DAILY 90 tablet 1  . aspirin EC 81 MG tablet Take 1 tablet (81 mg total) by mouth daily. 90 tablet 3  . atorvastatin (LIPITOR) 40 MG tablet TAKE 1 TABLET BY MOUTH  DAILY 90  tablet 1  . cetirizine (ZYRTEC) 10 MG tablet Take 10 mg by mouth daily.    . Dulaglutide (TRULICITY) 1.5 KZ/6.0FU SOPN Inject 1.5 mg into the skin once a week. 6 mL 0  . hydrochlorothiazide (HYDRODIURIL) 25 MG tablet Take 1 tablet (25 mg total) by mouth daily. 90 tablet 1  . insulin degludec (TRESIBA FLEXTOUCH) 200 UNIT/ML FlexTouch Pen Inject 50 Units into the skin daily.    . insulin lispro (HUMALOG) 100 UNIT/ML injection Inject 4-6 Units into the skin once.    . Lancets (ONETOUCH ULTRASOFT) lancets Test BS BID Dx E11.9 200 each 3  . latanoprost (XALATAN) 0.005 % ophthalmic solution Place 1 drop into the left eye at bedtime.     Marland Kitchen lisinopril (ZESTRIL) 40 MG tablet Take 1 tablet (40 mg total) by mouth daily. 90 tablet 1  . metoprolol succinate (TOPROL-XL) 100 MG 24 hr tablet Take with or immediately following a meal. 90 tablet 1  . ONETOUCH ULTRA test strip USE TO CHECK BLOOD SUGAR  TWICE DAILY 200 strip 3  . vitamin C (ASCORBIC ACID) 500 MG tablet Take 500 mg by mouth daily.    . vitamin E (VITAMIN E) 1000 UNIT capsule Take 1,000 Units by mouth daily.     No current facility-administered medications for this visit.    Allergies:   Patient has no known allergies.    Social History:  The patient  reports that he quit smoking about 33 years ago. His smoking use included cigarettes. He has a 40.00 pack-year smoking history. He has never used smokeless tobacco. He reports that he does not drink alcohol or use drugs.   Family History:  The patient's family history includes Cancer in his brother and mother; Diabetes in his brother; Early death in his sister; Healthy in his son and son; Heart disease in his brother, brother, and mother; Hydrocephalus in his brother; Hypertension in his father; Stroke (age of onset: 70) in his father.   ROS:  Please see the history of present illness.   Otherwise, review of systems are positive for none.  All other systems are reviewed and negative.   PHYSICAL  EXAM: VS:  BP 130/70   Pulse 69   Ht 5' 5"  (1.651 m)   Wt 168 lb (76.2 kg)   SpO2 98%   BMI 27.96 kg/m  , BMI Body mass index is 27.96 kg/m.   General: Well developed, well nourished, NAD Skin: Warm, dry, intact  Head: Normocephalic, atraumatic, sclera non-icteric, no xanthomas, clear, moist mucus membranes. Neck: Negative for carotid bruits. No JVD Lungs:Clear to ausculation bilaterally. No wheezes, rales, or rhonchi. Breathing is unlabored. Cardiovascular: RRR with S1 S2. No murmurs, rubs, gallops, or LV heave appreciated. Abdomen: Soft, non-tender, non-distended with normoactive bowel sounds. No hepatomegaly, No rebound/guarding. No obvious abdominal masses. MSK: Strength  and tone appear normal for age. 5/5 in all extremities Extremities: No edema. No clubbing or cyanosis. DP/PT pulses 2+ bilaterally Neuro: Alert and oriented. No focal deficits. No facial asymmetry. MAE spontaneously. Psych: Responds to questions appropriately with normal affect.     EKG:  EKG is not ordered today. The ekg from 04/2019 with NSR and no acute changes on my personal review.    Recent Labs: 05/13/2019: Hemoglobin 15.3; Platelets 184 09/06/2019: ALT 22; BUN 25; Creatinine, Ser 1.60; Potassium 4.3; Sodium 140; TSH 1.93    Lipid Panel    Component Value Date/Time   CHOL 103 05/14/2019 0413   CHOL 100 08/23/2017 0850   TRIG 100 05/14/2019 0413   HDL 27 (L) 05/14/2019 0413   HDL 32 (L) 08/23/2017 0850   CHOLHDL 3.8 05/14/2019 0413   VLDL 20 05/14/2019 0413   LDLCALC 56 05/14/2019 0413   LDLCALC 49 08/23/2017 0850   LDLDIRECT 62 02/16/2017 1151     Wt Readings from Last 3 Encounters:  11/24/19 168 lb (76.2 kg)  11/06/19 168 lb 12.8 oz (76.6 kg)  10/02/19 166 lb 9.6 oz (75.6 kg)    Other studies Reviewed: Additional studies/ records that were reviewed today include:   Echocardiogram 04/2019:  1. Left ventricular ejection fraction, by visual estimation, is 55 to  60%. The left  ventricle has normal function. Normal left ventricular size.  There is no left ventricular hypertrophy.  2. Global right ventricle has normal systolic function.The right  ventricular size is normal. No increase in right ventricular wall  thickness.  3. Left atrial size was mildly dilated.  4. Right atrial size was normal.  5. Moderate mitral annular calcification.  6. The mitral valve is abnormal. Trace mitral valve regurgitation.  7. The tricuspid valve is grossly normal. Tricuspid valve regurgitation  is trivial.  8. The aortic valve was not well visualized Aortic valve regurgitation  was not visualized by color flow Doppler.  9. The pulmonic valve was not well visualized. Pulmonic valve  regurgitation is trivial by color flow Doppler.   ASSESSMENT AND PLAN:  1. CAD s/p CABG with AVR in 2010: -No anginal symptoms or symptoms of dizziness or SOB -Plan for valve surveillence at next OV -Continue ASA, beta-blocker, statin  2. HTN: -Stable, 130/70 -Continue current regimen  3. HLD: -Last LDL, 56 on 05/14/2019 -Goal LDL <70 -Continue statin therapy>>no issues  -Lipid panel monitored per PCP  4. CKD Stage III: -Creatinine, 1.60 on 09/06/2019 -Baseline appears to be in the 1.6-1.9 range  -Follows with nephrology with no change>> continued close surveillance  5. DM2: -Trulicity added by PCP with patient reporting a hemoglobin A1c in the 5.0 range>> congratulated  -Per PCP  6. PVD: -Followed by Dr. Arida>>>no revascularization recommended at that time however would consider if symptoms worsen -Per note, consider low dose Xarelto given hx of CAD and PVD>> denies recurrent symptoms   Current medicines are reviewed at length with the patient today.  The patient does not have concerns regarding medicines.  The following changes have been made:  no change  Labs/ tests ordered today include: None  No orders of the defined types were placed in this  encounter.  Disposition:   FU with Dr. [name] in 1 year  Signed, Kathyrn Drown, NP  11/24/2019 [address] [place], [place], [place]  [id number] Phone: [phone]; Fax: [phone]

## 2019-11-24 ENCOUNTER — Ambulatory Visit: Payer: Medicare Other | Admitting: Cardiology

## 2019-11-24 ENCOUNTER — Other Ambulatory Visit: Payer: Self-pay

## 2019-11-24 ENCOUNTER — Encounter: Payer: Self-pay | Admitting: Cardiology

## 2019-11-24 VITALS — BP 130/70 | HR 69 | Ht 65.0 in | Wt 168.0 lb

## 2019-11-24 DIAGNOSIS — E1122 Type 2 diabetes mellitus with diabetic chronic kidney disease: Secondary | ICD-10-CM | POA: Diagnosis not present

## 2019-11-24 DIAGNOSIS — I152 Hypertension secondary to endocrine disorders: Secondary | ICD-10-CM

## 2019-11-24 DIAGNOSIS — Z953 Presence of xenogenic heart valve: Secondary | ICD-10-CM

## 2019-11-24 DIAGNOSIS — I739 Peripheral vascular disease, unspecified: Secondary | ICD-10-CM

## 2019-11-24 DIAGNOSIS — N183 Chronic kidney disease, stage 3 unspecified: Secondary | ICD-10-CM

## 2019-11-24 DIAGNOSIS — E1121 Type 2 diabetes mellitus with diabetic nephropathy: Secondary | ICD-10-CM | POA: Diagnosis not present

## 2019-11-24 DIAGNOSIS — I2510 Atherosclerotic heart disease of native coronary artery without angina pectoris: Secondary | ICD-10-CM

## 2019-11-24 DIAGNOSIS — E1159 Type 2 diabetes mellitus with other circulatory complications: Secondary | ICD-10-CM

## 2019-11-24 DIAGNOSIS — I10 Essential (primary) hypertension: Secondary | ICD-10-CM

## 2019-11-24 DIAGNOSIS — E7841 Elevated Lipoprotein(a): Secondary | ICD-10-CM

## 2019-11-24 DIAGNOSIS — N1831 Chronic kidney disease, stage 3a: Secondary | ICD-10-CM

## 2019-11-24 NOTE — Patient Instructions (Signed)
Medication Instructions:   Your physician recommends that you continue on your current medications as directed. Please refer to the Current Medication list given to you today.  *If you need a refill on your cardiac medications before your next appointment, please call your pharmacy*  Lab Work:  None ordered today  Testing/Procedures:  None ordered today  Follow-Up: At [facility], you and your health needs are our priority.  As part of our continuing mission to provide you with exceptional heart care, we have created designated Provider Care Teams.  These Care Teams include your primary Cardiologist (physician) and Advanced Practice Providers (APPs -  Physician Assistants and Nurse Practitioners) who all work together to provide you with the care you need, when you need it.  We recommend signing up for the patient portal called "MyChart".  Sign up information is provided on this After Visit Summary.  MyChart is used to connect with patients for Virtual Visits (Telemedicine).  Patients are able to view lab/test results, encounter notes, upcoming appointments, etc.  Non-urgent messages can be sent to your provider as well.   To learn more about what you can do with MyChart, go to [email or web address].    Your next appointment:   12 month(s)  The format for your next appointment:   In Person  Provider:   Daneen Schick, MD

## 2019-11-29 ENCOUNTER — Other Ambulatory Visit: Payer: Self-pay

## 2019-11-29 ENCOUNTER — Encounter (INDEPENDENT_AMBULATORY_CARE_PROVIDER_SITE_OTHER): Payer: Self-pay | Admitting: Ophthalmology

## 2019-11-29 ENCOUNTER — Ambulatory Visit (INDEPENDENT_AMBULATORY_CARE_PROVIDER_SITE_OTHER): Payer: Medicare Other | Admitting: Ophthalmology

## 2019-11-29 DIAGNOSIS — H211X2 Other vascular disorders of iris and ciliary body, left eye: Secondary | ICD-10-CM

## 2019-11-29 DIAGNOSIS — H43811 Vitreous degeneration, right eye: Secondary | ICD-10-CM | POA: Diagnosis not present

## 2019-11-29 DIAGNOSIS — H34232 Retinal artery branch occlusion, left eye: Secondary | ICD-10-CM

## 2019-11-29 DIAGNOSIS — E1122 Type 2 diabetes mellitus with diabetic chronic kidney disease: Secondary | ICD-10-CM

## 2019-11-29 DIAGNOSIS — E1121 Type 2 diabetes mellitus with diabetic nephropathy: Secondary | ICD-10-CM | POA: Diagnosis not present

## 2019-11-29 DIAGNOSIS — N1832 Chronic kidney disease, stage 3b: Secondary | ICD-10-CM

## 2019-11-29 DIAGNOSIS — H4052X3 Glaucoma secondary to other eye disorders, left eye, severe stage: Secondary | ICD-10-CM | POA: Diagnosis not present

## 2019-11-29 DIAGNOSIS — H3412 Central retinal artery occlusion, left eye: Secondary | ICD-10-CM | POA: Insufficient documentation

## 2019-11-29 NOTE — Assessment & Plan Note (Signed)
No detectable diabetic retinopathy right eye.

## 2019-11-29 NOTE — Progress Notes (Signed)
11/29/2019     CHIEF COMPLAINT Patient presents for Diabetic Eye Exam   HISTORY OF PRESENT ILLNESS: Evan Stanley is a 79 y.o. male who presents to the clinic today for:   HPI    Diabetic Eye Exam    Vision is stable.  Associated Symptoms Negative for Floaters and Flashes.  Diabetes characteristics include Type 2.  Blood sugar level is controlled.  Last Blood Glucose 139.  Last A1C 5.2.  I, the attending physician,  performed the HPI with the patient and updated documentation appropriately.          Comments    1 Year Diabetic Exam OU. OCT and FP  Pt states vision is stable. Denies FOL and floaters. Pt has been using gtts as directed.       Last edited by Tilda Franco on 11/29/2019  8:42 AM. (History)      Referring physician: [name], DO [place],  [place] [id number]  HISTORICAL INFORMATION:   Selected notes from the MEDICAL RECORD NUMBER    Lab Results  Component Value Date   HGBA1C 5.8 (A) 09/06/2019     CURRENT MEDICATIONS: Current Outpatient Medications (Ophthalmic Drugs)  Medication Sig  . latanoprost (XALATAN) 0.005 % ophthalmic solution Place 1 drop into the left eye at bedtime.    No current facility-administered medications for this visit. (Ophthalmic Drugs)   Current Outpatient Medications (Other)  Medication Sig  . amLODipine (NORVASC) 10 MG tablet TAKE 1 TABLET BY MOUTH  DAILY  . aspirin EC 81 MG tablet Take 1 tablet (81 mg total) by mouth daily.  Marland Kitchen atorvastatin (LIPITOR) 40 MG tablet TAKE 1 TABLET BY MOUTH  DAILY  . cetirizine (ZYRTEC) 10 MG tablet Take 10 mg by mouth daily.  . Dulaglutide (TRULICITY) 1.5 JX/9.1YN SOPN Inject 1.5 mg into the skin once a week.  . hydrochlorothiazide (HYDRODIURIL) 25 MG tablet Take 1 tablet (25 mg total) by mouth daily.  . insulin degludec (TRESIBA FLEXTOUCH) 200 UNIT/ML FlexTouch Pen Inject 50 Units into the skin daily.  . insulin lispro (HUMALOG) 100 UNIT/ML injection Inject 4-6 Units  into the skin once.  . Lancets (ONETOUCH ULTRASOFT) lancets Test BS BID Dx E11.9  . lisinopril (ZESTRIL) 40 MG tablet Take 1 tablet (40 mg total) by mouth daily.  . metoprolol succinate (TOPROL-XL) 100 MG 24 hr tablet Take with or immediately following a meal.  . ONETOUCH ULTRA test strip USE TO CHECK BLOOD SUGAR  TWICE DAILY  . vitamin C (ASCORBIC ACID) 500 MG tablet Take 500 mg by mouth daily.  . vitamin E (VITAMIN E) 1000 UNIT capsule Take 1,000 Units by mouth daily.   No current facility-administered medications for this visit. (Other)      REVIEW OF SYSTEMS: ROS    Positive for: Endocrine   Last edited by Tilda Franco on 11/29/2019  8:42 AM. (History)       ALLERGIES No Known Allergies  PAST MEDICAL HISTORY Past Medical History:  Diagnosis Date  . AKI (acute kidney injury) ([place])   . Allergy   . Anxiety   . Arthritis    Right shoulder  . Atrial fibrillation ([place])   . Benign essential HTN   . Carotid artery stenosis   . Cataract   . CHF (congestive heart failure) ([place])   . CVA (cerebral vascular accident) ([place]) 04/28/2016  . Decreased vision    left eye  . Diabetes mellitus without complication ([place])  Takes Metformin  . Hyperlipidemia   . Hypertension   . Salmonella bacteremia 04/29/2016  . Sepsis ([place]) 04/29/2016  . Stroke ([place]) 11/18/2015   no deficits  . TIA (transient ischemic attack)    affected left eye  . Urgency of urination    Past Surgical History:  Procedure Laterality Date  . AORTIC VALVE REPLACEMENT    . BACK SURGERY  80s  . CARDIAC VALVE REPLACEMENT  2010  . ENDARTERECTOMY Right 01/13/2016   Procedure: ENDARTERECTOMY CAROTID - RIGHT;  Surgeon: Conrad West Wood, MD;  Location: [facility];  Service: Vascular;  Laterality: Right;  . EYE SURGERY    . laser surgery, left eye  Left 10-29-2015    retinal surgery/ Deloria Lair MD   . PERIPHERAL VASCULAR CATHETERIZATION Right 11/18/2015   Procedure: Carotid Angiography;  Surgeon: Conrad [place], MD;   Location: [facility];  Service: Cardiovascular;  Laterality: Right;  . PERIPHERAL VASCULAR CATHETERIZATION N/A 11/18/2015   Procedure: Aortic Arch Angiography;  Surgeon: Conrad Jenks, MD;  Location: [facility];  Service: Cardiovascular;  Laterality: N/A;  . TEE WITHOUT CARDIOVERSION N/A 05/05/2016   Procedure: TRANSESOPHAGEAL ECHOCARDIOGRAM (TEE);  Surgeon: Lelon Perla, MD;  Location: [facility];  Service: Cardiovascular;  Laterality: N/A;    FAMILY HISTORY Family History  Problem Relation Age of Onset  . Heart disease Mother   . Cancer Mother        breast  . Stroke Father 70  . Hypertension Father   . Early death Sister        infant death  . Heart disease Brother   . Hydrocephalus Brother   . Heart disease Brother   . Diabetes Brother   . Cancer Brother        prostat  . Healthy Son   . Healthy Son     SOCIAL HISTORY Social History   Tobacco Use  . Smoking status: Former Smoker    Packs/day: 2.00    Years: 20.00    Pack years: 40.00    Types: Cigarettes    Quit date: 07/20/1986    Years since quitting: 33.3  . Smokeless tobacco: Never Used  Substance Use Topics  . Alcohol use: No    Alcohol/week: 0.0 standard drinks  . Drug use: No         OPHTHALMIC EXAM:  Base Eye Exam    Visual Acuity (Snellen - Linear)      Right Left   Dist [place] 20/25 + HM       Tonometry (Tonopen, 8:48 AM)      Right Left   Pressure 13 13       Pupils      Pupils Dark Light Shape React APD   Right PERRL 4 3 Round Brisk None   Left PERRL 6 6 Irregular Minimal None       Visual Fields (Counting fingers)      Left Right     Full   Restrictions Total superior temporal, inferior temporal, superior nasal, inferior nasal deficiencies        Neuro/Psych    Oriented x3: Yes   Mood/Affect: Normal       Dilation    Both eyes: 1.0% Mydriacyl, 2.5% Phenylephrine @ 8:48 AM        Slit Lamp and Fundus Exam    External Exam      Right Left   External  Normal Normal       Slit Lamp  Exam      Right Left   Lids/Lashes Normal Normal   Conjunctiva/Sclera White and quiet White and quiet   Cornea Clear Clear   Anterior Chamber Deep and quiet Deep and quiet   Iris Round and reactive    Lens Posterior chamber intraocular lens Posterior chamber intraocular lens   Anterior Vitreous Normal Normal       Fundus Exam      Right Left   Posterior Vitreous Posterior vitreous detachment Posterior vitreous detachment   Disc Normal 2+ Pallor   C/D Ratio 0.35 0.75   Macula  Normal   Vessels Normal Old central retinal artery occlusion   Periphery Normal [place] posteriorly          IMAGING AND PROCEDURES  Imaging and Procedures for 11/29/19  OCT, Retina - OU - Both Eyes       Right Eye Quality was good. Scan locations included subfoveal. Central Foveal Thickness: 274. Findings include normal observations.   Left Eye Quality was good. Scan locations included subfoveal. Central Foveal Thickness: 333. Progression has been stable. Findings include abnormal foveal contour.   Notes OD with posterior vitreous detachment  Foveal atrophy OS, with vitreal macular traction temporally, no foveal distortion observed       Color Fundus Photography Optos - OU - Both Eyes       Right Eye Progression has been stable. Disc findings include normal observations. Macula : normal observations. Periphery : normal observations.   Left Eye Progression has been stable. Disc findings include increased cup to disc ratio, pallor. Vessels : attenuated.   Notes OS, good PRP room temporally.  Clear media.  Old central retinal artery occlusion OS with optic pallor                ASSESSMENT/PLAN:  Type 2 diabetes mellitus (HCC) No detectable diabetic retinopathy right eye.      ICD-10-CM   1. Type 2 diabetes mellitus with stage 3b chronic kidney disease, without long-term current use of insulin (HCC)  E11.21    N18.32   2. Posterior vitreous  detachment of right eye  H43.811 Color Fundus Photography Optos - OU - Both Eyes  3. Branch retinal artery occlusion of left eye  H34.232 OCT, Retina - OU - Both Eyes    Color Fundus Photography Optos - OU - Both Eyes  4. Rubeosis iridis of left eye  H21.1X2   5. Glaucoma associated with ocular disorder, left, severe stage  H40.52X3     1.  OS, history of retinal artery occlusion, neovascular glaucoma.  Status post PRP and control of neovascular glaucoma via aqueous shunt.  All of these conditions are stable.  Iris neovascularization is chronic and old and not progressive will observe  2.  Posterior vitreous detachment OD, no therapy needed.  No retinal vascular disease OD  3.  No detectable diabetic retinopathy right eye 4.  Return visit 1 year Ophthalmic Meds Ordered this visit:  No orders of the defined types were placed in this encounter.      Return in about 1 year (around 11/28/2020) for DILATE OU, COLOR FP.  There are no Patient Instructions on file for this visit.   Explained the diagnoses, plan, and follow up with the patient and they expressed understanding.  Patient expressed understanding of the importance of proper follow up care.   Clent Demark Victormanuel Mclure M.D. Diseases & Surgery of the Retina and Vitreous Retina & Diabetic [place] 11/29/19     Abbreviations:  M myopia (nearsighted); A astigmatism; H hyperopia (farsighted); P presbyopia; Mrx spectacle prescription;  CTL contact lenses; OD right eye; OS left eye; OU both eyes  XT exotropia; ET esotropia; PEK punctate epithelial keratitis; PEE punctate epithelial erosions; DES dry eye syndrome; MGD meibomian gland dysfunction; ATs artificial tears; PFAT's preservative free artificial tears; [place] nuclear sclerotic cataract; PSC posterior subcapsular cataract; ERM epi-retinal membrane; PVD posterior vitreous detachment; RD retinal detachment; DM diabetes mellitus; DR diabetic retinopathy; NPDR non-proliferative diabetic  retinopathy; PDR proliferative diabetic retinopathy; CSME clinically significant macular edema; DME diabetic macular edema; dbh dot blot hemorrhages; CWS cotton wool spot; POAG primary open angle glaucoma; C/D cup-to-disc ratio; HVF humphrey visual field; GVF goldmann visual field; OCT optical coherence tomography; IOP intraocular pressure; BRVO Branch retinal vein occlusion; CRVO central retinal vein occlusion; CRAO central retinal artery occlusion; BRAO branch retinal artery occlusion; RT retinal tear; SB scleral buckle; PPV pars plana vitrectomy; VH Vitreous hemorrhage; PRP panretinal laser photocoagulation; IVK intravitreal kenalog; VMT vitreomacular traction; MH Macular hole;  NVD neovascularization of the disc; NVE neovascularization elsewhere; AREDS age related eye disease study; ARMD age related macular degeneration; POAG primary open angle glaucoma; EBMD epithelial/anterior basement membrane dystrophy; ACIOL anterior chamber intraocular lens; IOL intraocular lens; PCIOL posterior chamber intraocular lens; Phaco/IOL phacoemulsification with intraocular lens placement; [place] photorefractive keratectomy; LASIK laser assisted in situ keratomileusis; HTN hypertension; DM diabetes mellitus; COPD chronic obstructive pulmonary disease

## 2019-12-20 DIAGNOSIS — R531 Weakness: Secondary | ICD-10-CM | POA: Diagnosis not present

## 2019-12-20 DIAGNOSIS — W19XXXA Unspecified fall, initial encounter: Secondary | ICD-10-CM | POA: Diagnosis not present

## 2019-12-20 DIAGNOSIS — Z743 Need for continuous supervision: Secondary | ICD-10-CM | POA: Diagnosis not present

## 2019-12-21 ENCOUNTER — Telehealth: Payer: Self-pay | Admitting: Family Medicine

## 2019-12-21 ENCOUNTER — Emergency Department (HOSPITAL_COMMUNITY): Payer: Medicare Other

## 2019-12-21 ENCOUNTER — Telehealth: Payer: Self-pay | Admitting: *Deleted

## 2019-12-21 ENCOUNTER — Encounter: Payer: Self-pay | Admitting: *Deleted

## 2019-12-21 ENCOUNTER — Emergency Department (HOSPITAL_COMMUNITY)
Admission: EM | Admit: 2019-12-21 | Discharge: 2019-12-21 | Disposition: A | Discharge status: Home / Self Care | Payer: Medicare Other | Source: Home / Self Care | Attending: Emergency Medicine | Admitting: Emergency Medicine

## 2019-12-21 ENCOUNTER — Encounter (HOSPITAL_COMMUNITY): Payer: Self-pay

## 2019-12-21 DIAGNOSIS — I6521 Occlusion and stenosis of right carotid artery: Secondary | ICD-10-CM | POA: Insufficient documentation

## 2019-12-21 DIAGNOSIS — S0990XA Unspecified injury of head, initial encounter: Secondary | ICD-10-CM | POA: Diagnosis not present

## 2019-12-21 DIAGNOSIS — R29700 NIHSS score 0: Secondary | ICD-10-CM | POA: Diagnosis not present

## 2019-12-21 DIAGNOSIS — I509 Heart failure, unspecified: Secondary | ICD-10-CM | POA: Insufficient documentation

## 2019-12-21 DIAGNOSIS — I4891 Unspecified atrial fibrillation: Secondary | ICD-10-CM | POA: Insufficient documentation

## 2019-12-21 DIAGNOSIS — E1165 Type 2 diabetes mellitus with hyperglycemia: Secondary | ICD-10-CM | POA: Diagnosis not present

## 2019-12-21 DIAGNOSIS — I63421 Cerebral infarction due to embolism of right anterior cerebral artery: Secondary | ICD-10-CM | POA: Diagnosis not present

## 2019-12-21 DIAGNOSIS — E1159 Type 2 diabetes mellitus with other circulatory complications: Secondary | ICD-10-CM | POA: Diagnosis not present

## 2019-12-21 DIAGNOSIS — I10 Essential (primary) hypertension: Secondary | ICD-10-CM | POA: Diagnosis not present

## 2019-12-21 DIAGNOSIS — Z79899 Other long term (current) drug therapy: Secondary | ICD-10-CM | POA: Insufficient documentation

## 2019-12-21 DIAGNOSIS — M25462 Effusion, left knee: Secondary | ICD-10-CM | POA: Diagnosis not present

## 2019-12-21 DIAGNOSIS — E1122 Type 2 diabetes mellitus with diabetic chronic kidney disease: Secondary | ICD-10-CM | POA: Diagnosis not present

## 2019-12-21 DIAGNOSIS — M5136 Other intervertebral disc degeneration, lumbar region: Secondary | ICD-10-CM | POA: Diagnosis not present

## 2019-12-21 DIAGNOSIS — E119 Type 2 diabetes mellitus without complications: Secondary | ICD-10-CM | POA: Insufficient documentation

## 2019-12-21 DIAGNOSIS — R296 Repeated falls: Secondary | ICD-10-CM | POA: Diagnosis not present

## 2019-12-21 DIAGNOSIS — M1712 Unilateral primary osteoarthritis, left knee: Secondary | ICD-10-CM | POA: Diagnosis not present

## 2019-12-21 DIAGNOSIS — W19XXXA Unspecified fall, initial encounter: Secondary | ICD-10-CM

## 2019-12-21 DIAGNOSIS — I110 Hypertensive heart disease with heart failure: Secondary | ICD-10-CM | POA: Insufficient documentation

## 2019-12-21 DIAGNOSIS — E1151 Type 2 diabetes mellitus with diabetic peripheral angiopathy without gangrene: Secondary | ICD-10-CM | POA: Diagnosis not present

## 2019-12-21 DIAGNOSIS — I69398 Other sequelae of cerebral infarction: Secondary | ICD-10-CM | POA: Diagnosis not present

## 2019-12-21 DIAGNOSIS — M25519 Pain in unspecified shoulder: Secondary | ICD-10-CM | POA: Diagnosis not present

## 2019-12-21 DIAGNOSIS — Z20822 Contact with and (suspected) exposure to covid-19: Secondary | ICD-10-CM | POA: Diagnosis not present

## 2019-12-21 DIAGNOSIS — Z8673 Personal history of transient ischemic attack (TIA), and cerebral infarction without residual deficits: Secondary | ICD-10-CM | POA: Insufficient documentation

## 2019-12-21 DIAGNOSIS — I480 Paroxysmal atrial fibrillation: Secondary | ICD-10-CM | POA: Diagnosis not present

## 2019-12-21 DIAGNOSIS — R519 Headache, unspecified: Secondary | ICD-10-CM | POA: Diagnosis not present

## 2019-12-21 DIAGNOSIS — I5032 Chronic diastolic (congestive) heart failure: Secondary | ICD-10-CM | POA: Diagnosis not present

## 2019-12-21 DIAGNOSIS — R0689 Other abnormalities of breathing: Secondary | ICD-10-CM | POA: Diagnosis not present

## 2019-12-21 DIAGNOSIS — Z794 Long term (current) use of insulin: Secondary | ICD-10-CM | POA: Insufficient documentation

## 2019-12-21 DIAGNOSIS — K5090 Crohn's disease, unspecified, without complications: Secondary | ICD-10-CM | POA: Diagnosis not present

## 2019-12-21 DIAGNOSIS — E860 Dehydration: Secondary | ICD-10-CM | POA: Insufficient documentation

## 2019-12-21 DIAGNOSIS — Z743 Need for continuous supervision: Secondary | ICD-10-CM | POA: Diagnosis not present

## 2019-12-21 DIAGNOSIS — E785 Hyperlipidemia, unspecified: Secondary | ICD-10-CM | POA: Diagnosis not present

## 2019-12-21 DIAGNOSIS — E1169 Type 2 diabetes mellitus with other specified complication: Secondary | ICD-10-CM | POA: Diagnosis not present

## 2019-12-21 DIAGNOSIS — I6523 Occlusion and stenosis of bilateral carotid arteries: Secondary | ICD-10-CM | POA: Diagnosis not present

## 2019-12-21 DIAGNOSIS — I639 Cerebral infarction, unspecified: Secondary | ICD-10-CM | POA: Diagnosis not present

## 2019-12-21 DIAGNOSIS — Z87891 Personal history of nicotine dependence: Secondary | ICD-10-CM | POA: Diagnosis not present

## 2019-12-21 DIAGNOSIS — M1612 Unilateral primary osteoarthritis, left hip: Secondary | ICD-10-CM | POA: Diagnosis not present

## 2019-12-21 DIAGNOSIS — N1832 Chronic kidney disease, stage 3b: Secondary | ICD-10-CM | POA: Diagnosis not present

## 2019-12-21 DIAGNOSIS — I2510 Atherosclerotic heart disease of native coronary artery without angina pectoris: Secondary | ICD-10-CM | POA: Diagnosis not present

## 2019-12-21 DIAGNOSIS — G8190 Hemiplegia, unspecified affecting unspecified side: Secondary | ICD-10-CM | POA: Diagnosis not present

## 2019-12-21 DIAGNOSIS — M25562 Pain in left knee: Secondary | ICD-10-CM | POA: Diagnosis not present

## 2019-12-21 DIAGNOSIS — H5462 Unqualified visual loss, left eye, normal vision right eye: Secondary | ICD-10-CM | POA: Diagnosis not present

## 2019-12-21 DIAGNOSIS — R42 Dizziness and giddiness: Secondary | ICD-10-CM | POA: Diagnosis not present

## 2019-12-21 DIAGNOSIS — I130 Hypertensive heart and chronic kidney disease with heart failure and stage 1 through stage 4 chronic kidney disease, or unspecified chronic kidney disease: Secondary | ICD-10-CM | POA: Diagnosis not present

## 2019-12-21 DIAGNOSIS — I69354 Hemiplegia and hemiparesis following cerebral infarction affecting left non-dominant side: Secondary | ICD-10-CM | POA: Diagnosis not present

## 2019-12-21 LAB — COMPREHENSIVE METABOLIC PANEL
ALT: 21 U/L (ref 0–44)
AST: 18 U/L (ref 15–41)
Albumin: 3.9 g/dL (ref 3.5–5.0)
Alkaline Phosphatase: 91 U/L (ref 38–126)
Anion gap: 10 (ref 5–15)
BUN: 39 mg/dL — ABNORMAL HIGH (ref 8–23)
CO2: 27 mmol/L (ref 22–32)
Calcium: 8.9 mg/dL (ref 8.9–10.3)
Chloride: 103 mmol/L (ref 98–111)
Creatinine, Ser: 2.1 mg/dL — ABNORMAL HIGH (ref 0.61–1.24)
GFR calc Af Amer: 34 mL/min — ABNORMAL LOW (ref >60)
GFR calc non Af Amer: 30 mL/min — ABNORMAL LOW (ref >60)
Glucose, Bld: 115 mg/dL — ABNORMAL HIGH (ref 70–99)
Potassium: 3.9 mmol/L (ref 3.5–5.1)
Sodium: 140 mmol/L (ref 135–145)
Total Bilirubin: 0.3 mg/dL (ref 0.3–1.2)
Total Protein: 6.3 g/dL — ABNORMAL LOW (ref 6.5–8.1)

## 2019-12-21 LAB — CBC WITH DIFFERENTIAL/PLATELET
Abs Immature Granulocytes: 0.02 10*3/uL (ref 0.00–0.07)
Basophils Absolute: 0.1 10*3/uL (ref 0.0–0.1)
Basophils Relative: 1 %
Eosinophils Absolute: 0.3 10*3/uL (ref 0.0–0.5)
Eosinophils Relative: 3 %
HCT: 47 % (ref 39.0–52.0)
Hemoglobin: 14.8 g/dL (ref 13.0–17.0)
Immature Granulocytes: 0 %
Lymphocytes Relative: 21 %
Lymphs Abs: 2 10*3/uL (ref 0.7–4.0)
MCH: 26.9 pg (ref 26.0–34.0)
MCHC: 31.5 g/dL (ref 30.0–36.0)
MCV: 85.3 fL (ref 80.0–100.0)
Monocytes Absolute: 1.1 10*3/uL — ABNORMAL HIGH (ref 0.1–1.0)
Monocytes Relative: 12 %
Neutro Abs: 6 10*3/uL (ref 1.7–7.7)
Neutrophils Relative %: 63 %
Platelets: 176 10*3/uL (ref 150–400)
RBC: 5.51 MIL/uL (ref 4.22–5.81)
RDW: 13.4 % (ref 11.5–15.5)
WBC: 9.4 10*3/uL (ref 4.0–10.5)
nRBC: 0 % (ref 0.0–0.2)

## 2019-12-21 NOTE — Telephone Encounter (Signed)
Pt wife calls in at 29 pm- she states that she thinks her husband (patient) has had a stroke. She states that yesterday she thinks he had a mini-stroke and that EMS was called - within about an hour, he started feeling better and he did not go to the hospital  Today, she come home from [facility] and found him in the floor this- was around 230 pm. She says that he can hardly walk due to left side weakness.  Pt was HIGHLY advised to call 911 and to do so ASAP. I explained that timing is VERY important when there is a stroke.  Pt does not want to go and is semi-argumentative with his wife and myself. I again explained the importance to go; may need CT head and follow up.  Wife was urged to get him there ASAP

## 2019-12-21 NOTE — Telephone Encounter (Signed)
Talked to [employer]

## 2019-12-21 NOTE — ED Provider Notes (Signed)
[facility] EMERGENCY DEPARTMENT Provider Note   CSN: [id number] Arrival date & time: 12/21/19  1756     History Chief Complaint  Patient presents with  . Fall    Evan Stanley is a 79 y.o. male.  Patient had a fall and was too weak to get up.  Patient is a history of stroke and now states that his weakness on his right side is back to normal.  The history is provided by the patient. No language interpreter was used.  Fall This is a new problem. The current episode started 1 to 2 hours ago. The problem occurs rarely. The problem has been resolved. Pertinent negatives include no chest pain, no abdominal pain and no headaches. Nothing aggravates the symptoms. Nothing relieves the symptoms. He has tried nothing for the symptoms.       Past Medical History:  Diagnosis Date  . AKI (acute kidney injury) ([place])   . Allergy   . Anxiety   . Arthritis    Right shoulder  . Atrial fibrillation ([place])   . Benign essential HTN   . Carotid artery stenosis   . Cataract   . CHF (congestive heart failure) ([place])   . CVA (cerebral vascular accident) ([place]) 04/28/2016  . Decreased vision    left eye  . Diabetes mellitus without complication ([place])    Takes Metformin  . Hyperlipidemia   . Hypertension   . Salmonella bacteremia 04/29/2016  . Sepsis ([place]) 04/29/2016  . Stroke ([place]) 11/18/2015   no deficits  . TIA (transient ischemic attack)    affected left eye  . Urgency of urination     Patient Active Problem List   Diagnosis Date Noted  . Posterior vitreous detachment of right eye 11/29/2019  . Branch retinal artery occlusion of left eye 11/29/2019  . Rubeosis iridis of left eye 11/29/2019  . Glaucoma associated with ocular disorder, left, severe stage 11/29/2019  . TIA (transient ischemic attack) 05/13/2019  . Coronary arteriosclerosis in native artery 12/12/2018  . Type 2 diabetes mellitus ([place]) 12/12/2018  . Right Carotid artery stenosis 11/03/2017  . Crohn's disease ([place])  04/30/2016  . Stage 3 chronic kidney disease 04/29/2016  . Late effects of cerebrovascular disease 04/03/2016  . History of aortic valve replacement 01/05/2016  . Left Carotid artery occlusion 11/18/2015  . Disorder of carotid artery ([place]) 11/18/2015  . Occlusion and stenosis of right carotid artery 10/26/2014  . Hyperlipidemia associated w/ type 2 DM goal <70 07/05/2013  . Hypertension associated with diabetes ([place]) 07/05/2013  . Hyperlipidemia, unspecified 07/05/2013    Past Surgical History:  Procedure Laterality Date  . AORTIC VALVE REPLACEMENT    . BACK SURGERY  80s  . CARDIAC VALVE REPLACEMENT  2010  . ENDARTERECTOMY Right 01/13/2016   Procedure: ENDARTERECTOMY CAROTID - RIGHT;  Surgeon: Conrad [place], MD;  Location: [facility];  Service: Vascular;  Laterality: Right;  . EYE SURGERY    . laser surgery, left eye  Left 10-29-2015    retinal surgery/ Deloria Lair MD   . PERIPHERAL VASCULAR CATHETERIZATION Right 11/18/2015   Procedure: Carotid Angiography;  Surgeon: Conrad B[place]own, MD;  Location: [place] [facility];  Service: Cardiovascular;  Laterality: Right;  . PERIPHERAL VASCULAR CATHETERIZATION N/A 11/18/2015   Procedure: Aortic Arch Angiography;  Surgeon: Conrad Somerset, MD;  Location: [place] [facility];  Service: Cardiovascular;  Laterality: N/A;  . TEE WITHOUT CARDIOVERSION N/A 05/05/2016   Procedure: TRANSESOPHAGEAL ECHOCARDIOGRAM (TEE);  Surgeon: Denice Bors  Stanford Breed, MD;  Location: [facility];  Service: Cardiovascular;  Laterality: N/A;       Family History  Problem Relation Age of Onset  . Heart disease Mother   . Cancer Mother        breast  . Stroke Father 35  . Hypertension Father   . Early death Sister        infant death  . Heart disease Brother   . Hydrocephalus Brother   . Heart disease Brother   . Diabetes Brother   . Cancer Brother        prostat  . Healthy Son   . Healthy Son     Social History   Tobacco Use  . Smoking status: Former Smoker    Packs/day:  2.00    Years: 20.00    Pack years: 40.00    Types: Cigarettes    Quit date: 07/20/1986    Years since quitting: 33.4  . Smokeless tobacco: Never Used  Substance Use Topics  . Alcohol use: No    Alcohol/week: 0.0 standard drinks  . Drug use: No    Home Medications Prior to Admission medications   Medication Sig Start Date End Date Taking? Authorizing Provider  amLODipine (NORVASC) 10 MG tablet TAKE 1 TABLET BY MOUTH  DAILY 11/07/19   [name], DO  aspirin EC 81 MG tablet Take 1 tablet (81 mg total) by mouth daily. 08/16/15   Belva Crome, MD  atorvastatin (LIPITOR) 40 MG tablet TAKE 1 TABLET BY MOUTH  DAILY 11/07/19   [name], DO  cetirizine (ZYRTEC) 10 MG tablet Take 10 mg by mouth daily.    [provider]  Dulaglutide (TRULICITY) 1.5 JT/7.0VX SOPN Inject 1.5 mg into the skin once a week. 11/06/19   [name], MD  hydrochlorothiazide (HYDRODIURIL) 25 MG tablet Take 1 tablet (25 mg total) by mouth daily. 10/02/19   [name], DO  insulin degludec (TRESIBA FLEXTOUCH) 200 UNIT/ML FlexTouch Pen Inject 50 Units into the skin daily.    [provider]  insulin lispro (HUMALOG) 100 UNIT/ML injection Inject 4-6 Units into the skin once.    [provider]  Lancets Glory Rosebush ULTRASOFT) lancets Test BS BID Dx E11.9 09/11/19   [name], DO  latanoprost (XALATAN) 0.005 % ophthalmic solution Place 1 drop into the left eye at bedtime.     [provider]  lisinopril (ZESTRIL) 40 MG tablet Take 1 tablet (40 mg total) by mouth daily. 10/02/19   [name], DO  metoprolol succinate (TOPROL-XL) 100 MG 24 hr tablet Take with or immediately following a meal. 10/02/19   Gottschalk, Ashly M, DO  ONETOUCH ULTRA test strip USE TO CHECK BLOOD SUGAR  TWICE DAILY 06/05/19   [name], DO  vitamin C (ASCORBIC ACID) 500 MG tablet Take 500 mg by mouth daily.    [provider]  vitamin E (VITAMIN E) 1000 UNIT  capsule Take 1,000 Units by mouth daily.    [provider]    Allergies    Patient has no known allergies.  Review of Systems   Review of Systems  Constitutional: Negative for appetite change and fatigue.  HENT: Negative for congestion, ear discharge and sinus pressure.   Eyes: Negative for discharge.  Respiratory: Negative for cough.   Cardiovascular: Negative for chest pain.  Gastrointestinal: Negative for abdominal pain and diarrhea.  Genitourinary: Negative for frequency and hematuria.  Musculoskeletal: Negative for back pain.  Mild weakness right arm and leg  Skin: Negative for rash.  Neurological: Negative for seizures and headaches.  Psychiatric/Behavioral: Negative for hallucinations.    Physical Exam Updated Vital Signs BP (!) 148/67   Pulse 66   Temp 97.7 F (36.5 C) (Oral)   Resp 17   Ht 5' 5"  (1.651 m)   Wt 73.9 kg   SpO2 99%   BMI 27.12 kg/m   Physical Exam Vitals reviewed.  Constitutional:      Appearance: He is well-developed.  HENT:     Head: Normocephalic.     Nose: Nose normal.  Eyes:     General: No scleral icterus.    Conjunctiva/sclera: Conjunctivae normal.  Neck:     Thyroid: No thyromegaly.  Cardiovascular:     Rate and Rhythm: Normal rate and regular rhythm.     Heart sounds: No murmur. No friction rub. No gallop.   Pulmonary:     Breath sounds: No stridor. No wheezing or rales.  Chest:     Chest wall: No tenderness.  Abdominal:     General: There is no distension.     Tenderness: There is no abdominal tenderness. There is no rebound.  Musculoskeletal:        General: Normal range of motion.     Cervical back: Neck supple.     Comments: Weakness to right arm and leg  Lymphadenopathy:     Cervical: No cervical adenopathy.  Skin:    Findings: No erythema or rash.  Neurological:     Mental Status: He is alert and oriented to person, place, and time.     Motor: No abnormal muscle tone.     Coordination:  Coordination normal.  Psychiatric:        Behavior: Behavior normal.     ED Results / Procedures / Treatments   Labs (all labs ordered are listed, but only abnormal results are displayed) Labs Reviewed  CBC WITH DIFFERENTIAL/PLATELET - Abnormal; Notable for the following components:      Result Value   Monocytes Absolute 1.1 (*)    All other components within normal limits  COMPREHENSIVE METABOLIC PANEL - Abnormal; Notable for the following components:   Glucose, Bld 115 (*)    BUN 39 (*)    Creatinine, Ser 2.10 (*)    Total Protein 6.3 (*)    GFR calc non Af Amer 30 (*)    GFR calc Af Amer 34 (*)    All other components within normal limits    EKG EKG Interpretation  Date/Time:  Thursday December 21 2019 19:21:03 EDT Ventricular Rate:  64 PR Interval:    QRS Duration: 105 QT Interval:  411 QTC Calculation: 424 R Axis:   82 Text Interpretation: Sinus rhythm Borderline right axis deviation Borderline repolarization abnormality Confirmed by Milton Ferguson [phone]) on 12/21/2019 10:12:09 PM   Radiology CT Head Wo Contrast  Result Date: 12/21/2019 CLINICAL DATA:  79 year old male with head trauma. Dizziness. EXAM: CT HEAD WITHOUT CONTRAST TECHNIQUE: Contiguous axial images were obtained from the base of the skull through the vertex without intravenous contrast. COMPARISON:  Head CT dated 05/13/2019. FINDINGS: Brain: Moderate age-related atrophy and chronic microvascular ischemic changes. There is no acute intracranial hemorrhage. No mass effect or midline shift. No extra-axial fluid collection. Vascular: No hyperdense vessel or unexpected calcification. Skull: Normal. Negative for fracture or focal lesion. Sinuses/Orbits: No acute finding. Other: None IMPRESSION: 1. No acute intracranial pathology. 2. Moderate age-related atrophy and chronic microvascular ischemic changes. Electronically  Signed   By: Anner Crete M.D.   On: 12/21/2019 19:43    Procedures Procedures (including  critical care time)  Medications Ordered in ED Medications - No data to display  ED Course  I have reviewed the triage vital signs and the nursing notes.  Pertinent labs & imaging results that were available during my care of the patient were reviewed by me and considered in my medical decision making (see chart for details).    MDM Rules/Calculators/A&P                      Patient with fall and dehydration.  Patient did not want an IV and fluid bolus so he is going to drink about 1 L of fluids when he gets home.  Patient ambulated without problems.        This patient presents to the ED for concern of weakness this involves an extensive number of treatment options, and is a complaint that carries with it a high risk of complications and morbidity.  The differential diagnosis includes CVA   Lab Tests:   I Ordered, reviewed, and interpreted labs, which included CBC chemistries that showed creatinine  Medicines ordered:   I ordered medication IV fluids for dehydration patient refused  Imaging Studies ordered:   I ordered imaging studies which included CT head and  I independently visualized and interpreted imaging which showed no acute disease  Additional history obtained:   Additional history obtained from spouse  Previous records obtained and reviewed  Consultations Obtained:   Reevaluation:  After the interventions stated above, I reevaluated the patient and found improved  Critical Interventions:  .   Final Clinical Impression(s) / ED Diagnoses Final diagnoses:  Fall, initial encounter  Dehydration    Rx / DC Orders ED Discharge Orders    None       Milton Ferguson, MD 12/25/19 1143

## 2019-12-21 NOTE — ED Triage Notes (Signed)
Pt fell yesterday and today around lunchtime. He has had a previous stroke 3 years ago. Left sided weakness which is normal. Pt was unable to get up from the floor due to left sided weakness. Pt's PCP believes he is having mini strokes. Pt admits getting dizzy upon standing. The previous stroke was behind his left eye.

## 2019-12-21 NOTE — Discharge Instructions (Addendum)
Drink the equivalent of 3 cans of Coke tonight.  Do not drink caffeinated drinks.  Follow-up with your doctor next week for recheck

## 2019-12-22 ENCOUNTER — Telehealth: Payer: Self-pay | Admitting: Family Medicine

## 2019-12-22 NOTE — Telephone Encounter (Signed)
Appointment scheduled.

## 2019-12-23 DIAGNOSIS — M1712 Unilateral primary osteoarthritis, left knee: Secondary | ICD-10-CM | POA: Diagnosis not present

## 2019-12-23 DIAGNOSIS — M5136 Other intervertebral disc degeneration, lumbar region: Secondary | ICD-10-CM | POA: Diagnosis not present

## 2019-12-23 DIAGNOSIS — M1612 Unilateral primary osteoarthritis, left hip: Secondary | ICD-10-CM | POA: Diagnosis not present

## 2019-12-23 DIAGNOSIS — M25562 Pain in left knee: Secondary | ICD-10-CM | POA: Diagnosis not present

## 2019-12-24 ENCOUNTER — Emergency Department (HOSPITAL_COMMUNITY): Payer: Medicare Other

## 2019-12-24 ENCOUNTER — Inpatient Hospital Stay (HOSPITAL_COMMUNITY)
Admission: EM | Admit: 2019-12-24 | Discharge: 2019-12-27 | DRG: 065 | Disposition: A | Discharge status: Home Health | Payer: Medicare Other | Attending: Family Medicine | Admitting: Family Medicine

## 2019-12-24 ENCOUNTER — Inpatient Hospital Stay (HOSPITAL_COMMUNITY): Payer: Medicare Other

## 2019-12-24 ENCOUNTER — Encounter (HOSPITAL_COMMUNITY): Payer: Self-pay | Admitting: Emergency Medicine

## 2019-12-24 ENCOUNTER — Other Ambulatory Visit: Payer: Self-pay

## 2019-12-24 DIAGNOSIS — Z8249 Family history of ischemic heart disease and other diseases of the circulatory system: Secondary | ICD-10-CM

## 2019-12-24 DIAGNOSIS — Z952 Presence of prosthetic heart valve: Secondary | ICD-10-CM | POA: Diagnosis not present

## 2019-12-24 DIAGNOSIS — Z87891 Personal history of nicotine dependence: Secondary | ICD-10-CM

## 2019-12-24 DIAGNOSIS — I63421 Cerebral infarction due to embolism of right anterior cerebral artery: Principal | ICD-10-CM | POA: Diagnosis present

## 2019-12-24 DIAGNOSIS — E1121 Type 2 diabetes mellitus with diabetic nephropathy: Secondary | ICD-10-CM | POA: Diagnosis not present

## 2019-12-24 DIAGNOSIS — Z7982 Long term (current) use of aspirin: Secondary | ICD-10-CM

## 2019-12-24 DIAGNOSIS — R42 Dizziness and giddiness: Secondary | ICD-10-CM | POA: Diagnosis not present

## 2019-12-24 DIAGNOSIS — R269 Unspecified abnormalities of gait and mobility: Secondary | ICD-10-CM | POA: Diagnosis not present

## 2019-12-24 DIAGNOSIS — E1122 Type 2 diabetes mellitus with diabetic chronic kidney disease: Secondary | ICD-10-CM | POA: Diagnosis present

## 2019-12-24 DIAGNOSIS — E1169 Type 2 diabetes mellitus with other specified complication: Secondary | ICD-10-CM | POA: Diagnosis present

## 2019-12-24 DIAGNOSIS — E860 Dehydration: Secondary | ICD-10-CM | POA: Diagnosis present

## 2019-12-24 DIAGNOSIS — K5090 Crohn's disease, unspecified, without complications: Secondary | ICD-10-CM | POA: Diagnosis present

## 2019-12-24 DIAGNOSIS — I5032 Chronic diastolic (congestive) heart failure: Secondary | ICD-10-CM | POA: Diagnosis present

## 2019-12-24 DIAGNOSIS — I6521 Occlusion and stenosis of right carotid artery: Secondary | ICD-10-CM | POA: Diagnosis not present

## 2019-12-24 DIAGNOSIS — I63531 Cerebral infarction due to unspecified occlusion or stenosis of right posterior cerebral artery: Secondary | ICD-10-CM | POA: Diagnosis not present

## 2019-12-24 DIAGNOSIS — Z951 Presence of aortocoronary bypass graft: Secondary | ICD-10-CM

## 2019-12-24 DIAGNOSIS — E1159 Type 2 diabetes mellitus with other circulatory complications: Secondary | ICD-10-CM | POA: Diagnosis present

## 2019-12-24 DIAGNOSIS — I6522 Occlusion and stenosis of left carotid artery: Secondary | ICD-10-CM | POA: Diagnosis not present

## 2019-12-24 DIAGNOSIS — W19XXXA Unspecified fall, initial encounter: Secondary | ICD-10-CM | POA: Diagnosis present

## 2019-12-24 DIAGNOSIS — Z953 Presence of xenogenic heart valve: Secondary | ICD-10-CM

## 2019-12-24 DIAGNOSIS — I130 Hypertensive heart and chronic kidney disease with heart failure and stage 1 through stage 4 chronic kidney disease, or unspecified chronic kidney disease: Secondary | ICD-10-CM | POA: Diagnosis present

## 2019-12-24 DIAGNOSIS — R519 Headache, unspecified: Secondary | ICD-10-CM | POA: Diagnosis not present

## 2019-12-24 DIAGNOSIS — E1165 Type 2 diabetes mellitus with hyperglycemia: Secondary | ICD-10-CM | POA: Diagnosis present

## 2019-12-24 DIAGNOSIS — N1832 Chronic kidney disease, stage 3b: Secondary | ICD-10-CM

## 2019-12-24 DIAGNOSIS — I639 Cerebral infarction, unspecified: Secondary | ICD-10-CM | POA: Diagnosis not present

## 2019-12-24 DIAGNOSIS — R296 Repeated falls: Secondary | ICD-10-CM | POA: Diagnosis present

## 2019-12-24 DIAGNOSIS — R29700 NIHSS score 0: Secondary | ICD-10-CM | POA: Diagnosis present

## 2019-12-24 DIAGNOSIS — I2510 Atherosclerotic heart disease of native coronary artery without angina pectoris: Secondary | ICD-10-CM | POA: Diagnosis present

## 2019-12-24 DIAGNOSIS — Z79899 Other long term (current) drug therapy: Secondary | ICD-10-CM

## 2019-12-24 DIAGNOSIS — E1151 Type 2 diabetes mellitus with diabetic peripheral angiopathy without gangrene: Secondary | ICD-10-CM | POA: Diagnosis present

## 2019-12-24 DIAGNOSIS — F419 Anxiety disorder, unspecified: Secondary | ICD-10-CM | POA: Diagnosis present

## 2019-12-24 DIAGNOSIS — I69398 Other sequelae of cerebral infarction: Secondary | ICD-10-CM | POA: Diagnosis not present

## 2019-12-24 DIAGNOSIS — M1712 Unilateral primary osteoarthritis, left knee: Secondary | ICD-10-CM | POA: Diagnosis present

## 2019-12-24 DIAGNOSIS — I480 Paroxysmal atrial fibrillation: Secondary | ICD-10-CM | POA: Diagnosis present

## 2019-12-24 DIAGNOSIS — M25462 Effusion, left knee: Secondary | ICD-10-CM | POA: Diagnosis not present

## 2019-12-24 DIAGNOSIS — I63233 Cerebral infarction due to unspecified occlusion or stenosis of bilateral carotid arteries: Secondary | ICD-10-CM | POA: Diagnosis not present

## 2019-12-24 DIAGNOSIS — I69354 Hemiplegia and hemiparesis following cerebral infarction affecting left non-dominant side: Secondary | ICD-10-CM | POA: Diagnosis not present

## 2019-12-24 DIAGNOSIS — E119 Type 2 diabetes mellitus without complications: Secondary | ICD-10-CM

## 2019-12-24 DIAGNOSIS — I6523 Occlusion and stenosis of bilateral carotid arteries: Secondary | ICD-10-CM | POA: Diagnosis present

## 2019-12-24 DIAGNOSIS — S0990XA Unspecified injury of head, initial encounter: Secondary | ICD-10-CM | POA: Diagnosis not present

## 2019-12-24 DIAGNOSIS — I779 Disorder of arteries and arterioles, unspecified: Secondary | ICD-10-CM | POA: Diagnosis present

## 2019-12-24 DIAGNOSIS — H5462 Unqualified visual loss, left eye, normal vision right eye: Secondary | ICD-10-CM | POA: Diagnosis present

## 2019-12-24 DIAGNOSIS — Z794 Long term (current) use of insulin: Secondary | ICD-10-CM

## 2019-12-24 DIAGNOSIS — N183 Chronic kidney disease, stage 3 unspecified: Secondary | ICD-10-CM | POA: Diagnosis present

## 2019-12-24 DIAGNOSIS — Z20822 Contact with and (suspected) exposure to covid-19: Secondary | ICD-10-CM | POA: Diagnosis present

## 2019-12-24 DIAGNOSIS — I6389 Other cerebral infarction: Secondary | ICD-10-CM | POA: Diagnosis not present

## 2019-12-24 DIAGNOSIS — E785 Hyperlipidemia, unspecified: Secondary | ICD-10-CM | POA: Diagnosis present

## 2019-12-24 LAB — I-STAT CHEM 8, ED
BUN: 34 mg/dL — ABNORMAL HIGH (ref 8–23)
Calcium, Ion: 1.28 mmol/L (ref 1.15–1.40)
Chloride: 98 mmol/L (ref 98–111)
Creatinine, Ser: 1.9 mg/dL — ABNORMAL HIGH (ref 0.61–1.24)
Glucose, Bld: 177 mg/dL — ABNORMAL HIGH (ref 70–99)
HCT: 48 % (ref 39.0–52.0)
Hemoglobin: 16.3 g/dL (ref 13.0–17.0)
Potassium: 3.9 mmol/L (ref 3.5–5.1)
Sodium: 137 mmol/L (ref 135–145)
TCO2: 25 mmol/L (ref 22–32)

## 2019-12-24 LAB — DIFFERENTIAL
Abs Immature Granulocytes: 0.03 10*3/uL (ref 0.00–0.07)
Basophils Absolute: 0 10*3/uL (ref 0.0–0.1)
Basophils Relative: 0 %
Eosinophils Absolute: 0.1 10*3/uL (ref 0.0–0.5)
Eosinophils Relative: 1 %
Immature Granulocytes: 0 %
Lymphocytes Relative: 13 %
Lymphs Abs: 1.3 10*3/uL (ref 0.7–4.0)
Monocytes Absolute: 1 10*3/uL (ref 0.1–1.0)
Monocytes Relative: 11 %
Neutro Abs: 7 10*3/uL (ref 1.7–7.7)
Neutrophils Relative %: 75 %

## 2019-12-24 LAB — COMPREHENSIVE METABOLIC PANEL
ALT: 18 U/L (ref 0–44)
AST: 18 U/L (ref 15–41)
Albumin: 3.6 g/dL (ref 3.5–5.0)
Alkaline Phosphatase: 80 U/L (ref 38–126)
Anion gap: 8 (ref 5–15)
BUN: 33 mg/dL — ABNORMAL HIGH (ref 8–23)
CO2: 25 mmol/L (ref 22–32)
Calcium: 9.2 mg/dL (ref 8.9–10.3)
Chloride: 101 mmol/L (ref 98–111)
Creatinine, Ser: 1.85 mg/dL — ABNORMAL HIGH (ref 0.61–1.24)
GFR calc Af Amer: 40 mL/min — ABNORMAL LOW (ref >60)
GFR calc non Af Amer: 35 mL/min — ABNORMAL LOW (ref >60)
Glucose, Bld: 179 mg/dL — ABNORMAL HIGH (ref 70–99)
Potassium: 3.9 mmol/L (ref 3.5–5.1)
Sodium: 134 mmol/L — ABNORMAL LOW (ref 135–145)
Total Bilirubin: 1 mg/dL (ref 0.3–1.2)
Total Protein: 6.7 g/dL (ref 6.5–8.1)

## 2019-12-24 LAB — URINALYSIS, ROUTINE W REFLEX MICROSCOPIC
Bilirubin Urine: NEGATIVE
Glucose, UA: NEGATIVE mg/dL
Hgb urine dipstick: NEGATIVE
Ketones, ur: NEGATIVE mg/dL
Leukocytes,Ua: NEGATIVE
Nitrite: NEGATIVE
Protein, ur: NEGATIVE mg/dL
Specific Gravity, Urine: 1.013 (ref 1.005–1.030)
pH: 5 (ref 5.0–8.0)

## 2019-12-24 LAB — CBC
HCT: 48.5 % (ref 39.0–52.0)
Hemoglobin: 15.5 g/dL (ref 13.0–17.0)
MCH: 26.5 pg (ref 26.0–34.0)
MCHC: 32 g/dL (ref 30.0–36.0)
MCV: 82.8 fL (ref 80.0–100.0)
Platelets: 207 10*3/uL (ref 150–400)
RBC: 5.86 MIL/uL — ABNORMAL HIGH (ref 4.22–5.81)
RDW: 13.4 % (ref 11.5–15.5)
WBC: 9.5 10*3/uL (ref 4.0–10.5)
nRBC: 0 % (ref 0.0–0.2)

## 2019-12-24 LAB — PROTIME-INR
INR: 1.1 (ref 0.8–1.2)
Prothrombin Time: 13.4 seconds (ref 11.4–15.2)

## 2019-12-24 LAB — SARS CORONAVIRUS 2 BY RT PCR (HOSPITAL ORDER, PERFORMED IN ~~LOC~~ HOSPITAL LAB): SARS Coronavirus 2: NEGATIVE

## 2019-12-24 LAB — CBG MONITORING, ED: Glucose-Capillary: 162 mg/dL — ABNORMAL HIGH (ref 70–99)

## 2019-12-24 LAB — APTT: aPTT: 29 seconds (ref 24–36)

## 2019-12-24 MED ORDER — ACETAMINOPHEN 160 MG/5ML PO SOLN: 650.0000 mg | ORAL | Status: DC | PRN

## 2019-12-24 MED ORDER — ASPIRIN EC 81 MG PO TBEC
81.0000 mg | DELAYED_RELEASE_TABLET | Freq: Every day | ORAL | Status: DC
  Administered 2019-12-25 – 2019-12-26 (×2): 81 mg via ORAL
  Filled 2019-12-24 (×2): qty 1

## 2019-12-24 MED ORDER — ATORVASTATIN CALCIUM 40 MG PO TABS
40.0000 mg | ORAL_TABLET | Freq: Every day | ORAL | Status: DC
  Administered 2019-12-25 – 2019-12-26 (×2): 40 mg via ORAL
  Filled 2019-12-24 (×3): qty 1

## 2019-12-24 MED ORDER — SODIUM CHLORIDE 0.9% FLUSH
3.0000 mL | Freq: Once | INTRAVENOUS | Status: AC
  Administered 2019-12-24: 3 mL via INTRAVENOUS

## 2019-12-24 MED ORDER — SENNOSIDES-DOCUSATE SODIUM 8.6-50 MG PO TABS: 1.0000 | ORAL_TABLET | Freq: Every evening | ORAL | Status: DC | PRN

## 2019-12-24 MED ORDER — STROKE: EARLY STAGES OF RECOVERY BOOK: Freq: Once | Status: DC

## 2019-12-24 MED ORDER — ACETAMINOPHEN 650 MG RE SUPP: 650.0000 mg | RECTAL | Status: DC | PRN

## 2019-12-24 MED ORDER — MECLIZINE HCL 25 MG PO TABS
25.0000 mg | ORAL_TABLET | Freq: Once | ORAL | Status: DC
  Filled 2019-12-24: qty 1

## 2019-12-24 MED ORDER — ACETAMINOPHEN 325 MG PO TABS: 650.0000 mg | ORAL_TABLET | ORAL | Status: DC | PRN

## 2019-12-24 MED ORDER — ENOXAPARIN SODIUM 40 MG/0.4ML ~~LOC~~ SOLN
40.0000 mg | SUBCUTANEOUS | Status: DC
  Administered 2019-12-25: 40 mg via SUBCUTANEOUS
  Filled 2019-12-24 (×2): qty 0.4

## 2019-12-24 MED ORDER — GADOBUTROL 1 MMOL/ML IV SOLN
7.5000 mL | Freq: Once | INTRAVENOUS | Status: AC | PRN
  Administered 2019-12-24: 7.5 mL via INTRAVENOUS

## 2019-12-24 NOTE — Consult Note (Signed)
Neurology Consultation  Reason for Consult: stroke Referring Physician: Dr Ileene Musa [facility]  CC: falls  History is obtained from patient, chart review  HPI: Evan Stanley is a 79 y.o. male past medical history of right carotid dissection with small strokes affecting the right hemisphere without any residual deficits, known atrial fibrillation not on anticoagulation, chronically occluded left ICA, diabetes, hypertension, hyperlipidemia, blindness in the left eye, CAD status post CABG with AVR, peripheral vascular disease-with a note from cardiologist saying that low-dose Xarelto has been recommended but I do not see on his list of medications-presented to the emergency room for evaluation of falls and right knee giving out: Was noted to have strokes on MRI. Patient reports that his last known normal was sometime Wednesday 6-20 21 when he had a fall, he has had frequent falls since then.  He feels that he is off balance and is leaning backwards.  Also reports of foggy thinking.  For the past 2 days, his gait was altered to the point where he had to use a walker to walk. Per the chart review, the wife describes him as a pretty active person who is able to walk independently, mows his lawn, plays golf and this was concerning because of how different this was from his baseline. He had intermittent left-sided weakness since this past stroke but other than that has been pretty good in terms of his functional status according to family. He went to urgent care yesterday due to knee pain and he was discharged pending results and he was told he would be called with results. When he was examined by the ED providers, they noted significant gait instability and ordered an MRI of the brain.  The MRI showed multiple punctate acute cortical/subcortical infarcts within the posterior right frontal and right parietal lobe. There are chronic multiple lacunar infarcts seen.  Chronic small vessel disease which is mild.   Stable advanced generalized parenchymal atrophy. Signal abnormality consistent with known chronic V4 vertebral artery occlusion redemonstrated, distal cervical left ICA and left ICA siphon signals were also abnormal unchanged from before.   Neurological consultation was obtained for the stroke work-up   LKW: Many days ago tpa given?: no, clearly outside the window Premorbid modified Rankin scale (mRS): 1   ROS: Performed and negative except documented in HPI. Past Medical History:  Diagnosis Date  . AKI (acute kidney injury) ([place])   . Allergy   . Anxiety   . Arthritis    Right shoulder  . Atrial fibrillation ([place])   . Benign essential HTN   . Carotid artery stenosis   . Cataract   . CHF (congestive heart failure) ([place])   . CVA (cerebral vascular accident) ([place]) 04/28/2016  . Decreased vision    left eye  . Diabetes mellitus without complication ([place])    Takes Metformin  . Hyperlipidemia   . Hypertension   . Salmonella bacteremia 04/29/2016  . Sepsis ([place]) 04/29/2016  . Stroke ([place]) 11/18/2015   no deficits  . TIA (transient ischemic attack)    affected left eye  . Urgency of urination     Family History  Problem Relation Age of Onset  . Heart disease Mother   . Cancer Mother        breast  . Stroke Father 60  . Hypertension Father   . Early death Sister        infant death  . Heart disease Brother   . Hydrocephalus Brother   . Heart  disease Brother   . Diabetes Brother   . Cancer Brother        prostat  . Healthy Son   . Healthy Son     Social History:   reports that he quit smoking about 33 years ago. His smoking use included cigarettes. He has a 40.00 pack-year smoking history. He has never used smokeless tobacco. He reports that he does not drink alcohol or use drugs. Medications  Current Facility-Administered Medications:  .   stroke: mapping our early stages of recovery book, , Does not apply, Once, Tu, Ching T, DO .  acetaminophen (TYLENOL) tablet  650 mg, 650 mg, Oral, Q4H PRN **OR** acetaminophen (TYLENOL) 160 MG/5ML solution 650 mg, 650 mg, Per Tube, Q4H PRN **OR** acetaminophen (TYLENOL) suppository 650 mg, 650 mg, Rectal, Q4H PRN, Tu, Ching T, DO .  [START ON 12/25/2019] aspirin EC tablet 81 mg, 81 mg, Oral, Daily, Tu, Ching T, DO .  atorvastatin (LIPITOR) tablet 40 mg, 40 mg, Oral, QHS, Tu, Ching T, DO .  enoxaparin (LOVENOX) injection 40 mg, 40 mg, Subcutaneous, Q24H, Tu, Ching T, DO .  senna-docusate (Senokot-S) tablet 1 tablet, 1 tablet, Oral, QHS PRN, Tu, Ching T, DO  Exam: Current vital signs: BP (!) 143/71 (BP Location: Right Arm)   Pulse 85   Temp 98.4 F (36.9 C) (Oral)   Resp 18   Ht 5' 5"  (1.651 m)   Wt 75.1 kg   SpO2 100%   BMI 27.55 kg/m  Vital signs in last 24 hours: Temp:  [98.4 F (36.9 C)-98.5 F (36.9 C)] 98.4 F (36.9 C) (06/06 2218) Pulse Rate:  [61-86] 85 (06/06 2218) Resp:  [15-23] 18 (06/06 2218) BP: (103-144)/(54-93) 143/71 (06/06 2218) SpO2:  [97 %-100 %] 100 % (06/06 2218) Weight:  [73.9 kg-75.1 kg] 75.1 kg (06/06 2218) GENERAL: Awake, alert in NAD HEENT: - Normocephalic and atraumatic, dry mm, no LN++, no Thyromegally LUNGS - Clear to auscultation bilaterally with no wheezes CV - S1S2 RRR, no m/r/g, equal pulses bilaterally. ABDOMEN - Soft, nontender, nondistended with normoactive BS Ext: warm, well perfused, intact peripheral pulses, no edema  NEURO:  Awake alert oriented x3 No evidence of dysarthria No evidence of aphasia Pupils equal round react light, extraocular movements intact, visual fields full, facial sensation intact, face symmetric, tongue and palate midline. Motor exam: 5/5 in all fours. Sensory exam: Intact to light touch all over without extinction Coordination: Intact finger-nose-finger and heel-knee-shin testing but according to ER report, was unsteady on his feet.  NIH stroke scale-0  Labs I have reviewed labs in epic and the results pertinent to this consultation  are:  CBC    Component Value Date/Time   WBC 9.5 12/24/2019 1350   RBC 5.86 (H) 12/24/2019 1350   HGB 16.3 12/24/2019 1424   HGB 12.8 (L) 08/23/2017 0850   HCT 48.0 12/24/2019 1424   HCT 39.0 08/23/2017 0850   PLT 207 12/24/2019 1350   PLT 186 08/23/2017 0850   MCV 82.8 12/24/2019 1350   MCV 82 08/23/2017 0850   MCH 26.5 12/24/2019 1350   MCHC 32.0 12/24/2019 1350   RDW 13.4 12/24/2019 1350   RDW 13.6 08/23/2017 0850   LYMPHSABS 1.3 12/24/2019 1350   LYMPHSABS 2.2 08/23/2017 0850   MONOABS 1.0 12/24/2019 1350   EOSABS 0.1 12/24/2019 1350   EOSABS 0.3 08/23/2017 0850   BASOSABS 0.0 12/24/2019 1350   BASOSABS 0.0 08/23/2017 0850    CMP     Component Value Date/Time  NA 137 12/24/2019 1424   NA 141 04/03/2019 1138   K 3.9 12/24/2019 1424   CL 98 12/24/2019 1424   CO2 25 12/24/2019 1350   GLUCOSE 177 (H) 12/24/2019 1424   BUN 34 (H) 12/24/2019 1424   BUN 29 (H) 04/03/2019 1138   CREATININE 1.90 (H) 12/24/2019 1424   CALCIUM 9.2 12/24/2019 1350   PROT 6.7 12/24/2019 1350   PROT 6.1 04/03/2019 1138   ALBUMIN 3.6 12/24/2019 1350   ALBUMIN 4.0 04/03/2019 1138   AST 18 12/24/2019 1350   ALT 18 12/24/2019 1350   ALKPHOS 80 12/24/2019 1350   BILITOT 1.0 12/24/2019 1350   BILITOT 0.3 04/03/2019 1138   GFRNONAA 35 (L) 12/24/2019 1350   GFRAA 40 (L) 12/24/2019 1350    Lipid Panel     Component Value Date/Time   CHOL 103 05/14/2019 0413   CHOL 100 08/23/2017 0850   TRIG 100 05/14/2019 0413   HDL 27 (L) 05/14/2019 0413   HDL 32 (L) 08/23/2017 0850   CHOLHDL 3.8 05/14/2019 0413   VLDL 20 05/14/2019 0413   LDLCALC 56 05/14/2019 0413   LDLCALC 49 08/23/2017 0850   LDLDIRECT 62 02/16/2017 1151     Imaging I have reviewed the images obtained:  CT-scan of the brain-no acute changes  MR brain IMPRESSION: Several tiny acute cortical/subcortical infarcts within the posterior right frontal lobe and right parietal lobe.  As before, there are multiple chronic  lacunar infarcts within the bilateral cerebral white matter. Otherwise mild chronic small vessel ischemic disease.  Stable, advanced generalized parenchymal atrophy.  Signal abnormality consistent with known chronic occlusion of the V4 right vertebral artery, distal cervical left ICA and left ICA siphon. As before, there is reconstitution of the expected flow void within the paraclinoid left ICA.  Mild ethmoid sinus mucosal thickening.   MRA neck IMPRESSION: 1. No acute vascular abnormality within the neck. 2. Chronic left ICA occlusion. 3. Chronic distal right V4 segment occlusion. 4. Chronic severe stenosis at the origin of the right ECA, with moderate to severe stenosis at the origin of the left ECA. 5. Moderate distal left subclavian artery stenosis.    Assessment: 79 year old man with above past medical history that has significant cerebrovascular and cardiovascular risk factors presenting for evaluation of gait difficulty and dizziness/lightheadedness noted to have scattered infarcts in the right cerebral hemisphere. He has a known history of right ICA dissection and a chronically occluded left ICA. He had MRI of the brain and MRA of the neck-no head vessel imaging is available. I think he needs further work-up from a stroke perspective including vessel imaging of the head-I would do a CTA head and neck to complete the imaging but he has derangement function. Also has atrial fibrillation-not on anticoagulation.  Impression: Scattered embolic-looking right cerebral hemispheric infarct-atheroembolic  versus cardioembolic  Recommendations: Admit to hospitalist Frequent neurochecks Telemetry CTA head and neck would have been ideal but has deranged renal function.  MRI brain, MRA neck has been completed.  We will add MRA head. 2D echo A1c Lipid panel Continue aspirin for now Longer term-will need anticoagulation from a stroke prevention standpoint given atrial  fibrillation and strokes. PT OT Speech therapy Permissive hypertension-symptoms has been ongoing for more than 3 days.  No need for permissive hypertension.  -- Amie Portland, MD [facility] Pager: [phone] If 7pm to 7am, please call on call as listed on AMION.

## 2019-12-24 NOTE — ED Triage Notes (Signed)
Pt states he is unsteady on his feet/ "off balance" since Wednesday.  Reports intermittent dizziness.  Also reports L knee pain.  Pt states he fell Wednesday, Thursday, and Friday.  Denies hitting head.  Denies LOC.  No blood thinners.  No arm drift.  Reports L leg weakness on Friday that caused him to fall.

## 2019-12-24 NOTE — ED Notes (Signed)
Pt transported to MRI 

## 2019-12-24 NOTE — H&P (Addendum)
History and Physical    Evan Stanley HUO:[id number] DOB: 29-Apr-1941 DOA: 12/24/2019  PCP: [name], DO  Patient coming from: Home, wife at bedside  I have personally briefly reviewed patient's old medical records in [place]  Chief Complaint: recurrent falls  HPI: Evan Stanley is a 79 y.o. 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  About 3 days ago was walking to get the phone when he suddenly fell and had difficulty getting up.  It took about an hour for him and his wife to get back on the couch.  This again occurred 2 days ago.  Patient denies any dizziness lightheadedness or extremity weakness with either 1 of these episodes.  No chest pains or palpitation.  Then yesterday he again fell since he had hurt his left knee after the previous falls and felt weakness to his knee and fell on the deck. He was evaluated at [facility] and had negative CT scan and was discharged home.  However today he is had a lot of difficulty with ambulating and decided to present to the ED.  Patient normally walks without assistance and is a [employer] cabin crew.  He denies any headaches, vision changes, chest pain or palpitation.  Reportedly states that about 50 years ago he was told he had atrial fibrillation but no one is mention it since.  Patient has significant peripheral artery disease with history of right carotid enterectomy and chronic left ICA and severe right ECA stenosis.  He currently only takes aspirin for his previous stroke.  Reportedly recent recommendation of low-dose Xarelto was made by his interventional radiologist for PAD and CAD but no actual medication changes were made.  ED Course: He was afebrile,  normotensive on room air. No leukocytosis or anemia.  Creatinine of 1.85 which is improved from prior of 2.10.  Glucose 179.  CT head negative negative but MRI brain showed several tiny acute cortical/subcortical infarcts within the posterior right frontal lobe and right parietal lobe.  Negative left knee x-ray.  ED PA has consulted neurology who will evaluate him in the ED and requested admission by hospitalist team for further stroke work-up.  Review of Systems: Constitutional: No Weight Change, No Fever ENT/Mouth: No sore throat, No Rhinorrhea Eyes: No Eye Pain, No Vision Changes Cardiovascular: No Chest Pain, no SOB, No Palpitations Respiratory: No Cough, No Sputum, No Wheezing, no Dyspnea  Gastrointestinal: No Nausea, No Vomiting, No Diarrhea, No Constipation, No Pain Genitourinary: no Urinary Incontinence, No Urgency, No Flank Pain Musculoskeletal: No Arthralgias, No Myalgias Skin: No Skin Lesions, No Pruritus, Neuro: no Weakness, No Numbness,  No Loss of Consciousness, No Syncope Psych: No Anxiety/Panic, No Depression, no decrease appetite Heme/Lymph: No Bruising, No Bleeding  Past Medical History:  Diagnosis Date  . AKI (acute kidney injury) ([place])   . Allergy   . Anxiety   . Arthritis    Right shoulder  . Atrial fibrillation ([place])   . Benign essential HTN   . Carotid artery stenosis   . Cataract   . CHF (congestive heart failure) ([place])   . CVA (cerebral vascular accident) ([place]) 04/28/2016  . Decreased vision    left eye  . Diabetes mellitus without complication ([place])    Takes Metformin  .  Hyperlipidemia   . Hypertension   . Salmonella bacteremia 04/29/2016  . Sepsis ([place]) 04/29/2016  . Stroke ([place]) 11/18/2015   no deficits  . TIA (transient ischemic attack)    affected left eye  . Urgency of urination     Past Surgical History:  Procedure Laterality Date  . AORTIC VALVE REPLACEMENT    . BACK SURGERY  80s  . CARDIAC VALVE REPLACEMENT  2010  . ENDARTERECTOMY  Right 01/13/2016   Procedure: ENDARTERECTOMY CAROTID - RIGHT;  Surgeon: Conrad Carpenter, MD;  Location: [place];  Service: Vascular;  Laterality: Right;  . EYE SURGERY    . laser surgery, left eye  Left 10-29-2015    retinal surgery/ Deloria Lair MD   . PERIPHERAL VASCULAR CATHETERIZATION Right 11/18/2015   Procedure: Carotid Angiography;  Surgeon: Conrad Richardson, MD;  Location: [place] [facility];  Service: Cardiovascular;  Laterality: Right;  . PERIPHERAL VASCULAR CATHETERIZATION N/A 11/18/2015   Procedure: Aortic Arch Angiography;  Surgeon: Conrad [place], MD;  Location: [facility];  Service: Cardiovascular;  Laterality: N/A;  . TEE WITHOUT CARDIOVERSION N/A 05/05/2016   Procedure: TRANSESOPHAGEAL ECHOCARDIOGRAM (TEE);  Surgeon: Lelon Perla, MD;  Location: [facility] ENDOSCOPY;  Service: Cardiovascular;  Laterality: N/A;     reports that he quit smoking about 33 years ago. His smoking use included cigarettes. He has a 40.00 pack-year smoking history. He has never used smokeless tobacco. He reports that he does not drink alcohol or use drugs.  No Known Allergies  Family History  Problem Relation Age of Onset  . Heart disease Mother   . Cancer Mother        breast  . Stroke Father 48  . Hypertension Father   . Early death Sister        infant death  . Heart disease Brother   . Hydrocephalus Brother   . Heart disease Brother   . Diabetes Brother   . Cancer Brother        prostat  . Healthy Son   . Healthy Son      Prior to Admission medications   Medication Sig Start Date End Date Taking? Authorizing Provider  aspirin EC 81 MG tablet Take 1 tablet (81 mg total) by mouth daily. 08/16/15  Yes Belva Crome, MD  atorvastatin (LIPITOR) 40 MG tablet TAKE 1 TABLET BY MOUTH  DAILY Patient taking differently: Take 40 mg by mouth at bedtime.  11/07/19  Yes Gottschalk, Leatrice Jewels M, DO  atropine 1 % ophthalmic solution Place 1 drop into the left eye 3 (three) times daily as needed (eye pain).   Yes  [provider]  cetirizine (ZYRTEC) 10 MG tablet Take 10 mg by mouth daily.   Yes [provider]  Dulaglutide (TRULICITY) 1.5 WU/[id number] SOPN Inject 1.5 mg into the skin once a week. Patient taking differently: Inject 1.5 mg into the skin every Sunday.  11/06/19  Yes [name], MD  hydrochlorothiazide (HYDRODIURIL) 25 MG tablet Take 1 tablet (25 mg total) by mouth daily. 10/02/19  Yes Gottschalk, Leatrice Jewels M, DO  ibuprofen (ADVIL) 200 MG tablet Take 400-600 mg by mouth every 6 (six) hours as needed (pain).   Yes [provider]  insulin degludec (TRESIBA FLEXTOUCH) 200 UNIT/ML FlexTouch Pen Inject 50 Units into the skin daily.   Yes [provider]  insulin lispro (HUMALOG) 100 UNIT/ML injection Inject 6 Units into the skin daily before supper.    Yes [provider]  latanoprost (XALATAN) 0.005 % ophthalmic solution Place 1 drop into the left eye at bedtime.    Yes [provider]  lisinopril (ZESTRIL) 40 MG tablet Take 1 tablet (40 mg total) by mouth daily. 10/02/19  Yes [name], DO  metoprolol succinate (TOPROL-XL) 100 MG 24 hr tablet Take with or immediately following a meal. Patient taking differently: Take 100 mg by mouth daily. Take with or immediately following a meal. 10/02/19  Yes Gottschalk, Ashly M, DO  vitamin C (ASCORBIC ACID) 500 MG tablet Take 500 mg by mouth daily.   Yes [provider]  vitamin E (VITAMIN E) 1000 UNIT capsule Take 1,000 Units by mouth daily.   Yes [provider]  amLODipine (NORVASC) 10 MG tablet TAKE 1 TABLET BY MOUTH  DAILY Patient not taking: Reported on 12/24/2019 11/07/19   [name], DO  Lancets [facility] ULTRASOFT) lancets Test BS BID Dx E11.9 09/11/19   [name], DO  ONETOUCH ULTRA test strip USE TO CHECK BLOOD SUGAR  TWICE DAILY 06/05/19   [name], DO    Physical Exam: Vitals:   12/24/19 1630 12/24/19 1645 12/24/19 1815 12/24/19 1938  BP:  123/70 (!) 139/93 125/68 123/85  Pulse: 63  65 69  Resp: 16  18 18   Temp:      TempSrc:      SpO2: 100%  99% 100%  Weight:      Height:        Constitutional: NAD, calm, comfortable, elderly gentleman laying flat in bed Vitals:   12/24/19 1630 12/24/19 1645 12/24/19 1815 12/24/19 1938  BP: 123/70 (!) 139/93 125/68 123/85  Pulse: 63  65 69  Resp: 16  18 18   Temp:      TempSrc:      SpO2: 100%  99% 100%  Weight:      Height:       Eyes: PERRL, injected left conjunctivae  ENMT: Mucous membranes are moist. Posterior pharynx clear of any exudate or lesions.  Neck: normal, supple Respiratory: clear to auscultation bilaterally, no wheezing, no crackles. Normal respiratory effort. No accessory muscle use.  Cardiovascular: Regular rate and rhythm, no murmurs / rubs / gallops. No extremity edema. 2+ pedal pulses.   Abdomen: no tenderness, no masses palpated.  Bowel sounds positive.  Musculoskeletal: no clubbing / cyanosis. No joint deformity upper and lower extremities.  Medial patella effusion noted on left knee with pain on flexion. Skin: no rashes, lesions, ulcers. No induration Neurologic: CN 2-12 grossly intact. Sensation intact, Strength 5/5 in all 4.  Intact finger-nose.  Intact heel-to-shin. Psychiatric: Normal judgment and insight. Alert and oriented x 3. Normal mood.     Labs on Admission: I have personally reviewed following labs and imaging studies  CBC: Recent Labs  Lab 12/21/19 1944 12/24/19 1350 12/24/19 1424  WBC 9.4 9.5  --   NEUTROABS 6.0 7.0  --   HGB 14.8 15.5 16.3  HCT 47.0 48.5 48.0  MCV 85.3 82.8  --   PLT 176 207  --    Basic Metabolic Panel: Recent Labs  Lab 12/21/19 1944 12/24/19 1350 12/24/19 1424  NA 140 134* 137  K 3.9 3.9 3.9  CL 103 101 98  CO2 27 25  --   GLUCOSE 115* 179* 177*  BUN 39* 33* 34*  CREATININE 2.10* 1.85* 1.90*  CALCIUM 8.9 9.2  --    GFR: Estimated Creatinine Clearance: 31.1 mL/min (A) (by C-G formula based on SCr  of  1.9 mg/dL (H)). Liver Function Tests: Recent Labs  Lab 12/21/19 1944 12/24/19 1350  AST 18 18  ALT 21 18  ALKPHOS 91 80  BILITOT 0.3 1.0  PROT 6.3* 6.7  ALBUMIN 3.9 3.6   No results for input(s): LIPASE, AMYLASE in the last 168 hours. No results for input(s): AMMONIA in the last 168 hours. Coagulation Profile: Recent Labs  Lab 12/24/19 1350  INR 1.1   Cardiac Enzymes: No results for input(s): CKTOTAL, CKMB, CKMBINDEX, TROPONINI in the last 168 hours. BNP (last 3 results) No results for input(s): PROBNP in the last 8760 hours. HbA1C: No results for input(s): HGBA1C in the last 72 hours. CBG: Recent Labs  Lab 12/24/19 1433  GLUCAP 162*   Lipid Profile: No results for input(s): CHOL, HDL, LDLCALC, TRIG, CHOLHDL, LDLDIRECT in the last 72 hours. Thyroid Function Tests: No results for input(s): TSH, T4TOTAL, FREET4, T3FREE, THYROIDAB in the last 72 hours. Anemia Panel: No results for input(s): VITAMINB12, FOLATE, FERRITIN, TIBC, IRON, RETICCTPCT in the last 72 hours. Urine analysis:    Component Value Date/Time   COLORURINE YELLOW 12/24/2019 1622   APPEARANCEUR CLEAR 12/24/2019 1622   LABSPEC 1.013 12/24/2019 1622   PHURINE 5.0 12/24/2019 1622   GLUCOSEU NEGATIVE 12/24/2019 1622   HGBUR NEGATIVE 12/24/2019 1622   BILIRUBINUR NEGATIVE 12/24/2019 1622   KETONESUR NEGATIVE 12/24/2019 1622   PROTEINUR NEGATIVE 12/24/2019 1622   UROBILINOGEN >8.0 (H) 10/30/2008 2328   NITRITE NEGATIVE 12/24/2019 1622   LEUKOCYTESUR NEGATIVE 12/24/2019 1622    Radiological Exams on Admission: CT HEAD WO CONTRAST  Result Date: 12/24/2019 CLINICAL DATA:  Unsteady on feet, off balance since Wednesday, intermittent dizziness, fell on Thursday and Friday, possible stroke; history stroke, atrial fibrillation, hypertension, CHF, diabetes mellitus, former smoker EXAM: CT HEAD WITHOUT CONTRAST TECHNIQUE: Contiguous axial images were obtained from the base of the skull through the vertex  without intravenous contrast. Sagittal and coronal MPR images reconstructed from axial data set. COMPARISON:  12/21/2019 FINDINGS: Brain: Generalized atrophy. Normal ventricular morphology. No midline shift or mass effect. Small vessel chronic ischemic changes of deep cerebral white matter. No intracranial hemorrhage, mass lesion, evidence of acute infarction, or extra-axial fluid collection. Vascular: Atherosclerotic calcification of internal carotid and vertebral arteries at skull base. No hyperdense vessels. Skull: Intact Sinuses/Orbits: Clear Other: N/A IMPRESSION: Atrophy with small vessel chronic ischemic changes of deep cerebral white matter. No acute intracranial abnormalities. Electronically Signed   By: Lavonia Dana M.D.   On: 12/24/2019 14:15   MR BRAIN WO CONTRAST  Result Date: 12/24/2019 CLINICAL DATA:  Unsteady gait, 3 fall; ataxia, stroke suspected. Additional provided: Intermittent dizziness, left knee pain, several recent falls. EXAM: MRI HEAD WITHOUT CONTRAST TECHNIQUE: Multiplanar, multiecho pulse sequences of the brain and surrounding structures were obtained without intravenous contrast. COMPARISON:  Prior head CT examinations 12/24/2019 and earlier, brain MRI 05/15/2019. CT angiogram neck 11/02/2014. FINDINGS: Brain: There are few tiny cortical/subcortical foci of restricted diffusion within the posterior right frontal lobe and right parietal lobe consistent with acute infarcts (for instance as seen on series 3, images 41, 38, 37). Redemonstrated advanced generalized parenchymal atrophy. As before, there are multiple chronic lacunar infarcts within the bilateral cerebral white matter. Otherwise mild chronic small vessel ischemic disease. No evidence of intracranial mass. No chronic intracranial blood products. No extra-axial fluid collection. No midline shift. Vascular: Signal abnormality within the V4 right vertebral artery consistent with known, chronic occlusion. Signal abnormality within  the visualized distal cervical left ICA and left ICA  siphon consistent with known chronic occlusion. As before there is reconstitution of the expected flow void within the paraclinoid left ICA. Skull and upper cervical spine: No focal marrow lesion. Sinuses/Orbits: Visualized orbits show no acute finding. Mild ethmoid sinus mucosal thickening. No significant mastoid effusion. IMPRESSION: Several tiny acute cortical/subcortical infarcts within the posterior right frontal lobe and right parietal lobe. As before, there are multiple chronic lacunar infarcts within the bilateral cerebral white matter. Otherwise mild chronic small vessel ischemic disease. Stable, advanced generalized parenchymal atrophy. Signal abnormality consistent with known chronic occlusion of the V4 right vertebral artery, distal cervical left ICA and left ICA siphon. As before, there is reconstitution of the expected flow void within the paraclinoid left ICA. Mild ethmoid sinus mucosal thickening. Electronically Signed   By: Kellie Simmering DO   On: 12/24/2019 18:34   DG Knee Complete 4 Views Left  Result Date: 12/24/2019 CLINICAL DATA:  Fall.  Swelling. EXAM: LEFT KNEE - COMPLETE 4+ VIEW COMPARISON:  None. FINDINGS: There are moderate degenerative changes involving the LATERAL, MEDIAL, and patellofemoral compartments. Small joint effusion is present. No acute fracture. There is atherosclerotic calcification of the popliteal artery. IMPRESSION: Degenerative changes and small joint effusion. Electronically Signed   By: Nolon Nations M.D.   On: 12/24/2019 17:07      Assessment/Plan  New acute cortical/subcortical infarct of the posterior right frontal lobe and right parietal lobe with hx of CVA and PAD with carotid occlusions Obtain MRA neck/head continue aspirin, statin  check HbA1C, lipid panel  echocardiogram  PT/OT/SLT Frequent neuro checks and keep on telemetry Does not need to allow for permissive HTN per neurology   Left  knee pain Effusion noted on X-ray but no fractures RICE   CKD stage IIIb Stable creatinine  Type 2 diabetes Last hemoglobin A1c of 5.8 in February.  Will repeat with stroke work-up. At home takes 50 units of Tresiba, 6 units of Humalog before supper Sensitive sliding scale now with fairly controlled BG  Hypertension Okay to resume anti-hypertensive since symptoms are several days out  Hx of aortic valve repair/CAD Continue aspirin  Status is: Inpatient  Remains inpatient appropriate because:Inpatient level of care appropriate due to severity of illness   Dispo: The patient is from: Home              Anticipated d/c is to: tbd              Anticipated d/c date is: 2 days              Patient currently is not medically stable to d/c.         Orene Desanctis DO [facility]   If 7PM-7AM, please contact night-coverage [email or web address]   12/24/2019, 7:41 PM

## 2019-12-24 NOTE — ED Provider Notes (Addendum)
[facility] EMERGENCY DEPARTMENT Provider Note   CSN: [id number] Arrival date & time: 12/24/19  1301     History Chief Complaint  Patient presents with  . Dizziness  . Fall  . Weakness    Evan Stanley is a 79 y.o. male With history of CAD s/p CABG with AVR, hypertension, hyperlipidemia, CKD, diabetes on Trulicity, PVD, stroke presents to the ER for evaluation of difficulty walking.  This began suddenly 3 days ago.  Woke up on Wednesday and tried to walk to the phone and he fell.  Describes feeling "off balance" and like he is leaning backwards.  Reports history of dizziness and "not thinking too good" for a while, being addressed by PCP who is currently changing some of his medicines.  This is not new.  Patient has had a fall every day for the last 3 days.  He fell on Thursday when he again tried to get up to pick up the phone.  Yesterday he had to use a walker because he felt very off balance.  He has hurt his left knee during 1 of these falls and states now walking is even harder due to the pain in his knee.  Patient and wife state usually patient walks independently, maws the lawn and plays golf and this is very different for him.  Wife is concerned because when he had a stroke 4 years ago he had difficulty walking.  Since his stroke is recovered almost completely, has intermittent weakness on his left side but his gait is usually steady.  Patient went to [facility], ER a couple of days ago and he had a head CT and lab work.  Wife states they told him everything was normal but he was probably dehydrated.  He went to urgent care yesterday due to ongoing left knee pain and was discharged and told that they would call him with the results but wife states they have not called him back, she is requesting another x-ray of the left knee.  He has no associated lightheadedness, syncope, chest pain, palpitations or shortness of breath.  No associated headache.  Chronic blindness in  the left eye, vision otherwise unchanged.  Denies difficulty with speech, one-sided numbness or weakness.  No recent illness, fevers, vomiting, diarrhea, cough, dysuria.   HPI     Past Medical History:  Diagnosis Date  . AKI (acute kidney injury) ([place])   . Allergy   . Anxiety   . Arthritis    Right shoulder  . Atrial fibrillation ([place])   . Benign essential HTN   . Carotid artery stenosis   . Cataract   . CHF (congestive heart failure) ([place])   . CVA (cerebral vascular accident) ([place]) 04/28/2016  . Decreased vision    left eye  . Diabetes mellitus without complication ([place])    Takes Metformin  . Hyperlipidemia   . Hypertension   . Salmonella bacteremia 04/29/2016  . Sepsis ([place]) 04/29/2016  . Stroke ([place]) 11/18/2015   no deficits  . TIA (transient ischemic attack)    affected left eye  . Urgency of urination     Patient Active Problem List   Diagnosis Date Noted  . Posterior vitreous detachment of right eye 11/29/2019  . Branch retinal artery occlusion of left eye 11/29/2019  . Rubeosis iridis of left eye 11/29/2019  . Glaucoma associated with ocular disorder, left, severe stage 11/29/2019  . TIA (transient ischemic attack) 05/13/2019  . Coronary arteriosclerosis in native artery 12/12/2018  .  Type 2 diabetes mellitus ([place]) 12/12/2018  . Right Carotid artery stenosis 11/03/2017  . Crohn's disease ([place]) 04/30/2016  . Stage 3 chronic kidney disease 04/29/2016  . Late effects of cerebrovascular disease 04/03/2016  . History of aortic valve replacement 01/05/2016  . Left Carotid artery occlusion 11/18/2015  . Disorder of carotid artery ([place]) 11/18/2015  . Occlusion and stenosis of right carotid artery 10/26/2014  . Hyperlipidemia associated w/ type 2 DM goal <70 07/05/2013  . Hypertension associated with diabetes ([place]) 07/05/2013  . Hyperlipidemia, unspecified 07/05/2013    Past Surgical History:  Procedure Laterality Date  . AORTIC VALVE REPLACEMENT    . BACK  SURGERY  80s  . CARDIAC VALVE REPLACEMENT  2010  . ENDARTERECTOMY Right 01/13/2016   Procedure: ENDARTERECTOMY CAROTID - RIGHT;  Surgeon: Conrad Athelstan, MD;  Location: [facility];  Service: Vascular;  Laterality: Right;  . EYE SURGERY    . laser surgery, left eye  Left 10-29-2015    retinal surgery/ Deloria Lair MD   . PERIPHERAL VASCULAR CATHETERIZATION Right 11/18/2015   Procedure: Carotid Angiography;  Surgeon: Conrad Crenshaw, MD;  Location: [facility] CV LAB;  Service: Cardiovascular;  Laterality: Right;  . PERIPHERAL VASCULAR CATHETERIZATION N/A 11/18/2015   Procedure: Aortic Arch Angiography;  Surgeon: Conrad Lugoff, MD;  Location: [facility] CV LAB;  Service: Cardiovascular;  Laterality: N/A;  . TEE WITHOUT CARDIOVERSION N/A 05/05/2016   Procedure: TRANSESOPHAGEAL ECHOCARDIOGRAM (TEE);  Surgeon: Lelon Perla, MD;  Location: [facility] ENDOSCOPY;  Service: Cardiovascular;  Laterality: N/A;       Family History  Problem Relation Age of Onset  . Heart disease Mother   . Cancer Mother        breast  . Stroke Father 51  . Hypertension Father   . Early death Sister        infant death  . Heart disease Brother   . Hydrocephalus Brother   . Heart disease Brother   . Diabetes Brother   . Cancer Brother        prostat  . Healthy Son   . Healthy Son     Social History   Tobacco Use  . Smoking status: Former Smoker    Packs/day: 2.00    Years: 20.00    Pack years: 40.00    Types: Cigarettes    Quit date: 07/20/1986    Years since quitting: 33.4  . Smokeless tobacco: Never Used  Substance Use Topics  . Alcohol use: No    Alcohol/week: 0.0 standard drinks  . Drug use: No    Home Medications Prior to Admission medications   Medication Sig Start Date End Date Taking? Authorizing Provider  aspirin EC 81 MG tablet Take 1 tablet (81 mg total) by mouth daily. 08/16/15  Yes Belva Crome, MD  atorvastatin (LIPITOR) 40 MG tablet TAKE 1 TABLET BY MOUTH  DAILY Patient taking differently: Take  40 mg by mouth at bedtime.  11/07/19  Yes Gottschalk, Leatrice Jewels M, DO  atropine 1 % ophthalmic solution Place 1 drop into the left eye 3 (three) times daily as needed (eye pain).   Yes [provider]  cetirizine (ZYRTEC) 10 MG tablet Take 10 mg by mouth daily.   Yes [provider]  Dulaglutide (TRULICITY) 1.5 YQ/0.3KV SOPN Inject 1.5 mg into the skin once a week. Patient taking differently: Inject 1.5 mg into the skin every Sunday.  11/06/19  Yes [name], MD  hydrochlorothiazide (HYDRODIURIL) 25 MG tablet Take  1 tablet (25 mg total) by mouth daily. 10/02/19  Yes Gottschalk, Leatrice Jewels M, DO  ibuprofen (ADVIL) 200 MG tablet Take 400-600 mg by mouth every 6 (six) hours as needed (pain).   Yes [provider]  insulin degludec (TRESIBA FLEXTOUCH) 200 UNIT/ML FlexTouch Pen Inject 50 Units into the skin daily.   Yes [provider]  insulin lispro (HUMALOG) 100 UNIT/ML injection Inject 6 Units into the skin daily before supper.    Yes [provider]  latanoprost (XALATAN) 0.005 % ophthalmic solution Place 1 drop into the left eye at bedtime.    Yes [provider]  lisinopril (ZESTRIL) 40 MG tablet Take 1 tablet (40 mg total) by mouth daily. 10/02/19  Yes [name], DO  metoprolol succinate (TOPROL-XL) 100 MG 24 hr tablet Take with or immediately following a meal. Patient taking differently: Take 100 mg by mouth daily. Take with or immediately following a meal. 10/02/19  Yes Gottschalk, Ashly M, DO  vitamin C (ASCORBIC ACID) 500 MG tablet Take 500 mg by mouth daily.   Yes [provider]  vitamin E (VITAMIN E) 1000 UNIT capsule Take 1,000 Units by mouth daily.   Yes [provider]  amLODipine (NORVASC) 10 MG tablet TAKE 1 TABLET BY MOUTH  DAILY Patient not taking: Reported on 12/24/2019 11/07/19   [name], DO  Lancets [facility] ULTRASOFT) lancets Test BS BID Dx E11.9 09/11/19   [name], DO  ONETOUCH ULTRA  test strip USE TO CHECK BLOOD SUGAR  TWICE DAILY 06/05/19   [name], DO    Allergies    Patient has no known allergies.  Review of Systems   Review of Systems  Eyes: Positive for visual disturbance (chronic left eye blindness).  Neurological: Positive for dizziness (chronic).       Gait difficulty  All other systems reviewed and are negative.   Physical Exam Updated Vital Signs BP 125/68   Pulse 65   Temp 98.5 F (36.9 C) (Oral)   Resp 18   Ht 5' 5"  (1.651 m)   Wt 73.9 kg   SpO2 99%   BMI 27.12 kg/m   Physical Exam Vitals and nursing note reviewed.  Constitutional:      General: He is not in acute distress.    Appearance: He is well-developed.     Comments: NAD.  HENT:     Head: Normocephalic and atraumatic.     Right Ear: External ear normal.     Left Ear: External ear normal.     Nose: Nose normal.  Eyes:     General: No scleral icterus.    Conjunctiva/sclera: Conjunctivae normal.     Comments: Left conjunctiva/sclera is injected, patient states this is chronic. No vision in left eye (patient states history of eye stroke). Full EOMs bilaterally. Vision fields intact in right eye.  Cardiovascular:     Rate and Rhythm: Normal rate and regular rhythm.     Heart sounds: Normal heart sounds.  Pulmonary:     Effort: Pulmonary effort is normal.     Breath sounds: Normal breath sounds.  Musculoskeletal:        General: Swelling and tenderness present. Normal range of motion.     Cervical back: Normal range of motion and neck supple.     Comments: Left knee: mild edema diffusely worse infrapatellar.  Full passive ROM with pain. Normal J tracking of patella, no crepitus. Patient able to put put weight on knee but  with pain. Small abrasion lateral knee. No popliteal space fullness or tenderness.   Skin:    General: Skin is warm and dry.     Capillary Refill: Capillary refill takes less than 2 seconds.  Neurological:     Mental Status: He is alert and  oriented to person, place, and time.     Comments:  Alert and oriented to self, place, time and event.  Speech is fluent without dysarthria or dysphasia. Strength 5/5 with hand grip and ankle F/E.   Sensation to light touch intact in face, hands and feet I attempted to stand and ambulate patient, he needed assist standing up. When he stood he leaned back and needed spotting. Unable to ambulate due to unsteadiness and left knee pain.  No pronator drift. No leg drop. Normal finger-to-nose.   CN I not tested CN II grossly intact visual fields bilaterally. Unable to visualize posterior eye. CN III, IV, VI PEERL and EOMs intact bilaterally CN V light touch intact in all 3 divisions of trigeminal nerve CN VII facial movements symmetric CN VIII not tested CN IX, X no uvula deviation, symmetric rise of soft palate  CN XI 5/5 SCM and trapezius strength bilaterally  CN XII Midline tongue protrusion, symmetric L/R movements  Psychiatric:        Behavior: Behavior normal.        Thought Content: Thought content normal.        Judgment: Judgment normal.     ED Results / Procedures / Treatments   Labs (all labs ordered are listed, but only abnormal results are displayed) Labs Reviewed  CBC - Abnormal; Notable for the following components:      Result Value   RBC 5.86 (*)    All other components within normal limits  COMPREHENSIVE METABOLIC PANEL - Abnormal; Notable for the following components:   Sodium 134 (*)    Glucose, Bld 179 (*)    BUN 33 (*)    Creatinine, Ser 1.85 (*)    GFR calc non Af Amer 35 (*)    GFR calc Af Amer 40 (*)    All other components within normal limits  I-STAT CHEM 8, ED - Abnormal; Notable for the following components:   BUN 34 (*)    Creatinine, Ser 1.90 (*)    Glucose, Bld 177 (*)    All other components within normal limits  CBG MONITORING, ED - Abnormal; Notable for the following components:   Glucose-Capillary 162 (*)    All other components within  normal limits  PROTIME-INR  APTT  DIFFERENTIAL  URINALYSIS, ROUTINE W REFLEX MICROSCOPIC    EKG EKG Interpretation  Date/Time:  Sunday December 24 2019 13:25:25 EDT Ventricular Rate:  70 PR Interval:  170 QRS Duration: 80 QT Interval:  364 QTC Calculation: 393 R Axis:   74 Text Interpretation: Sinus rhythm with occasional Premature ventricular complexes Nonspecific ST abnormality Abnormal ECG No significant change since last tracing Confirmed by Wandra Arthurs [phone]) on 12/24/2019 3:55:51 PM   Radiology CT HEAD WO CONTRAST  Result Date: 12/24/2019 CLINICAL DATA:  Unsteady on feet, off balance since Wednesday, intermittent dizziness, fell on Thursday and Friday, possible stroke; history stroke, atrial fibrillation, hypertension, CHF, diabetes mellitus, former smoker EXAM: CT HEAD WITHOUT CONTRAST TECHNIQUE: Contiguous axial images were obtained from the base of the skull through the vertex without intravenous contrast. Sagittal and coronal MPR images reconstructed from axial data set. COMPARISON:  12/21/2019 FINDINGS: Brain: Generalized atrophy. Normal ventricular morphology. No  midline shift or mass effect. Small vessel chronic ischemic changes of deep cerebral white matter. No intracranial hemorrhage, mass lesion, evidence of acute infarction, or extra-axial fluid collection. Vascular: Atherosclerotic calcification of internal carotid and vertebral arteries at skull base. No hyperdense vessels. Skull: Intact Sinuses/Orbits: Clear Other: N/A IMPRESSION: Atrophy with small vessel chronic ischemic changes of deep cerebral white matter. No acute intracranial abnormalities. Electronically Signed   By: Lavonia Dana M.D.   On: 12/24/2019 14:15   MR BRAIN WO CONTRAST  Result Date: 12/24/2019 CLINICAL DATA:  Unsteady gait, 3 fall; ataxia, stroke suspected. Additional provided: Intermittent dizziness, left knee pain, several recent falls. EXAM: MRI HEAD WITHOUT CONTRAST TECHNIQUE: Multiplanar, multiecho  pulse sequences of the brain and surrounding structures were obtained without intravenous contrast. COMPARISON:  Prior head CT examinations 12/24/2019 and earlier, brain MRI 05/15/2019. CT angiogram neck 11/02/2014. FINDINGS: Brain: There are few tiny cortical/subcortical foci of restricted diffusion within the posterior right frontal lobe and right parietal lobe consistent with acute infarcts (for instance as seen on series 3, images 41, 38, 37). Redemonstrated advanced generalized parenchymal atrophy. As before, there are multiple chronic lacunar infarcts within the bilateral cerebral white matter. Otherwise mild chronic small vessel ischemic disease. No evidence of intracranial mass. No chronic intracranial blood products. No extra-axial fluid collection. No midline shift. Vascular: Signal abnormality within the V4 right vertebral artery consistent with known, chronic occlusion. Signal abnormality within the visualized distal cervical left ICA and left ICA siphon consistent with known chronic occlusion. As before there is reconstitution of the expected flow void within the paraclinoid left ICA. Skull and upper cervical spine: No focal marrow lesion. Sinuses/Orbits: Visualized orbits show no acute finding. Mild ethmoid sinus mucosal thickening. No significant mastoid effusion. IMPRESSION: Several tiny acute cortical/subcortical infarcts within the posterior right frontal lobe and right parietal lobe. As before, there are multiple chronic lacunar infarcts within the bilateral cerebral white matter. Otherwise mild chronic small vessel ischemic disease. Stable, advanced generalized parenchymal atrophy. Signal abnormality consistent with known chronic occlusion of the V4 right vertebral artery, distal cervical left ICA and left ICA siphon. As before, there is reconstitution of the expected flow void within the paraclinoid left ICA. Mild ethmoid sinus mucosal thickening. Electronically Signed   By: Kellie Simmering DO    On: 12/24/2019 18:34   DG Knee Complete 4 Views Left  Result Date: 12/24/2019 CLINICAL DATA:  Fall.  Swelling. EXAM: LEFT KNEE - COMPLETE 4+ VIEW COMPARISON:  None. FINDINGS: There are moderate degenerative changes involving the LATERAL, MEDIAL, and patellofemoral compartments. Small joint effusion is present. No acute fracture. There is atherosclerotic calcification of the popliteal artery. IMPRESSION: Degenerative changes and small joint effusion. Electronically Signed   By: Nolon Nations M.D.   On: 12/24/2019 17:07    Procedures .Critical Care Performed by: Kinnie Feil, PA-C Authorized by: Kinnie Feil, PA-C   Critical care provider statement:    Critical care time (minutes):  45   Critical care was necessary to treat or prevent imminent or life-threatening deterioration of the following conditions:  CNS failure or compromise (CVA)   Critical care was time spent personally by me on the following activities:  Discussions with consultants, evaluation of patient's response to treatment, examination of patient, ordering and performing treatments and interventions, ordering and review of laboratory studies, ordering and review of radiographic studies, pulse oximetry, re-evaluation of patient's condition, obtaining history from patient or surrogate and review of old charts   I assumed direction of  critical care for this patient from another provider in my specialty: no     (including critical care time)  Medications Ordered in ED Medications  meclizine (ANTIVERT) tablet 25 mg (25 mg Oral Refused 12/24/19 1621)  sodium chloride flush (NS) 0.9 % injection 3 mL (3 mLs Intravenous Given 12/24/19 1847)    ED Course  I have reviewed the triage vital signs and the nursing notes.  Pertinent labs & imaging results that were available during my care of the patient were reviewed by me and considered in my medical decision making (see chart for details).  Clinical Course as of Dec 23 1848   Sun Dec 24, 2019  1619 Sinus rhythm with occasional Premature ventricular complexes Nonspecific ST abnormality Abnormal ECG No significant change since last tracing Confirmed by Wandra Arthurs [phone]) on 12/24/2019 3:55:51 PM  ED EKG [CG]  1619 IMPRESSION: Atrophy with small vessel chronic ischemic changes of deep cerebral white matter.  No acute intracranial abnormalities    CT HEAD WO CONTRAST [CG]  1619 Creatinine(!): 1.85 [CG]  1619 GFR, Est Non African American(!): 35 [CG]  1619 Glucose(!): 179 [CG]  1619 Sodium(!): 134 [CG]  1620 Hemoglobin: 15.5 [CG]  1840 IMPRESSION: Several tiny acute cortical/subcortical infarcts within the posterior right frontal lobe and right parietal lobe.  As before, there are multiple chronic lacunar infarcts within the bilateral cerebral white matter. Otherwise mild chronic small vessel ischemic disease.  Stable, advanced generalized parenchymal atrophy.  Signal abnormality consistent with known chronic occlusion of the V4 right vertebral artery, distal cervical left ICA and left ICA siphon. As before, there is reconstitution of the expected flow void within the paraclinoid left ICA.  Mild ethmoid sinus mucosal thickening.   Electronically Signed By: Kellie Simmering DO On: 12/24/2019 18:34    MR BRAIN WO CONTRAST [CG]    Clinical Course User Index [CG] Kinnie Feil, PA-C   MDM Rules/Calculators/A&P                       This patient complains of unsteady gait, feeling off balance.  I obtained additional history from wife at bedside who was concerned about a stroke because he had similar unsteady gait when last had a stroke.  Previous medical records available, nursing notes reviewed to obtain more history and assist with MDM  Chief complain involves an extensive number of treatment options and is a complaint that carries with it a high risk of complications and morbidity and mortality. The differential diagnosis  includes TIA, CVA.  Patient denies having associated chest pain, palpitations, shortness of breath, syncope and cardiac related etiology is less likely.  He reports several years ago having history of vertigo and this is considered a possibility but he states he does not have any spinning sensation, nausea, vomiting.  I ordered medication meclizine but patient declined stating he does not feel dizzy.  I ordered laboratory studies as above.  I ordered imaging studies including head CT, MRI.  I ordered, reviewed and personally visualized and interpreted the above labs and/or imaging studies.  ER work up reveals stable creatinine, hyperglycemia without evidence of DKA.  CT head nona cute. MRI shows chronic and acute Cortical/subcortical infarcts in posterior right frontal and right parietal lobe.  Small knee effusion.   I consulted and discussed ER work up with EDP and neuro-hospitalist. Plan to admit.   Critical interventions included MRI, neurology consult, admission.   I have re-evaluated the patient and clinical decline.  Updated about MRI findings and plan to admit.   1900: Spoke to Dr Cheral Marker. Pending hospitalist admit.   Final Clinical Impression(s) / ED Diagnoses Final diagnoses:  Cerebrovascular accident (CVA), unspecified mechanism [facility])    Rx / DC Orders ED Discharge Orders    None         Kinnie Feil, PA-C 12/24/19 1901    Drenda Freeze, MD 12/25/19 [phone]

## 2019-12-25 ENCOUNTER — Inpatient Hospital Stay (HOSPITAL_COMMUNITY): Payer: Medicare Other

## 2019-12-25 ENCOUNTER — Encounter (HOSPITAL_COMMUNITY): Payer: Self-pay | Admitting: Family Medicine

## 2019-12-25 DIAGNOSIS — I6389 Other cerebral infarction: Secondary | ICD-10-CM

## 2019-12-25 LAB — ECHOCARDIOGRAM COMPLETE
Height: 65 in
Weight: 2649.05 oz

## 2019-12-25 LAB — HEMOGLOBIN A1C
Hgb A1c MFr Bld: 7.4 % — ABNORMAL HIGH (ref 4.8–5.6)
Mean Plasma Glucose: 165.68 mg/dL

## 2019-12-25 LAB — LIPID PANEL
Cholesterol: 89 mg/dL (ref 0–200)
HDL: 30 mg/dL — ABNORMAL LOW (ref >40)
LDL Cholesterol: 47 mg/dL (ref 0–99)
Total CHOL/HDL Ratio: 3 RATIO
Triglycerides: 60 mg/dL (ref <150)
VLDL: 12 mg/dL (ref 0–40)

## 2019-12-25 LAB — GLUCOSE, CAPILLARY
Glucose-Capillary: 182 mg/dL — ABNORMAL HIGH (ref 70–99)
Glucose-Capillary: 100 mg/dL — ABNORMAL HIGH (ref 70–99)

## 2019-12-25 MED ORDER — HYDROCHLOROTHIAZIDE 25 MG PO TABS
25.0000 mg | ORAL_TABLET | Freq: Every day | ORAL | Status: DC
  Administered 2019-12-25 – 2019-12-27 (×3): 25 mg via ORAL
  Filled 2019-12-25 (×3): qty 1

## 2019-12-25 MED ORDER — INSULIN ASPART 100 UNIT/ML ~~LOC~~ SOLN
0.0000 [IU] | Freq: Three times a day (TID) | SUBCUTANEOUS | Status: DC
  Administered 2019-12-25: 3 [IU] via SUBCUTANEOUS
  Administered 2019-12-26 (×3): 2 [IU] via SUBCUTANEOUS
  Administered 2019-12-27 (×2): 3 [IU] via SUBCUTANEOUS

## 2019-12-25 MED ORDER — PERFLUTREN LIPID MICROSPHERE
1.0000 mL | INTRAVENOUS | Status: AC | PRN
  Administered 2019-12-25: 4 mL via INTRAVENOUS
  Filled 2019-12-25: qty 10

## 2019-12-25 MED ORDER — LISINOPRIL 40 MG PO TABS
40.0000 mg | ORAL_TABLET | Freq: Every day | ORAL | Status: DC
  Administered 2019-12-25 – 2019-12-27 (×3): 40 mg via ORAL
  Filled 2019-12-25 (×3): qty 1

## 2019-12-25 MED ORDER — METOPROLOL SUCCINATE ER 100 MG PO TB24
100.0000 mg | ORAL_TABLET | Freq: Every day | ORAL | Status: DC
  Administered 2019-12-25 – 2019-12-27 (×3): 100 mg via ORAL
  Filled 2019-12-25 (×3): qty 1

## 2019-12-25 MED ORDER — INSULIN ASPART 100 UNIT/ML ~~LOC~~ SOLN: 0.0000 [IU] | Freq: Every day | SUBCUTANEOUS | Status: DC

## 2019-12-25 NOTE — Plan of Care (Signed)
  Problem: Consults Goal: Venous Thromboembolism Patient Education Description: See Patient Education Module for education specifics. Outcome: Progressing Goal: Diagnosis - Venous Thromboembolism (VTE) Description: Choose a selection Outcome: Progressing Goal: Pharmacy Consult for anticoagulation Outcome: Progressing Goal: Skin Care Protocol Initiated - if Braden Score 18 or less Description: If consults are not indicated, leave blank or document N/A Outcome: Progressing Goal: Nutrition Consult-if indicated Outcome: Progressing Goal: Diabetes Guidelines if Diabetic/Glucose > 140 Description: If diabetic or lab glucose is > 140 mg/dl - Initiate Diabetes/Hyperglycemia Guidelines & Document Interventions  Outcome: Progressing   Problem: Phase I Progression Outcomes Goal: Pain controlled with appropriate interventions Outcome: Progressing Goal: Dyspnea controlled at rest (PE) Outcome: Progressing Goal: Tolerating diet Outcome: Progressing Goal: Initial discharge plan identified Outcome: Progressing Goal: Voiding-avoid urinary catheter unless indicated Outcome: Progressing Goal: Hemodynamically stable Outcome: Progressing Goal: Other Phase I Outcomes/Goals Outcome: Progressing   Problem: Phase II Progression Outcomes Goal: Therapeutic drug levels for anticoagulation Outcome: Progressing Goal: 02 sats trending upward/stable (PE) Outcome: Progressing Goal: Discharge plan established Outcome: Progressing Goal: Tolerating diet Outcome: Progressing Goal: Other Phase II Outcomes/Goals Outcome: Progressing   Problem: Phase III Progression Outcomes Goal: 02 sats stabilized Outcome: Progressing Goal: Activity at appropriate level-compared to baseline Description: (UP IN CHAIR FOR HEMODIALYSIS) Outcome: Progressing Goal: Discharge plan remains appropriate-arrangements made Outcome: Progressing Goal: Other Phase III Outcomes/Goals Outcome: Progressing   Problem: Discharge  Progression Outcomes Goal: Barriers To Progression Addressed/Resolved Outcome: Progressing Goal: Discharge plan in place and appropriate Outcome: Progressing Goal: Pain controlled with appropriate interventions Outcome: Progressing Goal: Hemodynamically stable Outcome: Progressing Goal: Complications resolved/controlled Outcome: Progressing Goal: Tolerating diet Outcome: Progressing Goal: Activity appropriate for discharge plan Outcome: Progressing Goal: Other Discharge Outcomes/Goals Outcome: Progressing

## 2019-12-25 NOTE — Evaluation (Signed)
Physical Therapy Evaluation Patient Details Name: Evan Stanley MRN: [id number] DOB: [birth year] Today's Date: 12/25/2019   History of Present Illness  Evan Stanley is a 79 y.o. 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 MRI showed several tiny acute cortical/subcortical infarcts within the posterior right frontal lobe and right parietal lobe.  Clinical Impression   Pt presents with LLE weakness, L knee pain s/p falls, impaired sitting and standing balance, impaired cognition vs baseline, impaired gait, and decreased activity tolerance. Pt to benefit from acute PT to address deficits. Pt ambulated room distance with use of RW and assist from PT to steady and guard/block LLE, pt with buckling and antalgic gait. At baseline, pt ambulates without AD, enjoys playing golf, and is completely independent. Pt is a great CIR candidate, is very motivated to return to PLOF. PT to progress mobility as tolerated, and will continue to follow acutely.      Follow Up Recommendations CIR    Equipment Recommendations  None recommended by PT    Recommendations for Other Services       Precautions / Restrictions Precautions Precautions: Fall Restrictions Weight Bearing Restrictions: No      Mobility  Bed Mobility Overal bed mobility: Needs Assistance             General bed mobility comments: up in chair upon PT arrival, sitting EOB with OT entering room PT exit  Transfers Overall transfer level: Needs assistance Equipment used: Rolling walker (2 wheeled) Transfers: Sit to/from Stand Sit to Stand: Min assist         General transfer comment: Min assist for initial power up, steadying. Verbal cuing for hand  placement when rising, initially requests for PT to hold RW to steady it so he could pull up on it but PT educated pt on safety issues with this technique.  Ambulation/Gait Ambulation/Gait assistance: Min assist Gait Distance (Feet): 30 Feet Assistive device: Rolling walker (2 wheeled) Gait Pattern/deviations: Step-through pattern;Decreased stride length;Trunk flexed;Decreased stance time - left;Decreased weight shift to left;Antalgic Gait velocity: decr   General Gait Details: Min assist to steady, block L knee buckling intermittently during gait. Pt with antalgic gait secondary to hitting L knee during fall. verbal cuing for sequencing, placement in RW, keeping RW with him during transitional movements (turning especially).  Stairs            Wheelchair Mobility    Modified Rankin (Stroke Patients Only) Modified Rankin (Stroke Patients Only) Pre-Morbid Rankin Score: No symptoms Modified Rankin: Moderately severe disability     Balance Overall balance assessment: Needs assistance Sitting-balance support: Single extremity supported;Feet supported Sitting balance-Leahy Scale: Fair Sitting balance - Comments: able to sit EOB without PT support, tends to lean posteriorly in both sitting and standing Postural control: Posterior lean;Left lateral lean Standing balance support: Single extremity supported;During functional activity Standing balance-Leahy Scale: Poor Standing balance comment: reliant on RW, PT assist for stability. Posterior leaning upon initial standing                             Pertinent Vitals/Pain Pain Assessment: Faces Faces Pain Scale: Hurts little more Pain Location: left knee Pain Descriptors / Indicators: Grimacing Pain Intervention(s):  Limited activity within patient's tolerance;Monitored during session;Repositioned    Home Living Family/patient expects to be discharged to:: Private residence Living Arrangements: Spouse/significant  other Available Help at Discharge: Family;Available 24 hours/day Type of Home: House Home Access: Stairs to enter Entrance Stairs-Rails: None Entrance Stairs-Number of Steps: 1 Home Layout: Laundry or work area in [place]: Environmental consultant - 2 wheels Additional Comments: drives [place] weekly to see friends and walk the course. reports fall in june at golf course chipping on an incline    Prior Function Level of Independence: Independent with assistive device(s)         Comments: using the walker for ambulation since Wednesday, falls x3. Prior to new onset balance deficits and falls, pt ambulating without AD, playing golf with friends.     Hand Dominance   Dominant Hand: Right    Extremity/Trunk Assessment   Upper Extremity Assessment Upper Extremity Assessment: Defer to OT evaluation LUE Deficits / Details: previous rotator cuff injury (shoulder flexion about 80*) strength grossly 4/5;RUE 5/5; decreased rapid alternating movement speed, decreased coordination thumb to finger opposition;otherwise WFL LUE Sensation: WNL LUE Coordination: decreased fine motor    Lower Extremity Assessment Lower Extremity Assessment: LLE deficits/detail;RLE deficits/detail RLE Deficits / Details: MMT: hip flexion 4+/5, hip abd 5/5, hip add 5/5, knee extension 4/5, knee flexion 4/5, full AROM PF/DF LLE Deficits / Details: MMT (limited by L knee pain): hip flexion 3+/5, hip abd 3/5, hip add 3/5, knee extension 3/5, knee flexion 3+/5, full AROM PF/DF    Cervical / Trunk Assessment Cervical / Trunk Assessment: Normal  Communication   Communication: HOH  Cognition Arousal/Alertness: Awake/alert Behavior During Therapy: WFL for tasks assessed/performed Overall Cognitive Status: Impaired/Different from baseline Area of Impairment: Attention;Safety/judgement;Awareness;Problem solving;Following commands                   Current Attention Level: Selective   Following  Commands: Follows multi-step commands with increased time Safety/Judgement: Decreased awareness of safety;Decreased awareness of deficits Awareness: Emergent Problem Solving: Slow processing;Decreased initiation;Requires verbal cues;Requires tactile cues General Comments: Pt A&Ox4, requires multimodal cuing for safety during mobility especially during transitional movements (sit<>stand, turning). Looks to wife to answer some subjective questions      General Comments General comments (skin integrity, edema, etc.): pt's son and wife present    Exercises     Assessment/Plan    PT Assessment Patient needs continued PT services  PT Problem List Decreased strength;Decreased mobility;Decreased safety awareness;Decreased coordination;Decreased activity tolerance;Decreased balance;Decreased knowledge of use of DME;Pain;Decreased cognition       PT Treatment Interventions DME instruction;Therapeutic activities;Gait training;Therapeutic exercise;Patient/family education;Balance training;Neuromuscular re-education;Functional mobility training    PT Goals (Current goals can be found in the Care Plan section)  Acute Rehab PT Goals Patient Stated Goal: return to independence PT Goal Formulation: With patient Time For Goal Achievement: 01/08/20 Potential to Achieve Goals: Good    Frequency Min 4X/week   Barriers to discharge        Co-evaluation               AM-PAC PT "6 Clicks" Mobility  Outcome Measure Help needed turning from your back to your side while in a flat bed without using bedrails?: A Little Help needed moving from lying on your back to sitting on the side of a flat bed without using bedrails?: A Little Help needed moving to and from a bed to a chair (including a wheelchair)?: A Little Help needed standing up from  a chair using your arms (e.g., wheelchair or bedside chair)?: A Little Help needed to walk in hospital room?: A Little Help needed climbing 3-5 steps with a  railing? : A Lot 6 Click Score: 17    End of Session Equipment Utilized During Treatment: Gait belt Activity Tolerance: Patient limited by fatigue Patient left: in bed;with call bell/phone within reach;Other (comment);with family/visitor present(sitting EOB with OT) Nurse Communication: Mobility status PT Visit Diagnosis: Hemiplegia and hemiparesis Hemiplegia - Right/Left: Left Hemiplegia - dominant/non-dominant: Non-dominant Hemiplegia - caused by: Cerebral infarction    Time: 3474-2595 PT Time Calculation (min) (ACUTE ONLY): 15 min   Charges:   PT Evaluation $PT Eval Low Complexity: 1 Low         Shunte Senseney E, PT Acute Rehabilitation Services Pager [phone]  Office [phone]   Demarquez Ciolek D Elonda Husky 12/25/2019, 2:58 PM

## 2019-12-25 NOTE — Progress Notes (Signed)
  Echocardiogram 2D Echocardiogram has been performed.  Evan Stanley 12/25/2019, 3:51 PM

## 2019-12-25 NOTE — Evaluation (Signed)
Occupational Therapy Evaluation Patient Details Name: Evan Stanley MRN: [id number] DOB: 30-Jul-1940 Today's Date: 12/25/2019    History of Present Illness Evan Stanley is a 79 y.o. male with medical history significant for hx of right brain stroke 2017 with residual left eye blindness, hyperlipidemia, coronary artery disease , left ventricular systolic dysfunction, total occlusion left ICA, RCEA, CKD stage 3b, DM type II who presents with concerns of recurrent fall and hurt his left knee. MRI showed several tiny acute cortical/subcortical infarcts within the posterior right frontal lobe and right parietal lobe.    Clinical Impression   PTA, pt was living at home with his wife, pt was independent with ADL/IADL and functional mobility. He began using the walker since last Wednesday and reported 3 falls. Pt currently requires minA for functional mobility at RW level, he required minA with intermittent modA for balance while standing at sink level during functional activity. Pt demonstrates cognitive and physical limitations impacting safety and independence with ADL/IADL and functional mobility. Pt with decreased awareness of deficits and decreased safety awareness. Due to decline in current level of function, pt would benefit from acute OT to address established goals to facilitate safe D/C to venue listed below. At this time, recommend CIR follow-up. Will continue to follow acutely.     Follow Up Recommendations  CIR    Equipment Recommendations  Other (comment)(tbd at next venue)    Recommendations for Other Services       Precautions / Restrictions Precautions Precautions: Fall Restrictions Weight Bearing Restrictions: No      Mobility Bed Mobility               General bed mobility comments: pt sitting EOB upon arrival  Transfers Overall transfer level: Needs assistance Equipment used: Rolling walker (2 wheeled) Transfers: Sit to/from Stand Sit to Stand: Min  assist         General transfer comment: minA for stability, vc for safe hand placement    Balance Overall balance assessment: Needs assistance Sitting-balance support: Single extremity supported;Feet supported Sitting balance-Leahy Scale: Poor Sitting balance - Comments: pt with intermittent instability and minor LOB sitting EOB, minor LOB when pt was concentrating on functional task, pt able to self-correct Postural control: Posterior lean;Left lateral lean Standing balance support: Single extremity supported;During functional activity Standing balance-Leahy Scale: Poor Standing balance comment: minA primarily with intermittent modA for support standing while completing grooming task, pt with increased left lateral lean and posterior lean when completing functional task requiring cues to correct posture                           ADL either performed or assessed with clinical judgement   ADL Overall ADL's : Needs assistance/impaired Eating/Feeding: Set up;Sitting   Grooming: Minimal assistance;Standing Grooming Details (indicate cue type and reason): minA for support while standing at sink level to complete grooming Upper Body Bathing: Set up;Sitting   Lower Body Bathing: Minimal assistance;Sit to/from stand   Upper Body Dressing : Set up;Sitting   Lower Body Dressing: Minimal assistance;Sit to/from stand   Toilet Transfer: Minimal assistance;Ambulation;RW Toilet Transfer Details (indicate cue type and reason): in room mobility Toileting- Clothing Manipulation and Hygiene: Minimal assistance;Sit to/from stand       Functional mobility during ADLs: Minimal assistance;Rolling walker General ADL Comments: pt limited by cognition, decreased activity tolerance, LLE weakness     Vision Baseline Vision/History: Legally blind(in left eye) Patient Visual Report: No change from  baseline Vision Assessment?: (pt with visual impairment of [place], no change from baseline)      Perception Perception Perception Tested?: No   Praxis      Pertinent Vitals/Pain Pain Assessment: Faces Faces Pain Scale: Hurts little more Pain Location: left knee Pain Descriptors / Indicators: Grimacing Pain Intervention(s): Limited activity within patient's tolerance;Monitored during session     Hand Dominance Right   Extremity/Trunk Assessment Upper Extremity Assessment Upper Extremity Assessment: LUE deficits/detail LUE Deficits / Details: previous rotator cuff injury (shoulder flexion about 80*) strength grossly 4/5;RUE 5/5; decreased rapid alternating movement speed, decreased coordination thumb to finger opposition;otherwise WFL LUE Sensation: WNL LUE Coordination: decreased fine motor   Lower Extremity Assessment Lower Extremity Assessment: Defer to PT evaluation   Cervical / Trunk Assessment Cervical / Trunk Assessment: Normal   Communication Communication Communication: HOH   Cognition Arousal/Alertness: Awake/alert Behavior During Therapy: WFL for tasks assessed/performed Overall Cognitive Status: Impaired/Different from baseline Area of Impairment: Attention;Safety/judgement;Awareness;Problem solving                   Current Attention Level: Selective     Safety/Judgement: Decreased awareness of safety;Decreased awareness of deficits Awareness: Emergent Problem Solving: Slow processing;Decreased initiation;Requires verbal cues;Requires tactile cues General Comments: pt distracted in minimally distracting environment, when focusing on task pt with decreased balance and required increased effort for postural control;pt requiring increased time and multiple cues to correctly recall and state number for emergency;pt appears to cover up deficits with humor;pt demonstrates poor safety awareness and requires cues for safe use of DME;pt became slightly agitated towards end of session when therapist provided cues on safe positioning, hand placement and use  of RW before sitting   General Comments  pt's son and wife present during session    Exercises     Shoulder Instructions      Home Living Family/patient expects to be discharged to:: Private residence Living Arrangements: Spouse/significant other Available Help at Discharge: Family;Available 24 hours/day Type of Home: House Home Access: Stairs to enter [employer] of Steps: 1 Entrance Stairs-Rails: None Home Layout: Laundry or work area in basement     [employer] Shower/Tub: Occupational psychologist: Standard Bathroom Accessibility: Yes How Accessible: Accessible via walker Home Equipment: Environmental consultant - 2 wheels   Additional Comments: [place] to see friends and walk the course. reports fall in june at golf course chipping on an incline      Prior Functioning/Environment Level of Independence: Independent with assistive device(s)        Comments: using the walker for ambulation since Wednesday, falls x3        OT Problem List: Decreased activity tolerance;Impaired balance (sitting and/or standing);Decreased safety awareness;Decreased knowledge of precautions;Pain;Decreased cognition;Decreased knowledge of use of DME or AE;Decreased coordination;Impaired vision/perception      OT Treatment/Interventions: Self-care/ADL training;Therapeutic exercise;Energy conservation;DME and/or AE instruction;Therapeutic activities;Cognitive remediation/compensation;Patient/family education;Balance training;Visual/perceptual remediation/compensation    OT Goals(Current goals can be found in the care plan section) Acute Rehab OT Goals Patient Stated Goal: to get back to baseline OT Goal Formulation: With patient Time For Goal Achievement: 01/08/20 Potential to Achieve Goals: Good ADL Goals Pt Will Perform Grooming: with modified independence;standing Pt Will Perform Lower Body Dressing: with modified independence;sit to/from stand Pt Will  Transfer to Toilet: with modified independence;ambulating Additional ADL Goal #1: Pt will demonstrate anticipatory awareness for safe engagement in ADL and functional mobility.  OT Frequency: Min 2X/week   Barriers to D/C: Decreased  caregiver support  pt's wife reports she does not feel comfortable assisting pt       Co-evaluation              AM-PAC OT "6 Clicks" Daily Activity     Outcome Measure Help from another person eating meals?: A Little Help from another person taking care of personal grooming?: A Little Help from another person toileting, which includes using toliet, bedpan, or urinal?: A Little Help from another person bathing (including washing, rinsing, drying)?: A Little Help from another person to put on and taking off regular upper body clothing?: A Little Help from another person to put on and taking off regular lower body clothing?: A Little 6 Click Score: 18   End of Session Equipment Utilized During Treatment: Gait belt;Rolling walker Nurse Communication: Mobility status;Other (comment)(no chair alarm, RN aware)  Activity Tolerance: Patient tolerated treatment well Patient left: in chair;with call bell/phone within reach;with family/visitor present  OT Visit Diagnosis: Unsteadiness on feet (R26.81);Other abnormalities of gait and mobility (R26.89);Muscle weakness (generalized) (M62.81);History of falling (Z91.81);Other symptoms and signs involving cognitive function;Pain Pain - Right/Left: Left Pain - part of body: Knee                Time: 8250-5397 OT Time Calculation (min): 35 min Charges:  OT General Charges $OT Visit: 1 Visit OT Evaluation $OT Eval Moderate Complexity: 1 Mod OT Treatments $Self Care/Home Management : 8-22 mins  Helene Kelp OTR/L Acute Rehabilitation Services Office: [phone]   Wyn Forster 12/25/2019, 1:04 PM

## 2019-12-25 NOTE — Progress Notes (Signed)
STROKE TEAM PROGRESS NOTE   INTERVAL HISTORY His wife and son are at the bedside.  He is up in the chair at the bedside. He reports he fell daily since Wed. No hx of falls previous to this.  Patient states he is not aware that he has atrial fibrillation but it is documented in his chart.  It is unclear to me as to why he has not been on anticoagulation.  He still has persistent mild left leg weakness.  MRI scan of the brain shows small punctate right medial frontal infarct.  MRA of the neck shows chronic left ICA and right V4 vertebral artery occlusions.  Hemoglobin A1c 7.4.  LDL cholesterol 47 mg percent.  Echocardiogram is pending.     Vitals:   12/24/19 2200 12/24/19 2218 12/25/19 0532 12/25/19 0754  BP: 124/80 (!) 143/71 122/60 108/68  Pulse: 68 85 64 65  Resp: 18 18 18 18   Temp: 98.5 F (36.9 C) 98.4 F (36.9 C) 98.3 F (36.8 C) 97.6 F (36.4 C)  TempSrc:  Oral Oral Oral  SpO2: 100% 100%  98%  Weight:  75.1 kg    Height:  5' 5"  (1.651 m)      CBC:  Recent Labs  Lab 12/21/19 1944 12/21/19 1944 12/24/19 1350 12/24/19 1424  WBC 9.4  --  9.5  --   NEUTROABS 6.0  --  7.0  --   HGB 14.8   < > 15.5 16.3  HCT 47.0   < > 48.5 48.0  MCV 85.3  --  82.8  --   PLT 176  --  207  --    < > = values in this interval not displayed.    Basic Metabolic Panel:  Recent Labs  Lab 12/21/19 1944 12/21/19 1944 12/24/19 1350 12/24/19 1424  NA 140   < > 134* 137  K 3.9   < > 3.9 3.9  CL 103   < > 101 98  CO2 27  --  25  --   GLUCOSE 115*   < > 179* 177*  BUN 39*   < > 33* 34*  CREATININE 2.10*   < > 1.85* 1.90*  CALCIUM 8.9  --  9.2  --    < > = values in this interval not displayed.   Lipid Panel:     Component Value Date/Time   CHOL 89 12/25/2019 0257   CHOL 100 08/23/2017 0850   TRIG 60 12/25/2019 0257   HDL 30 (L) 12/25/2019 0257   HDL 32 (L) 08/23/2017 0850   CHOLHDL 3.0 12/25/2019 0257   VLDL 12 12/25/2019 0257   LDLCALC 47 12/25/2019 0257   LDLCALC 49 08/23/2017 0850    HgbA1c:  Lab Results  Component Value Date   HGBA1C 7.4 (H) 12/25/2019   Urine Drug Screen:     Component Value Date/Time   LABOPIA NONE DETECTED 05/13/2019 2045   COCAINSCRNUR NONE DETECTED 05/13/2019 2045   LABBENZ NONE DETECTED 05/13/2019 2045   AMPHETMU NONE DETECTED 05/13/2019 2045   THCU NONE DETECTED 05/13/2019 2045   LABBARB NONE DETECTED 05/13/2019 2045    Alcohol Level     Component Value Date/Time   ETH <10 05/13/2019 1645    IMAGING past 24 hours CT HEAD WO CONTRAST  Result Date: 12/24/2019 CLINICAL DATA:  Unsteady on feet, off balance since Wednesday, intermittent dizziness, fell on Thursday and Friday, possible stroke; history stroke, atrial fibrillation, hypertension, CHF, diabetes mellitus, former smoker EXAM: CT HEAD WITHOUT CONTRAST  TECHNIQUE: Contiguous axial images were obtained from the base of the skull through the vertex without intravenous contrast. Sagittal and coronal MPR images reconstructed from axial data set. COMPARISON:  12/21/2019 FINDINGS: Brain: Generalized atrophy. Normal ventricular morphology. No midline shift or mass effect. Small vessel chronic ischemic changes of deep cerebral white matter. No intracranial hemorrhage, mass lesion, evidence of acute infarction, or extra-axial fluid collection. Vascular: Atherosclerotic calcification of internal carotid and vertebral arteries at skull base. No hyperdense vessels. Skull: Intact Sinuses/Orbits: Clear Other: N/A IMPRESSION: Atrophy with small vessel chronic ischemic changes of deep cerebral white matter. No acute intracranial abnormalities. Electronically Signed   By: Lavonia Dana M.D.   On: 12/24/2019 14:15   MR ANGIO HEAD WO CONTRAST  Result Date: 12/25/2019 CLINICAL DATA:  Stroke follow-up EXAM: MRA HEAD WITHOUT CONTRAST TECHNIQUE: Angiographic images of the Circle of Willis were obtained using MRA technique without intravenous contrast. COMPARISON:  MRA of the neck 05/15/2019 FINDINGS: Chronically  occluded left ICA in the neck with faint flow in the supraclinoid ICA and MCA branches. The left A1 segment is hypoplastic but patent. No downstream branch occlusion is seen. Robust flow in the right carotid siphon and downstream branches. Chronic right V4 segment occlusion. Robust flow in the left vertebral artery. The dominant bilateral cerebellar arteries are patent and enhancing. There is an apparent filling defect in the central mid to distal basilar artery which is considered artifactual given preceding neck MRA with contrast appearance which shows complete filling of this vessel. Moderate proximal basilar and right P2 segment narrowing. IMPRESSION: 1. No emergent finding. 2. Chronic cervical left ICA and right V4 segment occlusion. 3. Moderate proximal basilar and right P2 segment stenosis. Electronically Signed   By: Monte Fantasia M.D.   On: 12/25/2019 08:40   MR ANGIO NECK W WO CONTRAST  Result Date: 12/24/2019 CLINICAL DATA:  Follow-up examination for acute stroke. EXAM: MRA NECK WITHOUT AND WITH CONTRAST TECHNIQUE: Multiplanar and multiecho pulse sequences of the neck were obtained without and with intravenous contrast. Angiographic images of the neck were obtained using MRA technique without and with intravenous contrast. CONTRAST:  7.29m GADAVIST GADOBUTROL 1 MMOL/ML IV SOLN COMPARISON:  Prior brain MRI from earlier the same day as well as previous MRI from 05/15/2019. FINDINGS: AORTIC ARCH: Examination moderately degraded by motion artifact. Visualized aortic arch of normal caliber with normal 3 vessel morphology. No flow-limiting stenosis seen about the origin of the great vessels. RIGHT CAROTID SYSTEM: Right CCA patent from its origin to the bifurcation without flow-limiting stenosis. No significant atheromatous narrowing about the right bifurcation. Right ICA tortuous and widely patent distally to the circle-of-Willis without occlusion or other abnormality. Chronic severe stenosis at the origin  of the right ECA partially visualize, similar to previous. LEFT CAROTID SYSTEM: Left CCA patent from its origin to the bifurcation without stenosis. Chronic left ICA occlusion at its origin, which remains occluded to the level of the siphon. Moderate to severe stenosis at the origin of the left ECA noted as well. VERTEBRAL ARTERIES: Both vertebral arteries arise from the subclavian arteries. No visible proximal subclavian stenosis. Moderate stenosis at the distal left subclavian artery noted (series 1037, image 9). Vertebral arteries widely patent within the neck, with the left vertebral slightly dominant. Chronic conclusion of the intracranial right V4 segment. IMPRESSION: 1. No acute vascular abnormality within the neck. 2. Chronic left ICA occlusion. 3. Chronic distal right V4 segment occlusion. 4. Chronic severe stenosis at the origin of the right ECA, with  moderate to severe stenosis at the origin of the left ECA. 5. Moderate distal left subclavian artery stenosis. Electronically Signed   By: Jeannine Boga M.D.   On: 12/24/2019 23:22   MR BRAIN WO CONTRAST  Result Date: 12/24/2019 CLINICAL DATA:  Unsteady gait, 3 fall; ataxia, stroke suspected. Additional provided: Intermittent dizziness, left knee pain, several recent falls. EXAM: MRI HEAD WITHOUT CONTRAST TECHNIQUE: Multiplanar, multiecho pulse sequences of the brain and surrounding structures were obtained without intravenous contrast. COMPARISON:  Prior head CT examinations 12/24/2019 and earlier, brain MRI 05/15/2019. CT angiogram neck 11/02/2014. FINDINGS: Brain: There are few tiny cortical/subcortical foci of restricted diffusion within the posterior right frontal lobe and right parietal lobe consistent with acute infarcts (for instance as seen on series 3, images 41, 38, 37). Redemonstrated advanced generalized parenchymal atrophy. As before, there are multiple chronic lacunar infarcts within the bilateral cerebral white matter. Otherwise mild  chronic small vessel ischemic disease. No evidence of intracranial mass. No chronic intracranial blood products. No extra-axial fluid collection. No midline shift. Vascular: Signal abnormality within the V4 right vertebral artery consistent with known, chronic occlusion. Signal abnormality within the visualized distal cervical left ICA and left ICA siphon consistent with known chronic occlusion. As before there is reconstitution of the expected flow void within the paraclinoid left ICA. Skull and upper cervical spine: No focal marrow lesion. Sinuses/Orbits: Visualized orbits show no acute finding. Mild ethmoid sinus mucosal thickening. No significant mastoid effusion. IMPRESSION: Several tiny acute cortical/subcortical infarcts within the posterior right frontal lobe and right parietal lobe. As before, there are multiple chronic lacunar infarcts within the bilateral cerebral white matter. Otherwise mild chronic small vessel ischemic disease. Stable, advanced generalized parenchymal atrophy. Signal abnormality consistent with known chronic occlusion of the V4 right vertebral artery, distal cervical left ICA and left ICA siphon. As before, there is reconstitution of the expected flow void within the paraclinoid left ICA. Mild ethmoid sinus mucosal thickening. Electronically Signed   By: Kellie Simmering DO   On: 12/24/2019 18:34   DG Knee Complete 4 Views Left  Result Date: 12/24/2019 CLINICAL DATA:  Fall.  Swelling. EXAM: LEFT KNEE - COMPLETE 4+ VIEW COMPARISON:  None. FINDINGS: There are moderate degenerative changes involving the LATERAL, MEDIAL, and patellofemoral compartments. Small joint effusion is present. No acute fracture. There is atherosclerotic calcification of the popliteal artery. IMPRESSION: Degenerative changes and small joint effusion. Electronically Signed   By: Nolon Nations M.D.   On: 12/24/2019 17:07    PHYSICAL EXAM Pleasant elderly Caucasian male not in distress. . Afebrile. Head is  nontraumatic. Neck is supple without bruit.    Cardiac exam no murmur or gallop. Lungs are clear to auscultation. Distal pulses are well felt. Neurological Exam ;  Awake  Alert oriented x 3. Normal speech and language.eye movements full without nystagmus.fundi were not visualized. Vision acuity and fields appear normal. Hearing is normal. Palatal movements are normal. Face symmetric. Tongue midline. Normal strength, tone, reflexes and coordination except mild left lower extremity drift and mild weakness of left hip flexors and ankle dorsiflexors.. Normal sensation. Gait deferred.  ASSESSMENT/PLAN Mr. Jahaad Penado is a 79 y.o. male with history of right carotid dissection with small strokes affecting the right hemisphere without any residual deficits, listed atrial fibrillation in problem list but not on anticoagulation, chronically occluded left ICA, diabetes, hypertension, hyperlipidemia, blindness in the left eye, CAD status post CABG with AVR, peripheral vascular disease presenting with recurrent falls since Wed with altered gait. Marland Kitchen  Stroke:   Several R frontal lobe and R parietal lobe cortical / subcortical infarcts in setting of multiple chronic intracranial stenoses/ occlusion, current infarcts felt to be  embolic secondary to unclear source. Possible AF, formal dx unclear.   CT head No acute abnormality. Small vessel disease. Atrophy.   MRI  Several tiny cortical / subcortical infarcts posterior R frontal lobe and R parietal lobe. Multiple chronic lacunes B white matter. Small vessel disease.   MRA Head Known R V4, L ICA and L ICA siphon occlusion. Reconstitution paraclinoid L ICA. Sinus dz  MRA neck chronic L ICA, distal R V4 occlusions. R ECA origin severe stenosis. origin L ECA moderate to severe stenosis. Distal L subclavian moderate stenosis.   2D Echo pending   LDL 47  HgbA1c 7.4  Lovenox 40 mg sq daily for VTE prophylaxis  aspirin 81 mg daily prior to admission, now on  aspirin 81 mg daily.  Recommend change to Eliquis  Therapy recommendations:  CIR  Disposition:  pending   ? AF hx   AF listed in problem list but not in any cardiology notes  No EKGs in epic w/ documented AF  Multiple incidences of pt's refusal of AC, but no documentation of actual AF  ? AF post valve replacement. Not mentioned in d/c summary   No confirmed dx AF  Carotid disease  Right Carotid stenosis > 80% s/p R CEA 12/2013 Dr. Winona Legato ECA occluded  L ICA occluded  Hypertension  Stable . Permissive hypertension (OK if < 220/120) but gradually normalize in 5-7 days . Long-term BP goal normotensive  Hyperlipidemia  Home meds:  lipitor 40, resumed in hospital  LDL 47, goal < 70  Continue statin at discharge  Diabetes type II Uncontrolled  HgbA1c 7.4, goal < 7.0  Other Stroke Risk Factors  Advanced age  Former Cigarette smoker, quit 33 yrs ago  Hx stroke/TIA  11/2015 - Bi-cerebral infarcts right greater than left s/p diagnostic carotid arteriogram by vascular surgeon with R ICA > 80% and L ICA occlusion. Presented w/ L HP. Treated w/ aspirin, statin. Known dx AF at that time, pt refused AC.    Family hx stroke (father)  CAD s/p CABG  Hx Congestive heart failure  Known carotid occlusion   S/p AV replacement w/ bioprosthetic valve  Other Active Problems  Hx L CRAO noted during laser surgery 10/2015 w/ Dr. Zadie Rhine  L knee pain  CKD stage IIIb  Hospital day # 1  He has presented with left leg weakness due to embolic right medial frontal ACA branch infarct.  There is remote history of atrial fibrillation but not clearly documented in the chart.  Recommend cardiology consult to verify A. fib history and if confirmed recommend anticoagulation with Eliquis.  Continue ongoing stroke work-up.  Aggressive risk factor modification.  Long discussion with patient, wife and son and answered questions.  Discussed with Dr.Adhikari. Greater than 50% time during this  35-minute visit was spent on counseling and coordination of care about embolic stroke and stroke prevention and answering questions. [name], MD Medical Director [facility] Pager: [phone] 12/25/2019 5:50 PM To contact Stroke Continuity provider, please refer to [email or web address] After hours, contact General Neurology

## 2019-12-25 NOTE — Consult Note (Signed)
Physical Medicine and Rehabilitation Consult   Reason for Consult: Stroke with functional decline Referring Physician: Dr. Jacquelin Hawking   HPI: Evan Stanley is a 79 y.o. male with history of bioprosthetic aortic valve, CAD,  CKD, T2DM, PAD, R-CVA with left visual loss, who was admitted on 06/06/21with recurrent falls and weakness. Patient reported multiple falls for 3 days as well as an injury to his left knee with fall day PTA.  CT head negative. Left knee films negative for injury. MRI brain revealed several tiny acute cortical/subacute cortical infarcts within posterior right frontal and right parietal lobe, chronic multiple lacunar infarcts and stable advanced parenchymal atrophy.  MRA revealed chronic L-ICA occlusion , chronic distal right V4 segment occlusion,  chronic severe stenosis origin of right ECA, moderate to severe stenosis origin of left ECA and moderate distal L-SA stenosis.  2D echo showed EF of 65 to 70% with moderate LVH, trivial MVR and bioprosthetic aortic valve.  Patient with history of PAD and questionable history of A. fib in the past.  Stroke felt to be embolic secondary to unknown source and cardiology was consulted for input on anticoagulation.  Therapy evaluations completed revealing unsteady gait with posterior and left lateral lean, LLE weakness with poor awareness of deficits affecting ADLs and mobility.  CIR recommended due to functional decline   Review of Systems  Constitutional: Negative for chills and fever.       Legs get weak/mind gets weak.   HENT: Negative for hearing loss and tinnitus.   Eyes:       Blind in left eye  Respiratory: Negative for cough and shortness of breath.   Cardiovascular: Negative for chest pain, palpitations and leg swelling.  Gastrointestinal: Negative for abdominal pain, heartburn and nausea.  Musculoskeletal: Positive for falls and joint pain (chronic left shoulder pain).  Skin: Negative for rash.  Neurological: Positive  for focal weakness. Negative for dizziness and headaches.  Psychiatric/Behavioral: The patient does not have insomnia.      Past Medical History:  Diagnosis Date  . AKI (acute kidney injury) ([place])   . Allergy   . Anxiety   . Arthritis    Right shoulder  . Atrial fibrillation ([place])   . Carotid artery stenosis   . Cataract   . CHF (congestive heart failure) ([place])   . CVA (cerebral vascular accident) ([place]) 04/28/2016  . Decreased vision    left eye  . Diabetes mellitus without complication ([place])    Takes Metformin  . Hyperlipidemia   . Hypertension   . Salmonella bacteremia 04/29/2016  . Sepsis ([place]) 04/29/2016  . Stroke ([place]) 11/18/2015   no deficits  . TIA (transient ischemic attack)    affected left eye  . Urgency of urination     Past Surgical History:  Procedure Laterality Date  . AORTIC VALVE REPLACEMENT    . BACK SURGERY  80s  . CARDIAC VALVE REPLACEMENT  11/01/2008   21-mm Edwards Pericardial Magna - Ease valve  . ENDARTERECTOMY Right 01/13/2016   Procedure: ENDARTERECTOMY CAROTID - RIGHT;  Surgeon: Conrad Lesslie, MD;  Location: [facility];  Service: Vascular;  Laterality: Right;  . EYE SURGERY    . laser surgery, left eye  Left 10-29-2015    retinal surgery/ Deloria Lair MD   . PERIPHERAL VASCULAR CATHETERIZATION Right 11/18/2015   Procedure: Carotid Angiography;  Surgeon: Conrad Morningside, MD;  Location: [facility];  Service: Cardiovascular;  Laterality: Right;  . PERIPHERAL VASCULAR CATHETERIZATION  N/A 11/18/2015   Procedure: Aortic Arch Angiography;  Surgeon: Conrad [place] [name], MD;  Location: [facility];  Service: Cardiovascular;  Laterality: N/A;  . TEE WITHOUT CARDIOVERSION N/A 05/05/2016   Procedure: TRANSESOPHAGEAL ECHOCARDIOGRAM (TEE);  Surgeon: Lelon Perla, MD;  Location: [facility] ENDOS[facility];  Service: Cardiovascular;  Laterality: N/A;    Family History  Problem Relation Age of Onset  . Heart disease Mother   . Cancer Mother        breast  . Stroke Father  37  . Hypertension Father   . Early death Sister        infant death  . Heart disease Brother   . Hydrocephalus Brother   . Heart disease Brother   . Diabetes Brother   . Cancer Brother        prostat  . Healthy Son   . Healthy Son     Social History: Married. Independent PTA--retired from [employer]. Sedentary. He reports that he quit smoking about 33 years ago. His smoking use included cigarettes. He has a 40.00 pack-year smoking history. He has never used smokeless tobacco. He reports that he does not drink alcohol or use drugs.    Allergies: No Known Allergies    Medications Prior to Admission  Medication Sig Dispense Refill  . aspirin EC 81 MG tablet Take 1 tablet (81 mg total) by mouth daily. 90 tablet 3  . atorvastatin (LIPITOR) 40 MG tablet TAKE 1 TABLET BY MOUTH  DAILY (Patient taking differently: Take 40 mg by mouth at bedtime. ) 90 tablet 1  . atropine 1 % ophthalmic solution Place 1 drop into the left eye 3 (three) times daily as needed (eye pain).    . cetirizine (ZYRTEC) 10 MG tablet Take 10 mg by mouth daily.    . Dulaglutide (TRULICITY) 1.5 GT/3.6IW SOPN Inject 1.5 mg into the skin once a week. (Patient taking differently: Inject 1.5 mg into the skin every Sunday. ) 6 mL 0  . hydrochlorothiazide (HYDRODIURIL) 25 MG tablet Take 1 tablet (25 mg total) by mouth daily. 90 tablet 1  . ibuprofen (ADVIL) 200 MG tablet Take 400-600 mg by mouth every 6 (six) hours as needed (pain).    . insulin degludec (TRESIBA FLEXTOUCH) 200 UNIT/ML FlexTouch Pen Inject 50 Units into the skin daily.    . insulin lispro (HUMALOG) 100 UNIT/ML injection Inject 6 Units into the skin daily before supper.     . latanoprost (XALATAN) 0.005 % ophthalmic solution Place 1 drop into the left eye at bedtime.     Marland Kitchen lisinopril (ZESTRIL) 40 MG tablet Take 1 tablet (40 mg total) by mouth daily. 90 tablet 1  . metoprolol succinate (TOPROL-XL) 100 MG 24 hr tablet Take with or immediately following a meal. (Patient  taking differently: Take 100 mg by mouth daily. Take with or immediately following a meal.) 90 tablet 1  . vitamin C (ASCORBIC ACID) 500 MG tablet Take 500 mg by mouth daily.    . vitamin E (VITAMIN E) 1000 UNIT capsule Take 1,000 Units by mouth daily.    Marland Kitchen amLODipine (NORVASC) 10 MG tablet TAKE 1 TABLET BY MOUTH  DAILY (Patient not taking: Reported on 12/24/2019) 90 tablet 1  . Lancets (ONETOUCH ULTRASOFT) lancets Test BS BID Dx E11.9 200 each 3  . ONETOUCH ULTRA test strip USE TO CHECK BLOOD SUGAR  TWICE DAILY 200 strip 3    Home: Home Living Family/patient expects to be discharged to:: Private residence Living Arrangements: Spouse/significant other  Available Help at Discharge: Family, Available 24 hours/day Type of Home: House Home Access: Stairs to enter [employer] of Steps: 1 Entrance Stairs-Rails: None Home Layout: Laundry or work area in basement [employer] Shower/Tub: Multimedia programmer: Standard Bathroom Accessibility: Yes Home Equipment: Environmental consultant - 2 wheels Additional Comments: drives [place] weekly to see friends and walk the course. reports fall in june at golf course chipping on an incline  Functional History: Prior Function Level of Independence: Independent with assistive device(s) Comments: using the walker for ambulation since Wednesday, falls x3. Prior to new onset balance deficits and falls, pt ambulating without AD, playing golf with friends. Functional Status:  Mobility: Bed Mobility Overal bed mobility: Needs Assistance General bed mobility comments: up in chair upon PT arrival, sitting EOB with OT entering room PT exit Transfers Overall transfer level: Needs assistance Equipment used: Rolling walker (2 wheeled) Transfers: Sit to/from Stand Sit to Stand: Min assist General transfer comment: Min assist for initial power up, steadying. Verbal cuing for hand placement when rising, initially requests for PT to hold RW to steady it  so he could pull up on it but PT educated pt on safety issues with this technique. Ambulation/Gait Ambulation/Gait assistance: Min assist Gait Distance (Feet): 30 Feet Assistive device: Rolling walker (2 wheeled) Gait Pattern/deviations: Step-through pattern, Decreased stride length, Trunk flexed, Decreased stance time - left, Decreased weight shift to left, Antalgic General Gait Details: Min assist to steady, block L knee buckling intermittently during gait. Pt with antalgic gait secondary to hitting L knee during fall. verbal cuing for sequencing, placement in RW, keeping RW with him during transitional movements (turning especially). Gait velocity: decr    ADL: ADL Overall ADL's : Needs assistance/impaired Eating/Feeding: Set up, Sitting Grooming: Minimal assistance, Standing Grooming Details (indicate cue type and reason): minA for support while standing at sink level to complete grooming Upper Body Bathing: Set up, Sitting Lower Body Bathing: Minimal assistance, Sit to/from stand Upper Body Dressing : Set up, Sitting Lower Body Dressing: Minimal assistance, Sit to/from stand Toilet Transfer: Minimal assistance, Ambulation, RW Toilet Transfer Details (indicate cue type and reason): in room mobility Toileting- Clothing Manipulation and Hygiene: Minimal assistance, Sit to/from stand Functional mobility during ADLs: Minimal assistance, Rolling walker General ADL Comments: pt limited by cognition, decreased activity tolerance, LLE weakness  Cognition: Cognition Overall Cognitive Status: Impaired/Different from baseline Orientation Level: Oriented X4 Cognition Arousal/Alertness: Awake/alert Behavior During Therapy: WFL for tasks assessed/performed Overall Cognitive Status: Impaired/Different from baseline Area of Impairment: Attention, Safety/judgement, Awareness, Problem solving, Following commands Current Attention Level: Selective Following Commands: Follows multi-step commands  with increased time Safety/Judgement: Decreased awareness of safety, Decreased awareness of deficits Awareness: Emergent Problem Solving: Slow processing, Decreased initiation, Requires verbal cues, Requires tactile cues General Comments: Pt A&Ox4, requires multimodal cuing for safety during mobility especially during transitional movements (sit<>stand, turning). Looks to wife to answer some subjective questions  Blood pressure 108/68, pulse 65, temperature 97.6 F (36.4 C), temperature source Oral, resp. rate 18, height 5' 5"  (1.651 m), weight 75.1 kg, SpO2 98 %. Physical Exam  Nursing note and vitals reviewed. Constitutional: He is oriented to person, place, and time. He appears well-developed and well-nourished.  HENT:  Head: Normocephalic.  Eyes: Pupils are equal, round, and reactive to light.  Cardiovascular: Normal rate.  Respiratory: No respiratory distress.  GI: Soft.  Musculoskeletal:     Cervical back: Normal range of motion.     Comments: Left knee effusion, pain with  AROM/PROM. Marland Kitchen   Neurological: He is alert and oriented to person, place, and time.  RUE 5/5. LUE 4+/5. RLE 4+/5. LLE limited by knee pain, 4/5 ADF/PF. No sensory deficits. Normal insight and awareness. Speech/language intact  Skin:  Abrasion left knee    Results for orders placed or performed during the hospital encounter of 12/24/19 (from the past 24 hour(s))  SARS Coronavirus 2 by RT PCR (hospital order, performed in [facility] hospital lab) Nasopharyngeal Nasopharyngeal Swab     Status: None   Collection Time: 12/24/19  6:58 PM   Specimen: Nasopharyngeal Swab  Result Value Ref Range   SARS Coronavirus 2 NEGATIVE NEGATIVE  Hemoglobin A1c     Status: Abnormal   Collection Time: 12/25/19  2:57 AM  Result Value Ref Range   Hgb A1c MFr Bld 7.4 (H) 4.8 - 5.6 %   Mean Plasma Glucose 165.68 mg/dL  Lipid panel     Status: Abnormal   Collection Time: 12/25/19  2:57 AM  Result Value Ref Range   Cholesterol 89  0 - 200 mg/dL   Triglycerides 60 <150 mg/dL   HDL 30 (L) >40 mg/dL   Total CHOL/HDL Ratio 3.0 RATIO   VLDL 12 0 - 40 mg/dL   LDL Cholesterol 47 0 - 99 mg/dL   CT HEAD WO CONTRAST  Result Date: 12/24/2019 CLINICAL DATA:  Unsteady on feet, off balance since Wednesday, intermittent dizziness, fell on Thursday and Friday, possible stroke; history stroke, atrial fibrillation, hypertension, CHF, diabetes mellitus, former smoker EXAM: CT HEAD WITHOUT CONTRAST TECHNIQUE: Contiguous axial images were obtained from the base of the skull through the vertex without intravenous contrast. Sagittal and coronal MPR images reconstructed from axial data set. COMPARISON:  12/21/2019 FINDINGS: Brain: Generalized atrophy. Normal ventricular morphology. No midline shift or mass effect. Small vessel chronic ischemic changes of deep cerebral white matter. No intracranial hemorrhage, mass lesion, evidence of acute infarction, or extra-axial fluid collection. Vascular: Atherosclerotic calcification of internal carotid and vertebral arteries at skull base. No hyperdense vessels. Skull: Intact Sinuses/Orbits: Clear Other: N/A IMPRESSION: Atrophy with small vessel chronic ischemic changes of deep cerebral white matter. No acute intracranial abnormalities. Electronically Signed   By: Lavonia Dana M.D.   On: 12/24/2019 14:15   MR ANGIO HEAD WO CONTRAST  Result Date: 12/25/2019 CLINICAL DATA:  Stroke follow-up EXAM: MRA HEAD WITHOUT CONTRAST TECHNIQUE: Angiographic images of the Circle of Willis were obtained using MRA technique without intravenous contrast. COMPARISON:  MRA of the neck 05/15/2019 FINDINGS: Chronically occluded left ICA in the neck with faint flow in the supraclinoid ICA and MCA branches. The left A1 segment is hypoplastic but patent. No downstream branch occlusion is seen. Robust flow in the right carotid siphon and downstream branches. Chronic right V4 segment occlusion. Robust flow in the left vertebral artery. The  dominant bilateral cerebellar arteries are patent and enhancing. There is an apparent filling defect in the central mid to distal basilar artery which is considered artifactual given preceding neck MRA with contrast appearance which shows complete filling of this vessel. Moderate proximal basilar and right P2 segment narrowing. IMPRESSION: 1. No emergent finding. 2. Chronic cervical left ICA and right V4 segment occlusion. 3. Moderate proximal basilar and right P2 segment stenosis. Electronically Signed   By: Monte Fantasia M.D.   On: 12/25/2019 08:40   MR ANGIO NECK W WO CONTRAST  Result Date: 12/24/2019 CLINICAL DATA:  Follow-up examination for acute stroke. EXAM: MRA NECK WITHOUT AND WITH CONTRAST TECHNIQUE: Multiplanar  and multiecho pulse sequences of the neck were obtained without and with intravenous contrast. Angiographic images of the neck were obtained using MRA technique without and with intravenous contrast. CONTRAST:  7.72m GADAVIST GADOBUTROL 1 MMOL/ML IV SOLN COMPARISON:  Prior brain MRI from earlier the same day as well as previous MRI from 05/15/2019. FINDINGS: AORTIC ARCH: Examination moderately degraded by motion artifact. Visualized aortic arch of normal caliber with normal 3 vessel morphology. No flow-limiting stenosis seen about the origin of the great vessels. RIGHT CAROTID SYSTEM: Right CCA patent from its origin to the bifurcation without flow-limiting stenosis. No significant atheromatous narrowing about the right bifurcation. Right ICA tortuous and widely patent distally to the circle-of-Willis without occlusion or other abnormality. Chronic severe stenosis at the origin of the right ECA partially visualize, similar to previous. LEFT CAROTID SYSTEM: Left CCA patent from its origin to the bifurcation without stenosis. Chronic left ICA occlusion at its origin, which remains occluded to the level of the siphon. Moderate to severe stenosis at the origin of the left ECA noted as well.  VERTEBRAL ARTERIES: Both vertebral arteries arise from the subclavian arteries. No visible proximal subclavian stenosis. Moderate stenosis at the distal left subclavian artery noted (series 1037, image 9). Vertebral arteries widely patent within the neck, with the left vertebral slightly dominant. Chronic conclusion of the intracranial right V4 segment. IMPRESSION: 1. No acute vascular abnormality within the neck. 2. Chronic left ICA occlusion. 3. Chronic distal right V4 segment occlusion. 4. Chronic severe stenosis at the origin of the right ECA, with moderate to severe stenosis at the origin of the left ECA. 5. Moderate distal left subclavian artery stenosis. Electronically Signed   By: BJeannine BogaM.D.   On: 12/24/2019 23:22   MR BRAIN WO CONTRAST  Result Date: 12/24/2019 CLINICAL DATA:  Unsteady gait, 3 fall; ataxia, stroke suspected. Additional provided: Intermittent dizziness, left knee pain, several recent falls. EXAM: MRI HEAD WITHOUT CONTRAST TECHNIQUE: Multiplanar, multiecho pulse sequences of the brain and surrounding structures were obtained without intravenous contrast. COMPARISON:  Prior head CT examinations 12/24/2019 and earlier, brain MRI 05/15/2019. CT angiogram neck 11/02/2014. FINDINGS: Brain: There are few tiny cortical/subcortical foci of restricted diffusion within the posterior right frontal lobe and right parietal lobe consistent with acute infarcts (for instance as seen on series 3, images 41, 38, 37). Redemonstrated advanced generalized parenchymal atrophy. As before, there are multiple chronic lacunar infarcts within the bilateral cerebral white matter. Otherwise mild chronic small vessel ischemic disease. No evidence of intracranial mass. No chronic intracranial blood products. No extra-axial fluid collection. No midline shift. Vascular: Signal abnormality within the V4 right vertebral artery consistent with known, chronic occlusion. Signal abnormality within the visualized  distal cervical left ICA and left ICA siphon consistent with known chronic occlusion. As before there is reconstitution of the expected flow void within the paraclinoid left ICA. Skull and upper cervical spine: No focal marrow lesion. Sinuses/Orbits: Visualized orbits show no acute finding. Mild ethmoid sinus mucosal thickening. No significant mastoid effusion. IMPRESSION: Several tiny acute cortical/subcortical infarcts within the posterior right frontal lobe and right parietal lobe. As before, there are multiple chronic lacunar infarcts within the bilateral cerebral white matter. Otherwise mild chronic small vessel ischemic disease. Stable, advanced generalized parenchymal atrophy. Signal abnormality consistent with known chronic occlusion of the V4 right vertebral artery, distal cervical left ICA and left ICA siphon. As before, there is reconstitution of the expected flow void within the paraclinoid left ICA. Mild ethmoid sinus mucosal thickening.  Electronically Signed   By: Kellie Simmering DO   On: 12/24/2019 18:34   DG Knee Complete 4 Views Left  Result Date: 12/24/2019 CLINICAL DATA:  Fall.  Swelling. EXAM: LEFT KNEE - COMPLETE 4+ VIEW COMPARISON:  None. FINDINGS: There are moderate degenerative changes involving the LATERAL, MEDIAL, and patellofemoral compartments. Small joint effusion is present. No acute fracture. There is atherosclerotic calcification of the popliteal artery. IMPRESSION: Degenerative changes and small joint effusion. Electronically Signed   By: Nolon Nations M.D.   On: 12/24/2019 17:07   ECHOCARDIOGRAM COMPLETE  Result Date: 12/25/2019    ECHOCARDIOGRAM REPORT   Patient Name:   Evan Stanley [facility] Date of Exam: 12/25/2019 Medical Rec #:  [id number]        Height:       65.0 in Accession #:    [id number]       Weight:       165.6 lb Date of Birth:  [birth year]         BSA:          1.825 m Patient Age:    55 years         BP:           108/68 mmHg Patient Gender: M                HR:            65 bpm. Exam Location:  Inpatient Procedure: 2D Echo, Intracardiac Opacification Agent, Cardiac Doppler and Color            Doppler Indications:    Stroke  History:        Patient has prior history of Echocardiogram examinations, most                 recent 05/15/2019. Risk Factors:Diabetes and Dyslipidemia. CKD.                 S/p aortic valve replacement.  Sonographer:    [name] Lefort RDCS (AE) Referring Phys: [id number] Ladora T TU  Sonographer Comments: Technically difficult study due to poor echo windows, suboptimal parasternal window, suboptimal apical window and no subcostal window. IMPRESSIONS  1. Left ventricular ejection fraction, by estimation, is 65 to 70%. The left ventricle has normal function. The left ventricle has no regional wall motion abnormalities. There is moderate left ventricular hypertrophy. Left ventricular diastolic parameters are consistent with Grade I diastolic dysfunction (impaired relaxation). Elevated left ventricular end-diastolic pressure.  2. Right ventricular systolic function is low normal. The right ventricular size is normal.  3. The mitral valve is abnormal. Trivial mitral valve regurgitation.  4. The aortic valve has been repaired/replaced with a bioprosthetic valve. Aortic valve regurgitation is not visualized. Aortic valve area, by VTI measures 2.69 cm. Aortic valve mean gradient measures 8.0 mmHg. Peak gradient is 13.5 mmHg. Aortic valve Vmax measures 1.84 m/s. Comparison(s): 05/15/2019: LVEF 60-65%, aortic valve mean gradient 13 mmHg, peak gradient 24 mmHg. FINDINGS  Left Ventricle: Left ventricular ejection fraction, by estimation, is 65 to 70%. The left ventricle has normal function. The left ventricle has no regional wall motion abnormalities. Definity contrast agent was given IV to delineate the left ventricular  endocardial borders. The left ventricular internal cavity size was normal in size. There is moderate left ventricular hypertrophy. Left ventricular diastolic  parameters are consistent with Grade I diastolic dysfunction (impaired relaxation). Elevated left ventricular end-diastolic pressure. Right Ventricle: The right ventricular size is normal. No increase in right ventricular wall  thickness. Right ventricular systolic function is low normal. Left Atrium: Left atrial size was normal in size. Right Atrium: Right atrial size was normal in size. Pericardium: There is no evidence of pericardial effusion. Mitral Valve: The mitral valve is abnormal. There is mild thickening of the mitral valve leaflet(s). Mild to moderate mitral annular calcification. Trivial mitral valve regurgitation. MV peak gradient, 4.2 mmHg. The mean mitral valve gradient is 2.0 mmHg. Tricuspid Valve: The tricuspid valve is not well visualized. Tricuspid valve regurgitation is not demonstrated. Aortic Valve: The aortic valve has been repaired/replaced. Aortic valve regurgitation is not visualized. Aortic valve mean gradient measures 8.0 mmHg. There is a unknown bioprosthetic valve present in the aortic position. Pulmonic Valve: The pulmonic valve was not well visualized. Pulmonic valve regurgitation is not visualized. Aorta: The aortic root and ascending aorta are structurally normal, with no evidence of dilitation. Venous: The inferior vena cava was not well visualized. IAS/Shunts: No atrial level shunt detected by color flow Doppler.  LEFT VENTRICLE PLAX 2D LVIDd:         4.00 cm  Diastology LVIDs:         2.39 cm  LV e' lateral:   6.75 cm/s LV PW:         1.40 cm  LV E/e' lateral: 15.7 LV IVS:        1.25 cm  LV e' medial:    5.11 cm/s LVOT diam:     2.40 cm  LV E/e' medial:  20.7 LV SV:         84 LV SV Index:   46 LVOT Area:     4.52 cm  RIGHT VENTRICLE RV Basal diam:  2.98 cm RV S prime:     8.87 cm/s LEFT ATRIUM             Index       RIGHT ATRIUM           Index LA diam:        2.60 cm 1.42 cm/m  RA Area:     14.50 cm LA Vol (A2C):   50.2 ml 27.50 ml/m RA Volume:   33.40 ml  18.30 ml/m LA  Vol (A4C):   55.8 ml 30.57 ml/m LA Biplane Vol: 55.2 ml 30.24 ml/m  AORTIC VALVE AV Area (Vmax):    2.32 cm AV Area (Vmean):   2.37 cm AV Area (VTI):     2.69 cm AV Vmax:           184.00 cm/s AV Vmean:          121.000 cm/s AV VTI:            0.313 m AV Peak Grad:      13.5 mmHg AV Mean Grad:      8.0 mmHg LVOT Vmax:         94.20 cm/s LVOT Vmean:        63.500 cm/s LVOT VTI:          0.186 m LVOT/AV VTI ratio: 0.59  AORTA Ao Root diam: 2.30 cm MITRAL VALVE MV Area (PHT): 3.12 cm     SHUNTS MV Peak grad:  4.2 mmHg     Systemic VTI:  0.19 m MV Mean grad:  2.0 mmHg     Systemic Diam: 2.40 cm MV Vmax:       1.03 m/s MV Vmean:      55.9 cm/s MV Decel Time: 243 msec MV E velocity: 106.00 cm/s MV A velocity:  96.80 cm/s MV E/A ratio:  1.10 Lyman Bishop MD Electronically signed by Lyman Bishop MD Signature Date/Time: 12/25/2019/4:45:22 PM    Final      Assessment/Plan: Diagnosis: embolic right fronto-parietal infarcts, recent left knee injury? 1. Does the need for close, 24 hr/day medical supervision in concert with the patient's rehab needs make it unreasonable for this patient to be served in a less intensive setting? Yes 2. Co-Morbidities requiring supervision/potential complications: CAD, CKD, DM 3. Due to bladder management, bowel management, safety, skin/wound care, disease management, medication administration, pain management and patient education, does the patient require 24 hr/day rehab nursing? Yes 4. Does the patient require coordinated care of a physician, rehab nurse, therapy disciplines of PT, OT to address physical and functional deficits in the context of the above medical diagnosis(es)? Yes Addressing deficits in the following areas: balance, endurance, locomotion, strength, transferring, bowel/bladder control, bathing, dressing, feeding, grooming, toileting and psychosocial support 5. Can the patient actively participate in an intensive therapy program of at least 3 hrs of therapy per  day at least 5 days per week? Yes 6. The potential for patient to make measurable gains while on inpatient rehab is excellent 7. Anticipated functional outcomes upon discharge from inpatient rehab are modified independent  with PT, modified independent with OT, n/a with SLP. 8. Estimated rehab length of stay to reach the above functional goals is: 7 days 9. Anticipated discharge destination: Home 10. Overall Rehab/Functional Prognosis: excellent  RECOMMENDATIONS: This patient's condition is appropriate for continued rehabilitative care in the following setting: CIR Patient has agreed to participate in recommended program. Potentially Note that insurance prior authorization may be required for reimbursement for recommended care.  Comment: Patient and wife seem to want to go home. We discussed the merits of inpatient rehab for stroke patients. After reviewing today's therapy notes, I believe he can reach a modified independent functional level after a 7 day rehab admission. He has, however, made a lot of neurological progress it appears from yesterday to today.   Rehab Admissions Coordinator to follow up.  Thanks,  Meredith Staggers, MD, Mellody Drown  I have personally performed a face to face diagnostic evaluation of this patient. Additionally, I have examined pertinent labs and radiographic images. I have reviewed and concur with the physician assistant's documentation above.    12/26/2019   Evan Leriche, PA-C 12/25/2019

## 2019-12-25 NOTE — Progress Notes (Signed)
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  Last fall was about 1 days ago before admission.  On presentation, he was complaining of pain on left knee.  He was evaluated at [facility] hospital and had negative CT head and was discharged home.  Had a lot of difficulty with ambulation after discharge and decided to present to the emergency department again.  He uses walker for ambulation.  On presentation, he was hemodynamically stable.  MRI showed multiple acute cortical/subcortical infarcts.  Admitted for further stroke work-up.  Neurology  consulted and following.  Assessment & Plan:   Principal Problem:   Stroke [facility]) Active Problems:   Type 2 diabetes mellitus ([place])   Hyperlipidemia associated w/ type 2 DM goal <70   Occlusion and stenosis of right carotid artery   Left Carotid artery occlusion   History of aortic valve replacement   Stage 3 chronic kidney disease   Acute nonhemorrhagic stroke: MRI showed several tiny acute cortical/subcortical infarcts within the posterior right frontal lobe and right parietal lobe.multiple chronic lacunar infarcts within the bilateral cerebral white matter.chronic occlusion of the V4 right vertebral artery, distal cervical left ICA and left ICA siphon.  MRA neck showed Chronic left ICA occlusion,chronic distal right V4 segment occlusion,chronic severe stenosis at the origin of the right ECA, with moderate to severe stenosis at the origin of the left ECA,moderate distal left subclavian artery stenosis. MRA head showed chronic cervical left ICA and right  V4 segment occlusion,moderate proximal basilar and right P2 segment stenosis .   Echo is pending.  Hemoglobin A1c 7.4.  LDL of 47.  Currently on aspirin.  As per neurology, needs anticoagulation for a stroke prevention given his history of atrial fibrillation and recurrent strokes.  PT/OT/speech evaluation requested. He has history of previous stroke but currently fully functional before admission.    History of peripheral artery disease:Has significant peripheral artery disease with history of right carotid enterectomy and chronic left ICA and severe right ECA stenosis.  He currently only takes aspirin for his previous stroke.   Left knee pain: Most likely history with fall.  X-ray did not show any fracture dislocation but showed chronic degenerative disease.  CKD stage IIIb: Currently kidney function at baseline.  Diabetes type 2: Hemoglobin A1c of 5.8 is in February this year.  Increased to 7.4 now.  Takes [name], Humalog at home.  Currently on sliding scale insulin.  Monitor CBGs.  Hypertension: Continue current medications.  History of aortic valve repair/coronary artery  disease/stroke: On aspirin .       DVT prophylaxis:Lovenox Code Status: Full Family Communication: None present at the bedside Status is: Inpatient  Remains inpatient appropriate because:Ongoing diagnostic testing needed not appropriate for outpatient work up   Dispo: The patient is from: Home              Anticipated d/c is to: Home              Anticipated d/c date is: 1 day              Patient currently is not medically stable to d/c.  Consultants: Neurology  Procedures:None  Antimicrobials:  Anti-infectives (From admission, onward)   None      Subjective: Patient seen and examined at the bedside this morning.  Hemodynamically stable.  Comfortable.  Currently alert and oriented, no focal neurological deficits.  Denies any complaints besides pain on the left knee  Objective: Vitals:    12/24/19 2200 12/24/19 2218 12/25/19 0532 12/25/19 0754  BP: 124/80 (!) 143/71 122/60 108/68  Pulse: 68 85 64 65  Resp: 18 18 18 18   Temp: 98.5 F (36.9 C) 98.4 F (36.9 C) 98.3 F (36.8 C) 97.6 F (36.4 C)  TempSrc:  Oral Oral Oral  SpO2: 100% 100%  98%  Weight:  75.1 kg    Height:  5' 5"  (1.651 m)      Intake/Output Summary (Last 24 hours) at 12/25/2019 5726 Last data filed at 12/25/2019 0800 Gross per 24 hour  Intake 120 ml  Output 200 ml  Net -80 ml   Filed Weights   12/24/19 1336 12/24/19 2218  Weight: 73.9 kg 75.1 kg    Examination:  General exam: Pleasant elderly male, comfortable HEENT:PERRL,Oral mucosa moist, Ear/Nose normal on gross exam Respiratory system: Bilateral equal air entry, normal vesicular breath sounds, no wheezes or crackles  Cardiovascular system: S1 & S2 heard, RRR. No JVD, murmurs, rubs, gallops or clicks. No pedal edema. Gastrointestinal system: Abdomen is nondistended, soft and nontender. No organomegaly or masses felt. Normal bowel sounds heard. Central nervous system: Alert and oriented. No focal neurological deficits. Extremities: No edema, no clubbing ,no cyanosis. Skin: No rashes, lesions or ulcers,no icterus ,no pallor MSK: Normal muscle bulk,tone ,power Psychiatry: Judgement and insight appear normal. Mood & affect appropriate.     Data Reviewed: I have personally reviewed following labs and imaging studies  CBC: Recent Labs  Lab 12/21/19 1944 12/24/19 1350 12/24/19 1424  WBC 9.4 9.5  --   NEUTROABS 6.0 7.0  --   HGB 14.8 15.5 16.3  HCT 47.0 48.5 48.0  MCV 85.3 82.8  --   PLT 176 207  --    Basic Metabolic Panel: Recent Labs  Lab 12/21/19 1944 12/24/19 1350 12/24/19 1424  NA 140 134* 137  K 3.9 3.9 3.9  CL 103 101 98  CO2 27 25  --   GLUCOSE 115* 179* 177*  BUN 39* 33* 34*  CREATININE 2.10* 1.85* 1.90*  CALCIUM 8.9 9.2  --    GFR: Estimated Creatinine Clearance: 31.3 mL/min (A) (by C-G formula based on SCr of 1.9  mg/dL (H)). Liver Function Tests: Recent Labs  Lab 12/21/19 1944 12/24/19 1350  AST 18 18  ALT 21 18  ALKPHOS 91 80  BILITOT 0.3 1.0  PROT 6.3* 6.7  ALBUMIN 3.9 3.6   No results for input(s): LIPASE, AMYLASE in the last 168 hours. No results for input(s): AMMONIA in the last 168 hours. Coagulation Profile: Recent Labs  Lab 12/24/19 1350  INR 1.1   Cardiac Enzymes: No results for input(s): CKTOTAL, CKMB, CKMBINDEX, TROPONINI in the last 168 hours. BNP (last 3 results) No results for input(s): PROBNP in the last 8760 hours. HbA1C: Recent Labs    12/25/19 0257  HGBA1C 7.4*   CBG: Recent Labs  Lab 12/24/19 1433  GLUCAP 162*   Lipid Profile: Recent Labs    12/25/19 0257  CHOL 89  HDL 30*  LDLCALC 47  TRIG 60  CHOLHDL 3.0   Thyroid Function Tests: No results for input(s): TSH, T4TOTAL, FREET4, T3FREE, THYROIDAB in the last  72 hours. Anemia Panel: No results for input(s): VITAMINB12, FOLATE, FERRITIN, TIBC, IRON, RETICCTPCT in the last 72 hours. Sepsis Labs: No results for input(s): PROCALCITON, LATICACIDVEN in the last 168 hours.  Recent Results (from the past 240 hour(s))  SARS Coronavirus 2 by RT PCR (hospital order, performed in [facility] hospital lab) Nasopharyngeal Nasopharyngeal Swab     Status: None   Collection Time: 12/24/19  6:58 PM   Specimen: Nasopharyngeal Swab  Result Value Ref Range Status   SARS Coronavirus 2 NEGATIVE NEGATIVE Final    Comment: (NOTE) SARS-CoV-2 target nucleic acids are NOT DETECTED. The SARS-CoV-2 RNA is generally detectable in upper and lower respiratory specimens during the acute phase of infection. The lowest concentration of SARS-CoV-2 viral copies this assay can detect is 250 copies / mL. A negative result does not preclude SARS-CoV-2 infection and should not be used as the sole basis for treatment or other patient management decisions.  A negative result may occur with improper specimen collection / handling,  submission of specimen other than nasopharyngeal swab, presence of viral mutation(s) within the areas targeted by this assay, and inadequate number of viral copies (<250 copies / mL). A negative result must be combined with clinical observations, patient history, and epidemiological information. Fact Sheet for Patients:   StrictlyIdeas.no Fact Sheet for Healthcare Providers: BankingDealers.co.za This test is not yet approved or cleared  by the [place] FDA and has been authorized for detection and/or diagnosis of SARS-CoV-2 by FDA under an Emergency Use Authorization (EUA).  This EUA will remain in effect (meaning this test can be used) for the duration of the COVID-19 declaration under Section 564(b)(1) of the Act, 21 U.S.C. section 360bbb-3(b)(1), unless the authorization is terminated or revoked sooner. Performed at [facility], [place] [address], [place], [place] [address]          Radiology Studies: CT HEAD WO CONTRAST  Result Date: 12/24/2019 CLINICAL DATA:  Unsteady on feet, off balance since Wednesday, intermittent dizziness, fell on Thursday and Friday, possible stroke; history stroke, atrial fibrillation, hypertension, CHF, diabetes mellitus, former smoker EXAM: CT HEAD WITHOUT CONTRAST TECHNIQUE: Contiguous axial images were obtained from the base of the skull through the vertex without intravenous contrast. Sagittal and coronal MPR images reconstructed from axial data set. COMPARISON:  12/21/2019 FINDINGS: Brain: Generalized atrophy. Normal ventricular morphology. No midline shift or mass effect. Small vessel chronic ischemic changes of deep cerebral white matter. No intracranial hemorrhage, mass lesion, evidence of acute infarction, or extra-axial fluid collection. Vascular: Atherosclerotic calcification of internal carotid and vertebral arteries at skull base. No hyperdense vessels. Skull: Intact Sinuses/Orbits: Clear  Other: N/A IMPRESSION: Atrophy with small vessel chronic ischemic changes of deep cerebral white matter. No acute intracranial abnormalities. Electronically Signed   By: Lavonia Dana M.D.   On: 12/24/2019 14:15   MR ANGIO NECK W WO CONTRAST  Result Date: 12/24/2019 CLINICAL DATA:  Follow-up examination for acute stroke. EXAM: MRA NECK WITHOUT AND WITH CONTRAST TECHNIQUE: Multiplanar and multiecho pulse sequences of the neck were obtained without and with intravenous contrast. Angiographic images of the neck were obtained using MRA technique without and with intravenous contrast. CONTRAST:  7.53m GADAVIST GADOBUTROL 1 MMOL/ML IV SOLN COMPARISON:  Prior brain MRI from earlier the same day as well as previous MRI from 05/15/2019. FINDINGS: AORTIC ARCH: Examination moderately degraded by motion artifact. Visualized aortic arch of normal caliber with normal 3 vessel morphology. No flow-limiting stenosis seen about the origin of the great vessels.  RIGHT CAROTID SYSTEM: Right CCA patent from its origin to the bifurcation without flow-limiting stenosis. No significant atheromatous narrowing about the right bifurcation. Right ICA tortuous and widely patent distally to the circle-of-Willis without occlusion or other abnormality. Chronic severe stenosis at the origin of the right ECA partially visualize, similar to previous. LEFT CAROTID SYSTEM: Left CCA patent from its origin to the bifurcation without stenosis. Chronic left ICA occlusion at its origin, which remains occluded to the level of the siphon. Moderate to severe stenosis at the origin of the left ECA noted as well. VERTEBRAL ARTERIES: Both vertebral arteries arise from the subclavian arteries. No visible proximal subclavian stenosis. Moderate stenosis at the distal left subclavian artery noted (series 1037, image 9). Vertebral arteries widely patent within the neck, with the left vertebral slightly dominant. Chronic conclusion of the intracranial right V4 segment.  IMPRESSION: 1. No acute vascular abnormality within the neck. 2. Chronic left ICA occlusion. 3. Chronic distal right V4 segment occlusion. 4. Chronic severe stenosis at the origin of the right ECA, with moderate to severe stenosis at the origin of the left ECA. 5. Moderate distal left subclavian artery stenosis. Electronically Signed   By: Jeannine Boga M.D.   On: 12/24/2019 23:22   MR BRAIN WO CONTRAST  Result Date: 12/24/2019 CLINICAL DATA:  Unsteady gait, 3 fall; ataxia, stroke suspected. Additional provided: Intermittent dizziness, left knee pain, several recent falls. EXAM: MRI HEAD WITHOUT CONTRAST TECHNIQUE: Multiplanar, multiecho pulse sequences of the brain and surrounding structures were obtained without intravenous contrast. COMPARISON:  Prior head CT examinations 12/24/2019 and earlier, brain MRI 05/15/2019. CT angiogram neck 11/02/2014. FINDINGS: Brain: There are few tiny cortical/subcortical foci of restricted diffusion within the posterior right frontal lobe and right parietal lobe consistent with acute infarcts (for instance as seen on series 3, images 41, 38, 37). Redemonstrated advanced generalized parenchymal atrophy. As before, there are multiple chronic lacunar infarcts within the bilateral cerebral white matter. Otherwise mild chronic small vessel ischemic disease. No evidence of intracranial mass. No chronic intracranial blood products. No extra-axial fluid collection. No midline shift. Vascular: Signal abnormality within the V4 right vertebral artery consistent with known, chronic occlusion. Signal abnormality within the visualized distal cervical left ICA and left ICA siphon consistent with known chronic occlusion. As before there is reconstitution of the expected flow void within the paraclinoid left ICA. Skull and upper cervical spine: No focal marrow lesion. Sinuses/Orbits: Visualized orbits show no acute finding. Mild ethmoid sinus mucosal thickening. No significant mastoid  effusion. IMPRESSION: Several tiny acute cortical/subcortical infarcts within the posterior right frontal lobe and right parietal lobe. As before, there are multiple chronic lacunar infarcts within the bilateral cerebral white matter. Otherwise mild chronic small vessel ischemic disease. Stable, advanced generalized parenchymal atrophy. Signal abnormality consistent with known chronic occlusion of the V4 right vertebral artery, distal cervical left ICA and left ICA siphon. As before, there is reconstitution of the expected flow void within the paraclinoid left ICA. Mild ethmoid sinus mucosal thickening. Electronically Signed   By: Kellie Simmering DO   On: 12/24/2019 18:34   DG Knee Complete 4 Views Left  Result Date: 12/24/2019 CLINICAL DATA:  Fall.  Swelling. EXAM: LEFT KNEE - COMPLETE 4+ VIEW COMPARISON:  None. FINDINGS: There are moderate degenerative changes involving the LATERAL, MEDIAL, and patellofemoral compartments. Small joint effusion is present. No acute fracture. There is atherosclerotic calcification of the popliteal artery. IMPRESSION: Degenerative changes and small joint effusion. Electronically Signed   By: Nolon Nations  M.D.   On: 12/24/2019 17:07        Scheduled Meds: .  stroke: mapping our early stages of recovery book   Does not apply Once  . aspirin EC  81 mg Oral Daily  . atorvastatin  40 mg Oral QHS  . enoxaparin (LOVENOX) injection  40 mg Subcutaneous Q24H  . hydrochlorothiazide  25 mg Oral Daily  . lisinopril  40 mg Oral Daily  . metoprolol succinate  100 mg Oral Daily   Continuous Infusions:   LOS: 1 day    Time spent: 35 mins.More than 50% of that time was spent in counseling and/or coordination of care.      Shelly Coss, MD [facility] P6/01/2020, 8:19 AM

## 2019-12-25 NOTE — Consult Note (Addendum)
Cardiology Consultation:   Patient ID: Evan Stanley; [id number]; [birth year]   Admit date: 12/24/2019 Date of Consult: 12/25/2019  Primary Care Provider: [name], DO Primary Cardiologist: Sinclair Grooms, MD 11/23/2018 Leonette Monarch, NP 11/24/2019 Primary Electrophysiologist:  None   Patient Profile:   Evan Stanley is a 79 y.o. male with a hx of bioprosthetic aortic valve2010,DM II, HTN, HLD, left ventricular systolic dysfunction, right cerebral CVA (2017), 100% left ICA,RCEA, PAF (2017), PAD w/ occluded R distal SFA & L anterior tibial (per Dr [name], consider low-dose Xarelto),and DM type II, who is being seen today for the evaluation of Afib, anticoagulation at the request of Dr Tawanna Solo.  History of Present Illness:   Evan Stanley was doing well when seen in the office 05/07. He was seeing a nephrologist re: CKD. No CP w/ exertion. PCP follows labs.   Evan Stanley was admitted 06/06 after 2 falls and with ongoing problems w/ ambulation. Dx several new acute cortical/subcortical infarct of the posterior right frontal lobe and right parietal lobe in the setting of hx of CVA and PAD with carotid occlusions.  Evan. Costilla has not had palpitations.  He has no awareness that he has been in atrial fibrillation.  He remembers anticoagulation being discussed at the time of his valve replacement, but that was in discussion of what type of valve he would get.  He has never had a discussion with an MD regarding anticoagulation because of the atrial fibrillation.  He is reluctant to go on Coumadin because of bleeding risk, but is agreeable to considering anticoagulation, especially if it can be with one of the other drugs.  He is concerned about the cost of the other drugs because some of his diabetes medications are very expensive.  He has not had chest pain or shortness of breath.  Until his acute event, he was getting along very well.  He has been dealing with chronic left  knee pain, that is the main thing that limits his ambulation.  He is looking forward to getting stronger and playing golf again.   Past Medical History:  Diagnosis Date  . AKI (acute kidney injury) ([place])   . Allergy   . Anxiety   . Arthritis    Right shoulder  . Atrial fibrillation ([place])   . Carotid artery stenosis   . Cataract   . CHF (congestive heart failure) ([place])   . CVA (cerebral vascular accident) ([place]) 04/28/2016  . Decreased vision    left eye  . Diabetes mellitus without complication ([place])    Takes Metformin  . Hyperlipidemia   . Hypertension   . Salmonella bacteremia 04/29/2016  . Sepsis ([place]) 04/29/2016  . Stroke ([place]) 11/18/2015   no deficits  . TIA (transient ischemic attack)    affected left eye  . Urgency of urination     Past Surgical History:  Procedure Laterality Date  . AORTIC VALVE REPLACEMENT    . BACK SURGERY  80s  . CARDIAC VALVE REPLACEMENT  11/01/2008   21-mm Edwards Pericardial Magna - Ease valve  . ENDARTERECTOMY Right 01/13/2016   Procedure: ENDARTERECTOMY CAROTID - RIGHT;  Surgeon: Conrad [place], MD;  Location: [facility];  Service: Vascular;  Laterality: Right;  . EYE SURGERY    . laser surgery, left eye  Left 10-29-2015    retinal surgery/ Deloria Lair MD   . PERIPHERAL VASCULAR CATHETERIZATION Right 11/18/2015   Procedure: Carotid Angiography;  Surgeon: Conrad Grafton, MD;  Location:  [facility] INVASIVE CV LAB;  Service: Cardiovascular;  Laterality: Right;  . PERIPHERAL VASCULAR CATHETERIZATION N/A 11/18/2015   Procedure: Aortic Arch Angiography;  Surgeon: Conrad [place], MD;  Location: [facility] CV LAB;  Service: Cardiovascular;  Laterality: N/A;  . TEE WITHOUT CARDIOVERSION N/A 05/05/2016   Procedure: TRANSESOPHAGEAL ECHOCARDIOGRAM (TEE);  Surgeon: Lelon Perla, MD;  Location[facility] ENDOSCOPY;  Service: Cardiovascular;  Laterality: N/A;     Prior to Admission medications   Medication Sig Start Date End Date Taking? Authorizing Provider  aspirin EC 81 MG  tablet Take 1 tablet (81 mg total) by mouth daily. 08/16/15  Yes Belva Crome, MD  atorvastatin (LIPITOR) 40 MG tablet TAKE 1 TABLET BY MOUTH  DAILY Patient taking differently: Take 40 mg by mouth at bedtime.  11/07/19  Yes Gottschalk, Leatrice Jewels M, DO  atropine 1 % ophthalmic solution Place 1 drop into the left eye 3 (three) times daily as needed (eye pain).   Yes [provider]  cetirizine (ZYRTEC) 10 MG tablet Take 10 mg by mouth daily.   Yes [provider]  Dulaglutide (TRULICITY) 1.5 OF/7.5ZW SOPN Inject 1.5 mg into the skin once a week. Patient taking differently: Inject 1.5 mg into the skin every Sunday.  11/06/19  Yes [name], MD  hydrochlorothiazide (HYDRODIURIL) 25 MG tablet Take 1 tablet (25 mg total) by mouth daily. 10/02/19  Yes Gottschalk, Leatrice Jewels M, DO  ibuprofen (ADVIL) 200 MG tablet Take 400-600 mg by mouth every 6 (six) hours as needed (pain).   Yes [provider]  insulin degludec (TRESIBA FLEXTOUCH) 200 UNIT/ML FlexTouch Pen Inject 50 Units into the skin daily.   Yes [provider]  insulin lispro (HUMALOG) 100 UNIT/ML injection Inject 6 Units into the skin daily before supper.    Yes [provider]  latanoprost (XALATAN) 0.005 % ophthalmic solution Place 1 drop into the left eye at bedtime.    Yes [provider]  lisinopril (ZESTRIL) 40 MG tablet Take 1 tablet (40 mg total) by mouth daily. 10/02/19  Yes [name], DO  metoprolol succinate (TOPROL-XL) 100 MG 24 hr tablet Take with or immediately following a meal. Patient taking differently: Take 100 mg by mouth daily. Take with or immediately following a meal. 10/02/19  Yes Gottschalk, Ashly M, DO  vitamin C (ASCORBIC ACID) 500 MG tablet Take 500 mg by mouth daily.   Yes [provider]  vitamin E (VITAMIN E) 1000 UNIT capsule Take 1,000 Units by mouth daily.   Yes [provider]  amLODipine (NORVASC) 10 MG tablet TAKE 1 TABLET BY MOUTH   DAILY Patient not taking: Reported on 12/24/2019 11/07/19   [name], DO  Lancets [facility] ULTRASOFT) lancets Test BS BID Dx E11.9 09/11/19   [name], DO  ONETOUCH ULTRA test strip USE TO CHECK BLOOD SUGAR  TWICE DAILY 06/05/19   [name], DO    Inpatient Medications: Scheduled Meds: .  stroke: mapping our early stages of recovery book   Does not apply Once  . aspirin EC  81 mg Oral Daily  . atorvastatin  40 mg Oral QHS  . enoxaparin (LOVENOX) injection  40 mg Subcutaneous Q24H  . hydrochlorothiazide  25 mg Oral Daily  . insulin aspart  0-15 Units Subcutaneous TID WC  . insulin aspart  0-5 Units Subcutaneous QHS  . lisinopril  40 mg Oral Daily  . metoprolol succinate  100 mg Oral Daily   Continuous Infusions:  PRN  Meds: acetaminophen **OR** acetaminophen (TYLENOL) oral liquid 160 mg/5 mL **OR** acetaminophen, perflutren lipid microspheres (DEFINITY) IV suspension, senna-docusate  Allergies:   No Known Allergies  Social History:   Social History   Socioeconomic History  . Marital status: Married    Spouse name: Not on file  . Number of children: 2  . Years of education: 57  . Highest education level: 11th grade  Occupational History  . Occupation: maintenance    Employer: [employer]    Comment: Retired  Tobacco Use  . Smoking status: Former Smoker    Packs/day: 2.00    Years: 20.00    Pack years: 40.00    Types: Cigarettes    Quit date: 07/20/1986    Years since quitting: 33.4  . Smokeless tobacco: Never Used  Substance and Sexual Activity  . Alcohol use: No    Alcohol/week: 0.0 standard drinks  . Drug use: No  . Sexual activity: Yes  Other Topics Concern  . Not on file  Social History Narrative  . Not on file   Social Determinants of Health   Financial Resource Strain:   . Difficulty of Paying Living Expenses:   Food Insecurity:   . Worried About Charity fundraiser in the Last Year:   . Arboriculturist in the Last Year:    Transportation Needs:   . Film/video editor (Medical):   Marland Kitchen Lack of Transportation (Non-Medical):   Physical Activity:   . Days of Exercise per Week:   . Minutes of Exercise per Session:   Stress:   . Feeling of Stress :   Social Connections:   . Frequency of Communication with Friends and Family:   . Frequency of Social Gatherings with Friends and Family:   . Attends Religious Services:   . Active Member of Clubs or Organizations:   . Attends Archivist Meetings:   Marland Kitchen Marital Status:   Intimate Partner Violence:   . Fear of Current or Ex-Partner:   . Emotionally Abused:   Marland Kitchen Physically Abused:   . Sexually Abused:     Family History:   Family History  Problem Relation Age of Onset  . Heart disease Mother   . Cancer Mother        breast  . Stroke Father 81  . Hypertension Father   . Early death Sister        infant death  . Heart disease Brother   . Hydrocephalus Brother   . Heart disease Brother   . Diabetes Brother   . Cancer Brother        prostat  . Healthy Son   . Healthy Son    Family Status:  Family Status  Relation Name Status  . Mother  Deceased at age 23  . Father  Deceased at age 4  . Sister  Deceased  . Brother Ulice Dash Deceased at age 71's       drowned  . Brother Camila Li Deceased  . Brother [employer]  . Brother [employer]  . Son  Alive  . Son  Alive    ROS:  Please see the history of present illness.  All other ROS reviewed and negative.     Physical Exam/Data:   Vitals:   12/24/19 2200 12/24/19 2218 12/25/19 0532 12/25/19 0754  BP: 124/80 (!) 143/71 122/60 108/68  Pulse: 68 85 64 65  Resp: 18 18 18 18   Temp: 98.5 F (36.9 C) 98.4 F (36.9 C) 98.3 F (  36.8 C) 97.6 F (36.4 C)  TempSrc:  Oral Oral Oral  SpO2: 100% 100%  98%  Weight:  75.1 kg    Height:  5' 5"  (1.651 m)      Intake/Output Summary (Last 24 hours) at 12/25/2019 1711 Last data filed at 12/25/2019 0956 Gross per 24 hour  Intake 240 ml  Output 200 ml   Net 40 ml    Last 3 Weights 12/24/2019 12/24/2019 12/21/2019  Weight (lbs) 165 lb 9.1 oz 163 lb 163 lb  Weight (kg) 75.1 kg 73.936 kg 73.936 kg     Body mass index is 27.55 kg/m.   General:  Well nourished, well developed, male in no acute distress HEENT: normal Lymph: no adenopathy Neck: JVD -not elevated Endocrine:  No thryomegaly Vascular: L>R carotid bruits; 4/4 extremity pulses 2+  Cardiac:  normal S1, S2; RRR; 2/6 murmur Lungs:  clear bilaterally, no wheezing, rhonchi or rales  Abd: soft, nontender, no hepatomegaly  Ext: no edema Musculoskeletal:  No deformities, BUE strength normal and equal; LLE weakness Skin: warm and dry  Neuro: Left facial droop Psych:  Normal affect   EKG:  The EKG was personally reviewed and demonstrates:  6/6 ECG is sinus rhythm, heart rate 70s, no acute ischemic changes Telemetry:  Telemetry was personally reviewed and demonstrates: Sinus rhythm   CV studies:   ECHO 04/2019: 1. Left ventricular ejection fraction, by visual estimation, is 55 to  60%. The left ventricle has normal function. Normal left ventricular size.  There is no left ventricular hypertrophy.  2. Global right ventricle has normal systolic function.The right  ventricular size is normal. No increase in right ventricular wall  thickness.  3. Left atrial size was mildly dilated.  4. Right atrial size was normal.  5. Moderate mitral annular calcification.  6. The mitral valve is abnormal. Trace mitral valve regurgitation.  7. The tricuspid valve is grossly normal. Tricuspid valve regurgitation  is trivial.  8. The aortic valve was not well visualized Aortic valve regurgitation  was not visualized by color flow Doppler.  9. The pulmonic valve was not well visualized. Pulmonic valve  regurgitation is trivial by color flow Doppler.   CATH: 10/29/2008 RESULTS:  1. Hemodynamic data:  1A.  Right atrial mean pressure 1 mmHg.  1B.  Left ventricular pressure 150/40 mmHg.   1C.  Pulmonary artery pressure 50/12 mmHg.  1D.  Pulmonary capillary wedge mean 24 mmHg.  1E.  Aortic pressure 164/80.  72F.  Left ventricular pressure 212/23 mmHg.  1G.  Aortic valve gradient peak to peak 48 mmHg.  1H.  Cardiac output by Fick 5.6 L/min and thermodilution 5.16 L/min.  1I.  Aortic valve area using the Fick cardiac output 0.9 cm2.  1. Left ventriculography:  Left ventricular cavity size is normal.  LV      function is normal.  EF is 55%.  No mitral regurgitation is noted.  2. Coronary angiography:  3A.  Left main:  Left main was short, but widely patent.  3B.  Left anterior descending coronary artery:  The large vessel that  wraps around the left ventricular apex.  The midvessel contains moderate  obstruction with less than 50% narrowing.  3C.  Circumflex coronary artery:  The circumflex coronary artery is a  large vessel that gives origin to 3 obtuse marginal branches with 50%  midvessel stenosis and 50-70% stenosis in the third obtuse margin.  3D.  Right coronary artery:  There is eccentric 40-50% narrowing in the  midvessel.  The distal vessel gives origin to PDA of the left ventricle  branch.   CONCLUSION:  1. Severe aortic stenosis with an aortic valve area between 0.88 and      1.0 cm2 depending upon the cardiac output used.  It was felt that      the patient has a calcific and possibly bicuspid aortic valve.  2. Moderately severe pulmonary hypertension with pulmonary artery      pressure of 50/12 mmHg.  3. Low normal left ventricular function with an ejection fraction of      50-55%.  4. Mild-to-moderate mid- left anterior descending and mid-to-distal      circumflex.  5. Calcified ascending aorta.   RECOMMENDATIONS:  Consideration for aortic valve replacement and a  possible coronary artery bypass grafting by Dr. Cyndia Bent.  MYOVIEW: 01/09/2016  Nuclear stress EF: 63%.  Findings consistent with prior myocardial infarction with peri-infarct  ischemia.  This is a low risk study.  The left ventricular ejection fraction is normal (55-65%).   Small inferolateral wall infarct at mid and basal level with mild peri infarct ischemia.  EF 63%    Laboratory Data:   Chemistry Recent Labs  Lab 12/21/19 1944 12/24/19 1350 12/24/19 1424  NA 140 134* 137  K 3.9 3.9 3.9  CL 103 101 98  CO2 27 25  --   GLUCOSE 115* 179* 177*  BUN 39* 33* 34*  CREATININE 2.10* 1.85* 1.90*  CALCIUM 8.9 9.2  --   GFRNONAA 30* 35*  --   GFRAA 34* 40*  --   ANIONGAP 10 8  --     Lab Results  Component Value Date   ALT 18 12/24/2019   AST 18 12/24/2019   ALKPHOS 80 12/24/2019   BILITOT 1.0 12/24/2019   Hematology Recent Labs  Lab 12/21/19 1944 12/24/19 1350 12/24/19 1424  WBC 9.4 9.5  --   RBC 5.51 5.86*  --   HGB 14.8 15.5 16.3  HCT 47.0 48.5 48.0  MCV 85.3 82.8  --   MCH 26.9 26.5  --   MCHC 31.5 32.0  --   RDW 13.4 13.4  --   PLT 176 207  --    Cardiac Enzymes High Sensitivity Troponin:  No results for input(s): TROPONINIHS in the last 720 hours.    BNPNo results for input(s): BNP, PROBNP in the last 168 hours.  DDimer No results for input(s): DDIMER in the last 168 hours. TSH:  Lab Results  Component Value Date   TSH 1.93 09/06/2019   Lipids: Lab Results  Component Value Date   CHOL 89 12/25/2019   HDL 30 (L) 12/25/2019   LDLCALC 47 12/25/2019   LDLDIRECT 62 02/16/2017   TRIG 60 12/25/2019   CHOLHDL 3.0 12/25/2019   HgbA1c: Lab Results  Component Value Date   HGBA1C 7.4 (H) 12/25/2019   Magnesium:  Magnesium  Date Value Ref Range Status  05/02/2016 1.9 1.7 - 2.4 mg/dL Final     Radiology/Studies:  CT HEAD WO CONTRAST  Result Date: 12/24/2019 CLINICAL DATA:  Unsteady on feet, off balance since Wednesday, intermittent dizziness, fell on Thursday and Friday, possible stroke; history stroke, atrial fibrillation, hypertension, CHF, diabetes mellitus, former smoker EXAM: CT HEAD WITHOUT CONTRAST TECHNIQUE:  Contiguous axial images were obtained from the base of the skull through the vertex without intravenous contrast. Sagittal and coronal MPR images reconstructed from axial data set. COMPARISON:  12/21/2019 FINDINGS: Brain: Generalized atrophy. Normal ventricular morphology. No midline shift or  mass effect. Small vessel chronic ischemic changes of deep cerebral white matter. No intracranial hemorrhage, mass lesion, evidence of acute infarction, or extra-axial fluid collection. Vascular: Atherosclerotic calcification of internal carotid and vertebral arteries at skull base. No hyperdense vessels. Skull: Intact Sinuses/Orbits: Clear Other: N/A IMPRESSION: Atrophy with small vessel chronic ischemic changes of deep cerebral white matter. No acute intracranial abnormalities. Electronically Signed   By: Lavonia Dana M.D.   On: 12/24/2019 14:15   CT Head Wo Contrast  Result Date: 12/21/2019 CLINICAL DATA:  79 year old male with head trauma. Dizziness. EXAM: CT HEAD WITHOUT CONTRAST TECHNIQUE: Contiguous axial images were obtained from the base of the skull through the vertex without intravenous contrast. COMPARISON:  Head CT dated 05/13/2019. FINDINGS: Brain: Moderate age-related atrophy and chronic microvascular ischemic changes. There is no acute intracranial hemorrhage. No mass effect or midline shift. No extra-axial fluid collection. Vascular: No hyperdense vessel or unexpected calcification. Skull: Normal. Negative for fracture or focal lesion. Sinuses/Orbits: No acute finding. Other: None IMPRESSION: 1. No acute intracranial pathology. 2. Moderate age-related atrophy and chronic microvascular ischemic changes. Electronically Signed   By: Anner Crete M.D.   On: 12/21/2019 19:43   Evan ANGIO HEAD WO CONTRAST  Result Date: 12/25/2019 CLINICAL DATA:  Stroke follow-up EXAM: MRA HEAD WITHOUT CONTRAST TECHNIQUE: Angiographic images of the Circle of Willis were obtained using MRA technique without intravenous contrast.  COMPARISON:  MRA of the neck 05/15/2019 FINDINGS: Chronically occluded left ICA in the neck with faint flow in the supraclinoid ICA and MCA branches. The left A1 segment is hypoplastic but patent. No downstream branch occlusion is seen. Robust flow in the right carotid siphon and downstream branches. Chronic right V4 segment occlusion. Robust flow in the left vertebral artery. The dominant bilateral cerebellar arteries are patent and enhancing. There is an apparent filling defect in the central mid to distal basilar artery which is considered artifactual given preceding neck MRA with contrast appearance which shows complete filling of this vessel. Moderate proximal basilar and right P2 segment narrowing. IMPRESSION: 1. No emergent finding. 2. Chronic cervical left ICA and right V4 segment occlusion. 3. Moderate proximal basilar and right P2 segment stenosis. Electronically Signed   By: Monte Fantasia M.D.   On: 12/25/2019 08:40   Evan ANGIO NECK W WO CONTRAST  Result Date: 12/24/2019 CLINICAL DATA:  Follow-up examination for acute stroke. EXAM: MRA NECK WITHOUT AND WITH CONTRAST TECHNIQUE: Multiplanar and multiecho pulse sequences of the neck were obtained without and with intravenous contrast. Angiographic images of the neck were obtained using MRA technique without and with intravenous contrast. CONTRAST:  7.16m GADAVIST GADOBUTROL 1 MMOL/ML IV SOLN COMPARISON:  Prior brain MRI from earlier the same day as well as previous MRI from 05/15/2019. FINDINGS: AORTIC ARCH: Examination moderately degraded by motion artifact. Visualized aortic arch of normal caliber with normal 3 vessel morphology. No flow-limiting stenosis seen about the origin of the great vessels. RIGHT CAROTID SYSTEM: Right CCA patent from its origin to the bifurcation without flow-limiting stenosis. No significant atheromatous narrowing about the right bifurcation. Right ICA tortuous and widely patent distally to the circle-of-Willis without  occlusion or other abnormality. Chronic severe stenosis at the origin of the right ECA partially visualize, similar to previous. LEFT CAROTID SYSTEM: Left CCA patent from its origin to the bifurcation without stenosis. Chronic left ICA occlusion at its origin, which remains occluded to the level of the siphon. Moderate to severe stenosis at the origin of the left ECA noted as well.  VERTEBRAL ARTERIES: Both vertebral arteries arise from the subclavian arteries. No visible proximal subclavian stenosis. Moderate stenosis at the distal left subclavian artery noted (series 1037, image 9). Vertebral arteries widely patent within the neck, with the left vertebral slightly dominant. Chronic conclusion of the intracranial right V4 segment. IMPRESSION: 1. No acute vascular abnormality within the neck. 2. Chronic left ICA occlusion. 3. Chronic distal right V4 segment occlusion. 4. Chronic severe stenosis at the origin of the right ECA, with moderate to severe stenosis at the origin of the left ECA. 5. Moderate distal left subclavian artery stenosis. Electronically Signed   By: Jeannine Boga M.D.   On: 12/24/2019 23:22   Evan BRAIN WO CONTRAST  Result Date: 12/24/2019 CLINICAL DATA:  Unsteady gait, 3 fall; ataxia, stroke suspected. Additional provided: Intermittent dizziness, left knee pain, several recent falls. EXAM: MRI HEAD WITHOUT CONTRAST TECHNIQUE: Multiplanar, multiecho pulse sequences of the brain and surrounding structures were obtained without intravenous contrast. COMPARISON:  Prior head CT examinations 12/24/2019 and earlier, brain MRI 05/15/2019. CT angiogram neck 11/02/2014. FINDINGS: Brain: There are few tiny cortical/subcortical foci of restricted diffusion within the posterior right frontal lobe and right parietal lobe consistent with acute infarcts (for instance as seen on series 3, images 41, 38, 37). Redemonstrated advanced generalized parenchymal atrophy. As before, there are multiple chronic  lacunar infarcts within the bilateral cerebral white matter. Otherwise mild chronic small vessel ischemic disease. No evidence of intracranial mass. No chronic intracranial blood products. No extra-axial fluid collection. No midline shift. Vascular: Signal abnormality within the V4 right vertebral artery consistent with known, chronic occlusion. Signal abnormality within the visualized distal cervical left ICA and left ICA siphon consistent with known chronic occlusion. As before there is reconstitution of the expected flow void within the paraclinoid left ICA. Skull and upper cervical spine: No focal marrow lesion. Sinuses/Orbits: Visualized orbits show no acute finding. Mild ethmoid sinus mucosal thickening. No significant mastoid effusion. IMPRESSION: Several tiny acute cortical/subcortical infarcts within the posterior right frontal lobe and right parietal lobe. As before, there are multiple chronic lacunar infarcts within the bilateral cerebral white matter. Otherwise mild chronic small vessel ischemic disease. Stable, advanced generalized parenchymal atrophy. Signal abnormality consistent with known chronic occlusion of the V4 right vertebral artery, distal cervical left ICA and left ICA siphon. As before, there is reconstitution of the expected flow void within the paraclinoid left ICA. Mild ethmoid sinus mucosal thickening. Electronically Signed   By: Kellie Simmering DO   On: 12/24/2019 18:34   DG Knee Complete 4 Views Left  Result Date: 12/24/2019 CLINICAL DATA:  Fall.  Swelling. EXAM: LEFT KNEE - COMPLETE 4+ VIEW COMPARISON:  None. FINDINGS: There are moderate degenerative changes involving the LATERAL, MEDIAL, and patellofemoral compartments. Small joint effusion is present. No acute fracture. There is atherosclerotic calcification of the popliteal artery. IMPRESSION: Degenerative changes and small joint effusion. Electronically Signed   By: Nolon Nations M.D.   On: 12/24/2019 17:07   ECHOCARDIOGRAM  COMPLETE  Result Date: 12/25/2019    ECHOCARDIOGRAM REPORT   Patient Name:   Evan Stanley Date of Exam: 12/25/2019 Medical Rec #:  [id number]        Height:       65.0 in Accession #:    [id number]       Weight:       165.6 lb Date of Birth:  [birth year]         BSA:  1.825 m Patient Age:    36 years         BP:           108/68 mmHg Patient Gender: M                HR:           65 bpm. Exam Location:  Inpatient Procedure: 2D Echo, Intracardiac Opacification Agent, Cardiac Doppler and Color            Doppler Indications:    Stroke  History:        Patient has prior history of Echocardiogram examinations, most                 recent 05/15/2019. Risk Factors:Diabetes and Dyslipidemia. CKD.                 S/p aortic valve replacement.  Sonographer:    [name] Lefort RDCS (AE) Referring Phys: [id number] [place] T TU  Sonographer Comments: Technically difficult study due to poor echo windows, suboptimal parasternal window, suboptimal apical window and no subcostal window. IMPRESSIONS  1. Left ventricular ejection fraction, by estimation, is 65 to 70%. The left ventricle has normal function. The left ventricle has no regional wall motion abnormalities. There is moderate left ventricular hypertrophy. Left ventricular diastolic parameters are consistent with Grade I diastolic dysfunction (impaired relaxation). Elevated left ventricular end-diastolic pressure.  2. Right ventricular systolic function is low normal. The right ventricular size is normal.  3. The mitral valve is abnormal. Trivial mitral valve regurgitation.  4. The aortic valve has been repaired/replaced with a bioprosthetic valve. Aortic valve regurgitation is not visualized. Aortic valve area, by VTI measures 2.69 cm. Aortic valve mean gradient measures 8.0 mmHg. Peak gradient is 13.5 mmHg. Aortic valve Vmax measures 1.84 m/s. Comparison(s): 05/15/2019: LVEF 60-65%, aortic valve mean gradient 13 mmHg, peak gradient 24 mmHg. FINDINGS  Left Ventricle: Left  ventricular ejection fraction, by estimation, is 65 to 70%. The left ventricle has normal function. The left ventricle has no regional wall motion abnormalities. Definity contrast agent was given IV to delineate the left ventricular  endocardial borders. The left ventricular internal cavity size was normal in size. There is moderate left ventricular hypertrophy. Left ventricular diastolic parameters are consistent with Grade I diastolic dysfunction (impaired relaxation). Elevated left ventricular end-diastolic pressure. Right Ventricle: The right ventricular size is normal. No increase in right ventricular wall thickness. Right ventricular systolic function is low normal. Left Atrium: Left atrial size was normal in size. Right Atrium: Right atrial size was normal in size. Pericardium: There is no evidence of pericardial effusion. Mitral Valve: The mitral valve is abnormal. There is mild thickening of the mitral valve leaflet(s). Mild to moderate mitral annular calcification. Trivial mitral valve regurgitation. MV peak gradient, 4.2 mmHg. The mean mitral valve gradient is 2.0 mmHg. Tricuspid Valve: The tricuspid valve is not well visualized. Tricuspid valve regurgitation is not demonstrated. Aortic Valve: The aortic valve has been repaired/replaced. Aortic valve regurgitation is not visualized. Aortic valve mean gradient measures 8.0 mmHg. There is a unknown bioprosthetic valve present in the aortic position. Pulmonic Valve: The pulmonic valve was not well visualized. Pulmonic valve regurgitation is not visualized. Aorta: The aortic root and ascending aorta are structurally normal, with no evidence of dilitation. Venous: The inferior vena cava was not well visualized. IAS/Shunts: No atrial level shunt detected by color flow Doppler.  LEFT VENTRICLE PLAX 2D LVIDd:         4.00  cm  Diastology LVIDs:         2.39 cm  LV e' lateral:   6.75 cm/s LV PW:         1.40 cm  LV E/e' lateral: 15.7 LV IVS:        1.25 cm  LV e'  medial:    5.11 cm/s LVOT diam:     2.40 cm  LV E/e' medial:  20.7 LV SV:         84 LV SV Index:   46 LVOT Area:     4.52 cm  RIGHT VENTRICLE RV Basal diam:  2.98 cm RV S prime:     8.87 cm/s LEFT ATRIUM             Index       RIGHT ATRIUM           Index LA diam:        2.60 cm 1.42 cm/m  RA Area:     14.50 cm LA Vol (A2C):   50.2 ml 27.50 ml/m RA Volume:   33.40 ml  18.30 ml/m LA Vol (A4C):   55.8 ml 30.57 ml/m LA Biplane Vol: 55.2 ml 30.24 ml/m  AORTIC VALVE AV Area (Vmax):    2.32 cm AV Area (Vmean):   2.37 cm AV Area (VTI):     2.69 cm AV Vmax:           184.00 cm/s AV Vmean:          121.000 cm/s AV VTI:            0.313 m AV Peak Grad:      13.5 mmHg AV Mean Grad:      8.0 mmHg LVOT Vmax:         94.20 cm/s LVOT Vmean:        63.500 cm/s LVOT VTI:          0.186 m LVOT/AV VTI ratio: 0.59  AORTA Ao Root diam: 2.30 cm MITRAL VALVE MV Area (PHT): 3.12 cm     SHUNTS MV Peak grad:  4.2 mmHg     Systemic VTI:  0.19 m MV Mean grad:  2.0 mmHg     Systemic Diam: 2.40 cm MV Vmax:       1.03 m/s MV Vmean:      55.9 cm/s MV Decel Time: 243 msec MV E velocity: 106.00 cm/s MV A velocity: 96.80 cm/s MV E/A ratio:  1.10 Lyman Bishop MD Electronically signed by Lyman Bishop MD Signature Date/Time: 12/25/2019/4:45:22 PM    Final     Assessment and Plan:   1.  Anticoagulation: -CHA2DS2-VASc is 45  (age x 2, CVA x 2, CAD, DM, HTN) -Because of his history of atrial fibrillation and recurrent acute CVA, anticoagulation is indicated. -Because anticoagulation is indicated regardless, MD advise if TEE is indicated -Telemetry reviewed.  No atrial fibrillation this admission on available strips.  However, on 04/2019 and 04/2016 telemetry, brief episodes of PAF were seen. -Continue to follow on telemetry -His creatinine is greater than 1.5, but he is less than 85 and weighs more than 60 kg so would get Eliquis 5 mg twice daily  Otherwise, per IM/Neuro Principal Problem:   Stroke [facility]) Active Problems:   Type  2 diabetes mellitus ([place])   Hyperlipidemia associated w/ type 2 DM goal <70   Occlusion and stenosis of right carotid artery   Left Carotid artery occlusion   History of aortic valve replacement   Stage  3 chronic kidney disease     For questions or updates, please contact [facility] Please consult [email or web address] for contact info under Cardiology/STEMI.   SignedRosaria Ferries, PA-C  12/25/2019 5:11 PM

## 2019-12-25 NOTE — Evaluation (Signed)
Clinical/Bedside Swallow Evaluation Patient Details  Name: Evan Stanley MRN: [id number] Date of Birth: 04-Dec-1940  Today's Date: 12/25/2019 Time: SLP Start Time (ACUTE ONLY): 0818 SLP Stop Time (ACUTE ONLY): 0830 SLP Time Calculation (min) (ACUTE ONLY): 12 min  Past Medical History:  Past Medical History:  Diagnosis Date  . AKI (acute kidney injury) ([place])   . Allergy   . Anxiety   . Arthritis    Right shoulder  . Atrial fibrillation ([place])   . Benign essential HTN   . Carotid artery stenosis   . Cataract   . CHF (congestive heart failure) ([place])   . CVA (cerebral vascular accident) ([place]) 04/28/2016  . Decreased vision    left eye  . Diabetes mellitus without complication ([place])    Takes Metformin  . Hyperlipidemia   . Hypertension   . Salmonella bacteremia 04/29/2016  . Sepsis ([place]) 04/29/2016  . Stroke ([place]) 11/18/2015   no deficits  . TIA (transient ischemic attack)    affected left eye  . Urgency of urination    Past Surgical History:  Past Surgical History:  Procedure Laterality Date  . AORTIC VALVE REPLACEMENT    . BACK SURGERY  80s  . CARDIAC VALVE REPLACEMENT  2010  . ENDARTERECTOMY Right 01/13/2016   Procedure: ENDARTERECTOMY CAROTID - RIGHT;  Surgeon: Conrad Gibson, MD;  Location: [facility];  Service: Vascular;  Laterality: Right;  . EYE SURGERY    . laser surgery, left eye  Left 10-29-2015    retinal surgery/ Deloria Lair MD   . PERIPHERAL VASCULAR CATHETERIZATION Right 11/18/2015   Procedure: Carotid Angiography;  Surgeon: Conrad [place], MD;  Location: [facility];  Service: Cardiovascular;  Laterality: Right;  . PERIPHERAL VASCULAR CATHETERIZATION N/A 11/18/2015   Procedure: Aortic Arch Angiography;  Surgeon: Conrad Lodi, MD;  Location: [facility];  Service: Cardiovascular;  Laterality: N/A;  . TEE WITHOUT CARDIOVERSION N/A 05/05/2016   Procedure: TRANSESOPHAGEAL ECHOCARDIOGRAM (TEE);  Surgeon: Lelon Perla, MD;  Location: [facility] ENDOSCOPY;  Service:  Cardiovascular;  Laterality: N/A;   HPI:  Evan Stanley is a 79 y.o. 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 MRI showed several tiny acute cortical/subcortical infarcts within the posterior right frontal lobe and right parietal lobe.   Assessment / Plan / Recommendation Clinical Impression  Pt denies dysphagia from prior stroke 2017. Oral-motor abilities are functional, strong cough and at baseline he does not wear lower dentures but top plate is donned. Swallow function is normal consuming multiple cup and straw sips with coordinated respiration/swallowing, no s/s aspiration. Eructation present- denies esophageal difficulty. Recommend regular texture, thin liquids, straws allowed, pills with thin and no further ST warranted at present.   SLP Visit Diagnosis: Dysphagia, unspecified (R13.10)    Aspiration Risk  Mild aspiration risk    Diet Recommendation Regular;Thin liquid   Liquid Administration via: Cup;Straw Medication Administration: Whole meds with liquid Supervision: Patient able to self feed Compensations: Slow rate;Small sips/bites Postural Changes: Seated upright at 90 degrees    Other  Recommendations Oral Care Recommendations: Oral care BID   Follow up Recommendations None      Frequency and Duration            Prognosis  Swallow Study   General HPI: Evan Stanley is a 79 y.o. 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 MRI  showed several tiny acute cortical/subcortical infarcts within the posterior right frontal lobe and right parietal lobe. Type of Study: Bedside Swallow Evaluation Previous Swallow Assessment: none Diet Prior to this Study: NPO Temperature Spikes Noted: No Respiratory Status: Room air History of Recent Intubation: No Behavior/Cognition: Alert;Cooperative;Pleasant mood Oral Cavity Assessment: Within Functional Limits Oral Care Completed by SLP: Yes Oral Cavity - Dentition: Dentures, top(does not wear lower) Vision: Functional for self-feeding Self-Feeding Abilities: Able to feed self Patient Positioning: Upright in bed Baseline Vocal Quality: Normal Volitional Cough: Strong Volitional Swallow: Able to elicit    Oral/Motor/Sensory Function Overall Oral Motor/Sensory Function: Within functional limits   Ice Chips Ice chips: Not tested   Thin Liquid Thin Liquid: Within functional limits Presentation: Cup;Straw    Nectar Thick Nectar Thick Liquid: Not tested   Honey Thick Honey Thick Liquid: Not tested   Puree Puree: Within functional limits   Solid     Solid: Within functional limits      Evan Stanley 12/25/2019,9:34 AM  Evan Stanley.Ed Risk analyst [phone] Office [phone]

## 2019-12-26 ENCOUNTER — Ambulatory Visit: Payer: Medicare Other | Admitting: Nurse Practitioner

## 2019-12-26 DIAGNOSIS — I639 Cerebral infarction, unspecified: Secondary | ICD-10-CM

## 2019-12-26 DIAGNOSIS — R269 Unspecified abnormalities of gait and mobility: Secondary | ICD-10-CM

## 2019-12-26 LAB — BASIC METABOLIC PANEL
Anion gap: 11 (ref 5–15)
BUN: 43 mg/dL — ABNORMAL HIGH (ref 8–23)
CO2: 29 mmol/L (ref 22–32)
Calcium: 9.2 mg/dL (ref 8.9–10.3)
Chloride: 99 mmol/L (ref 98–111)
Creatinine, Ser: 2.05 mg/dL — ABNORMAL HIGH (ref 0.61–1.24)
GFR calc Af Amer: 35 mL/min — ABNORMAL LOW (ref >60)
GFR calc non Af Amer: 31 mL/min — ABNORMAL LOW (ref >60)
Glucose, Bld: 143 mg/dL — ABNORMAL HIGH (ref 70–99)
Potassium: 3.9 mmol/L (ref 3.5–5.1)
Sodium: 139 mmol/L (ref 135–145)

## 2019-12-26 LAB — GLUCOSE, CAPILLARY
Glucose-Capillary: 138 mg/dL — ABNORMAL HIGH (ref 70–99)
Glucose-Capillary: 141 mg/dL — ABNORMAL HIGH (ref 70–99)

## 2019-12-26 MED ORDER — APIXABAN 5 MG PO TABS
5.0000 mg | ORAL_TABLET | Freq: Two times a day (BID) | ORAL | Status: DC
  Administered 2019-12-26 – 2019-12-27 (×2): 5 mg via ORAL
  Filled 2019-12-26 (×3): qty 1

## 2019-12-26 NOTE — Progress Notes (Signed)
CBG was 127 before lunch didnt transfer from port.

## 2019-12-26 NOTE — Progress Notes (Signed)
PROGRESS NOTE    Evan Stanley  UMP:[id number] DOB: [birth year] DOA: 12/24/2019 PCP: [name], DO   Brief Narrative:  Patient is a 79 year old male with history of bioprosthetic aortic valve placed in 2010, hyperlipidemia, coronary artery disease, left ventricular systolic dysfunction, paroxysmal A. fib , right cerebellar CVA in 2017 with residual left eye blindness, total occlusion of right ICA, CKD stage IIIb, diabetes type 2 who presents with recurrent fall.  Last fall was about 1 days ago before admission.  On presentation, he was complaining of pain on left knee.  He was evaluated at [facility] hospital and had negative CT head and was discharged home.  Had a lot of difficulty with ambulation after discharge and decided to present to the emergency department again.  He uses walker for ambulation.  On presentation, he was hemodynamically stable.  MRI showed multiple acute cortical/subcortical infarcts.  Admitted for further stroke work-up.  Neurology  consulted and following.  PT/OT recommended CIR. Now waiting for bed at CIR.  Assessment & Plan:   Principal Problem:   Stroke [facility]) Active Problems:   Type 2 diabetes mellitus ([place])   Hyperlipidemia associated w/ type 2 DM goal <70   Occlusion and stenosis of right carotid artery   Left Carotid artery occlusion   History of aortic valve replacement   Stage 3 chronic kidney disease   Acute nonhemorrhagic stroke: MRI showed several tiny acute cortical/subcortical infarcts within the posterior right frontal lobe and right parietal lobe.multiple chronic lacunar infarcts within the bilateral cerebral white matter.chronic occlusion of the V4 right vertebral artery, distal cervical left ICA and left ICA siphon.  MRA neck showed Chronic left ICA occlusion,chronic distal right V4 segment occlusion,chronic severe stenosis at the origin of the right ECA, with moderate to severe stenosis at the origin of the left ECA,moderate distal left subclavian  artery stenosis. MRA head showed chronic cervical left ICA and right V4 segment occlusion,moderate proximal basilar and right P2 segment stenosis .   Echo showed ejection fraction of 65 to 70%, no regional wall motion abnormality, moderate left ventricular hypertrophy, grade 1 diastolic dysfunction, no LV clot, no atrial shunts. Hemoglobin A1c 7.4.  LDL of 47.  He was  on aspirin at home. He has history of previous stroke but currently fully functional before admission. PT/OT recommending CIR.  Paroxysmal A. QMG:QQPYP was some documentations about presence of A. fib in 2020, 2017.  Currently he is in normal sinus rhythm.  He was not on any anticoagulation.CHADSVASC=7.  We consulted cardiology for recommendation on starting on anticoagulation.  Cardiology recommended to start on Eliquis.    History of peripheral artery disease:Has significant peripheral artery disease with history of right carotid enterectomy and chronic left ICA and severe right ECA stenosis.  He currently only takes aspirin for his previous stroke.   Left knee pain: Most likely history with fall.  X-ray did not show any fracture dislocation but showed chronic degenerative disease.  CKD stage IIIb: Currently kidney function at baseline.  Baseline creatinine around 1.9.  Diabetes type 2: Hemoglobin A1c of 5.8 is in February this year.  Increased to 7.4 now.  Takes [name], Humalog at home.  Currently on sliding scale insulin.  Monitor CBGs.  Hypertension: Continue current medications.  History of aortic valve repair/coronary artery  disease/stroke: On aspirin at home .       DVT prophylaxis:Eliquis Code Status: Full Family Communication: Discussed with wife at bedside on 12/26/19 Status is: Inpatient  Remains inpatient appropriate because:Ongoing  diagnostic testing needed not appropriate for outpatient work up   Dispo: The patient is from: Home              Anticipated d/c is to: CIR              Anticipated d/c date  is: As soon as bed is available Patient is medically stable for discharge as soon as bed is available at CIR.    Consultants: Neurology  Procedures:None  Antimicrobials:  Anti-infectives (From admission, onward)   None      Subjective: Patient seen and examined at the bedside this morning.  Hemodynamically stable.  Comfortable.  He was working with physical therapy.  Wife are present at the bedside.  Denies any new complaints.  Objective: Vitals:   12/25/19 0754 12/25/19 1727 12/25/19 2313 12/26/19 0731  BP: 108/68 116/61 111/69 126/61  Pulse: 65 65 66 68  Resp: 18  18 16   Temp: 97.6 F (36.4 C) 98.1 F (36.7 C) 97.8 F (36.6 C) 97.9 F (36.6 C)  TempSrc: Oral  Oral   SpO2: 98% 99% 98% 98%  Weight:      Height:        Intake/Output Summary (Last 24 hours) at 12/26/2019 0017 Last data filed at 12/25/2019 2100 Gross per 24 hour  Intake 120 ml  Output 175 ml  Net -55 ml   Filed Weights   12/24/19 1336 12/24/19 2218  Weight: 73.9 kg 75.1 kg    Examination:  General exam: Pleasant elderly male, comfortable Respiratory system: Bilateral equal air entry, normal vesicular breath sounds Cardiovascular system: S1 & S2 heard, RRR. No JVD, murmurs, rubs, gallops or clicks. Gastrointestinal system: Abdomen is nondistended, soft and nontender. No organomegaly or masses felt. Normal bowel sounds heard. Central nervous system: Alert and oriented. No focal neurological deficits. Extremities: No edema, no clubbing ,no cyanosis Skin: No rashes, lesions or ulcers,no icterus ,no pallor   Data Reviewed: I have personally reviewed following labs and imaging studies  CBC: Recent Labs  Lab 12/21/19 1944 12/24/19 1350 12/24/19 1424  WBC 9.4 9.5  --   NEUTROABS 6.0 7.0  --   HGB 14.8 15.5 16.3  HCT 47.0 48.5 48.0  MCV 85.3 82.8  --   PLT 176 207  --    Basic Metabolic Panel: Recent Labs  Lab 12/21/19 1944 12/24/19 1350 12/24/19 1424 12/26/19 0411  NA 140 134* 137 139   K 3.9 3.9 3.9 3.9  CL 103 101 98 99  CO2 27 25  --  29  GLUCOSE 115* 179* 177* 143*  BUN 39* 33* 34* 43*  CREATININE 2.10* 1.85* 1.90* 2.05*  CALCIUM 8.9 9.2  --  9.2   GFR: Estimated Creatinine Clearance: 29 mL/min (A) (by C-G formula based on SCr of 2.05 mg/dL (H)). Liver Function Tests: Recent Labs  Lab 12/21/19 1944 12/24/19 1350  AST 18 18  ALT 21 18  ALKPHOS 91 80  BILITOT 0.3 1.0  PROT 6.3* 6.7  ALBUMIN 3.9 3.6   No results for input(s): LIPASE, AMYLASE in the last 168 hours. No results for input(s): AMMONIA in the last 168 hours. Coagulation Profile: Recent Labs  Lab 12/24/19 1350  INR 1.1   Cardiac Enzymes: No results for input(s): CKTOTAL, CKMB, CKMBINDEX, TROPONINI in the last 168 hours. BNP (last 3 results) No results for input(s): PROBNP in the last 8760 hours. HbA1C: Recent Labs    12/25/19 0257  HGBA1C 7.4*   CBG: Recent Labs  Lab 12/24/19  1433 12/25/19 1844 12/25/19 2113  GLUCAP 162* 182* 100*   Lipid Profile: Recent Labs    12/25/19 0257  CHOL 89  HDL 30*  LDLCALC 47  TRIG 60  CHOLHDL 3.0   Thyroid Function Tests: No results for input(s): TSH, T4TOTAL, FREET4, T3FREE, THYROIDAB in the last 72 hours. Anemia Panel: No results for input(s): VITAMINB12, FOLATE, FERRITIN, TIBC, IRON, RETICCTPCT in the last 72 hours. Sepsis Labs: No results for input(s): PROCALCITON, LATICACIDVEN in the last 168 hours.  Recent Results (from the past 240 hour(s))  SARS Coronavirus 2 by RT PCR (hospital order, performed in [facility] hospital lab) Nasopharyngeal Nasopharyngeal Swab     Status: None   Collection Time: 12/24/19  6:58 PM   Specimen: Nasopharyngeal Swab  Result Value Ref Range Status   SARS Coronavirus 2 NEGATIVE NEGATIVE Final    Comment: (NOTE) SARS-CoV-2 target nucleic acids are NOT DETECTED. The SARS-CoV-2 RNA is generally detectable in upper and lower respiratory specimens during the acute phase of infection. The  lowest concentration of SARS-CoV-2 viral copies this assay can detect is 250 copies / mL. A negative result does not preclude SARS-CoV-2 infection and should not be used as the sole basis for treatment or other patient management decisions.  A negative result may occur with improper specimen collection / handling, submission of specimen other than nasopharyngeal swab, presence of viral mutation(s) within the areas targeted by this assay, and inadequate number of viral copies (<250 copies / mL). A negative result must be combined with clinical observations, patient history, and epidemiological information. Fact Sheet for Patients:   StrictlyIdeas.no Fact Sheet for Healthcare Providers: BankingDealers.co.za This test is not yet approved or cleared  by the [place] FDA and has been authorized for detection and/or diagnosis of SARS-CoV-2 by FDA under an Emergency Use Authorization (EUA).  This EUA will remain in effect (meaning this test can be used) for the duration of the COVID-19 declaration under Section 564(b)(1) of the Act, 21 U.S.C. section 360bbb-3(b)(1), unless the authorization is terminated or revoked sooner. Performed at [facility], [place] [address], [place], [place] [address]          Radiology Studies: CT HEAD WO CONTRAST  Result Date: 12/24/2019 CLINICAL DATA:  Unsteady on feet, off balance since Wednesday, intermittent dizziness, fell on Thursday and Friday, possible stroke; history stroke, atrial fibrillation, hypertension, CHF, diabetes mellitus, former smoker EXAM: CT HEAD WITHOUT CONTRAST TECHNIQUE: Contiguous axial images were obtained from the base of the skull through the vertex without intravenous contrast. Sagittal and coronal MPR images reconstructed from axial data set. COMPARISON:  12/21/2019 FINDINGS: Brain: Generalized atrophy. Normal ventricular morphology. No midline shift or mass effect. Small vessel  chronic ischemic changes of deep cerebral white matter. No intracranial hemorrhage, mass lesion, evidence of acute infarction, or extra-axial fluid collection. Vascular: Atherosclerotic calcification of internal carotid and vertebral arteries at skull base. No hyperdense vessels. Skull: Intact Sinuses/Orbits: Clear Other: N/A IMPRESSION: Atrophy with small vessel chronic ischemic changes of deep cerebral white matter. No acute intracranial abnormalities. Electronically Signed   By: Lavonia Dana M.D.   On: 12/24/2019 14:15   MR ANGIO HEAD WO CONTRAST  Result Date: 12/25/2019 CLINICAL DATA:  Stroke follow-up EXAM: MRA HEAD WITHOUT CONTRAST TECHNIQUE: Angiographic images of the Circle of Willis were obtained using MRA technique without intravenous contrast. COMPARISON:  MRA of the neck 05/15/2019 FINDINGS: Chronically occluded left ICA in the neck with faint flow in the supraclinoid ICA and MCA branches.  The left A1 segment is hypoplastic but patent. No downstream branch occlusion is seen. Robust flow in the right carotid siphon and downstream branches. Chronic right V4 segment occlusion. Robust flow in the left vertebral artery. The dominant bilateral cerebellar arteries are patent and enhancing. There is an apparent filling defect in the central mid to distal basilar artery which is considered artifactual given preceding neck MRA with contrast appearance which shows complete filling of this vessel. Moderate proximal basilar and right P2 segment narrowing. IMPRESSION: 1. No emergent finding. 2. Chronic cervical left ICA and right V4 segment occlusion. 3. Moderate proximal basilar and right P2 segment stenosis. Electronically Signed   By: Monte Fantasia M.D.   On: 12/25/2019 08:40   MR ANGIO NECK W WO CONTRAST  Result Date: 12/24/2019 CLINICAL DATA:  Follow-up examination for acute stroke. EXAM: MRA NECK WITHOUT AND WITH CONTRAST TECHNIQUE: Multiplanar and multiecho pulse sequences of the neck were obtained  without and with intravenous contrast. Angiographic images of the neck were obtained using MRA technique without and with intravenous contrast. CONTRAST:  7.42m GADAVIST GADOBUTROL 1 MMOL/ML IV SOLN COMPARISON:  Prior brain MRI from earlier the same day as well as previous MRI from 05/15/2019. FINDINGS: AORTIC ARCH: Examination moderately degraded by motion artifact. Visualized aortic arch of normal caliber with normal 3 vessel morphology. No flow-limiting stenosis seen about the origin of the great vessels. RIGHT CAROTID SYSTEM: Right CCA patent from its origin to the bifurcation without flow-limiting stenosis. No significant atheromatous narrowing about the right bifurcation. Right ICA tortuous and widely patent distally to the circle-of-Willis without occlusion or other abnormality. Chronic severe stenosis at the origin of the right ECA partially visualize, similar to previous. LEFT CAROTID SYSTEM: Left CCA patent from its origin to the bifurcation without stenosis. Chronic left ICA occlusion at its origin, which remains occluded to the level of the siphon. Moderate to severe stenosis at the origin of the left ECA noted as well. VERTEBRAL ARTERIES: Both vertebral arteries arise from the subclavian arteries. No visible proximal subclavian stenosis. Moderate stenosis at the distal left subclavian artery noted (series 1037, image 9). Vertebral arteries widely patent within the neck, with the left vertebral slightly dominant. Chronic conclusion of the intracranial right V4 segment. IMPRESSION: 1. No acute vascular abnormality within the neck. 2. Chronic left ICA occlusion. 3. Chronic distal right V4 segment occlusion. 4. Chronic severe stenosis at the origin of the right ECA, with moderate to severe stenosis at the origin of the left ECA. 5. Moderate distal left subclavian artery stenosis. Electronically Signed   By: BJeannine BogaM.D.   On: 12/24/2019 23:22   MR BRAIN WO CONTRAST  Result Date:  12/24/2019 CLINICAL DATA:  Unsteady gait, 3 fall; ataxia, stroke suspected. Additional provided: Intermittent dizziness, left knee pain, several recent falls. EXAM: MRI HEAD WITHOUT CONTRAST TECHNIQUE: Multiplanar, multiecho pulse sequences of the brain and surrounding structures were obtained without intravenous contrast. COMPARISON:  Prior head CT examinations 12/24/2019 and earlier, brain MRI 05/15/2019. CT angiogram neck 11/02/2014. FINDINGS: Brain: There are few tiny cortical/subcortical foci of restricted diffusion within the posterior right frontal lobe and right parietal lobe consistent with acute infarcts (for instance as seen on series 3, images 41, 38, 37). Redemonstrated advanced generalized parenchymal atrophy. As before, there are multiple chronic lacunar infarcts within the bilateral cerebral white matter. Otherwise mild chronic small vessel ischemic disease. No evidence of intracranial mass. No chronic intracranial blood products. No extra-axial fluid collection. No midline shift. Vascular: Signal abnormality  within the V4 right vertebral artery consistent with known, chronic occlusion. Signal abnormality within the visualized distal cervical left ICA and left ICA siphon consistent with known chronic occlusion. As before there is reconstitution of the expected flow void within the paraclinoid left ICA. Skull and upper cervical spine: No focal marrow lesion. Sinuses/Orbits: Visualized orbits show no acute finding. Mild ethmoid sinus mucosal thickening. No significant mastoid effusion. IMPRESSION: Several tiny acute cortical/subcortical infarcts within the posterior right frontal lobe and right parietal lobe. As before, there are multiple chronic lacunar infarcts within the bilateral cerebral white matter. Otherwise mild chronic small vessel ischemic disease. Stable, advanced generalized parenchymal atrophy. Signal abnormality consistent with known chronic occlusion of the V4 right vertebral artery,  distal cervical left ICA and left ICA siphon. As before, there is reconstitution of the expected flow void within the paraclinoid left ICA. Mild ethmoid sinus mucosal thickening. Electronically Signed   By: Kellie Simmering DO   On: 12/24/2019 18:34   DG Knee Complete 4 Views Left  Result Date: 12/24/2019 CLINICAL DATA:  Fall.  Swelling. EXAM: LEFT KNEE - COMPLETE 4+ VIEW COMPARISON:  None. FINDINGS: There are moderate degenerative changes involving the LATERAL, MEDIAL, and patellofemoral compartments. Small joint effusion is present. No acute fracture. There is atherosclerotic calcification of the popliteal artery. IMPRESSION: Degenerative changes and small joint effusion. Electronically Signed   By: Nolon Nations M.D.   On: 12/24/2019 17:07   ECHOCARDIOGRAM COMPLETE  Result Date: 12/25/2019    ECHOCARDIOGRAM REPORT   Patient Name:   FIDENCIO DUDDY [facility] Date of Exam: 12/25/2019 Medical Rec #:  [id number]        Height:       65.0 in Accession #:    [id number]       Weight:       165.6 lb Date of Birth:  1940-10-28         BSA:          1.825 m Patient Age:    18 years         BP:           108/68 mmHg Patient Gender: M                HR:           65 bpm. Exam Location:  Inpatient Procedure: 2D Echo, Intracardiac Opacification Agent, Cardiac Doppler and Color            Doppler Indications:    Stroke  History:        Patient has prior history of Echocardiogram examinations, most                 recent 05/15/2019. Risk Factors:Diabetes and Dyslipidemia. CKD.                 S/p aortic valve replacement.  Sonographer:    [name] Lefort RDCS (AE) Referring Phys: [id number] Hubbard T TU  Sonographer Comments: Technically difficult study due to poor echo windows, suboptimal parasternal window, suboptimal apical window and no subcostal window. IMPRESSIONS  1. Left ventricular ejection fraction, by estimation, is 65 to 70%. The left ventricle has normal function. The left ventricle has no regional wall motion abnormalities. There is  moderate left ventricular hypertrophy. Left ventricular diastolic parameters are consistent with Grade I diastolic dysfunction (impaired relaxation). Elevated left ventricular end-diastolic pressure.  2. Right ventricular systolic function is low normal. The right ventricular size is normal.  3. The mitral valve is abnormal. Trivial  mitral valve regurgitation.  4. The aortic valve has been repaired/replaced with a bioprosthetic valve. Aortic valve regurgitation is not visualized. Aortic valve area, by VTI measures 2.69 cm. Aortic valve mean gradient measures 8.0 mmHg. Peak gradient is 13.5 mmHg. Aortic valve Vmax measures 1.84 m/s. Comparison(s): 05/15/2019: LVEF 60-65%, aortic valve mean gradient 13 mmHg, peak gradient 24 mmHg. FINDINGS  Left Ventricle: Left ventricular ejection fraction, by estimation, is 65 to 70%. The left ventricle has normal function. The left ventricle has no regional wall motion abnormalities. Definity contrast agent was given IV to delineate the left ventricular  endocardial borders. The left ventricular internal cavity size was normal in size. There is moderate left ventricular hypertrophy. Left ventricular diastolic parameters are consistent with Grade I diastolic dysfunction (impaired relaxation). Elevated left ventricular end-diastolic pressure. Right Ventricle: The right ventricular size is normal. No increase in right ventricular wall thickness. Right ventricular systolic function is low normal. Left Atrium: Left atrial size was normal in size. Right Atrium: Right atrial size was normal in size. Pericardium: There is no evidence of pericardial effusion. Mitral Valve: The mitral valve is abnormal. There is mild thickening of the mitral valve leaflet(s). Mild to moderate mitral annular calcification. Trivial mitral valve regurgitation. MV peak gradient, 4.2 mmHg. The mean mitral valve gradient is 2.0 mmHg. Tricuspid Valve: The tricuspid valve is not well visualized. Tricuspid valve  regurgitation is not demonstrated. Aortic Valve: The aortic valve has been repaired/replaced. Aortic valve regurgitation is not visualized. Aortic valve mean gradient measures 8.0 mmHg. There is a unknown bioprosthetic valve present in the aortic position. Pulmonic Valve: The pulmonic valve was not well visualized. Pulmonic valve regurgitation is not visualized. Aorta: The aortic root and ascending aorta are structurally normal, with no evidence of dilitation. Venous: The inferior vena cava was not well visualized. IAS/Shunts: No atrial level shunt detected by color flow Doppler.  LEFT VENTRICLE PLAX 2D LVIDd:         4.00 cm  Diastology LVIDs:         2.39 cm  LV e' lateral:   6.75 cm/s LV PW:         1.40 cm  LV E/e' lateral: 15.7 LV IVS:        1.25 cm  LV e' medial:    5.11 cm/s LVOT diam:     2.40 cm  LV E/e' medial:  20.7 LV SV:         84 LV SV Index:   46 LVOT Area:     4.52 cm  RIGHT VENTRICLE RV Basal diam:  2.98 cm RV S prime:     8.87 cm/s LEFT ATRIUM             Index       RIGHT ATRIUM           Index LA diam:        2.60 cm 1.42 cm/m  RA Area:     14.50 cm LA Vol (A2C):   50.2 ml 27.50 ml/m RA Volume:   33.40 ml  18.30 ml/m LA Vol (A4C):   55.8 ml 30.57 ml/m LA Biplane Vol: 55.2 ml 30.24 ml/m  AORTIC VALVE AV Area (Vmax):    2.32 cm AV Area (Vmean):   2.37 cm AV Area (VTI):     2.69 cm AV Vmax:           184.00 cm/s AV Vmean:          121.000 cm/s AV VTI:  0.313 m AV Peak Grad:      13.5 mmHg AV Mean Grad:      8.0 mmHg LVOT Vmax:         94.20 cm/s LVOT Vmean:        63.500 cm/s LVOT VTI:          0.186 m LVOT/AV VTI ratio: 0.59  AORTA Ao Root diam: 2.30 cm MITRAL VALVE MV Area (PHT): 3.12 cm     SHUNTS MV Peak grad:  4.2 mmHg     Systemic VTI:  0.19 m MV Mean grad:  2.0 mmHg     Systemic Diam: 2.40 cm MV Vmax:       1.03 m/s MV Vmean:      55.9 cm/s MV Decel Time: 243 msec MV E velocity: 106.00 cm/s MV A velocity: 96.80 cm/s MV E/A ratio:  1.10 Lyman Bishop MD Electronically  signed by Lyman Bishop MD Signature Date/Time: 12/25/2019/4:45:22 PM    Final         Scheduled Meds: .  stroke: mapping our early stages of recovery book   Does not apply Once  . aspirin EC  81 mg Oral Daily  . atorvastatin  40 mg Oral QHS  . enoxaparin (LOVENOX) injection  40 mg Subcutaneous Q24H  . hydrochlorothiazide  25 mg Oral Daily  . insulin aspart  0-15 Units Subcutaneous TID WC  . insulin aspart  0-5 Units Subcutaneous QHS  . lisinopril  40 mg Oral Daily  . metoprolol succinate  100 mg Oral Daily   Continuous Infusions:   LOS: 2 days    Time spent: 35 mins.More than 50% of that time was spent in counseling and/or coordination of care.      Shelly Coss, MD [facility] P6/02/2020, 8:12 AM

## 2019-12-26 NOTE — TOC Benefit Eligibility Note (Signed)
Transition of Care [facility]) Benefit Eligibility Note    Patient Details  Name: Evan Stanley MRN: [id number] Date of Birth: 26-Jun-1941   Medication/Dose: Arne Cleveland  2.5 MG BID   CO-PAY- $47.00    and    ELIQUIS  5 MG BID   CO-PAY- $47.00  Covered?: Yes  Tier: 3 Drug  Prescription Coverage Preferred Pharmacy: [facility]  Spoke with Person/Company/Phone Number:: JANA  @ [employer] # [id number]  Co-Pay: $47.00  Prior Approval: No  Deductible: Unmet(OUY-OF-POCKET:UNMET)  Additional Notes: Q/L TWO PILL PER DAY    Memory Argue Phone Number: 12/26/2019, 5:48 PM

## 2019-12-26 NOTE — Discharge Instructions (Signed)

## 2019-12-26 NOTE — Progress Notes (Signed)
ANTICOAGULATION CONSULT NOTE - Initial Consult  Pharmacy Consult for Apixaban Indication: atrial fibrillation  No Known Allergies  Patient Measurements: Height: 5' 5"  (165.1 cm) Weight: 75.1 kg (165 lb 9.1 oz) IBW/kg (Calculated) : 61.5  Vital Signs: Temp: 97.9 F (36.6 C) (06/08 0731) Temp Source: Oral (06/07 2313) BP: 126/61 (06/08 0731) Pulse Rate: 68 (06/08 0731)  Labs: Recent Labs    12/24/19 1350 12/24/19 1424 12/26/19 0411  HGB 15.5 16.3  --   HCT 48.5 48.0  --   PLT 207  --   --   APTT 29  --   --   LABPROT 13.4  --   --   INR 1.1  --   --   CREATININE 1.85* 1.90* 2.05*    Estimated Creatinine Clearance: 29 mL/min (A) (by C-G formula based on SCr of 2.05 mg/dL (H)).   Medical History: Past Medical History:  Diagnosis Date  . AKI (acute kidney injury) ([place])   . Allergy   . Anxiety   . Arthritis    Right shoulder  . Atrial fibrillation ([place])   . Carotid artery stenosis   . Cataract   . CHF (congestive heart failure) ([place])   . CVA (cerebral vascular accident) ([place]) 04/28/2016  . Decreased vision    left eye  . Diabetes mellitus without complication ([place])    Takes Metformin  . Hyperlipidemia   . Hypertension   . Salmonella bacteremia 04/29/2016  . Sepsis ([place]) 04/29/2016  . Stroke ([place]) 11/18/2015   no deficits  . TIA (transient ischemic attack)    affected left eye  . Urgency of urination     Assessment: 79 yo male with a history of afib ad mitted for a recurrent acute CVA.  CHA2DS2-VASc is 7. Pharmacy consulted to start apixaban therapy. Age <80, Weight >60kg, Scr >1.5 - patient does not meet criteria for reduced dose apixaban.   Plan:  Start Apixaban 5 mg po bid Monitor for signs and symptoms of bleeding   [name], PharmD, [facility] Clinical Pharmacist Please see AMION for all Pharmacists' Contact Phone Numbers 12/26/2019, 8:44 AM

## 2019-12-26 NOTE — Progress Notes (Signed)
Inpatient Rehab Admissions:  Inpatient Rehab Consult received.  I met with patient at the bedside for rehabilitation assessment and to discuss goals and expectations of an inpatient rehab admission.  He said he is hopeful to be able to return home and pleased with his mobility progression while in the acute setting.  Will f/u with pt after therapy tomorrow to confirm, but for now would appear that he is appropriate for home health.   Signed: Shann Medal, PT, DPT Admissions Coordinator [phone] 12/26/19  3:39 PM

## 2019-12-26 NOTE — Plan of Care (Signed)
  Problem: Consults Goal: Venous Thromboembolism Patient Education Description: See Patient Education Module for education specifics. Outcome: Progressing Goal: Diagnosis - Venous Thromboembolism (VTE) Description: Choose a selection Outcome: Progressing Goal: Skin Care Protocol Initiated - if Braden Score 18 or less Description: If consults are not indicated, leave blank or document N/A Outcome: Progressing Goal: Diabetes Guidelines if Diabetic/Glucose > 140 Description: If diabetic or lab glucose is > 140 mg/dl - Initiate Diabetes/Hyperglycemia Guidelines & Document Interventions  Outcome: Progressing   Problem: Phase I Progression Outcomes Goal: Pain controlled with appropriate interventions Outcome: Progressing Goal: Dyspnea controlled at rest (PE) Outcome: Progressing Goal: Tolerating diet Outcome: Progressing Goal: Initial discharge plan identified Outcome: Progressing Goal: Voiding-avoid urinary catheter unless indicated Outcome: Progressing Goal: Hemodynamically stable Outcome: Progressing Goal: Other Phase I Outcomes/Goals Outcome: Progressing   Problem: Phase II Progression Outcomes Goal: Therapeutic drug levels for anticoagulation Outcome: Progressing Goal: 02 sats trending upward/stable (PE) Outcome: Progressing Goal: Discharge plan established Outcome: Progressing Goal: Tolerating diet Outcome: Progressing Goal: Other Phase II Outcomes/Goals Outcome: Progressing   Problem: Phase III Progression Outcomes Goal: 02 sats stabilized Outcome: Progressing Goal: Activity at appropriate level-compared to baseline Description: (UP IN CHAIR FOR HEMODIALYSIS) Outcome: Progressing Goal: Discharge plan remains appropriate-arrangements made Outcome: Progressing Goal: Other Phase III Outcomes/Goals Outcome: Progressing   Problem: Discharge Progression Outcomes Goal: Barriers To Progression Addressed/Resolved Outcome: Progressing Goal: Discharge plan in place and  appropriate Outcome: Progressing Goal: Pain controlled with appropriate interventions Outcome: Progressing Goal: Hemodynamically stable Outcome: Progressing Goal: Complications resolved/controlled Outcome: Progressing Goal: Tolerating diet Outcome: Progressing Goal: Activity appropriate for discharge plan Outcome: Progressing Goal: Other Discharge Outcomes/Goals Outcome: Progressing

## 2019-12-26 NOTE — Progress Notes (Signed)
Physical Therapy Treatment Patient Details Name: Rafi Kenneth MRN: [id number] DOB: Feb 05, 1941 Today's Date: 12/26/2019    History of Present Illness Maylon Sailors is a 79 y.o. 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 MRI showed several tiny acute cortical/subcortical infarcts within the posterior right frontal lobe and right parietal lobe.    PT Comments    Pt with good mobility progression this session, ambulating hallway distance with only 1 period of unsteadiness with initial standing prior to initiating gait. Pt needed to have BM during session, able to perform own pericare this day. Pt still limited by L knee pain and mild weakness in LLE vs RLE, but overall is requiring less physical assist and is progressing well with mobility. Pt may progress to HHPT level, PT to reassess tomorrow to determine most appropriate d/c plan. Pt's wife is nervous about possibility of pt's d/c home, but is more open to idea when she observes session and decreased assist level vs yesterday.    Follow Up Recommendations  CIR     Equipment Recommendations  None recommended by PT    Recommendations for Other Services       Precautions / Restrictions Precautions Precautions: Fall Restrictions Weight Bearing Restrictions: No    Mobility  Bed Mobility Overal bed mobility: Needs Assistance Bed Mobility: Rolling;Sidelying to Sit Rolling: Supervision Sidelying to sit: Min assist;HOB elevated       General bed mobility comments: supervision for roll to R, min assist for sidelying to sit for trunk elevation. Pt with use of bedrails to come to sitting.  Transfers Overall transfer level: Needs assistance Equipment used:  Rolling walker (2 wheeled) Transfers: Sit to/from Stand Sit to Stand: Min guard;Min assist         General transfer comment: min assist for first stand attempt for steadying, as pt with posterior leaning upon standing. Subsequent sit to stands required min guard for safety, pt self-cuing hand placement when rising and sitting, PT cuing pt to back up to sitting surface until he feels it on legs prior to initiating sit. Sit to stand x4, from bed/chair in hallway/recliner in room/toilet.  Ambulation/Gait Ambulation/Gait assistance: Min guard;Min assist Gait Distance (Feet): 75 Feet(2x75 ft, +30 ft to and from bathroom) Assistive device: Rolling walker (2 wheeled);1 person hand held assist Gait Pattern/deviations: Step-through pattern;Decreased stride length;Trunk flexed;Decreased stance time - left;Decreased weight shift to left;Antalgic Gait velocity: decr   General Gait Details: Min guard for safety, min assist with HHA when walking short distance ~10 ft without use of AD. Verbal cuing for upright posture, sequencing with LLE first to offweight as needed due to pain   Stairs             Wheelchair Mobility    Modified Rankin (Stroke Patients Only) Modified Rankin (Stroke Patients Only) Pre-Morbid Rankin Score: No symptoms Modified Rankin: Moderately severe disability     Balance Overall balance assessment: Needs assistance Sitting-balance support: Single extremity supported;Feet supported Sitting balance-Leahy Scale: Good     Standing balance support: Single extremity supported;During functional activity Standing balance-Leahy Scale: Poor Standing balance comment: reliant on RW or external support from PT  Cognition Arousal/Alertness: Awake/alert Behavior During Therapy: WFL for tasks assessed/performed Overall Cognitive Status: Impaired/Different from baseline Area of Impairment: Attention;Safety/judgement;Awareness;Problem  solving;Following commands                   Current Attention Level: Selective   Following Commands: Follows multi-step commands with increased time Safety/Judgement: Decreased awareness of safety;Decreased awareness of deficits Awareness: Emergent Problem Solving: Requires verbal cues;Requires tactile cues General Comments: A&Ox4, requires min cuing for safety during mobility. Pt recalls sitting and standing safety cues from yesterday's session      Exercises      General Comments General comments (skin integrity, edema, etc.): pt's wife present      Pertinent Vitals/Pain Pain Assessment: Faces Faces Pain Scale: Hurts little more Pain Location: left knee and thigh, posteriorly with knee flexion >90* Pain Descriptors / Indicators: Sore;Discomfort;Grimacing Pain Intervention(s): Limited activity within patient's tolerance;Monitored during session;Repositioned    Home Living                      Prior Function            PT Goals (current goals can now be found in the care plan section) Acute Rehab PT Goals Patient Stated Goal: return to independence PT Goal Formulation: With patient Time For Goal Achievement: 01/08/20 Potential to Achieve Goals: Good Progress towards PT goals: Progressing toward goals    Frequency    Min 4X/week      PT Plan Current plan remains appropriate    Co-evaluation              AM-PAC PT "6 Clicks" Mobility   Outcome Measure  Help needed turning from your back to your side while in a flat bed without using bedrails?: A Little Help needed moving from lying on your back to sitting on the side of a flat bed without using bedrails?: A Little Help needed moving to and from a bed to a chair (including a wheelchair)?: A Little Help needed standing up from a chair using your arms (e.g., wheelchair or bedside chair)?: A Little Help needed to walk in hospital room?: A Little Help needed climbing 3-5 steps with a  railing? : A Lot 6 Click Score: 17    End of Session Equipment Utilized During Treatment: Gait belt Activity Tolerance: Patient tolerated treatment well Patient left: with call bell/phone within reach;with family/visitor present;in chair;with chair alarm set Nurse Communication: Mobility status PT Visit Diagnosis: Hemiplegia and hemiparesis;Other abnormalities of gait and mobility (R26.89) Hemiplegia - Right/Left: Left Hemiplegia - dominant/non-dominant: Non-dominant Hemiplegia - caused by: Cerebral infarction     Time: 0921-1008(-10 minutes not included in chargable time for BM) PT Time Calculation (min) (ACUTE ONLY): 47 min  Charges:  $Gait Training: 8-22 mins $Therapeutic Activity: 8-22 mins                     Jaquala Fuller E, PT Acute Rehabilitation Services Pager [phone]  Office [phone]  Jayla Mackie D Klye Besecker 12/26/2019, 11:07 AM

## 2019-12-26 NOTE — Progress Notes (Signed)
STROKE TEAM PROGRESS NOTE   INTERVAL HISTORY No one at the bedside.  He is up in the chair at the bedside.    He still has persistent mild left leg weakness.  Cardiology has seen him and recommend Eliquis for anticoagulation.  Echocardiogram shows normal ejection fraction..  No new complaints  Vitals:   12/25/19 0754 12/25/19 1727 12/25/19 2313 12/26/19 0731  BP: 108/68 116/61 111/69 126/61  Pulse: 65 65 66 68  Resp: 18  18 16   Temp: 97.6 F (36.4 C) 98.1 F (36.7 C) 97.8 F (36.6 C) 97.9 F (36.6 C)  TempSrc: Oral  Oral   SpO2: 98% 99% 98% 98%  Weight:      Height:        CBC:  Recent Labs  Lab 12/21/19 1944 12/21/19 1944 12/24/19 1350 12/24/19 1424  WBC 9.4  --  9.5  --   NEUTROABS 6.0  --  7.0  --   HGB 14.8   < > 15.5 16.3  HCT 47.0   < > 48.5 48.0  MCV 85.3  --  82.8  --   PLT 176  --  207  --    < > = values in this interval not displayed.    Basic Metabolic Panel:  Recent Labs  Lab 12/24/19 1350 12/24/19 1350 12/24/19 1424 12/26/19 0411  NA 134*   < > 137 139  K 3.9   < > 3.9 3.9  CL 101   < > 98 99  CO2 25  --   --  29  GLUCOSE 179*   < > 177* 143*  BUN 33*   < > 34* 43*  CREATININE 1.85*   < > 1.90* 2.05*  CALCIUM 9.2  --   --  9.2   < > = values in this interval not displayed.   Lipid Panel:     Component Value Date/Time   CHOL 89 12/25/2019 0257   CHOL 100 08/23/2017 0850   TRIG 60 12/25/2019 0257   HDL 30 (L) 12/25/2019 0257   HDL 32 (L) 08/23/2017 0850   CHOLHDL 3.0 12/25/2019 0257   VLDL 12 12/25/2019 0257   LDLCALC 47 12/25/2019 0257   LDLCALC 49 08/23/2017 0850   HgbA1c:  Lab Results  Component Value Date   HGBA1C 7.4 (H) 12/25/2019   Urine Drug Screen:     Component Value Date/Time   LABOPIA NONE DETECTED 05/13/2019 2045   COCAINSCRNUR NONE DETECTED 05/13/2019 2045   LABBENZ NONE DETECTED 05/13/2019 2045   AMPHETMU NONE DETECTED 05/13/2019 2045   THCU NONE DETECTED 05/13/2019 2045   LABBARB NONE DETECTED 05/13/2019 2045     Alcohol Level     Component Value Date/Time   ETH <10 05/13/2019 1645    IMAGING past 24 hours ECHOCARDIOGRAM COMPLETE  Result Date: 12/25/2019    ECHOCARDIOGRAM REPORT   Patient Name:   SHAMAR ENGELMANN Date of Exam: 12/25/2019 Medical Rec #:  [id number]        Height:       65.0 in Accession #:    [id number]       Weight:       165.6 lb Date of Birth:  [birth year]         BSA:          1.825 m Patient Age:    41 years         BP:           108/68 mmHg Patient Gender:  M                HR:           65 bpm. Exam Location:  Inpatient Procedure: 2D Echo, Intracardiac Opacification Agent, Cardiac Doppler and Color            Doppler Indications:    Stroke  History:        Patient has prior history of Echocardiogram examinations, most                 recent 05/15/2019. Risk Factors:Diabetes and Dyslipidemia. CKD.                 S/p aortic valve replacement.  Sonographer:    [name] Lefort RDCS (AE) Referring Phys: [id number] [place] T TU  Sonographer Comments: Technically difficult study due to poor echo windows, suboptimal parasternal window, suboptimal apical window and no subcostal window. IMPRESSIONS  1. Left ventricular ejection fraction, by estimation, is 65 to 70%. The left ventricle has normal function. The left ventricle has no regional wall motion abnormalities. There is moderate left ventricular hypertrophy. Left ventricular diastolic parameters are consistent with Grade I diastolic dysfunction (impaired relaxation). Elevated left ventricular end-diastolic pressure.  2. Right ventricular systolic function is low normal. The right ventricular size is normal.  3. The mitral valve is abnormal. Trivial mitral valve regurgitation.  4. The aortic valve has been repaired/replaced with a bioprosthetic valve. Aortic valve regurgitation is not visualized. Aortic valve area, by VTI measures 2.69 cm. Aortic valve mean gradient measures 8.0 mmHg. Peak gradient is 13.5 mmHg. Aortic valve Vmax measures 1.84 m/s.  Comparison(s): 05/15/2019: LVEF 60-65%, aortic valve mean gradient 13 mmHg, peak gradient 24 mmHg. FINDINGS  Left Ventricle: Left ventricular ejection fraction, by estimation, is 65 to 70%. The left ventricle has normal function. The left ventricle has no regional wall motion abnormalities. Definity contrast agent was given IV to delineate the left ventricular  endocardial borders. The left ventricular internal cavity size was normal in size. There is moderate left ventricular hypertrophy. Left ventricular diastolic parameters are consistent with Grade I diastolic dysfunction (impaired relaxation). Elevated left ventricular end-diastolic pressure. Right Ventricle: The right ventricular size is normal. No increase in right ventricular wall thickness. Right ventricular systolic function is low normal. Left Atrium: Left atrial size was normal in size. Right Atrium: Right atrial size was normal in size. Pericardium: There is no evidence of pericardial effusion. Mitral Valve: The mitral valve is abnormal. There is mild thickening of the mitral valve leaflet(s). Mild to moderate mitral annular calcification. Trivial mitral valve regurgitation. MV peak gradient, 4.2 mmHg. The mean mitral valve gradient is 2.0 mmHg. Tricuspid Valve: The tricuspid valve is not well visualized. Tricuspid valve regurgitation is not demonstrated. Aortic Valve: The aortic valve has been repaired/replaced. Aortic valve regurgitation is not visualized. Aortic valve mean gradient measures 8.0 mmHg. There is a unknown bioprosthetic valve present in the aortic position. Pulmonic Valve: The pulmonic valve was not well visualized. Pulmonic valve regurgitation is not visualized. Aorta: The aortic root and ascending aorta are structurally normal, with no evidence of dilitation. Venous: The inferior vena cava was not well visualized. IAS/Shunts: No atrial level shunt detected by color flow Doppler.  LEFT VENTRICLE PLAX 2D LVIDd:         4.00 cm   Diastology LVIDs:         2.39 cm  LV e' lateral:   6.75 cm/s LV PW:  1.40 cm  LV E/e' lateral: 15.7 LV IVS:        1.25 cm  LV e' medial:    5.11 cm/s LVOT diam:     2.40 cm  LV E/e' medial:  20.7 LV SV:         84 LV SV Index:   46 LVOT Area:     4.52 cm  RIGHT VENTRICLE RV Basal diam:  2.98 cm RV S prime:     8.87 cm/s LEFT ATRIUM             Index       RIGHT ATRIUM           Index LA diam:        2.60 cm 1.42 cm/m  RA Area:     14.50 cm LA Vol (A2C):   50.2 ml 27.50 ml/m RA Volume:   33.40 ml  18.30 ml/m LA Vol (A4C):   55.8 ml 30.57 ml/m LA Biplane Vol: 55.2 ml 30.24 ml/m  AORTIC VALVE AV Area (Vmax):    2.32 cm AV Area (Vmean):   2.37 cm AV Area (VTI):     2.69 cm AV Vmax:           184.00 cm/s AV Vmean:          121.000 cm/s AV VTI:            0.313 m AV Peak Grad:      13.5 mmHg AV Mean Grad:      8.0 mmHg LVOT Vmax:         94.20 cm/s LVOT Vmean:        63.500 cm/s LVOT VTI:          0.186 m LVOT/AV VTI ratio: 0.59  AORTA Ao Root diam: 2.30 cm MITRAL VALVE MV Area (PHT): 3.12 cm     SHUNTS MV Peak grad:  4.2 mmHg     Systemic VTI:  0.19 m MV Mean grad:  2.0 mmHg     Systemic Diam: 2.40 cm MV Vmax:       1.03 m/s MV Vmean:      55.9 cm/s MV Decel Time: 243 msec MV E velocity: 106.00 cm/s MV A velocity: 96.80 cm/s MV E/A ratio:  1.10 Lyman Bishop MD Electronically signed by Lyman Bishop MD Signature Date/Time: 12/25/2019/4:45:22 PM    Final     PHYSICAL EXAM Pleasant elderly Caucasian male not in distress. . Afebrile. Head is nontraumatic. Neck is supple without bruit.    Cardiac exam no murmur or gallop. Lungs are clear to auscultation. Distal pulses are well felt. Neurological Exam ;  Awake  Alert oriented x 3. Normal speech and language.eye movements full without nystagmus.fundi were not visualized. Vision acuity and fields appear normal. Hearing is normal. Palatal movements are normal. Face symmetric. Tongue midline. Normal strength, tone, reflexes and coordination except mild  left lower extremity drift and mild weakness of left hip flexors and ankle dorsiflexors.. Normal sensation. Gait deferred.  ASSESSMENT/PLAN [name] is a 79 y.o. male with history of right carotid dissection with small strokes affecting the right hemisphere without any residual deficits, listed atrial fibrillation in problem list but not on anticoagulation, chronically occluded left ICA, diabetes, hypertension, hyperlipidemia, blindness in the left eye, CAD status post CABG with AVR, peripheral vascular disease presenting with recurrent falls since Wed with altered gait. .   Stroke:   Several R frontal lobe and R parietal lobe cortical / subcortical infarcts  in setting of multiple chronic intracranial stenoses/ occlusion, current infarcts felt to be  embolic secondary to unclear source. Possible AF, formal dx unclear.   CT head No acute abnormality. Small vessel disease. Atrophy.   MRI  Several tiny cortical / subcortical infarcts posterior R frontal lobe and R parietal lobe. Multiple chronic lacunes B white matter. Small vessel disease.   MRA Head Known R V4, L ICA and L ICA siphon occlusion. Reconstitution paraclinoid L ICA. Sinus dz  MRA neck chronic L ICA, distal R V4 occlusions. R ECA origin severe stenosis. origin L ECA moderate to severe stenosis. Distal L subclavian moderate stenosis.   2D Echo normal ejection fraction.  LDL 47  HgbA1c 7.4  Lovenox 40 mg sq daily for VTE prophylaxis  aspirin 81 mg daily prior to admission, now on aspirin 81 mg daily.  Recommend change to Eliquis  Therapy recommendations:  CIR  Disposition: CLR  ? AF hx   AF listed in problem list but not in any cardiology notes  No EKGs in epic w/ documented AF  Multiple incidences of pt's refusal of AC, but no documentation of actual AF  ? AF post valve replacement. Not mentioned in d/c summary   No confirmed dx AF  Carotid disease  Right Carotid stenosis > 80% s/p R CEA 12/2013 Dr.  Winona Legato ECA occluded  L ICA occluded  Hypertension  Stable . Permissive hypertension (OK if < 220/120) but gradually normalize in 5-7 days . Long-term BP goal normotensive  Hyperlipidemia  Home meds:  lipitor 40, resumed in hospital  LDL 47, goal < 70  Continue statin at discharge  Diabetes type II Uncontrolled  HgbA1c 7.4, goal < 7.0  Other Stroke Risk Factors  Advanced age  Former Cigarette smoker, quit 33 yrs ago  Hx stroke/TIA  11/2015 - Bi-cerebral infarcts right greater than left s/p diagnostic carotid arteriogram by vascular surgeon with R ICA > 80% and L ICA occlusion. Presented w/ L HP. Treated w/ aspirin, statin. Known dx AF at that time, pt refused AC.    Family hx stroke (father)  CAD s/p CABG  Hx Congestive heart failure  Known carotid occlusion   S/p AV replacement w/ bioprosthetic valve  Other Active Problems  Hx L CRAO noted during laser surgery 10/2015 w/ Dr. Zadie Rhine  L knee pain  CKD stage IIIb  Hospital day # 2  He has presented with left leg weakness due to embolic right medial frontal ACA branch infarct.  There is remote history of atrial fibrillation but not clearly documented in the chart.  Recommend long-term anticoagulation for stroke prevention with Eliquis.     Aggressive risk factor modification.  Long discussion with patient,, Dr.Adhikari and answered questions.   Stroke team will sign off.  Follow-up as outpatient stroke clinic in 6 weeks.  Kindly call for questions if any.[name], MD Medical Director [place] Pager: [phone] 12/26/2019 2:11 PM To contact Stroke Continuity provider, please refer to [email or web address] After hours, contact General Neurology

## 2019-12-27 ENCOUNTER — Telehealth: Payer: Self-pay | Admitting: Family Medicine

## 2019-12-27 LAB — GLUCOSE, CAPILLARY
Glucose-Capillary: 151 mg/dL — ABNORMAL HIGH (ref 70–99)
Glucose-Capillary: 192 mg/dL — ABNORMAL HIGH (ref 70–99)

## 2019-12-27 MED ORDER — APIXABAN 5 MG PO TABS: 5.0000 mg | ORAL_TABLET | Freq: Two times a day (BID) | ORAL | 3 refills | Status: DC

## 2019-12-27 MED ORDER — APIXABAN 5 MG PO TABS
5.0000 mg | ORAL_TABLET | Freq: Two times a day (BID) | ORAL | 3 refills | Status: DC
Start: 2020-01-26 — End: 2020-01-04

## 2019-12-27 NOTE — TOC Transition Note (Signed)
Transition of Care [facility]) - CM/SW Discharge Note   Patient Details  Name: Evan Stanley MRN: [id number] Date of Birth: [birth year]  [facility] CM/SW Contact:  Emeterio Reeve, [place] Phone Number: 12/27/2019, 2:40 PM   Clinical Narrative:     PT will be discharging home with [facility] with [facility]. DME was not recommended by pt/ot. Pt will be returning him with wife via car.   Final next level of care: Home w Home Health Services Barriers to Discharge: Barriers Resolved   Patient Goals and CMS Choice Patient states their goals for this hospitalization and ongoing recovery are:: return home      Discharge Placement                Patient to be transferred to facility by: Car with wife. Name of family member notified: Wife Patient and family notified of of transfer: 12/27/19  Discharge Plan and Services                          HH Arranged: PT, OT [facility] Agency: [place] Date Show Low: 12/27/19 Time [place]: 1440 Representative spoke with at [place]: Rem[place] ([place]) Interventions     Readmission Risk Interventions No flowsheet data found. Emeterio Reeve, Latanya Presser, [place] Social Worker [phone]

## 2019-12-27 NOTE — Consult Note (Signed)
   [facility] Inpatient Consult   12/27/2019  Evan Stanley May 16, 1941 [id number]   Patient was screened for [facility] services. Patient will have the transition of care [TOC]  call conducted by the primary care provider's office,Gottschalk, Koleen Distance, DO, with [facility]  . This patient is also in an Embedded practice which has a chronic disease management Embedded Care Management team.  Plan: Notification sent to the [facility] team member and made aware of above needs.   Please contact for further questions,  Natividad Brood, RN BSN [facility] Liaison  [phone] business mobile phone Toll free office [phone]  Fax number: [phone] [email or web address]

## 2019-12-27 NOTE — Plan of Care (Signed)
  Problem: Consults Goal: Venous Thromboembolism Patient Education Description: See Patient Education Module for education specifics. Outcome: Progressing Goal: Diagnosis - Venous Thromboembolism (VTE) Description: Choose a selection Outcome: Progressing Goal: Skin Care Protocol Initiated - if Braden Score 18 or less Description: If consults are not indicated, leave blank or document N/A Outcome: Progressing Goal: Diabetes Guidelines if Diabetic/Glucose > 140 Description: If diabetic or lab glucose is > 140 mg/dl - Initiate Diabetes/Hyperglycemia Guidelines & Document Interventions  Outcome: Progressing   Problem: Phase I Progression Outcomes Goal: Pain controlled with appropriate interventions Outcome: Progressing Goal: Dyspnea controlled at rest (PE) Outcome: Progressing Goal: Tolerating diet Outcome: Progressing Goal: Initial discharge plan identified Outcome: Progressing Goal: Voiding-avoid urinary catheter unless indicated Outcome: Progressing Goal: Hemodynamically stable Outcome: Progressing Goal: Other Phase I Outcomes/Goals Outcome: Progressing   Problem: Phase II Progression Outcomes Goal: Therapeutic drug levels for anticoagulation Outcome: Progressing Goal: 02 sats trending upward/stable (PE) Outcome: Progressing Goal: Discharge plan established Outcome: Progressing Goal: Tolerating diet Outcome: Progressing Goal: Other Phase II Outcomes/Goals Outcome: Progressing   Problem: Phase III Progression Outcomes Goal: 02 sats stabilized Outcome: Progressing Goal: Activity at appropriate level-compared to baseline Description: (UP IN CHAIR FOR HEMODIALYSIS) Outcome: Progressing Goal: Discharge plan remains appropriate-arrangements made Outcome: Progressing Goal: Other Phase III Outcomes/Goals Outcome: Progressing   Problem: Discharge Progression Outcomes Goal: Barriers To Progression Addressed/Resolved Outcome: Progressing Goal: Discharge plan in place and  appropriate Outcome: Progressing Goal: Pain controlled with appropriate interventions Outcome: Progressing Goal: Hemodynamically stable Outcome: Progressing Goal: Complications resolved/controlled Outcome: Progressing Goal: Tolerating diet Outcome: Progressing Goal: Activity appropriate for discharge plan Outcome: Progressing Goal: Other Discharge Outcomes/Goals Outcome: Progressing

## 2019-12-27 NOTE — Progress Notes (Signed)
Physical Therapy Treatment Patient Details Name: Evan Stanley MRN: [id number] DOB: 04-Aug-1940 Today's Date: 12/27/2019    History of Present Illness Evan Stanley is a 79 y.o. 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 MRI showed several tiny acute cortical/subcortical infarcts within the posterior right frontal lobe and right parietal lobe.    PT Comments    Pt with much improved ambulation distance and tolerance this session, requiring 1 seated rest break for short period but ambulated 200 ft with supervision to min guard level of assist. Pt performed all mobility including transfers, sink tasks, and gait with no physical assist from PT, and pt demonstrates good carryover of safety cues from yesterday's PT session. Pt performed 5 time sit to stand test in 33 seconds, revealing pt is at increased risk of falls. PT educated pt on the importance of using RW, keeping RW around self during gait especially when preparing to sit, and not walking without RW until cleared to do so by HHPT. Pt's wife also educated on supervision assist for pt for first couple of weeks at home, pt and wife agreeable. PT recommending HHPT to address mobility deficits, and ensure safe home set up to decrease pt fall risk at home. PT notified CIR of recommendation change, pt and family are eager for pt to d/c home.    Follow Up Recommendations  Home health PT;Supervision/Assistance - 24 hour     Equipment Recommendations  None recommended by PT    Recommendations for Other Services       Precautions / Restrictions Precautions Precautions: Fall Restrictions Weight Bearing Restrictions: No    Mobility  Bed Mobility                General bed mobility comments: sitting in recliner upon arrival  Transfers Overall transfer level: Needs assistance Equipment used: Rolling walker (2 wheeled) Transfers: Sit to/from Stand Sit to Stand: Supervision         General transfer comment: for safety only, pt able to state and demonstrate proper hand placement when rising and sitting.  Ambulation/Gait Ambulation/Gait assistance: Min guard Gait Distance (Feet): 200 Feet(x1 seated rest break in hallway x1 minute) Assistive device: Rolling walker (2 wheeled) Gait Pattern/deviations: Step-through pattern;Decreased stride length;Trunk flexed;Decreased stance time - left;Decreased weight shift to left;Antalgic Gait velocity: decr   General Gait Details: Min guard to supervision level of assist for safety, verbal cuing for placement in RW. Pt navigating hallway obstacles well, slight drift towards R that pt is aware of with RW and is able to correct.   Stairs             Wheelchair Mobility    Modified Rankin (Stroke Patients Only) Modified Rankin (Stroke Patients Only) Pre-Morbid Rankin Score: No symptoms Modified Rankin: Moderately severe disability     Balance Overall balance assessment: Needs assistance Sitting-balance support: Single extremity supported;Feet supported Sitting balance-Leahy Scale: Good     Standing balance support: No upper extremity supported;During functional activity Standing balance-Leahy Scale: Fair Standing balance comment: reliant on RW during gait, can stand statically without UE support while washing hands               High Level Balance Comments: 5 time sit to stand test:  33.25 seconds to complete, no use of UEs            Cognition Arousal/Alertness: Awake/alert Behavior During Therapy: WFL for tasks assessed/performed Overall Cognitive Status: Within Functional Limits for tasks assessed                                 General Comments: pt  demonstrated improvement with cognitive processing speed, short term memory and problem solving this session;pt demonstrating good carry over of safety from previous sessions. pt pleasant throughout today's session.       Exercises      General Comments General comments (skin integrity, edema, etc.): wife present throughout, reports she feels comfortable with pt discharging home      Pertinent Vitals/Pain Pain Assessment: No/denies pain Faces Pain Scale: Hurts a little bit Pain Location: L knee, posteriorly during knee flexion Pain Descriptors / Indicators: Aching;Sore Pain Intervention(s): Limited activity within patient's tolerance;Monitored during session;Repositioned    Home Living                      Prior Function            PT Goals (current goals can now be found in the care plan section) Acute Rehab PT Goals Patient Stated Goal: return to independence PT Goal Formulation: With patient Time For Goal Achievement: 01/08/20 Potential to Achieve Goals: Good Progress towards PT goals: Progressing toward goals    Frequency    Min 4X/week      PT Plan Discharge plan needs to be updated    Co-evaluation              AM-PAC PT "6 Clicks" Mobility   Outcome Measure  Help needed turning from your back to your side while in a flat bed without using bedrails?: None Help needed moving from lying on your back to sitting on the side of a flat bed without using bedrails?: A Little Help needed moving to and from a bed to a chair (including a wheelchair)?: A Little Help needed standing up from a chair using your arms (e.g., wheelchair or bedside chair)?: None Help needed to walk in hospital room?: A Little Help needed climbing 3-5 steps with a railing? : A Little 6 Click Score: 20    End of Session Equipment Utilized During Treatment: Gait belt Activity Tolerance: Patient tolerated treatment well Patient left: with call bell/phone within reach;with  family/visitor present;in chair;with chair alarm set Nurse Communication: Mobility status PT Visit Diagnosis: Hemiplegia and hemiparesis;Other abnormalities of gait and mobility (R26.89) Hemiplegia - Right/Left: Left Hemiplegia - dominant/non-dominant: Non-dominant Hemiplegia - caused by: Cerebral infarction     Time: 0900-0927 PT Time Calculation (min) (ACUTE ONLY): 27 min  Charges:  $Gait Training: 23-37 mins                     Thompson Mckim E, PT Acute Rehabilitation Services Pager [phone]  Office [phone]  Alexus Galka D Dezzie Badilla 12/27/2019, 11:10 AM

## 2019-12-27 NOTE — Progress Notes (Signed)
Occupational Therapy Treatment Patient Details Name: Evan Stanley MRN: [id number] DOB: [birth year] Today's Date: 12/27/2019    History of present illness Evan Stanley is a 79 y.o. 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 MRI showed several tiny acute cortical/subcortical infarcts within the posterior right frontal lobe and right parietal lobe.   OT comments  Pt progressing toward established OT goals, updated d/c recommendation to home with [place]. Pt currently requires supervision to minguard assistance with functional mobility at RW level during completion of grooming and toileting. Pt demonstrates improvement with cognition, demonstrated WNL for processing speed and problem solving. Pt will continue to benefit from skilled OT services to maximize safety and independence with ADL/IADL and functional mobility. Will continue to follow acutely and progress as tolerated.    Follow Up Recommendations  Home health OT    Equipment Recommendations  None recommended by OT    Recommendations for Other Services      Precautions / Restrictions Precautions Precautions: Fall Restrictions Weight Bearing Restrictions: No       Mobility Bed Mobility               General bed mobility comments: sitting in recliner upon arrival  Transfers Overall transfer level: Needs assistance Equipment used: Rolling walker (2 wheeled) Transfers: Sit to/from Stand Sit to Stand: Supervision         General transfer comment: supervision for safety    Balance Overall balance assessment: Needs assistance Sitting-balance support: Single extremity supported;Feet supported Sitting balance-Leahy Scale: Good     Standing  balance support: No upper extremity supported;During functional activity Standing balance-Leahy Scale: Fair Standing balance comment: reliant on RW for external support during ambulation, able to static stand without UE support                           ADL either performed or assessed with clinical judgement   ADL Overall ADL's : Needs assistance/impaired Eating/Feeding: Set up;Sitting   Grooming: Supervision/safety;Standing Grooming Details (indicate cue type and reason): completed hand washing at sink level                 Toilet Transfer: RW;Ambulation;Min guard Toilet Transfer Details (indicate cue type and reason): supervision, pt urinated while standing Toileting- Clothing Manipulation and Hygiene: Supervision/safety       Functional mobility during ADLs: Supervision/safety;Rolling walker;Min guard General ADL Comments: demonstrating good safety awareness throughout, minguard for higher level balance during activities     Vision       Perception     Praxis      Cognition Arousal/Alertness: Awake/alert Behavior During Therapy: WFL for tasks assessed/performed Overall Cognitive Status: Within Functional Limits for tasks assessed                                 General Comments: pt demonstrated improvement with cognitive processing speed, short term memory and problem solving this session;pt demonstrating good carry over of safety from previous sessions. pt pleasant throughout today's session.         Exercises     Shoulder Instructions       General Comments wife present  throughout, reports she feels comfortable with pt discharging home    Pertinent Vitals/ Pain       Pain Assessment: No/denies pain Pain Intervention(s): Monitored during session  Home Living                                          Prior Functioning/Environment              Frequency  Min 2X/week        Progress Toward  Goals  OT Goals(current goals can now be found in the care plan section)  Progress towards OT goals: Progressing toward goals  Acute Rehab OT Goals Patient Stated Goal: to go home today OT Goal Formulation: With patient Time For Goal Achievement: 01/08/20 Potential to Achieve Goals: Good ADL Goals Pt Will Perform Grooming: with modified independence;standing Pt Will Perform Lower Body Dressing: with modified independence;sit to/from stand Pt Will Transfer to Toilet: with modified independence;ambulating Additional ADL Goal #1: Pt will demonstrate anticipatory awareness for safe engagement in ADL and functional mobility.  Plan Discharge plan needs to be updated    Co-evaluation                 AM-PAC OT "6 Clicks" Daily Activity     Outcome Measure   Help from another person eating meals?: A Little Help from another person taking care of personal grooming?: A Little Help from another person toileting, which includes using toliet, bedpan, or urinal?: A Little Help from another person bathing (including washing, rinsing, drying)?: A Little Help from another person to put on and taking off regular upper body clothing?: A Little Help from another person to put on and taking off regular lower body clothing?: A Little 6 Click Score: 18    End of Session Equipment Utilized During Treatment: Gait belt;Rolling walker  OT Visit Diagnosis: Unsteadiness on feet (R26.81);Other abnormalities of gait and mobility (R26.89);Muscle weakness (generalized) (M62.81);History of falling (Z91.81);Other symptoms and signs involving cognitive function;Pain Pain - Right/Left: Left Pain - part of body: Knee   Activity Tolerance Patient tolerated treatment well   Patient Left in chair;with call bell/phone within reach;with family/visitor present   Nurse Communication Mobility status        Time: 0388-8280 OT Time Calculation (min): 15 min  Charges: OT General Charges $OT Visit: 1  Visit OT Treatments $Self Care/Home Management : 8-22 mins  Helene Kelp OTR/L Acute Rehabilitation Services Office: [place] 12/27/2019, 10:57 AM

## 2019-12-27 NOTE — Discharge Summary (Signed)
Physician Discharge Summary  Evan Stanley VOH:[id number] DOB: 1940-12-20 DOA: 12/24/2019  PCP: [name], DO  Admit date: 12/24/2019 Discharge date: 12/27/2019  Admitted From: Home  Disposition:  Home with [facility]   Recommendations for Outpatient Follow-up:  1. Follow up with PCP in 1-2 weeks 2. Follow up with Neurology in 4-6 weeks 3. Please have patient follow-up with his primary cardiologist.  If no confirmation of previous A. fib can be found, loop recorder would be reasonable   Home Health: PT/OT due to balance issues ongoing  Equipment/Devices: Walker  Discharge Condition: Fair  CODE STATUS: FULL Diet recommendation: Cardiac  Brief/Interim Summary: [name] is a 79 y.o. M with hx bioprosthetic aortic valve placed in 2010, hyperlipidemia, coronary artery disease, left ventricular systolic dysfunction, paroxysmal A. fib , right cerebellar CVA in 2017 with residual left eye blindness, total occlusion of right ICA, CKD stage IIIb, diabetes type 2 who presents with recurrent fall.  In the ER, MRI showed multiple acute and subacute cortical infarcts.      PRINCIPAL HOSPITAL DIAGNOSIS: Acute embolic stroke      Discharge Diagnoses:  Acute embolic stroke -Non-invasive angiography showed multiple chronic intracranial stenoses and occlusions -Echocardiogram showed no cardiogenic source of embolism -Carotid imaging chronically occluded left ICA, normal right -Lipids ordered: discharged on home Lipitor -Aspirin ordered at admission   -Atrial fibrillation: Documented in the chart, but no EKG is present to confirm, no previous awareness by the patient or his wife, no previous documentation and cardiology notes.  However given the embolic pattern on imaging, the previous documentations of AF, suspect that he has paroxysmal atrial fibrillation.  The balance of benefit and risk appear to favor long-term anticoagulation for stroke prevention with Eliquis -tPA not given because  outside the stroke window -Dysphagia screen ordered in ER -PT eval ordered: recommended home health -Smoking cessation: Not pertinent   Paroxysmal A. Fib: This is not a new diagnosis, although the patient was not previously on anticoagulation.  Here in the hospital he maintained in normal sinus rhythm.  He was not on any anticoagulation.CHADSVASC=7.  We consulted cardiology for recommendation on starting on anticoagulation.  Cardiology recommended to start on Eliquis.    History of peripheral artery disease: Has significant peripheral artery disease with history of right carotid enterectomy and chronic left ICA and severe right ECA stenosis.    Left knee pain:  Most likely history with fall.  X-ray did not show any fracture dislocation but showed chronic degenerative disease.  CKD stage IIIb:  Currently kidney function at baseline.  Baseline creatinine around 1.9.  Diabetes type 2:  Hemoglobin A1c of 5.8 is in February this year.  Increased to 7.4 now.    Is adequate control given his age  Hypertension  History of aortic valve repair/coronary artery  disease/stroke       Discharge Instructions  Discharge Instructions    Ambulatory referral to Neurology   Complete by: As directed    Follow up in stroke clinic at [facility] with Frann Rider, NP in about 4 weeks. If not available, consider Dr. [name], Dr. Bess Harvest, or Dr. Sarina Ill.   Diet - low sodium heart healthy   Complete by: As directed    Discharge instructions   Complete by: As directed    From Dr. Loleta Books: You were admitted for a stroke While you were here, we determined that this stroke was likely the result of atrial fibrillation Atrial fibrillation is an abnormal heart  rhythm where the top chambers of the heart do not flush the blood out with every beat.   This allows blood to pool in the nooks and crannies.  If that pooled blood clots, you can have a stroke For this  reason, we usually have folks with Atrial fibrillation take a blood thinner  Call Dr. Lemar Lofty for a follow up appointment in 1 week I will send her this discharge summary  Go see Dr. Leonie Man at [facility] in 4-6 weeks (call the number below for this appontment time if you haven't confirmed the appointment before discharge)  Resume your home diabetes medicines as listed below.   If this list doesn't match your home medicines, call Dr. Lemar Lofty to clarify  Start taking the blood thinner apixaban/Eliquis 5 mg twice daily STOP taking aspirin (this is not necessary and could cause bleeding with Eliquis)   Increase activity slowly   Complete by: As directed      Allergies as of 12/27/2019   No Known Allergies     Medication List    STOP taking these medications   amLODipine 10 MG tablet Commonly known as: NORVASC   aspirin EC 81 MG tablet   ibuprofen 200 MG tablet Commonly known as: ADVIL     TAKE these medications   apixaban 5 MG Tabs tablet Commonly known as: ELIQUIS Take 1 tablet (5 mg total) by mouth 2 (two) times daily.   atorvastatin 40 MG tablet Commonly known as: LIPITOR TAKE 1 TABLET BY MOUTH  DAILY What changed: when to take this   atropine 1 % ophthalmic solution Place 1 drop into the left eye 3 (three) times daily as needed (eye pain).   cetirizine 10 MG tablet Commonly known as: ZYRTEC Take 10 mg by mouth daily.   hydrochlorothiazide 25 MG tablet Commonly known as: HYDRODIURIL Take 1 tablet (25 mg total) by mouth daily.   insulin lispro 100 UNIT/ML injection Commonly known as: HUMALOG Inject 6 Units into the skin daily before supper.   latanoprost 0.005 % ophthalmic solution Commonly known as: XALATAN Place 1 drop into the left eye at bedtime.   lisinopril 40 MG tablet Commonly known as: ZESTRIL Take 1 tablet (40 mg total) by mouth daily.   metoprolol succinate 100 MG 24 hr tablet Commonly known as: TOPROL-XL Take with or immediately  following a meal. What changed:   how much to take  how to take this  when to take this   OneTouch Ultra test strip Generic drug: glucose blood USE TO CHECK BLOOD SUGAR  TWICE DAILY   onetouch ultrasoft lancets Test BS BID Dx E11.9   [name] FlexTouch 200 UNIT/ML FlexTouch Pen Generic drug: insulin degludec Inject 50 Units into the skin daily.   Trulicity 1.5 QJ/3.3LK Sopn Generic drug: Dulaglutide Inject 1.5 mg into the skin once a week. What changed: when to take this   vitamin C 500 MG tablet Commonly known as: ASCORBIC ACID Take 500 mg by mouth daily.   vitamin E 1000 UNIT capsule Generic drug: vitamin E Take 1,000 Units by mouth daily.            Durable Medical Equipment  (From admission, onward)         Start     Ordered   12/27/19 1157  DME Walker  Once    Question Answer Comment  Walker: With 5 Inch Wheels   Patient needs a walker to treat with the following condition Stroke (cerebrum) ([place])  12/27/19 1200         Follow-up Information    [facility] Follow up in 4 week(s).   Specialty: Neurology Why: stroke clinic. office will call in appt date and time Contact information: [address] [place] Loraine Woodson, [place], DO. Schedule an appointment as soon as possible for a visit in 1 week(s).   Specialty: Family Medicine Contact information: [place] [address] [phone]          No Known Allergies  Consultations:  Neurology   Procedures/Studies: CT HEAD WO CONTRAST  Result Date: 12/24/2019 CLINICAL DATA:  Unsteady on feet, off balance since Wednesday, intermittent dizziness, fell on Thursday and Friday, possible stroke; history stroke, atrial fibrillation, hypertension, CHF, diabetes mellitus, former smoker EXAM: CT HEAD WITHOUT CONTRAST TECHNIQUE: Contiguous axial images were obtained from the base of the skull through the vertex  without intravenous contrast. Sagittal and coronal MPR images reconstructed from axial data set. COMPARISON:  12/21/2019 FINDINGS: Brain: Generalized atrophy. Normal ventricular morphology. No midline shift or mass effect. Small vessel chronic ischemic changes of deep cerebral white matter. No intracranial hemorrhage, mass lesion, evidence of acute infarction, or extra-axial fluid collection. Vascular: Atherosclerotic calcification of internal carotid and vertebral arteries at skull base. No hyperdense vessels. Skull: Intact Sinuses/Orbits: Clear Other: N/A IMPRESSION: Atrophy with small vessel chronic ischemic changes of deep cerebral white matter. No acute intracranial abnormalities. Electronically Signed   By: Lavonia Dana M.D.   On: 12/24/2019 14:15   CT Head Wo Contrast  Result Date: 12/21/2019 CLINICAL DATA:  79 year old male with head trauma. Dizziness. EXAM: CT HEAD WITHOUT CONTRAST TECHNIQUE: Contiguous axial images were obtained from the base of the skull through the vertex without intravenous contrast. COMPARISON:  Head CT dated 05/13/2019. FINDINGS: Brain: Moderate age-related atrophy and chronic microvascular ischemic changes. There is no acute intracranial hemorrhage. No mass effect or midline shift. No extra-axial fluid collection. Vascular: No hyperdense vessel or unexpected calcification. Skull: Normal. Negative for fracture or focal lesion. Sinuses/Orbits: No acute finding. Other: None IMPRESSION: 1. No acute intracranial pathology. 2. Moderate age-related atrophy and chronic microvascular ischemic changes. Electronically Signed   By: Anner Crete M.D.   On: 12/21/2019 19:43   MR ANGIO HEAD WO CONTRAST  Result Date: 12/25/2019 CLINICAL DATA:  Stroke follow-up EXAM: MRA HEAD WITHOUT CONTRAST TECHNIQUE: Angiographic images of the Circle of Willis were obtained using MRA technique without intravenous contrast. COMPARISON:  MRA of the neck 05/15/2019 FINDINGS: Chronically occluded left ICA in  the neck with faint flow in the supraclinoid ICA and MCA branches. The left A1 segment is hypoplastic but patent. No downstream branch occlusion is seen. Robust flow in the right carotid siphon and downstream branches. Chronic right V4 segment occlusion. Robust flow in the left vertebral artery. The dominant bilateral cerebellar arteries are patent and enhancing. There is an apparent filling defect in the central mid to distal basilar artery which is considered artifactual given preceding neck MRA with contrast appearance which shows complete filling of this vessel. Moderate proximal basilar and right P2 segment narrowing. IMPRESSION: 1. No emergent finding. 2. Chronic cervical left ICA and right V4 segment occlusion. 3. Moderate proximal basilar and right P2 segment stenosis. Electronically Signed   By: Monte Fantasia M.D.   On: 12/25/2019 08:40   MR ANGIO NECK W WO CONTRAST  Result Date: 12/24/2019 CLINICAL DATA:  Follow-up examination for acute stroke. EXAM:  MRA NECK WITHOUT AND WITH CONTRAST TECHNIQUE: Multiplanar and multiecho pulse sequences of the neck were obtained without and with intravenous contrast. Angiographic images of the neck were obtained using MRA technique without and with intravenous contrast. CONTRAST:  7.73m GADAVIST GADOBUTROL 1 MMOL/ML IV SOLN COMPARISON:  Prior brain MRI from earlier the same day as well as previous MRI from 05/15/2019. FINDINGS: AORTIC ARCH: Examination moderately degraded by motion artifact. Visualized aortic arch of normal caliber with normal 3 vessel morphology. No flow-limiting stenosis seen about the origin of the great vessels. RIGHT CAROTID SYSTEM: Right CCA patent from its origin to the bifurcation without flow-limiting stenosis. No significant atheromatous narrowing about the right bifurcation. Right ICA tortuous and widely patent distally to the circle-of-Willis without occlusion or other abnormality. Chronic severe stenosis at the origin of the right ECA  partially visualize, similar to previous. LEFT CAROTID SYSTEM: Left CCA patent from its origin to the bifurcation without stenosis. Chronic left ICA occlusion at its origin, which remains occluded to the level of the siphon. Moderate to severe stenosis at the origin of the left ECA noted as well. VERTEBRAL ARTERIES: Both vertebral arteries arise from the subclavian arteries. No visible proximal subclavian stenosis. Moderate stenosis at the distal left subclavian artery noted (series 1037, image 9). Vertebral arteries widely patent within the neck, with the left vertebral slightly dominant. Chronic conclusion of the intracranial right V4 segment. IMPRESSION: 1. No acute vascular abnormality within the neck. 2. Chronic left ICA occlusion. 3. Chronic distal right V4 segment occlusion. 4. Chronic severe stenosis at the origin of the right ECA, with moderate to severe stenosis at the origin of the left ECA. 5. Moderate distal left subclavian artery stenosis. Electronically Signed   By: BJeannine BogaM.D.   On: 12/24/2019 23:22   MR BRAIN WO CONTRAST  Result Date: 12/24/2019 CLINICAL DATA:  Unsteady gait, 3 fall; ataxia, stroke suspected. Additional provided: Intermittent dizziness, left knee pain, several recent falls. EXAM: MRI HEAD WITHOUT CONTRAST TECHNIQUE: Multiplanar, multiecho pulse sequences of the brain and surrounding structures were obtained without intravenous contrast. COMPARISON:  Prior head CT examinations 12/24/2019 and earlier, brain MRI 05/15/2019. CT angiogram neck 11/02/2014. FINDINGS: Brain: There are few tiny cortical/subcortical foci of restricted diffusion within the posterior right frontal lobe and right parietal lobe consistent with acute infarcts (for instance as seen on series 3, images 41, 38, 37). Redemonstrated advanced generalized parenchymal atrophy. As before, there are multiple chronic lacunar infarcts within the bilateral cerebral white matter. Otherwise mild chronic small  vessel ischemic disease. No evidence of intracranial mass. No chronic intracranial blood products. No extra-axial fluid collection. No midline shift. Vascular: Signal abnormality within the V4 right vertebral artery consistent with known, chronic occlusion. Signal abnormality within the visualized distal cervical left ICA and left ICA siphon consistent with known chronic occlusion. As before there is reconstitution of the expected flow void within the paraclinoid left ICA. Skull and upper cervical spine: No focal marrow lesion. Sinuses/Orbits: Visualized orbits show no acute finding. Mild ethmoid sinus mucosal thickening. No significant mastoid effusion. IMPRESSION: Several tiny acute cortical/subcortical infarcts within the posterior right frontal lobe and right parietal lobe. As before, there are multiple chronic lacunar infarcts within the bilateral cerebral white matter. Otherwise mild chronic small vessel ischemic disease. Stable, advanced generalized parenchymal atrophy. Signal abnormality consistent with known chronic occlusion of the V4 right vertebral artery, distal cervical left ICA and left ICA siphon. As before, there is reconstitution of the expected flow void within the  paraclinoid left ICA. Mild ethmoid sinus mucosal thickening. Electronically Signed   By: Kellie Simmering DO   On: 12/24/2019 18:34   DG Knee Complete 4 Views Left  Result Date: 12/24/2019 CLINICAL DATA:  Fall.  Swelling. EXAM: LEFT KNEE - COMPLETE 4+ VIEW COMPARISON:  None. FINDINGS: There are moderate degenerative changes involving the LATERAL, MEDIAL, and patellofemoral compartments. Small joint effusion is present. No acute fracture. There is atherosclerotic calcification of the popliteal artery. IMPRESSION: Degenerative changes and small joint effusion. Electronically Signed   By: Nolon Nations M.D.   On: 12/24/2019 17:07   ECHOCARDIOGRAM COMPLETE  Result Date: 12/25/2019    ECHOCARDIOGRAM REPORT   Patient Name:   Evan Stanley  [facility] Date of Exam: 12/25/2019 Medical Rec #:  [id number]        Height:       65.0 in Accession #:    [id number]       Weight:       165.6 lb Date of Birth:  May 18, 1941         BSA:          1.825 m Patient Age:    65 years         BP:           108/68 mmHg Patient Gender: M                HR:           65 bpm. Exam Location:  Inpatient Procedure: 2D Echo, Intracardiac Opacification Agent, Cardiac Doppler and Color            Doppler Indications:    Stroke  History:        Patient has prior history of Echocardiogram examinations, most                 recent 05/15/2019. Risk Factors:Diabetes and Dyslipidemia. CKD.                 S/p aortic valve replacement.  Sonographer:    [name] Lefort RDCS (AE) Referring Phys: [id number] [place] T TU  Sonographer Comments: Technically difficult study due to poor echo windows, suboptimal parasternal window, suboptimal apical window and no subcostal window. IMPRESSIONS  1. Left ventricular ejection fraction, by estimation, is 65 to 70%. The left ventricle has normal function. The left ventricle has no regional wall motion abnormalities. There is moderate left ventricular hypertrophy. Left ventricular diastolic parameters are consistent with Grade I diastolic dysfunction (impaired relaxation). Elevated left ventricular end-diastolic pressure.  2. Right ventricular systolic function is low normal. The right ventricular size is normal.  3. The mitral valve is abnormal. Trivial mitral valve regurgitation.  4. The aortic valve has been repaired/replaced with a bioprosthetic valve. Aortic valve regurgitation is not visualized. Aortic valve area, by VTI measures 2.69 cm. Aortic valve mean gradient measures 8.0 mmHg. Peak gradient is 13.5 mmHg. Aortic valve Vmax measures 1.84 m/s. Comparison(s): 05/15/2019: LVEF 60-65%, aortic valve mean gradient 13 mmHg, peak gradient 24 mmHg. FINDINGS  Left Ventricle: Left ventricular ejection fraction, by estimation, is 65 to 70%. The left ventricle has normal  function. The left ventricle has no regional wall motion abnormalities. Definity contrast agent was given IV to delineate the left ventricular  endocardial borders. The left ventricular internal cavity size was normal in size. There is moderate left ventricular hypertrophy. Left ventricular diastolic parameters are consistent with Grade I diastolic dysfunction (impaired relaxation). Elevated left ventricular end-diastolic pressure. Right Ventricle: The right ventricular size  is normal. No increase in right ventricular wall thickness. Right ventricular systolic function is low normal. Left Atrium: Left atrial size was normal in size. Right Atrium: Right atrial size was normal in size. Pericardium: There is no evidence of pericardial effusion. Mitral Valve: The mitral valve is abnormal. There is mild thickening of the mitral valve leaflet(s). Mild to moderate mitral annular calcification. Trivial mitral valve regurgitation. MV peak gradient, 4.2 mmHg. The mean mitral valve gradient is 2.0 mmHg. Tricuspid Valve: The tricuspid valve is not well visualized. Tricuspid valve regurgitation is not demonstrated. Aortic Valve: The aortic valve has been repaired/replaced. Aortic valve regurgitation is not visualized. Aortic valve mean gradient measures 8.0 mmHg. There is a unknown bioprosthetic valve present in the aortic position. Pulmonic Valve: The pulmonic valve was not well visualized. Pulmonic valve regurgitation is not visualized. Aorta: The aortic root and ascending aorta are structurally normal, with no evidence of dilitation. Venous: The inferior vena cava was not well visualized. IAS/Shunts: No atrial level shunt detected by color flow Doppler.  LEFT VENTRICLE PLAX 2D LVIDd:         4.00 cm  Diastology LVIDs:         2.39 cm  LV e' lateral:   6.75 cm/s LV PW:         1.40 cm  LV E/e' lateral: 15.7 LV IVS:        1.25 cm  LV e' medial:    5.11 cm/s LVOT diam:     2.40 cm  LV E/e' medial:  20.7 LV SV:         84 LV SV  Index:   46 LVOT Area:     4.52 cm  RIGHT VENTRICLE RV Basal diam:  2.98 cm RV S prime:     8.87 cm/s LEFT ATRIUM             Index       RIGHT ATRIUM           Index LA diam:        2.60 cm 1.42 cm/m  RA Area:     14.50 cm LA Vol (A2C):   50.2 ml 27.50 ml/m RA Volume:   33.40 ml  18.30 ml/m LA Vol (A4C):   55.8 ml 30.57 ml/m LA Biplane Vol: 55.2 ml 30.24 ml/m  AORTIC VALVE AV Area (Vmax):    2.32 cm AV Area (Vmean):   2.37 cm AV Area (VTI):     2.69 cm AV Vmax:           184.00 cm/s AV Vmean:          121.000 cm/s AV VTI:            0.313 m AV Peak Grad:      13.5 mmHg AV Mean Grad:      8.0 mmHg LVOT Vmax:         94.20 cm/s LVOT Vmean:        63.500 cm/s LVOT VTI:          0.186 m LVOT/AV VTI ratio: 0.59  AORTA Ao Root diam: 2.30 cm MITRAL VALVE MV Area (PHT): 3.12 cm     SHUNTS MV Peak grad:  4.2 mmHg     Systemic VTI:  0.19 m MV Mean grad:  2.0 mmHg     Systemic Diam: 2.40 cm MV Vmax:       1.03 m/s MV Vmean:      55.9 cm/s MV Decel Time: 243 msec  MV E velocity: 106.00 cm/s MV A velocity: 96.80 cm/s MV E/A ratio:  1.10 Lyman Bishop MD Electronically signed by Lyman Bishop MD Signature Date/Time: 12/25/2019/4:45:22 PM    Final    OCT, Retina - OU - Both Eyes  Result Date: 11/29/2019 Right Eye Quality was good. Scan locations included subfoveal. Central Foveal Thickness: 274. Findings include normal observations. Left Eye Quality was good. Scan locations included subfoveal. Central Foveal Thickness: 333. Progression has been stable. Findings include abnormal foveal contour. Notes OD with posterior vitreous detachment Foveal atrophy OS, with vitreal macular traction temporally, no foveal distortion observed  Color Fundus Photography Optos - OU - Both Eyes  Result Date: 11/29/2019 Right Eye Progression has been stable. Disc findings include normal observations. Macula : normal observations. Periphery : normal observations. Left Eye Progression has been stable. Disc findings include increased cup  to disc ratio, pallor. Vessels : attenuated. Notes OS, good PRP room temporally.  Clear media.  Old central retinal artery occlusion OS with optic pallor      Subjective: Patient feels well.  History is left is coming back.  No confusion, fever, vomiting.  Discharge Exam: Vitals:   12/26/19 2323 12/27/19 0734  BP: 109/64 (!) 141/67  Pulse: 67 66  Resp:  18  Temp: 98.4 F (36.9 C) (!) 97.5 F (36.4 C)  SpO2: 95% 96%   Vitals:   12/26/19 0731 12/26/19 1631 12/26/19 2323 12/27/19 0734  BP: 126/61 107/72 109/64 (!) 141/67  Pulse: 68 67 67 66  Resp: 16 17  18   Temp: 97.9 F (36.6 C)  98.4 F (36.9 C) (!) 97.5 F (36.4 C)  TempSrc:   Oral Oral  SpO2: 98%  95% 96%  Weight:      Height:        General: Pt is alert, awake, not in acute distress Cardiovascular: RRR, nl S1-S2, no murmurs appreciated.   No LE edema.   Respiratory: Normal respiratory rate and rhythm.  CTAB without rales or wheezes. Abdominal: Abdomen soft and non-tender.  No distension or HSM.   Neuro/Psych: Strength mildly diminished on the left side.  Judgment and insight appear normal.   The results of significant diagnostics from this hospitalization (including imaging, microbiology, ancillary and laboratory) are listed below for reference.     Microbiology: Recent Results (from the past 240 hour(s))  SARS Coronavirus 2 by RT PCR (hospital order, performed in [facility] hospital lab) Nasopharyngeal Nasopharyngeal Swab     Status: None   Collection Time: 12/24/19  6:58 PM   Specimen: Nasopharyngeal Swab  Result Value Ref Range Status   SARS Coronavirus 2 NEGATIVE NEGATIVE Final    Comment: (NOTE) SARS-CoV-2 target nucleic acids are NOT DETECTED. The SARS-CoV-2 RNA is generally detectable in upper and lower respiratory specimens during the acute phase of infection. The lowest concentration of SARS-CoV-2 viral copies this assay can detect is 250 copies / mL. A negative result does not preclude SARS-CoV-2  infection and should not be used as the sole basis for treatment or other patient management decisions.  A negative result may occur with improper specimen collection / handling, submission of specimen other than nasopharyngeal swab, presence of viral mutation(s) within the areas targeted by this assay, and inadequate number of viral copies (<250 copies / mL). A negative result must be combined with clinical observations, patient history, and epidemiological information. Fact Sheet for Patients:   StrictlyIdeas.no Fact Sheet for Healthcare Providers: BankingDealers.co.za This test is not yet approved or cleared  by the [place] and has been authorized for detection and/or diagnosis of SARS-CoV-2 by FDA under an Emergency Use Authorization (EUA).  This EUA will remain in effect (meaning this test can be used) for the duration of the COVID-19 declaration under Section 564(b)(1) of the Act, 21 U.S.C. section 360bbb-3(b)(1), unless the authorization is terminated or revoked sooner. Performed at [facility], [place] [address], [place], [place] [address]      Labs: BNP (last 3 results) No results for input(s): BNP in the last 8760 hours. Basic Metabolic Panel: Recent Labs  Lab 12/21/19 1944 12/24/19 1350 12/24/19 1424 12/26/19 0411  NA 140 134* 137 139  K 3.9 3.9 3.9 3.9  CL 103 101 98 99  CO2 27 25  --  29  GLUCOSE 115* 179* 177* 143*  BUN 39* 33* 34* 43*  CREATININE 2.10* 1.85* 1.90* 2.05*  CALCIUM 8.9 9.2  --  9.2   Liver Function Tests: Recent Labs  Lab 12/21/19 1944 12/24/19 1350  AST 18 18  ALT 21 18  ALKPHOS 91 80  BILITOT 0.3 1.0  PROT 6.3* 6.7  ALBUMIN 3.9 3.6   No results for input(s): LIPASE, AMYLASE in the last 168 hours. No results for input(s): AMMONIA in the last 168 hours. CBC: Recent Labs  Lab 12/21/19 1944 12/24/19 1350 12/24/19 1424  WBC 9.4 9.5  --   NEUTROABS 6.0 7.0  --   HGB  14.8 15.5 16.3  HCT 47.0 48.5 48.0  MCV 85.3 82.8  --   PLT 176 207  --    Cardiac Enzymes: No results for input(s): CKTOTAL, CKMB, CKMBINDEX, TROPONINI in the last 168 hours. BNP: Invalid input(s): POCBNP CBG: Recent Labs  Lab 12/25/19 2113 12/26/19 0821 12/26/19 1756 12/27/19 0732 12/27/19 1127  GLUCAP 100* 138* 141* 151* 192*   D-Dimer No results for input(s): DDIMER in the last 72 hours. Hgb A1c Recent Labs    12/25/19 0257  HGBA1C 7.4*   Lipid Profile Recent Labs    12/25/19 0257  CHOL 89  HDL 30*  LDLCALC 47  TRIG 60  CHOLHDL 3.0   Thyroid function studies No results for input(s): TSH, T4TOTAL, T3FREE, THYROIDAB in the last 72 hours.  Invalid input(s): FREET3 Anemia work up No results for input(s): VITAMINB12, FOLATE, FERRITIN, TIBC, IRON, RETICCTPCT in the last 72 hours. Urinalysis    Component Value Date/Time   COLORURINE YELLOW 12/24/2019 1622   APPEARANCEUR CLEAR 12/24/2019 1622   LABSPEC 1.013 12/24/2019 1622   PHURINE 5.0 12/24/2019 1622   GLUCOSEU NEGATIVE 12/24/2019 1622   HGBUR NEGATIVE 12/24/2019 1622   BILIRUBINUR NEGATIVE 12/24/2019 1622   KETONESUR NEGATIVE 12/24/2019 1622   PROTEINUR NEGATIVE 12/24/2019 1622   UROBILINOGEN >8.0 (H) 10/30/2008 2328   NITRITE NEGATIVE 12/24/2019 1622   LEUKOCYTESUR NEGATIVE 12/24/2019 1622   Sepsis Labs Invalid input(s): PROCALCITONIN,  WBC,  LACTICIDVEN Microbiology Recent Results (from the past 240 hour(s))  SARS Coronavirus 2 by RT PCR (hospital order, performed in [place] hospital lab) Nasopharyngeal Nasopharyngeal Swab     Status: None   Collection Time: 12/24/19  6:58 PM   Specimen: Nasopharyngeal Swab  Result Value Ref Range Status   SARS Coronavirus 2 NEGATIVE NEGATIVE Final    Comment: (NOTE) SARS-CoV-2 target nucleic acids are NOT DETECTED. The SARS-CoV-2 RNA is generally detectable in upper and lower respiratory specimens during the acute phase of infection. The  lowest concentration of SARS-CoV-2 viral copies this assay can detect is 250 copies /  mL. A negative result does not preclude SARS-CoV-2 infection and should not be used as the sole basis for treatment or other patient management decisions.  A negative result may occur with improper specimen collection / handling, submission of specimen other than nasopharyngeal swab, presence of viral mutation(s) within the areas targeted by this assay, and inadequate number of viral copies (<250 copies / mL). A negative result must be combined with clinical observations, patient history, and epidemiological information. Fact Sheet for Patients:   StrictlyIdeas.no Fact Sheet for Healthcare Providers: BankingDealers.co.za This test is not yet approved or cleared  by the [place] FDA and has been authorized for detection and/or diagnosis of SARS-CoV-2 by FDA under an Emergency Use Authorization (EUA).  This EUA will remain in effect (meaning this test can be used) for the duration of the COVID-19 declaration under Section 564(b)(1) of the Act, 21 U.S.C. section 360bbb-3(b)(1), unless the authorization is terminated or revoked sooner. Performed at [facility], [place] [address] [place], [place] [address]      Time coordinating discharge: 25 minutes The [place] controlled substances registry was reviewed for this patient       SIGNED:   Edwin Dada, MD  [facility] 12/27/2019, 12:00 PM

## 2019-12-27 NOTE — Progress Notes (Signed)
Inpatient Rehab Admissions Coordinator:   Notified by PT that pt is progressing very well, currently supervision level and able to carry over safety cues with RW.  They are updating recs to [place].  Will sign off for CIR at this time.   Shann Medal, PT, DPT Admissions Coordinator [phone] 12/27/19  9:48 AM

## 2019-12-27 NOTE — Telephone Encounter (Signed)
Hospital follow up made

## 2019-12-28 ENCOUNTER — Other Ambulatory Visit: Payer: Self-pay | Admitting: *Deleted

## 2019-12-28 MED ORDER — ONETOUCH DELICA PLUS LANCET30G MISC: 3 refills | Status: DC

## 2019-12-28 MED ORDER — ONETOUCH ULTRA VI STRP: ORAL_STRIP | 3 refills | Status: DC

## 2019-12-31 ENCOUNTER — Other Ambulatory Visit: Payer: Self-pay | Admitting: Family Medicine

## 2020-01-01 DIAGNOSIS — I1310 Hypertensive heart and chronic kidney disease without heart failure, with stage 1 through stage 4 chronic kidney disease, or unspecified chronic kidney disease: Secondary | ICD-10-CM | POA: Diagnosis not present

## 2020-01-01 DIAGNOSIS — I2510 Atherosclerotic heart disease of native coronary artery without angina pectoris: Secondary | ICD-10-CM | POA: Diagnosis not present

## 2020-01-01 DIAGNOSIS — I69398 Other sequelae of cerebral infarction: Secondary | ICD-10-CM | POA: Diagnosis not present

## 2020-01-01 DIAGNOSIS — I69354 Hemiplegia and hemiparesis following cerebral infarction affecting left non-dominant side: Secondary | ICD-10-CM | POA: Diagnosis not present

## 2020-01-01 DIAGNOSIS — H5462 Unqualified visual loss, left eye, normal vision right eye: Secondary | ICD-10-CM | POA: Diagnosis not present

## 2020-01-04 ENCOUNTER — Other Ambulatory Visit: Payer: Self-pay

## 2020-01-04 ENCOUNTER — Encounter: Payer: Self-pay | Admitting: Family Medicine

## 2020-01-04 ENCOUNTER — Ambulatory Visit (INDEPENDENT_AMBULATORY_CARE_PROVIDER_SITE_OTHER): Payer: Medicare Other | Admitting: Family Medicine

## 2020-01-04 VITALS — BP 108/58 | HR 72 | Temp 98.2°F | Ht 65.0 in | Wt 162.0 lb

## 2020-01-04 DIAGNOSIS — I480 Paroxysmal atrial fibrillation: Secondary | ICD-10-CM

## 2020-01-04 DIAGNOSIS — Z8673 Personal history of transient ischemic attack (TIA), and cerebral infarction without residual deficits: Secondary | ICD-10-CM

## 2020-01-04 LAB — CBC WITH DIFFERENTIAL/PLATELET
Basophils Absolute: 0.1 10*3/uL (ref 0.0–0.2)
Basos: 1 %
EOS (ABSOLUTE): 0.2 10*3/uL (ref 0.0–0.4)
Eos: 2 %
Hematocrit: 46.1 % (ref 37.5–51.0)
Hemoglobin: 14.9 g/dL (ref 13.0–17.7)
Immature Grans (Abs): 0 10*3/uL (ref 0.0–0.1)
Immature Granulocytes: 0 %
Lymphocytes Absolute: 1.4 10*3/uL (ref 0.7–3.1)
Lymphs: 14 %
MCH: 26.8 pg (ref 26.6–33.0)
MCHC: 32.3 g/dL (ref 31.5–35.7)
MCV: 83 fL (ref 79–97)
Monocytes Absolute: 0.8 10*3/uL (ref 0.1–0.9)
Monocytes: 9 %
Neutrophils Absolute: 6.9 10*3/uL (ref 1.4–7.0)
Neutrophils: 74 %
Platelets: 230 10*3/uL (ref 150–450)
RBC: 5.56 x10E6/uL (ref 4.14–5.80)
RDW: 13.2 % (ref 11.6–15.4)
WBC: 9.4 10*3/uL (ref 3.4–10.8)

## 2020-01-04 LAB — CMP14+EGFR
ALT: 72 IU/L — ABNORMAL HIGH (ref 0–44)
AST: 41 IU/L — ABNORMAL HIGH (ref 0–40)
Albumin/Globulin Ratio: 1.3 (ref 1.2–2.2)
Albumin: 3.5 g/dL — ABNORMAL LOW (ref 3.7–4.7)
Alkaline Phosphatase: 109 IU/L (ref 48–121)
BUN/Creatinine Ratio: 21 (ref 10–24)
BUN: 40 mg/dL — ABNORMAL HIGH (ref 8–27)
Bilirubin Total: 0.3 mg/dL (ref 0.0–1.2)
CO2: 24 mmol/L (ref 20–29)
Calcium: 9.4 mg/dL (ref 8.6–10.2)
Chloride: 101 mmol/L (ref 96–106)
Creatinine, Ser: 1.87 mg/dL — ABNORMAL HIGH (ref 0.76–1.27)
GFR calc Af Amer: 39 mL/min/{1.73_m2} — ABNORMAL LOW (ref >59)
GFR calc non Af Amer: 34 mL/min/{1.73_m2} — ABNORMAL LOW (ref >59)
Globulin, Total: 2.8 g/dL (ref 1.5–4.5)
Glucose: 172 mg/dL — ABNORMAL HIGH (ref 65–99)
Potassium: 4.5 mmol/L (ref 3.5–5.2)
Sodium: 139 mmol/L (ref 134–144)
Total Protein: 6.3 g/dL (ref 6.0–8.5)

## 2020-01-04 NOTE — Progress Notes (Signed)
BP (!) 108/58   Pulse 72   Temp 98.2 F (36.8 C)   Ht _0  (1.651 m)   Wt 162 lb (73.5 kg)   SpO2 99%   BMI 26.96 kg/m    Subjective:   Patient ID: Evan Stanley, male    DOB: [birth year], 79 y.o.   MRN: [id number]  HPI: Evan Stanley is a 79 y.o. male presenting on 01/04/2020 for Medical Management of Chronic Issues and Cerebrovascular Accident   HPI Patient is coming in for hospital follow up for TIA and new afib.  He was started on eliquis and has f/u for this.  He has home health PT and his strength is coming back.  He is improving and can walk with a walker.  The left leg and balance issues are his biggest thing that is left. They are both steadily improving. Patient was in hospital from 12/24/19 until 12/27/19  Relevant past medical, surgical, family and social history reviewed and updated as indicated. Interim medical history since our last visit reviewed. Allergies and medications reviewed and updated.  Review of Systems  Constitutional: Negative for chills and fever.  Eyes: Negative for visual disturbance.  Respiratory: Negative for shortness of breath and wheezing.   Cardiovascular: Negative for chest pain and leg swelling.  Musculoskeletal: Positive for gait problem. Negative for back pain.  Skin: Negative for rash.  Neurological: Positive for weakness. Negative for dizziness and light-headedness.  All other systems reviewed and are negative.   Per HPI unless specifically indicated above   Allergies as of 01/04/2020   No Known Allergies     Medication List       Accurate as of January 04, 2020  8:37 AM. If you have any questions, ask your nurse or doctor.        apixaban 5 MG Tabs tablet Commonly known as: ELIQUIS Take 1 tablet (5 mg total) by mouth 2 (two) times daily. What changed: Another medication with the same name was removed. Continue taking this medication, and follow the directions you see here. Changed by: Fransisca Kaufmann Jaiden Wahab, MD     atorvastatin 40 MG tablet Commonly known as: LIPITOR TAKE 1 TABLET BY MOUTH  DAILY What changed: when to take this   atropine 1 % ophthalmic solution Place 1 drop into the left eye 3 (three) times daily as needed (eye pain).   B-D UF III MINI PEN NEEDLES 31G X 5 MM Misc Generic drug: Insulin Pen Needle USE 4 PENNEEDLES DAILY TO  INJECT INSULIN Dx E11.9   cetirizine 10 MG tablet Commonly known as: ZYRTEC Take 10 mg by mouth daily.   hydrochlorothiazide 25 MG tablet Commonly known as: HYDRODIURIL Take 1 tablet (25 mg total) by mouth daily.   insulin lispro 100 UNIT/ML injection Commonly known as: HUMALOG Inject 6 Units into the skin daily before supper.   latanoprost 0.005 % ophthalmic solution Commonly known as: XALATAN Place 1 drop into the left eye at bedtime.   lisinopril 40 MG tablet Commonly known as: ZESTRIL Take 1 tablet (40 mg total) by mouth daily.   metoprolol succinate 100 MG 24 hr tablet Commonly known as: TOPROL-XL Take with or immediately following a meal. What changed:   how much to take  how to take this  when to take this   OneTouch Delica Plus [id number] Misc Test BS BID Dx E11.9   OneTouch Ultra test strip Generic drug: glucose blood Test BS BID Dx E11.9   [name] FlexTouch 200  UNIT/ML FlexTouch Pen Generic drug: insulin degludec Inject 50 Units into the skin daily.   Trulicity 1.5 MD/4.7WL Sopn Generic drug: Dulaglutide Inject 1.5 mg into the skin once a week. What changed: when to take this   vitamin C 500 MG tablet Commonly known as: ASCORBIC ACID Take 500 mg by mouth daily.   vitamin E 1000 UNIT capsule Generic drug: vitamin E Take 1,000 Units by mouth daily.        Objective:   BP (!) 108/58   Pulse 72   Temp 98.2 F (36.8 C)   Ht _0  (1.651 m)   Wt 162 lb (73.5 kg)   SpO2 99%   BMI 26.96 kg/m   Wt Readings from Last 3 Encounters:  01/04/20 162 lb (73.5 kg)  12/24/19 165 lb 9.1 oz (75.1 kg)  12/21/19 163 lb  (73.9 kg)    Physical Exam Vitals and nursing note reviewed.  Constitutional:      General: He is not in acute distress.    Appearance: He is well-developed. He is not diaphoretic.  Eyes:     General: No scleral icterus.    Conjunctiva/sclera: Conjunctivae normal.  Neck:     Thyroid: No thyromegaly.  Cardiovascular:     Rate and Rhythm: Normal rate and regular rhythm.     Heart sounds: Normal heart sounds. No murmur heard.   Pulmonary:     Effort: Pulmonary effort is normal. No respiratory distress.     Breath sounds: Normal breath sounds. No wheezing.  Musculoskeletal:        General: Normal range of motion.     Cervical back: Neck supple.  Lymphadenopathy:     Cervical: No cervical adenopathy.  Skin:    General: Skin is warm and dry.     Findings: No rash.  Neurological:     Mental Status: He is alert and oriented to person, place, and time.     Motor: Weakness (4/5 strength in left leg) present.     Coordination: Coordination normal.     Gait: Gait abnormal.  Psychiatric:        Behavior: Behavior normal.       Assessment & Plan:   Problem List Items Addressed This Visit      Cardiovascular and Mediastinum   Paroxysmal A-fib ([place])   Relevant Orders   CBC with Differential/Platelet   CMP14+EGFR     Other   History of TIA (transient ischemic attack) - Primary   Relevant Orders   CBC with Differential/Platelet   CMP14+EGFR      Patient has Neuro follow up , has home PT,  Follow up plan: Return in about 4 weeks (around 02/01/2020), or if symptoms worsen or fail to improve, for stroke f/u with pcp.  Counseling provided for all of the vaccine components Orders Placed This Encounter  Procedures  . CBC with Differential/Platelet  . [place] Alieu Finnigan, MD [facility] 01/04/2020, 8:37 AM

## 2020-01-05 ENCOUNTER — Other Ambulatory Visit: Payer: Self-pay | Admitting: Endocrinology

## 2020-01-05 DIAGNOSIS — I2510 Atherosclerotic heart disease of native coronary artery without angina pectoris: Secondary | ICD-10-CM | POA: Diagnosis not present

## 2020-01-05 DIAGNOSIS — I1310 Hypertensive heart and chronic kidney disease without heart failure, with stage 1 through stage 4 chronic kidney disease, or unspecified chronic kidney disease: Secondary | ICD-10-CM | POA: Diagnosis not present

## 2020-01-05 DIAGNOSIS — H5462 Unqualified visual loss, left eye, normal vision right eye: Secondary | ICD-10-CM | POA: Diagnosis not present

## 2020-01-05 DIAGNOSIS — I69398 Other sequelae of cerebral infarction: Secondary | ICD-10-CM | POA: Diagnosis not present

## 2020-01-05 DIAGNOSIS — I69354 Hemiplegia and hemiparesis following cerebral infarction affecting left non-dominant side: Secondary | ICD-10-CM | POA: Diagnosis not present

## 2020-01-08 ENCOUNTER — Ambulatory Visit: Payer: Medicare Other | Admitting: Endocrinology

## 2020-01-08 DIAGNOSIS — H5462 Unqualified visual loss, left eye, normal vision right eye: Secondary | ICD-10-CM | POA: Diagnosis not present

## 2020-01-08 DIAGNOSIS — I1310 Hypertensive heart and chronic kidney disease without heart failure, with stage 1 through stage 4 chronic kidney disease, or unspecified chronic kidney disease: Secondary | ICD-10-CM | POA: Diagnosis not present

## 2020-01-08 DIAGNOSIS — I2510 Atherosclerotic heart disease of native coronary artery without angina pectoris: Secondary | ICD-10-CM | POA: Diagnosis not present

## 2020-01-08 DIAGNOSIS — I69354 Hemiplegia and hemiparesis following cerebral infarction affecting left non-dominant side: Secondary | ICD-10-CM | POA: Diagnosis not present

## 2020-01-08 DIAGNOSIS — I69398 Other sequelae of cerebral infarction: Secondary | ICD-10-CM | POA: Diagnosis not present

## 2020-01-10 ENCOUNTER — Other Ambulatory Visit: Payer: Self-pay

## 2020-01-10 ENCOUNTER — Ambulatory Visit: Payer: Medicare Other | Admitting: Endocrinology

## 2020-01-10 ENCOUNTER — Encounter: Payer: Self-pay | Admitting: Endocrinology

## 2020-01-10 VITALS — BP 110/70 | HR 57 | Ht 65.0 in | Wt 163.2 lb

## 2020-01-10 DIAGNOSIS — E1165 Type 2 diabetes mellitus with hyperglycemia: Secondary | ICD-10-CM | POA: Diagnosis not present

## 2020-01-10 DIAGNOSIS — Z794 Long term (current) use of insulin: Secondary | ICD-10-CM

## 2020-01-10 NOTE — Patient Instructions (Signed)
Check blood sugars on waking up 3 days a week  Also check blood sugars about 2 hours after meals and do this after different meals by rotation  Recommended blood sugar levels on waking up are 90-130 and about 2 hours after meal is 130-160  Please bring your blood sugar monitor to each visit, thank you

## 2020-01-10 NOTE — Progress Notes (Signed)
Patient ID: Evan Stanley, male   DOB: May 18, 1941, 79 y.o.   MRN: [id number]          Reason for Appointment: Follow-up for Type 2 Diabetes    History of Present Illness:          Date of diagnosis of type 2 diabetes mellitus: 2010       Background history:   He thinks he was diagnosed to have diabetes after his coronary bypass surgery and blood sugars are not very high He had been on metformin until about 05/2017 Also at some point had taken glimepiride Usually appears to have had A1c just above 7%  Recent history:   INSULIN regimen is: [place]  50  units daily, Humalog  usually 6 units at dinner     Non-insulin hypoglycemic drugs the patient is taking are: Trulicity 1.5 mg weekly  His A1c is higher at 7.4 compared to 5.8  Current management, blood sugar patterns and problems identified:  He does not know why his A1c is higher  His blood sugars may not be as low as they were on the last visit but they are not generally higher than target even after evening meal  However in diet is still not low-fat and frequently will have hot dogs and fried food  Losing weight is about the same  He is not able to do any exercise  Also no hypoglycemia, previously would have some readings in the 60s before his [name] was reduced  Not clear if his blood sugars are higher after breakfast but he does have some carbohydrate        Side effects from medications have been: None  Glucose monitoring:  done about 2 times a day         Glucometer: One Touch.       Blood Glucose readings by review of home monitor download show   PRE-MEAL Fasting Lunch Dinner Bedtime Overall  Glucose range:  93-157      Mean/median:  115     136   POST-MEAL PC Breakfast PC Lunch PC Dinner  Glucose range:    91-203  Mean/median:    160   Previous readings:  PRE-MEAL Fasting Lunch Dinner Bedtime Overall  Glucose range: 103-134      Mean/median:  118     142   POST-MEAL PC Breakfast PC Lunch PC  Dinner  Glucose range:    129-325  Mean/median:    205     Self-care: The diet that the patient has been following is: tries to limit sweets.     Meal times are:  Breakfast is at after 9 AM, diiner 6  Typical meal intake: Breakfast is yogurt at times, otherwise eggs            Dietician visit, most recent: 2019                Weight history:  Wt Readings from Last 3 Encounters:  01/10/20 163 lb 3.2 oz (74 kg)  01/04/20 162 lb (73.5 kg)  12/24/19 165 lb 9.1 oz (75.1 kg)    Glycemic control:   Lab Results  Component Value Date   HGBA1C 7.4 (H) 12/25/2019   HGBA1C 5.8 (A) 09/06/2019   HGBA1C 6.6 (H) 05/14/2019   Lab Results  Component Value Date   MICROALBUR 5.1 (H) 09/06/2019   LDLCALC 47 12/25/2019   CREATININE 1.87 (H) 01/04/2020   Lab Results  Component Value Date   MICRALBCREAT 2.4 09/06/2019  Lab Results  Component Value Date   FRUCTOSAMINE 323 (H) 04/22/2018      Allergies as of 01/10/2020   No Known Allergies     Medication List       Accurate as of January 10, 2020 10:58 AM. If you have any questions, ask your nurse or doctor.        apixaban 5 MG Tabs tablet Commonly known as: ELIQUIS Take 1 tablet (5 mg total) by mouth 2 (two) times daily.   atorvastatin 40 MG tablet Commonly known as: LIPITOR TAKE 1 TABLET BY MOUTH  DAILY What changed: when to take this   atropine 1 % ophthalmic solution Place 1 drop into the left eye 3 (three) times daily as needed (eye pain).   B-D UF III MINI PEN NEEDLES 31G X 5 MM Misc Generic drug: Insulin Pen Needle USE 4 PENNEEDLES DAILY TO  INJECT INSULIN Dx E11.9   cetirizine 10 MG tablet Commonly known as: ZYRTEC Take 10 mg by mouth daily.   hydrochlorothiazide 25 MG tablet Commonly known as: HYDRODIURIL Take 1 tablet (25 mg total) by mouth daily.   insulin lispro 100 UNIT/ML injection Commonly known as: HUMALOG Inject 6 Units into the skin daily before supper.   latanoprost 0.005 % ophthalmic  solution Commonly known as: XALATAN Place 1 drop into the left eye at bedtime.   lisinopril 40 MG tablet Commonly known as: ZESTRIL Take 1 tablet (40 mg total) by mouth daily.   metoprolol succinate 100 MG 24 hr tablet Commonly known as: TOPROL-XL Take with or immediately following a meal. What changed:   how much to take  how to take this  when to take this   OneTouch Delica Plus [id number] Misc Test BS BID Dx E11.9   OneTouch Ultra test strip Generic drug: glucose blood Test BS BID Dx E11.9   [name] FlexTouch 200 UNIT/ML FlexTouch Pen Generic drug: insulin degludec Inject 50 Units into the skin daily.   Trulicity 1.5 FK/8.1EX Sopn Generic drug: Dulaglutide INJECT THE CONTENTS OF 1  PEN (1.5MG) INTO THE SKIN  WEEKLY AS DIRECTED .   vitamin C 500 MG tablet Commonly known as: ASCORBIC ACID Take 500 mg by mouth daily.   vitamin E 1000 UNIT capsule Generic drug: vitamin E Take 1,000 Units by mouth daily.       Allergies: No Known Allergies  Past Medical History:  Diagnosis Date   AKI (acute kidney injury) ([place])    Allergy    Anxiety    Arthritis    Right shoulder   Atrial fibrillation (HCC)    Carotid artery stenosis    Cataract    CHF (congestive heart failure) (HCC)    CVA (cerebral vascular accident) ([place]) 04/28/2016   Decreased vision    left eye   Diabetes mellitus without complication ([place])    Takes Metformin   Hyperlipidemia    Hypertension    Salmonella bacteremia 04/29/2016   Sepsis ([place]) 04/29/2016   Stroke ([place]) 11/18/2015   no deficits   TIA (transient ischemic attack)    affected left eye   Urgency of urination     Past Surgical History:  Procedure Laterality Date   AORTIC VALVE REPLACEMENT     BACK SURGERY  80s   CARDIAC VALVE REPLACEMENT  11/01/2008   21-mm Edwards Pericardial Magna - Ease valve   ENDARTERECTOMY Right 01/13/2016   Procedure: ENDARTERECTOMY CAROTID - RIGHT;  Surgeon: Conrad Posen, MD;   Location: [facility];  Service: Vascular;  Laterality: Right;   EYE SURGERY     laser surgery, left eye  Left 10-29-2015    retinal surgery/ Deloria Lair MD    PERIPHERAL VASCULAR CATHETERIZATION Right 11/18/2015   Procedure: Carotid Angiography;  Surgeon: Conrad [place]land, MD;  Location: [facility];  Service: Cardiovascular;  Laterality: Right;   PERIPHERAL VASCULAR CATHETERIZATION N/A 11/18/2015   Procedure: Aortic Arch Angiography;  Surgeon: Conrad [place], MD;  Location: [facility];  Service: Cardiovascular;  Laterality: N/A;   TEE WITHOUT CARDIOVERSION N/A 05/05/2016   Procedure: TRANSESOPHAGEAL ECHOCARDIOGRAM (TEE);  Surgeon: Lelon Perla, MD;  Location[facility] ENDOSCOPY;  Service: Cardiovascular;  Laterality: N/A;    Family History  Problem Relation Age of Onset   Heart disease Mother    Cancer Mother        breast   Stroke Father 37   Hypertension Father    Early death Sister        infant death   Heart disease Brother    Hydrocephalus Brother    Heart disease Brother    Diabetes Brother    Cancer Brother        prostat   Healthy Son    Healthy Son     Social History:  reports that he quit smoking about 33 years ago. His smoking use included cigarettes. He has a 40.00 pack-year smoking history. He has never used smokeless tobacco. He reports that he does not drink alcohol and does not use drugs.   Review of Systems   Lipid history: Currently on atorvastatin 40 mg daily prescribed by his PCP   Has history of CAD and CVD    Lab Results  Component Value Date   CHOL 89 12/25/2019   HDL 30 (L) 12/25/2019   LDLCALC 47 12/25/2019   LDLDIRECT 62 02/16/2017   TRIG 60 12/25/2019   CHOLHDL 3.0 12/25/2019           Hypertension:  His treatment includes hydrochlorothiazide 25 mg and lisinopril 40 mg daily,   prescribed by PCP he has checked at home and reported to his PCP  BP Readings from Last 3 Encounters:  01/10/20 110/70  01/04/20 (!) 108/58    12/27/19 (!) 141/67     CKD: Creatinine as follows  Lab Results  Component Value Date   CREATININE 1.87 (H) 01/04/2020   CREATININE 2.05 (H) 12/26/2019   CREATININE 1.90 (H) 12/24/2019     Most recent foot exam: 9/20  Recently had a CVA and is now on Eliquis  Physical Examination:  BP 110/70 (BP Location: Left Arm, Patient Position: Sitting, Cuff Size: Normal)    Pulse (!) 57    Ht 5' 5"  (1.651 m)    Wt 163 lb 3.2 oz (74 kg)    SpO2 98%    BMI 27.16 kg/m       ASSESSMENT:  Diabetes type 2, on insulin   See history of present illness for detailed discussion of current diabetes management, blood sugar patterns and problems identified  His A1c is 7.4 compared to 5.8  Although he is having fairly good blood sugars not clear why his A1c is high Has not had any steroids No recent weight gain  He does have only sporadic high readings and may be benefiting from Trulicity since even with high fat meals his blood sugars are not significantly high after dinner Fasting blood sugars are near normal   PLAN:    No change in insulin regimen  However we need to have him check his blood sugars by rotation at different time  He needs to cut back on his high fat foods especially with his recent CVA  Continue Trulicity   Patient Instructions  Check blood sugars on waking up 3 days a week  Also check blood sugars about 2 hours after meals and do this after different meals by rotation  Recommended blood sugar levels on waking up are 90-130 and about 2 hours after meal is 130-160  Please bring your blood sugar monitor to each visit, thank you        [name] 01/10/2020, 10:58 AM   Note: This office note was prepared with Dragon voice recognition system technology. Any transcriptional errors that result from this process are unintentional.  Addendum: Labs unremarkable

## 2020-01-12 DIAGNOSIS — I1310 Hypertensive heart and chronic kidney disease without heart failure, with stage 1 through stage 4 chronic kidney disease, or unspecified chronic kidney disease: Secondary | ICD-10-CM | POA: Diagnosis not present

## 2020-01-12 DIAGNOSIS — I69398 Other sequelae of cerebral infarction: Secondary | ICD-10-CM | POA: Diagnosis not present

## 2020-01-12 DIAGNOSIS — I2510 Atherosclerotic heart disease of native coronary artery without angina pectoris: Secondary | ICD-10-CM | POA: Diagnosis not present

## 2020-01-12 DIAGNOSIS — I69354 Hemiplegia and hemiparesis following cerebral infarction affecting left non-dominant side: Secondary | ICD-10-CM | POA: Diagnosis not present

## 2020-01-12 DIAGNOSIS — H5462 Unqualified visual loss, left eye, normal vision right eye: Secondary | ICD-10-CM | POA: Diagnosis not present

## 2020-01-15 DIAGNOSIS — I2510 Atherosclerotic heart disease of native coronary artery without angina pectoris: Secondary | ICD-10-CM | POA: Diagnosis not present

## 2020-01-15 DIAGNOSIS — I1310 Hypertensive heart and chronic kidney disease without heart failure, with stage 1 through stage 4 chronic kidney disease, or unspecified chronic kidney disease: Secondary | ICD-10-CM | POA: Diagnosis not present

## 2020-01-15 DIAGNOSIS — I69398 Other sequelae of cerebral infarction: Secondary | ICD-10-CM | POA: Diagnosis not present

## 2020-01-15 DIAGNOSIS — I69354 Hemiplegia and hemiparesis following cerebral infarction affecting left non-dominant side: Secondary | ICD-10-CM | POA: Diagnosis not present

## 2020-01-15 DIAGNOSIS — H5462 Unqualified visual loss, left eye, normal vision right eye: Secondary | ICD-10-CM | POA: Diagnosis not present

## 2020-01-16 ENCOUNTER — Other Ambulatory Visit: Payer: Self-pay

## 2020-01-16 ENCOUNTER — Ambulatory Visit (INDEPENDENT_AMBULATORY_CARE_PROVIDER_SITE_OTHER): Payer: Medicare Other

## 2020-01-16 ENCOUNTER — Other Ambulatory Visit: Payer: Self-pay | Admitting: *Deleted

## 2020-01-16 DIAGNOSIS — Z79899 Other long term (current) drug therapy: Secondary | ICD-10-CM

## 2020-01-16 DIAGNOSIS — I2510 Atherosclerotic heart disease of native coronary artery without angina pectoris: Secondary | ICD-10-CM

## 2020-01-16 DIAGNOSIS — I69398 Other sequelae of cerebral infarction: Secondary | ICD-10-CM

## 2020-01-16 DIAGNOSIS — M19011 Primary osteoarthritis, right shoulder: Secondary | ICD-10-CM

## 2020-01-16 DIAGNOSIS — I70209 Unspecified atherosclerosis of native arteries of extremities, unspecified extremity: Secondary | ICD-10-CM

## 2020-01-16 DIAGNOSIS — G319 Degenerative disease of nervous system, unspecified: Secondary | ICD-10-CM

## 2020-01-16 DIAGNOSIS — I1310 Hypertensive heart and chronic kidney disease without heart failure, with stage 1 through stage 4 chronic kidney disease, or unspecified chronic kidney disease: Secondary | ICD-10-CM

## 2020-01-16 DIAGNOSIS — H5462 Unqualified visual loss, left eye, normal vision right eye: Secondary | ICD-10-CM | POA: Diagnosis not present

## 2020-01-16 DIAGNOSIS — Z7901 Long term (current) use of anticoagulants: Secondary | ICD-10-CM

## 2020-01-16 DIAGNOSIS — E782 Mixed hyperlipidemia: Secondary | ICD-10-CM

## 2020-01-16 DIAGNOSIS — Z794 Long term (current) use of insulin: Secondary | ICD-10-CM

## 2020-01-16 DIAGNOSIS — F419 Anxiety disorder, unspecified: Secondary | ICD-10-CM

## 2020-01-16 DIAGNOSIS — I69354 Hemiplegia and hemiparesis following cerebral infarction affecting left non-dominant side: Secondary | ICD-10-CM

## 2020-01-16 DIAGNOSIS — E1122 Type 2 diabetes mellitus with diabetic chronic kidney disease: Secondary | ICD-10-CM

## 2020-01-16 DIAGNOSIS — E1136 Type 2 diabetes mellitus with diabetic cataract: Secondary | ICD-10-CM

## 2020-01-16 DIAGNOSIS — I051 Rheumatic mitral insufficiency: Secondary | ICD-10-CM

## 2020-01-16 DIAGNOSIS — M4802 Spinal stenosis, cervical region: Secondary | ICD-10-CM

## 2020-01-16 DIAGNOSIS — E1151 Type 2 diabetes mellitus with diabetic peripheral angiopathy without gangrene: Secondary | ICD-10-CM

## 2020-01-16 DIAGNOSIS — Z953 Presence of xenogenic heart valve: Secondary | ICD-10-CM

## 2020-01-16 DIAGNOSIS — S80212D Abrasion, left knee, subsequent encounter: Secondary | ICD-10-CM

## 2020-01-16 DIAGNOSIS — N1832 Chronic kidney disease, stage 3b: Secondary | ICD-10-CM

## 2020-01-16 DIAGNOSIS — I480 Paroxysmal atrial fibrillation: Secondary | ICD-10-CM

## 2020-01-16 NOTE — Patient Outreach (Addendum)
[facility]) Care Management  01/16/2020  Jaysten Essner 1941/06/19 [id number]   EMMI: Stroke  Referral date: 01/01/2020 Referral reason: Problems setting up rehab? Insurance: [employer]  Day #1  Per chart review, patient is currently being followed by [facility] Cabin crew.    Spoke with Chong Sicilian Embedded RNCM, states she will follow up on EMMI Red Alert on 01/17/2020.   [facility] dashboard email and [place] / Epic in-basket message sent to [place].  Telephonic RNCM will close case due to patient enrolled in external program.     Anahlia Iseminger H. Annia Friendly, BSN, [place] [facility] Telephonic CM Phone: [phone] Fax: [phone]

## 2020-01-17 ENCOUNTER — Ambulatory Visit: Payer: Medicare Other | Admitting: *Deleted

## 2020-01-17 DIAGNOSIS — I480 Paroxysmal atrial fibrillation: Secondary | ICD-10-CM

## 2020-01-17 DIAGNOSIS — I639 Cerebral infarction, unspecified: Secondary | ICD-10-CM

## 2020-01-17 NOTE — Chronic Care Management (AMB) (Signed)
  Care Management   Follow Up Note   01/17/2020 Name: Evan Stanley MRN: [id number] DOB: 1941/03/09  Referred by: [name], DO Reason for referral : Chronic Care Management (Red EMMI flag s/p hosp discharge)   Evan Stanley is a 79 y.o. year old male who is a primary care patient of [name], DO. He has refused CCM services in the past and has not been enrolled in the embedded CCM program. He was admitted for a CVA, likely secondary to Afib, on 12/24/19 and discharged on 12/27/19.   Unsuccessful telephone outreach to follow-up on red EMMI flag. [name] received an automated EMMI call s/p discharge and answered positively that he was having trouble setting up rehab. Per last PCP office note, this has been resolved and patient is receiving home health PT.      Plan:  Left HIPAA compliant voicemail requesting a return telephone call Continue home health PT Neurology appointment with [facility] 01/31/20 Dr [name] 02/09/20 Needs to schedule cardiology f/u   Chong Sicilian, BSN, RN-BC [place] [facility] [place] [facility] Direct Dial: [phone]

## 2020-01-18 ENCOUNTER — Other Ambulatory Visit: Payer: Self-pay

## 2020-01-18 ENCOUNTER — Other Ambulatory Visit: Payer: Medicare Other

## 2020-01-18 DIAGNOSIS — R748 Abnormal levels of other serum enzymes: Secondary | ICD-10-CM

## 2020-01-19 DIAGNOSIS — I69398 Other sequelae of cerebral infarction: Secondary | ICD-10-CM | POA: Diagnosis not present

## 2020-01-19 DIAGNOSIS — H5462 Unqualified visual loss, left eye, normal vision right eye: Secondary | ICD-10-CM | POA: Diagnosis not present

## 2020-01-19 DIAGNOSIS — I1310 Hypertensive heart and chronic kidney disease without heart failure, with stage 1 through stage 4 chronic kidney disease, or unspecified chronic kidney disease: Secondary | ICD-10-CM | POA: Diagnosis not present

## 2020-01-19 DIAGNOSIS — I2510 Atherosclerotic heart disease of native coronary artery without angina pectoris: Secondary | ICD-10-CM | POA: Diagnosis not present

## 2020-01-19 DIAGNOSIS — I69354 Hemiplegia and hemiparesis following cerebral infarction affecting left non-dominant side: Secondary | ICD-10-CM | POA: Diagnosis not present

## 2020-01-19 LAB — CMP14+EGFR
ALT: 30 IU/L (ref 0–44)
AST: 16 IU/L (ref 0–40)
Albumin/Globulin Ratio: 1.5 (ref 1.2–2.2)
Albumin: 3.7 g/dL (ref 3.7–4.7)
Alkaline Phosphatase: 109 IU/L (ref 48–121)
BUN/Creatinine Ratio: 16 (ref 10–24)
BUN: 28 mg/dL — ABNORMAL HIGH (ref 8–27)
Bilirubin Total: 0.4 mg/dL (ref 0.0–1.2)
CO2: 27 mmol/L (ref 20–29)
Calcium: 9.1 mg/dL (ref 8.6–10.2)
Chloride: 107 mmol/L — ABNORMAL HIGH (ref 96–106)
Creatinine, Ser: 1.71 mg/dL — ABNORMAL HIGH (ref 0.76–1.27)
GFR calc Af Amer: 44 mL/min/{1.73_m2} — ABNORMAL LOW (ref >59)
GFR calc non Af Amer: 38 mL/min/{1.73_m2} — ABNORMAL LOW (ref >59)
Globulin, Total: 2.4 g/dL (ref 1.5–4.5)
Glucose: 165 mg/dL — ABNORMAL HIGH (ref 65–99)
Potassium: 4.7 mmol/L (ref 3.5–5.2)
Sodium: 147 mmol/L — ABNORMAL HIGH (ref 134–144)
Total Protein: 6.1 g/dL (ref 6.0–8.5)

## 2020-01-22 DIAGNOSIS — I69354 Hemiplegia and hemiparesis following cerebral infarction affecting left non-dominant side: Secondary | ICD-10-CM | POA: Diagnosis not present

## 2020-01-22 DIAGNOSIS — I2510 Atherosclerotic heart disease of native coronary artery without angina pectoris: Secondary | ICD-10-CM | POA: Diagnosis not present

## 2020-01-22 DIAGNOSIS — I69398 Other sequelae of cerebral infarction: Secondary | ICD-10-CM | POA: Diagnosis not present

## 2020-01-22 DIAGNOSIS — I1310 Hypertensive heart and chronic kidney disease without heart failure, with stage 1 through stage 4 chronic kidney disease, or unspecified chronic kidney disease: Secondary | ICD-10-CM | POA: Diagnosis not present

## 2020-01-22 DIAGNOSIS — H5462 Unqualified visual loss, left eye, normal vision right eye: Secondary | ICD-10-CM | POA: Diagnosis not present

## 2020-01-26 DIAGNOSIS — I2510 Atherosclerotic heart disease of native coronary artery without angina pectoris: Secondary | ICD-10-CM | POA: Diagnosis not present

## 2020-01-26 DIAGNOSIS — H5462 Unqualified visual loss, left eye, normal vision right eye: Secondary | ICD-10-CM | POA: Diagnosis not present

## 2020-01-26 DIAGNOSIS — I69354 Hemiplegia and hemiparesis following cerebral infarction affecting left non-dominant side: Secondary | ICD-10-CM | POA: Diagnosis not present

## 2020-01-26 DIAGNOSIS — I1310 Hypertensive heart and chronic kidney disease without heart failure, with stage 1 through stage 4 chronic kidney disease, or unspecified chronic kidney disease: Secondary | ICD-10-CM | POA: Diagnosis not present

## 2020-01-26 DIAGNOSIS — I69398 Other sequelae of cerebral infarction: Secondary | ICD-10-CM | POA: Diagnosis not present

## 2020-01-31 ENCOUNTER — Ambulatory Visit: Payer: Medicare Other | Admitting: Adult Health

## 2020-01-31 ENCOUNTER — Encounter: Payer: Self-pay | Admitting: Adult Health

## 2020-01-31 ENCOUNTER — Other Ambulatory Visit: Payer: Self-pay

## 2020-01-31 VITALS — BP 118/80 | HR 70 | Ht 65.0 in | Wt 166.2 lb

## 2020-01-31 DIAGNOSIS — E785 Hyperlipidemia, unspecified: Secondary | ICD-10-CM

## 2020-01-31 DIAGNOSIS — E1159 Type 2 diabetes mellitus with other circulatory complications: Secondary | ICD-10-CM | POA: Diagnosis not present

## 2020-01-31 DIAGNOSIS — E1121 Type 2 diabetes mellitus with diabetic nephropathy: Secondary | ICD-10-CM | POA: Diagnosis not present

## 2020-01-31 DIAGNOSIS — I10 Essential (primary) hypertension: Secondary | ICD-10-CM

## 2020-01-31 DIAGNOSIS — E1169 Type 2 diabetes mellitus with other specified complication: Secondary | ICD-10-CM | POA: Diagnosis not present

## 2020-01-31 DIAGNOSIS — I480 Paroxysmal atrial fibrillation: Secondary | ICD-10-CM | POA: Diagnosis not present

## 2020-01-31 DIAGNOSIS — Z8673 Personal history of transient ischemic attack (TIA), and cerebral infarction without residual deficits: Secondary | ICD-10-CM | POA: Diagnosis not present

## 2020-01-31 DIAGNOSIS — N1832 Chronic kidney disease, stage 3b: Secondary | ICD-10-CM

## 2020-01-31 DIAGNOSIS — I152 Hypertension secondary to endocrine disorders: Secondary | ICD-10-CM

## 2020-01-31 NOTE — Patient Instructions (Signed)
Continue Eliquis (apixaban) daily  and atorvastatin for secondary stroke prevention  Continue to follow up with PCP regarding cholesterol, blood pressure and diabetes management   Please ensure you schedule follow up with cardiology and discuss atrial fibrillation and possible need of cardiac monitoring - cardiology will continue to manage and prescribe eliquis  Would recommend sleep study evaluation for secondary stroke prevention - you can call office if interested in pursing  Your arm movement sounds to be like a dystonia which can occur from your prior strokes. Please call office if any worsening or if starts to interfere with daily activity  Continue to monitor blood pressure at home  Maintain strict control of hypertension with blood pressure goal below 130/90, diabetes with hemoglobin A1c goal below 6.5% and cholesterol with LDL cholesterol (bad cholesterol) goal below 70 mg/dL. I also advised the patient to eat a healthy diet with plenty of whole grains, cereals, fruits and vegetables, exercise regularly and maintain ideal body weight.  Followup in the future with me in 4 months or call earlier if needed       Thank you for coming to see [place] at [facility]. I hope we have been able to provide you high quality care today.  You may receive a patient satisfaction survey over the next few weeks. We would appreciate your feedback and comments so that we may continue to improve ourselves and the health of our patients.

## 2020-01-31 NOTE — Progress Notes (Signed)
[facility] [address] [place]. [place] [address] [phone]       HOSPITAL FOLLOW UP NOTE  [name] Date of Birth:  [birth year] Medical Record Number:  [id number]   Reason for Referral:  hospital stroke follow up    SUBJECTIVE:   CHIEF COMPLAINT:  Chief Complaint  Patient presents with  . Hospitalization Follow-up    Was in the hospital back in June for CVA. States he has been doing well at home, finished at home PT last Friday.     HPI:   [name] is a 79 y.o. male with history of right carotid dissection with small strokes affecting the right hemisphere without any residual deficits,listed atrial fibrillationin problem list but not on anticoagulation, chronically occluded left ICA, diabetes, hypertension, hyperlipidemia, blindness in the left eye,CAD status post CABG with AVR, peripheral vascular disease who presented on 12/24/2019 with recurrent falls since 12/20/2019 with altered gait.  Stroke work-up revealed several right frontal lobe and right parietal lobe cortical/subcortical infarcts in setting of multiple chronic intracranial stenosis/occlusion, current infarcts felt to be embolic secondary to unclear source possibly A. fib.  Unclear prior history of A. fib with documented remote history.  Recommended initiating Eliquis for secondary stroke prevention.  No carotid disease post right CEA 2015.  HTN stable.  LDL 47 I recommend continuation of atorvastatin 40 mg daily.  Uncontrolled DM with A1c 7.4.  Other stroke risk factors include advanced age, former tobacco use, history of stroke in 2017, CAD s/p CABG, hx of CHF, known carotid occlusion, s/p AV replacement with bioprosthetic valve.  Evaluated by therapies and discharged to CIR for ongoing therapy needs.  Stroke:   Several R frontal lobe and R parietal lobe cortical / subcortical infarcts in setting of multiple chronic intracranial stenoses/ occlusion, current infarcts felt to be   embolic secondary to unclear source. Possible AF, formal dx unclear.   CT head No acute abnormality. Small vessel disease. Atrophy.   MRI  Several tiny cortical / subcortical infarcts posterior R frontal lobe and R parietal lobe. Multiple chronic lacunes B white matter. Small vessel disease.   MRA Head Known R V4, L ICA and L ICA siphon occlusion. Reconstitution paraclinoid L ICA. Sinus dz  MRA neck chronic L ICA, distal R V4 occlusions. R ECA origin severe stenosis. origin L ECA moderate to severe stenosis. Distal L subclavian moderate stenosis.   2D Echo normal ejection fraction.  LDL 47  HgbA1c 7.4  Lovenox 40 mg sq daily for VTE prophylaxis  aspirin 81 mg daily prior to admission, now on aspirin 81 mg daily.  Recommend change to Eliquis  Therapy recommendations: HH PT/OT  Disposition: home  Today, 01/31/2020, [name] is being seen for hospital follow-up accompanied by his wife. Residual deficit of imbalance and mild LLE weakness but overall improving. He has since completed [facility] PT. Use of cane for long distance ambulation. Denies any recent falls. Continues on eliquis without bleeding or bruising. Remains on atorvastatin without myalgias. Blood pressure today 118/80. Glucose levels stable. He does report history of occasional abnormal movements of RUE as if he is unable to control lasting from 2-10 minutes - wife has noticed more so since his recent stroke. No pain, weakness, or numbness/tingling associates. No further concerns at this time.     ROS:   14 system review of systems performed and negative with exception of weakness, imbalance  PMH:  Past Medical History:  Diagnosis Date  . AKI (  acute kidney injury) ([place])   . Allergy   . Anxiety   . Arthritis    Right shoulder  . Atrial fibrillation ([place])   . Carotid artery stenosis   . Cataract   . CHF (congestive heart failure) ([place])   . CVA (cerebral vascular accident) ([place]) 04/28/2016  . Decreased vision    left eye    . Diabetes mellitus without complication ([place])    Takes Metformin  . Hyperlipidemia   . Hypertension   . Salmonella bacteremia 04/29/2016  . Sepsis ([place]) 04/29/2016  . Stroke ([place]) 11/18/2015   no deficits  . TIA (transient ischemic attack)    affected left eye  . Urgency of urination     PSH:  Past Surgical History:  Procedure Laterality Date  . AORTIC VALVE REPLACEMENT    . BACK SURGERY  80s  . CARDIAC VALVE REPLACEMENT  11/01/2008   21-mm Edwards Pericardial Magna - Ease valve  . ENDARTERECTOMY Right 01/13/2016   Procedure: ENDARTERECTOMY CAROTID - RIGHT;  Surgeon: Conrad Springdale, MD;  Location: [place];  Service: Vascular;  Laterality: Right;  . EYE SURGERY    . laser surgery, left eye  Left 10-29-2015    retinal surgery/ Deloria Lair MD   . PERIPHERAL VASCULAR CATHETERIZATION Right 11/18/2015   Procedure: Carotid Angiography;  Surgeon: Conrad Bruc[place] Mills, MD;  Location: [place] [facility];  Service: Cardiovascular;  Laterality: Right;  . PERIPHERAL VASCULAR CATHETERIZATION N/A 11/18/2015   Procedure: Aortic Arch Angiography;  Surgeon: Conrad Hager[place], MD;  Location: [facility];  Service: Cardiovascular;  Laterality: N/A;  . TEE WITHOUT CARDIOVERSION N/A 05/05/2016   Procedure: TRANSESOPHAGEAL ECHOCARDIOGRAM (TEE);  Surgeon: Lelon Perla, MD;  Location: [facility];  Service: Cardiovascular;  Laterality: N/A;    Social History:  Social History   Socioeconomic History  . Marital status: Married    Spouse name: Not on file  . Number of children: 2  . Years of education: 41  . Highest education level: 11th grade  Occupational History  . Occupation: maintenance    Employer: [employer]    Comment: Retired  Tobacco Use  . Smoking status: Former Smoker    Packs/day: 2.00    Years: 20.00    Pack years: 40.00    Types: Cigarettes    Quit date: 07/20/1986    Years since quitting: 33.5  . Smokeless tobacco: Never Used  Vaping Use  . Vaping Use: Never used  Substance and Sexual  Activity  . Alcohol use: No    Alcohol/week: 0.0 standard drinks  . Drug use: No  . Sexual activity: Yes  Other Topics Concern  . Not on file  Social History Narrative  . Not on file   Social Determinants of Health   Financial Resource Strain:   . Difficulty of Paying Living Expenses:   Food Insecurity:   . Worried About Charity fundraiser in the Last Year:   . Arboriculturist in the Last Year:   Transportation Needs:   . Film/video editor (Medical):   Marland Kitchen Lack of Transportation (Non-Medical):   Physical Activity:   . Days of Exercise per Week:   . Minutes of Exercise per Session:   Stress:   . Feeling of Stress :   Social Connections:   . Frequency of Communication with Friends and Family:   . Frequency of Social Gatherings with Friends and Family:   . Attends Religious Services:   . Active Member  of Clubs or Organizations:   . Attends Archivist Meetings:   Marland Kitchen Marital Status:   Intimate Partner Violence:   . Fear of Current or Ex-Partner:   . Emotionally Abused:   Marland Kitchen Physically Abused:   . Sexually Abused:     Family History:  Family History  Problem Relation Age of Onset  . Heart disease Mother   . Cancer Mother        breast  . Stroke Father 45  . Hypertension Father   . Early death Sister        infant death  . Heart disease Brother   . Hydrocephalus Brother   . Heart disease Brother   . Diabetes Brother   . Cancer Brother        prostat  . Healthy Son   . Healthy Son     Medications:   Current Outpatient Medications on File Prior to Visit  Medication Sig Dispense Refill  . apixaban (ELIQUIS) 5 MG TABS tablet Take 1 tablet (5 mg total) by mouth 2 (two) times daily. 60 tablet 3  . atorvastatin (LIPITOR) 40 MG tablet TAKE 1 TABLET BY MOUTH  DAILY (Patient taking differently: Take 40 mg by mouth at bedtime. ) 90 tablet 1  . atropine 1 % ophthalmic solution Place 1 drop into the left eye 3 (three) times daily as needed (eye pain).    .  cetirizine (ZYRTEC) 10 MG tablet Take 10 mg by mouth daily.    . hydrochlorothiazide (HYDRODIURIL) 25 MG tablet Take 1 tablet (25 mg total) by mouth daily. 90 tablet 1  . insulin degludec (TRESIBA FLEXTOUCH) 200 UNIT/ML FlexTouch Pen Inject 50 Units into the skin daily.    . insulin lispro (HUMALOG) 100 UNIT/ML injection Inject 6 Units into the skin daily before supper.     . Insulin Pen Needle (B-D UF III MINI PEN NEEDLES) 31G X 5 MM MISC USE 4 PENNEEDLES DAILY TO  INJECT INSULIN Dx E11.9 400 each 3  . Lancets (ONETOUCH DELICA PLUS [id number]) MISC Test BS BID Dx E11.9 200 each 3  . latanoprost (XALATAN) 0.005 % ophthalmic solution Place 1 drop into the left eye at bedtime.     Marland Kitchen lisinopril (ZESTRIL) 40 MG tablet Take 1 tablet (40 mg total) by mouth daily. 90 tablet 1  . metoprolol succinate (TOPROL-XL) 100 MG 24 hr tablet Take with or immediately following a meal. (Patient taking differently: Take 100 mg by mouth daily. Take with or immediately following a meal.) 90 tablet 1  . ONETOUCH ULTRA test strip Test BS BID Dx E11.9 200 strip 3  . TRULICITY 1.5 OH/6.0VP SOPN INJECT THE CONTENTS OF 1  PEN (1.5MG) INTO THE SKIN  WEEKLY AS DIRECTED . 6 mL 3  . vitamin C (ASCORBIC ACID) 500 MG tablet Take 500 mg by mouth daily.    . vitamin E (VITAMIN E) 1000 UNIT capsule Take 1,000 Units by mouth daily.     No current facility-administered medications on file prior to visit.    Allergies:  No Known Allergies    OBJECTIVE:  Physical Exam  Vitals:   01/31/20 1036  BP: 118/80  Pulse: 70  Weight: 166 lb 3.2 oz (75.4 kg)  Height: 5' 5"  (1.651 m)   Body mass index is 27.66 kg/m. No exam data present   General: well developed, well nourished, pleasant elderly Caucasian male, seated, in no evident distress Head: head normocephalic and atraumatic.   Neck: supple with no carotid or  supraclavicular bruits Cardiovascular: regular rate and rhythm, no murmurs Musculoskeletal: no deformity Skin:  no  rash/petichiae Vascular:  Normal pulses all extremities   Neurologic Exam Mental Status: Awake and fully alert. Fluent speech and language. Oriented to place and time. Recent and remote memory intact. Attention span, concentration and fund of knowledge appropriate. Mood and affect appropriate.  Cranial Nerves: Fundoscopic exam reveals sharp disc margins. Pupils equal, briskly reactive to light. Extraocular movements full without nystagmus. Visual fields full to confrontation. Hearing intact. Facial sensation intact. Face, tongue, palate moves normally and symmetrically.  Motor: Normal bulk and tone. Normal strength in all tested extremity muscles except slight LLE weakness. Sensory.: intact to touch , pinprick , position and vibratory sensation.  Coordination: Rapid alternating movements normal in all extremities. Finger-to-nose and heel-to-shin performed accurately bilaterally. No evidence of tremors or incoordination.  Gait and Station: Arises from chair without difficulty. Stance is normal. Gait demonstrates mild hemiplegic gait with mild imbalance and use of cane Reflexes: 1+ and symmetric. Toes downgoing.     NIHSS  0 Modified Rankin  2 CHA2DS2-VASc 7 HAS-BLED 2     ASSESSMENT: Evan Stanley is a 79 y.o. year old male presented with 4-day history of altered gait and recurrent falls on 12/24/2019 with stroke work-up revealing several right frontal lobe and right parietal lobe cortical/subcortical infarcts in setting of multiple chronic intracranial stenosis/occlusion, current infarcts felt to be embolic secondary to unclear source with documented history of atrial fibrillation therefore Eliquis initiated. Vascular risk factors include chronically occluded left ICA, prior stroke history, HTN, HLD, DM, CAD s/p CABG with AVR, PVD, ?AF history, hx of CHF and diffuse intracranial stenosis.      PLAN:  1. Right frontal and right parietal strokes:  -residual deficit: mild LLE weakness and  imbalance - improving. Declines further outpatient therapy at this time but will speak to PCP at next weeks follow up visit if he wishes to have more. Discussed use of cane for fall prevention -Continue Eliquis (apixaban) daily  and atorvastatin  for secondary stroke prevention.  -Maintain strict control of hypertension with blood pressure goal below 130/90, diabetes with hemoglobin A1c goal below 6.5% and cholesterol with LDL cholesterol (bad cholesterol) goal below 70 mg/dL.  I also advised the patient to eat a healthy diet with plenty of whole grains, cereals, fruits and vegetables, exercise regularly with at least 30 minutes of continuous activity daily and maintain ideal body weight. 2. Atrial fibrillation: Advised to follow-up with cardiology and if history of atrial fibrillation cannot be confirmed, recommended to proceed with cardiac monitoring per hospital d/c recommendations. Continue eliquis at this time.  3. HTN: Advised to continue current treatment regimen.  Today's BP stable.  Advised to continue to monitor at home along with continued follow-up with PCP for management 4. HLD: Advised to continue current treatment regimen along with continued follow-up with PCP for future prescribing and monitoring of lipid panel 5. DMII: Advised to continue to monitor glucose levels at home along with continued follow-up with PCP for management and monitoring 6. Carotid stenosis: continue to follow with cardiology for surveillance monitoring  7. RUE abnormal movement: possible dystonia type movement per symptoms reported possibly from prior strokes. Does not interfere with daily activity and has not worsened. Would recommend monitoring for now but if becomes daily onset or interfere with activity, would recommend further evaluation  8. High risk sleep apnea: discussed evaluation for possible sleep apnea for secondary stroke prevention and cardiovascular risk factors. He  declines interest in evaluation at  this time but will speak further with cardiologist. He is aware of increased risk of untreated sleep apnea    Follow up in 4 months or call earlier if needed   I spent 45 minutes of face-to-face and non-face-to-face time with patient and wife.  This included previsit chart review, lab review, study review, order entry, electronic health record documentation, patient education regarding recent stroke, residual deficits, importance of managing stroke risk factors and answered all questions to patient satisfaction     Frann Rider, [facility]  [facility] [address] [place], [place] [address]  Phone [phone] Fax [phone] Note: This document was prepared with digital dictation and possible smart phrase technology. Any transcriptional errors that result from this process are unintentional.

## 2020-02-01 DIAGNOSIS — L84 Corns and callosities: Secondary | ICD-10-CM | POA: Diagnosis not present

## 2020-02-01 DIAGNOSIS — M79676 Pain in unspecified toe(s): Secondary | ICD-10-CM | POA: Diagnosis not present

## 2020-02-01 DIAGNOSIS — B351 Tinea unguium: Secondary | ICD-10-CM | POA: Diagnosis not present

## 2020-02-01 DIAGNOSIS — E1151 Type 2 diabetes mellitus with diabetic peripheral angiopathy without gangrene: Secondary | ICD-10-CM | POA: Diagnosis not present

## 2020-02-01 NOTE — Progress Notes (Signed)
I agree with the above plan 

## 2020-02-09 ENCOUNTER — Ambulatory Visit (INDEPENDENT_AMBULATORY_CARE_PROVIDER_SITE_OTHER): Payer: Medicare Other | Admitting: Family Medicine

## 2020-02-09 ENCOUNTER — Encounter: Payer: Self-pay | Admitting: Family Medicine

## 2020-02-09 ENCOUNTER — Other Ambulatory Visit: Payer: Self-pay

## 2020-02-09 VITALS — BP 122/68 | HR 73 | Temp 98.1°F | Resp 20 | Ht 65.0 in | Wt 166.5 lb

## 2020-02-09 DIAGNOSIS — Z8673 Personal history of transient ischemic attack (TIA), and cerebral infarction without residual deficits: Secondary | ICD-10-CM

## 2020-02-09 DIAGNOSIS — N1832 Chronic kidney disease, stage 3b: Secondary | ICD-10-CM | POA: Diagnosis not present

## 2020-02-09 DIAGNOSIS — I480 Paroxysmal atrial fibrillation: Secondary | ICD-10-CM

## 2020-02-09 DIAGNOSIS — E1122 Type 2 diabetes mellitus with diabetic chronic kidney disease: Secondary | ICD-10-CM

## 2020-02-09 DIAGNOSIS — E1121 Type 2 diabetes mellitus with diabetic nephropathy: Secondary | ICD-10-CM

## 2020-02-09 NOTE — Progress Notes (Signed)
Subjective: CC: Follow-up CVA PCP: [name], DO HPI:Evan Stanley is a 79 y.o. male presenting to clinic today for:  1.  History of cortical and subcortical infarcts within the posterior right frontal lobe and right parietal lobe/embolic stroke Sustained embolic CVA in June.  Is now on anticoagulation with Eliquis 5 mg twice daily.  He does report quite expensive with last copayment over $200.  However, he is compliant.  Denies any hematochezia, melena, epistaxis.  No heart palpitations, change in exercise tolerance.  No chest pain.  He has follow-up with neurology in November.  He has been discharged from PT.  He still tries to work on balance at home.  He is currently golfing and doing okay with this.  He does tend to drag his left foot behind him still.  He has been utilizing a cane for ambulation.  Follow-up with heart doctor is coming up in the next month or so well.  Next OV with Dr Dwyane Dee in September.  He admits to some dizziness with standing.  Water intake is fair.  ROS: Per HPI  No Known Allergies Past Medical History:  Diagnosis Date  . AKI (acute kidney injury) ([place])   . Allergy   . Anxiety   . Arthritis    Right shoulder  . Atrial fibrillation ([place])   . Carotid artery stenosis   . Cataract   . CHF (congestive heart failure) ([place])   . CVA (cerebral vascular accident) ([place]) 04/28/2016  . Decreased vision    left eye  . Diabetes mellitus without complication ([place])    Takes Metformin  . Hyperlipidemia   . Hypertension   . Salmonella bacteremia 04/29/2016  . Sepsis ([place]) 04/29/2016  . Stroke ([place]) 11/18/2015   no deficits  . TIA (transient ischemic attack)    affected left eye  . Urgency of urination     Current Outpatient Medications:  .  apixaban (ELIQUIS) 5 MG TABS tablet, Take 1 tablet (5 mg total) by mouth 2 (two) times daily., Disp: 60 tablet, Rfl: 3 .  atorvastatin (LIPITOR) 40 MG tablet, TAKE 1 TABLET BY MOUTH  DAILY (Patient taking  differently: Take 40 mg by mouth at bedtime. ), Disp: 90 tablet, Rfl: 1 .  atropine 1 % ophthalmic solution, Place 1 drop into the left eye 3 (three) times daily as needed (eye pain)., Disp: , Rfl:  .  cetirizine (ZYRTEC) 10 MG tablet, Take 10 mg by mouth daily., Disp: , Rfl:  .  hydrochlorothiazide (HYDRODIURIL) 25 MG tablet, Take 1 tablet (25 mg total) by mouth daily., Disp: 90 tablet, Rfl: 1 .  insulin degludec (TRESIBA FLEXTOUCH) 200 UNIT/ML FlexTouch Pen, Inject 50 Units into the skin daily., Disp: , Rfl:  .  insulin lispro (HUMALOG) 100 UNIT/ML injection, Inject 6 Units into the skin daily before supper. , Disp: , Rfl:  .  Insulin Pen Needle (B-D UF III MINI PEN NEEDLES) 31G X 5 MM MISC, USE 4 PENNEEDLES DAILY TO  INJECT INSULIN Dx E11.9, Disp: 400 each, Rfl: 3 .  Lancets (ONETOUCH DELICA PLUS [id number]) MISC, Test BS BID Dx E11.9, Disp: 200 each, Rfl: 3 .  latanoprost (XALATAN) 0.005 % ophthalmic solution, Place 1 drop into the left eye at bedtime. , Disp: , Rfl:  .  lisinopril (ZESTRIL) 40 MG tablet, Take 1 tablet (40 mg total) by mouth daily., Disp: 90 tablet, Rfl: 1 .  metoprolol succinate (TOPROL-XL) 100 MG 24 hr tablet, Take with or immediately following a meal. (  Patient taking differently: Take 100 mg by mouth daily. Take with or immediately following a meal.), Disp: 90 tablet, Rfl: 1 .  ONETOUCH ULTRA test strip, Test BS BID Dx E11.9, Disp: 200 strip, Rfl: 3 .  TRULICITY 1.5 TJ/0.3ES SOPN, INJECT THE CONTENTS OF 1  PEN (1.5MG) INTO THE SKIN  WEEKLY AS DIRECTED ., Disp: 6 mL, Rfl: 3 .  vitamin C (ASCORBIC ACID) 500 MG tablet, Take 500 mg by mouth daily., Disp: , Rfl:  .  vitamin E (VITAMIN E) 1000 UNIT capsule, Take 1,000 Units by mouth daily., Disp: , Rfl:  Social History   Socioeconomic History  . Marital status: Married    Spouse name: Not on file  . Number of children: 2  . Years of education: 32  . Highest education level: 11th grade  Occupational History  . Occupation:  maintenance    Employer: [employer]    Comment: Retired  Tobacco Use  . Smoking status: Former Smoker    Packs/day: 2.00    Years: 20.00    Pack years: 40.00    Types: Cigarettes    Quit date: 07/20/1986    Years since quitting: 33.5  . Smokeless tobacco: Never Used  Vaping Use  . Vaping Use: Never used  Substance and Sexual Activity  . Alcohol use: No    Alcohol/week: 0.0 standard drinks  . Drug use: No  . Sexual activity: Yes  Other Topics Concern  . Not on file  Social History Narrative  . Not on file   Social Determinants of Health   Financial Resource Strain:   . Difficulty of Paying Living Expenses:   Food Insecurity:   . Worried About Charity fundraiser in the Last Year:   . Arboriculturist in the Last Year:   Transportation Needs:   . Film/video editor (Medical):   Marland Kitchen Lack of Transportation (Non-Medical):   Physical Activity:   . Days of Exercise per Week:   . Minutes of Exercise per Session:   Stress:   . Feeling of Stress :   Social Connections:   . Frequency of Communication with Friends and Family:   . Frequency of Social Gatherings with Friends and Family:   . Attends Religious Services:   . Active Member of Clubs or Organizations:   . Attends Archivist Meetings:   Marland Kitchen Marital Status:   Intimate Partner Violence:   . Fear of Current or Ex-Partner:   . Emotionally Abused:   Marland Kitchen Physically Abused:   . Sexually Abused:    Family History  Problem Relation Age of Onset  . Heart disease Mother   . Cancer Mother        breast  . Stroke Father 79  . Hypertension Father   . Early death Sister        infant death  . Heart disease Brother   . Hydrocephalus Brother   . Heart disease Brother   . Diabetes Brother   . Cancer Brother        prostat  . Healthy Son   . Healthy Son     Objective: Office vital signs reviewed. BP 122/68   Pulse 73   Temp 98.1 F (36.7 C) (Temporal)   Resp 20   Ht _0  (1.651 m)   Wt 166 lb 8 oz (75.5 kg)    SpO2 97%   BMI 27.71 kg/m   Physical Examination:  General: Awake, alert, well nourished, No acute distress HEENT:  Normal, sclera white, MMM Cardio: regular rate and rhythm, S1S2 heard, no murmurs appreciated Pulm: clear to auscultation bilaterally, no wheezes, rhonchi or rales; normal work of breathing on room air Extremities: warm, well perfused, No edema, cyanosis or clubbing; +2 pulses bilaterally MSK: slow gait; requires cane for ambulation. Tone fair Neuro: LLE weakness compared to RLE  Assessment/ Plan: 79 y.o. male   1. Paroxysmal A-fib (HCC) Rate and rhythm controlled today.  He will come in for fasting lab in September.  He was given 2 weeks worth of samples of Eliquis today.  We will bring appropriate documentation in two-point [place] in 2 weeks see if perhaps we can get him some financial assistance with this medication. - CMP14+EGFR; Future - CBC; Future - TSH; Future  2. History of embolic stroke - MBT59+RCBU; Future - CBC; Future - TSH; Future - Lipid panel; Future  3. Type 2 diabetes mellitus with stage 3b chronic kidney disease, without long-term current use of insulin (HCC) - Bayer DCA Hb A1c Waived; Future - Lipid panel; Future   No orders of the defined types were placed in this encounter.  No orders of the defined types were placed in this encounter.    [name], DO [place] [phone]

## 2020-02-09 NOTE — Patient Instructions (Signed)
Bring in proof of income and a pharmacy receipt with the cost of the Eliquis on it to [place] in 2 weeks She will work on getting this at a reduced cost for you.

## 2020-02-23 ENCOUNTER — Telehealth: Payer: Self-pay | Admitting: Endocrinology

## 2020-02-23 ENCOUNTER — Telehealth: Payer: Self-pay | Admitting: Pharmacist

## 2020-02-23 ENCOUNTER — Other Ambulatory Visit: Payer: Self-pay

## 2020-02-23 ENCOUNTER — Ambulatory Visit (INDEPENDENT_AMBULATORY_CARE_PROVIDER_SITE_OTHER): Payer: Medicare Other | Admitting: Pharmacist

## 2020-02-23 DIAGNOSIS — I480 Paroxysmal atrial fibrillation: Secondary | ICD-10-CM | POA: Diagnosis not present

## 2020-02-23 DIAGNOSIS — E119 Type 2 diabetes mellitus without complications: Secondary | ICD-10-CM | POA: Diagnosis not present

## 2020-02-23 NOTE — Progress Notes (Signed)
     02/23/2020 Name: Evan Stanley MRN: [id number] DOB: 1940/11/21   S:  17 yoM presents for diabetes evaluation, education, and management Patient was referred and last seen by Primary Care Provider on 02/09/20.  Insurance coverage/medication affordability: U[employer] medicare  Patient reports adherence with medications. . Current diabetes medications include: tresiba, trulicity, humalog . Current hypertension medications include: lisinopril, metoprolol, hctz Goal 130/80 . Current hyperlipidemia medications include: atorvastatin   Patient-reported exercise habits: golf   O:  Lab Results  Component Value Date   HGBA1C 7.4 (H) 12/25/2019    Lipid Panel     Component Value Date/Time   CHOL 89 12/25/2019 0257   CHOL 100 08/23/2017 0850   TRIG 60 12/25/2019 0257   HDL 30 (L) 12/25/2019 0257   HDL 32 (L) 08/23/2017 0850   CHOLHDL 3.0 12/25/2019 0257   VLDL 12 12/25/2019 0257   LDLCALC 47 12/25/2019 0257   LDLCALC 49 08/23/2017 0850   LDLDIRECT 62 02/16/2017 1151     A/P:  Diabetes T2DM, A1c currently 7.4%.    -Continue current DM regimen as above.  Patient is able to obtain medications via patient assistance programs  -Patient is currently on Eliquis for Afib.  Tolerating well.  Denies s/sx of bleeding  -application submitted to [employer] patient assistance program; patient will qualify once out of pocket is spent; will continue to follow  -Extensively discussed pathophysiology of diabetes, recommended lifestyle interventions, dietary effects on blood sugar control  -Counseled on s/sx of and management of hypoglycemia  -Next A1C anticipated 3-6 months.   Written patient instructions provided.  Total time in face to face counseling 30 minutes.   Follow up PCP Clinic Visit ON 04/25/20.   Regina Eck, PharmD, BCPS Clinical Pharmacist, [place]  [facility] Phone [phone]

## 2020-02-23 NOTE — Telephone Encounter (Signed)
[employer] Rx form has been completed and placed on Dr. Ronnie Derby desk for him to review and to sign. Once signed and returned, this will be faxed to [facility].

## 2020-02-23 NOTE — Telephone Encounter (Signed)
Patient called stating she needed approval from Dr Dwyane Dee for patient to be taking the 1.5 MG of Trulicity for [employer]?  Ph# [phone] Fax# [phone]

## 2020-03-07 ENCOUNTER — Ambulatory Visit (INDEPENDENT_AMBULATORY_CARE_PROVIDER_SITE_OTHER): Payer: Medicare Other | Admitting: Family Medicine

## 2020-03-07 ENCOUNTER — Encounter: Payer: Self-pay | Admitting: Family Medicine

## 2020-03-07 ENCOUNTER — Other Ambulatory Visit: Payer: Self-pay

## 2020-03-07 ENCOUNTER — Telehealth: Payer: Self-pay | Admitting: Family Medicine

## 2020-03-07 VITALS — BP 122/67 | HR 91 | Temp 98.1°F | Ht 65.0 in | Wt 165.4 lb

## 2020-03-07 DIAGNOSIS — S161XXA Strain of muscle, fascia and tendon at neck level, initial encounter: Secondary | ICD-10-CM

## 2020-03-07 MED ORDER — BACLOFEN 10 MG PO TABS: 10.0000 mg | ORAL_TABLET | Freq: Three times a day (TID) | ORAL | 0 refills | Status: DC

## 2020-03-07 MED ORDER — DICLOFENAC SODIUM 1 % EX GEL: 2.0000 g | Freq: Four times a day (QID) | CUTANEOUS | 3 refills | Status: DC

## 2020-03-07 NOTE — Telephone Encounter (Signed)
Pt believes he has a crick in his neck because he is experiencing neck pain. Wants advise on what he can do to help relieve the pain.

## 2020-03-07 NOTE — Progress Notes (Signed)
BP 122/67   Pulse 91   Temp 98.1 F (36.7 C) (Temporal)   Ht 5' 5"  (1.651 m)   Wt 165 lb 6.4 oz (75 kg)   BMI 27.52 kg/m    Subjective:   Patient ID: Evan Stanley, male    DOB: 07/02/1941, 79 y.o.   MRN: [id number]  HPI: Evan Stanley is a 79 y.o. male presenting on 03/07/2020 for Neck Pain (right side, fell Monday, lost balance playing golf)   HPI Patient is coming in today for right-sided neck pain.  He says he was golfing 4 days ago when he went through with his swing off the TV he lost his balance and fell onto the front of the right side of his neck and face and shoulder on that side.  Now he has pain on the right side of his neck especially when he moves or twists or turns his head.  He denies any numbness or shooting pain anywhere else.  He has been using a little bit of Tylenol but nothing else to this point.  Patient denies any weakness or loss of grip strength.  Relevant past medical, surgical, family and social history reviewed and updated as indicated. Interim medical history since our last visit reviewed. Allergies and medications reviewed and updated.  Review of Systems  Constitutional: Negative for chills and fever.  Respiratory: Negative for shortness of breath and wheezing.   Cardiovascular: Negative for chest pain and leg swelling.  Musculoskeletal: Positive for myalgias and neck pain. Negative for back pain and gait problem.  Skin: Negative for rash.  All other systems reviewed and are negative.   Per HPI unless specifically indicated above   Allergies as of 03/07/2020   No Known Allergies     Medication List       Accurate as of March 07, 2020 11:54 AM. If you have any questions, ask your nurse or doctor.        apixaban 5 MG Tabs tablet Commonly known as: ELIQUIS Take 1 tablet (5 mg total) by mouth 2 (two) times daily.   atorvastatin 40 MG tablet Commonly known as: LIPITOR TAKE 1 TABLET BY MOUTH  DAILY What changed: when to take this     atropine 1 % ophthalmic solution Place 1 drop into the left eye 3 (three) times daily as needed (eye pain).   B-D UF III MINI PEN NEEDLES 31G X 5 MM Misc Generic drug: Insulin Pen Needle USE 4 PENNEEDLES DAILY TO  INJECT INSULIN Dx E11.9   cetirizine 10 MG tablet Commonly known as: ZYRTEC Take 10 mg by mouth daily.   hydrochlorothiazide 25 MG tablet Commonly known as: HYDRODIURIL Take 1 tablet (25 mg total) by mouth daily.   insulin lispro 100 UNIT/ML injection Commonly known as: HUMALOG Inject 6 Units into the skin daily before supper.   latanoprost 0.005 % ophthalmic solution Commonly known as: XALATAN Place 1 drop into the left eye at bedtime.   lisinopril 40 MG tablet Commonly known as: ZESTRIL Take 1 tablet (40 mg total) by mouth daily.   metoprolol succinate 100 MG 24 hr tablet Commonly known as: TOPROL-XL Take with or immediately following a meal. What changed:   how much to take  how to take this  when to take this   OneTouch Delica Plus [id number] Misc Test BS BID Dx E11.9   OneTouch Ultra test strip Generic drug: glucose blood Test BS BID Dx E11.9   [name] FlexTouch 200 UNIT/ML FlexTouch Pen  Generic drug: insulin degludec Inject 50 Units into the skin daily.   Trulicity 1.5 GE/3.6OQ Sopn Generic drug: Dulaglutide INJECT THE CONTENTS OF 1  PEN (1.5MG) INTO THE SKIN  WEEKLY AS DIRECTED .   vitamin C 500 MG tablet Commonly known as: ASCORBIC ACID Take 500 mg by mouth daily.   vitamin E 1000 UNIT capsule Generic drug: vitamin E Take 1,000 Units by mouth daily.        Objective:   BP 122/67   Pulse 91   Temp 98.1 F (36.7 C) (Temporal)   Ht 5' 5"  (1.651 m)   Wt 165 lb 6.4 oz (75 kg)   BMI 27.52 kg/m   Wt Readings from Last 3 Encounters:  03/07/20 165 lb 6.4 oz (75 kg)  02/09/20 166 lb 8 oz (75.5 kg)  01/31/20 166 lb 3.2 oz (75.4 kg)    Physical Exam Vitals and nursing note reviewed.  Constitutional:      General: He is not in  acute distress.    Appearance: He is well-developed. He is not diaphoretic.  Eyes:     General: No scleral icterus.    Conjunctiva/sclera: Conjunctivae normal.  Neck:     Thyroid: No thyromegaly.   Musculoskeletal:        General: Normal range of motion.     Cervical back: Pain with movement and muscular tenderness present.  Skin:    General: Skin is warm and dry.     Findings: No rash.  Neurological:     Mental Status: He is alert and oriented to person, place, and time.     Coordination: Coordination normal.  Psychiatric:        Behavior: Behavior normal.       Assessment & Plan:   Problem List Items Addressed This Visit    None    Visit Diagnoses    Neck strain, initial encounter    -  Primary   Relevant Medications   baclofen (LIORESAL) 10 MG tablet   diclofenac Sodium (VOLTAREN) 1 % GEL      Right-sided neck muscle strain, recommended heating pad.,  If not improved then may send to physical therapy.  Occurred 4 days ago. Follow up plan: Return if symptoms worsen or fail to improve.  Counseling provided for all of the vaccine components No orders of the defined types were placed in this encounter.   Evan Pina, MD [facility] 03/07/2020, 11:54 AM

## 2020-03-07 NOTE — Telephone Encounter (Signed)
Left message for pt to return call.

## 2020-03-08 NOTE — Telephone Encounter (Signed)
Pt was seen yesterday and given medication.

## 2020-03-11 ENCOUNTER — Emergency Department (HOSPITAL_COMMUNITY)
Admission: EM | Admit: 2020-03-11 | Discharge: 2020-03-12 | Disposition: A | Discharge status: Home / Self Care | Payer: Medicare Other | Attending: Emergency Medicine | Admitting: Emergency Medicine

## 2020-03-11 ENCOUNTER — Emergency Department (HOSPITAL_COMMUNITY): Payer: Medicare Other

## 2020-03-11 ENCOUNTER — Other Ambulatory Visit: Payer: Self-pay

## 2020-03-11 ENCOUNTER — Encounter (HOSPITAL_COMMUNITY): Payer: Self-pay | Admitting: Emergency Medicine

## 2020-03-11 DIAGNOSIS — N183 Chronic kidney disease, stage 3 unspecified: Secondary | ICD-10-CM | POA: Diagnosis not present

## 2020-03-11 DIAGNOSIS — I2510 Atherosclerotic heart disease of native coronary artery without angina pectoris: Secondary | ICD-10-CM | POA: Diagnosis not present

## 2020-03-11 DIAGNOSIS — G9389 Other specified disorders of brain: Secondary | ICD-10-CM | POA: Diagnosis not present

## 2020-03-11 DIAGNOSIS — R2689 Other abnormalities of gait and mobility: Secondary | ICD-10-CM | POA: Insufficient documentation

## 2020-03-11 DIAGNOSIS — I6501 Occlusion and stenosis of right vertebral artery: Secondary | ICD-10-CM | POA: Diagnosis not present

## 2020-03-11 DIAGNOSIS — G319 Degenerative disease of nervous system, unspecified: Secondary | ICD-10-CM | POA: Diagnosis not present

## 2020-03-11 DIAGNOSIS — R69 Illness, unspecified: Secondary | ICD-10-CM

## 2020-03-11 DIAGNOSIS — Z87891 Personal history of nicotine dependence: Secondary | ICD-10-CM | POA: Diagnosis not present

## 2020-03-11 DIAGNOSIS — R4781 Slurred speech: Secondary | ICD-10-CM

## 2020-03-11 DIAGNOSIS — R269 Unspecified abnormalities of gait and mobility: Secondary | ICD-10-CM

## 2020-03-11 DIAGNOSIS — I509 Heart failure, unspecified: Secondary | ICD-10-CM | POA: Diagnosis not present

## 2020-03-11 DIAGNOSIS — Z794 Long term (current) use of insulin: Secondary | ICD-10-CM | POA: Diagnosis not present

## 2020-03-11 DIAGNOSIS — I6782 Cerebral ischemia: Secondary | ICD-10-CM | POA: Diagnosis not present

## 2020-03-11 DIAGNOSIS — Z79899 Other long term (current) drug therapy: Secondary | ICD-10-CM | POA: Diagnosis not present

## 2020-03-11 DIAGNOSIS — E1169 Type 2 diabetes mellitus with other specified complication: Secondary | ICD-10-CM | POA: Insufficient documentation

## 2020-03-11 DIAGNOSIS — I639 Cerebral infarction, unspecified: Secondary | ICD-10-CM | POA: Diagnosis not present

## 2020-03-11 DIAGNOSIS — Z7901 Long term (current) use of anticoagulants: Secondary | ICD-10-CM | POA: Insufficient documentation

## 2020-03-11 DIAGNOSIS — I6523 Occlusion and stenosis of bilateral carotid arteries: Secondary | ICD-10-CM | POA: Diagnosis not present

## 2020-03-11 DIAGNOSIS — I130 Hypertensive heart and chronic kidney disease with heart failure and stage 1 through stage 4 chronic kidney disease, or unspecified chronic kidney disease: Secondary | ICD-10-CM | POA: Diagnosis not present

## 2020-03-11 DIAGNOSIS — I7090 Unspecified atherosclerosis: Secondary | ICD-10-CM | POA: Diagnosis not present

## 2020-03-11 LAB — COMPREHENSIVE METABOLIC PANEL
ALT: 32 U/L (ref 0–44)
AST: 22 U/L (ref 15–41)
Albumin: 3.7 g/dL (ref 3.5–5.0)
Alkaline Phosphatase: 70 U/L (ref 38–126)
Anion gap: 11 (ref 5–15)
BUN: 45 mg/dL — ABNORMAL HIGH (ref 8–23)
CO2: 26 mmol/L (ref 22–32)
Calcium: 9.4 mg/dL (ref 8.9–10.3)
Chloride: 103 mmol/L (ref 98–111)
Creatinine, Ser: 2.65 mg/dL — ABNORMAL HIGH (ref 0.61–1.24)
GFR calc Af Amer: 26 mL/min — ABNORMAL LOW (ref >60)
GFR calc non Af Amer: 22 mL/min — ABNORMAL LOW (ref >60)
Glucose, Bld: 153 mg/dL — ABNORMAL HIGH (ref 70–99)
Potassium: 3.8 mmol/L (ref 3.5–5.1)
Sodium: 140 mmol/L (ref 135–145)
Total Bilirubin: 0.4 mg/dL (ref 0.3–1.2)
Total Protein: 6.3 g/dL — ABNORMAL LOW (ref 6.5–8.1)

## 2020-03-11 LAB — ETHANOL: Alcohol, Ethyl (B): <10 mg/dL (ref <10)

## 2020-03-11 LAB — I-STAT CHEM 8, ED
BUN: 45 mg/dL — ABNORMAL HIGH (ref 8–23)
Calcium, Ion: 1.24 mmol/L (ref 1.15–1.40)
Chloride: 102 mmol/L (ref 98–111)
Creatinine, Ser: 2.6 mg/dL — ABNORMAL HIGH (ref 0.61–1.24)
Glucose, Bld: 147 mg/dL — ABNORMAL HIGH (ref 70–99)
HCT: 46 % (ref 39.0–52.0)
Hemoglobin: 15.6 g/dL (ref 13.0–17.0)
Potassium: 3.6 mmol/L (ref 3.5–5.1)
Sodium: 141 mmol/L (ref 135–145)
TCO2: 26 mmol/L (ref 22–32)

## 2020-03-11 LAB — DIFFERENTIAL
Abs Immature Granulocytes: 0.02 10*3/uL (ref 0.00–0.07)
Basophils Absolute: 0.1 10*3/uL (ref 0.0–0.1)
Basophils Relative: 1 %
Eosinophils Absolute: 0.2 10*3/uL (ref 0.0–0.5)
Eosinophils Relative: 3 %
Immature Granulocytes: 0 %
Lymphocytes Relative: 21 %
Lymphs Abs: 1.8 10*3/uL (ref 0.7–4.0)
Monocytes Absolute: 1.1 10*3/uL — ABNORMAL HIGH (ref 0.1–1.0)
Monocytes Relative: 13 %
Neutro Abs: 5.2 10*3/uL (ref 1.7–7.7)
Neutrophils Relative %: 62 %

## 2020-03-11 LAB — RAPID URINE DRUG SCREEN, HOSP PERFORMED
Amphetamines: NOT DETECTED
Barbiturates: NOT DETECTED
Benzodiazepines: NOT DETECTED
Cocaine: NOT DETECTED
Opiates: NOT DETECTED
Tetrahydrocannabinol: NOT DETECTED

## 2020-03-11 LAB — URINALYSIS, ROUTINE W REFLEX MICROSCOPIC
Bilirubin Urine: NEGATIVE
Glucose, UA: NEGATIVE mg/dL
Hgb urine dipstick: NEGATIVE
Ketones, ur: NEGATIVE mg/dL
Leukocytes,Ua: NEGATIVE
Nitrite: NEGATIVE
Protein, ur: NEGATIVE mg/dL
Specific Gravity, Urine: 1.02 (ref 1.005–1.030)
pH: 5 (ref 5.0–8.0)

## 2020-03-11 LAB — APTT: aPTT: 31 seconds (ref 24–36)

## 2020-03-11 LAB — CBC
HCT: 48.1 % (ref 39.0–52.0)
Hemoglobin: 15.2 g/dL (ref 13.0–17.0)
MCH: 26.7 pg (ref 26.0–34.0)
MCHC: 31.6 g/dL (ref 30.0–36.0)
MCV: 84.5 fL (ref 80.0–100.0)
Platelets: 179 10*3/uL (ref 150–400)
RBC: 5.69 MIL/uL (ref 4.22–5.81)
RDW: 14.4 % (ref 11.5–15.5)
WBC: 8.4 10*3/uL (ref 4.0–10.5)
nRBC: 0 % (ref 0.0–0.2)

## 2020-03-11 LAB — PROTIME-INR
INR: 1.4 — ABNORMAL HIGH (ref 0.8–1.2)
Prothrombin Time: 16.2 seconds — ABNORMAL HIGH (ref 11.4–15.2)

## 2020-03-11 MED ORDER — SODIUM CHLORIDE 0.9% FLUSH
3.0000 mL | Freq: Once | INTRAVENOUS | Status: DC
Start: 2020-03-11 — End: 2020-03-12

## 2020-03-11 MED ORDER — IOHEXOL 350 MG/ML SOLN
75.0000 mL | Freq: Once | INTRAVENOUS | Status: AC | PRN
  Administered 2020-03-11: 75 mL via INTRAVENOUS

## 2020-03-11 NOTE — Progress Notes (Signed)
CARDIOLOGY OFFICE NOTE  Date:  03/18/2020    Evan Stanley Date of Birth: [birth year] Medical Record #[id number]  PCP:  [name], DO  Cardiologist:  [name]  Chief Complaint  Patient presents with  . Follow-up    Seen for Dr. [name]    History of Present Illness: Evan Stanley is a 79 y.o. male who presents today for a 4 month check. Seen for Dr. [name].   He has a hx of bioprosthetic aortic valve replacement from 2010,HLD, CAD with prior CABG 2010, left ventricular systolic dysfunction, right cerebral CVA (2017), total occlusion left ICA,prior RCEA,and DM type II.  He is followed by Dr. [name] for PAD due to lower extremity pain and found to have an ABI of 0.78 on the right and 1.14 on the left. Duplex showed short occlusion of the right distal SFA with occluded posterior tibial artery.On the left, there was moderate SFA disease with occluded anterior tibial artery. Revascularization was not recommended at that time however could be considered if symptoms worsen in the future. Per Dr. [name], given the presence of CAD and PAD - we should consider adding a small dose Xarelto.  Last seen by Dr. [name] in May of 2020.   He was seen by Kathyrn Drown NP this past May and was continuing to do well. Follows with [facility] for CKD. Was to come back in one year.   Comes in today. Here with his wife. She augments the history but it is still somewhat hard to follow. He had a stroke back in early June - was told to come back here and says that they were not able to get an appointment until today. Presented after falling multiple times - diagnosed with stroke. AF noted to be documented in the chart apparently in 2020 and 2017 "per review of records" but no EKG to confirm - however, with the embolic pattern on imaging - suspected PAF - and hence Eliquis was started. Cardiology recommended starting this as well.  He recovered - back playing golf. She took him back to the ER last week -  she was worried he was having another stroke but sounds like adverse effect from Baclofen instead. Had been given this for a pulled muscle - had had a fall about a week prior while on the golf course. Balance is poor. His ACE was recently cut back for what sounds like low BP.   Past Medical History:  Diagnosis Date  . AKI (acute kidney injury) ([place])   . Allergy   . Anxiety   . Arthritis    Right shoulder  . Atrial fibrillation ([place])   . Carotid artery stenosis   . Cataract   . CHF (congestive heart failure) ([place])   . CVA (cerebral vascular accident) ([place]) 04/28/2016  . Decreased vision    left eye  . Diabetes mellitus without complication ([place])    Takes Metformin  . Hyperlipidemia   . Hypertension   . Salmonella bacteremia 04/29/2016  . Sepsis ([place]) 04/29/2016  . Stroke ([place]) 11/18/2015   no deficits  . TIA (transient ischemic attack)    affected left eye  . Urgency of urination     Past Surgical History:  Procedure Laterality Date  . AORTIC VALVE REPLACEMENT    . BACK SURGERY  80s  . CARDIAC VALVE REPLACEMENT  11/01/2008   21-mm Edwards Pericardial Magna - Ease valve  . ENDARTERECTOMY Right 01/13/2016   Procedure: ENDARTERECTOMY CAROTID - RIGHT;  Surgeon: Conrad Quartz Hill, MD;  Location: [facility];  Service: Vascular;  Laterality: Right;  . EYE SURGERY    . laser surgery, left eye  Left 10-29-2015    retinal surgery/ Deloria Lair MD   . PERIPHERAL VASCULAR CATHETERIZATION Right 11/18/2015   Procedure: Carotid Angiography;  Surgeon: Conrad Steuben, MD;  Location: [facility];  Service: Cardiovascular;  Laterality: Right;  . PERIPHERAL VASCULAR CATHETERIZATION N/A 11/18/2015   Procedure: Aortic Arch Angiography;  Surgeon: Conrad Nambe, MD;  Location: [facility];  Service: Cardiovascular;  Laterality: N/A;  . TEE WITHOUT CARDIOVERSION N/A 05/05/2016   Procedure: TRANSESOPHAGEAL ECHOCARDIOGRAM (TEE);  Surgeon: Lelon Perla, MD;  Location: [facility] ENDOSCOPY;  Service:  Cardiovascular;  Laterality: N/A;     Medications: Current Meds  Medication Sig  . apixaban (ELIQUIS) 5 MG TABS tablet Take 1 tablet (5 mg total) by mouth 2 (two) times daily.  Marland Kitchen atorvastatin (LIPITOR) 40 MG tablet TAKE 1 TABLET BY MOUTH  DAILY  . atropine 1 % ophthalmic solution Place 1 drop into the left eye 3 (three) times daily as needed (eye pain).  . cetirizine (ZYRTEC) 10 MG tablet Take 10 mg by mouth daily.  . diclofenac Sodium (VOLTAREN) 1 % GEL Apply 2 g topically 4 (four) times daily.  . hydrochlorothiazide (HYDRODIURIL) 25 MG tablet Take 1 tablet (25 mg total) by mouth daily.  . insulin degludec (TRESIBA FLEXTOUCH) 200 UNIT/ML FlexTouch Pen Inject 50 Units into the skin daily.  . insulin lispro (HUMALOG) 100 UNIT/ML injection Inject 6 Units into the skin daily before supper.   . Insulin Pen Needle (B-D UF III MINI PEN NEEDLES) 31G X 5 MM MISC USE 4 PENNEEDLES DAILY TO  INJECT INSULIN Dx E11.9  . Lancets (ONETOUCH DELICA PLUS [id number]) MISC Test BS BID Dx E11.9  . latanoprost (XALATAN) 0.005 % ophthalmic solution Place 1 drop into the left eye at bedtime.   Marland Kitchen lisinopril (ZESTRIL) 20 MG tablet Take 1 tablet (20 mg total) by mouth daily.  . metoprolol succinate (TOPROL-XL) 100 MG 24 hr tablet Take with or immediately following a meal.  . ONETOUCH ULTRA test strip Test BS BID Dx E11.9  . TRULICITY 1.5 LS/9.3TD SOPN INJECT THE CONTENTS OF 1  PEN (1.5MG) INTO THE SKIN  WEEKLY AS DIRECTED .  . vitamin C (ASCORBIC ACID) 500 MG tablet Take 500 mg by mouth daily.  . vitamin E (VITAMIN E) 1000 UNIT capsule Take 1,000 Units by mouth daily.     Allergies: Allergies  Allergen Reactions  . Baclofen Other (See Comments)    Stroke like symptoms    Social History: The patient  reports that he quit smoking about 33 years ago. His smoking use included cigarettes. He has a 40.00 pack-year smoking history. He has never used smokeless tobacco. He reports that he does not drink alcohol and  does not use drugs.   Family History: The patient's family history includes Cancer in his brother and mother; Diabetes in his brother; Early death in his sister; Healthy in his son and son; Heart disease in his brother, brother, and mother; Hydrocephalus in his brother; Hypertension in his father; Stroke (age of onset: 84) in his father.   Review of Systems: Please see the history of present illness.   All other systems are reviewed and negative.   Physical Exam: VS:  BP 112/78   Pulse 84   Ht 5' 5"  (1.651 m)   Wt 166 lb 12.8 oz (  75.7 kg)   SpO2 96%   BMI 27.76 kg/m  .  BMI Body mass index is 27.76 kg/m.  Wt Readings from Last 3 Encounters:  03/18/20 166 lb 12.8 oz (75.7 kg)  03/13/20 164 lb (74.4 kg)  03/07/20 165 lb 6.4 oz (75 kg)    General: Alert and in no acute distress.   Cardiac: Regular rate and rhythm. Heart tones are distant.  No edema.  Respiratory:  Lungs are clear to auscultation bilaterally with normal work of breathing.  GI: Soft and nontender.  MS: No deformity or atrophy. Gait and ROM intact.  Skin: Warm and dry. Color is normal.  Neuro:  Strength and sensation are intact and no gross focal deficits noted.  Psych: Alert, appropriate and with normal affect.   LABORATORY DATA:  EKG:  EKG is not ordered today. EKG from 03/12/2020 with NSR noted.   Lab Results  Component Value Date   WBC 8.4 03/11/2020   HGB 15.6 03/11/2020   HCT 46.0 03/11/2020   PLT 179 03/11/2020   GLUCOSE 147 (H) 03/11/2020   CHOL 89 12/25/2019   TRIG 60 12/25/2019   HDL 30 (L) 12/25/2019   LDLDIRECT 62 02/16/2017   LDLCALC 47 12/25/2019   ALT 32 03/11/2020   AST 22 03/11/2020   NA 141 03/11/2020   K 3.6 03/11/2020   CL 102 03/11/2020   CREATININE 2.60 (H) 03/11/2020   BUN 45 (H) 03/11/2020   CO2 26 03/11/2020   TSH 1.93 09/06/2019   PSA 0.9 06/14/2013   INR 1.4 (H) 03/11/2020   HGBA1C 7.4 (H) 12/25/2019   MICROALBUR 5.1 (H) 09/06/2019     BNP (last 3 results) No  results for input(s): BNP in the last 8760 hours.  ProBNP (last 3 results) No results for input(s): PROBNP in the last 8760 hours.   Other Studies Reviewed Today:  ECHO IMPRESSIONS 12/2019  1. Left ventricular ejection fraction, by estimation, is 65 to 70%. The  left ventricle has normal function. The left ventricle has no regional  wall motion abnormalities. There is moderate left ventricular hypertrophy.  Left ventricular diastolic  parameters are consistent with Grade I diastolic dysfunction (impaired  relaxation). Elevated left ventricular end-diastolic pressure.  2. Right ventricular systolic function is low normal. The right  ventricular size is normal.  3. The mitral valve is abnormal. Trivial mitral valve regurgitation.  4. The aortic valve has been repaired/replaced with a bioprosthetic  valve. Aortic valve regurgitation is not visualized. Aortic valve area, by  VTI measures 2.69 cm. Aortic valve mean gradient measures 8.0 mmHg. Peak  gradient is 13.5 mmHg. Aortic valve  Vmax measures 1.84 m/s.     ASSESSMENT AND PLAN:  1. Prior stroke from June - embolic - felt to be due to AF - now on anticoagulation. Seems to have some personality issues. Balance remains an issue.   2. Known CAD with prior CABG/AVR from 2010 - updated echo noted. He is now off aspirin and continues with beta blocker and statin therapy.   3. HTN - BP ok - sounds like ACE has been cut back - will give BP cuff so they can monitor at home.   4. DM  5. Worsening CKD - sounds like he has seen [facility] in the past - she is not sure who this is - recheck BMET today to confirm worsening disease.   6. Chronic anticoagulation - now on Eliquis. Balance will be an issue and hence more falls.   7.  PAF - in sinus by exam and by EKG from a few days ago.   8. HLD - on statin  9. PVD - has seen Dr. [name] - he is managed medically with no plans for revascularization planned.    Current medicines are  reviewed with the patient today.  The patient does not have concerns regarding medicines other than what has been noted above.  The following changes have been made:  See above.  Labs/ tests ordered today include:    Orders Placed This Encounter  Procedures  . Basic metabolic panel     Disposition:   FU with Dr. [name] in 3 to 4 months.    Patient is agreeable to this plan and will call if any problems develop in the interim.   SignedTruitt Merle, NP  03/18/2020 10:39 AM  [facility] [address] [place], [place]  [address] Phone: [phone] Fax: [phone]

## 2020-03-11 NOTE — ED Triage Notes (Signed)
Pt presents to ED POV. Pt c/o ataxia, slurred speech, L facial droop w/ speech. LKW 1930 03/11/2020. Pt reports that he had hx stroke un June w/ similar symptoms

## 2020-03-11 NOTE — Consult Note (Addendum)
Neurology Consultation  Reason for Consult: code stroke for difficutly walking, imbalance, generalized weakness along with left facial droop, slurred speech Referring Physician: Dr. Vallery Ridge, ED provider  CC: Difficulty walking, imbalance, generalized weakness along with left facial droop  History is obtained from: Chart and patient's wife  HPI: Evan Stanley is a 79 y.o. 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  Wife also noticed that left face was drooping at that time.  This was not like his normal self, he was helped by unable to get out of the car because he himself could not get out of the car. Wife decided to drive him to the emergency room to get him checked up.  He was seen at the triage and a code stroke was activated.  From the time the ED provider evaluated him and a code stroke page went out was a difference of about 20 minutes. I was called by the CT technologist super scanning him for code stroke and notified of his arrival to the [facility].  I immediately arrived at the [facility] and examined him there. He was able to provide me history. His speech was mildly dysarthric but he was able to tell me that he was feeling somewhat out of sorts.  He described generalized weakness did not really say was weak on one side or the  other.  He did complain of left shoulder weakness due to pain.  He said his wife told him that his left face was drooping but he could not really tell whether it was drooping or not.  He did feel that his speech was more dysarthric than at baseline.  No seizure-like activity.  No shaking.  No loss of consciousness.  He had followed up with outpatient neurology clinic in July 2021 after his stroke work-up discharge.  Started on Eliquis.  Last dose this morning.  On the outpatient visit, there was some report of occasional abnormal movements of the right upper extremity as if he is unable to control lasting 4 to 10 minutes which the wife had noticed had increased more so since his recent stroke in June.  No such episodes reported today.   LKW: 7pm 03/11/2020 tpa given?: no, last dose of Eliquis this morning at 9 AM Premorbid modified Rankin scale (mRS): 2  ROS: Obtained and negative except as noted in HPI.  Past Medical History:  Diagnosis Date  . AKI (acute kidney injury) ([place])   . Allergy   . Anxiety   . Arthritis    Right shoulder  . Atrial fibrillation ([place])   . Carotid artery stenosis   . Cataract   . CHF (congestive heart failure) ([place])   . CVA (cerebral vascular accident) ([place]) 04/28/2016  . Decreased vision    left eye  . Diabetes mellitus without complication ([place])    Takes Metformin  . Hyperlipidemia   . Hypertension   .  Salmonella bacteremia 04/29/2016  . Sepsis ([place]) 04/29/2016  . Stroke ([place]) 11/18/2015   no deficits  . TIA (transient ischemic attack)    affected left eye  . Urgency of urination     Family History  Problem Relation Age of Onset  . Heart disease Mother   . Cancer Mother        breast  . Stroke Father 42  . Hypertension Father   . Early death Sister        infant death  . Heart disease Brother   . Hydrocephalus Brother   . Heart disease Brother   . Diabetes Brother   . Cancer Brother        prostat  . Healthy Son   . Healthy Son       Social History:   reports that he quit smoking about 33 years ago. His smoking use included cigarettes. He has a 40.00 pack-year smoking history. He has never used smokeless tobacco. He reports that he does not drink alcohol and does not use drugs.  Medications  Current Facility-Administered Medications:  .  sodium chloride flush (NS) 0.9 % injection 3 mL, 3 mL, Intravenous, Once, Pfeiffer, Marcy, MD  Current Outpatient Medications:  .  apixaban (ELIQUIS) 5 MG TABS tablet, Take 1 tablet (5 mg total) by mouth 2 (two) times daily., Disp: 60 tablet, Rfl: 3 .  atorvastatin (LIPITOR) 40 MG tablet, TAKE 1 TABLET BY MOUTH  DAILY (Patient taking differently: Take 40 mg by mouth at bedtime. ), Disp: 90 tablet, Rfl: 1 .  atropine 1 % ophthalmic solution, Place 1 drop into the left eye 3 (three) times daily as needed (eye pain)., Disp: , Rfl:  .  baclofen (LIORESAL) 10 MG tablet, Take 1 tablet (10 mg total) by mouth 3 (three) times daily., Disp: 30 each, Rfl: 0 .  cetirizine (ZYRTEC) 10 MG tablet, Take 10 mg by mouth daily., Disp: , Rfl:  .  diclofenac Sodium (VOLTAREN) 1 % GEL, Apply 2 g topically 4 (four) times daily., Disp: 350 g, Rfl: 3 .  hydrochlorothiazide (HYDRODIURIL) 25 MG tablet, Take 1 tablet (25 mg total) by mouth daily., Disp: 90 tablet, Rfl: 1 .  insulin degludec (TRESIBA FLEXTOUCH) 200 UNIT/ML FlexTouch Pen, Inject 50 Units into the skin daily., Disp: , Rfl:  .  insulin lispro (HUMALOG) 100 UNIT/ML injection, Inject 6 Units into the skin daily before supper. , Disp: , Rfl:  .  Insulin Pen Needle (B-D UF III MINI PEN NEEDLES) 31G X 5 MM MISC, USE 4 PENNEEDLES DAILY TO  INJECT INSULIN Dx E11.9, Disp: 400 each, Rfl: 3 .  Lancets (ONETOUCH DELICA PLUS [id number]) MISC, Test BS BID Dx E11.9, Disp: 200 each, Rfl: 3 .  latanoprost (XALATAN) 0.005 % ophthalmic solution, Place 1 drop into the left eye at bedtime. , Disp: , Rfl:  .  lisinopril (ZESTRIL) 40 MG tablet, Take 1 tablet (40 mg  total) by mouth daily., Disp: 90 tablet, Rfl: 1 .  metoprolol succinate (TOPROL-XL) 100 MG 24 hr tablet, Take with or immediately following a meal. (Patient taking differently: Take 100 mg by mouth daily. Take with or immediately following a meal.), Disp: 90 tablet, Rfl: 1 .  ONETOUCH ULTRA test strip, Test BS BID Dx E11.9, Disp: 200 strip, Rfl: 3 .  TRULICITY 1.5 DJ/4.9FW SOPN, INJECT THE CONTENTS OF 1  PEN (1.5MG) INTO THE SKIN  WEEKLY AS DIRECTED ., Disp: 6 mL, Rfl: 3 .  vitamin C (ASCORBIC ACID) 500 MG  tablet, Take 500 mg by mouth daily., Disp: , Rfl:  .  vitamin E (VITAMIN E) 1000 UNIT capsule, Take 1,000 Units by mouth daily., Disp: , Rfl:   Exam: Current vital signs: BP (!) 118/56 (BP Location: Right Arm)   Pulse 70   Temp 98.1 F (36.7 C) (Oral)   Resp 15   SpO2 98%  Vital signs in last 24 hours: Temp:  [98.1 F (36.7 C)] 98.1 F (36.7 C) (08/23 2125) Pulse Rate:  [70] 70 (08/23 2125) Resp:  [15] 15 (08/23 2125) BP: (118)/(56) 118/56 (08/23 2125) SpO2:  [98 %] 98 % (08/23 2125) General: Awake alert in no distress HEENT: Normocephalic, atraumatic CVS: Regular rate rhythm Lungs: Clear Abdomen nondistended nontender Extremities warm well perfused with intact pulses Neurological exam He is awake alert oriented x3 No evidence of aphasia His speech is mildly dysarthric Cranial nerves: Pupils equal round reactive to light, extraocular movements intact, visual fields are full, facial sensations intact, face appears mildly asymmetric with subtle left angle of the mouth drooping but on laughing and smiling appears symmetric, shoulder shrug intact, tongue and palate midline. Motor exam: He has no vertical drift in any of the 4 extremities but has some pain and decreased range of motion in the left shoulder which he says is because of his left shoulder arthritis. Sensory exam: Intact to touch without extinction Coordination: Intact finger-nose-finger but mildly deranged left  heel-knee-shin. Gait testing deferred at this time for his safety and comfort-wife reported him to be grossly wobbly.   NIH stroke scale-3.  (1 for facial, 1 for dysarthria, 1 for ataxia)   Labs I have reviewed labs in epic and the results pertinent to this consultation are:  CBC    Component Value Date/Time   WBC 8.4 03/11/2020 2203   RBC 5.69 03/11/2020 2203   HGB 15.6 03/11/2020 2208   HGB 14.9 01/04/2020 0855   HCT 46.0 03/11/2020 2208   HCT 46.1 01/04/2020 0855   PLT 179 03/11/2020 2203   PLT 230 01/04/2020 0855   MCV 84.5 03/11/2020 2203   MCV 83 01/04/2020 0855   MCH 26.7 03/11/2020 2203   MCHC 31.6 03/11/2020 2203   RDW 14.4 03/11/2020 2203   RDW 13.2 01/04/2020 0855   LYMPHSABS 1.8 03/11/2020 2203   LYMPHSABS 1.4 01/04/2020 0855   MONOABS 1.1 (H) 03/11/2020 2203   EOSABS 0.2 03/11/2020 2203   EOSABS 0.2 01/04/2020 0855   BASOSABS 0.1 03/11/2020 2203   BASOSABS 0.1 01/04/2020 0855    CMP     Component Value Date/Time   NA 141 03/11/2020 2208   NA 147 (H) 01/18/2020 1150   K 3.6 03/11/2020 2208   CL 102 03/11/2020 2208   CO2 26 03/11/2020 2203   GLUCOSE 147 (H) 03/11/2020 2208   BUN 45 (H) 03/11/2020 2208   BUN 28 (H) 01/18/2020 1150   CREATININE 2.60 (H) 03/11/2020 2208   CALCIUM 9.4 03/11/2020 2203   PROT 6.3 (L) 03/11/2020 2203   PROT 6.1 01/18/2020 1150   ALBUMIN 3.7 03/11/2020 2203   ALBUMIN 3.7 01/18/2020 1150   AST 22 03/11/2020 2203   ALT 32 03/11/2020 2203   ALKPHOS 70 03/11/2020 2203   BILITOT 0.4 03/11/2020 2203   BILITOT 0.4 01/18/2020 1150   GFRNONAA 22 (L) 03/11/2020 2203   GFRAA 26 (L) 03/11/2020 2203    Imaging I have reviewed the images obtained:  CT-scan of the brain-no acute changes.  Aspects 10.  No bleed CTA head and  neck: Negative for emergent large vessel occlusion.  Chronic left ICA occlusion at origin with distal reconstitution in the cavernous segment.  Sequela of prior right carotid endarterectomy with wide patency of  the right CCA and ICA.  Severe stenosis versus occlusion involving the proximal right ECA.  Moderate to severe ostial stenosis at the origin of the right vertebral artery with additional moderate to severe multifocal stenosis of the right V4.  The right vertebral artery essentially occludes prior to vertebrobasilar junction.  Dominant left vertebral artery.  Left vertebral artery is patent without stenosis.  50% stenosis involving the mid basilar artery.  Aortic atherosclerosis and emphysema seen.  MRI brain: No acute stroke.  Generalized atrophy, more than expected for age.  Chronic white matter changes.  Assessment:  79 year old with above past medical history presenting with symptoms of slurred speech, generalized weakness, difficulty walking and possible left-sided facial droop. Since his last stroke in June 2021, he has had some left weakness, more so with walking that he has required cane for off-and-on but today was much more weaker and much more difficult to walk without assistance.  The speech also became more slurred than baseline per wife. Takes Eliquis for atrial fibrillation-last dose was this morning hence not a candidate for IV TPA. NIH stroke scale was a total of 3-dysarthria, left facial droop which was mild as well as mild ataxia on left heel-knee-shin testing. Taken for stat CTA head and neck which remained unchanged from prior studies-chronic left ICA occlusion as well as multiple stenosis in the posterior circulation.  Patent right internal carotid post endarterectomy. Given concern for small vessel type stroke versus scattered embolic strokes, taken for stat MRI of the brain which did not reveal any acute strokes. Given negative MRI and unchanged CT of the head and neck-symptoms likely consistent with recrudescence of stroke symptoms. Check for underlying toxic metabolic derangements such as kidney disease versus UTI versus pneumonia.  Recommendations: No need to repeat stroke  work-up-had stroke work-up done in June. Check urinalysis Check chest x-ray Check basic labs-creatinine is above baseline, might need medical management of kidney disease. Continue home Eliquis Continue home statin Plan relayed to Dr. Vallery Ridge in the ER.  Please call with questions.  -- Amie Portland, MD [facility] Pager: [phone] If 7pm to 7am, please call on call as listed on AMION.

## 2020-03-11 NOTE — ED Provider Notes (Signed)
[facility] EMERGENCY DEPARTMENT Provider Note   CSN: [id number] Arrival date & time: 03/11/20  2121  An emergency department physician performed an initial assessment on this suspected stroke patient at 2150.  History Chief Complaint  Patient presents with  . Gait Problem    Evan Stanley is a 79 y.o. male.  HPI Patient had prior history of stroke in June.  Patient's wife reports that he has been ambulating with a cane and driving the car.  He reports this evening they got in the car and ran some errands.  At 730 she got back home and patient could not get out of the car and he seemed much more weak.  She reports his speech was slurred and difficult to understand.  The neighbor assisted him to get out of the car but he was no longer able to walk independently with the use of a cane.  She reports he was extremely unsteady and required extensive assistance.  Patient's wife also noted some droop to the left face.  Patient's wife reports she checked his blood sugar and it was 90.    Past Medical History:  Diagnosis Date  . AKI (acute kidney injury) ([place])   . Allergy   . Anxiety   . Arthritis    Right shoulder  . Atrial fibrillation ([place])   . Carotid artery stenosis   . Cataract   . CHF (congestive heart failure) ([place])   . CVA (cerebral vascular accident) ([place]) 04/28/2016  . Decreased vision    left eye  . Diabetes mellitus without complication ([place])    Takes Metformin  . Hyperlipidemia   . Hypertension   . Salmonella bacteremia 04/29/2016  . Sepsis ([place]) 04/29/2016  . Stroke ([place]) 11/18/2015   no deficits  . TIA (transient ischemic attack)    affected left eye  . Urgency of urination     Patient Active Problem List   Diagnosis Date Noted  . Paroxysmal A-fib ([place]) 01/04/2020  . Gait disorder   . Stroke ([place]) 12/24/2019  . Posterior vitreous detachment of right eye 11/29/2019  . Branch retinal artery occlusion of left eye 11/29/2019  . Rubeosis  iridis of left eye 11/29/2019  . Glaucoma associated with ocular disorder, left, severe stage 11/29/2019  . History of TIA (transient ischemic attack) 05/13/2019  . Coronary arteriosclerosis in native artery 12/12/2018  . Type 2 diabetes mellitus ([place]) 12/12/2018  . Right Carotid artery stenosis 11/03/2017  . Crohn's disease ([place]) 04/30/2016  . Stage 3 chronic kidney disease 04/29/2016  . Late effects of cerebrovascular disease 04/03/2016  . History of aortic valve replacement 01/05/2016  . Left Carotid artery occlusion 11/18/2015  . Disorder of carotid artery ([place]) 11/18/2015  . Occlusion and stenosis of right carotid artery 10/26/2014  . Hyperlipidemia associated w/ type 2 DM goal <70 07/05/2013  . Hypertension associated with diabetes ([place]) 07/05/2013  . Hyperlipidemia, unspecified 07/05/2013    Past Surgical History:  Procedure Laterality Date  . AORTIC VALVE REPLACEMENT    . BACK SURGERY  80s  . CARDIAC VALVE REPLACEMENT  11/01/2008   21-mm Edwards Pericardial Magna - Ease valve  . ENDARTERECTOMY Right 01/13/2016   Procedure: ENDARTERECTOMY CAROTID - RIGHT;  Surgeon: Conrad Archie, MD;  Location: [facility];  Service: Vascular;  Laterality: Right;  . EYE SURGERY    . laser surgery, left eye  Left 10-29-2015    retinal surgery/ Deloria Lair MD   . PERIPHERAL VASCULAR CATHETERIZATION Right 11/18/2015  Procedure: Carotid Angiography;  Surgeon: Conrad [place], MD;  Location: [facility];  Service: Cardiovascular;  Laterality: Right;  . PERIPHERAL VASCULAR CATHETERIZATION N/A 11/18/2015   Procedure: Aortic Arch Angiography;  Surgeon: Conrad [place], MD;  Location: [facility];  Service: Cardiovascular;  Laterality: N/A;  . TEE WITHOUT CARDIOVERSION N/A 05/05/2016   Procedure: TRANSESOPHAGEAL ECHOCARDIOGRAM (TEE);  Surgeon: Lelon Perla, MD;  Location: [facility] ENDOSCOPY;  Service: Cardiovascular;  Laterality: N/A;       Family History  Problem Relation Age of Onset  . Heart  disease Mother   . Cancer Mother        breast  . Stroke Father 37  . Hypertension Father   . Early death Sister        infant death  . Heart disease Brother   . Hydrocephalus Brother   . Heart disease Brother   . Diabetes Brother   . Cancer Brother        prostat  . Healthy Son   . Healthy Son     Social History   Tobacco Use  . Smoking status: Former Smoker    Packs/day: 2.00    Years: 20.00    Pack years: 40.00    Types: Cigarettes    Quit date: 07/20/1986    Years since quitting: 33.6  . Smokeless tobacco: Never Used  Vaping Use  . Vaping Use: Never used  Substance Use Topics  . Alcohol use: No    Alcohol/week: 0.0 standard drinks  . Drug use: No    Home Medications Prior to Admission medications   Medication Sig Start Date End Date Taking? Authorizing Provider  apixaban (ELIQUIS) 5 MG TABS tablet Take 1 tablet (5 mg total) by mouth 2 (two) times daily. 12/27/19   Danford, Suann Larry, MD  atorvastatin (LIPITOR) 40 MG tablet TAKE 1 TABLET BY MOUTH  DAILY Patient taking differently: Take 40 mg by mouth at bedtime.  11/07/19   [name], DO  atropine 1 % ophthalmic solution Place 1 drop into the left eye 3 (three) times daily as needed (eye pain).    [provider]  baclofen (LIORESAL) 10 MG tablet Take 1 tablet (10 mg total) by mouth 3 (three) times daily. 03/07/20   Dettinger, Fransisca Kaufmann, MD  cetirizine (ZYRTEC) 10 MG tablet Take 10 mg by mouth daily.    [provider]  diclofenac Sodium (VOLTAREN) 1 % GEL Apply 2 g topically 4 (four) times daily. 03/07/20   Dettinger, Fransisca Kaufmann, MD  hydrochlorothiazide (HYDRODIURIL) 25 MG tablet Take 1 tablet (25 mg total) by mouth daily. 10/02/19   [name], DO  insulin degludec (TRESIBA FLEXTOUCH) 200 UNIT/ML FlexTouch Pen Inject 50 Units into the skin daily.    [provider]  insulin lispro (HUMALOG) 100 UNIT/ML injection Inject 6 Units into the skin daily before supper.     [provider]  Insulin Pen Needle (B-D UF III MINI PEN NEEDLES) 31G X 5 MM MISC USE 4 PENNEEDLES DAILY TO  INJECT INSULIN Dx E11.9 01/01/20   [name], DO  Lancets (ONETOUCH DELICA PLUS [id number]) MISC Test BS BID Dx E11.9 12/28/19   [name], DO  latanoprost (XALATAN) 0.005 % ophthalmic solution Place 1 drop into the left eye at bedtime.     [provider]  lisinopril (ZESTRIL) 40 MG tablet Take 1 tablet (40 mg total) by mouth daily. 10/02/19   [name], DO  metoprolol succinate (TOPROL-XL) 100 MG 24 hr tablet Take with or immediately following a meal. Patient taking differently: Take 100 mg by mouth daily. Take with or immediately following a meal. 10/02/19   Gottschalk, Leatrice Jewels M, DO  ONETOUCH ULTRA test strip Test BS BID Dx E11.9 12/28/19   [name], DO  TRULICITY 1.5 SH/7.77YO SOPN INJECT THE CONTENTS OF 1  PEN (1.5MG) INTO THE SKIN  WEEKLY AS DIRECTED . 01/05/20   [name], MD  vitamin C (ASCORBIC ACID) 500 MG tablet Take 500 mg by mouth daily.    [provider]  vitamin E (VITAMIN E) 1000 UNIT capsule Take 1,000 Units by mouth daily.    [provider]    Allergies    Patient has no known allergies.  Review of Systems   Review of Systems 10 systems reviewed and negative except as per HPI Physical Exam Updated Vital Signs BP 129/64 (BP Location: Left Arm)   Pulse 81   Temp 97.6 F (36.4 C) (Oral)   Resp 20   SpO2 98%   Physical Exam Constitutional:      Comments: Alert and nontoxic.  No respiratory distress.  HENT:     Head: Normocephalic and atraumatic.     Nose: Nose normal.     Mouth/Throat:     Mouth: Mucous membranes are moist.     Pharynx: Oropharynx is clear.  Eyes:     Extraocular Movements: Extraocular movements intact.     Pupils: Pupils are equal, round, and reactive to light.  Cardiovascular:     Rate and Rhythm: Regular rhythm.  Pulmonary:     Effort: Pulmonary effort is normal.   Abdominal:     Palpations: Abdomen is soft.  Musculoskeletal:        General: Normal range of motion.     Cervical back: Neck supple.  Skin:    General: Skin is warm and dry.  Neurological:     Comments: Patient is alert.  Speech is slightly slurred.  Patient can answer questions giving home address and historical information.  No dense receptive or expressive aphasia.  Mouth a slight droop to the left side.  Patient can hold and extend both upper extremities without drift.  (Patient does have slightly less elevation on the left which she cites a bad shoulder).  Grip strength 5\5 bilaterally patient can go flex and extend against resistance upper extremity symmetric.  Lower extremity left lower extremity can be held in extension but developed some weakness early relative to the right.  Good strength in the right against downward resistance.  Psychiatric:        Mood and Affect: Mood normal.     ED Results / Procedures / Treatments   Labs (all labs ordered are listed, but only abnormal results are displayed) Labs Reviewed  PROTIME-INR - Abnormal; Notable for the following components:      Result Value   Prothrombin Time 16.2 (*)    INR 1.4 (*)    All other components within normal limits  DIFFERENTIAL - Abnormal; Notable for the following components:   Monocytes Absolute 1.1 (*)    All other components within normal limits  COMPREHENSIVE METABOLIC PANEL - Abnormal; Notable for the following components:   Glucose, Bld 153 (*)    BUN 45 (*)    Creatinine, Ser 2.65 (*)    Total Protein 6.3 (*)    GFR calc non Af Amer 22 (*)    GFR calc Af Amer 26 (*)  All other components within normal limits  I-STAT CHEM 8, ED - Abnormal; Notable for the following components:   BUN 45 (*)    Creatinine, Ser 2.60 (*)    Glucose, Bld 147 (*)    All other components within normal limits  APTT  CBC  RAPID URINE DRUG SCREEN, HOSP PERFORMED  URINALYSIS, ROUTINE W REFLEX MICROSCOPIC  ETHANOL   I-STAT CHEM 8, ED  CBG MONITORING, ED    EKG EKG Interpretation  Date/Time:  Monday March 11 2020 23:34:38 EDT Ventricular Rate:  84 PR Interval:    QRS Duration: 93 QT Interval:  354 QTC Calculation: 419 R Axis:   75 Text Interpretation: Normal sinus rhythm RSR' in V1 or V2, right VCD or RVH Nonspecific T abnormalities, diffuse leads Confirmed by Randal Buba, April ([id number]) on 03/11/2020 11:46:47 PM   Radiology CT Code Stroke CTA Head W/WO contrast  Result Date: 03/11/2020 CLINICAL DATA:  Initial evaluation for acute left-sided weakness, dysphagia. EXAM: CT ANGIOGRAPHY HEAD AND NECK TECHNIQUE: Multidetector CT imaging of the head and neck was performed using the standard protocol during bolus administration of intravenous contrast. Multiplanar CT image reconstructions and MIPs were obtained to evaluate the vascular anatomy. Carotid stenosis measurements (when applicable) are obtained utilizing NASCET criteria, using the distal internal carotid diameter as the denominator. CONTRAST:  30m OMNIPAQUE IOHEXOL 350 MG/ML SOLN COMPARISON:  Prior head CT from earlier same day. FINDINGS: CTA NECK FINDINGS Aortic arch: Visualized aortic arch of normal caliber with normal 3 vessel morphology. Moderate to advanced atheromatous change about the aortic arch and origin of the great vessels. Associated 50% atheromatous stenosis at the origin of the left CCA (series 9, image 304). Right carotid system: Scattered atheromatous plaque throughout the right CCA with no more than mild multifocal stenosis. Sequelae of prior right carotid endarterectomy without residual stenosis about the right carotid bifurcation or proximal right ICA. Right ICA patent distally to the skull base without stenosis, dissection or occlusion. Focal high-grade stenosis versus occlusion of the proximal right ECA noted (series 9, image 198). Opacification of the right ECA distally could be related to subocclusive stenosis and/or  collateralization. Left carotid system: 50% atheromatous stenosis at the origin of the left CCA. Additional scattered plaque noted within the left CCA distally with no more than mild stenosis. Calcified plaque at the left bifurcation with associated occlusion of the left ICA at its origin. Left ICA remains occluded within the neck, stable from prior MRA. Vertebral arteries: Both vertebral arteries arise from the subclavian arteries. No high-grade proximal subclavian artery stenosis. Note is made of a 50% stenosis involving the mid left subclavian artery (series 7, image 22). Atheromatous plaque at the origin of the right vertebral artery with moderate to severe ostial stenosis. Vertebral arteries otherwise patent within the neck without high-grade stenosis, dissection or occlusion. Left vertebral artery dominant. Skeleton: No visible acute osseous abnormality. No discrete or worrisome osseous lesions. Other neck: No other acute soft tissue abnormality within the neck. No mass lesion or adenopathy. Upper chest: Visualized upper chest demonstrates no acute finding. Paraseptal and centrilobular emphysematous changes noted within the visualized lungs. Review of the MIP images confirms the above findings CTA HEAD FINDINGS Anterior circulation: Petrous right ICA widely patent. Scattered calcified plaque within the cavernous/supraclinoid right ICA. Associated stenosis of up to approximately 50% at the supraclinoid segment. On the left, the left ICA is occluded at the skull base. Distal reconstitution at the cavernous segment the of collateralization across the circle-of-Willis. Scattered atheromatous plaque  within the supraclinoid left ICA with associated moderate to severe stenosis. ICA termini well perfused. A1 segments patent bilaterally. Left A1 hypoplastic, accounting for the diminutive left ICA is compared to the right. Normal anterior communicating artery complex. Anterior cerebral arteries patent to their distal  aspects without stenosis. M1 segments patent bilaterally without stenosis. Normal MCA bifurcations. Distal MCA branches well perfused and symmetric. Posterior circulation: Scattered atheromatous plaque within the dominant left V4 segment with no more than mild multifocal stenosis. Left PICA patent. Right V4 segment heavily diseased with associated moderate to severe multifocal atheromatous stenoses. Right vertebral artery essentially occludes prior to the vertebrobasilar junction. Right PICA not well seen. Atheromatous irregularity seen involving the proximal-mid basilar artery with associated stenosis of up to approximately 50% at its mid aspect (series 7, image 23). Basilar tip well perfused distally. Superior cerebral arteries patent bilaterally. Both PCAs primarily supplied via the basilar and are well perfused to their distal aspects. Venous sinuses: Grossly patent allowing for timing the contrast bolus. Anatomic variants: None significant.  No aneurysm. Review of the MIP images confirms the above findings IMPRESSION: 1. Negative CTA for emergent large vessel occlusion. 2. Chronic occlusion of the left ICA at its origin, with distal reconstitution at the cavernous segment. 3. Sequelae of prior right carotid endarterectomy with wide patency of the right CCA and ICA. Severe stenosis versus occlusion involving the proximal right ECA as above. 4. Moderate to severe ostial stenosis at the origin of the right vertebral artery, with additional moderate to severe multifocal right V4 stenoses. The right vertebral artery essentially occludes prior to the vertebrobasilar junction. Dominant left vertebral artery patent without significant stenosis. 5. 50% stenosis involving the mid basilar artery. 6. Aortic Atherosclerosis (ICD10-I70.0) and Emphysema (ICD10-J43.9). These results were communicated to Dr. Rory Percy at 10:42 pmon 8/23/2021by text page via the [facility] messaging system. Electronically Signed   By: Jeannine Boga M.D.   On: 03/11/2020 23:03   CT Code Stroke CTA Neck W/WO contrast  Result Date: 03/11/2020 CLINICAL DATA:  Initial evaluation for acute left-sided weakness, dysphagia. EXAM: CT ANGIOGRAPHY HEAD AND NECK TECHNIQUE: Multidetector CT imaging of the head and neck was performed using the standard protocol during bolus administration of intravenous contrast. Multiplanar CT image reconstructions and MIPs were obtained to evaluate the vascular anatomy. Carotid stenosis measurements (when applicable) are obtained utilizing NASCET criteria, using the distal internal carotid diameter as the denominator. CONTRAST:  7m OMNIPAQUE IOHEXOL 350 MG/ML SOLN COMPARISON:  Prior head CT from earlier same day. FINDINGS: CTA NECK FINDINGS Aortic arch: Visualized aortic arch of normal caliber with normal 3 vessel morphology. Moderate to advanced atheromatous change about the aortic arch and origin of the great vessels. Associated 50% atheromatous stenosis at the origin of the left CCA (series 9, image 304). Right carotid system: Scattered atheromatous plaque throughout the right CCA with no more than mild multifocal stenosis. Sequelae of prior right carotid endarterectomy without residual stenosis about the right carotid bifurcation or proximal right ICA. Right ICA patent distally to the skull base without stenosis, dissection or occlusion. Focal high-grade stenosis versus occlusion of the proximal right ECA noted (series 9, image 198). Opacification of the right ECA distally could be related to subocclusive stenosis and/or collateralization. Left carotid system: 50% atheromatous stenosis at the origin of the left CCA. Additional scattered plaque noted within the left CCA distally with no more than mild stenosis. Calcified plaque at the left bifurcation with associated occlusion of the left ICA at its origin. Left  ICA remains occluded within the neck, stable from prior MRA. Vertebral arteries: Both vertebral arteries  arise from the subclavian arteries. No high-grade proximal subclavian artery stenosis. Note is made of a 50% stenosis involving the mid left subclavian artery (series 7, image 22). Atheromatous plaque at the origin of the right vertebral artery with moderate to severe ostial stenosis. Vertebral arteries otherwise patent within the neck without high-grade stenosis, dissection or occlusion. Left vertebral artery dominant. Skeleton: No visible acute osseous abnormality. No discrete or worrisome osseous lesions. Other neck: No other acute soft tissue abnormality within the neck. No mass lesion or adenopathy. Upper chest: Visualized upper chest demonstrates no acute finding. Paraseptal and centrilobular emphysematous changes noted within the visualized lungs. Review of the MIP images confirms the above findings CTA HEAD FINDINGS Anterior circulation: Petrous right ICA widely patent. Scattered calcified plaque within the cavernous/supraclinoid right ICA. Associated stenosis of up to approximately 50% at the supraclinoid segment. On the left, the left ICA is occluded at the skull base. Distal reconstitution at the cavernous segment the of collateralization across the circle-of-Willis. Scattered atheromatous plaque within the supraclinoid left ICA with associated moderate to severe stenosis. ICA termini well perfused. A1 segments patent bilaterally. Left A1 hypoplastic, accounting for the diminutive left ICA is compared to the right. Normal anterior communicating artery complex. Anterior cerebral arteries patent to their distal aspects without stenosis. M1 segments patent bilaterally without stenosis. Normal MCA bifurcations. Distal MCA branches well perfused and symmetric. Posterior circulation: Scattered atheromatous plaque within the dominant left V4 segment with no more than mild multifocal stenosis. Left PICA patent. Right V4 segment heavily diseased with associated moderate to severe multifocal atheromatous stenoses.  Right vertebral artery essentially occludes prior to the vertebrobasilar junction. Right PICA not well seen. Atheromatous irregularity seen involving the proximal-mid basilar artery with associated stenosis of up to approximately 50% at its mid aspect (series 7, image 23). Basilar tip well perfused distally. Superior cerebral arteries patent bilaterally. Both PCAs primarily supplied via the basilar and are well perfused to their distal aspects. Venous sinuses: Grossly patent allowing for timing the contrast bolus. Anatomic variants: None significant.  No aneurysm. Review of the MIP images confirms the above findings IMPRESSION: 1. Negative CTA for emergent large vessel occlusion. 2. Chronic occlusion of the left ICA at its origin, with distal reconstitution at the cavernous segment. 3. Sequelae of prior right carotid endarterectomy with wide patency of the right CCA and ICA. Severe stenosis versus occlusion involving the proximal right ECA as above. 4. Moderate to severe ostial stenosis at the origin of the right vertebral artery, with additional moderate to severe multifocal right V4 stenoses. The right vertebral artery essentially occludes prior to the vertebrobasilar junction. Dominant left vertebral artery patent without significant stenosis. 5. 50% stenosis involving the mid basilar artery. 6. Aortic Atherosclerosis (ICD10-I70.0) and Emphysema (ICD10-J43.9). These results were communicated to Dr. Rory Percy at 10:42 pmon 8/23/2021by text page via the [facility] messaging system. Electronically Signed   By: Jeannine Boga M.D.   On: 03/11/2020 23:03   MR BRAIN WO CONTRAST  Result Date: 03/11/2020 CLINICAL DATA:  Initial evaluation for acute stroke. EXAM: MRI HEAD WITHOUT CONTRAST TECHNIQUE: Multiplanar, multiecho pulse sequences of the brain and surrounding structures were obtained without intravenous contrast. COMPARISON:  Prior CTs from earlier the same day. FINDINGS: Brain: Diffuse prominence of the CSF  containing spaces compatible generalized age-related cerebral atrophy. Patchy T2/FLAIR hyperintensity within the periventricular deep white matter both cerebral hemispheres most consistent with chronic small  vessel ischemic disease, mild in nature. Few scattered superimposed remote lacunar infarcts present about the hemispheric cerebral white matter. Few scattered tiny remote left cerebellar infarcts noted as well. No abnormal foci of restricted diffusion to suggest acute or subacute ischemia. Gray-white matter differentiation maintained. No other areas of chronic cortical infarction. No foci of susceptibility artifact to suggest acute or chronic intracranial hemorrhage. No mass lesion, midline shift or mass effect. Diffuse ventricular prominence related to global parenchymal volume loss without hydrocephalus. No extra-axial fluid collection. Midline structures intact. Pituitary gland suprasellar region normal. Vascular: Abnormal flow void within the left ICA to the cavernous segment, consistent with known chronic left ICA occlusion. Additional abnormal flow void seen within the right V4 segment, consistent with severe atheromatous disease and probable occlusion, also seen on prior CTA. Major intracranial vascular flow voids are otherwise maintained. Skull and upper cervical spine: Craniocervical junction within normal limits. Bone marrow signal intensity normal. No scalp soft tissue abnormality. Sinuses/Orbits: Postoperative changes present about the left globe. Globes and orbital soft tissues demonstrate no acute finding. Paranasal sinuses are largely clear. No mastoid effusion. Inner ear structures grossly normal. Other: None. IMPRESSION: 1. No acute intracranial abnormality. 2. Age-related cerebral atrophy with mild chronic small vessel ischemic disease, with a few scattered remote lacunar infarcts involving the hemispheric cerebral white matter and left cerebellum. 3. Abnormal flow voids within the left ICA to  the cavernous segment, consistent with known chronic left ICA occlusion. Additional abnormal flow void within the right V4 segment, consistent with severe atheromatous disease and probable occlusion, also seen on prior CTA. Electronically Signed   By: Jeannine Boga M.D.   On: 03/11/2020 23:43   DG Chest Port 1 View  Result Date: 03/11/2020 CLINICAL DATA:  Altered mental status EXAM: PORTABLE CHEST 1 VIEW COMPARISON:  11/27/2008 FINDINGS: Prior median sternotomy and valve replacement. Heart and mediastinal contours are within normal limits. No focal opacities or effusions. No acute bony abnormality. IMPRESSION: No active disease. Electronically Signed   By: Rolm Baptise M.D.   On: 03/11/2020 23:54   CT HEAD CODE STROKE WO CONTRAST  Result Date: 03/11/2020 CLINICAL DATA:  Code stroke. Initial evaluation for acute ataxia, slurred speech, left facial droop. EXAM: CT HEAD WITHOUT CONTRAST TECHNIQUE: Contiguous axial images were obtained from the base of the skull through the vertex without intravenous contrast. COMPARISON:  Prior studies from 12/24/2019. FINDINGS: Brain: Advanced age-related cerebral atrophy with chronic microvascular ischemic disease, stable. No acute intracranial hemorrhage. No acute large vessel territory infarct. No mass lesion, midline shift or mass effect. Ventricular prominence related to global parenchymal volume loss without hydrocephalus. No extra-axial fluid collection. Vascular: No hyperdense vessel. Calcified atherosclerosis present at skull base. Skull: Scalp soft tissues and calvarium within normal limits. Sinuses/Orbits: Postoperative changes noted at the left globe. Globes and orbital soft tissues demonstrate no acute finding. Paranasal sinuses are largely clear. No mastoid effusion. Other: None. ASPECTS [facility] Stroke Program Early CT Score) - Ganglionic level infarction (caudate, lentiform nuclei, internal capsule, insula, M1-M3 cortex): 7 - Supraganglionic infarction  (M4-M6 cortex): 3 Total score (0-10 with 10 being normal): 10 IMPRESSION: 1. No acute intracranial infarct or other abnormality. 2. ASPECTS is 10. 3. Generalized age-related cerebral atrophy with chronic microvascular ischemic disease, stable. These results were communicated to Dr. Rory Percy at 10:19 pmon 8/23/2021by text page via the [facility] messaging system. Electronically Signed   By: Jeannine Boga M.D.   On: 03/11/2020 22:26    Procedures Procedures (including critical care time)  Medications Ordered in ED Medications  sodium chloride flush (NS) 0.9 % injection 3 mL (3 mLs Intravenous Not Given 03/11/20 2338)  iohexol (OMNIPAQUE) 350 MG/ML injection 75 mL (75 mLs Intravenous Contrast Given 03/11/20 2224)    ED Course  I have reviewed the triage vital signs and the nursing notes.  Pertinent labs & imaging results that were available during my care of the patient were reviewed by me and considered in my medical decision making (see chart for details).    MDM Rules/Calculators/A&P                          Consult: Reviewed with neurology.  CT and MRI negative.  At this time will complete medical evaluation.  Stable for discharge from perspective of stroke.  Patient presents with history of recent stroke in June.  He did not regain full function but had regained ambulation with a cane and ability to drive a car.  This evening at 7:30 PM, patient's wife reports an abrupt change with slurred speech, weakness and inability to ambulate as the patient's new baseline of independent with a cane.  Code stroke initiated.  Patient is alert and appropriate.  He has had waxing and waning gait dysfunction over the past week.  More acutely tonight his wife noted distinct change in speech and gait function.  Diagnostic evaluation for stroke is negative.  Patient does not show signs of acute infectious etiology.  UA negative.  Chest x-ray pending.  Anticipate discharge with close follow-up and return  precautions reviewed. Final Clinical Impression(s) / ED Diagnoses Final diagnoses:  Gait abnormality  Slurred speech  Severe comorbid illness    Rx / DC Orders ED Discharge Orders    None       Charlesetta Shanks, MD 03/12/20 0002

## 2020-03-11 NOTE — Code Documentation (Signed)
Responded to Code Stroke called on pt already in ED. Pt arrived to ED at 2121. Code Stroke called at 2212 for ataxia, [id number]. Pt is on eliquis-last taken at 9AM this morning. CT head negative for acute changes, CBG-147, NIH-3 for L facial droop, ataxia, and dysarthria.  Pt taken for STAT MRI which was negative for acute stroke. Plan to treat metabolic causes.

## 2020-03-12 ENCOUNTER — Telehealth: Payer: Self-pay | Admitting: Family Medicine

## 2020-03-12 NOTE — Telephone Encounter (Signed)
APPT made

## 2020-03-13 ENCOUNTER — Encounter: Payer: Self-pay | Admitting: Family Medicine

## 2020-03-13 ENCOUNTER — Ambulatory Visit (INDEPENDENT_AMBULATORY_CARE_PROVIDER_SITE_OTHER): Payer: Medicare Other | Admitting: Family Medicine

## 2020-03-13 ENCOUNTER — Other Ambulatory Visit: Payer: Self-pay

## 2020-03-13 VITALS — BP 93/57 | HR 88 | Temp 97.8°F | Ht 65.0 in | Wt 164.0 lb

## 2020-03-13 DIAGNOSIS — E1122 Type 2 diabetes mellitus with diabetic chronic kidney disease: Secondary | ICD-10-CM

## 2020-03-13 DIAGNOSIS — G903 Multi-system degeneration of the autonomic nervous system: Secondary | ICD-10-CM

## 2020-03-13 DIAGNOSIS — G459 Transient cerebral ischemic attack, unspecified: Secondary | ICD-10-CM

## 2020-03-13 DIAGNOSIS — N183 Chronic kidney disease, stage 3 unspecified: Secondary | ICD-10-CM

## 2020-03-13 DIAGNOSIS — I10 Essential (primary) hypertension: Secondary | ICD-10-CM

## 2020-03-13 DIAGNOSIS — E1159 Type 2 diabetes mellitus with other circulatory complications: Secondary | ICD-10-CM | POA: Diagnosis not present

## 2020-03-13 DIAGNOSIS — I152 Hypertension secondary to endocrine disorders: Secondary | ICD-10-CM

## 2020-03-13 MED ORDER — LISINOPRIL 20 MG PO TABS: 20.0000 mg | ORAL_TABLET | Freq: Every day | ORAL | 1 refills | Status: DC

## 2020-03-13 NOTE — Addendum Note (Signed)
Addended by: [name] on: 03/13/2020 04:20 PM   Modules accepted: Orders

## 2020-03-13 NOTE — Progress Notes (Signed)
Subjective:  Patient ID: Evan Stanley, male    DOB: Sep 26, 1940  Age: 79 y.o. MRN: [id number]  CC: ER Follow up   HPI Evan Stanley presents for follow-up from emergency room visit 2 days ago.  Patient had a stroke in early June of this year.  Because of weakness on the left side.  Through physical therapy and his symptoms resolved.  A few days ago he developed some pain in his neck.  He was seen and started on max baclofen and Voltaren gel.  With an 3 doses he was sleeping all the time constantly drowsy and when he was awake he was hallucinating.  He had one episode of vomiting as well.  He started becoming weak and aphasic and was taken back to the emergency room on August 23.  The emergency room evaluation was reviewed and prepped for this visit.  The CT a showed multiple occluded and stenotic vessels in the carotids and the vertebral arteries.  His symptoms remain stable and he was negative for acute stroke therefore he was discharged from the emergency room.  His wife noted that his blood pressure was low when she called the ambulance.  Roughly 90 systolic.  He now is weak on the left lower extremity and they are concerned that the baclofen may have caused this.  Depression screen [facility] 2/9 03/13/2020 02/09/2020 10/02/2019  Decreased Interest 0 0 0  Down, Depressed, Hopeless 0 0 0  PHQ - 2 Score 0 0 0  Altered sleeping - - -  Tired, decreased energy - - -  Change in appetite - - -  Feeling bad or failure about yourself  - - -  Trouble concentrating - - -  Moving slowly or fidgety/restless - - -  Suicidal thoughts - - -  PHQ-9 Score - - -  Difficult doing work/chores - - -  Some recent data might be hidden    History Evan Stanley has a past medical history of AKI (acute kidney injury) ([place]), Allergy, Anxiety, Arthritis, Atrial fibrillation ([place]), Carotid artery stenosis, Cataract, CHF (congestive heart failure) ([place]), CVA (cerebral vascular accident) ([place]) (04/28/2016), Decreased vision,  Diabetes mellitus without complication ([place]), Hyperlipidemia, Hypertension, Salmonella bacteremia (04/29/2016), Sepsis ([place]) (04/29/2016), Stroke ([place]) (11/18/2015), TIA (transient ischemic attack), and Urgency of urination.   He has a past surgical history that includes Aortic valve replacement; laser surgery, left eye  (Left, 10-29-2015 ); Cardiac catheterization (Right, 11/18/2015); Cardiac catheterization (N/A, 11/18/2015); Back surgery (80s); Endarterectomy (Right, 01/13/2016); TEE without cardioversion (N/A, 05/05/2016); Eye surgery; and Cardiac valve replacement (11/01/2008).   His family history includes Cancer in his brother and mother; Diabetes in his brother; Early death in his sister; Healthy in his son and son; Heart disease in his brother, brother, and mother; Hydrocephalus in his brother; Hypertension in his father; Stroke (age of onset: 80) in his father.He reports that he quit smoking about 33 years ago. His smoking use included cigarettes. He has a 40.00 pack-year smoking history. He has never used smokeless tobacco. He reports that he does not drink alcohol and does not use drugs.    ROS Review of Systems  Constitutional: Positive for fatigue. Negative for fever.  Respiratory: Negative for shortness of breath.   Cardiovascular: Negative for chest pain.  Musculoskeletal: Positive for neck pain.  Skin: Negative for rash.  Neurological: Positive for weakness (Left side, since onset of the events of 2 days ago.).    Objective:  BP (!) 93/57    Pulse 88  Temp 97.8 F (36.6 C) (Temporal)    Ht 5' 5"  (1.651 m)    Wt 164 lb (74.4 kg)    BMI 27.29 kg/m   BP Readings from Last 3 Encounters:  03/13/20 (!) 93/57  03/11/20 129/64  03/07/20 122/67    Wt Readings from Last 3 Encounters:  03/13/20 164 lb (74.4 kg)  03/07/20 165 lb 6.4 oz (75 kg)  02/09/20 166 lb 8 oz (75.5 kg)     Physical Exam Vitals reviewed.  Constitutional:      Appearance: He is well-developed.  HENT:      Head: Normocephalic and atraumatic.     Right Ear: Tympanic membrane and external ear normal. No decreased hearing noted.     Left Ear: Tympanic membrane and external ear normal. No decreased hearing noted.     Mouth/Throat:     Pharynx: No oropharyngeal exudate or posterior oropharyngeal erythema.  Eyes:     Pupils: Pupils are equal, round, and reactive to light.  Cardiovascular:     Rate and Rhythm: Normal rate and regular rhythm.     Heart sounds: No murmur heard.   Pulmonary:     Effort: No respiratory distress.     Breath sounds: Normal breath sounds.  Abdominal:     General: Bowel sounds are normal.     Palpations: Abdomen is soft. There is no mass.     Tenderness: There is no abdominal tenderness.  Musculoskeletal:     Cervical back: Normal range of motion and neck supple.     Comments: Strength of the left lower extremity is 4/5.  Right lower extremity is 5/5.  Grip strength bilaterally is 5/5.  Full range of motion of both upper extremities.  Gait is somewhat unsteady.       Assessment & Plan:   There are no diagnoses linked to this encounter.     I have discontinued Evan Stanley "Jakeb"'s baclofen. I am also having him maintain his latanoprost, vitamin E, vitamin C, cetirizine, lisinopril, metoprolol succinate, hydrochlorothiazide, [name] FlexTouch, atorvastatin, insulin lispro, atropine, apixaban, OneTouch Delica Plus [id number], OneTouch Ultra, B-D UF III MINI PEN NEEDLES, Trulicity, and diclofenac Sodium.  Allergies as of 03/13/2020      Reactions   Baclofen Other (See Comments)   Stroke like symptoms      Medication List       Accurate as of March 13, 2020  4:14 PM. If you have any questions, ask your nurse or doctor.        STOP taking these medications   baclofen 10 MG tablet Commonly known as: LIORESAL Stopped by: [name], MD     TAKE these medications   apixaban 5 MG Tabs tablet Commonly known as: ELIQUIS Take 1 tablet (5 mg total) by  mouth 2 (two) times daily.   atorvastatin 40 MG tablet Commonly known as: LIPITOR TAKE 1 TABLET BY MOUTH  DAILY What changed: when to take this   atropine 1 % ophthalmic solution Place 1 drop into the left eye 3 (three) times daily as needed (eye pain).   B-D UF III MINI PEN NEEDLES 31G X 5 MM Misc Generic drug: Insulin Pen Needle USE 4 PENNEEDLES DAILY TO  INJECT INSULIN Dx E11.9   cetirizine 10 MG tablet Commonly known as: ZYRTEC Take 10 mg by mouth daily.   diclofenac Sodium 1 % Gel Commonly known as: Voltaren Apply 2 g topically 4 (four) times daily.   hydrochlorothiazide 25 MG tablet Commonly known as:  HYDRODIURIL Take 1 tablet (25 mg total) by mouth daily.   insulin lispro 100 UNIT/ML injection Commonly known as: HUMALOG Inject 6 Units into the skin daily before supper.   latanoprost 0.005 % ophthalmic solution Commonly known as: XALATAN Place 1 drop into the left eye at bedtime.   lisinopril 40 MG tablet Commonly known as: ZESTRIL Take 1 tablet (40 mg total) by mouth daily.   metoprolol succinate 100 MG 24 hr tablet Commonly known as: TOPROL-XL Take with or immediately following a meal. What changed:   how much to take  how to take this  when to take this   OneTouch Delica Plus [id number] Misc Test BS BID Dx E11.9   OneTouch Ultra test strip Generic drug: glucose blood Test BS BID Dx E11.9   [name] FlexTouch 200 UNIT/ML FlexTouch Pen Generic drug: insulin degludec Inject 50 Units into the skin daily.   Trulicity 1.5 LX/7.2IO Sopn Generic drug: Dulaglutide INJECT THE CONTENTS OF 1  PEN (1.5MG) INTO THE SKIN  WEEKLY AS DIRECTED .   vitamin C 500 MG tablet Commonly known as: ASCORBIC ACID Take 500 mg by mouth daily.   vitamin E 1000 UNIT capsule Generic drug: vitamin E Take 1,000 Units by mouth daily.     Mr. Lamp worked with a physical therapist who came to his home after his stroke occurred 2 to 3 months ago.  His name is Alyson Locket and  his phone number is [phone].  He and his wife would like to have Mr. Nada Maclachlan work with him again if at all possible.   Follow-up: Return in about 6 weeks (around 04/24/2020) for  check of neuro status with Dr. Warrick Parisian, his PCP.  [name], M.D.

## 2020-03-15 ENCOUNTER — Ambulatory Visit: Payer: Medicare Other | Admitting: Family Medicine

## 2020-03-18 ENCOUNTER — Other Ambulatory Visit: Payer: Self-pay

## 2020-03-18 ENCOUNTER — Ambulatory Visit: Payer: Medicare Other | Admitting: Nurse Practitioner

## 2020-03-18 ENCOUNTER — Encounter: Payer: Self-pay | Admitting: Nurse Practitioner

## 2020-03-18 VITALS — BP 112/78 | HR 84 | Ht 65.0 in | Wt 166.8 lb

## 2020-03-18 DIAGNOSIS — Z79899 Other long term (current) drug therapy: Secondary | ICD-10-CM

## 2020-03-18 DIAGNOSIS — E1122 Type 2 diabetes mellitus with diabetic chronic kidney disease: Secondary | ICD-10-CM | POA: Diagnosis not present

## 2020-03-18 DIAGNOSIS — E782 Mixed hyperlipidemia: Secondary | ICD-10-CM

## 2020-03-18 DIAGNOSIS — I739 Peripheral vascular disease, unspecified: Secondary | ICD-10-CM

## 2020-03-18 DIAGNOSIS — N183 Chronic kidney disease, stage 3 unspecified: Secondary | ICD-10-CM | POA: Diagnosis not present

## 2020-03-18 DIAGNOSIS — Z953 Presence of xenogenic heart valve: Secondary | ICD-10-CM

## 2020-03-18 DIAGNOSIS — Z8673 Personal history of transient ischemic attack (TIA), and cerebral infarction without residual deficits: Secondary | ICD-10-CM

## 2020-03-18 DIAGNOSIS — I2510 Atherosclerotic heart disease of native coronary artery without angina pectoris: Secondary | ICD-10-CM | POA: Diagnosis not present

## 2020-03-18 DIAGNOSIS — N1831 Chronic kidney disease, stage 3a: Secondary | ICD-10-CM

## 2020-03-18 DIAGNOSIS — I480 Paroxysmal atrial fibrillation: Secondary | ICD-10-CM

## 2020-03-18 DIAGNOSIS — E1121 Type 2 diabetes mellitus with diabetic nephropathy: Secondary | ICD-10-CM

## 2020-03-18 LAB — BASIC METABOLIC PANEL
BUN/Creatinine Ratio: 13 (ref 10–24)
BUN: 19 mg/dL (ref 8–27)
CO2: 27 mmol/L (ref 20–29)
Calcium: 9.3 mg/dL (ref 8.6–10.2)
Chloride: 103 mmol/L (ref 96–106)
Creatinine, Ser: 1.49 mg/dL — ABNORMAL HIGH (ref 0.76–1.27)
GFR calc Af Amer: 52 mL/min/{1.73_m2} — ABNORMAL LOW (ref >59)
GFR calc non Af Amer: 45 mL/min/{1.73_m2} — ABNORMAL LOW (ref >59)
Glucose: 94 mg/dL (ref 65–99)
Potassium: 4.4 mmol/L (ref 3.5–5.2)
Sodium: 142 mmol/L (ref 134–144)

## 2020-03-18 NOTE — Patient Instructions (Addendum)
After Visit Summary:  We will be checking the following labs today - BMET   Medication Instructions:    Continue with your current medicines.    If you need a refill on your cardiac medications before your next appointment, please call your pharmacy.     Testing/Procedures To Be Arranged:  N/A  Follow-Up:   See Dr. [name] in 3 to 4 months.     At [facility], you and your health needs are our priority.  As part of our continuing mission to provide you with exceptional heart care, we have created designated Provider Care Teams.  These Care Teams include your primary Cardiologist (physician) and Advanced Practice Providers (APPs -  Physician Assistants and Nurse Practitioners) who all work together to provide you with the care you need, when you need it.  Special Instructions:  . Stay safe, wash your hands for at least 20 seconds and wear a mask when needed.  . It was good to talk with you today.    Call the [place] office at [phone] if you have any questions, problems or concerns.

## 2020-03-27 ENCOUNTER — Ambulatory Visit: Payer: Medicare Other | Admitting: Family Medicine

## 2020-03-28 ENCOUNTER — Other Ambulatory Visit: Payer: Self-pay | Admitting: *Deleted

## 2020-03-28 ENCOUNTER — Telehealth: Payer: Self-pay | Admitting: Endocrinology

## 2020-03-28 NOTE — Telephone Encounter (Signed)
Medication Refill Request  Did you call your pharmacy and request this refill first? Yes-Requests new PHARM . If patient has not contacted pharmacy first, instruct them to do so for future refills.  . Remind them that contacting the pharmacy for their refill is the quickest method to get the refill.  . Refill policy also stated that it will take anywhere between 24-72 hours to receive the refill.    Name of medication?  TRULICITY 1.5 DT/1.4HO SOPN  Is this a 90 day supply? yes  Name and location of pharmacy?  [facility] through [employer] Ph# [phone] Fax# [phone]  AND Medication Refill Request  Did you call your pharmacy and request this refill first? Different PHARM than above request . If patient has not contacted pharmacy first, instruct them to do so for future refills.  . Remind them that contacting the pharmacy for their refill is the quickest method to get the refill.  . Refill policy also stated that it will take anywhere between 24-72 hours to receive the refill.    Name of medication?   insulin degludec (TRESIBA FLEXTOUCH) 200 UNIT/ML FlexTouch Pen  Is this a 90 day supply? Yes  Name and location of pharmacy?  Patient states that the above medication is ordered from [facility] PH# [phone] and delivered to our office and patient picks up medication

## 2020-03-28 NOTE — Telephone Encounter (Signed)
Faxed request to [facility] for refill.

## 2020-04-03 ENCOUNTER — Ambulatory Visit: Payer: Medicare Other | Admitting: Family Medicine

## 2020-04-04 ENCOUNTER — Telehealth: Payer: Self-pay

## 2020-04-04 NOTE — Telephone Encounter (Signed)
FAXED [place]: [place]: Rx refill forms for [employer] Other records requested: None requested  All above requested information has been faxed successfully to [employer] [facility] listed above. Documents and fax confirmation have been placed in the faxed file for future reference.

## 2020-04-10 ENCOUNTER — Encounter: Payer: Self-pay | Admitting: Endocrinology

## 2020-04-10 ENCOUNTER — Other Ambulatory Visit: Payer: Self-pay

## 2020-04-10 ENCOUNTER — Ambulatory Visit: Payer: Medicare Other | Admitting: Endocrinology

## 2020-04-10 VITALS — BP 108/64 | HR 88 | Wt 167.0 lb

## 2020-04-10 DIAGNOSIS — Z794 Long term (current) use of insulin: Secondary | ICD-10-CM

## 2020-04-10 DIAGNOSIS — E1165 Type 2 diabetes mellitus with hyperglycemia: Secondary | ICD-10-CM

## 2020-04-10 LAB — POCT GLYCOSYLATED HEMOGLOBIN (HGB A1C): Hemoglobin A1C: 6.4 % — AB (ref 4.0–5.6)

## 2020-04-10 MED ORDER — TRULICITY 1.5 MG/0.5ML ~~LOC~~ SOAJ: SUBCUTANEOUS | 0 refills | Status: DC

## 2020-04-10 NOTE — Progress Notes (Signed)
Patient ID: Evan Stanley, male   DOB: [birth year], 79 y.o.   MRN: [id number]          Reason for Appointment: Follow-up for Type 2 Diabetes    History of Present Illness:          Date of diagnosis of type 2 diabetes mellitus: 2010       Background history:   He thinks he was diagnosed to have diabetes after his coronary bypass surgery and blood sugars are not very high He had been on metformin until about 05/2017 Also at some point had taken glimepiride Usually appears to have had A1c just above 7%  Recent history:   INSULIN regimen is: [place]  50  units daily, Humalog  usually 6 units at dinner     Non-insulin hypoglycemic drugs the patient is taking are: Trulicity 1.5 mg weekly  His A1c is back down to 6.4, previously was higher at 7.4   Current management, blood sugar patterns and problems identified:  He does check his blood sugars consistently before breakfast and after dinner  Most of his high sugars in the evenings are related to eating ice cream  Currently not aware that he can adjust his mealtime dose based on what he is eating  Overall blood sugars are lower compared to the last visit when he was still having readings as high as 325  He may be getting a little better with cutting back on high fat foods  Still not able to do any exercise  His weight is slightly higher  No hypoglycemia  Currently getting his Trulicity and insulin from patient assistance programs        Side effects from medications have been: None  Glucose monitoring:  done about 2 times a day         Glucometer: One Touch.       Blood Glucose readings by review of home monitor download show   PRE-MEAL Fasting Lunch Dinner Bedtime Overall  Glucose range:  75-153      Mean/median:  109     127   POST-MEAL PC Breakfast PC Lunch PC Dinner  Glucose range:  84-211   89-209  Mean/median:  109   146   Previously:   PRE-MEAL Fasting Lunch Dinner Bedtime Overall  Glucose range:   93-157      Mean/median:  115     136   POST-MEAL PC Breakfast PC Lunch PC Dinner  Glucose range:    91-203  Mean/median:    160   Previous readings:  PRE-MEAL Fasting Lunch Dinner Bedtime Overall  Glucose range: 103-134      Mean/median:  118     142   POST-MEAL PC Breakfast PC Lunch PC Dinner  Glucose range:    129-325  Mean/median:    205     Self-care: The diet that the patient has been following is: tries to limit sweets.     Meal times are:  Breakfast is at after 9 AM, diiner 6  Typical meal intake: Breakfast is yogurt at times, otherwise eggs            Dietician visit, most recent: 2019                Weight history:  Wt Readings from Last 3 Encounters:  04/10/20 167 lb (75.8 kg)  03/18/20 166 lb 12.8 oz (75.7 kg)  03/13/20 164 lb (74.4 kg)    Glycemic control:   Lab Results  Component  Value Date   HGBA1C 6.4 (A) 04/10/2020   HGBA1C 7.4 (H) 12/25/2019   HGBA1C 5.8 (A) 09/06/2019   Lab Results  Component Value Date   MICROALBUR 5.1 (H) 09/06/2019   LDLCALC 47 12/25/2019   CREATININE 1.49 (H) 03/18/2020   Lab Results  Component Value Date   MICRALBCREAT 2.4 09/06/2019    Lab Results  Component Value Date   FRUCTOSAMINE 323 (H) 04/22/2018      Allergies as of 04/10/2020      Reactions   Baclofen Other (See Comments)   Stroke like symptoms      Medication List       Accurate as of April 10, 2020  1:43 PM. If you have any questions, ask your nurse or doctor.        apixaban 5 MG Tabs tablet Commonly known as: ELIQUIS Take 1 tablet (5 mg total) by mouth 2 (two) times daily.   atorvastatin 40 MG tablet Commonly known as: LIPITOR TAKE 1 TABLET BY MOUTH  DAILY   atropine 1 % ophthalmic solution Place 1 drop into the left eye 3 (three) times daily as needed (eye pain).   B-D UF III MINI PEN NEEDLES 31G X 5 MM Misc Generic drug: Insulin Pen Needle USE 4 PENNEEDLES DAILY TO  INJECT INSULIN Dx E11.9   cetirizine 10 MG  tablet Commonly known as: ZYRTEC Take 10 mg by mouth daily.   diclofenac Sodium 1 % Gel Commonly known as: Voltaren Apply 2 g topically 4 (four) times daily.   hydrochlorothiazide 25 MG tablet Commonly known as: HYDRODIURIL Take 1 tablet (25 mg total) by mouth daily.   insulin lispro 100 UNIT/ML injection Commonly known as: HUMALOG Inject 6 Units into the skin daily before supper.   latanoprost 0.005 % ophthalmic solution Commonly known as: XALATAN Place 1 drop into the left eye at bedtime.   lisinopril 20 MG tablet Commonly known as: ZESTRIL Take 1 tablet (20 mg total) by mouth daily.   metoprolol succinate 100 MG 24 hr tablet Commonly known as: TOPROL-XL Take with or immediately following a meal.   OneTouch Delica Plus [id number] Misc Test BS BID Dx E11.9   OneTouch Ultra test strip Generic drug: glucose blood Test BS BID Dx E11.9   [name] FlexTouch 200 UNIT/ML FlexTouch Pen Generic drug: insulin degludec Inject 50 Units into the skin daily.   Trulicity 1.5 KG/4.0NU Sopn Generic drug: Dulaglutide INJECT THE CONTENTS OF 1  PEN (1.5MG) INTO THE SKIN  WEEKLY AS DIRECTED . What changed: Another medication with the same name was added. Make sure you understand how and when to take each. Changed by: [name], MD   Trulicity 1.5 UV/2.5DG Sopn Generic drug: Dulaglutide 1.5 What changed: You were already taking a medication with the same name, and this prescription was added. Make sure you understand how and when to take each. Changed by: [name], MD   vitamin C 500 MG tablet Commonly known as: ASCORBIC ACID Take 500 mg by mouth daily.   vitamin E 1000 UNIT capsule Generic drug: vitamin E Take 1,000 Units by mouth daily.       Allergies:  Allergies  Allergen Reactions  . Baclofen Other (See Comments)    Stroke like symptoms    Past Medical History:  Diagnosis Date  . AKI (acute kidney injury) ([place])   . Allergy   . Anxiety   . Arthritis    Right  shoulder  . Atrial fibrillation ([place])   . Carotid  artery stenosis   . Cataract   . CHF (congestive heart failure) ([place])   . CVA (cerebral vascular accident) ([place]) 04/28/2016  . Decreased vision    left eye  . Diabetes mellitus without complication ([place])    Takes Metformin  . Hyperlipidemia   . Hypertension   . Salmonella bacteremia 04/29/2016  . Sepsis ([place]) 04/29/2016  . Stroke ([place]) 11/18/2015   no deficits  . TIA (transient ischemic attack)    affected left eye  . Urgency of urination     Past Surgical History:  Procedure Laterality Date  . AORTIC VALVE REPLACEMENT    . BACK SURGERY  80s  . CARDIAC VALVE REPLACEMENT  11/01/2008   21-mm Edwards Pericardial Magna - Ease valve  . ENDARTERECTOMY Right 01/13/2016   Procedure: ENDARTERECTOMY CAROTID - RIGHT;  Surgeon: Conrad Scappoose, MD;  Location: [facility];  Service: Vascular;  Laterality: Right;  . EYE SURGERY    . laser surgery, left eye  Left 10-29-2015    retinal surgery/ Deloria Lair MD   . PERIPHERAL VASCULAR CATHETERIZATION Right 11/18/2015   Procedure: Carotid Angiography;  Surgeon: Conrad Imperial, MD;  Location: [facility];  Service: Cardiovascular;  Laterality: Right;  . PERIPHERAL VASCULAR CATHETERIZATION N/A 11/18/2015   Procedure: Aortic Arch Angiography;  Surgeon: Conrad Searles, MD;  Location: [facility];  Service: Cardiovascular;  Laterality: N/A;  . TEE WITHOUT CARDIOVERSION N/A 05/05/2016   Procedure: TRANSESOPHAGEAL ECHOCARDIOGRAM (TEE);  Surgeon: Lelon Perla, MD;  Location[facility] ENDOSCOPY;  Service: Cardiovascular;  Laterality: N/A;    Family History  Problem Relation Age of Onset  . Heart disease Mother   . Cancer Mother        breast  . Stroke Father 76  . Hypertension Father   . Early death Sister        infant death  . Heart disease Brother   . Hydrocephalus Brother   . Heart disease Brother   . Diabetes Brother   . Cancer Brother        prostat  . Healthy Son   . Healthy Son      Social History:  reports that he quit smoking about 33 years ago. His smoking use included cigarettes. He has a 40.00 pack-year smoking history. He has never used smokeless tobacco. He reports that he does not drink alcohol and does not use drugs.   Review of Systems   Lipid history: Currently on atorvastatin 40 mg daily prescribed by his PCP   Has history of CAD and CVD    Lab Results  Component Value Date   CHOL 89 12/25/2019   HDL 30 (L) 12/25/2019   LDLCALC 47 12/25/2019   LDLDIRECT 62 02/16/2017   TRIG 60 12/25/2019   CHOLHDL 3.0 12/25/2019           Hypertension:  His treatment includes hydrochlorothiazide 25 mg and lisinopril 40 mg daily,   prescribed by PCP Also monitors at home  BP Readings from Last 3 Encounters:  04/10/20 108/64  03/18/20 112/78  03/13/20 (!) 93/57     CKD: Creatinine as follows  Lab Results  Component Value Date   CREATININE 1.49 (H) 03/18/2020   CREATININE 2.60 (H) 03/11/2020   CREATININE 2.65 (H) 03/11/2020     Most recent foot exam: 9/20  Recently had a CVA and is now on Eliquis  Physical Examination:  BP 108/64   Pulse 88   Wt 167 lb (75.8 kg)  SpO2 98%   BMI 27.79 kg/m       ASSESSMENT:  Diabetes type 2, on insulin   See history of present illness for detailed discussion of current diabetes management, blood sugar patterns and problems identified  His A1c is improved at 6.4  He has likely improved his diet with better postprandial readings Also still benefiting from 1.5 Trulicity Fasting blood sugars are near normal without hypoglycemia overnight   PLAN:    He will continue 50 units of Tresiba but if he has blood sugars below 100 consistently can get her down to 46  May take 8-9 units Humalog at dinnertime if eating ice cream instead of 6 units  Sample of Trulicity given in case he runs out of his supply  He can check his blood sugars only half the time in the morning   Patient Instructions   Check blood sugars on waking up 3-4 days a week  Also check blood sugars about 2 hours after meals and do this after different meals by rotation  Recommended blood sugar levels on waking up are 90-130 and about 2 hours after meal is 130-180  Please bring your blood sugar monitor to each visit, thank you  8 U Humalog if eating ice cream      [name] 04/10/2020, 1:43 PM   Note: This office note was prepared with Dragon voice recognition system technology. Any transcriptional errors that result from this process are unintentional.

## 2020-04-10 NOTE — Patient Instructions (Signed)
Check blood sugars on waking up 3-4 days a week  Also check blood sugars about 2 hours after meals and do this after different meals by rotation  Recommended blood sugar levels on waking up are 90-130 and about 2 hours after meal is 130-180  Please bring your blood sugar monitor to each visit, thank you  8 U Humalog if eating ice cream

## 2020-04-15 ENCOUNTER — Other Ambulatory Visit: Payer: Self-pay | Admitting: Family Medicine

## 2020-04-15 MED ORDER — APIXABAN 5 MG PO TABS
5.0000 mg | ORAL_TABLET | Freq: Two times a day (BID) | ORAL | 0 refills | Status: DC
Start: 2020-04-15 — End: 2020-06-17

## 2020-04-15 NOTE — Telephone Encounter (Signed)
LMOVM refill sent to [facility]

## 2020-04-15 NOTE — Telephone Encounter (Signed)
°  Prescription Request  04/15/2020  What is the name of the medication or equipment? apixaban (ELIQUIS) 5 MG TABS tablet  Have you contacted your pharmacy to request a refill? (if applicable) no  Which pharmacy would you like this sent to? [facility]   Patient notified that their request is being sent to the clinical staff for review and that they should receive a response within 2 business days.

## 2020-04-17 ENCOUNTER — Telehealth: Payer: Self-pay | Admitting: Family Medicine

## 2020-04-17 ENCOUNTER — Telehealth: Payer: Self-pay | Admitting: Endocrinology

## 2020-04-17 NOTE — Telephone Encounter (Signed)
Pt spouse asked to talk to Almyra Free about assistance program for toshiba please call back

## 2020-04-17 NOTE — Telephone Encounter (Signed)
This is done through the patient assistance programs

## 2020-04-17 NOTE — Telephone Encounter (Signed)
Call returned to patient Discussed refill via patient assistance

## 2020-04-17 NOTE — Telephone Encounter (Signed)
Patient's wife called to request that the [place] 1.5 (new dose RX) and Trulicity 1.5 (refill) prescriptions be resent to [facility]

## 2020-04-18 NOTE — Telephone Encounter (Signed)
Forms filled out and given to Dr. Dwyane Dee to sign.

## 2020-04-18 NOTE — Telephone Encounter (Signed)
Patient doesn't meet eliquis criteria 3% OOP yet for patient assistance

## 2020-04-19 ENCOUNTER — Other Ambulatory Visit: Payer: Self-pay | Admitting: Family Medicine

## 2020-04-19 DIAGNOSIS — I152 Hypertension secondary to endocrine disorders: Secondary | ICD-10-CM

## 2020-04-19 DIAGNOSIS — E1169 Type 2 diabetes mellitus with other specified complication: Secondary | ICD-10-CM

## 2020-04-22 ENCOUNTER — Telehealth: Payer: Self-pay

## 2020-04-22 ENCOUNTER — Other Ambulatory Visit: Payer: Self-pay

## 2020-04-22 NOTE — Telephone Encounter (Signed)
Unable to process forms for lilly and novo n[facility] as Dr. Ronnie Derby office has been prescribing these medications Encouraged patient to call Dr. Ronnie Derby office or [facility] to assist We can assist with her paperwork next year.  Encouraged patient to make appt with me in December  Tresiba sample placed in [facility] (MLP#[id number], EXP 2/23)

## 2020-04-22 NOTE — Telephone Encounter (Signed)
[employer] [phone]. Pt spouse request provider call in Trulicity for pt.  Fax [phone]. Please call wife to update [employer] for Tresiba 1.5 To be delivered & pt to pick up at office.

## 2020-04-23 MED ORDER — TRULICITY 1.5 MG/0.5ML ~~LOC~~ SOAJ: SUBCUTANEOUS | 3 refills | Status: DC

## 2020-04-23 NOTE — Telephone Encounter (Signed)
F/u    Wife is calling upset and wanting to know can prescription be sent in today under Evan Stanley.    The epic system name was under Evan Stanley which they do not recognize. Wife is aware prescription may takes up to 3 business days to process.

## 2020-04-23 NOTE — Telephone Encounter (Addendum)
Spoke with [employer] and was able to call in a verbal on the Trulicity. Vicente Males has received the paperwork from [employer] and will give to Dr. Dwyane Dee to fill out for the [place].

## 2020-04-25 ENCOUNTER — Encounter: Payer: Self-pay | Admitting: Family Medicine

## 2020-04-25 ENCOUNTER — Ambulatory Visit (INDEPENDENT_AMBULATORY_CARE_PROVIDER_SITE_OTHER): Payer: Medicare Other

## 2020-04-25 ENCOUNTER — Ambulatory Visit (INDEPENDENT_AMBULATORY_CARE_PROVIDER_SITE_OTHER): Payer: Medicare Other | Admitting: Family Medicine

## 2020-04-25 ENCOUNTER — Other Ambulatory Visit: Payer: Self-pay

## 2020-04-25 VITALS — BP 109/67 | HR 73 | Temp 98.5°F | Resp 20 | Ht 65.0 in | Wt 165.0 lb

## 2020-04-25 DIAGNOSIS — Z794 Long term (current) use of insulin: Secondary | ICD-10-CM

## 2020-04-25 DIAGNOSIS — I6990 Unspecified sequelae of unspecified cerebrovascular disease: Secondary | ICD-10-CM

## 2020-04-25 DIAGNOSIS — R2689 Other abnormalities of gait and mobility: Secondary | ICD-10-CM

## 2020-04-25 DIAGNOSIS — N1832 Chronic kidney disease, stage 3b: Secondary | ICD-10-CM | POA: Diagnosis not present

## 2020-04-25 DIAGNOSIS — E1122 Type 2 diabetes mellitus with diabetic chronic kidney disease: Secondary | ICD-10-CM | POA: Diagnosis not present

## 2020-04-25 DIAGNOSIS — S7002XA Contusion of left hip, initial encounter: Secondary | ICD-10-CM

## 2020-04-25 DIAGNOSIS — Z23 Encounter for immunization: Secondary | ICD-10-CM

## 2020-04-25 DIAGNOSIS — M25552 Pain in left hip: Secondary | ICD-10-CM | POA: Diagnosis not present

## 2020-04-25 DIAGNOSIS — Z7901 Long term (current) use of anticoagulants: Secondary | ICD-10-CM

## 2020-04-25 MED ORDER — TRESIBA FLEXTOUCH 200 UNIT/ML ~~LOC~~ SOPN
50.0000 [IU] | PEN_INJECTOR | Freq: Every day | SUBCUTANEOUS | 5 refills | Status: DC
Start: 2020-04-25 — End: 2021-12-10

## 2020-04-25 NOTE — Progress Notes (Signed)
Subjective:  Patient ID: Evan Stanley, male    DOB: 1940/08/31  Age: 79 y.o. MRN: [id number]  CC: 6 week follow up   HPI Evan Stanley presents for presents forFollow-up of diabetes. Patient checks blood sugar at home.  Improving with   Current regimen. Patient denies symptoms such as polyuria, polydipsia, excessive hunger, nausea No significant hypoglycemic spells noted. Medications reviewed. Pt reports taking them regularly without complication/adverse reaction being reported today.  He has continued loss of balance, and PT was not started. HE remains unsteady due to his recent CVA and resulting weakness on the left. He had a fall earlier today.   Depression screen [name] 2/9 04/25/2020 03/13/2020 02/09/2020  Decreased Interest 0 0 0  Down, Depressed, Hopeless 0 0 0  PHQ - 2 Score 0 0 0  Altered sleeping - - -  Tired, decreased energy - - -  Change in appetite - - -  Feeling bad or failure about yourself  - - -  Trouble concentrating - - -  Moving slowly or fidgety/restless - - -  Suicidal thoughts - - -  PHQ-9 Score - - -  Difficult doing work/chores - - -  Some recent data might be hidden    History Evan Stanley has a past medical history of AKI (acute kidney injury) ([place]), Allergy, Anxiety, Arthritis, Atrial fibrillation ([place]), Carotid artery stenosis, Cataract, CHF (congestive heart failure) ([place]), CVA (cerebral vascular accident) ([place]) (04/28/2016), Decreased vision, Diabetes mellitus without complication ([place]), Hyperlipidemia, Hypertension, Salmonella bacteremia (04/29/2016), Sepsis ([place]) (04/29/2016), Stroke ([place]) (11/18/2015), TIA (transient ischemic attack), and Urgency of urination.   He has a past surgical history that includes Aortic valve replacement; laser surgery, left eye  (Left, 10-29-2015 ); Cardiac catheterization (Right, 11/18/2015); Cardiac catheterization (N/A, 11/18/2015); Back surgery (80s); Endarterectomy (Right, 01/13/2016); TEE without cardioversion (N/A, 05/05/2016);  Eye surgery; and Cardiac valve replacement (11/01/2008).   His family history includes Cancer in his brother and mother; Diabetes in his brother; Early death in his sister; Healthy in his son and son; Heart disease in his brother, brother, and mother; Hydrocephalus in his brother; Hypertension in his father; Stroke (age of onset: 70) in his father.He reports that he quit smoking about 33 years ago. His smoking use included cigarettes. He has a 40.00 pack-year smoking history. He has never used smokeless tobacco. He reports that he does not drink alcohol and does not use drugs.    ROS Review of Systems  Constitutional: Negative for fever.  Respiratory: Negative for shortness of breath.   Cardiovascular: Negative for chest pain.  Musculoskeletal: Positive for gait problem.  Skin: Negative for rash.  Neurological: Positive for weakness (leff side). Negative for dizziness and light-headedness.    Objective:  BP 109/67   Pulse 73   Temp 98.5 F (36.9 C) (Temporal)   Resp 20   Ht _0  (1.651 m)   Wt 165 lb (74.8 kg)   SpO2 97%   BMI 27.46 kg/m   BP Readings from Last 3 Encounters:  04/25/20 109/67  04/10/20 108/64  03/18/20 112/78    Wt Readings from Last 3 Encounters:  04/25/20 165 lb (74.8 kg)  04/10/20 167 lb (75.8 kg)  03/18/20 166 lb 12.8 oz (75.7 kg)     Physical Exam Vitals reviewed.  Constitutional:      Appearance: He is well-developed.  HENT:     Head: Normocephalic and atraumatic.     Right Ear: External ear normal.     Left Ear: External  ear normal.     Mouth/Throat:     Pharynx: No oropharyngeal exudate or posterior oropharyngeal erythema.  Eyes:     Pupils: Pupils are equal, round, and reactive to light.  Cardiovascular:     Rate and Rhythm: Normal rate and regular rhythm.     Heart sounds: No murmur heard.   Pulmonary:     Effort: No respiratory distress.     Breath sounds: Normal breath sounds.  Musculoskeletal:        General: Tenderness (left  hip) present.     Cervical back: Normal range of motion and neck supple.  Neurological:     Mental Status: He is alert and oriented to person, place, and time.     Sensory: No sensory deficit.     Coordination: Coordination abnormal.     Gait: Gait abnormal.       Assessment & Plan:   Evan Stanley was seen today for 6 week follow up.  Diagnoses and all orders for this visit:  Late effects of cerebrovascular disease -     For home use only DME Other see comment -     CBC with Differential/Platelet -     CMP14+EGFR  Type 2 diabetes mellitus with stage 3b chronic kidney disease, with long-term current use of insulin (HCC) -     CBC with Differential/Platelet -     CMP14+EGFR  Loss of balance -     Ambulatory referral to Physical Therapy -     For home use only DME Other see comment -     DG HIP UNILAT W OR W/O PELVIS 2-3 VIEWS LEFT; Future -     CBC with Differential/Platelet -     CMP14+EGFR  Contusion of left hip, initial encounter -     DG HIP UNILAT W OR W/O PELVIS 2-3 VIEWS LEFT; Future -     CBC with Differential/Platelet -     CMP14+EGFR  Current use of long term anticoagulation -     CBC with Differential/Platelet -     CMP14+EGFR  Other orders -     insulin degludec (TRESIBA FLEXTOUCH) 200 UNIT/ML FlexTouch Pen; Inject 50 Units into the skin daily.       I am having Evan Stanley maintain his latanoprost, vitamin E, vitamin C, cetirizine, insulin lispro, atropine, OneTouch Delica Plus [id number], OneTouch Ultra, B-D UF III MINI PEN NEEDLES, diclofenac Sodium, lisinopril, Trulicity, apixaban, atorvastatin, hydrochlorothiazide, metoprolol succinate, Trulicity, and [employer].  Allergies as of 04/25/2020      Reactions   Baclofen Other (See Comments)   Stroke like symptoms      Medication List       Accurate as of April 25, 2020  3:23 PM. If you have any questions, ask your nurse or doctor.        apixaban 5 MG Tabs tablet Commonly known as:  ELIQUIS Take 1 tablet (5 mg total) by mouth 2 (two) times daily.   atorvastatin 40 MG tablet Commonly known as: LIPITOR TAKE 1 TABLET BY MOUTH  DAILY   atropine 1 % ophthalmic solution Place 1 drop into the left eye 3 (three) times daily as needed (eye pain).   B-D UF III MINI PEN NEEDLES 31G X 5 MM Misc Generic drug: Insulin Pen Needle USE 4 PENNEEDLES DAILY TO  INJECT INSULIN Dx E11.9   cetirizine 10 MG tablet Commonly known as: ZYRTEC Take 10 mg by mouth daily.   diclofenac Sodium 1 % Gel Commonly known as:  Voltaren Apply 2 g topically 4 (four) times daily.   hydrochlorothiazide 25 MG tablet Commonly known as: HYDRODIURIL TAKE 1 TABLET BY MOUTH  DAILY   insulin lispro 100 UNIT/ML injection Commonly known as: HUMALOG Inject 6 Units into the skin daily before supper.   latanoprost 0.005 % ophthalmic solution Commonly known as: XALATAN Place 1 drop into the left eye at bedtime.   lisinopril 20 MG tablet Commonly known as: ZESTRIL Take 1 tablet (20 mg total) by mouth daily.   metoprolol succinate 100 MG 24 hr tablet Commonly known as: TOPROL-XL TAKE 1 TABLET BY MOUTH  DAILY WITH OR IMMEDIATELY  FOLLOWING A MEAL   OneTouch Delica Plus [id number] Misc Test BS BID Dx E11.9   OneTouch Ultra test strip Generic drug: glucose blood Test BS BID Dx E11.9   [name] FlexTouch 200 UNIT/ML FlexTouch Pen Generic drug: insulin degludec Inject 50 Units into the skin daily.   Trulicity 1.5 OQ/8.6LT Sopn Generic drug: Dulaglutide 1.5   Trulicity 1.5 VT/8.2SO Sopn Generic drug: Dulaglutide INJECT THE CONTENTS OF 1  PEN (1.5MG) INTO THE SKIN  WEEKLY AS DIRECTED .   vitamin C 500 MG tablet Commonly known as: ASCORBIC ACID Take 500 mg by mouth daily.   vitamin E 1000 UNIT capsule Generic drug: vitamin E Take 1,000 Units by mouth daily.            Durable Medical Equipment  (From admission, onward)         Start     Ordered   04/25/20 0000  For home use only DME  Other see comment       Comments: Quad cane DX:R26.89  Question:  Length of Need  Answer:  Lifetime   04/25/20 1517           Follow-up: No follow-ups on file.  Evan Stanley, M.D.

## 2020-04-26 LAB — CBC WITH DIFFERENTIAL/PLATELET
Basophils Absolute: 0.1 10*3/uL (ref 0.0–0.2)
Basos: 1 %
EOS (ABSOLUTE): 0.2 10*3/uL (ref 0.0–0.4)
Eos: 2 %
Hematocrit: 50.1 % (ref 37.5–51.0)
Hemoglobin: 16.3 g/dL (ref 13.0–17.7)
Immature Grans (Abs): 0 10*3/uL (ref 0.0–0.1)
Immature Granulocytes: 0 %
Lymphocytes Absolute: 2 10*3/uL (ref 0.7–3.1)
Lymphs: 19 %
MCH: 27.7 pg (ref 26.6–33.0)
MCHC: 32.5 g/dL (ref 31.5–35.7)
MCV: 85 fL (ref 79–97)
Monocytes Absolute: 1.2 10*3/uL — ABNORMAL HIGH (ref 0.1–0.9)
Monocytes: 11 %
Neutrophils Absolute: 7 10*3/uL (ref 1.4–7.0)
Neutrophils: 67 %
Platelets: 156 10*3/uL (ref 150–450)
RBC: 5.89 x10E6/uL — ABNORMAL HIGH (ref 4.14–5.80)
RDW: 13.8 % (ref 11.6–15.4)
WBC: 10.5 10*3/uL (ref 3.4–10.8)

## 2020-04-26 LAB — CMP14+EGFR
ALT: 27 IU/L (ref 0–44)
AST: 22 IU/L (ref 0–40)
Albumin/Globulin Ratio: 2 (ref 1.2–2.2)
Albumin: 4.4 g/dL (ref 3.7–4.7)
Alkaline Phosphatase: 105 IU/L (ref 44–121)
BUN/Creatinine Ratio: 13 (ref 10–24)
BUN: 25 mg/dL (ref 8–27)
Bilirubin Total: 0.5 mg/dL (ref 0.0–1.2)
CO2: 27 mmol/L (ref 20–29)
Calcium: 9.4 mg/dL (ref 8.6–10.2)
Chloride: 104 mmol/L (ref 96–106)
Creatinine, Ser: 1.98 mg/dL — ABNORMAL HIGH (ref 0.76–1.27)
GFR calc Af Amer: 37 mL/min/{1.73_m2} — ABNORMAL LOW (ref >59)
GFR calc non Af Amer: 32 mL/min/{1.73_m2} — ABNORMAL LOW (ref >59)
Globulin, Total: 2.2 g/dL (ref 1.5–4.5)
Glucose: 84 mg/dL (ref 65–99)
Potassium: 3.7 mmol/L (ref 3.5–5.2)
Sodium: 144 mmol/L (ref 134–144)
Total Protein: 6.6 g/dL (ref 6.0–8.5)

## 2020-05-01 ENCOUNTER — Ambulatory Visit: Payer: Medicare Other | Attending: Family Medicine | Admitting: Physical Therapy

## 2020-05-01 ENCOUNTER — Encounter: Payer: Self-pay | Admitting: Physical Therapy

## 2020-05-01 ENCOUNTER — Other Ambulatory Visit: Payer: Self-pay

## 2020-05-01 DIAGNOSIS — R296 Repeated falls: Secondary | ICD-10-CM | POA: Diagnosis not present

## 2020-05-01 DIAGNOSIS — M6281 Muscle weakness (generalized): Secondary | ICD-10-CM | POA: Diagnosis not present

## 2020-05-01 DIAGNOSIS — R2681 Unsteadiness on feet: Secondary | ICD-10-CM

## 2020-05-01 NOTE — Therapy (Signed)
[facility] [place], [place], [address] Phone: [phone]   Fax:  [phone]  Physical Therapy Evaluation  Patient Details  Name: Evan Stanley MRN: [id number] Date of Birth: 04-08-1941 Referring Provider (PT): [name], MD   Encounter Date: 05/01/2020   PT End of Session - 05/01/20 2052    Visit Number 1    Number of Visits 12    Date for PT Re-Evaluation 06/19/20    Authorization Type [employer]    PT Start Time 1115    PT Stop Time 1201    PT Time Calculation (min) 46 min    Activity Tolerance Patient tolerated treatment well    Behavior During Therapy [facility] for tasks assessed/performed           Past Medical History:  Diagnosis Date  . AKI (acute kidney injury) ([place])   . Allergy   . Anxiety   . Arthritis    Right shoulder  . Atrial fibrillation ([place])   . Carotid artery stenosis   . Cataract   . CHF (congestive heart failure) ([place])   . CVA (cerebral vascular accident) ([place]) 04/28/2016  . Decreased vision    left eye  . Diabetes mellitus without complication ([place])    Takes Metformin  . Hyperlipidemia   . Hypertension   . Salmonella bacteremia 04/29/2016  . Sepsis ([place]) 04/29/2016  . Stroke ([place]) 11/18/2015   no deficits  . TIA (transient ischemic attack)    affected left eye  . Urgency of urination     Past Surgical History:  Procedure Laterality Date  . AORTIC VALVE REPLACEMENT    . BACK SURGERY  80s  . CARDIAC VALVE REPLACEMENT  11/01/2008   21-mm Edwards Pericardial Magna - Ease valve  . ENDARTERECTOMY Right 01/13/2016   Procedure: ENDARTERECTOMY CAROTID - RIGHT;  Surgeon: Conrad [place], MD;  Location: [facility];  Service: Vascular;  Laterality: Right;  . EYE SURGERY    . laser surgery, left eye  Left 10-29-2015    retinal surgery/ Deloria Lair MD   . PERIPHERAL VASCULAR CATHETERIZATION Right 11/18/2015   Procedure: Carotid Angiography;  Surgeon: Conrad [place], MD;  Location: [facility];  Service: Cardiovascular;  Laterality: Right;  . PERIPHERAL VASCULAR CATHETERIZATION N/A 11/18/2015   Procedure: Aortic Arch Angiography;  Surgeon: Conrad [place], MD;  Location: [facility];  Service: Cardiovascular;  Laterality: N/A;  . TEE WITHOUT CARDIOVERSION N/A 05/05/2016   Procedure: TRANSESOPHAGEAL ECHOCARDIOGRAM (TEE);  Surgeon: Lelon Perla, MD;  Location: [facility] ENDOSCOPY;  Service: Cardiovascular;  Laterality: N/A;    There were no vitals filed for this visit.    Subjective Assessment - 05/01/20 2043    Subjective COVID-19 screening performed upon arrival. Patient arrives to physical therapy with decreased balance, frequent falls, and decreased LE strength secondary to a stroke in June 2021. Patient reports five falls in the past six months and wife reports he mainly falls backwards. Patient reports he needs assistance to stand after a fall. Patient reports ability to perform basic ADLs but with slow transitions and increased time to perform. Patient's wife reported having home health PT after his stroke but has not been consistent with performing HEP. Patient denies pain. Patient's goals are to improve movement, improve strength, improve ability to perform home tasks and improve balance to decrease falls.    Pertinent History HTN, DM, history of stroke, CHF, Chron's Disease, CKD, Heart valve replacement  Limitations House hold activities;Walking;Standing    Patient Stated Goals improve balance    Currently in Pain? No/denies              [facility] PT Assessment - 05/01/20 0001      Assessment   Medical Diagnosis Loss of Balance    Referring Provider (PT) [name], MD    Onset Date/Surgical Date --   Stroke June 2021   Next MD Visit n/a    Prior Therapy no      Precautions   Precautions Fall      Restrictions   Weight Bearing Restrictions No      Balance Screen   Has the patient fallen in the past 6 months Yes    How many times? 5    Has the  patient had a decrease in activity level because of a fear of falling?  Yes    Is the patient reluctant to leave their home because of a fear of falling?  Yes   "maybe"     [place] residence    Living Arrangements Spouse/significant other      Prior Function   Level of [place] with basic ADLs      ROM / Strength   AROM / PROM / Strength Strength      Strength   Strength Assessment Site Hip;Knee    Right/Left Hip Right;Left    Right Hip Flexion 4-/5    Right Hip ABduction 3+/5    Left Hip Flexion 4-/5    Left Hip ABduction 3+/5    Right/Left Knee Right;Left    Right Knee Flexion 4/5    Right Knee Extension 4/5    Left Knee Flexion 4/5    Left Knee Extension 4/5      Transfers   Comments slow transitional movements but proper hand placement for safety      Ambulation/Gait   Assistive device Small based quad cane    Gait Pattern Step-through pattern;Decreased stride length;Wide base of support      Standardized Balance Assessment   Standardized Balance Assessment Berg Balance Test      Berg Balance Test   Sit to Stand Able to stand  independently using hands    Standing Unsupported Able to stand 2 minutes with supervision    Sitting with Back Unsupported but Feet Supported on Floor or Stool Able to sit safely and securely 2 minutes    Stand to Sit Sits safely with minimal use of hands    Transfers Able to transfer safely, definite need of hands    Standing Unsupported with Eyes Closed Able to stand 10 seconds with supervision    Standing Unsupported with Feet Together Able to place feet together independently and stand for 1 minute with supervision    From Standing, Reach Forward with Outstretched Arm Can reach confidently >25 cm (10")    From Standing Position, Pick up Object from Floor Able to pick up shoe, needs supervision    From Standing Position, Turn to Look Behind Over each Shoulder Looks behind one side  only/other side shows less weight shift    Turn 360 Degrees Able to turn 360 degrees safely one side only in 4 seconds or less    Standing Unsupported, Alternately Place Feet on Step/Stool Able to complete >2 steps/needs minimal assist    Standing Unsupported, One Foot in Front Needs help to step but can hold 15 seconds    Standing on One  Leg Tries to lift leg/unable to hold 3 seconds but remains standing independently    Total Score 39                      Objective measurements completed on examination: See above findings.               PT Education - 05/01/20 2051    Education Details Heel raises, toe raises, hip abduction, pillow squeeze, marching all seated.    Person(s) Educated Patient;Spouse    Methods Explanation;Demonstration;Handout    Comprehension Verbalized understanding;Returned demonstration            PT Short Term Goals - 05/01/20 2059      PT SHORT TERM GOAL #1   Title STG=LTG             PT Long Term Goals - 05/01/20 2100      PT LONG TERM GOAL #1   Title Patient will be independent with HEP    Time 6    Period Weeks    Status New      PT LONG TERM GOAL #2   Title Patient will perform modified 5x sit to stand with UE support on LEs in 15 seconds or less to decrease risk of falls.    Baseline 17.9 seconds    Time 6    Period Weeks    Status New      PT LONG TERM GOAL #3   Title Patient will demonstrate a 5+ point improvement on Berg Balance Scale to decrease risk of falls.    Baseline 39/56    Time 6    Period Weeks    Status New      PT LONG TERM GOAL #4   Title Patient will demonstrate 4+/5  bilateral LE MMT to improve stability during functional tasks.    Time 6    Period Weeks    Status New                  Plan - 05/01/20 2053    Clinical Impression Statement Patient is a 79 year old male who presents to physical therapy with his wife with decreased LE MMT and decreased balance secondary to a stroke  in June 2021. Patient's wife is primary historian. Patient's modified 5x sit to stand with UEs on thighs time of 17.9 seconds categorizes him as a fall risk. Patient's Berg Balance Scale score of 39/56 categorizes him as mild fall risk. Patient ambulates with a SBQC with decreased stride lengths and wide BOS. Patient and PT discussed plan of care, HEP, and the importance of performing HEP to maximize PT benefit to which patient and patient's wife reported understanding. Patient would benefit from skilled physical therapy to address deficits and patient's goals.    Personal Factors and Comorbidities Age;Time since onset of injury/illness/exacerbation;Past/Current Experience;Comorbidity 3+    Comorbidities HTN, DM, history of stroke, CHF, Chron's Disease, CKD, Heart valve replacement    Examination-Activity Limitations Locomotion Level;Transfers;Stand    Examination-Participation Restrictions Community Activity    Stability/Clinical Decision Making Stable/Uncomplicated    Clinical Decision Making Low    Rehab Potential Fair    PT Frequency 2x / week    PT Duration 6 weeks    PT Treatment/Interventions ADLs/Self Care Home Management;Neuromuscular re-education;Balance training;Therapeutic exercise;Therapeutic activities;Functional mobility training;Stair training;Gait training;Patient/family education;Passive range of motion;Manual techniques    PT Next Visit Plan nustep, balance activities in various positions, LE strengthening and core strengthening  PT Home Exercise Plan see patient education section    Consulted and Agree with Plan of Care Patient           Patient will benefit from skilled therapeutic intervention in order to improve the following deficits and impairments:  Abnormal gait, Decreased activity tolerance, Decreased balance, Decreased strength, Decreased mobility, Decreased safety awareness, Difficulty walking  Visit Diagnosis: Repeated falls - Plan: PT plan of care  cert/re-cert  Unsteadiness on feet - Plan: PT plan of care cert/re-cert  Muscle weakness (generalized) - Plan: PT plan of care cert/re-cert     Problem List Patient Active Problem List   Diagnosis Date Noted  . Paroxysmal A-fib ([place]) 01/04/2020  . Gait disorder   . Stroke ([place]) 12/24/2019  . Posterior vitreous detachment of right eye 11/29/2019  . Branch retinal artery occlusion of left eye 11/29/2019  . Rubeosis iridis of left eye 11/29/2019  . Glaucoma associated with ocular disorder, left, severe stage 11/29/2019  . History of TIA (transient ischemic attack) 05/13/2019  . Coronary arteriosclerosis in native artery 12/12/2018  . Type 2 diabetes mellitus ([place]) 12/12/2018  . Right Carotid artery stenosis 11/03/2017  . Crohn's disease ([place]) 04/30/2016  . Stage 3 chronic kidney disease ([place]) 04/29/2016  . Late effects of cerebrovascular disease 04/03/2016  . History of aortic valve replacement 01/05/2016  . Left Carotid artery occlusion 11/18/2015  . Disorder of carotid artery ([place]) 11/18/2015  . Occlusion and stenosis of right carotid artery 10/26/2014  . Hyperlipidemia associated w/ type 2 DM goal <70 07/05/2013  . Hypertension associated with diabetes ([place]) 07/05/2013  . Hyperlipidemia, unspecified 07/05/2013    Gabriela Eves, PT, DPT 05/01/2020, 9:18 PM  [facility] [address] [place], [place], [address] Phone: [phone]   Fax:  [phone]  Name: Evan Stanley MRN: [id number] Date of Birth: Nov 08, 1940

## 2020-05-03 ENCOUNTER — Telehealth: Payer: Self-pay

## 2020-05-03 DIAGNOSIS — M25552 Pain in left hip: Secondary | ICD-10-CM | POA: Diagnosis not present

## 2020-05-03 DIAGNOSIS — S300XXA Contusion of lower back and pelvis, initial encounter: Secondary | ICD-10-CM | POA: Diagnosis not present

## 2020-05-03 NOTE — Telephone Encounter (Signed)
Pts wife called stating that Evan Stanley fell last wk and Dr Livia Snellen saw him and had xray done and showed no signs of broken hip but says Evan Stanley was complaining all night complaining about it and this morning it is very swollen and painful. Needs advise on what to do.

## 2020-05-03 NOTE — Telephone Encounter (Signed)
Patient is going to urgent care

## 2020-05-05 ENCOUNTER — Encounter: Payer: Self-pay | Admitting: Family Medicine

## 2020-05-05 ENCOUNTER — Other Ambulatory Visit: Payer: Self-pay

## 2020-05-05 ENCOUNTER — Emergency Department (HOSPITAL_COMMUNITY): Payer: Medicare Other

## 2020-05-05 ENCOUNTER — Inpatient Hospital Stay (HOSPITAL_COMMUNITY)
Admission: EM | Admit: 2020-05-05 | Discharge: 2020-05-09 | DRG: 682 | Disposition: A | Discharge status: Skilled Nursing Facility | Payer: Medicare Other | Attending: Family Medicine | Admitting: Family Medicine

## 2020-05-05 ENCOUNTER — Encounter (HOSPITAL_COMMUNITY): Payer: Self-pay | Admitting: Emergency Medicine

## 2020-05-05 DIAGNOSIS — I4891 Unspecified atrial fibrillation: Secondary | ICD-10-CM | POA: Diagnosis not present

## 2020-05-05 DIAGNOSIS — E1122 Type 2 diabetes mellitus with diabetic chronic kidney disease: Secondary | ICD-10-CM | POA: Diagnosis present

## 2020-05-05 DIAGNOSIS — W19XXXA Unspecified fall, initial encounter: Secondary | ICD-10-CM | POA: Diagnosis not present

## 2020-05-05 DIAGNOSIS — K5090 Crohn's disease, unspecified, without complications: Secondary | ICD-10-CM | POA: Diagnosis not present

## 2020-05-05 DIAGNOSIS — I517 Cardiomegaly: Secondary | ICD-10-CM | POA: Diagnosis not present

## 2020-05-05 DIAGNOSIS — Z87891 Personal history of nicotine dependence: Secondary | ICD-10-CM

## 2020-05-05 DIAGNOSIS — Z8249 Family history of ischemic heart disease and other diseases of the circulatory system: Secondary | ICD-10-CM

## 2020-05-05 DIAGNOSIS — E119 Type 2 diabetes mellitus without complications: Secondary | ICD-10-CM

## 2020-05-05 DIAGNOSIS — Z888 Allergy status to other drugs, medicaments and biological substances status: Secondary | ICD-10-CM

## 2020-05-05 DIAGNOSIS — G9341 Metabolic encephalopathy: Secondary | ICD-10-CM | POA: Diagnosis not present

## 2020-05-05 DIAGNOSIS — N183 Chronic kidney disease, stage 3 unspecified: Secondary | ICD-10-CM | POA: Diagnosis present

## 2020-05-05 DIAGNOSIS — H409 Unspecified glaucoma: Secondary | ICD-10-CM | POA: Diagnosis not present

## 2020-05-05 DIAGNOSIS — S300XXA Contusion of lower back and pelvis, initial encounter: Secondary | ICD-10-CM | POA: Diagnosis not present

## 2020-05-05 DIAGNOSIS — I130 Hypertensive heart and chronic kidney disease with heart failure and stage 1 through stage 4 chronic kidney disease, or unspecified chronic kidney disease: Secondary | ICD-10-CM | POA: Diagnosis not present

## 2020-05-05 DIAGNOSIS — E538 Deficiency of other specified B group vitamins: Secondary | ICD-10-CM | POA: Diagnosis present

## 2020-05-05 DIAGNOSIS — Z794 Long term (current) use of insulin: Secondary | ICD-10-CM

## 2020-05-05 DIAGNOSIS — M1612 Unilateral primary osteoarthritis, left hip: Secondary | ICD-10-CM | POA: Diagnosis not present

## 2020-05-05 DIAGNOSIS — R296 Repeated falls: Secondary | ICD-10-CM | POA: Diagnosis not present

## 2020-05-05 DIAGNOSIS — Z823 Family history of stroke: Secondary | ICD-10-CM

## 2020-05-05 DIAGNOSIS — E1140 Type 2 diabetes mellitus with diabetic neuropathy, unspecified: Secondary | ICD-10-CM | POA: Diagnosis present

## 2020-05-05 DIAGNOSIS — S7002XA Contusion of left hip, initial encounter: Secondary | ICD-10-CM

## 2020-05-05 DIAGNOSIS — Z743 Need for continuous supervision: Secondary | ICD-10-CM | POA: Diagnosis not present

## 2020-05-05 DIAGNOSIS — Z7901 Long term (current) use of anticoagulants: Secondary | ICD-10-CM | POA: Diagnosis not present

## 2020-05-05 DIAGNOSIS — Z8673 Personal history of transient ischemic attack (TIA), and cerebral infarction without residual deficits: Secondary | ICD-10-CM

## 2020-05-05 DIAGNOSIS — R404 Transient alteration of awareness: Secondary | ICD-10-CM | POA: Diagnosis not present

## 2020-05-05 DIAGNOSIS — R6889 Other general symptoms and signs: Secondary | ICD-10-CM | POA: Diagnosis not present

## 2020-05-05 DIAGNOSIS — Z953 Presence of xenogenic heart valve: Secondary | ICD-10-CM

## 2020-05-05 DIAGNOSIS — N1832 Chronic kidney disease, stage 3b: Secondary | ICD-10-CM | POA: Diagnosis not present

## 2020-05-05 DIAGNOSIS — E785 Hyperlipidemia, unspecified: Secondary | ICD-10-CM | POA: Diagnosis not present

## 2020-05-05 DIAGNOSIS — I959 Hypotension, unspecified: Secondary | ICD-10-CM | POA: Diagnosis not present

## 2020-05-05 DIAGNOSIS — Z20822 Contact with and (suspected) exposure to covid-19: Secondary | ICD-10-CM | POA: Diagnosis present

## 2020-05-05 DIAGNOSIS — N179 Acute kidney failure, unspecified: Secondary | ICD-10-CM | POA: Diagnosis not present

## 2020-05-05 DIAGNOSIS — I10 Essential (primary) hypertension: Secondary | ICD-10-CM | POA: Diagnosis not present

## 2020-05-05 DIAGNOSIS — R410 Disorientation, unspecified: Secondary | ICD-10-CM | POA: Diagnosis not present

## 2020-05-05 DIAGNOSIS — I509 Heart failure, unspecified: Secondary | ICD-10-CM | POA: Diagnosis not present

## 2020-05-05 DIAGNOSIS — Z833 Family history of diabetes mellitus: Secondary | ICD-10-CM

## 2020-05-05 DIAGNOSIS — F419 Anxiety disorder, unspecified: Secondary | ICD-10-CM | POA: Diagnosis present

## 2020-05-05 DIAGNOSIS — D500 Iron deficiency anemia secondary to blood loss (chronic): Secondary | ICD-10-CM | POA: Diagnosis not present

## 2020-05-05 DIAGNOSIS — M47816 Spondylosis without myelopathy or radiculopathy, lumbar region: Secondary | ICD-10-CM | POA: Diagnosis not present

## 2020-05-05 DIAGNOSIS — T148XXA Other injury of unspecified body region, initial encounter: Secondary | ICD-10-CM | POA: Diagnosis present

## 2020-05-05 DIAGNOSIS — Z9181 History of falling: Secondary | ICD-10-CM

## 2020-05-05 LAB — CBG MONITORING, ED: Glucose-Capillary: 127 mg/dL — ABNORMAL HIGH (ref 70–99)

## 2020-05-05 NOTE — ED Provider Notes (Signed)
MSE was initiated and I personally evaluated the patient and placed orders (if any) at  10:30 PM on May 05, 2020.  Patient presents via EMS because of AMS. Sounds like it's been all day but now "worse". He is currently pleasantly confused, but no focal deficits. Will order CT, labs. Not c/w acute stroke.   The patient appears stable so that the remainder of the MSE may be completed by another provider.   Sherwood Gambler, MD 05/05/20 2239

## 2020-05-05 NOTE — ED Triage Notes (Signed)
Pt brought in via EMS for AMS. Per family, pt fell a week ago and was evaluated and at baseline post fall. Today, pt had some AMS with gait disturbances that started around 11am and per EMS, has gotten worse "in the last two hours". Dr. Regenia Skeeter notified upon arrival.

## 2020-05-06 ENCOUNTER — Inpatient Hospital Stay (HOSPITAL_COMMUNITY): Payer: Medicare Other

## 2020-05-06 ENCOUNTER — Emergency Department (HOSPITAL_COMMUNITY): Payer: Medicare Other

## 2020-05-06 ENCOUNTER — Ambulatory Visit: Payer: Medicare Other | Admitting: Physical Therapy

## 2020-05-06 ENCOUNTER — Other Ambulatory Visit: Payer: Self-pay

## 2020-05-06 DIAGNOSIS — R262 Difficulty in walking, not elsewhere classified: Secondary | ICD-10-CM | POA: Diagnosis not present

## 2020-05-06 DIAGNOSIS — E1129 Type 2 diabetes mellitus with other diabetic kidney complication: Secondary | ICD-10-CM | POA: Diagnosis not present

## 2020-05-06 DIAGNOSIS — I959 Hypotension, unspecified: Secondary | ICD-10-CM | POA: Diagnosis present

## 2020-05-06 DIAGNOSIS — R296 Repeated falls: Secondary | ICD-10-CM | POA: Diagnosis present

## 2020-05-06 DIAGNOSIS — M1612 Unilateral primary osteoarthritis, left hip: Secondary | ICD-10-CM | POA: Diagnosis not present

## 2020-05-06 DIAGNOSIS — D500 Iron deficiency anemia secondary to blood loss (chronic): Secondary | ICD-10-CM | POA: Diagnosis not present

## 2020-05-06 DIAGNOSIS — N179 Acute kidney failure, unspecified: Secondary | ICD-10-CM | POA: Diagnosis not present

## 2020-05-06 DIAGNOSIS — Z823 Family history of stroke: Secondary | ICD-10-CM | POA: Diagnosis not present

## 2020-05-06 DIAGNOSIS — I517 Cardiomegaly: Secondary | ICD-10-CM | POA: Diagnosis not present

## 2020-05-06 DIAGNOSIS — I130 Hypertensive heart and chronic kidney disease with heart failure and stage 1 through stage 4 chronic kidney disease, or unspecified chronic kidney disease: Secondary | ICD-10-CM | POA: Diagnosis not present

## 2020-05-06 DIAGNOSIS — I5022 Chronic systolic (congestive) heart failure: Secondary | ICD-10-CM | POA: Diagnosis not present

## 2020-05-06 DIAGNOSIS — S7002XA Contusion of left hip, initial encounter: Secondary | ICD-10-CM | POA: Diagnosis not present

## 2020-05-06 DIAGNOSIS — S300XXA Contusion of lower back and pelvis, initial encounter: Secondary | ICD-10-CM | POA: Diagnosis present

## 2020-05-06 DIAGNOSIS — I69998 Other sequelae following unspecified cerebrovascular disease: Secondary | ICD-10-CM | POA: Diagnosis not present

## 2020-05-06 DIAGNOSIS — I509 Heart failure, unspecified: Secondary | ICD-10-CM | POA: Diagnosis present

## 2020-05-06 DIAGNOSIS — W19XXXA Unspecified fall, initial encounter: Secondary | ICD-10-CM | POA: Diagnosis not present

## 2020-05-06 DIAGNOSIS — F419 Anxiety disorder, unspecified: Secondary | ICD-10-CM | POA: Diagnosis present

## 2020-05-06 DIAGNOSIS — M47816 Spondylosis without myelopathy or radiculopathy, lumbar region: Secondary | ICD-10-CM | POA: Diagnosis not present

## 2020-05-06 DIAGNOSIS — K5090 Crohn's disease, unspecified, without complications: Secondary | ICD-10-CM | POA: Diagnosis present

## 2020-05-06 DIAGNOSIS — I639 Cerebral infarction, unspecified: Secondary | ICD-10-CM | POA: Diagnosis not present

## 2020-05-06 DIAGNOSIS — I10 Essential (primary) hypertension: Secondary | ICD-10-CM | POA: Diagnosis not present

## 2020-05-06 DIAGNOSIS — G9341 Metabolic encephalopathy: Secondary | ICD-10-CM | POA: Diagnosis present

## 2020-05-06 DIAGNOSIS — H409 Unspecified glaucoma: Secondary | ICD-10-CM | POA: Diagnosis present

## 2020-05-06 DIAGNOSIS — Z833 Family history of diabetes mellitus: Secondary | ICD-10-CM | POA: Diagnosis not present

## 2020-05-06 DIAGNOSIS — R410 Disorientation, unspecified: Secondary | ICD-10-CM | POA: Diagnosis not present

## 2020-05-06 DIAGNOSIS — M7981 Nontraumatic hematoma of soft tissue: Secondary | ICD-10-CM | POA: Diagnosis not present

## 2020-05-06 DIAGNOSIS — Z8249 Family history of ischemic heart disease and other diseases of the circulatory system: Secondary | ICD-10-CM | POA: Diagnosis not present

## 2020-05-06 DIAGNOSIS — E1122 Type 2 diabetes mellitus with diabetic chronic kidney disease: Secondary | ICD-10-CM | POA: Diagnosis present

## 2020-05-06 DIAGNOSIS — I4891 Unspecified atrial fibrillation: Secondary | ICD-10-CM | POA: Diagnosis not present

## 2020-05-06 DIAGNOSIS — E1140 Type 2 diabetes mellitus with diabetic neuropathy, unspecified: Secondary | ICD-10-CM | POA: Diagnosis present

## 2020-05-06 DIAGNOSIS — Z7901 Long term (current) use of anticoagulants: Secondary | ICD-10-CM | POA: Diagnosis not present

## 2020-05-06 DIAGNOSIS — E785 Hyperlipidemia, unspecified: Secondary | ICD-10-CM | POA: Diagnosis not present

## 2020-05-06 DIAGNOSIS — Z87891 Personal history of nicotine dependence: Secondary | ICD-10-CM | POA: Diagnosis not present

## 2020-05-06 DIAGNOSIS — Z9181 History of falling: Secondary | ICD-10-CM | POA: Diagnosis not present

## 2020-05-06 DIAGNOSIS — N1832 Chronic kidney disease, stage 3b: Secondary | ICD-10-CM | POA: Diagnosis not present

## 2020-05-06 DIAGNOSIS — E538 Deficiency of other specified B group vitamins: Secondary | ICD-10-CM | POA: Diagnosis present

## 2020-05-06 DIAGNOSIS — Z952 Presence of prosthetic heart valve: Secondary | ICD-10-CM | POA: Diagnosis not present

## 2020-05-06 DIAGNOSIS — Z741 Need for assistance with personal care: Secondary | ICD-10-CM | POA: Diagnosis not present

## 2020-05-06 DIAGNOSIS — E1143 Type 2 diabetes mellitus with diabetic autonomic (poly)neuropathy: Secondary | ICD-10-CM | POA: Diagnosis not present

## 2020-05-06 DIAGNOSIS — Z20822 Contact with and (suspected) exposure to covid-19: Secondary | ICD-10-CM | POA: Diagnosis present

## 2020-05-06 DIAGNOSIS — S8012XA Contusion of left lower leg, initial encounter: Secondary | ICD-10-CM | POA: Diagnosis not present

## 2020-05-06 DIAGNOSIS — M6281 Muscle weakness (generalized): Secondary | ICD-10-CM | POA: Diagnosis not present

## 2020-05-06 LAB — CBC WITH DIFFERENTIAL/PLATELET
Abs Immature Granulocytes: 0.04 10*3/uL (ref 0.00–0.07)
Basophils Absolute: 0.1 10*3/uL (ref 0.0–0.1)
Basophils Relative: 1 %
Eosinophils Absolute: 0.2 10*3/uL (ref 0.0–0.5)
Eosinophils Relative: 2 %
HCT: 29.2 % — ABNORMAL LOW (ref 39.0–52.0)
Hemoglobin: 9.7 g/dL — ABNORMAL LOW (ref 13.0–17.0)
Immature Granulocytes: 0 %
Lymphocytes Relative: 15 %
Lymphs Abs: 1.5 10*3/uL (ref 0.7–4.0)
MCH: 28 pg (ref 26.0–34.0)
MCHC: 33.2 g/dL (ref 30.0–36.0)
MCV: 84.1 fL (ref 80.0–100.0)
Monocytes Absolute: 1.2 10*3/uL — ABNORMAL HIGH (ref 0.1–1.0)
Monocytes Relative: 12 %
Neutro Abs: 7.3 10*3/uL (ref 1.7–7.7)
Neutrophils Relative %: 70 %
Platelets: 170 10*3/uL (ref 150–400)
RBC: 3.47 MIL/uL — ABNORMAL LOW (ref 4.22–5.81)
RDW: 13.7 % (ref 11.5–15.5)
WBC: 10.3 10*3/uL (ref 4.0–10.5)
nRBC: 0 % (ref 0.0–0.2)

## 2020-05-06 LAB — CBC
HCT: 30.9 % — ABNORMAL LOW (ref 39.0–52.0)
HCT: 29.5 % — ABNORMAL LOW (ref 39.0–52.0)
Hemoglobin: 10.2 g/dL — ABNORMAL LOW (ref 13.0–17.0)
Hemoglobin: 9.8 g/dL — ABNORMAL LOW (ref 13.0–17.0)
MCH: 28.3 pg (ref 26.0–34.0)
MCH: 28.2 pg (ref 26.0–34.0)
MCHC: 33 g/dL (ref 30.0–36.0)
MCHC: 33.2 g/dL (ref 30.0–36.0)
MCV: 85.8 fL (ref 80.0–100.0)
MCV: 84.8 fL (ref 80.0–100.0)
Platelets: 169 10*3/uL (ref 150–400)
Platelets: 164 10*3/uL (ref 150–400)
RBC: 3.6 MIL/uL — ABNORMAL LOW (ref 4.22–5.81)
RBC: 3.48 MIL/uL — ABNORMAL LOW (ref 4.22–5.81)
RDW: 13.9 % (ref 11.5–15.5)
RDW: 14 % (ref 11.5–15.5)
WBC: 11.9 10*3/uL — ABNORMAL HIGH (ref 4.0–10.5)
WBC: 11.2 10*3/uL — ABNORMAL HIGH (ref 4.0–10.5)
nRBC: 0 % (ref 0.0–0.2)
nRBC: 0 % (ref 0.0–0.2)

## 2020-05-06 LAB — CBG MONITORING, ED
Glucose-Capillary: 70 mg/dL (ref 70–99)
Glucose-Capillary: 74 mg/dL (ref 70–99)
Glucose-Capillary: 63 mg/dL — ABNORMAL LOW (ref 70–99)
Glucose-Capillary: 93 mg/dL (ref 70–99)

## 2020-05-06 LAB — COMPREHENSIVE METABOLIC PANEL
ALT: 16 U/L (ref 0–44)
ALT: 17 U/L (ref 0–44)
AST: 20 U/L (ref 15–41)
AST: 21 U/L (ref 15–41)
Albumin: 3.3 g/dL — ABNORMAL LOW (ref 3.5–5.0)
Albumin: 3 g/dL — ABNORMAL LOW (ref 3.5–5.0)
Alkaline Phosphatase: 57 U/L (ref 38–126)
Alkaline Phosphatase: 53 U/L (ref 38–126)
Anion gap: 12 (ref 5–15)
Anion gap: 11 (ref 5–15)
BUN: 66 mg/dL — ABNORMAL HIGH (ref 8–23)
BUN: 62 mg/dL — ABNORMAL HIGH (ref 8–23)
CO2: 22 mmol/L (ref 22–32)
CO2: 25 mmol/L (ref 22–32)
Calcium: 8.5 mg/dL — ABNORMAL LOW (ref 8.9–10.3)
Calcium: 8.3 mg/dL — ABNORMAL LOW (ref 8.9–10.3)
Chloride: 102 mmol/L (ref 98–111)
Chloride: 103 mmol/L (ref 98–111)
Creatinine, Ser: 3.39 mg/dL — ABNORMAL HIGH (ref 0.61–1.24)
Creatinine, Ser: 2.8 mg/dL — ABNORMAL HIGH (ref 0.61–1.24)
GFR, Estimated: 17 mL/min — ABNORMAL LOW (ref >60)
GFR, Estimated: 21 mL/min — ABNORMAL LOW (ref >60)
Glucose, Bld: 102 mg/dL — ABNORMAL HIGH (ref 70–99)
Glucose, Bld: 86 mg/dL (ref 70–99)
Potassium: 3.8 mmol/L (ref 3.5–5.1)
Potassium: 3.8 mmol/L (ref 3.5–5.1)
Sodium: 136 mmol/L (ref 135–145)
Sodium: 139 mmol/L (ref 135–145)
Total Bilirubin: 1.5 mg/dL — ABNORMAL HIGH (ref 0.3–1.2)
Total Bilirubin: 1.9 mg/dL — ABNORMAL HIGH (ref 0.3–1.2)
Total Protein: 5.8 g/dL — ABNORMAL LOW (ref 6.5–8.1)
Total Protein: 5.5 g/dL — ABNORMAL LOW (ref 6.5–8.1)

## 2020-05-06 LAB — RAPID URINE DRUG SCREEN, HOSP PERFORMED
Amphetamines: NOT DETECTED
Barbiturates: NOT DETECTED
Benzodiazepines: NOT DETECTED
Cocaine: NOT DETECTED
Opiates: POSITIVE — AB
Tetrahydrocannabinol: NOT DETECTED

## 2020-05-06 LAB — RESPIRATORY PANEL BY RT PCR (FLU A&B, COVID)
Influenza A by PCR: NEGATIVE
Influenza B by PCR: NEGATIVE
SARS Coronavirus 2 by RT PCR: NEGATIVE

## 2020-05-06 LAB — URINALYSIS, ROUTINE W REFLEX MICROSCOPIC
Bilirubin Urine: NEGATIVE
Glucose, UA: NEGATIVE mg/dL
Hgb urine dipstick: NEGATIVE
Ketones, ur: NEGATIVE mg/dL
Leukocytes,Ua: NEGATIVE
Nitrite: NEGATIVE
Protein, ur: NEGATIVE mg/dL
Specific Gravity, Urine: 1.018 (ref 1.005–1.030)
pH: 5 (ref 5.0–8.0)

## 2020-05-06 LAB — TSH: TSH: 1.036 u[IU]/mL (ref 0.350–4.500)

## 2020-05-06 LAB — FOLATE: Folate: 11.3 ng/mL (ref >5.9)

## 2020-05-06 LAB — SALICYLATE LEVEL: Salicylate Lvl: <7 mg/dL — ABNORMAL LOW (ref 7.0–30.0)

## 2020-05-06 LAB — GLUCOSE, CAPILLARY
Glucose-Capillary: 91 mg/dL (ref 70–99)
Glucose-Capillary: 101 mg/dL — ABNORMAL HIGH (ref 70–99)

## 2020-05-06 LAB — ACETAMINOPHEN LEVEL: Acetaminophen (Tylenol), Serum: <10 ug/mL — ABNORMAL LOW (ref 10–30)

## 2020-05-06 LAB — VITAMIN B12: Vitamin B-12: 87 pg/mL — ABNORMAL LOW (ref 180–914)

## 2020-05-06 LAB — MAGNESIUM: Magnesium: 2 mg/dL (ref 1.7–2.4)

## 2020-05-06 MED ORDER — DEXTROSE 50 % IV SOLN
INTRAVENOUS | Status: AC
  Filled 2020-05-06: qty 50

## 2020-05-06 MED ORDER — INSULIN ASPART 100 UNIT/ML ~~LOC~~ SOLN
0.0000 [IU] | Freq: Three times a day (TID) | SUBCUTANEOUS | Status: DC
  Administered 2020-05-07 – 2020-05-08 (×4): 2 [IU] via SUBCUTANEOUS
  Administered 2020-05-08 – 2020-05-09 (×2): 1 [IU] via SUBCUTANEOUS

## 2020-05-06 MED ORDER — ONDANSETRON HCL 4 MG PO TABS: 4.0000 mg | ORAL_TABLET | Freq: Four times a day (QID) | ORAL | Status: DC | PRN

## 2020-05-06 MED ORDER — DEXTROSE 50 % IV SOLN
12.5000 g | INTRAVENOUS | Status: AC
  Administered 2020-05-06: 12.5 g via INTRAVENOUS

## 2020-05-06 MED ORDER — SODIUM CHLORIDE 0.9 % IV SOLN: INTRAVENOUS | Status: DC

## 2020-05-06 MED ORDER — PIPERACILLIN-TAZOBACTAM IN DEX 2-0.25 GM/50ML IV SOLN
2.2500 g | Freq: Three times a day (TID) | INTRAVENOUS | Status: DC
  Administered 2020-05-06: 2.25 g via INTRAVENOUS
  Filled 2020-05-06 (×6): qty 50

## 2020-05-06 MED ORDER — LATANOPROST 0.005 % OP SOLN
1.0000 [drp] | Freq: Every day | OPHTHALMIC | Status: DC
  Administered 2020-05-07 – 2020-05-08 (×2): 1 [drp] via OPHTHALMIC
  Filled 2020-05-06: qty 2.5

## 2020-05-06 MED ORDER — ATORVASTATIN CALCIUM 40 MG PO TABS
40.0000 mg | ORAL_TABLET | Freq: Every day | ORAL | Status: DC
  Administered 2020-05-07 – 2020-05-09 (×3): 40 mg via ORAL
  Filled 2020-05-06 (×4): qty 1

## 2020-05-06 MED ORDER — LACTATED RINGERS IV BOLUS
500.0000 mL | Freq: Once | INTRAVENOUS | Status: AC
  Administered 2020-05-06: 500 mL via INTRAVENOUS

## 2020-05-06 MED ORDER — SODIUM CHLORIDE 0.9 % IV BOLUS
500.0000 mL | Freq: Once | INTRAVENOUS | Status: AC
  Administered 2020-05-06: 500 mL via INTRAVENOUS

## 2020-05-06 MED ORDER — ACETAMINOPHEN 650 MG RE SUPP: 650.0000 mg | Freq: Four times a day (QID) | RECTAL | Status: DC | PRN

## 2020-05-06 MED ORDER — CYANOCOBALAMIN 1000 MCG/ML IJ SOLN
1000.0000 ug | Freq: Every day | INTRAMUSCULAR | Status: DC
  Administered 2020-05-07 – 2020-05-09 (×3): 1000 ug via INTRAMUSCULAR
  Filled 2020-05-06 (×3): qty 1

## 2020-05-06 MED ORDER — PIPERACILLIN-TAZOBACTAM 3.375 G IVPB
3.3750 g | Freq: Two times a day (BID) | INTRAVENOUS | Status: DC
  Administered 2020-05-06 – 2020-05-07 (×2): 3.375 g via INTRAVENOUS
  Filled 2020-05-06 (×2): qty 50

## 2020-05-06 MED ORDER — INSULIN DETEMIR 100 UNIT/ML ~~LOC~~ SOLN
40.0000 [IU] | Freq: Every day | SUBCUTANEOUS | Status: DC
  Filled 2020-05-06: qty 0.4

## 2020-05-06 MED ORDER — ONDANSETRON HCL 4 MG/2ML IJ SOLN: 4.0000 mg | Freq: Four times a day (QID) | INTRAMUSCULAR | Status: DC | PRN

## 2020-05-06 MED ORDER — ACETAMINOPHEN 325 MG PO TABS
650.0000 mg | ORAL_TABLET | Freq: Four times a day (QID) | ORAL | Status: DC | PRN
  Administered 2020-05-08 – 2020-05-09 (×2): 650 mg via ORAL
  Filled 2020-05-06 (×2): qty 2

## 2020-05-06 MED ORDER — INSULIN ASPART 100 UNIT/ML ~~LOC~~ SOLN: 0.0000 [IU] | Freq: Three times a day (TID) | SUBCUTANEOUS | Status: DC

## 2020-05-06 MED ORDER — APIXABAN 5 MG PO TABS
5.0000 mg | ORAL_TABLET | Freq: Two times a day (BID) | ORAL | Status: DC
  Filled 2020-05-06: qty 1

## 2020-05-06 NOTE — H&P (Signed)
TRH H&P    Patient Demographics:    Evan Stanley, is a 79 y.o. male  MRN: [id number]  DOB - [birth year]  Admit Date - 05/05/2020  Referring MD/NP/PA: Christy Gentles  Outpatient Primary MD for the patient is [name], DO  Patient coming from: Home  Chief complaint- Altered mental status   HPI:    Evan Stanley  is a 79 y.o. male, with history of CVA, hypertension, hyperlipidemia, diabetes mellitus type 2, CHF, A. fib presents to the ED with a chief complaint of altered mental status.  Patient is not able to provide any history.  He does not know why he is here.  He can tell that he he is at a doctor, but does not know the place or time.  Apparently wife had been at bedside at an earlier hour and had discussed with the other provider that patient had been falling, and fell on left hip.  He was seen in outside facility and prescribed Norco.  He started having confusion after he was prescribed Norco.  Wife reports that she has not given him Norco in the last 24 hours.  Patient at baseline ambulates with walker or cane, and is alert and oriented x3.  Patient reports that he is in no pain at this time.  In the ED Temperature 98.3, heart rate 90, respiratory rate 13, blood pressure 110/48, satting 100% White blood cell count is 11.9, hemoglobin 10.2 CHEM panel reveals AKI with a BUN of 66 and a creatinine of 3.39 Albumin 3.3 Covid and flu negative Chest x-ray shows peribronchial thickening with streaky perihilar opacities possibly representing early viral pneumonia EKG shows a heart rate of 85 QTC of 437 sinus rhythm Hip x-ray is negative UA is pending CT head shows no acute intracranial abnormalities. Bolused 500 mL     Review of systems:    Review of Systems  Unable to perform ROS: Mental status change       Past History of the following :    Past Medical History:  Diagnosis Date  . AKI (acute  kidney injury) ([place])   . Allergy   . Anxiety   . Arthritis    Right shoulder  . Atrial fibrillation ([place])   . Carotid artery stenosis   . Cataract   . CHF (congestive heart failure) ([place])   . CVA (cerebral vascular accident) ([place]) 04/28/2016  . Decreased vision    left eye  . Diabetes mellitus without complication ([place])    Takes Metformin  . Hyperlipidemia   . Hypertension   . Salmonella bacteremia 04/29/2016  . Sepsis ([place]) 04/29/2016  . Stroke ([place]) 11/18/2015   no deficits  . TIA (transient ischemic attack)    affected left eye  . Urgency of urination       Past Surgical History:  Procedure Laterality Date  . AORTIC VALVE REPLACEMENT    . BACK SURGERY  80s  . CARDIAC VALVE REPLACEMENT  11/01/2008   21-mm Edwards Pericardial Magna - Ease valve  . ENDARTERECTOMY Right 01/13/2016   Procedure:  ENDARTERECTOMY CAROTID - RIGHT;  Surgeon: Conrad Elliott, MD;  Location: [facility];  Service: Vascular;  Laterality: Right;  . EYE SURGERY    . laser surgery, left eye  Left 10-29-2015    retinal surgery/ Deloria Lair MD   . PERIPHERAL VASCULAR CATHETERIZATION Right 11/18/2015   Procedure: Carotid Angiography;  Surgeon: Conrad Coburg, MD;  Location: [facility];  Service: Cardiovascular;  Laterality: Right;  . PERIPHERAL VASCULAR CATHETERIZATION N/A 11/18/2015   Procedure: Aortic Arch Angiography;  Surgeon: Conrad , MD;  Location: [facility];  Service: Cardiovascular;  Laterality: N/A;  . TEE WITHOUT CARDIOVERSION N/A 05/05/2016   Procedure: TRANSESOPHAGEAL ECHOCARDIOGRAM (TEE);  Surgeon: Lelon Perla, MD;  Location: [facility] ENDOSCOPY;  Service: Cardiovascular;  Laterality: N/A;      Social History:      Social History   Tobacco Use  . Smoking status: Former Smoker    Packs/day: 2.00    Years: 20.00    Pack years: 40.00    Types: Cigarettes    Quit date: 07/20/1986    Years since quitting: 33.8  . Smokeless tobacco: Never Used  Substance Use Topics  . Alcohol use:  No    Alcohol/week: 0.0 standard drinks       Family History :     Family History  Problem Relation Age of Onset  . Heart disease Mother   . Cancer Mother        breast  . Stroke Father 30  . Hypertension Father   . Early death Sister        infant death  . Heart disease Brother   . Hydrocephalus Brother   . Heart disease Brother   . Diabetes Brother   . Cancer Brother        prostat  . Healthy Son   . Healthy Son       Home Medications:   Prior to Admission medications   Medication Sig Start Date End Date Taking? Authorizing Provider  apixaban (ELIQUIS) 5 MG TABS tablet Take 1 tablet (5 mg total) by mouth 2 (two) times daily. 04/15/20   [name], DO  atorvastatin (LIPITOR) 40 MG tablet TAKE 1 TABLET BY MOUTH  DAILY 04/19/20   [name], DO  atropine 1 % ophthalmic solution Place 1 drop into the left eye 3 (three) times daily as needed (eye pain).    [provider]  cetirizine (ZYRTEC) 10 MG tablet Take 10 mg by mouth daily.    [provider]  diclofenac Sodium (VOLTAREN) 1 % GEL Apply 2 g topically 4 (four) times daily. Patient not taking: Reported on 04/25/2020 03/07/20   Dettinger, Fransisca Kaufmann, MD  Dulaglutide (TRULICITY) 1.5 FG/1.8EX SOPN 1.5 04/10/20   [name], MD  Dulaglutide (TRULICITY) 1.5 HB/7.1IR SOPN INJECT THE CONTENTS OF 1  PEN (1.5MG) INTO THE SKIN  WEEKLY AS DIRECTED . 04/23/20   [name], MD  hydrochlorothiazide (HYDRODIURIL) 25 MG tablet TAKE 1 TABLET BY MOUTH  DAILY 04/19/20   [name], DO  insulin degludec (TRESIBA FLEXTOUCH) 200 UNIT/ML FlexTouch Pen Inject 50 Units into the skin daily. 04/25/20   [name], MD  insulin lispro (HUMALOG) 100 UNIT/ML injection Inject 6 Units into the skin daily before supper.     [provider]  Insulin Pen Needle (B-D UF III MINI PEN NEEDLES) 31G X 5 MM MISC USE 4 PENNEEDLES DAILY TO  INJECT INSULIN Dx E11.9 01/01/20   [name],  Ashly M, DO  Lancets  (ONETOUCH DELICA PLUS [id number]) MISC Test BS BID Dx E11.9 12/28/19   [name], DO  latanoprost (XALATAN) 0.005 % ophthalmic solution Place 1 drop into the left eye at bedtime.     [provider]  lisinopril (ZESTRIL) 20 MG tablet Take 1 tablet (20 mg total) by mouth daily. 03/13/20   [name], MD  metoprolol succinate (TOPROL-XL) 100 MG 24 hr tablet TAKE 1 TABLET BY MOUTH  DAILY WITH OR IMMEDIATELY  FOLLOWING A MEAL 04/19/20   Gottschalk, Anderson, DO  ONETOUCH ULTRA test strip Test BS BID Dx E11.9 12/28/19   [name], DO  vitamin C (ASCORBIC ACID) 500 MG tablet Take 500 mg by mouth daily.    [provider]  vitamin E (VITAMIN E) 1000 UNIT capsule Take 1,000 Units by mouth daily.    [provider]     Allergies:     Allergies  Allergen Reactions  . Baclofen Other (See Comments)    Stroke like symptoms     Physical Exam:   Vitals  Blood pressure (!) 110/48, pulse 90, temperature 98.3 F (36.8 C), temperature source Rectal, resp. rate 13, height 5' 5"  (1.651 m), weight 75 kg, SpO2 100 %.  1.  General: Lying supine in bed in no acute distress  2. Psychiatric: Oriented to context and person, but not place or time  3. Neurologic: Cranial nerves II through XII are intact tact patient is following commands, moves all 4 extremities voluntarily has difficulty understanding finger-to-nose test  4. HEENMT:  Head is atraumatic, normocephalic, pupils reactive to light, poor dentition, neck is supple, trachea is midline  5. Respiratory : Lungs are clear to auscultation bilaterally  6. Cardiovascular : Heart rate is normal, rhythm is regular, systolic murmur, no rubs or gallops, no peripheral edema  7. Gastrointestinal:  Abdomen is soft, nondistended, nontender to palpation no masses palpated  8. Skin:  Bruise on left hip Dry skin lower right extremity No other acute lesions on limited exam  9.Musculoskeletal:  No peripheral  edema no acute deformity    Data Review:    CBC Recent Labs  Lab 05/06/20 0021  WBC 11.9*  HGB 10.2*  HCT 30.9*  PLT 169  MCV 85.8  MCH 28.3  MCHC 33.0  RDW 13.9   ------------------------------------------------------------------------------------------------------------------  Results for orders placed or performed during the hospital encounter of 05/05/20 (from the past 48 hour(s))  CBG monitoring, ED     Status: Abnormal   Collection Time: 05/05/20 10:11 PM  Result Value Ref Range   Glucose-Capillary 127 (H) 70 - 99 mg/dL    Comment: Glucose reference range applies only to samples taken after fasting for at least 8 hours.  Comprehensive metabolic panel     Status: Abnormal   Collection Time: 05/06/20 12:21 AM  Result Value Ref Range   Sodium 136 135 - 145 mmol/L   Potassium 3.8 3.5 - 5.1 mmol/L   Chloride 102 98 - 111 mmol/L   CO2 22 22 - 32 mmol/L   Glucose, Bld 102 (H) 70 - 99 mg/dL    Comment: Glucose reference range applies only to samples taken after fasting for at least 8 hours.   BUN 66 (H) 8 - 23 mg/dL   Creatinine, Ser 3.39 (H) 0.61 - 1.24 mg/dL   Calcium 8.5 (L) 8.9 - 10.3 mg/dL   Total Protein 5.8 (L) 6.5 - 8.1 g/dL   Albumin 3.3 (L) 3.5 - 5.0  g/dL   AST 20 15 - 41 U/L   ALT 16 0 - 44 U/L   Alkaline Phosphatase 57 38 - 126 U/L   Total Bilirubin 1.5 (H) 0.3 - 1.2 mg/dL   GFR, Estimated 17 (L) >60 mL/min   Anion gap 12 5 - 15    Comment: Performed at [facility], [address], [place], [place] [address]  CBC     Status: Abnormal   Collection Time: 05/06/20 12:21 AM  Result Value Ref Range   WBC 11.9 (H) 4.0 - 10.5 K/uL   RBC 3.60 (L) 4.22 - 5.81 MIL/uL   Hemoglobin 10.2 (L) 13.0 - 17.0 g/dL   HCT 30.9 (L) 39 - 52 %   MCV 85.8 80.0 - 100.0 fL   MCH 28.3 26.0 - 34.0 pg   MCHC 33.0 30.0 - 36.0 g/dL   RDW 13.9 11.5 - 15.5 %   Platelets 169 150 - 400 K/uL   nRBC 0.0 0.0 - 0.2 %    Comment: Performed at [facility], [address],  [place], [place] [address]  Respiratory Panel by RT PCR (Flu A&B, Covid) - Nasopharyngeal Swab     Status: None   Collection Time: 05/06/20  2:27 AM   Specimen: Nasopharyngeal Swab  Result Value Ref Range   SARS Coronavirus 2 by RT PCR NEGATIVE NEGATIVE    Comment: (NOTE) SARS-CoV-2 target nucleic acids are NOT DETECTED.  The SARS-CoV-2 RNA is generally detectable in upper respiratoy specimens during the acute phase of infection. The lowest concentration of SARS-CoV-2 viral copies this assay can detect is 131 copies/mL. A negative result does not preclude SARS-Cov-2 infection and should not be used as the sole basis for treatment or other patient management decisions. A negative result may occur with  improper specimen collection/handling, submission of specimen other than nasopharyngeal swab, presence of viral mutation(s) within the areas targeted by this assay, and inadequate number of viral copies (<131 copies/mL). A negative result must be combined with clinical observations, patient history, and epidemiological information. The expected result is Negative.  Fact Sheet for Patients:  PinkCheek.be  Fact Sheet for Healthcare Providers:  GravelBags.it  This test is no t yet approved or cleared by the [place] FDA and  has been authorized for detection and/or diagnosis of SARS-CoV-2 by FDA under an Emergency Use Authorization (EUA). This EUA will remain  in effect (meaning this test can be used) for the duration of the COVID-19 declaration under Section 564(b)(1) of the Act, 21 U.S.C. section 360bbb-3(b)(1), unless the authorization is terminated or revoked sooner.     Influenza A by PCR NEGATIVE NEGATIVE   Influenza B by PCR NEGATIVE NEGATIVE    Comment: (NOTE) The Xpert Xpress SARS-CoV-2/FLU/RSV assay is intended as an aid in  the diagnosis of influenza from Nasopharyngeal swab specimens and  should not be used as a  sole basis for treatment. Nasal washings and  aspirates are unacceptable for Xpert Xpress SARS-CoV-2/FLU/RSV  testing.  Fact Sheet for Patients: PinkCheek.be  Fact Sheet for Healthcare Providers: GravelBags.it  This test is not yet approved or cleared by the [place] FDA and  has been authorized for detection and/or diagnosis of SARS-CoV-2 by  FDA under an Emergency Use Authorization (EUA). This EUA will remain  in effect (meaning this test can be used) for the duration of the  Covid-19 declaration under Section 564(b)(1) of the Act, 21  U.S.C. section 360bbb-3(b)(1), unless the authorization is  terminated or revoked. Performed at  [facility], [address], [place], [place] [address]     Chemistries  Recent Labs  Lab 05/06/20 0021  NA 136  K 3.8  CL 102  CO2 22  GLUCOSE 102*  BUN 66*  CREATININE 3.39*  CALCIUM 8.5*  AST 20  ALT 16  ALKPHOS 57  BILITOT 1.5*   ------------------------------------------------------------------------------------------------------------------  ------------------------------------------------------------------------------------------------------------------ GFR: Estimated Creatinine Clearance: 17.3 mL/min (A) (by C-G formula based on SCr of 3.39 mg/dL (H)). Liver Function Tests: Recent Labs  Lab 05/06/20 0021  AST 20  ALT 16  ALKPHOS 57  BILITOT 1.5*  PROT 5.8*  ALBUMIN 3.3*   No results for input(s): LIPASE, AMYLASE in the last 168 hours. No results for input(s): AMMONIA in the last 168 hours. Coagulation Profile: No results for input(s): INR, PROTIME in the last 168 hours. Cardiac Enzymes: No results for input(s): CKTOTAL, CKMB, CKMBINDEX, TROPONINI in the last 168 hours. BNP (last 3 results) No results for input(s): PROBNP in the last 8760 hours. HbA1C: No results for input(s): HGBA1C in the last 72 hours. CBG: Recent Labs  Lab 05/05/20 2211  GLUCAP 127*    Lipid Profile: No results for input(s): CHOL, HDL, LDLCALC, TRIG, CHOLHDL, LDLDIRECT in the last 72 hours. Thyroid Function Tests: No results for input(s): TSH, T4TOTAL, FREET4, T3FREE, THYROIDAB in the last 72 hours. Anemia Panel: No results for input(s): VITAMINB12, FOLATE, FERRITIN, TIBC, IRON, RETICCTPCT in the last 72 hours.  --------------------------------------------------------------------------------------------------------------- Urine analysis:    Component Value Date/Time   COLORURINE YELLOW 03/11/2020 2151   APPEARANCEUR CLEAR 03/11/2020 2151   LABSPEC 1.020 03/11/2020 2151   PHURINE 5.0 03/11/2020 2151   GLUCOSEU NEGATIVE 03/11/2020 2151   HGBUR NEGATIVE 03/11/2020 2151   BILIRUBINUR NEGATIVE 03/11/2020 2151   [place] NEGATIVE 03/11/2020 2151   PROTEINUR NEGATIVE 03/11/2020 2151   UROBILINOGEN >8.0 (H) 10/30/2008 2328   NITRITE NEGATIVE 03/11/2020 2151   LEUKOCYTESUR NEGATIVE 03/11/2020 2151      Imaging Results:    CT Head Wo Contrast  Result Date: 05/05/2020 CLINICAL DATA:  Acute onset altered mental status and gait disturbances at 11 a.m. today. EXAM: CT HEAD WITHOUT CONTRAST TECHNIQUE: Contiguous axial images were obtained from the base of the skull through the vertex without intravenous contrast. COMPARISON:  CT head 03/11/2020.  MRI brain 03/11/2020. FINDINGS: Brain: Diffuse cerebral atrophy. Ventricular dilatation likely due to central atrophy. Low-attenuation changes in the deep white matter consistent with small vessel ischemia. No mass-effect or midline shift. No abnormal extra-axial fluid collections. Gray-white matter junctions are distinct. Basal cisterns are not effaced. No acute intracranial hemorrhage. Vascular: Moderate intracranial arterial calcifications are present. Skull: Calvarium appears intact. Sinuses/Orbits: Paranasal sinuses and mastoid air cells are clear. Other: No significant change since prior study. IMPRESSION: 1. No acute  intracranial abnormalities. 2. Chronic atrophy and small vessel ischemia. Electronically Signed   By: Lucienne Capers M.D.   On: 05/05/2020 22:54   DG Chest Port 1 View  Result Date: 05/06/2020 CLINICAL DATA:  Confusion.  Gait disturbance. EXAM: PORTABLE CHEST 1 VIEW COMPARISON:  03/11/2020. FINDINGS: Postoperative changes in the mediastinum. Shallow inspiration. Mild cardiac enlargement. Peribronchial thickening with streaky perihilar opacities possibly representing early viral pneumonia. No focal consolidation. No pleural effusions. No pneumothorax. Calcification of the aorta. Degenerative changes in the shoulders. IMPRESSION: Mild cardiac enlargement. Peribronchial thickening with streaky perihilar opacities possibly representing early viral pneumonia. Electronically Signed   By: Lucienne Capers M.D.   On: 05/06/2020 01:52   DG Hip Port Unilat W or Wo  Pelvis 1 View Left  Result Date: 05/06/2020 CLINICAL DATA:  79 year old male with fall and left hip pain. EXAM: DG HIP (WITH OR WITHOUT PELVIS) 1V PORT LEFT COMPARISON:  Left hip radiograph dated 04/25/2020. FINDINGS: There is no acute fracture or dislocation. The bones are osteopenic. Bilateral hip osteoarthritic changes. There is degenerative changes of the lower lumbar spine. The soft tissues are unremarkable. IMPRESSION: Negative. Electronically Signed   By: Anner Crete M.D.   On: 05/06/2020 03:50    My personal review of EKG: Rhythm NSR, Rate 85 /min, QTc 437 ,no Acute ST changes   Assessment & Plan:    Active Problems:   Metabolic encephalopathy   1. Metabolic encephalopathy 1. Recently started on norco, medications reaction?  2. UDS 3. TSH 4. Possible infection with border line WBC, and hypotension 5. UA, blood cultures pending 6. CT w/o acute findings 7. CXR with no acute findings 8. Start zosyn while infection is ruled in/out 2. AKI 1. Baseline Cr 1.5 - 2.0 2. Today Cr 3.39 3. BUN 66 4. Gentle IV hydration 5. Hold  hydrochlorothiazide and lisinopril 6. Trend in the a.m. 3. Multiple Falls 1. Multiple falls at home prior to altered mental status 2. Had reported left hip pain, x-ray left hip is negative 3. Consult PT 4. Hypotension 1. Systolic blood pressure as low as 70s 2. Improved to 90 after 500 mL bolus, and then went back down to the 70s 3. Another 500 mL bolus ordered, infusion to follow bolus 4. Hold metoprolol, lisinopril, hydrochlorothiazide 5. DMII 1. Long-acting insulin 2. Sliding scale coverage 3. Carb modified diet   DVT Prophylaxis-   Eliquis - SCDs   AM Labs Ordered, also please review Full Orders  Family Communication: No family at bedside  Code Status:  Full  Admission status: Inpatient :The appropriate admission status for this patient is INPATIENT. Inpatient status is judged to be reasonable and necessary in order to provide the required intensity of service to ensure the patient's safety. The patient's presenting symptoms, physical exam findings, and initial radiographic and laboratory data in the context of their chronic comorbidities is felt to place them at high risk for further clinical deterioration. Furthermore, it is not anticipated that the patient will be medically stable for discharge from the hospital within 2 midnights of admission. The following factors support the admission status of inpatient.     The patient's presenting symptoms include altered mental status The worrisome physical exam findings include Hypotension with SBP dropping down to 70s The initial radiographic and laboratory data are worrisome because of AKI . The chronic co-morbidities include DMII, Hx CVA, Afib, HTN, HLD       * I certify that at the point of admission it is my clinical judgment that the patient will require inpatient hospital care spanning beyond 2 midnights from the point of admission due to high intensity of service, high risk for further deterioration and high frequency of  surveillance required.*  Time spent in minutes : [place]

## 2020-05-06 NOTE — Evaluation (Signed)
Physical Therapy Evaluation Patient Details Name: Evan Stanley MRN: [id number] DOB: [birth year] Today's Date: 05/06/2020   History of Present Illness  Evan Stanley  is a 79 y.o. male, with history of CVA, hypertension, hyperlipidemia, diabetes mellitus type 2, CHF, A. fib presents to the ED with a chief complaint of altered mental status.  Patient is not able to provide any history.  He does not know why he is here.  He can tell that he he is at a doctor, but does not know the place or time.  Apparently wife had been at bedside at an earlier hour and had discussed with the other provider that patient had been falling, and fell on left hip.  He was seen in outside facility and prescribed Norco.  He started having confusion after he was prescribed Norco.  Wife reports that she has not given him Norco in the last 24 hours.  Patient at baseline ambulates with walker or cane, and is alert and oriented x3.  Patient reports that he is in no pain at this time.    Clinical Impression  Patient slow to respond to questions possibly due to [facility], required much time with Mod assist to sit up at bedside, at high risk for falls and limited to a few steps at bedside due to weakness and c/o fatigue.  Patient tolerated sitting up in chair after therapy with his spouse present in room - RN aware.  Patient will benefit from continued physical therapy in hospital and recommended venue below to increase strength, balance, endurance for safe ADLs and gait.     Follow Up Recommendations SNF    Equipment Recommendations  None recommended by PT    Recommendations for Other Services       Precautions / Restrictions Precautions Precautions: Fall Restrictions Weight Bearing Restrictions: No      Mobility  Bed Mobility Overal bed mobility: Needs Assistance Bed Mobility: Supine to Sit     Supine to sit: Mod assist     General bed mobility comments: increased time, labored movement  Transfers Overall transfer  level: Needs assistance   Transfers: Sit to/from Stand;Stand Pivot Transfers Sit to Stand: Min assist;Mod assist Stand pivot transfers: Min assist;Mod assist       General transfer comment: unsteady on feet, increased time  Ambulation/Gait Ambulation/Gait assistance: Mod assist Gait Distance (Feet): 10 Feet Assistive device: Rolling walker (2 wheeled) Gait Pattern/deviations: Decreased step length - right;Decreased step length - left;Decreased stride length Gait velocity: decreased   General Gait Details: limited to slow labored steps at bedside with occasional buckling of knees due to weakness, limited secondary to c/o fatigue  Stairs            Wheelchair Mobility    Modified Rankin (Stroke Patients Only)       Balance Overall balance assessment: Needs assistance Sitting-balance support: Feet supported;No upper extremity supported Sitting balance-Leahy Scale: Fair Sitting balance - Comments: fair/good seated at bedside   Standing balance support: During functional activity;Bilateral upper extremity supported Standing balance-Leahy Scale: Fair Standing balance comment: using RW                             Pertinent Vitals/Pain Pain Assessment: Faces Faces Pain Scale: Hurts a little bit Pain Location: over left buttocks at site of bruise Pain Descriptors / Indicators: Sore;Discomfort Pain Intervention(s): Limited activity within patient's tolerance;Monitored during session    Home Living Family/patient expects to be discharged  to:: Private residence Living Arrangements: Spouse/significant other Available Help at Discharge: Family;Available 24 hours/day Type of Home: House Home Access: Stairs to enter Entrance Stairs-Rails: None Entrance Stairs-Number of Steps: 1 Home Layout: Laundry or work area in [place]: Environmental consultant - 2 wheels;Cane - single point;Shower seat;Bedside commode      Prior Function Level of Independence: Independent  with assistive device(s)         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[employer], drives     Hand Dominance   Dominant Hand: Right    Extremity/Trunk Assessment   Upper Extremity Assessment Upper Extremity Assessment: Generalized weakness    Lower Extremity Assessment Lower Extremity Assessment: Generalized weakness    Cervical / Trunk Assessment Cervical / Trunk Assessment: Normal  Communication   Communication: HOH  Cognition Arousal/Alertness: Awake/alert Behavior During Therapy: WFL for tasks assessed/performed Overall Cognitive Status: Within Functional Limits for tasks assessed                                        General Comments      Exercises     Assessment/Plan    PT Assessment Patient needs continued PT services  PT Problem List Decreased strength;Decreased activity tolerance;Decreased balance;Decreased mobility       PT Treatment Interventions Balance training;Gait training;DME instruction;Stair training;Functional mobility training;Therapeutic activities;Therapeutic exercise;Patient/family education    PT Goals (Current goals can be found in the Care Plan section)  Acute Rehab PT Goals Patient Stated Goal: return home with family to assist PT Goal Formulation: With patient/family Time For Goal Achievement: 05/20/20 Potential to Achieve Goals: Good    Frequency Min 3X/week   Barriers to discharge        Co-evaluation               AM-PAC PT "6 Clicks" Mobility  Outcome Measure Help needed turning from your back to your side while in a flat bed without using bedrails?: A Lot Help needed moving from lying on your back to sitting on the side of a flat bed without using bedrails?: A Lot Help needed moving to and from a bed to a chair (including a wheelchair)?: A Lot Help needed standing up from a chair using your arms (e.g., wheelchair or bedside chair)?: A Little Help needed to walk in hospital room?: A Lot Help needed  climbing 3-5 steps with a railing? : A Lot 6 Click Score: 13    End of Session   Activity Tolerance: Patient tolerated treatment well;Patient limited by fatigue Patient left: in chair;with call bell/phone within reach;with family/visitor present Nurse Communication: Mobility status PT Visit Diagnosis: Unsteadiness on feet (R26.81);Other abnormalities of gait and mobility (R26.89);Muscle weakness (generalized) (M62.81)    Time: 1510-1540 PT Time Calculation (min) (ACUTE ONLY): 30 min   Charges:   PT Evaluation $PT Eval Moderate Complexity: 1 Mod PT Treatments $Therapeutic Activity: 23-37 mins        3:58 PM, 05/06/20 Lonell Grandchild, MPT Physical Therapist with [facility] [phone] office [phone] mobile phone

## 2020-05-06 NOTE — ED Provider Notes (Signed)
[facility] EMERGENCY DEPARTMENT Provider Note   CSN: [id number] Arrival date & time: 05/05/20  2146     History Chief Complaint  Patient presents with  . Altered Mental Status  Level 5 caveat due to altered mental status  Evan Stanley is a 79 y.o. male.  The history is provided by the patient and the spouse. The history is limited by the condition of the patient.  Altered Mental Status Presenting symptoms: confusion and disorientation   Timing:  Intermittent Progression:  Worsening Chronicity:  New Associated symptoms: no fever    Patient presents via EMS for altered mental status Unclear time of onset Pt denies any complaints No other details known on arrival     Past Medical History:  Diagnosis Date  . AKI (acute kidney injury) ([place])   . Allergy   . Anxiety   . Arthritis    Right shoulder  . Atrial fibrillation ([place])   . Carotid artery stenosis   . Cataract   . CHF (congestive heart failure) ([place])   . CVA (cerebral vascular accident) ([place]) 04/28/2016  . Decreased vision    left eye  . Diabetes mellitus without complication ([place])    Takes Metformin  . Hyperlipidemia   . Hypertension   . Salmonella bacteremia 04/29/2016  . Sepsis ([place]) 04/29/2016  . Stroke ([place]) 11/18/2015   no deficits  . TIA (transient ischemic attack)    affected left eye  . Urgency of urination     Patient Active Problem List   Diagnosis Date Noted  . Paroxysmal A-fib ([place]) 01/04/2020  . Gait disorder   . Stroke ([place]) 12/24/2019  . Posterior vitreous detachment of right eye 11/29/2019  . [place] retinal artery occlusion of left eye 11/29/2019  . Rubeosis iridis of left eye 11/29/2019  . Glaucoma associated with ocular disorder, left, severe stage 11/29/2019  . History of TIA (transient ischemic attack) 05/13/2019  . Coronary arteriosclerosis in native artery 12/12/2018  . Type 2 diabetes mellitus ([place]) 12/12/2018  . Right Carotid artery stenosis 11/03/2017  . Crohn's disease  ([place]) 04/30/2016  . Stage 3 chronic kidney disease ([place]) 04/29/2016  . Late effects of cerebrovascular disease 04/03/2016  . History of aortic valve replacement 01/05/2016  . Left Carotid artery occlusion 11/18/2015  . Disorder of carotid artery ([place]) 11/18/2015  . Occlusion and stenosis of right carotid artery 10/26/2014  . Hyperlipidemia associated w/ type 2 DM goal <70 07/05/2013  . Hypertension associated with diabetes ([place]) 07/05/2013  . Hyperlipidemia, unspecified 07/05/2013    Past Surgical History:  Procedure Laterality Date  . AORTIC VALVE REPLACEMENT    . BACK SURGERY  80s  . CARDIAC VALVE REPLACEMENT  11/01/2008   21-mm Edwards Pericardial Magna - Ease valve  . ENDARTERECTOMY Right 01/13/2016   Procedure: ENDARTERECTOMY CAROTID - RIGHT;  Surgeon: Conrad Piedmont, MD;  Location: [facility];  Service: Vascular;  Laterality: Right;  . EYE SURGERY    . laser surgery, left eye  Left 10-29-2015    retinal surgery/ Deloria Lair MD   . PERIPHERAL VASCULAR CATHETERIZATION Right 11/18/2015   Procedure: Carotid Angiography;  Surgeon: Conrad Bellflower, MD;  Location: [facility];  Service: Cardiovascular;  Laterality: Right;  . PERIPHERAL VASCULAR CATHETERIZATION N/A 11/18/2015   Procedure: Aortic Arch Angiography;  Surgeon: Conrad Old Field, MD;  Location: [facility];  Service: Cardiovascular;  Laterality: N/A;  . TEE WITHOUT CARDIOVERSION N/A 05/05/2016   Procedure: TRANSESOPHAGEAL ECHOCARDIOGRAM (TEE);  Surgeon: Aaron Edelman  Jacalyn Lefevre, MD;  Location: [facility];  Service: Cardiovascular;  Laterality: N/A;       Family History  Problem Relation Age of Onset  . Heart disease Mother   . Cancer Mother        breast  . Stroke Father 104  . Hypertension Father   . Early death Sister        infant death  . Heart disease Brother   . Hydrocephalus Brother   . Heart disease Brother   . Diabetes Brother   . Cancer Brother        prostat  . Healthy Son   . Healthy Son     Social History    Tobacco Use  . Smoking status: Former Smoker    Packs/day: 2.00    Years: 20.00    Pack years: 40.00    Types: Cigarettes    Quit date: 07/20/1986    Years since quitting: 33.8  . Smokeless tobacco: Never Used  Vaping Use  . Vaping Use: Never used  Substance Use Topics  . Alcohol use: No    Alcohol/week: 0.0 standard drinks  . Drug use: No    Home Medications Prior to Admission medications   Medication Sig Start Date End Date Taking? Authorizing Provider  apixaban (ELIQUIS) 5 MG TABS tablet Take 1 tablet (5 mg total) by mouth 2 (two) times daily. 04/15/20   [name], DO  atorvastatin (LIPITOR) 40 MG tablet TAKE 1 TABLET BY MOUTH  DAILY 04/19/20   [name], DO  atropine 1 % ophthalmic solution Place 1 drop into the left eye 3 (three) times daily as needed (eye pain).    [provider]  cetirizine (ZYRTEC) 10 MG tablet Take 10 mg by mouth daily.    [provider]  diclofenac Sodium (VOLTAREN) 1 % GEL Apply 2 g topically 4 (four) times daily. Patient not taking: Reported on 04/25/2020 03/07/20   Dettinger, Fransisca Kaufmann, MD  Dulaglutide (TRULICITY) 1.5 WR/6.0AV SOPN 1.5 04/10/20   [name], MD  Dulaglutide (TRULICITY) 1.5 WU/9.8JX SOPN INJECT THE CONTENTS OF 1  PEN (1.5MG) INTO THE SKIN  WEEKLY AS DIRECTED . 04/23/20   [name], MD  hydrochlorothiazide (HYDRODIURIL) 25 MG tablet TAKE 1 TABLET BY MOUTH  DAILY 04/19/20   [name], DO  insulin degludec (TRESIBA FLEXTOUCH) 200 UNIT/ML FlexTouch Pen Inject 50 Units into the skin daily. 04/25/20   [name], MD  insulin lispro (HUMALOG) 100 UNIT/ML injection Inject 6 Units into the skin daily before supper.     [provider]  Insulin Pen Needle (B-D UF III MINI PEN NEEDLES) 31G X 5 MM MISC USE 4 PENNEEDLES DAILY TO  INJECT INSULIN Dx E11.9 01/01/20   [name], DO  Lancets (ONETOUCH DELICA PLUS [id number]) MISC Test BS BID Dx E11.9 12/28/19   [name], DO   latanoprost (XALATAN) 0.005 % ophthalmic solution Place 1 drop into the left eye at bedtime.     [provider]  lisinopril (ZESTRIL) 20 MG tablet Take 1 tablet (20 mg total) by mouth daily. 03/13/20   [name], MD  metoprolol succinate (TOPROL-XL) 100 MG 24 hr tablet TAKE 1 TABLET BY MOUTH  DAILY WITH OR IMMEDIATELY  FOLLOWING A MEAL 04/19/20   Gottschalk, [place], DO  ONETOUCH ULTRA test strip Test BS BID Dx E11.9 12/28/19   [name], DO  vitamin C (ASCORBIC ACID) 500 MG tablet Take 500 mg by mouth  daily.    [provider]  vitamin E (VITAMIN E) 1000 UNIT capsule Take 1,000 Units by mouth daily.    [provider]    Allergies    Baclofen  Review of Systems   Review of Systems  Unable to perform ROS: Mental status change  Constitutional: Negative for fever.  Psychiatric/Behavioral: Positive for confusion.    Physical Exam Updated Vital Signs BP (!) 97/48   Pulse 81   Temp 98.3 F (36.8 C) (Rectal)   Resp 18   Ht 1.651 m (5' 5" )   Wt 75 kg   SpO2 93%   BMI 27.51 kg/m   Physical Exam CONSTITUTIONAL: Elderly, no acute distress HEAD: Normocephalic/atraumatic EYES: EOMI/PERRL ENMT: Mucous membranes moist NECK: supple no meningeal signs SPINE/BACK:entire spine nontender CV: S1/S2 noted, no murmurs/rubs/gallops noted LUNGS: Lungs are clear to auscultation bilaterally, no apparent distress ABDOMEN: soft, nontender GU:no cva tenderness NEURO: Pt is awake/alert but he appears confused.  He will follow commands but does not answer all questions appropriately.  He is easily distractible.  He moves all extremities x4, no obvious facial droop EXTREMITIES: pulses normal/equal, full ROM, bruising noted to left hip and tenderness with range of motion.  Pelvis stable.  All other extremities/joints palpated/ranged and nontender SKIN: warm, color normal PSYCH: Unable to fully assess  ED Results / Procedures / Treatments   Labs (all labs  ordered are listed, but only abnormal results are displayed) Labs Reviewed  COMPREHENSIVE METABOLIC PANEL - Abnormal; Notable for the following components:      Result Value   Glucose, Bld 102 (*)    BUN 66 (*)    Creatinine, Ser 3.39 (*)    Calcium 8.5 (*)    Total Protein 5.8 (*)    Albumin 3.3 (*)    Total Bilirubin 1.5 (*)    GFR, Estimated 17 (*)    All other components within normal limits  CBC - Abnormal; Notable for the following components:   WBC 11.9 (*)    RBC 3.60 (*)    Hemoglobin 10.2 (*)    HCT 30.9 (*)    All other components within normal limits  CBG MONITORING, ED - Abnormal; Notable for the following components:   Glucose-Capillary 127 (*)    All other components within normal limits  RESPIRATORY PANEL BY RT PCR (FLU A&B, COVID)  URINALYSIS, ROUTINE W REFLEX MICROSCOPIC  CBG MONITORING, ED    EKG EKG Interpretation  Date/Time:  Monday May 06 2020 00:34:59 EDT Ventricular Rate:  85 PR Interval:  180 QRS Duration: 74 QT Interval:  368 QTC Calculation: 437 R Axis:   59 Text Interpretation: Normal sinus rhythm Abnormal ECG No significant change since last tracing Confirmed by Ripley Fraise [phone]) on 05/06/2020 12:43:59 AM   Radiology CT Head Wo Contrast  Result Date: 05/05/2020 CLINICAL DATA:  Acute onset altered mental status and gait disturbances at 11 a.m. today. EXAM: CT HEAD WITHOUT CONTRAST TECHNIQUE: Contiguous axial images were obtained from the base of the skull through the vertex without intravenous contrast. COMPARISON:  CT head 03/11/2020.  MRI brain 03/11/2020. FINDINGS: Brain: Diffuse cerebral atrophy. Ventricular dilatation likely due to central atrophy. Low-attenuation changes in the deep white matter consistent with small vessel ischemia. No mass-effect or midline shift. No abnormal extra-axial fluid collections. Gray-white matter junctions are distinct. Basal cisterns are not effaced. No acute intracranial hemorrhage. Vascular:  Moderate intracranial arterial calcifications are present. Skull: Calvarium appears intact. Sinuses/Orbits: Paranasal sinuses and mastoid air cells  are clear. Other: No significant change since prior study. IMPRESSION: 1. No acute intracranial abnormalities. 2. Chronic atrophy and small vessel ischemia. Electronically Signed   By: Lucienne Capers M.D.   On: 05/05/2020 22:54   DG Chest Port 1 View  Result Date: 05/06/2020 CLINICAL DATA:  Confusion.  Gait disturbance. EXAM: PORTABLE CHEST 1 VIEW COMPARISON:  03/11/2020. FINDINGS: Postoperative changes in the mediastinum. Shallow inspiration. Mild cardiac enlargement. Peribronchial thickening with streaky perihilar opacities possibly representing early viral pneumonia. No focal consolidation. No pleural effusions. No pneumothorax. Calcification of the aorta. Degenerative changes in the shoulders. IMPRESSION: Mild cardiac enlargement. Peribronchial thickening with streaky perihilar opacities possibly representing early viral pneumonia. Electronically Signed   By: Lucienne Capers M.D.   On: 05/06/2020 01:52   DG Hip Port Unilat W or Wo Pelvis 1 View Left  Result Date: 05/06/2020 CLINICAL DATA:  79 year old male with fall and left hip pain. EXAM: DG HIP (WITH OR WITHOUT PELVIS) 1V PORT LEFT COMPARISON:  Left hip radiograph dated 04/25/2020. FINDINGS: There is no acute fracture or dislocation. The bones are osteopenic. Bilateral hip osteoarthritic changes. There is degenerative changes of the lower lumbar spine. The soft tissues are unremarkable. IMPRESSION: Negative. Electronically Signed   By: Anner Crete M.D.   On: 05/06/2020 03:50    Procedures Procedures    Medications Ordered in ED Medications  lactated ringers bolus 500 mL (500 mLs Intravenous New Bag/Given 05/06/20 0244)    ED Course  I have reviewed the triage vital signs and the nursing notes.  Pertinent labs & imaging results that were available during my care of the patient were  reviewed by me and considered in my medical decision making (see chart for details).    MDM Rules/Calculators/A&P                          3:31 AM Patient initially seen for altered mental status but there was no family at bedside.  Patient was unable to contribute to history. His wife arrived and reports the patient's had increasing confusion recently.  He has had increased falls recently and had a contusion to his left hip.  He was seen at another facility and was placed on pain medications.  Since starting hydrocodone, his symptoms of confusion have worsened.  He can typically ambulate with some assistance and is more coherent, so this is an acute change in his mental status  No acute findings on the CT head. However he does have acute renal failure.  This could be contributing to his confusion as well as the pain medications Patient does have significant bruising to left hip, with tenderness to range of motion.  Will obtain x-ray of this area and then admit him to the hospital 4:34 AM Hip xray is negative BP (!) 110/48   Pulse 90   Temp 98.3 F (36.8 C) (Rectal)   Resp 13   Ht 1.651 m (5' 5" )   Wt 75 kg   SpO2 100%   BMI 27.51 kg/m  Will admit for delirum/metabolic encephalopathy D/w Dr Josph Macho with triad hospitalist for admission  Final Clinical Impression(s) / ED Diagnoses Final diagnoses:  Metabolic encephalopathy  AKI (acute kidney injury) ([place])  Contusion of left hip, initial encounter    Rx / DC Orders ED Discharge Orders    None       Ripley Fraise, MD 05/06/20 [phone]

## 2020-05-06 NOTE — Care Plan (Addendum)
[facility]  PROGRESS NOTE  [name] VXB:[id number] DOB: 03/19/1941 DOA: 05/05/2020 PCP: [name], DO Admit date - 05/05/2020   Admitting Physician Rolla Plate, DO  Outpatient Primary MD for the patient is [name], DO  LOS - 0 Brief Narrative  [name] is a 79 year old male with medical history significant to but not limited by CVA, HTN, type 2 diabetes, A. fib, CHF presented to the ED on 10/18 with family reporting worsening confusion and difficulty walking after a fall about a week ago.  Patient was admitted under the working diagnosis of acute metabolic encephalopathy in the setting of opioid use for left hip pain complicated by AKI, hypotension.    Subjective  Today he is able to state that he has some pain in his left hip, able to say his name but does not know where he is.  A & P    Acute encephalopathy, suspect metabolic from opiate use, as well as B12 deficiency, and likely also related to pain.  Other work-up showed TSH within normal limits, Tylenol, salicylate levels UA UDS unremarkable (other than positive for opioids).  CT head nonacute.  Patient has no focal deficits and is able to follow commands but other than answering simple yes/no questions has nonsensical language.  Doubt infection -Replete vitamin B12 -Avoid sedative medications -Delirium precautions -If no noted improvement with supportive care may warrant MRI brain -Currently on IV Zosyn for possible infection, will check pro-Cal if negative will discontinue  Symptomatic, Vit B12 deficiency. Folate wnl.  Likely explains poor gait disturbances causing falls.  Has new anemia but likely related to hematoma addressed more below. Unclear why he is deficient--wife is vegetarian but states he eats meat, no bariatric or abdominal surgery, no chronic history of malnutrition, no Etoh abuse -Replete IV B12 1060mg x 7 days  Left gluteal muscle hematoma.  Patient presented with  recurrent falls and patient is on Eliquis.  Had notable swelling in left hip gluteus muscle, initial x-ray unremarkable.  CT hip consistent with posttraumatic hematoma. -Currently no evidence of compartment syndrome though limited by pain, continue serial exams -Continue to monitor CBC -If hemoglobin continues to drop may need to discontinue or hold Eliquis  Acute anemia in the setting of posttraumatic gluteal muscle hematoma.  Occurred in the setting of Eliquis use.  Otherwise blood pressure relatively stable at 110-120/50 6-58.  Hemoglobin 10, previous baseline 16. -Trend CBC every 8h -Monitor blood pressure -Hold home Eliquis  AKI on CKD stage IIIb.  Baseline creatinine 1.4-1.9, presented with creatinine of 3.39, already down trended, currently 2.8 with BUN of 62.  Could be prerenal given some relative hypotension, status post LR bolus,  Currently on 75 cc IV fluids -Holding home lisinopril -Monitor output, repeat BMP in a.m.  Atrial fibrillation, currently rate controlled. Has history of CVA but risk of continuing eliquis and worsening hematoma/anemia outweighs benefit currently -Hold home Toprol given somewhat soft blood pressures -Monitor on telemetry -Discontinue Eliquis in the setting of hematoma, discussed risk vs benefits with wife who is agreeable with plan  HTN.  Actually running a bit soft blood pressures with SBP 110-120s. -Hold home HCTZ, lisinopril  Type 2 diabetes, well controlled A1c 6.4.  Fasting blood sugars in 70s to 90s.  Likely because patient is pretty confused not eating much -Hold home Trulicity, Tresiba 15 units -Monitor CBG, sliding scale as needed until mental status improves and p.o. intake increases  Recurrent falls at home that preceded current ultimately  status.  Could be related to vitamin B12 deficiency which could have resulted in changes in his gait -PT consulted     Family Communication  : updated wife at bedside on 10/18  Code Status : Full  code as discussed on day of admission  Disposition Plan  :  Patient is from home. Anticipated d/c date: 2 to 3 days. Barriers to d/c or necessity for inpatient status:  Patient required IV fluids for AKI, close monitoring of hemoglobin given hematoma while on Eliquis, improvement in mental status needed to persistent confusion, IV B12 repletion given gait disturbances and recurrent falls, close monitoring of blood pressure given hypotension Consults  : None  Procedures  : None  DVT Prophylaxis  : Holding Eliquis  MDM: Per H&P from earlier today  Lab Results  Component Value Date   PLT 170 05/06/2020    Diet :  Diet Order            Diet heart healthy/carb modified Room service appropriate? Yes; Fluid consistency: Thin  Diet effective now                  Inpatient Medications Scheduled Meds: . apixaban  5 mg Oral BID  . atorvastatin  40 mg Oral Daily  . insulin aspart  0-15 Units Subcutaneous TID WC  . insulin detemir  40 Units Subcutaneous QHS   Continuous Infusions: . sodium chloride 75 mL/hr at 05/06/20 0544  . piperacillin-tazobactam (ZOSYN)  IV     PRN Meds:.acetaminophen **OR** acetaminophen, ondansetron **OR** ondansetron (ZOFRAN) IV  Antibiotics  :   Anti-infectives (From admission, onward)   Start     Dose/Rate Route Frequency Ordered Stop   05/06/20 1700  piperacillin-tazobactam (ZOSYN) IVPB 3.375 g        3.375 g 12.5 mL/hr over 240 Minutes Intravenous Every 12 hours 05/06/20 0922     05/06/20 0600  piperacillin-tazobactam (ZOSYN) IVPB 2.25 g  Status:  Discontinued        2.25 g 100 mL/hr over 30 Minutes Intravenous Every 8 hours 05/06/20 0515 05/06/20 0922       Objective   Vitals:   05/06/20 0730 05/06/20 0800 05/06/20 0930 05/06/20 1000  BP: (!) 107/57 (!) 123/56  (!) 111/58  Pulse: 88 83 77 88  Resp: 20 20 18 16   Temp:      TempSrc:      SpO2: 100% 100% 100% 100%  Weight:      Height:        SpO2: 100 %  Wt Readings from Last 3  Encounters:  05/05/20 75 kg  04/25/20 74.8 kg  04/10/20 75.8 kg     Intake/Output Summary (Last 24 hours) at 05/06/2020 1114 Last data filed at 05/06/2020 0515 Gross per 24 hour  Intake 500 ml  Output --  Net 500 ml    Physical Exam:     Awake alert, oriented to self only, still able to follow commands with no focal deficits .AT, Normal respiratory effort on room air, CTAB RRR,No Gallops,Rubs or new Murmurs,  +ve B.Sounds, Abd Soft, No tenderness, No rebound, guarding or rigidity. Left hip/gluteal area with notable swelling, bruising, no appreciable tenderness, difficulty moving left leg related to pain, pulses intact, warm extremities  I have personally reviewed the following:   Data Reviewed:  CBC Recent Labs  Lab 05/06/20 0021 05/06/20 0826  WBC 11.9* 10.3  HGB 10.2* 9.7*  HCT 30.9* 29.2*  PLT 169 170  MCV 85.8 84.1  MCH  28.3 28.0  MCHC 33.0 33.2  RDW 13.9 13.7  LYMPHSABS  --  1.5  MONOABS  --  1.2*  EOSABS  --  0.2  BASOSABS  --  0.1    Chemistries  Recent Labs  Lab 05/06/20 0021 05/06/20 0826  NA 136 139  K 3.8 3.8  CL 102 103  CO2 22 25  GLUCOSE 102* 86  BUN 66* 62*  CREATININE 3.39* 2.80*  CALCIUM 8.5* 8.3*  MG  --  2.0  AST 20 21  ALT 16 17  ALKPHOS 57 53  BILITOT 1.5* 1.9*   ------------------------------------------------------------------------------------------------------------------ No results for input(s): CHOL, HDL, LDLCALC, TRIG, CHOLHDL, LDLDIRECT in the last 72 hours.  Lab Results  Component Value Date   HGBA1C 6.4 (A) 04/10/2020   ------------------------------------------------------------------------------------------------------------------ Recent Labs    05/06/20 0826  TSH 1.036   ------------------------------------------------------------------------------------------------------------------ Recent Labs    05/06/20 0826  VITAMINB12 87*  FOLATE 11.3    Coagulation profile No results for input(s):  INR, PROTIME in the last 168 hours.  No results for input(s): DDIMER in the last 72 hours.  Cardiac Enzymes No results for input(s): CKMB, TROPONINI, MYOGLOBIN in the last 168 hours.  Invalid input(s): CK ------------------------------------------------------------------------------------------------------------------ No results found for: BNP  Micro Results Recent Results (from the past 240 hour(s))  Respiratory Panel by RT PCR (Flu A&B, Covid) - Nasopharyngeal Swab     Status: None   Collection Time: 05/06/20  2:27 AM   Specimen: Nasopharyngeal Swab  Result Value Ref Range Status   SARS Coronavirus 2 by RT PCR NEGATIVE NEGATIVE Final    Comment: (NOTE) SARS-CoV-2 target nucleic acids are NOT DETECTED.  The SARS-CoV-2 RNA is generally detectable in upper respiratoy specimens during the acute phase of infection. The lowest concentration of SARS-CoV-2 viral copies this assay can detect is 131 copies/mL. A negative result does not preclude SARS-Cov-2 infection and should not be used as the sole basis for treatment or other patient management decisions. A negative result may occur with  improper specimen collection/handling, submission of specimen other than nasopharyngeal swab, presence of viral mutation(s) within the areas targeted by this assay, and inadequate number of viral copies (<131 copies/mL). A negative result must be combined with clinical observations, patient history, and epidemiological information. The expected result is Negative.  Fact Sheet for Patients:  PinkCheek.be  Fact Sheet for Healthcare Providers:  GravelBags.it  This test is no t yet approved or cleared by the [place] FDA and  has been authorized for detection and/or diagnosis of SARS-CoV-2 by FDA under an Emergency Use Authorization (EUA). This EUA will remain  in effect (meaning this test can be used) for the duration of the COVID-19  declaration under Section 564(b)(1) of the Act, 21 U.S.C. section 360bbb-3(b)(1), unless the authorization is terminated or revoked sooner.     Influenza A by PCR NEGATIVE NEGATIVE Final   Influenza B by PCR NEGATIVE NEGATIVE Final    Comment: (NOTE) The Xpert Xpress SARS-CoV-2/FLU/RSV assay is intended as an aid in  the diagnosis of influenza from Nasopharyngeal swab specimens and  should not be used as a sole basis for treatment. Nasal washings and  aspirates are unacceptable for Xpert Xpress SARS-CoV-2/FLU/RSV  testing.  Fact Sheet for Patients: PinkCheek.be  Fact Sheet for Healthcare Providers: GravelBags.it  This test is not yet approved or cleared by the [place] FDA and  has been authorized for detection and/or diagnosis of SARS-CoV-2 by  FDA under an Emergency Use Authorization (EUA). This  EUA will remain  in effect (meaning this test can be used) for the duration of the  Covid-19 declaration under Section 564(b)(1) of the Act, 21  U.S.C. section 360bbb-3(b)(1), unless the authorization is  terminated or revoked. Performed at [facility], [address] [place], [place] [address]   Culture, blood (routine x 2)     Status: None (Preliminary result)   Collection Time: 05/06/20  5:16 AM   Specimen: BLOOD  Result Value Ref Range Status   Specimen Description BLOOD  Final   Special Requests NONE  Final   Culture   Final    NO GROWTH < 12 HOURS Performed at [facility], [address] [place], [place] [address]    Report Status PENDING  Incomplete  Culture, blood (routine x 2)     Status: None (Preliminary result)   Collection Time: 05/06/20  5:21 AM   Specimen: BLOOD  Result Value Ref Range Status   Specimen Description BLOOD  Final   Special Requests NONE  Final   Culture   Final    NO GROWTH < 12 HOURS Performed at [facility], [address], [place], [place] [address]    Report Status PENDING   Incomplete    Radiology Reports CT Head Wo Contrast  Result Date: 05/05/2020 CLINICAL DATA:  Acute onset altered mental status and gait disturbances at 11 a.m. today. EXAM: CT HEAD WITHOUT CONTRAST TECHNIQUE: Contiguous axial images were obtained from the base of the skull through the vertex without intravenous contrast. COMPARISON:  CT head 03/11/2020.  MRI brain 03/11/2020. FINDINGS: Brain: Diffuse cerebral atrophy. Ventricular dilatation likely due to central atrophy. Low-attenuation changes in the deep white matter consistent with small vessel ischemia. No mass-effect or midline shift. No abnormal extra-axial fluid collections. Gray-white matter junctions are distinct. Basal cisterns are not effaced. No acute intracranial hemorrhage. Vascular: Moderate intracranial arterial calcifications are present. Skull: Calvarium appears intact. Sinuses/Orbits: Paranasal sinuses and mastoid air cells are clear. Other: No significant change since prior study. IMPRESSION: 1. No acute intracranial abnormalities. 2. Chronic atrophy and small vessel ischemia. Electronically Signed   By: Lucienne Capers M.D.   On: 05/05/2020 22:54   CT HIP LEFT WO CONTRAST  Result Date: 05/06/2020 CLINICAL DATA:  Left hip pain and bruising since a fall approximately 1 week ago. Possible soft tissue infection. EXAM: CT OF THE LEFT HIP WITHOUT CONTRAST TECHNIQUE: Multidetector CT imaging of the left hip was performed according to the standard protocol. Multiplanar CT image reconstructions were also generated. COMPARISON:  Plain films left hip 05/06/2020 and 05/05/2020. FINDINGS: Bones/Joint/Cartilage The left hip is located. No fracture is identified. A 0.9 cm in diameter sclerotic lesion in the central aspect of the sacrum has an appearance most suggestive of a bone island. Minimal osteophytosis is seen about the left hip but the joint space is preserved. There is also some chondrocalcinosis present. No joint effusion is identified.  Ligaments Suboptimally assessed by CT. Muscles and Tendons There is a hematoma in the left gluteal musculature measuring approximately 8.5 cm craniocaudal by 8 cm transverse by 5 cm AP. There is no gas within the collection. Imaged musculature is otherwise unremarkable. Soft tissues There is stranding in subcutaneous fat adjacent to the left hip. No focal fluid collection. IMPRESSION: Hematoma in the left gluteal musculature as described above. Stranding in subcutaneous fat lateral to the left hip may be posttraumatic or could be due to cellulitis. Mild left hip osteoarthritis and chondrocalcinosis. No acute bony abnormality or joint  effusion. Electronically Signed   By: Inge Rise M.D.   On: 05/06/2020 10:05   DG Chest Port 1 View  Result Date: 05/06/2020 CLINICAL DATA:  Confusion.  Gait disturbance. EXAM: PORTABLE CHEST 1 VIEW COMPARISON:  03/11/2020. FINDINGS: Postoperative changes in the mediastinum. Shallow inspiration. Mild cardiac enlargement. Peribronchial thickening with streaky perihilar opacities possibly representing early viral pneumonia. No focal consolidation. No pleural effusions. No pneumothorax. Calcification of the aorta. Degenerative changes in the shoulders. IMPRESSION: Mild cardiac enlargement. Peribronchial thickening with streaky perihilar opacities possibly representing early viral pneumonia. Electronically Signed   By: Lucienne Capers M.D.   On: 05/06/2020 01:52   DG Hip Port Unilat W or Wo Pelvis 1 View Left  Result Date: 05/06/2020 CLINICAL DATA:  79 year old male with fall and left hip pain. EXAM: DG HIP (WITH OR WITHOUT PELVIS) 1V PORT LEFT COMPARISON:  Left hip radiograph dated 04/25/2020. FINDINGS: There is no acute fracture or dislocation. The bones are osteopenic. Bilateral hip osteoarthritic changes. There is degenerative changes of the lower lumbar spine. The soft tissues are unremarkable. IMPRESSION: Negative. Electronically Signed   By: Anner Crete M.D.    On: 05/06/2020 03:50   DG HIP UNILAT W OR W/O PELVIS 2-3 VIEWS LEFT  Result Date: 04/26/2020 CLINICAL DATA:  Fall this morning with pain between trochanters. EXAM: DG HIP (WITH OR WITHOUT PELVIS) 2-3V LEFT COMPARISON:  None. FINDINGS: Mild symmetric osteoarthritic change of the hips. No acute fracture or dislocation. Mild calcified plaque over the femoral arteries. Degenerative change of the lumbar spine. IMPRESSION: 1. No acute findings. 2. Mild symmetric osteoarthritic change of the hips. Electronically Signed   By: Marin Olp M.D.   On: 04/26/2020 10:33     Time Spent in minutes  30     Desiree Hane M.D on 05/06/2020 at 11:14 AM  To page go to [email or web address] - password [facility]

## 2020-05-06 NOTE — Plan of Care (Signed)
  Problem: Acute Rehab PT Goals(only PT should resolve) Goal: Pt Will Go Supine/Side To Sit Outcome: Progressing Flowsheets (Taken 05/06/2020 1600) Pt will go Supine/Side to Sit: with minimal assist Goal: Patient Will Transfer Sit To/From Stand Outcome: Progressing Flowsheets (Taken 05/06/2020 1600) Patient will transfer sit to/from stand: with minimal assist Goal: Pt Will Transfer Bed To Chair/Chair To Bed Outcome: Progressing Flowsheets (Taken 05/06/2020 1600) Pt will Transfer Bed to Chair/Chair to Bed: with min assist Goal: Pt Will Ambulate Outcome: Progressing Flowsheets (Taken 05/06/2020 1600) Pt will Ambulate:  50 feet  with minimal assist  with rolling walker   4:00 PM, 05/06/20 Lonell Grandchild, MPT Physical Therapist with [facility] 336 [phone] office [phone] mobile phone

## 2020-05-06 NOTE — Progress Notes (Addendum)
MD notified of pts status. Pt appears to have right side deficits. Drift in right arm when raised. Right leg displays weakness. Pt is confused and fatigue. CT showed negative results for stroke activity.   Went in to reassess pt before shift change and asked pt to raise right arm and pt raised right arm with no drift present.

## 2020-05-06 NOTE — Progress Notes (Signed)
Inpatient Diabetes Program Recommendations  AACE/ADA: New Consensus Statement on Inpatient Glycemic Control (2015)  Target Ranges:  Prepandial:   less than 140 mg/dL      Peak postprandial:   less than 180 mg/dL (1-2 hours)      Critically ill patients:  140 - 180 mg/dL   Lab Results  Component Value Date   GLUCAP 93 05/06/2020   HGBA1C 6.4 (A) 04/10/2020    Review of Glycemic Control  Diabetes history: DM 2 Outpatient Diabetes medications: Trulicity 1.5 mg weekly, Tresiba 50 units Daily, Humalog 6 units qhs Current orders for Inpatient glycemic control:  Novolog 0-15 units tid  Inpatient Diabetes Program Recommendations:    Note: insulin not given since here at hospital. Glucose 63 this am.  Home Tresiba insulin has a long half life of 42 hours.  Due to renal function consider:  - reducing Novolog Correction to "sensitive" 0-9 units tid + hs scale.  Thanks,  Tama Headings RN, MSN, BC-ADM Inpatient Diabetes Coordinator Team Pager [phone] (8a-5p)

## 2020-05-06 NOTE — ED Notes (Signed)
Hospitalist at bedside 

## 2020-05-06 NOTE — ED Notes (Addendum)
Pt cbg 63, implemented hypoglycemia protocol per standing orders. Dextrose administered. Pt family at bedside asking to talk with hospitalist. Pt more alert and interacting with family conversationally. Hospitalist aware.

## 2020-05-06 NOTE — Progress Notes (Signed)
In to complete admission assessment screenings. Patient hard to arouse, but with physical stimuli, patient will open eyes and acknowledge RN briefly. Able to state month and date of birthday with repeat prompting, but poor concentration and lethargy noted. Unable to states year of birth. Patient not able to answer any other questions. Wife in to visit patient and assessment screenings completed with her.

## 2020-05-06 NOTE — ED Notes (Addendum)
Pt refused breakfast tray and am medications. Increased confusion/irritability also noted. Hospitalist aware.

## 2020-05-07 DIAGNOSIS — G9341 Metabolic encephalopathy: Secondary | ICD-10-CM

## 2020-05-07 DIAGNOSIS — T148XXA Other injury of unspecified body region, initial encounter: Secondary | ICD-10-CM | POA: Diagnosis present

## 2020-05-07 DIAGNOSIS — W19XXXA Unspecified fall, initial encounter: Secondary | ICD-10-CM | POA: Diagnosis present

## 2020-05-07 DIAGNOSIS — D500 Iron deficiency anemia secondary to blood loss (chronic): Secondary | ICD-10-CM | POA: Diagnosis present

## 2020-05-07 LAB — CBC
HCT: 28.3 % — ABNORMAL LOW (ref 39.0–52.0)
Hemoglobin: 9.2 g/dL — ABNORMAL LOW (ref 13.0–17.0)
MCH: 28 pg (ref 26.0–34.0)
MCHC: 32.5 g/dL (ref 30.0–36.0)
MCV: 86.3 fL (ref 80.0–100.0)
Platelets: 173 10*3/uL (ref 150–400)
RBC: 3.28 MIL/uL — ABNORMAL LOW (ref 4.22–5.81)
RDW: 14 % (ref 11.5–15.5)
WBC: 8.8 10*3/uL (ref 4.0–10.5)
nRBC: 0 % (ref 0.0–0.2)

## 2020-05-07 LAB — COMPREHENSIVE METABOLIC PANEL
ALT: 16 U/L (ref 0–44)
AST: 19 U/L (ref 15–41)
Albumin: 2.8 g/dL — ABNORMAL LOW (ref 3.5–5.0)
Alkaline Phosphatase: 53 U/L (ref 38–126)
Anion gap: 11 (ref 5–15)
BUN: 55 mg/dL — ABNORMAL HIGH (ref 8–23)
CO2: 23 mmol/L (ref 22–32)
Calcium: 8.3 mg/dL — ABNORMAL LOW (ref 8.9–10.3)
Chloride: 107 mmol/L (ref 98–111)
Creatinine, Ser: 2.04 mg/dL — ABNORMAL HIGH (ref 0.61–1.24)
GFR, Estimated: 31 mL/min — ABNORMAL LOW (ref >60)
Glucose, Bld: 96 mg/dL (ref 70–99)
Potassium: 3.9 mmol/L (ref 3.5–5.1)
Sodium: 141 mmol/L (ref 135–145)
Total Bilirubin: 2.4 mg/dL — ABNORMAL HIGH (ref 0.3–1.2)
Total Protein: 5.5 g/dL — ABNORMAL LOW (ref 6.5–8.1)

## 2020-05-07 LAB — GLUCOSE, CAPILLARY
Glucose-Capillary: 81 mg/dL (ref 70–99)
Glucose-Capillary: 153 mg/dL — ABNORMAL HIGH (ref 70–99)
Glucose-Capillary: 182 mg/dL — ABNORMAL HIGH (ref 70–99)
Glucose-Capillary: 143 mg/dL — ABNORMAL HIGH (ref 70–99)

## 2020-05-07 LAB — PROCALCITONIN: Procalcitonin: <0.1 ng/mL

## 2020-05-07 MED ORDER — PIPERACILLIN-TAZOBACTAM 3.375 G IVPB
3.3750 g | Freq: Three times a day (TID) | INTRAVENOUS | Status: DC
  Administered 2020-05-07 – 2020-05-08 (×3): 3.375 g via INTRAVENOUS
  Filled 2020-05-07 (×3): qty 50

## 2020-05-07 NOTE — TOC Progression Note (Signed)
Transition of Care [facility]) - Progression Note    Patient Details  Name: Evan Stanley MRN: [id number] Date of Birth: 1941-01-18  Transition of Care [facility]) CM/SW Contact  Salome Arnt, [place] Phone Number: 05/07/2020, 9:35 AM  Clinical Narrative:  Provided bed offers to pt's wife who accepts [facility]. Facility notified. LCSW updated Navi and faxed clinicals.     Expected Discharge Plan: [place] Barriers to Discharge: Continued Medical Work up  Expected Discharge Plan and Services Expected Discharge Plan: [place] In-house Referral: Clinical Social Work   Post Acute Care Choice: [place] Living arrangements for the past 2 months: Single Family Home                 DME Arranged: N/A         HH Arranged: NA           Social Determinants of Health (SDOH) Interventions    Readmission Risk Interventions No flowsheet data found.

## 2020-05-07 NOTE — [place] FL2 (Signed)
[place] MEDICAID FL2 LEVEL OF CARE SCREENING TOOL     IDENTIFICATION  Patient Name: Evan Stanley Birthdate: 09/28/1940 Sex: male Admission Date (Current Location): 05/05/2020  [facility] and [place] Number:  [employer] and Address:  [place] [address] [place]      Provider Number: [id number]  Attending Physician Name and Address:  Desiree Hane, MD  Relative Name and Phone Number:       Current Level of Care: Hospital Recommended Level of Care: [place] Prior Approval Number:    Date Approved/Denied:   PASRR Number: [id number]  Discharge Plan: SNF    Current Diagnoses: Patient Active Problem List   Diagnosis Date Noted   Metabolic encephalopathy 51/70/0174   Paroxysmal A-fib ([place]) 01/04/2020   Gait disorder    Stroke ([place]) 12/24/2019   Posterior vitreous detachment of right eye 11/29/2019   Branch retinal artery occlusion of left eye 11/29/2019   Rubeosis iridis of left eye 11/29/2019   Glaucoma associated with ocular disorder, left, severe stage 11/29/2019   History of TIA (transient ischemic attack) 05/13/2019   Coronary arteriosclerosis in native artery 12/12/2018   Type 2 diabetes mellitus ([place]) 12/12/2018   Right Carotid artery stenosis 11/03/2017   Crohn's disease ([place]) 04/30/2016   Stage 3 chronic kidney disease ([place]) 04/29/2016   Late effects of cerebrovascular disease 04/03/2016   History of aortic valve replacement 01/05/2016   Left Carotid artery occlusion 11/18/2015   Disorder of carotid artery ([place]) 11/18/2015   Occlusion and stenosis of right carotid artery 10/26/2014   Hyperlipidemia associated w/ type 2 DM goal <70 07/05/2013   Hypertension associated with diabetes ([place]) 07/05/2013   Hyperlipidemia, unspecified 07/05/2013    Orientation RESPIRATION BLADDER Height & Weight     Self  Normal External catheter Weight: 165 lb 5.5 oz (75 kg) Height:  5' 5"   (165.1 cm)  BEHAVIORAL SYMPTOMS/MOOD NEUROLOGICAL BOWEL NUTRITION STATUS      Incontinent Diet (heart healthy/carb modified. See d/c summary for updates.)  AMBULATORY STATUS COMMUNICATION OF NEEDS Skin   Extensive Assist Verbally  (hematoma to left hip)                       Personal Care Assistance Level of Assistance  Bathing, Feeding, Dressing Bathing Assistance: Maximum assistance Feeding assistance: Limited assistance Dressing Assistance: Maximum assistance     Functional Limitations Info  Sight, Hearing, Speech Sight Info: Adequate Hearing Info: Adequate Speech Info: Adequate    SPECIAL CARE FACTORS FREQUENCY  PT (By licensed PT)     PT Frequency: 5x weekly              Contractures      Additional Factors Info  Allergies, Code Status Code Status Info: Full code Allergies Info: Baclofen, Morphine and Related           Current Medications (05/07/2020):  This is the current hospital active medication list Current Facility-Administered Medications  Medication Dose Route Frequency Provider Last Rate Last Admin   0.9 %  sodium chloride infusion   Intravenous Continuous Zierle-Ghosh, Asia B, DO 75 mL/hr at 05/06/20 1800 Rate Verify at 05/06/20 1800   acetaminophen (TYLENOL) tablet 650 mg  650 mg Oral Q6H PRN Zierle-Ghosh, Asia B, DO       Or   acetaminophen (TYLENOL) suppository 650 mg  650 mg Rectal Q6H PRN Zierle-Ghosh, Asia B, DO       atorvastatin (LIPITOR) tablet  40 mg  40 mg Oral Daily Zierle-Ghosh, Asia B, DO       cyanocobalamin ((VITAMIN B-12)) injection 1,000 mcg  1,000 mcg Intramuscular Daily Nettey, Shayla D, MD       insulin aspart (novoLOG) injection 0-9 Units  0-9 Units Subcutaneous TID WC Nettey, Shayla D, MD       latanoprost (XALATAN) 0.005 % ophthalmic solution 1 drop  1 drop Left Eye QHS Oretha Milch D, MD       ondansetron (ZOFRAN) tablet 4 mg  4 mg Oral Q6H PRN Zierle-Ghosh, Asia B, DO       Or   ondansetron (ZOFRAN)  injection 4 mg  4 mg Intravenous Q6H PRN Zierle-Ghosh, Asia B, DO       piperacillin-tazobactam (ZOSYN) IVPB 3.375 g  3.375 g Intravenous Q12H Oretha Milch D, MD 12.5 mL/hr at 05/07/20 0513 3.375 g at 05/07/20 2162     Discharge Medications: Please see discharge summary for a list of discharge medications.  Relevant Imaging Results:  Relevant Lab Results:   Additional Information SSN: [id number]. Moderna vaccines 08/16/19 and 09/13/19.  Salome Arnt, LCSW

## 2020-05-07 NOTE — Plan of Care (Signed)
  Problem: Education: Goal: Knowledge of General Education information will improve Description Including pain rating scale, medication(s)/side effects and non-pharmacologic comfort measures Outcome: Progressing   Problem: Health Behavior/Discharge Planning: Goal: Ability to manage health-related needs will improve Outcome: Progressing   

## 2020-05-07 NOTE — Progress Notes (Addendum)
[facility]  PROGRESS NOTE  FILIPPO PULS UXN:[id number] DOB: 1940/07/31 DOA: 05/05/2020 PCP: [name], DO Admit date - 05/05/2020   Admitting Physician Rolla Plate, DO  Outpatient Primary MD for the patient is [name], DO  LOS - 1 Brief Narrative   Mr. Mcnease is a 79 year old male with medical history significant to but not limited by CVA, HTN, type 2 diabetes, A. fib, CHF presented to the ED on 10/18 with family reporting worsening confusion and difficulty walking after a fall about a week ago.  Patient was admitted under the working diagnosis of acute metabolic encephalopathy in the setting of opioid use for left hip pain complicated by AKI, hypotension.    Subjective  Sitting at bedside chair with wife present.  States his left hip pain is much improved.  Willing to try to eat today  A & P     Acute encephalopathy, suspect metabolic from opiate use, as well as B12 deficiency, and likely also related to pain from left hip/gluteal musculature hematoma, improving.  Other work-up showed TSH within normal limits, Tylenol, salicylate levels, UA, UDS unremarkable (other than positive for opioids).  CT head nonacute.  Patient has no focal deficits and is able to follow commands.  Today seems more improved with patient recognizes his wife and answering questions more appropriately b  Doubt infection -Replete vitamin B12 -Avoid sedative medications -Delirium precautions -Currently on IV Zosyn for possible infection, will check pro-Cal if negative will discontinue  Symptomatic, Vit B12 deficiency. Folate wnl.  Likely explains poor gait disturbances causing falls.  Has new anemia but likely related to hematoma addressed more below. Unclear why he is deficient--wife is vegetarian but states he eats meat, no history of bariatric or abdominal surgery, no chronic history of malnutrition, no Etoh abuse.  In chart review prior history of ileocolitis presumed to be  infection given Salmonella but some concern for possible Crohn's disease for which patient was recommended to receive outpatient GI follow-up (04/30/2016) -Replete IV B12 1081mg x 7 days  Left gluteal muscle hematoma.  Patient presented with recurrent falls and patient is on Eliquis.  Had notable swelling in left hip gluteus muscle, initial x-ray unremarkable.  CT hip consistent with posttraumatic hematoma.  Hemoglobin remained stable while holding home Eliquis during hospital stay -Currently no evidence of compartment syndrome though limited by pain, continue serial exams -Continue to monitor CBC -If hemoglobin continues to drop may need to discontinue or hold Eliquis, otherwise would advise holding for least 1 more day before restarting  Acute anemia in the setting of posttraumatic gluteal muscle hematoma.  Occurred in the setting of Eliquis use.    Hemoglobin  around 10 on admission, hemoglobin currently 9.2.  Previous baseline 16. -Daily CBC -Monitor blood pressure -Hold home Eliquis  AKI on CKD stage IIIb.  Baseline creatinine 1.4-1.9, presented with creatinine of 3.39, already down trended back to creatinine of 2.  Likely prerenal given some relative hypotension on admission, status post LR bolus,  -Currently on 75 cc IV fluids times additional 24 hours -Holding home lisinopril -Monitor output, repeat BMP in a.m.  Atrial fibrillation, currently rate controlled. Has history of CVA but risk of continuing eliquis and worsening hematoma/anemia outweighs benefit currently -Hold home Toprol given somewhat soft blood pressures -Monitor on telemetry -Discontinued Eliquis in the setting of hematoma on admission, discussed risk vs benefits with wife who is agreeable with plan, anticipate continuing to hold Eliquis for least 1 more day before resuming  it with close monitoring of hemoglobin  HTN.  Actually running a bit soft blood pressures with SBP 110-120s. -Hold home HCTZ,  lisinopril  Type 2 diabetes, well controlled A1c 6.4.  Fasting blood sugars in 80s to 90s s.  Likely because patient had decreased oral intake related to confusion -Hold home Trulicity, Tresiba 15 units -Monitor CBG, sliding scale as needed until mental status improves and p.o. intake increases  Recurrent falls at home that preceded current clinical status.  Could be related to vitamin B12 deficiency which could have resulted in changes in his gait -PT consulted recommend SNF which wife is agreeable to     Family Communication  : updated wife at bedside on 10/19  Code Status : Full code as discussed on day of admission  Disposition Plan  :  Patient is from home. Anticipated d/c date:2 to 3 days. Barriers to d/c or necessity for inpatient status: Patient required IV fluids for AKI, close monitoring of hemoglobin given hematoma while on Eliquis, improvement in mental status needed to persistent confusion, IV B12 repletion given gait disturbances and recurrent falls, close monitoring of blood pressure given hypotension Consults  : None  Procedures  : None  DVT Prophylaxis  : Holding Eliquis  MDM: The below labs and imaging reports were reviewed and summarized above.  Medication management as above.  Lab Results  Component Value Date   PLT 173 05/07/2020    Diet :  Diet Order            Diet heart healthy/carb modified Room service appropriate? Yes; Fluid consistency: Thin  Diet effective now                  Inpatient Medications Scheduled Meds: . atorvastatin  40 mg Oral Daily  . cyanocobalamin  1,000 mcg Intramuscular Daily  . insulin aspart  0-9 Units Subcutaneous TID WC  . latanoprost  1 drop Left Eye QHS   Continuous Infusions: . sodium chloride 75 mL/hr at 05/06/20 1800  . piperacillin-tazobactam (ZOSYN)  IV 3.375 g (05/07/20 1308)   PRN Meds:.acetaminophen **OR** acetaminophen, ondansetron **OR** ondansetron (ZOFRAN) IV  Antibiotics  :    Anti-infectives (From admission, onward)   Start     Dose/Rate Route Frequency Ordered Stop   05/07/20 1400  piperacillin-tazobactam (ZOSYN) IVPB 3.375 g        3.375 g 12.5 mL/hr over 240 Minutes Intravenous Every 8 hours 05/07/20 1152     05/06/20 1700  piperacillin-tazobactam (ZOSYN) IVPB 3.375 g  Status:  Discontinued        3.375 g 12.5 mL/hr over 240 Minutes Intravenous Every 12 hours 05/06/20 0922 05/07/20 1152   05/06/20 0600  piperacillin-tazobactam (ZOSYN) IVPB 2.25 g  Status:  Discontinued        2.25 g 100 mL/hr over 30 Minutes Intravenous Every 8 hours 05/06/20 0515 05/06/20 0922       Objective   Vitals:   05/06/20 2100 05/06/20 2247 05/07/20 0100 05/07/20 0500  BP: (!) 102/44 (!) 113/50 (!) 110/49 (!) 118/54  Pulse: 96 86 89 81  Resp: 18 20 18 18   Temp:   98.7 F (37.1 C) 98.3 F (36.8 C)  TempSrc:   Oral Oral  SpO2: 99%  100% 100%  Weight:      Height:        SpO2: 100 %  Wt Readings from Last 3 Encounters:  05/05/20 75 kg  04/25/20 74.8 kg  04/10/20 75.8 kg     Intake/Output  Summary (Last 24 hours) at 05/07/2020 1426 Last data filed at 05/07/2020 0443 Gross per 24 hour  Intake 2333 ml  Output 201 ml  Net 2132 ml    Physical Exam:     Awake Alert, Oriented X oriented self, not place or time, recognizes wife, follows commands, no focal deficits  Normal respiratory effort on room air, CTAB RRR,No Gallops,Rubs or new Murmurs, no peripheral edema +ve B.Sounds, Abd Soft, No tenderness, No rebound, guarding or rigidity. Left hip with significant bruising, and noted swelling, but soft and nontender with no overlying erythema, left leg strength intact   I have personally reviewed the following:   Data Reviewed:  CBC Recent Labs  Lab 05/06/20 0021 05/06/20 0826 05/06/20 1353 05/07/20 0929  WBC 11.9* 10.3 11.2* 8.8  HGB 10.2* 9.7* 9.8* 9.2*  HCT 30.9* 29.2* 29.5* 28.3*  PLT 169 170 164 173  MCV 85.8 84.1 84.8 86.3  MCH 28.3 28.0  28.2 28.0  MCHC 33.0 33.2 33.2 32.5  RDW 13.9 13.7 14.0 14.0  LYMPHSABS  --  1.5  --   --   MONOABS  --  1.2*  --   --   EOSABS  --  0.2  --   --   BASOSABS  --  0.1  --   --     Chemistries  Recent Labs  Lab 05/06/20 0021 05/06/20 0826 05/07/20 0929  NA 136 139 141  K 3.8 3.8 3.9  CL 102 103 107  CO2 22 25 23   GLUCOSE 102* 86 96  BUN 66* 62* 55*  CREATININE 3.39* 2.80* 2.04*  CALCIUM 8.5* 8.3* 8.3*  MG  --  2.0  --   AST 20 21 19   ALT 16 17 16   ALKPHOS 57 53 53  BILITOT 1.5* 1.9* 2.4*   ------------------------------------------------------------------------------------------------------------------ No results for input(s): CHOL, HDL, LDLCALC, TRIG, CHOLHDL, LDLDIRECT in the last 72 hours.  Lab Results  Component Value Date   HGBA1C 6.4 (A) 04/10/2020   ------------------------------------------------------------------------------------------------------------------ Recent Labs    05/06/20 0826  TSH 1.036   ------------------------------------------------------------------------------------------------------------------ Recent Labs    05/06/20 0826  VITAMINB12 87*  FOLATE 11.3    Coagulation profile No results for input(s): INR, PROTIME in the last 168 hours.  No results for input(s): DDIMER in the last 72 hours.  Cardiac Enzymes No results for input(s): CKMB, TROPONINI, MYOGLOBIN in the last 168 hours.  Invalid input(s): CK ------------------------------------------------------------------------------------------------------------------ No results found for: BNP  Micro Results Recent Results (from the past 240 hour(s))  Respiratory Panel by RT PCR (Flu A&B, Covid) - Nasopharyngeal Swab     Status: None   Collection Time: 05/06/20  2:27 AM   Specimen: Nasopharyngeal Swab  Result Value Ref Range Status   SARS Coronavirus 2 by RT PCR NEGATIVE NEGATIVE Final    Comment: (NOTE) SARS-CoV-2 target nucleic acids are NOT DETECTED.  The SARS-CoV-2 RNA  is generally detectable in upper respiratoy specimens during the acute phase of infection. The lowest concentration of SARS-CoV-2 viral copies this assay can detect is 131 copies/mL. A negative result does not preclude SARS-Cov-2 infection and should not be used as the sole basis for treatment or other patient management decisions. A negative result may occur with  improper specimen collection/handling, submission of specimen other than nasopharyngeal swab, presence of viral mutation(s) within the areas targeted by this assay, and inadequate number of viral copies (<131 copies/mL). A negative result must be combined with clinical observations, patient history, and epidemiological information. The expected result  is Negative.  Fact Sheet for Patients:  PinkCheek.be  Fact Sheet for Healthcare Providers:  GravelBags.it  This test is no t yet approved or cleared by the [place] FDA and  has been authorized for detection and/or diagnosis of SARS-CoV-2 by FDA under an Emergency Use Authorization (EUA). This EUA will remain  in effect (meaning this test can be used) for the duration of the COVID-19 declaration under Section 564(b)(1) of the Act, 21 U.S.C. section 360bbb-3(b)(1), unless the authorization is terminated or revoked sooner.     Influenza A by PCR NEGATIVE NEGATIVE Final   Influenza B by PCR NEGATIVE NEGATIVE Final    Comment: (NOTE) The Xpert Xpress SARS-CoV-2/FLU/RSV assay is intended as an aid in  the diagnosis of influenza from Nasopharyngeal swab specimens and  should not be used as a sole basis for treatment. Nasal washings and  aspirates are unacceptable for Xpert Xpress SARS-CoV-2/FLU/RSV  testing.  Fact Sheet for Patients: PinkCheek.be  Fact Sheet for Healthcare Providers: GravelBags.it  This test is not yet approved or cleared by the [place] FDA and  has been authorized for detection and/or diagnosis of SARS-CoV-2 by  FDA under an Emergency Use Authorization (EUA). This EUA will remain  in effect (meaning this test can be used) for the duration of the  Covid-19 declaration under Section 564(b)(1) of the Act, 21  U.S.C. section 360bbb-3(b)(1), unless the authorization is  terminated or revoked. Performed at [facility], [address], [place], [place] [address]   Culture, blood (routine x 2)     Status: None (Preliminary result)   Collection Time: 05/06/20  5:16 AM   Specimen: BLOOD  Result Value Ref Range Status   Specimen Description BLOOD  Final   Special Requests NONE  Final   Culture   Final    NO GROWTH 1 DAY Performed at [facility], [address], [place], [place] [address]    Report Status PENDING  Incomplete  Culture, blood (routine x 2)     Status: None (Preliminary result)   Collection Time: 05/06/20  5:21 AM   Specimen: BLOOD  Result Value Ref Range Status   Specimen Description BLOOD  Final   Special Requests NONE  Final   Culture   Final    NO GROWTH 1 DAY Performed at [facility], [address] [place],  [address]    Report Status PENDING  Incomplete    Radiology Reports CT Head Wo Contrast  Result Date: 05/05/2020 CLINICAL DATA:  Acute onset altered mental status and gait disturbances at 11 a.m. today. EXAM: CT HEAD WITHOUT CONTRAST TECHNIQUE: Contiguous axial images were obtained from the base of the skull through the vertex without intravenous contrast. COMPARISON:  CT head 03/11/2020.  MRI brain 03/11/2020. FINDINGS: Brain: Diffuse cerebral atrophy. Ventricular dilatation likely due to central atrophy. Low-attenuation changes in the deep white matter consistent with small vessel ischemia. No mass-effect or midline shift. No abnormal extra-axial fluid collections. Gray-white matter junctions are distinct. Basal cisterns are not effaced. No acute intracranial hemorrhage. Vascular:  Moderate intracranial arterial calcifications are present. Skull: Calvarium appears intact. Sinuses/Orbits: Paranasal sinuses and mastoid air cells are clear. Other: No significant change since prior study. IMPRESSION: 1. No acute intracranial abnormalities. 2. Chronic atrophy and small vessel ischemia. Electronically Signed   By: Lucienne Capers M.D.   On: 05/05/2020 22:54   CT HIP LEFT WO CONTRAST  Result Date: 05/06/2020 CLINICAL DATA:  Left hip pain and bruising since a fall  approximately 1 week ago. Possible soft tissue infection. EXAM: CT OF THE LEFT HIP WITHOUT CONTRAST TECHNIQUE: Multidetector CT imaging of the left hip was performed according to the standard protocol. Multiplanar CT image reconstructions were also generated. COMPARISON:  Plain films left hip 05/06/2020 and 05/05/2020. FINDINGS: Bones/Joint/Cartilage The left hip is located. No fracture is identified. A 0.9 cm in diameter sclerotic lesion in the central aspect of the sacrum has an appearance most suggestive of a bone island. Minimal osteophytosis is seen about the left hip but the joint space is preserved. There is also some chondrocalcinosis present. No joint effusion is identified. Ligaments Suboptimally assessed by CT. Muscles and Tendons There is a hematoma in the left gluteal musculature measuring approximately 8.5 cm craniocaudal by 8 cm transverse by 5 cm AP. There is no gas within the collection. Imaged musculature is otherwise unremarkable. Soft tissues There is stranding in subcutaneous fat adjacent to the left hip. No focal fluid collection. IMPRESSION: Hematoma in the left gluteal musculature as described above. Stranding in subcutaneous fat lateral to the left hip may be posttraumatic or could be due to cellulitis. Mild left hip osteoarthritis and chondrocalcinosis. No acute bony abnormality or joint effusion. Electronically Signed   By: Inge Rise M.D.   On: 05/06/2020 10:05   DG Chest Port 1 View  Result Date:  05/06/2020 CLINICAL DATA:  Confusion.  Gait disturbance. EXAM: PORTABLE CHEST 1 VIEW COMPARISON:  03/11/2020. FINDINGS: Postoperative changes in the mediastinum. Shallow inspiration. Mild cardiac enlargement. Peribronchial thickening with streaky perihilar opacities possibly representing early viral pneumonia. No focal consolidation. No pleural effusions. No pneumothorax. Calcification of the aorta. Degenerative changes in the shoulders. IMPRESSION: Mild cardiac enlargement. Peribronchial thickening with streaky perihilar opacities possibly representing early viral pneumonia. Electronically Signed   By: Lucienne Capers M.D.   On: 05/06/2020 01:52   DG Hip Port Unilat W or Wo Pelvis 1 View Left  Result Date: 05/06/2020 CLINICAL DATA:  79 year old male with fall and left hip pain. EXAM: DG HIP (WITH OR WITHOUT PELVIS) 1V PORT LEFT COMPARISON:  Left hip radiograph dated 04/25/2020. FINDINGS: There is no acute fracture or dislocation. The bones are osteopenic. Bilateral hip osteoarthritic changes. There is degenerative changes of the lower lumbar spine. The soft tissues are unremarkable. IMPRESSION: Negative. Electronically Signed   By: Anner Crete M.D.   On: 05/06/2020 03:50   DG HIP UNILAT W OR W/O PELVIS 2-3 VIEWS LEFT  Result Date: 04/26/2020 CLINICAL DATA:  Fall this morning with pain between trochanters. EXAM: DG HIP (WITH OR WITHOUT PELVIS) 2-3V LEFT COMPARISON:  None. FINDINGS: Mild symmetric osteoarthritic change of the hips. No acute fracture or dislocation. Mild calcified plaque over the femoral arteries. Degenerative change of the lumbar spine. IMPRESSION: 1. No acute findings. 2. Mild symmetric osteoarthritic change of the hips. Electronically Signed   By: Marin Olp M.D.   On: 04/26/2020 10:33     Time Spent in minutes  30     Desiree Hane M.D on 05/07/2020 at 2:26 PM  To page go to [email or web address] - password [facility]

## 2020-05-07 NOTE — TOC Initial Note (Signed)
Transition of Care [facility]) - Initial/Assessment Note    Patient Details  Name: Evan Stanley MRN: [id number] Date of Birth: 29-Apr-1941  [facility]) CM/SW Contact:    Salome Arnt, [place] Phone Number: 05/07/2020, 8:52 AM  Clinical Narrative:  Pt admitted due to metabolic encephalopathy. LCSW completed assessment with pt's wife over phone. She states pt fell about a week ago outside. He went to PCP and was able to get around some using walker. Pt is independent with ADLs at baseline. Wife states that over the weekend pt got much worse and their 3 sons had to come multiples times to help get pt up. She also said pt has had some periods of confusion the past year that come and go. Discussed PT recommendation for SNF. Pt's wife agrees she cannot take pt home right now as he is requiring too much assistance. Agreeable to referral to SNFs in [name] and [place]. Will initiate bed search. LCSW started [facility]- ref #: [id number]. TOC will continue to follow.                   Expected Discharge Plan: Skilled Nursing Facility Barriers to Discharge: Continued Medical Work up   Patient Goals and CMS Choice Patient states their goals for this hospitalization and ongoing recovery are:: short term SNF   Choice offered to / list presented to : Spouse  Expected Discharge Plan and Services Expected Discharge Plan: [place] In-house Referral: Clinical Social Work   Post Acute Care Choice: [place] Living arrangements for the past 2 months: Single Family Home                 DME Arranged: N/A         HH Arranged: NA          Prior Living Arrangements/Services Living arrangements for the past 2 months: Single Family Home Lives with:: Spouse Patient language and need for interpreter reviewed:: Yes Do you feel safe going back to the place where you live?: No   needs rehab  Need for Family Participation in Patient Care: Yes (Comment) Care  giver support system in place?: Yes (comment) Current home services: DME (walker, shower chair) Criminal Activity/Legal Involvement Pertinent to Current Situation/Hospitalization: No - Comment as needed  Activities of Daily Living Home Assistive Devices/Equipment: Walker (specify type) ADL Screening (condition at time of admission) Patient's cognitive ability adequate to safely complete daily activities?: No Is the patient deaf or have difficulty hearing?: No Does the patient have difficulty seeing, even when wearing glasses/contacts?: No Does the patient have difficulty concentrating, remembering, or making decisions?: Yes Patient able to express need for assistance with ADLs?: No Does the patient have difficulty dressing or bathing?: Yes Independently performs ADLs?: No Communication: Independent Dressing (OT): Needs assistance Grooming: Needs assistance Feeding: Needs assistance Bathing: Needs assistance Toileting: Needs assistance In/Out Bed: Needs assistance Walks in Home: Independent with device (comment) Does the patient have difficulty walking or climbing stairs?: Yes Weakness of Legs: Both Weakness of Arms/Hands: Both  Permission Sought/Granted                  Emotional Assessment   Attitude/Demeanor/Rapport: Unable to Assess Affect (typically observed): Unable to Assess Orientation: : Oriented to Self Alcohol / Substance Use: Not Applicable Psych Involvement: No (comment)  Admission diagnosis:  Metabolic encephalopathy [Z56.38] AKI (acute kidney injury) ([place]) [N17.9] Contusion of left hip, initial encounter [S70.02XA] Patient Active Problem List   Diagnosis Date  Noted  . Metabolic encephalopathy 39/53/2023  . Paroxysmal A-fib ([place]) 01/04/2020  . Gait disorder   . Stroke ([place]) 12/24/2019  . Posterior vitreous detachment of right eye 11/29/2019  . Branch retinal artery occlusion of left eye 11/29/2019  . Rubeosis iridis of left eye 11/29/2019  . Glaucoma  associated with ocular disorder, left, severe stage 11/29/2019  . History of TIA (transient ischemic attack) 05/13/2019  . Coronary arteriosclerosis in native artery 12/12/2018  . Type 2 diabetes mellitus ([place]) 12/12/2018  . Right Carotid artery stenosis 11/03/2017  . Crohn's disease ([place]) 04/30/2016  . Stage 3 chronic kidney disease ([place]) 04/29/2016  . Late effects of cerebrovascular disease 04/03/2016  . History of aortic valve replacement 01/05/2016  . Left Carotid artery occlusion 11/18/2015  . Disorder of carotid artery ([place]) 11/18/2015  . Occlusion and stenosis of right carotid artery 10/26/2014  . Hyperlipidemia associated w/ type 2 DM goal <70 07/05/2013  . Hypertension associated with diabetes ([place]) 07/05/2013  . Hyperlipidemia, unspecified 07/05/2013   PCP:  [name], DO Pharmacy:   [facility], [address], [address] [place], [place] [address] Phone: [phone] Fax: [phone]  [id number] [address]: [phone]: [phone] [facility] [place] [address]Phone: [phone]: [phone] [facility] [place]Phone: [phone]: [phone]    Social Determinants of Health (SDOH) Interventions    Readmission Risk Interventions No flowsheet data found.

## 2020-05-08 ENCOUNTER — Encounter: Payer: Medicare Other | Admitting: Physical Therapy

## 2020-05-08 DIAGNOSIS — W19XXXA Unspecified fall, initial encounter: Secondary | ICD-10-CM

## 2020-05-08 DIAGNOSIS — G9341 Metabolic encephalopathy: Secondary | ICD-10-CM | POA: Diagnosis not present

## 2020-05-08 LAB — CBC WITH DIFFERENTIAL/PLATELET
Abs Immature Granulocytes: 0.06 10*3/uL (ref 0.00–0.07)
Basophils Absolute: 0.1 10*3/uL (ref 0.0–0.1)
Basophils Relative: 1 %
Eosinophils Absolute: 0.2 10*3/uL (ref 0.0–0.5)
Eosinophils Relative: 2 %
HCT: 28.7 % — ABNORMAL LOW (ref 39.0–52.0)
Hemoglobin: 9.3 g/dL — ABNORMAL LOW (ref 13.0–17.0)
Immature Granulocytes: 1 %
Lymphocytes Relative: 15 %
Lymphs Abs: 1.3 10*3/uL (ref 0.7–4.0)
MCH: 28.4 pg (ref 26.0–34.0)
MCHC: 32.4 g/dL (ref 30.0–36.0)
MCV: 87.5 fL (ref 80.0–100.0)
Monocytes Absolute: 1.1 10*3/uL — ABNORMAL HIGH (ref 0.1–1.0)
Monocytes Relative: 12 %
Neutro Abs: 6 10*3/uL (ref 1.7–7.7)
Neutrophils Relative %: 69 %
Platelets: 176 10*3/uL (ref 150–400)
RBC: 3.28 MIL/uL — ABNORMAL LOW (ref 4.22–5.81)
RDW: 14 % (ref 11.5–15.5)
WBC: 8.7 10*3/uL (ref 4.0–10.5)
nRBC: 0 % (ref 0.0–0.2)

## 2020-05-08 LAB — COMPREHENSIVE METABOLIC PANEL
ALT: 24 U/L (ref 0–44)
AST: 25 U/L (ref 15–41)
Albumin: 3 g/dL — ABNORMAL LOW (ref 3.5–5.0)
Alkaline Phosphatase: 58 U/L (ref 38–126)
Anion gap: 11 (ref 5–15)
BUN: 44 mg/dL — ABNORMAL HIGH (ref 8–23)
CO2: 24 mmol/L (ref 22–32)
Calcium: 8.6 mg/dL — ABNORMAL LOW (ref 8.9–10.3)
Chloride: 106 mmol/L (ref 98–111)
Creatinine, Ser: 1.74 mg/dL — ABNORMAL HIGH (ref 0.61–1.24)
GFR, Estimated: 37 mL/min — ABNORMAL LOW (ref >60)
Glucose, Bld: 145 mg/dL — ABNORMAL HIGH (ref 70–99)
Potassium: 3.7 mmol/L (ref 3.5–5.1)
Sodium: 141 mmol/L (ref 135–145)
Total Bilirubin: 2.7 mg/dL — ABNORMAL HIGH (ref 0.3–1.2)
Total Protein: 5.8 g/dL — ABNORMAL LOW (ref 6.5–8.1)

## 2020-05-08 LAB — GLUCOSE, CAPILLARY
Glucose-Capillary: 127 mg/dL — ABNORMAL HIGH (ref 70–99)
Glucose-Capillary: 184 mg/dL — ABNORMAL HIGH (ref 70–99)
Glucose-Capillary: 152 mg/dL — ABNORMAL HIGH (ref 70–99)

## 2020-05-08 LAB — PROCALCITONIN: Procalcitonin: <0.1 ng/mL

## 2020-05-08 NOTE — Progress Notes (Signed)
PROGRESS NOTE    Evan Stanley  GYB:[id number] DOB: [birth year] DOA: 05/05/2020 PCP: [name], DO  Brief Narrative:  79 year old white male known prior stroke with left visual loss 8768 acute embolic stroke 07/20/5724/OMBTD cerebellar CVA 2017 with frontoparietal infarct after angiogram and is status post right-sided CEA, bioprosthetic aortic valve 2010, prior smoker quit in 1988 CAD, chronic systolic heart failure with improvement to EF 65-70% 12/29/2019 and grade 1 diastolic dysfunction, DM TY 2, CKD stage III, HTN Patient has had prior folliculitis to the right gluteal area x2 [facility] with altered mental status unable to provide history-family shared patient had increasing encephalopathy and difficulty walking a week prior to admission Also found to have left hip pain complicated by AKI, left gluteal muscle hematoma  Assessment & Plan:   Active Problems:   Type 2 diabetes mellitus (HCC)   AKI (acute kidney injury) ([place])   Stage 3 chronic kidney disease ([place])   History of TIA (transient ischemic attack)   Metabolic encephalopathy   Hematoma of muscle   Normocytic anemia due to blood loss   Falls   1. Hypotension secondary to possible blood loss anemia 2. ?  Leukocytosis with question of infection on admission a. Zosyn was given 10/17 through 10/19 and discontinued subsequently given less likelihood of this being infectious b. Patient will be monitored overnight.today c. Determine if he declares himself with fever or if there is no concerns of the same he can be discharged to skilled facilitya 3. Left gluteal hematoma a. Probably accounting for some of the hypotension patient had b. Still with continued given his history of 3 strokes anticoagulation with Eliquis but may be able to cut back dose to 2.5 mg dosing in the outpatient setting 4. AKI superimposed on CKD 3b a. Creatinine 3.3 on admission was hydrated b. Labs are  slightly improved and he is putting out reasonable amounts of urine no further work-up c. Would discontinue lisinopril 20 and HCTZ on discharge completely d. Discontinue normal saline 75 cc an hour and reassess labs in a.m. 5. DM TY 2 with nephropathy and neuropathy a. Continue sliding scale coverage at this time and b. Continue dulaglutide 1 pen weekly, Tresiba 50 daily, sliding scale insulin and will resume lispro 6 units nightly 6. Metabolic encephalopathy query delirium a. Etiology unclear but most likely secondary to being outside of his usual environment in addition to AKI b. Expect this will resolve but it will take time and may require him to be in his usual environment 7. Multiple cerebrovascular events a. Does need anticoagulation going forward 8. Normocytic anemia no further work-up past 9. Symptomatic B12 deficiency 10. Atrial fibrillation CHADS2 score >4 has bled score quite high in addition a. Not on rate controlling agents-would hold any other medication at this time  DVT prophylaxis: Eliquis Code Status: Full Family Communication: Discussed with wife at bedside Disposition:   Status is: Inpatient  Remains inpatient appropriate because:Ongoing diagnostic testing needed not appropriate for outpatient work up, Unsafe d/c plan and IV treatments appropriate due to intensity of illness or inability to take PO   Dispo: The patient is from: Home              Anticipated d/c is to: SNF              Anticipated d/c date is: 1 day              Patient currently is medically  stable to d/c.       Consultants:   None  Procedures: None currently  Antimicrobials: None   Subjective:  Awake alert  Patient still appears to be confused according to wife although it is getting a little better  Objective: Vitals:   05/07/20 2055 05/07/20 2110 05/08/20 0500 05/08/20 0528  BP: 117/69 (!) 122/54  (!) 113/59  Pulse: 91 88  91  Resp: 20 16  16   Temp: 97.9 F (36.6 C) 98.5  F (36.9 C)  98.4 F (36.9 C)  TempSrc: Oral Oral  Oral  SpO2: 100% 100%  99%  Weight:   80.4 kg   Height:       No intake or output data in the 24 hours ending 05/08/20 0707 Filed Weights   05/05/20 2212 05/08/20 0500  Weight: 75 kg 80.4 kg    Examination:  General exam: EOMI NCAT no focal deficit Respiratory system: Clear no added sound no rales no rhonchi Cardiovascular system: S1-S2 seems to be in A. fib no murmur rub or gallop Gastrointestinal system: Soft nontender no rebound no guarding. Central nervous system: Neurologically intact no focal deficit Extremities: No lower extremity edema Skin: Large hematoma from left hip all the way downMid calf area of left side Psychiatry: Euthymic congruent but some confusion  Data Reviewed: I have personally reviewed following labs and imaging studies White count 11.9 on admit-->8.7 Hemoglobin 9.3 Platelet 176 BUNs/creatinine 66/3.3-->44/1.7 LFTs normal Procalcitonin less than 0.10 Blood culture 10/18 - was negative, Urine culture not performed   Radiology Studies: CT HIP LEFT WO CONTRAST  Result Date: 05/06/2020 CLINICAL DATA:  Left hip pain and bruising since a fall approximately 1 week ago. Possible soft tissue infection. EXAM: CT OF THE LEFT HIP WITHOUT CONTRAST TECHNIQUE: Multidetector CT imaging of the left hip was performed according to the standard protocol. Multiplanar CT image reconstructions were also generated. COMPARISON:  Plain films left hip 05/06/2020 and 05/05/2020. FINDINGS: Bones/Joint/Cartilage The left hip is located. No fracture is identified. A 0.9 cm in diameter sclerotic lesion in the central aspect of the sacrum has an appearance most suggestive of a bone island. Minimal osteophytosis is seen about the left hip but the joint space is preserved. There is also some chondrocalcinosis present. No joint effusion is identified. Ligaments Suboptimally assessed by CT. Muscles and Tendons There is a hematoma in the  left gluteal musculature measuring approximately 8.5 cm craniocaudal by 8 cm transverse by 5 cm AP. There is no gas within the collection. Imaged musculature is otherwise unremarkable. Soft tissues There is stranding in subcutaneous fat adjacent to the left hip. No focal fluid collection. IMPRESSION: Hematoma in the left gluteal musculature as described above. Stranding in subcutaneous fat lateral to the left hip may be posttraumatic or could be due to cellulitis. Mild left hip osteoarthritis and chondrocalcinosis. No acute bony abnormality or joint effusion. Electronically Signed   By: Inge Rise M.D.   On: 05/06/2020 10:05     Scheduled Meds: . atorvastatin  40 mg Oral Daily  . cyanocobalamin  1,000 mcg Intramuscular Daily  . insulin aspart  0-9 Units Subcutaneous TID WC  . latanoprost  1 drop Left Eye QHS   Continuous Infusions: . sodium chloride 75 mL/hr at 05/06/20 1800  . piperacillin-tazobactam (ZOSYN)  IV 3.375 g (05/08/20 0529)     LOS: 2 days    Time spent: 25  Nita Sells, MD [facility] To contact the attending provider between 7A-7P or the covering provider during  after hours 7P-7A, please log into the web site [email or web address] and access using universal [place] password for that web site. If you do not have the password, please call the hospital operator.  05/08/2020, 7:07 AM

## 2020-05-08 NOTE — Progress Notes (Signed)
Physical Therapy Treatment Patient Details Name: Evan Stanley MRN: [id number] DOB: 13-Sep-1940 Today's Date: 05/08/2020    History of Present Illness Evan Stanley  is a 79 y.o. male, with history of CVA, hypertension, hyperlipidemia, diabetes mellitus type 2, CHF, A. fib presents to the ED with a chief complaint of altered mental status.  Patient is not able to provide any history.  He does not know why he is here.  He can tell that he he is at a doctor, but does not know the place or time.  Apparently wife had been at bedside at an earlier hour and had discussed with the other provider that patient had been falling, and fell on left hip.  He was seen in outside facility and prescribed Norco.  He started having confusion after he was prescribed Norco.  Wife reports that she has not given him Norco in the last 24 hours.  Patient at baseline ambulates with walker or cane, and is alert and oriented x3.  Patient reports that he is in no pain at this time.    PT Comments    Patient presents seated in chair (assisted by nursing staff) - spouse present at bedside.  Patient c/o severe pain in low back when leaning backwards in chair, requires verbal cues to keep trunk in neutral position with fair carryover, limited to a few steps at bedside with poor carryover for keeping hands on RW when back pain increases, unsafe to ambulate away from bedside and tolerated staying up in chair after therapy with his spouse in room - RN notified concerning low back pain and per spouse patient becomes confused with heavy pain medication.  Patient will benefit from continued physical therapy in hospital and recommended venue below to increase strength, balance, endurance for safe ADLs and gait.    Follow Up Recommendations  SNF     Equipment Recommendations  None recommended by PT    Recommendations for Other Services       Precautions / Restrictions Precautions Precautions: Fall Restrictions Weight Bearing  Restrictions: No    Mobility  Bed Mobility               General bed mobility comments: Presents in chair (assisted by nursing staff)  Transfers Overall transfer level: Needs assistance Equipment used: Rolling walker (2 wheeled) Transfers: Sit to/from [facility] Sit to Stand: Min assist;Mod assist Stand pivot transfers: Mod assist       General transfer comment: unsteady on feet with poor carryover for keeping hands on RW when back starts to hurt  Ambulation/Gait Ambulation/Gait assistance: Mod assist Gait Distance (Feet): 6 Feet Assistive device: Rolling walker (2 wheeled) Gait Pattern/deviations: Decreased step length - right;Decreased step length - left;Decreased stride length Gait velocity: decreased   General Gait Details: limited to taking steps at bedside due increasing back pain and poor carryover for keeping hands on RW to maintain standing balance   Stairs             Wheelchair Mobility    Modified Rankin (Stroke Patients Only)       Balance Overall balance assessment: Needs assistance Sitting-balance support: Feet supported;No upper extremity supported Sitting balance-Leahy Scale: Fair Sitting balance - Comments: seated in chair   Standing balance support: During functional activity;Bilateral upper extremity supported Standing balance-Leahy Scale: Poor Standing balance comment: fair/poor using RW  Cognition   Behavior During Therapy: WFL for tasks assessed/performed Overall Cognitive Status: Within Functional Limits for tasks assessed                                        Exercises General Exercises - Lower Extremity Long Arc Quad: Seated;AROM;Strengthening;Both;10 reps Toe Raises: Seated;AROM;Strengthening;Both;10 reps Heel Raises: Seated;AROM;Strengthening;Both;10 reps    General Comments        Pertinent Vitals/Pain Pain Assessment: Faces Faces Pain  Scale: Hurts whole lot Pain Location: low back with movement Pain Descriptors / Indicators: Grimacing;Guarding;Aching;Sore Pain Intervention(s): Limited activity within patient's tolerance;Monitored during session;Repositioned    Home Living                      Prior Function            PT Goals (current goals can now be found in the care plan section) Acute Rehab PT Goals PT Goal Formulation: With patient/family Time For Goal Achievement: 05/20/20 Potential to Achieve Goals: Good Progress towards PT goals: Progressing toward goals    Frequency    Min 3X/week      PT Plan      Co-evaluation              AM-PAC PT "6 Clicks" Mobility   Outcome Measure  Help needed turning from your back to your side while in a flat bed without using bedrails?: A Lot Help needed moving from lying on your back to sitting on the side of a flat bed without using bedrails?: A Lot Help needed moving to and from a bed to a chair (including a wheelchair)?: A Lot Help needed standing up from a chair using your arms (e.g., wheelchair or bedside chair)?: A Little Help needed to walk in hospital room?: A Lot Help needed climbing 3-5 steps with a railing? : A Lot 6 Click Score: 13    End of Session   Activity Tolerance: Patient tolerated treatment well;Patient limited by fatigue;Patient limited by pain Patient left: in chair;with call bell/phone within reach;with chair alarm set;with family/visitor present Nurse Communication: Mobility status PT Visit Diagnosis: Unsteadiness on feet (R26.81);Other abnormalities of gait and mobility (R26.89);Muscle weakness (generalized) (M62.81)     Time: 3143-8887 PT Time Calculation (min) (ACUTE ONLY): 26 min  Charges:  $Therapeutic Exercise: 8-22 mins $Therapeutic Activity: 8-22 mins                     4:03 PM, 05/08/20 Lonell Grandchild, MPT Physical Therapist with [facility] [phone] office [phone] mobile  phone

## 2020-05-08 NOTE — TOC Progression Note (Signed)
Transition of Care [facility]) - Progression Note    Patient Details  Name: Evan Stanley MRN: [id number] Date of Birth: Nov 22, 1940  [facility]) CM/SW Contact  Salome Arnt, [place] Phone Number: 05/08/2020, 11:42 AM  Clinical Narrative:  Josem Kaufmann received [phone]). LCSW confirmed that as long as pt d/c to SNF by tomorrow, Josem Kaufmann is valid. Mobile notified. LCSW also spoke with wife to provide update. TOC will continue to follow.     Expected Discharge Plan: [place] Barriers to Discharge: Continued Medical Work up  Expected Discharge Plan and Services Expected Discharge Plan: [place] In-house Referral: Clinical Social Work   Post Acute Care Choice: [place] Living arrangements for the past 2 months: Single Family Home                 DME Arranged: N/A         HH Arranged: NA           Social Determinants of Health (SDOH) Interventions    Readmission Risk Interventions No flowsheet data found.

## 2020-05-08 NOTE — Plan of Care (Signed)

## 2020-05-09 ENCOUNTER — Inpatient Hospital Stay
Admission: RE | Admit: 2020-05-09 | Discharge: 2020-05-21 | Disposition: A | Discharge status: Home / Self Care | Payer: Medicare Other | Source: Ambulatory Visit | Attending: Internal Medicine | Admitting: Internal Medicine

## 2020-05-09 DIAGNOSIS — G9341 Metabolic encephalopathy: Secondary | ICD-10-CM | POA: Diagnosis not present

## 2020-05-09 DIAGNOSIS — N179 Acute kidney failure, unspecified: Secondary | ICD-10-CM | POA: Diagnosis not present

## 2020-05-09 DIAGNOSIS — I5022 Chronic systolic (congestive) heart failure: Secondary | ICD-10-CM | POA: Diagnosis not present

## 2020-05-09 DIAGNOSIS — Z9181 History of falling: Secondary | ICD-10-CM | POA: Diagnosis not present

## 2020-05-09 DIAGNOSIS — M7981 Nontraumatic hematoma of soft tissue: Secondary | ICD-10-CM | POA: Diagnosis not present

## 2020-05-09 DIAGNOSIS — I69998 Other sequelae following unspecified cerebrovascular disease: Secondary | ICD-10-CM | POA: Diagnosis not present

## 2020-05-09 DIAGNOSIS — E785 Hyperlipidemia, unspecified: Secondary | ICD-10-CM | POA: Diagnosis not present

## 2020-05-09 DIAGNOSIS — E1159 Type 2 diabetes mellitus with other circulatory complications: Secondary | ICD-10-CM | POA: Diagnosis not present

## 2020-05-09 DIAGNOSIS — E1142 Type 2 diabetes mellitus with diabetic polyneuropathy: Secondary | ICD-10-CM | POA: Diagnosis not present

## 2020-05-09 DIAGNOSIS — I480 Paroxysmal atrial fibrillation: Secondary | ICD-10-CM | POA: Diagnosis not present

## 2020-05-09 DIAGNOSIS — Z741 Need for assistance with personal care: Secondary | ICD-10-CM | POA: Diagnosis not present

## 2020-05-09 DIAGNOSIS — I639 Cerebral infarction, unspecified: Secondary | ICD-10-CM | POA: Diagnosis not present

## 2020-05-09 DIAGNOSIS — I10 Essential (primary) hypertension: Secondary | ICD-10-CM | POA: Diagnosis not present

## 2020-05-09 DIAGNOSIS — Z7901 Long term (current) use of anticoagulants: Secondary | ICD-10-CM | POA: Diagnosis not present

## 2020-05-09 DIAGNOSIS — N1832 Chronic kidney disease, stage 3b: Secondary | ICD-10-CM | POA: Diagnosis not present

## 2020-05-09 DIAGNOSIS — M6281 Muscle weakness (generalized): Secondary | ICD-10-CM | POA: Diagnosis not present

## 2020-05-09 DIAGNOSIS — Z952 Presence of prosthetic heart valve: Secondary | ICD-10-CM | POA: Diagnosis not present

## 2020-05-09 DIAGNOSIS — I152 Hypertension secondary to endocrine disorders: Secondary | ICD-10-CM | POA: Diagnosis not present

## 2020-05-09 DIAGNOSIS — D500 Iron deficiency anemia secondary to blood loss (chronic): Secondary | ICD-10-CM | POA: Diagnosis not present

## 2020-05-09 DIAGNOSIS — E1143 Type 2 diabetes mellitus with diabetic autonomic (poly)neuropathy: Secondary | ICD-10-CM | POA: Diagnosis not present

## 2020-05-09 DIAGNOSIS — R262 Difficulty in walking, not elsewhere classified: Secondary | ICD-10-CM | POA: Diagnosis not present

## 2020-05-09 DIAGNOSIS — E1169 Type 2 diabetes mellitus with other specified complication: Secondary | ICD-10-CM | POA: Diagnosis not present

## 2020-05-09 DIAGNOSIS — E1129 Type 2 diabetes mellitus with other diabetic kidney complication: Secondary | ICD-10-CM | POA: Diagnosis not present

## 2020-05-09 DIAGNOSIS — H34232 Retinal artery branch occlusion, left eye: Secondary | ICD-10-CM | POA: Diagnosis not present

## 2020-05-09 DIAGNOSIS — T148XXA Other injury of unspecified body region, initial encounter: Secondary | ICD-10-CM | POA: Diagnosis not present

## 2020-05-09 DIAGNOSIS — I4891 Unspecified atrial fibrillation: Secondary | ICD-10-CM | POA: Diagnosis not present

## 2020-05-09 DIAGNOSIS — I130 Hypertensive heart and chronic kidney disease with heart failure and stage 1 through stage 4 chronic kidney disease, or unspecified chronic kidney disease: Secondary | ICD-10-CM | POA: Diagnosis not present

## 2020-05-09 DIAGNOSIS — E1122 Type 2 diabetes mellitus with diabetic chronic kidney disease: Secondary | ICD-10-CM | POA: Diagnosis not present

## 2020-05-09 LAB — CBC WITH DIFFERENTIAL/PLATELET
Abs Immature Granulocytes: 0.04 10*3/uL (ref 0.00–0.07)
Basophils Absolute: 0 10*3/uL (ref 0.0–0.1)
Basophils Relative: 1 %
Eosinophils Absolute: 0.1 10*3/uL (ref 0.0–0.5)
Eosinophils Relative: 2 %
HCT: 27.4 % — ABNORMAL LOW (ref 39.0–52.0)
Hemoglobin: 8.7 g/dL — ABNORMAL LOW (ref 13.0–17.0)
Immature Granulocytes: 1 %
Lymphocytes Relative: 13 %
Lymphs Abs: 1.1 10*3/uL (ref 0.7–4.0)
MCH: 27.7 pg (ref 26.0–34.0)
MCHC: 31.8 g/dL (ref 30.0–36.0)
MCV: 87.3 fL (ref 80.0–100.0)
Monocytes Absolute: 1.1 10*3/uL — ABNORMAL HIGH (ref 0.1–1.0)
Monocytes Relative: 13 %
Neutro Abs: 6.2 10*3/uL (ref 1.7–7.7)
Neutrophils Relative %: 70 %
Platelets: 173 10*3/uL (ref 150–400)
RBC: 3.14 MIL/uL — ABNORMAL LOW (ref 4.22–5.81)
RDW: 14.1 % (ref 11.5–15.5)
WBC: 8.6 10*3/uL (ref 4.0–10.5)
nRBC: 0 % (ref 0.0–0.2)

## 2020-05-09 LAB — COMPREHENSIVE METABOLIC PANEL
ALT: 24 U/L (ref 0–44)
AST: 22 U/L (ref 15–41)
Albumin: 2.9 g/dL — ABNORMAL LOW (ref 3.5–5.0)
Alkaline Phosphatase: 53 U/L (ref 38–126)
Anion gap: 8 (ref 5–15)
BUN: 31 mg/dL — ABNORMAL HIGH (ref 8–23)
CO2: 24 mmol/L (ref 22–32)
Calcium: 8.6 mg/dL — ABNORMAL LOW (ref 8.9–10.3)
Chloride: 108 mmol/L (ref 98–111)
Creatinine, Ser: 1.44 mg/dL — ABNORMAL HIGH (ref 0.61–1.24)
GFR, Estimated: 47 mL/min — ABNORMAL LOW (ref >60)
Glucose, Bld: 147 mg/dL — ABNORMAL HIGH (ref 70–99)
Potassium: 3.8 mmol/L (ref 3.5–5.1)
Sodium: 140 mmol/L (ref 135–145)
Total Bilirubin: 2 mg/dL — ABNORMAL HIGH (ref 0.3–1.2)
Total Protein: 5.7 g/dL — ABNORMAL LOW (ref 6.5–8.1)

## 2020-05-09 LAB — GLUCOSE, CAPILLARY
Glucose-Capillary: 123 mg/dL — ABNORMAL HIGH (ref 70–99)
Glucose-Capillary: 149 mg/dL — ABNORMAL HIGH (ref 70–99)

## 2020-05-09 LAB — PROCALCITONIN: Procalcitonin: <0.1 ng/mL

## 2020-05-09 LAB — SARS CORONAVIRUS 2 BY RT PCR (HOSPITAL ORDER, PERFORMED IN ~~LOC~~ HOSPITAL LAB): SARS Coronavirus 2: NEGATIVE

## 2020-05-09 MED ORDER — CYANOCOBALAMIN 1000 MCG/ML IJ SOLN: 1000.0000 ug | Freq: Every day | INTRAMUSCULAR | 0 refills | Status: AC

## 2020-05-09 MED ORDER — ACETAMINOPHEN 325 MG PO TABS: 650.0000 mg | ORAL_TABLET | Freq: Four times a day (QID) | ORAL | Status: AC | PRN

## 2020-05-09 NOTE — Plan of Care (Signed)
  Problem: Education: Goal: Knowledge of General Education information will improve Description: Including pain rating scale, medication(s)/side effects and non-pharmacologic comfort measures 05/09/2020 1750 by Santa Lighter, RN Outcome: Adequate for Discharge 05/09/2020 1749 by Santa Lighter, RN Outcome: Progressing   Problem: Clinical Measurements: Goal: Ability to maintain clinical measurements within normal limits will improve Outcome: Adequate for Discharge Goal: Will remain free from infection Outcome: Adequate for Discharge Goal: Diagnostic test results will improve Outcome: Adequate for Discharge Goal: Respiratory complications will improve Outcome: Adequate for Discharge Goal: Cardiovascular complication will be avoided Outcome: Adequate for Discharge   Problem: Activity: Goal: Risk for activity intolerance will decrease Outcome: Adequate for Discharge   Problem: Nutrition: Goal: Adequate nutrition will be maintained Outcome: Adequate for Discharge   Problem: Coping: Goal: Level of anxiety will decrease Outcome: Adequate for Discharge   Problem: Elimination: Goal: Will not experience complications related to bowel motility Outcome: Adequate for Discharge Goal: Will not experience complications related to urinary retention Outcome: Adequate for Discharge   Problem: Pain Managment: Goal: General experience of comfort will improve Outcome: Adequate for Discharge   Problem: Safety: Goal: Ability to remain free from injury will improve Outcome: Adequate for Discharge   Problem: Skin Integrity: Goal: Risk for impaired skin integrity will decrease Outcome: Adequate for Discharge

## 2020-05-09 NOTE — Plan of Care (Signed)
  Problem: Education: Goal: Knowledge of General Education information will improve Description Including pain rating scale, medication(s)/side effects and non-pharmacologic comfort measures Outcome: Progressing   Problem: Health Behavior/Discharge Planning: Goal: Ability to manage health-related needs will improve Outcome: Progressing   

## 2020-05-09 NOTE — Discharge Summary (Signed)
Physician Discharge Summary  Evan Stanley KZL:[id number] DOB: June 20, 1941 DOA: 05/05/2020  PCP: Evan Norlander, DO  Admit date: 05/05/2020 Discharge date: 05/09/2020  Time spent: 35 minutes  Recommendations for Outpatient Follow-up:  1. Consideration should be given in the outpatient setting to cutting back Eliquis dosing from 5 mg to 2.5 twice daily if hemoglobin drops below 7 otherwise continue the same dose 2. Needs CBC and Chem-12 in about 1 week 3. We will be discharging to get therapy at skilled rehab on discharge short-term 4. Please add possible amlodipine or clonidine for blood pressure control if above 390 systolic as other meds have been discontinued  Discharge Diagnoses:  Active Problems:   Type 2 diabetes mellitus (HCC)   AKI (acute kidney injury) ([place])   Stage 3 chronic kidney disease ([place])   History of TIA (transient ischemic attack)   Metabolic encephalopathy   Hematoma of muscle   Normocytic anemia due to blood loss   Falls   Discharge Condition: Fair  Diet recommendation: Diabetic heart healthy  Filed Weights   05/05/20 2212 05/08/20 0500  Weight: 75 kg 80.4 kg    History of present illness:  79 year old white male known prior stroke with left visual loss 3009 acute embolic stroke 08/22/3005/MAUQJ cerebellar CVA 2017 with frontoparietal infarct after angiogram and is status post right-sided CEA, bioprosthetic aortic valve 2010, prior smoker quit in 1988 CAD, chronic systolic heart failure with improvement to EF 65-70% 12/29/2019 and grade 1 diastolic dysfunction, DM TY 2, CKD stage III, HTN Patient has had prior folliculitis to the right gluteal area x2 [facility] with altered mental status unable to provide history-family shared patient had increasing encephalopathy and difficulty walking a week prior to admission Also found to have left hip pain complicated by AKI, left gluteal muscle hematoma  Hospital  Course:  1. Hypotension secondary to possible blood loss anemia 2. ?  Leukocytosis with question of infection on admission a. Zosyn was given 10/17 through 10/19 and discontinued subsequently given less likelihood of this being infectious b. No fevers or chills therefore patient stabilized for skilled facility discharge 3. Left gluteal hematoma a. Probably accounting for some of the hypotension patient had b. Still with continued given his history of 3 strokes anticoagulation with Eliquis but may be able to cut back dose to 2.5 mg dosing in the outpatient setting--- I will defer this to his outpatient physician 4. AKI superimposed on CKD 3b a. Creatinine 3.3 on admission was hydrated b. Improved significantly to 31/1.44 on discharge without issues c. discontinue lisinopril 20 and HCTZ on discharge completely d. Recheck labs in about a week 5. DM TY 2 with nephropathy and neuropathy a. Continue sliding scale coverage at this time and b. Apparently was not taking dulaglutide 1 pen weekly c. Continue as an outpatient Tresiba 50 daily, resume lispro 6 units  d. Recommend A1c 3 to 6 months 6. Metabolic encephalopathy query delirium a. Etiology unclear but most likely secondary to being outside of his usual environment in addition to AKI b. Also entertained was etiology of possible B12 deficiency for which patient was given B12 injections in the hospital c. Expect this will resolve but it will take time and may require him to be in his usual environment 7. Multiple cerebrovascular events a. Does need anticoagulation going forward 8. Normocytic anemia no further work-up past 9. Symptomatic B12 deficiency 1. Previous hospitalist started the patient B12 shots which will be continued as  per [facility] for a total of 7 days 2. After this if becomes confused in the outpatient setting may need to recheck the same 10. Atrial fibrillation CHADS2 score >4 has bled score quite high in addition a. Not on rate  controlling agents-would hold any other medication at this time  Procedures:  Multiple  Consultations:  None  Discharge Exam: Vitals:   05/08/20 0528 05/08/20 1400  BP: (!) 113/59 (!) 141/71  Pulse: 91 85  Resp: 16   Temp: 98.4 F (36.9 C) 98 F (36.7 C)  SpO2: 99% 98%    General: Patient is awake coherent but slightly confused at times although the confusion has improved he is more conversant per wife Cardiovascular: S1-S2 no murmur he seems to have some PVCs Respiratory: Clinically clear no rales no rhonchi Abdomen soft no rebound no guarding Neurologically intact no focal deficit Skin reviewed yesterday with extensive hematoma in the left flank left hip and going down left thigh  Discharge Instructions   Discharge Instructions    Diet - low sodium heart healthy   Complete by: As directed    Increase activity slowly   Complete by: As directed      Allergies as of 05/09/2020      Reactions   Baclofen Other (See Comments)   Stroke like symptoms   Morphine And Related Other (See Comments)   Delirium       Medication List    STOP taking these medications   atropine 1 % ophthalmic solution   B-D UF III MINI PEN NEEDLES 31G X 5 MM Misc Generic drug: Insulin Pen Needle   cetirizine 10 MG tablet Commonly known as: ZYRTEC   diclofenac Sodium 1 % Gel Commonly known as: Voltaren   hydrochlorothiazide 25 MG tablet Commonly known as: HYDRODIURIL   lisinopril 20 MG tablet Commonly known as: ZESTRIL   OneTouch Delica Plus [id number] Misc   OneTouch Ultra test strip Generic drug: glucose blood   Trulicity 1.5 CH/8.5ID Sopn Generic drug: Dulaglutide     TAKE these medications   acetaminophen 325 MG tablet Commonly known as: TYLENOL Take 2 tablets (650 mg total) by mouth every 6 (six) hours as needed for mild pain (or Fever >/= 101).   apixaban 5 MG Tabs tablet Commonly known as: ELIQUIS Take 1 tablet (5 mg total) by mouth 2 (two) times daily.    atorvastatin 40 MG tablet Commonly known as: LIPITOR TAKE 1 TABLET BY MOUTH  DAILY   cyanocobalamin 1000 MCG/ML injection Commonly known as: (VITAMIN B-12) Inject 1 mL (1,000 mcg total) into the muscle daily for 3 days. Start taking on: May 10, 2020   insulin lispro 100 UNIT/ML injection Commonly known as: HUMALOG Inject 6 Units into the skin daily before supper.   latanoprost 0.005 % ophthalmic solution Commonly known as: XALATAN Place 1 drop into the left eye at bedtime.   metoprolol succinate 100 MG 24 hr tablet Commonly known as: TOPROL-XL TAKE 1 TABLET BY MOUTH  DAILY WITH OR IMMEDIATELY  FOLLOWING A MEAL   Tresiba FlexTouch 200 UNIT/ML FlexTouch Pen Generic drug: insulin degludec Inject 50 Units into the skin daily.   vitamin C 500 MG tablet Commonly known as: ASCORBIC ACID Take 500 mg by mouth daily.   vitamin E 1000 UNIT capsule Generic drug: vitamin E Take 1,000 Units by mouth daily.      Allergies  Allergen Reactions  . Baclofen Other (See Comments)    Stroke like symptoms  . Morphine And Related  Other (See Comments)    Delirium     Contact information for after-discharge care    [facility] .   Service: Skilled Nursing Contact information: [address] [place] [phone]                   The results of significant diagnostics from this hospitalization (including imaging, microbiology, ancillary and laboratory) are listed below for reference.    Significant Diagnostic Studies: CT Head Wo Contrast  Result Date: 05/05/2020 CLINICAL DATA:  Acute onset altered mental status and gait disturbances at 11 a.m. today. EXAM: CT HEAD WITHOUT CONTRAST TECHNIQUE: Contiguous axial images were obtained from the base of the skull through the vertex without intravenous contrast. COMPARISON:  CT head 03/11/2020.  MRI brain 03/11/2020. FINDINGS: Brain: Diffuse cerebral atrophy.  Ventricular dilatation likely due to central atrophy. Low-attenuation changes in the deep white matter consistent with small vessel ischemia. No mass-effect or midline shift. No abnormal extra-axial fluid collections. Gray-white matter junctions are distinct. Basal cisterns are not effaced. No acute intracranial hemorrhage. Vascular: Moderate intracranial arterial calcifications are present. Skull: Calvarium appears intact. Sinuses/Orbits: Paranasal sinuses and mastoid air cells are clear. Other: No significant change since prior study. IMPRESSION: 1. No acute intracranial abnormalities. 2. Chronic atrophy and small vessel ischemia. Electronically Signed   By: Lucienne Capers M.D.   On: 05/05/2020 22:54   CT HIP LEFT WO CONTRAST  Result Date: 05/06/2020 CLINICAL DATA:  Left hip pain and bruising since a fall approximately 1 week ago. Possible soft tissue infection. EXAM: CT OF THE LEFT HIP WITHOUT CONTRAST TECHNIQUE: Multidetector CT imaging of the left hip was performed according to the standard protocol. Multiplanar CT image reconstructions were also generated. COMPARISON:  Plain films left hip 05/06/2020 and 05/05/2020. FINDINGS: Bones/Joint/Cartilage The left hip is located. No fracture is identified. A 0.9 cm in diameter sclerotic lesion in the central aspect of the sacrum has an appearance most suggestive of a bone island. Minimal osteophytosis is seen about the left hip but the joint space is preserved. There is also some chondrocalcinosis present. No joint effusion is identified. Ligaments Suboptimally assessed by CT. Muscles and Tendons There is a hematoma in the left gluteal musculature measuring approximately 8.5 cm craniocaudal by 8 cm transverse by 5 cm AP. There is no gas within the collection. Imaged musculature is otherwise unremarkable. Soft tissues There is stranding in subcutaneous fat adjacent to the left hip. No focal fluid collection. IMPRESSION: Hematoma in the left gluteal musculature  as described above. Stranding in subcutaneous fat lateral to the left hip may be posttraumatic or could be due to cellulitis. Mild left hip osteoarthritis and chondrocalcinosis. No acute bony abnormality or joint effusion. Electronically Signed   By: Inge Rise M.D.   On: 05/06/2020 10:05   DG Chest Port 1 View  Result Date: 05/06/2020 CLINICAL DATA:  Confusion.  Gait disturbance. EXAM: PORTABLE CHEST 1 VIEW COMPARISON:  03/11/2020. FINDINGS: Postoperative changes in the mediastinum. Shallow inspiration. Mild cardiac enlargement. Peribronchial thickening with streaky perihilar opacities possibly representing early viral pneumonia. No focal consolidation. No pleural effusions. No pneumothorax. Calcification of the aorta. Degenerative changes in the shoulders. IMPRESSION: Mild cardiac enlargement. Peribronchial thickening with streaky perihilar opacities possibly representing early viral pneumonia. Electronically Signed   By: Lucienne Capers M.D.   On: 05/06/2020 01:52   DG Hip Port [place] W or Wo Pelvis 1 View Left  Result Date: 05/06/2020  CLINICAL DATA:  79 year old male with fall and left hip pain. EXAM: DG HIP (WITH OR WITHOUT PELVIS) 1V PORT LEFT COMPARISON:  Left hip radiograph dated 04/25/2020. FINDINGS: There is no acute fracture or dislocation. The bones are osteopenic. Bilateral hip osteoarthritic changes. There is degenerative changes of the lower lumbar spine. The soft tissues are unremarkable. IMPRESSION: Negative. Electronically Signed   By: Anner Crete M.D.   On: 05/06/2020 03:50   DG HIP UNILAT W OR W/O PELVIS 2-3 VIEWS LEFT  Result Date: 04/26/2020 CLINICAL DATA:  Fall this morning with pain between trochanters. EXAM: DG HIP (WITH OR WITHOUT PELVIS) 2-3V LEFT COMPARISON:  None. FINDINGS: Mild symmetric osteoarthritic change of the hips. No acute fracture or dislocation. Mild calcified plaque over the femoral arteries. Degenerative change of the lumbar spine. IMPRESSION: 1. No  acute findings. 2. Mild symmetric osteoarthritic change of the hips. Electronically Signed   By: Marin Olp M.D.   On: 04/26/2020 10:33    Microbiology: Recent Results (from the past 240 hour(s))  Respiratory Panel by RT PCR (Flu A&B, Covid) - Nasopharyngeal Swab     Status: None   Collection Time: 05/06/20  2:27 AM   Specimen: Nasopharyngeal Swab  Result Value Ref Range Status   SARS Coronavirus 2 by RT PCR NEGATIVE NEGATIVE Final    Comment: (NOTE) SARS-CoV-2 target nucleic acids are NOT DETECTED.  The SARS-CoV-2 RNA is generally detectable in upper respiratoy specimens during the acute phase of infection. The lowest concentration of SARS-CoV-2 viral copies this assay can detect is 131 copies/mL. A negative result does not preclude SARS-Cov-2 infection and should not be used as the sole basis for treatment or other patient management decisions. A negative result may occur with  improper specimen collection/handling, submission of specimen other than nasopharyngeal swab, presence of viral mutation(s) within the areas targeted by this assay, and inadequate number of viral copies (<131 copies/mL). A negative result must be combined with clinical observations, patient history, and epidemiological information. The expected result is Negative.  Fact Sheet for Patients:  PinkCheek.be  Fact Sheet for Healthcare Providers:  GravelBags.it  This test is no t yet approved or cleared by the [place] FDA and  has been authorized for detection and/or diagnosis of SARS-CoV-2 by FDA under an Emergency Use Authorization (EUA). This EUA will remain  in effect (meaning this test can be used) for the duration of the COVID-19 declaration under Section 564(b)(1) of the Act, 21 U.S.C. section 360bbb-3(b)(1), unless the authorization is terminated or revoked sooner.     Influenza A by PCR NEGATIVE NEGATIVE Final   Influenza B by PCR  NEGATIVE NEGATIVE Final    Comment: (NOTE) The Xpert Xpress SARS-CoV-2/FLU/RSV assay is intended as an aid in  the diagnosis of influenza from Nasopharyngeal swab specimens and  should not be used as a sole basis for treatment. Nasal washings and  aspirates are unacceptable for Xpert Xpress SARS-CoV-2/FLU/RSV  testing.  Fact Sheet for Patients: PinkCheek.be  Fact Sheet for Healthcare Providers: GravelBags.it  This test is not yet approved or cleared by the [place] FDA and  has been authorized for detection and/or diagnosis of SARS-CoV-2 by  FDA under an Emergency Use Authorization (EUA). This EUA will remain  in effect (meaning this test can be used) for the duration of the  Covid-19 declaration under Section 564(b)(1) of the Act, 21  U.S.C. section 360bbb-3(b)(1), unless the authorization is  terminated or revoked. Performed at [facility], [address]  [place], [place] [address]   Culture, blood (routine x 2)     Status: None (Preliminary result)   Collection Time: 05/06/20  5:16 AM   Specimen: BLOOD  Result Value Ref Range Status   Specimen Description BLOOD  Final   Special Requests NONE  Final   Culture   Final    NO GROWTH 3 DAYS Performed at [facility], [address], [place], [place] [address]    Report Status PENDING  Incomplete  Culture, blood (routine x 2)     Status: None (Preliminary result)   Collection Time: 05/06/20  5:21 AM   Specimen: BLOOD  Result Value Ref Range Status   Specimen Description BLOOD  Final   Special Requests NONE  Final   Culture   Final    NO GROWTH 3 DAYS Performed at [facility], [address], [place], [place] [address]    Report Status PENDING  Incomplete     Labs: Basic Metabolic Panel: Recent Labs  Lab 05/06/20 0021 05/06/20 0826 05/07/20 0929 05/08/20 0549 05/09/20 0656  NA 136 139 141 141 140  K 3.8 3.8 3.9 3.7 3.8  CL 102 103 107 106 108  CO2 22  25 23 24 24   GLUCOSE 102* 86 96 145* 147*  BUN 66* 62* 55* 44* 31*  CREATININE 3.39* 2.80* 2.04* 1.74* 1.44*  CALCIUM 8.5* 8.3* 8.3* 8.6* 8.6*  MG  --  2.0  --   --   --    Liver Function Tests: Recent Labs  Lab 05/06/20 0021 05/06/20 0826 05/07/20 0929 05/08/20 0549 05/09/20 0656  AST 20 21 19 25 22   ALT 16 17 16 24 24   ALKPHOS 57 53 53 58 53  BILITOT 1.5* 1.9* 2.4* 2.7* 2.0*  PROT 5.8* 5.5* 5.5* 5.8* 5.7*  ALBUMIN 3.3* 3.0* 2.8* 3.0* 2.9*   No results for input(s): LIPASE, AMYLASE in the last 168 hours. No results for input(s): AMMONIA in the last 168 hours. CBC: Recent Labs  Lab 05/06/20 0826 05/06/20 1353 05/07/20 0929 05/08/20 0549 05/09/20 0656  WBC 10.3 11.2* 8.8 8.7 8.6  NEUTROABS 7.3  --   --  6.0 6.2  HGB 9.7* 9.8* 9.2* 9.3* 8.7*  HCT 29.2* 29.5* 28.3* 28.7* 27.4*  MCV 84.1 84.8 86.3 87.5 87.3  PLT 170 164 173 176 173   Cardiac Enzymes: No results for input(s): CKTOTAL, CKMB, CKMBINDEX, TROPONINI in the last 168 hours. BNP: BNP (last 3 results) No results for input(s): BNP in the last 8760 hours.  ProBNP (last 3 results) No results for input(s): PROBNP in the last 8760 hours.  CBG: Recent Labs  Lab 05/07/20 2056 05/08/20 0745 05/08/20 1121 05/08/20 1634 05/09/20 0826  GLUCAP 143* 127* 184* 152* 123*       Signed:  Nita Sells MD   [facility] 05/09/2020, 11:18 AM

## 2020-05-09 NOTE — TOC Transition Note (Signed)
Transition of Care [facility]) - CM/SW Discharge Note   Patient Details  Name: TANIA PERROTT MRN: [id number] Date of Birth: [birth year]  [facility] of [facility]) CM/SW Contact:  Natasha Bence, LCSW Phone Number: 05/09/2020, 10:47 AM   Clinical Narrative:    CSW confirmed with [facility] that they would be able to take patient today. Kerri with [facility] agreeable to take patient. CSW faxed d/c summary to [facility]. Nurse to call report.    Final next level of care: Skilled Nursing Facility Barriers to Discharge: Barriers Resolved   Patient Goals and CMS Choice Patient states their goals for this hospitalization and ongoing recovery are:: Rehab with SNF   Choice offered to / list presented to : Patient  Discharge Placement                Patient to be transferred to facility by: [facility] Name of family member notified: Phillis Knack Patient and family notified of of transfer: 05/09/20  Discharge Plan and Services In-house Referral: Clinical Social Work   Post Acute Care Choice: [place]          DME Arranged: N/A         HH Arranged: NA          Social Determinants of Health (SDOH) Interventions     Readmission Risk Interventions No flowsheet data found.

## 2020-05-09 NOTE — Care Management Important Message (Signed)
Important Message  Patient Details  Name: Evan Stanley MRN: [id number] Date of Birth: February 20, 1941   Medicare Important Message Given:  Yes Larene Beach, RN delivered)     Tommy Medal 05/09/2020, 2:53 PM

## 2020-05-09 NOTE — Progress Notes (Signed)
Nsg Discharge Note  Admit Date:  05/05/2020 Discharge date: 05/09/2020   Evan Stanley to be D/C'd Rehab per MD order.  AVS completed.  Copy for chart, and copy for patient signed, and dated. Report called to [facility]. IVs removed. Patient was brought to the tunnel via wheelchair. Patient/caregiver able to verbalize understanding.  Discharge Medication: Allergies as of 05/09/2020      Reactions   Baclofen Other (See Comments)   Stroke like symptoms   Morphine And Related Other (See Comments)   Delirium       Medication List    STOP taking these medications   atropine 1 % ophthalmic solution   B-D UF III MINI PEN NEEDLES 31G X 5 MM Misc Generic drug: Insulin Pen Needle   cetirizine 10 MG tablet Commonly known as: ZYRTEC   diclofenac Sodium 1 % Gel Commonly known as: Voltaren   hydrochlorothiazide 25 MG tablet Commonly known as: HYDRODIURIL   lisinopril 20 MG tablet Commonly known as: ZESTRIL   OneTouch Delica Plus [id number] Misc   OneTouch Ultra test strip Generic drug: glucose blood   Trulicity 1.5 WU/8.8BV Sopn Generic drug: Dulaglutide     TAKE these medications   acetaminophen 325 MG tablet Commonly known as: TYLENOL Take 2 tablets (650 mg total) by mouth every 6 (six) hours as needed for mild pain (or Fever >/= 101).   apixaban 5 MG Tabs tablet Commonly known as: ELIQUIS Take 1 tablet (5 mg total) by mouth 2 (two) times daily.   atorvastatin 40 MG tablet Commonly known as: LIPITOR TAKE 1 TABLET BY MOUTH  DAILY   cyanocobalamin 1000 MCG/ML injection Commonly known as: (VITAMIN B-12) Inject 1 mL (1,000 mcg total) into the muscle daily for 3 days. Start taking on: May 10, 2020   insulin lispro 100 UNIT/ML injection Commonly known as: HUMALOG Inject 6 Units into the skin daily before supper.   latanoprost 0.005 % ophthalmic solution Commonly known as: XALATAN Place 1 drop into the left eye at bedtime.   metoprolol succinate 100  MG 24 hr tablet Commonly known as: TOPROL-XL TAKE 1 TABLET BY MOUTH  DAILY WITH OR IMMEDIATELY  FOLLOWING A MEAL   Tresiba FlexTouch 200 UNIT/ML FlexTouch Pen Generic drug: insulin degludec Inject 50 Units into the skin daily.   vitamin C 500 MG tablet Commonly known as: ASCORBIC ACID Take 500 mg by mouth daily.   vitamin E 1000 UNIT capsule Generic drug: vitamin E Take 1,000 Units by mouth daily.       Discharge Assessment: Vitals:   05/08/20 0528 05/08/20 1400  BP: (!) 113/59 (!) 141/71  Pulse: 91 85  Resp: 16   Temp: 98.4 F (36.9 C) 98 F (36.7 C)  SpO2: 99% 98%   Skin clean, dry and intact without evidence of skin break down, no evidence of skin tears noted. IV catheter discontinued intact. Site without signs and symptoms of complications - no redness or edema noted at insertion site, patient denies c/o pain - only slight tenderness at site.  Dressing with slight pressure applied.  D/c Instructions-Education: Discharge instructions given to patient/family with verbalized understanding. D/c education completed with patient/family including follow up instructions, medication list, d/c activities limitations if indicated, with other d/c instructions as indicated by MD - patient able to verbalize understanding, all questions fully answered. Patient instructed to return to ED, call 911, or call MD for any changes in condition.  Patient escorted via [place], and D/C home via private auto.  Santa Lighter, RN 05/09/2020 5:59 PM

## 2020-05-09 NOTE — Progress Notes (Signed)
Called report to Karalee Height at [facility]

## 2020-05-10 ENCOUNTER — Non-Acute Institutional Stay (SKILLED_NURSING_FACILITY): Payer: Medicare Other | Admitting: Adult Health

## 2020-05-10 ENCOUNTER — Encounter: Payer: Self-pay | Admitting: Adult Health

## 2020-05-10 DIAGNOSIS — E1169 Type 2 diabetes mellitus with other specified complication: Secondary | ICD-10-CM

## 2020-05-10 DIAGNOSIS — I5022 Chronic systolic (congestive) heart failure: Secondary | ICD-10-CM

## 2020-05-10 DIAGNOSIS — I639 Cerebral infarction, unspecified: Secondary | ICD-10-CM

## 2020-05-10 DIAGNOSIS — E1122 Type 2 diabetes mellitus with diabetic chronic kidney disease: Secondary | ICD-10-CM | POA: Diagnosis not present

## 2020-05-10 DIAGNOSIS — H4052X3 Glaucoma secondary to other eye disorders, left eye, severe stage: Secondary | ICD-10-CM

## 2020-05-10 DIAGNOSIS — T148XXA Other injury of unspecified body region, initial encounter: Secondary | ICD-10-CM

## 2020-05-10 DIAGNOSIS — I152 Hypertension secondary to endocrine disorders: Secondary | ICD-10-CM

## 2020-05-10 DIAGNOSIS — E1159 Type 2 diabetes mellitus with other circulatory complications: Secondary | ICD-10-CM | POA: Diagnosis not present

## 2020-05-10 DIAGNOSIS — D500 Iron deficiency anemia secondary to blood loss (chronic): Secondary | ICD-10-CM

## 2020-05-10 DIAGNOSIS — E538 Deficiency of other specified B group vitamins: Secondary | ICD-10-CM

## 2020-05-10 DIAGNOSIS — N183 Chronic kidney disease, stage 3 unspecified: Secondary | ICD-10-CM

## 2020-05-10 DIAGNOSIS — I480 Paroxysmal atrial fibrillation: Secondary | ICD-10-CM

## 2020-05-10 DIAGNOSIS — N1832 Chronic kidney disease, stage 3b: Secondary | ICD-10-CM

## 2020-05-10 DIAGNOSIS — E785 Hyperlipidemia, unspecified: Secondary | ICD-10-CM

## 2020-05-10 DIAGNOSIS — Z794 Long term (current) use of insulin: Secondary | ICD-10-CM | POA: Insufficient documentation

## 2020-05-10 NOTE — Progress Notes (Signed)
Location:    [facility] Room Number: 126-P Place of Service:  SNF (31)   CODE STATUS: Full Code  Allergies  Allergen Reactions  . Baclofen Other (See Comments)    Stroke like symptoms  . Morphine And Related Other (See Comments)    Delirium     Chief Complaint  Patient presents with  . Hospitalization Follow-up    Follow-up from hospital visit from 05/05/2020-05/09/2020    HPI:  He is a 79 year old man who has been hospitalized from 05-05-20 through 05-09-20. He has a history of multiple cva's with left eye blindness; bioprosthetic aortic valve replacement. He was taken to the ED after a week increased weakness and altered mental status. He was hospitalized for acute blood loss anemia has a left gluteal hematoma; hypotension. He is here for short term rehab with his goal to return back home. He denies any pain; no weakness; no anxiety; no changes in appetite. He will continue to be followed for his chronic illnesses including: cva hypertension diabetes.    Past Medical History:  Diagnosis Date  . AKI (acute kidney injury) ([place])   . Allergy   . Anxiety   . Arthritis    Right shoulder  . Atrial fibrillation ([place])   . Carotid artery stenosis   . Cataract   . CHF (congestive heart failure) ([place])   . CVA (cerebral vascular accident) ([place]) 04/28/2016  . Decreased vision    left eye  . Diabetes mellitus without complication ([place])    Takes Metformin  . Hyperlipidemia   . Hypertension   . Salmonella bacteremia 04/29/2016  . Sepsis ([place]) 04/29/2016  . Stroke ([place]) 11/18/2015   no deficits  . TIA (transient ischemic attack)    affected left eye  . Urgency of urination     Past Surgical History:  Procedure Laterality Date  . AORTIC VALVE REPLACEMENT    . BACK SURGERY  80s  . CARDIAC VALVE REPLACEMENT  11/01/2008   21-mm Edwards Pericardial Magna - Ease valve  . ENDARTERECTOMY Right 01/13/2016   Procedure: ENDARTERECTOMY CAROTID - RIGHT;  Surgeon:  Conrad [place], MD;  Location: [facility];  Service: Vascular;  Laterality: Right;  . EYE SURGERY    . laser surgery, left eye  Left 10-29-2015    retinal surgery/ Deloria Lair MD   . PERIPHERAL VASCULAR CATHETERIZATION Right 11/18/2015   Procedure: Carotid Angiography;  Surgeon: Conrad Cheyney University, MD;  Location: [facility] CV [facility];  Service: Cardiovascular;  Laterality: Right;  . PERIPHERAL VASCULAR CATHETERIZATION N/A 11/18/2015   Procedure: Aortic Arch Angiography;  Surgeon: Conrad Graball, MD;  Location: [facility];  Service: Cardiovascular;  Laterality: N/A;  . TEE WITHOUT CARDIOVERSION N/A 05/05/2016   Procedure: TRANSESOPHAGEAL ECHOCARDIOGRAM (TEE);  Surgeon: Lelon Perla, MD;  Location: [facility];  Service: Cardiovascular;  Laterality: N/A;    Social History   Socioeconomic History  . Marital status: Married    Spouse name: Not on file  . Number of children: 2  . Years of education: 61  . Highest education level: 11th grade  Occupational History  . Occupation: maintenance    Employer: [employer]    Comment: Retired  Tobacco Use  . Smoking status: Former Smoker    Packs/day: 2.00    Years: 20.00    Pack years: 40.00    Types: Cigarettes    Quit date: 07/20/1986    Years since quitting: 33.8  . Smokeless tobacco: Never Used  Vaping Use  . Vaping Use: Never used  Substance and Sexual Activity  . Alcohol use: No    Alcohol/week: 0.0 standard drinks  . Drug use: No  . Sexual activity: Yes  Other Topics Concern  . Not on file  Social History Narrative  . Not on file   Social Determinants of Health   Financial Resource Strain:   . Difficulty of Paying Living Expenses: Not on file  Food Insecurity:   . Worried About Charity fundraiser in the Last Year: Not on file  . Ran Out of Food in the Last Year: Not on file  Transportation Needs:   . Lack of Transportation (Medical): Not on file  . Lack of Transportation (Non-Medical): Not on file  Physical Activity:   . Days of  Exercise per Week: Not on file  . Minutes of Exercise per Session: Not on file  Stress:   . Feeling of Stress : Not on file  Social Connections:   . Frequency of Communication with Friends and Family: Not on file  . Frequency of Social Gatherings with Friends and Family: Not on file  . Attends Religious Services: Not on file  . Active Member of Clubs or Organizations: Not on file  . Attends Archivist Meetings: Not on file  . Marital Status: Not on file  Intimate Partner Violence:   . Fear of Current or Ex-Partner: Not on file  . Emotionally Abused: Not on file  . Physically Abused: Not on file  . Sexually Abused: Not on file   Family History  Problem Relation Age of Onset  . Heart disease Mother   . Cancer Mother        breast  . Stroke Father 18  . Hypertension Father   . Early death Sister        infant death  . Heart disease Brother   . Hydrocephalus Brother   . Heart disease Brother   . Diabetes Brother   . Cancer Brother        prostat  . Healthy Son   . Healthy Son       VITAL SIGNS BP (!) 139/51   Pulse 92   Temp 98.1 F (36.7 C)   Resp 18   Ht 5' 5"  (1.651 m)   Wt 177 lb (80.3 kg)   SpO2 100%   BMI 29.45 kg/m   Outpatient Encounter Medications as of 05/10/2020  Medication Sig  . acetaminophen (TYLENOL) 325 MG tablet Take 2 tablets (650 mg total) by mouth every 6 (six) hours as needed for mild pain (or Fever >/= 101).  Marland Kitchen apixaban (ELIQUIS) 5 MG TABS tablet Take 1 tablet (5 mg total) by mouth 2 (two) times daily.  Marland Kitchen atorvastatin (LIPITOR) 40 MG tablet TAKE 1 TABLET BY MOUTH  DAILY  . cyanocobalamin (,VITAMIN B-12,) 1000 MCG/ML injection Inject 1 mL (1,000 mcg total) into the muscle daily for 3 days.  . insulin aspart (NOVOLOG FLEXPEN) 100 UNIT/ML FlexPen Inject 6 Units into the skin daily. With supper if blood sugar is greater than 150  . insulin degludec (TRESIBA FLEXTOUCH) 200 UNIT/ML FlexTouch Pen Inject 50 Units into the skin daily.  Marland Kitchen  latanoprost (XALATAN) 0.005 % ophthalmic solution Place 1 drop into the left eye at bedtime.   . metoprolol succinate (TOPROL-XL) 100 MG 24 hr tablet TAKE 1 TABLET BY MOUTH  DAILY WITH OR IMMEDIATELY  FOLLOWING A MEAL  . UNABLE TO FIND Diet: NAS and consistent carbohydrate  .  vitamin C (ASCORBIC ACID) 500 MG tablet Take 500 mg by mouth daily.  . vitamin E (VITAMIN E) 1000 UNIT capsule Take 1,000 Units by mouth daily.  . [DISCONTINUED] insulin lispro (HUMALOG) 100 UNIT/ML injection Inject 6 Units into the skin daily before supper.    No facility-administered encounter medications on file as of 05/10/2020.     SIGNIFICANT DIAGNOSTIC EXAMS  TODAY  05-05-20: ct of head:  1. No acute intracranial abnormalities. 2. Chronic atrophy and small vessel ischemia.  05-06-20: chest x-ray:  Mild cardiac enlargement. Peribronchial thickening with streaky perihilar opacities possibly representing early viral pneumonia.  05-06-20: left hip x-ray:  There is no acute fracture or dislocation. The bones are osteopenic. Bilateral hip osteoarthritic changes. There is degenerative changes of the lower lumbar spine. The soft tissues are unremarkable  05-06-20: ct of left hip:  Hematoma in the left gluteal musculature as described above. Stranding in subcutaneous fat lateral to the left hip may be posttraumatic or could be due to cellulitis. Mild left hip osteoarthritis and chondrocalcinosis. No acute bony abnormality or joint effusion.  LABS REVIEWED TODAY;   12-25-19: chol 89; ldl 47; trig 60; hdl 30; hgb a1c 7.4 05-06-20: wbc 11.9; hgb 10.2; hct 30.9; mcv 85.8 plt 169; glucose 102; bun 66; creat 3.39; k+ 3.8; na++ 136; ca 8.5 liver normal albumin 3.3 mag 2.0; tsh 1.036; vit B 12: 87 folate 11.3 urine drug screen + opiates  blood culture: no growth 05-07-20: wbc 8.8; hgb 9.2; hct 28.3; mcv 86.3 plt 173; glucose 96; bun 55; creat 2.04 ;k+ 3.9; na++ 141; ca 8.3; liver normal albumin 2.8 05-09-20: wbc 8.6; hgb  8.7; hct 27.4; mcv 87.3 plt 173; glucose 147; bun 31; creat 1.44; k+ 3.8; na++ 140; ca 8.6; liver normal albumin 2.9    Review of Systems  Constitutional: Negative for malaise/fatigue.  Respiratory: Negative for cough and shortness of breath.   Cardiovascular: Negative for chest pain, palpitations and leg swelling.  Gastrointestinal: Negative for abdominal pain, constipation and heartburn.  Musculoskeletal: Negative for back pain, joint pain and myalgias.  Skin: Negative.   Neurological: Negative for dizziness.  Psychiatric/Behavioral: The patient is not nervous/anxious.    Physical Exam Constitutional:      General: He is not in acute distress.    Appearance: He is well-developed. He is not diaphoretic.  HENT:     Head:     Comments: Blind left eye  Neck:     Thyroid: No thyromegaly.  Cardiovascular:     Rate and Rhythm: Normal rate and regular rhythm.     Pulses: Normal pulses.     Heart sounds: Normal heart sounds.  Pulmonary:     Effort: Pulmonary effort is normal. No respiratory distress.     Breath sounds: Normal breath sounds.  Abdominal:     General: Bowel sounds are normal. There is no distension.     Palpations: Abdomen is soft.     Tenderness: There is no abdominal tenderness.  Musculoskeletal:     Cervical back: Neck supple.     Right lower leg: No edema.     Left lower leg: No edema.     Comments: Is able to move all extremities   Lymphadenopathy:     Cervical: No cervical adenopathy.  Skin:    General: Skin is warm and dry.  Neurological:     Mental Status: He is alert and oriented to person, place, and time.  Psychiatric:        Mood  and Affect: Mood normal.       ASSESSMENT/ PLAN:  TODAY  1. Cerebrovascular accident (CVA) unspecified mechanism: is stable has history of multiple cva's with left eye blindness; will continue eliquis 5 mg twice daily   2. Hypertension associated with diabetes: is stable b/p 139/51 will continue toprol xl 100 mg  daily   3. Type 2 diabetes mellitus with stage 3b chronic kidney disease with long term current use of insulin: is stable hgb a1c 7.4. will continue tresiba 50 units nightly and novolog 6 units with supper  4. Hyperlipidemia associated w/ type 2 DM goal <70: is stable LDL 47 will continue lipitor 40 mg daily   5. Hematoma of muscle: left gluteal 8.5 x 8 x 5 cm: will monitor   6. Normocytic anemia due to blood loss: is stable hgb 8.7 will monitor   7. 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 Vitamin B 12 deficiency: level is 87 will continue injections daily for 3 more days; then supplement with po  9. Paroxysmal afib: heart rate is stable toprol xl 100 mg daily for rate control; is on long term eliquis 5 mg twice daily   10. Chronic systolic heart failure: EF 65-70% (12-25-19) is compensated   11. CKD stage 3 due to diabetes type 2 is stable bun 31; creat 1.44   12. Protein calorie malnutrition severe albumin 2.9: will begin prostat 30 cc twice daily    Will check cbc; bmp 05-13-20.      MD is aware of resident's narcotic use and is in agreement with current plan of care. We will attempt to wean resident as appropriate.  Ok Edwards NP [facility]  Contact [phone] Monday through Friday 8am- 5pm  After hours call [phone]

## 2020-05-11 LAB — CULTURE, BLOOD (ROUTINE X 2)
Culture: NO GROWTH
Culture: NO GROWTH

## 2020-05-13 ENCOUNTER — Encounter: Payer: Medicare Other | Admitting: Physical Therapy

## 2020-05-13 ENCOUNTER — Other Ambulatory Visit (HOSPITAL_COMMUNITY)
Admission: RE | Admit: 2020-05-13 | Discharge: 2020-05-13 | Disposition: A | Discharge status: Home / Self Care | Payer: Medicare Other | Source: Skilled Nursing Facility | Attending: Adult Health | Admitting: Adult Health

## 2020-05-13 DIAGNOSIS — I130 Hypertensive heart and chronic kidney disease with heart failure and stage 1 through stage 4 chronic kidney disease, or unspecified chronic kidney disease: Secondary | ICD-10-CM | POA: Diagnosis not present

## 2020-05-13 LAB — BASIC METABOLIC PANEL
Anion gap: 8 (ref 5–15)
BUN: 41 mg/dL — ABNORMAL HIGH (ref 8–23)
CO2: 25 mmol/L (ref 22–32)
Calcium: 8.8 mg/dL — ABNORMAL LOW (ref 8.9–10.3)
Chloride: 106 mmol/L (ref 98–111)
Creatinine, Ser: 1.47 mg/dL — ABNORMAL HIGH (ref 0.61–1.24)
GFR, Estimated: 49 mL/min — ABNORMAL LOW (ref >60)
Glucose, Bld: 92 mg/dL (ref 70–99)
Potassium: 3.8 mmol/L (ref 3.5–5.1)
Sodium: 139 mmol/L (ref 135–145)

## 2020-05-13 LAB — CBC
HCT: 29 % — ABNORMAL LOW (ref 39.0–52.0)
Hemoglobin: 9.2 g/dL — ABNORMAL LOW (ref 13.0–17.0)
MCH: 28.1 pg (ref 26.0–34.0)
MCHC: 31.7 g/dL (ref 30.0–36.0)
MCV: 88.7 fL (ref 80.0–100.0)
Platelets: 241 10*3/uL (ref 150–400)
RBC: 3.27 MIL/uL — ABNORMAL LOW (ref 4.22–5.81)
RDW: 14.9 % (ref 11.5–15.5)
WBC: 8.2 10*3/uL (ref 4.0–10.5)
nRBC: 0 % (ref 0.0–0.2)

## 2020-05-14 ENCOUNTER — Encounter: Payer: Self-pay | Admitting: Internal Medicine

## 2020-05-14 ENCOUNTER — Non-Acute Institutional Stay (SKILLED_NURSING_FACILITY): Payer: Medicare Other | Admitting: Internal Medicine

## 2020-05-14 DIAGNOSIS — I480 Paroxysmal atrial fibrillation: Secondary | ICD-10-CM

## 2020-05-14 DIAGNOSIS — D500 Iron deficiency anemia secondary to blood loss (chronic): Secondary | ICD-10-CM | POA: Diagnosis not present

## 2020-05-14 DIAGNOSIS — N179 Acute kidney failure, unspecified: Secondary | ICD-10-CM | POA: Diagnosis not present

## 2020-05-14 DIAGNOSIS — G9341 Metabolic encephalopathy: Secondary | ICD-10-CM | POA: Diagnosis not present

## 2020-05-14 DIAGNOSIS — E1142 Type 2 diabetes mellitus with diabetic polyneuropathy: Secondary | ICD-10-CM | POA: Diagnosis not present

## 2020-05-14 NOTE — Assessment & Plan Note (Addendum)
Despite acute blood loss anemia; Eliquis has been continued at 5 mg twice daily because of history of at least 3 strokes.  It was recommended that decreasing the dose to 2.5 mg twice daily be considered should hemoglobin consistently be less than 7. H/H 9.2/29 @ discharge

## 2020-05-14 NOTE — Assessment & Plan Note (Addendum)
04/10/2020 hemoglobin A1c 6.4%.  Fasting blood glucoses range from 101 up to 125.  AC evening glucoses range from 141 up to 209.  He is receiving NovoLog 6 units before the evening meal if glucose is greater than 150. With CKD stage IIIb; there may be discordance between glucoses and A1c.

## 2020-05-14 NOTE — Assessment & Plan Note (Signed)
No AMS changes @ present

## 2020-05-14 NOTE — Progress Notes (Signed)
NURSING HOME LOCATION:  [facility] NUMBER:  126/P  CODE STATUS: Full Code   PCP:  [name], DO  This is a comprehensive admission note to [facility] performed on this date less than 30 days from date of admission. Included are preadmission medical/surgical history; reconciled medication list; family history; social history and comprehensive review of systems.  Corrections and additions to the records were documented. Comprehensive physical exam was also performed. Additionally a clinical summary was entered for each active diagnosis pertinent to this admission in the Problem List to enhance continuity of care.  HPI: Patient was hospitalized 10/17-10/21/2021 admitted from home with AMS.  Family stated the patient had increasing encephalopathic behavior and difficulty walking for a week PTA.  He was found to be hypotensive related to blood loss anemia.  Zosyn was initiated 10/17-10/19 for leukocytosis; no definite etiology was found.  Left gluteal hematoma was documented; was felt that this was a contributing factor to his hypotension. Despite the anemia; Eliquis was continued given his history of 3 prior strokes. Course was complicated by AKI superimposed on CKD stage IIIb.  With hydration creatinine of 3.3 at admission improved to 1.44 at discharge.  GFR was 31. Sliding scale was continued for diabetes complicated by neuropathy and nephropathy. The etiology of his encephalopathic state was unclear but it was felt that AKI was contributing factor.  Additionally he was found to have B12 deficiency for which injections were initiated as an inpatient. SNF placement recommended for rehab. It was recommended that were the hemoglobin drop below 7 that Eliquis dose be decreased from 5 mg to 2.5 mg twice daily.  Past medical and surgical history: Includes visual loss to TIA; history of prior strokes; history of Salmonella bacteremia; essential hypertension,  dyslipidemia, history of carotid artery stenosis, and A. Fib. He has had multiple procedures and surgeries including aortic valve replacement, right carotid endarterectomy, and TEE without cardioversion.  Social history: Nondrinker; former 40-pack-year smoker.  Family history: Extensive history reviewed; this is noncontributory due to advanced age.   Review of systems: He knew the date and the year but not the day of the month.  Initially he declined to name the president but subsequently did correctly but adding an expletive.  He continues to have left hip pain from the fall.  He states that he was walking down his driveway to get the newspaper when he became dizzy and fell.  He denied any definite cardiac or neurologic prodrome prior to the fall.  He denies any postural hypotension symptoms.  The remainder of the review of systems is negative. Constitutional: No fever, significant weight change, fatigue  Eyes: No redness, discharge, pain, vision change ENT/mouth: No nasal congestion, purulent discharge, earache, change in hearing, sore throat  Cardiovascular: No chest pain, palpitations, paroxysmal nocturnal dyspnea, claudication, edema  Respiratory: No cough, sputum production, hemoptysis, DOE, significant snoring, apnea Gastrointestinal: No heartburn, dysphagia, abdominal pain, nausea /vomiting, rectal bleeding, melena, change in bowels Genitourinary: No dysuria, hematuria, pyuria, incontinence, nocturia Musculoskeletal: No joint stiffness, joint swelling, weakness, pain Dermatologic: No rash, pruritus, change in appearance of skin Neurologic: No dizziness, headache, syncope, seizures, numbness, tingling Psychiatric: No significant anxiety, depression, insomnia, anorexia Endocrine: No change in hair/skin/nails, excessive thirst, excessive hunger, excessive urination  Hematologic/lymphatic: No significant bruising, lymphadenopathy, abnormal bleeding Allergy/immunology: No itchy/watery eyes,  significant sneezing, urticaria, angioedema  Physical exam:  Pertinent or positive findings: He appears her stated age.  Pattern alopecia is present.  He has  a Cabin crew.  There is ptosis greater on the left than the right.  The left pupil appears to be slightly eccentric.  Vision is decreased to confrontation on the left.  He is wearing only the upper plate.  He has venous spiders over the cheeks.  Grade 1/2 systolic murmur is present.  Loud carotid bruits are noted.  Breath sounds are decreased.  Pedal pulses are decreased but palpable.  Posterior tibial pulses are stronger than dorsalis pedis pulses.  Strength opposition is good although this resulted in pain in the left lower extremity.  There is extensive ecchymosis of the left lateral thigh. General appearance: Adequately nourished; no acute distress, increased work of breathing is present.   Lymphatic: No lymphadenopathy about the head, neck, axilla. Eyes: No conjunctival inflammation or lid edema is present. There is no scleral icterus. Ears:  External ear exam shows no significant lesions or deformities.   Nose:  External nasal examination shows no deformity or inflammation. Nasal mucosa are pink and moist without lesions, exudates Oral exam: Lips and gums are healthy appearing.There is no oropharyngeal erythema or exudate. Neck:  No thyromegaly, masses, tenderness noted.    Heart:  Normal rate and regular rhythm. S1 and S2 normal without gallop, murmur, click, rub.  Lungs: Chest clear to auscultation without wheezes, rhonchi, rales, rubs. Abdomen: Bowel sounds are normal.  Abdomen is soft and nontender with no organomegaly, hernias, masses. GU: Deferred  Extremities:  No cyanosis, clubbing, edema. Neurologic exam:  Strength equal  in upper & lower extremities. Balance, Rhomberg, finger to nose testing could not be completed due to clinical state Deep tendon reflexes are equal Skin: Warm & dry w/o tenting. No significant  lesions or rash.  See clinical summary under each active problem in the Problem List with associated updated therapeutic plan

## 2020-05-14 NOTE — Assessment & Plan Note (Addendum)
05/05/2020 AKI superimposed on CKD stage IIIb.  Admission creatinine 3.3; creatinine 1.44 at discharge post rehydration.

## 2020-05-14 NOTE — Patient Instructions (Signed)
See assessment and plan under each diagnosis in the problem list and acutely for this visit 

## 2020-05-14 NOTE — Assessment & Plan Note (Signed)
Clinically rhythm is regular & rate controlled

## 2020-05-15 ENCOUNTER — Encounter: Payer: Medicare Other | Admitting: Physical Therapy

## 2020-05-17 ENCOUNTER — Non-Acute Institutional Stay (SKILLED_NURSING_FACILITY): Payer: Medicare Other | Admitting: Adult Health

## 2020-05-17 ENCOUNTER — Encounter: Payer: Self-pay | Admitting: Adult Health

## 2020-05-17 DIAGNOSIS — E1159 Type 2 diabetes mellitus with other circulatory complications: Secondary | ICD-10-CM

## 2020-05-17 DIAGNOSIS — E1122 Type 2 diabetes mellitus with diabetic chronic kidney disease: Secondary | ICD-10-CM

## 2020-05-17 DIAGNOSIS — Z794 Long term (current) use of insulin: Secondary | ICD-10-CM

## 2020-05-17 DIAGNOSIS — I152 Hypertension secondary to endocrine disorders: Secondary | ICD-10-CM | POA: Diagnosis not present

## 2020-05-17 DIAGNOSIS — N1832 Chronic kidney disease, stage 3b: Secondary | ICD-10-CM | POA: Diagnosis not present

## 2020-05-17 DIAGNOSIS — I639 Cerebral infarction, unspecified: Secondary | ICD-10-CM

## 2020-05-17 NOTE — Progress Notes (Signed)
Location:    [facility] Room Number: 126-P Place of Service:  SNF (31)   CODE STATUS: Full Code   Allergies  Allergen Reactions  . Baclofen Other (See Comments)    Stroke like symptoms  . Morphine And Related Other (See Comments)    Delirium     Chief Complaint  Patient presents with  . Routine Follow-up         Cerebrovascular accident (CVA): unspecified mechanism:   Hypertension associated with diabetes:  Type 2 diabetes mellitus with stage 3b chronic kidney disease with long term current use of insulin   Weekly follow up for the first 30 days post hospitalization.     HPI:  He is a 79 year old short term rehab patient being seen for the management of her chronic illnesses: Cerebrovascular accident (CVA): unspecified mechanism:   Hypertension associated with diabetes:  Type 2 diabetes mellitus with stage 3b chronic kidney disease with long term current use of insulin. There are no reports of uncontrolled pain; no changes in appetite; no insomnia. He continues to participate in therapy.   Past Medical History:  Diagnosis Date  . AKI (acute kidney injury) ([place])   . Allergy   . Anxiety   . Arthritis    Right shoulder  . Atrial fibrillation ([place])   . Carotid artery stenosis   . Cataract   . CHF (congestive heart failure) ([place])   . CVA (cerebral vascular accident) ([place]) 04/28/2016  . Decreased vision    left eye  . Diabetes mellitus without complication ([place])    Takes Metformin  . Hyperlipidemia   . Hypertension   . Salmonella bacteremia 04/29/2016  . Sepsis ([place]) 04/29/2016  . Stroke ([place]) 11/18/2015   no deficits  . TIA (transient ischemic attack)    affected left eye  . Urgency of urination     Past Surgical History:  Procedure Laterality Date  . AORTIC VALVE REPLACEMENT    . BACK SURGERY  80s  . CARDIAC VALVE REPLACEMENT  11/01/2008   21-mm Edwards Pericardial Magna - Ease valve  . ENDARTERECTOMY Right 01/13/2016   Procedure:  ENDARTERECTOMY CAROTID - RIGHT;  Surgeon: Conrad De [place]oto, MD;  Location: [facility];  Service: Vascular;  Laterality: Right;  . EYE SURGERY    . laser surgery, left eye  Left 10-29-2015    retinal surgery/ Deloria Lair MD   . PERIPHERAL VASCULAR CATHETERIZATION Right 11/18/2015   Procedure: Carotid Angiography;  Surgeon: Conrad [place], MD;  Location: [facility] CV LAB;  Service: Cardiovascular;  Laterality: Right;  . PERIPHERAL VASCULAR CATHETERIZATION N/A 11/18/2015   Procedure: Aortic Arch Angiography;  Surgeon: Conrad [place], MD;  Location: [facility] CV LAB;  Service: Cardiovascular;  Laterality: N/A;  . TEE WITHOUT CARDIOVERSION N/A 05/05/2016   Procedure: TRANSESOPHAGEAL ECHOCARDIOGRAM (TEE);  Surgeon: Lelon Perla, MD;  Location: [facility] ENDOSCOPY;  Service: Cardiovascular;  Laterality: N/A;    Social History   Socioeconomic History  . Marital status: Married    Spouse name: Not on file  . Number of children: 2  . Years of education: 78  . Highest education level: 11th grade  Occupational History  . Occupation: maintenance    Employer: [employer]    Comment: Retired  Tobacco Use  . Smoking status: Former Smoker    Packs/day: 2.00    Years: 20.00    Pack years: 40.00    Types: Cigarettes    Quit date: 07/20/1986    Years  since quitting: 33.8  . Smokeless tobacco: Never Used  Vaping Use  . Vaping Use: Never used  Substance and Sexual Activity  . Alcohol use: No    Alcohol/week: 0.0 standard drinks  . Drug use: No  . Sexual activity: Yes  Other Topics Concern  . Not on file  Social History Narrative  . Not on file   Social Determinants of Health   Financial Resource Strain:   . Difficulty of Paying Living Expenses: Not on file  Food Insecurity:   . Worried About Charity fundraiser in the Last Year: Not on file  . Ran Out of Food in the Last Year: Not on file  Transportation Needs:   . Lack of Transportation (Medical): Not on file  . Lack of Transportation (Non-Medical): Not on  file  Physical Activity:   . Days of Exercise per Week: Not on file  . Minutes of Exercise per Session: Not on file  Stress:   . Feeling of Stress : Not on file  Social Connections:   . Frequency of Communication with Friends and Family: Not on file  . Frequency of Social Gatherings with Friends and Family: Not on file  . Attends Religious Services: Not on file  . Active Member of Clubs or Organizations: Not on file  . Attends Archivist Meetings: Not on file  . Marital Status: Not on file  Intimate Partner Violence:   . Fear of Current or Ex-Partner: Not on file  . Emotionally Abused: Not on file  . Physically Abused: Not on file  . Sexually Abused: Not on file   Family History  Problem Relation Age of Onset  . Heart disease Mother   . Cancer Mother        breast  . Stroke Father 63  . Hypertension Father   . Early death Sister        infant death  . Heart disease Brother   . Hydrocephalus Brother   . Heart disease Brother   . Diabetes Brother   . Cancer Brother        prostat  . Healthy Son   . Healthy Son       VITAL SIGNS BP 132/72   Pulse 72   Temp 98.2 F (36.8 C)   Resp 18   Ht 5' 5"  (1.651 m)   Wt 165 lb (74.8 kg)   SpO2 99%   BMI 27.46 kg/m   Outpatient Encounter Medications as of 05/17/2020  Medication Sig  . acetaminophen (TYLENOL) 325 MG tablet Take 2 tablets (650 mg total) by mouth every 6 (six) hours as needed for mild pain (or Fever >/= 101).  . Amino Acids-Protein Hydrolys (PRO-STAT 64 PO) Take 30 mLs by mouth 2 (two) times daily.  Marland Kitchen apixaban (ELIQUIS) 5 MG TABS tablet Take 1 tablet (5 mg total) by mouth 2 (two) times daily.  Marland Kitchen atorvastatin (LIPITOR) 40 MG tablet TAKE 1 TABLET BY MOUTH  DAILY  . cyanocobalamin 1000 MCG tablet Take 1,000 mcg by mouth daily.  . insulin aspart (NOVOLOG FLEXPEN) 100 UNIT/ML FlexPen Inject 6 Units into the skin daily. With supper if blood sugar is greater than 150  . insulin degludec (TRESIBA FLEXTOUCH)  200 UNIT/ML FlexTouch Pen Inject 50 Units into the skin daily.  Marland Kitchen latanoprost (XALATAN) 0.005 % ophthalmic solution Place 1 drop into the left eye at bedtime.   . metoprolol succinate (TOPROL-XL) 100 MG 24 hr tablet TAKE 1 TABLET BY MOUTH  DAILY  WITH OR IMMEDIATELY  FOLLOWING A MEAL  . UNABLE TO FIND Diet: NAS and consistent carbohydrate  . vitamin C (ASCORBIC ACID) 500 MG tablet Take 500 mg by mouth daily.  . vitamin E (VITAMIN E) 1000 UNIT capsule Take 1,000 Units by mouth daily.   No facility-administered encounter medications on file as of 05/17/2020.     SIGNIFICANT DIAGNOSTIC EXAMS  PREVIOUS   05-05-20: ct of head:  1. No acute intracranial abnormalities. 2. Chronic atrophy and small vessel ischemia.  05-06-20: chest x-ray:  Mild cardiac enlargement. Peribronchial thickening with streaky perihilar opacities possibly representing early viral pneumonia.  05-06-20: left hip x-ray:  There is no acute fracture or dislocation. The bones are osteopenic. Bilateral hip osteoarthritic changes. There is degenerative changes of the lower lumbar spine. The soft tissues are unremarkable  05-06-20: ct of left hip:  Hematoma in the left gluteal musculature as described above. Stranding in subcutaneous fat lateral to the left hip may be posttraumatic or could be due to cellulitis. Mild left hip osteoarthritis and chondrocalcinosis. No acute bony abnormality or joint effusion.  NO NEW EXAMS.   LABS REVIEWED PREVIOUS   12-25-19: chol 89; ldl 47; trig 60; hdl 30; hgb a1c 7.4 05-06-20: wbc 11.9; hgb 10.2; hct 30.9; mcv 85.8 plt 169; glucose 102; bun 66; creat 3.39; k+ 3.8; na++ 136; ca 8.5 liver normal albumin 3.3 mag 2.0; tsh 1.036; vit B 12: 87 folate 11.3 urine drug screen + opiates  blood culture: no growth 05-07-20: wbc 8.8; hgb 9.2; hct 28.3; mcv 86.3 plt 173; glucose 96; bun 55; creat 2.04 ;k+ 3.9; na++ 141; ca 8.3; liver normal albumin 2.8 05-09-20: wbc 8.6; hgb 8.7; hct 27.4; mcv 87.3  plt 173; glucose 147; bun 31; creat 1.44; k+ 3.8; na++ 140; ca 8.6; liver normal albumin 2.9   TODAY  05-13-20: wbc 8.2; hgb 9.2; hct 29.0; mcv 88.7 plt 241; glucose 92; bun 41; creat 1.47; k+ 3.8; na++ 139; ca 8.8    Review of Systems  Constitutional: Negative for malaise/fatigue.  Respiratory: Negative for cough and shortness of breath.   Cardiovascular: Negative for chest pain, palpitations and leg swelling.  Gastrointestinal: Negative for abdominal pain, constipation and heartburn.  Musculoskeletal: Negative for back pain, joint pain and myalgias.  Skin: Negative.   Neurological: Negative for dizziness.  Psychiatric/Behavioral: The patient is not nervous/anxious.    .   Physical Exam Constitutional:      General: He is not in acute distress.    Appearance: He is well-developed. He is not diaphoretic.  Eyes:     Comments: Left eye blind  Neck:     Thyroid: No thyromegaly.  Cardiovascular:     Rate and Rhythm: Normal rate and regular rhythm.     Heart sounds: Normal heart sounds.  Pulmonary:     Effort: Pulmonary effort is normal. No respiratory distress.     Breath sounds: Normal breath sounds.  Abdominal:     General: Bowel sounds are normal. There is no distension.     Palpations: Abdomen is soft.     Tenderness: There is no abdominal tenderness.  Musculoskeletal:        General: Normal range of motion.     Right lower leg: No edema.     Left lower leg: No edema.  Lymphadenopathy:     Cervical: No cervical adenopathy.  Skin:    General: Skin is warm and dry.  Neurological:     Mental Status: He is alert and  oriented to person, place, and time.  Psychiatric:        Mood and Affect: Mood normal.      ASSESSMENT/ PLAN:  TODAY  1. Cerebrovascular accident (CVA): unspecified mechanism: is stable has history of multiple cva's with left eye blindness: will continue eliquis 5 mg twice daily   2. Hypertension associated with diabetes: is stable 132/72 will  continue toprol xl 100 mg daily   3. Type 2 diabetes mellitus with stage 3b chronic kidney disease with long term current use of insulin: is stable hgb a1c 7.4 will continue tresiba 50 units nightly and novolog 6 units with supper.   PREVIOUS   4. Hyperlipidemia associated w/ type 2 DM goal <70: is stable LDL 47 will continue lipitor 40 mg daily   5. Hematoma of muscle: left gluteal 8.5 x 8 x 5 cm: will monitor   6. Normocytic anemia due to blood loss: is stable hgb 8.7 will monitor   7. 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 Vitamin B 12 deficiency: level is 87 will continue po supplement   9. Paroxysmal afib: heart rate is stable toprol xl 100 mg daily for rate control; is on long term eliquis 5 mg twice daily   10. Chronic systolic heart failure: EF 65-70% (12-25-19) is compensated   11. CKD stage 3 due to diabetes type 2 is stable bun 31; creat 1.44   12. Protein calorie malnutrition severe albumin 2.9: will continue  prostat 30 cc twice daily           MD is aware of resident's narcotic use and is in agreement with current plan of care. We will attempt to wean resident as appropriate.  Ok Edwards NP [facility]  Contact [phone] Monday through Friday 8am- 5pm  After hours call [phone]

## 2020-05-20 ENCOUNTER — Non-Acute Institutional Stay (SKILLED_NURSING_FACILITY): Payer: Medicare Other | Admitting: Adult Health

## 2020-05-20 ENCOUNTER — Encounter: Payer: Self-pay | Admitting: Adult Health

## 2020-05-20 ENCOUNTER — Telehealth: Payer: Self-pay

## 2020-05-20 DIAGNOSIS — I639 Cerebral infarction, unspecified: Secondary | ICD-10-CM

## 2020-05-20 DIAGNOSIS — H34232 Retinal artery branch occlusion, left eye: Secondary | ICD-10-CM

## 2020-05-20 DIAGNOSIS — I5022 Chronic systolic (congestive) heart failure: Secondary | ICD-10-CM

## 2020-05-20 DIAGNOSIS — E1159 Type 2 diabetes mellitus with other circulatory complications: Secondary | ICD-10-CM | POA: Diagnosis not present

## 2020-05-20 DIAGNOSIS — I152 Hypertension secondary to endocrine disorders: Secondary | ICD-10-CM

## 2020-05-20 NOTE — Progress Notes (Signed)
Location:    [facility] Room Number: 126/P Place of Service:  SNF (31)   CODE STATUS: Full Code  Allergies  Allergen Reactions   Baclofen Other (See Comments)    Stroke like symptoms   Morphine And Related Other (See Comments)    Delirium     Chief Complaint  Patient presents with   Discharge Note    Discharge Visit    HPI:  He is being discharged to home with home health for pt/ot. He will not need dme. He will need his prescriptions written and will need to follow up with his medical provider. He was hospitalized due to increased weakness found to have anemia and left gluteal hematoma. He was admitted to this facility for short term rehab. He particed in therapy and is now ready for discharge to home with home health therapy.   Past Medical History:  Diagnosis Date   AKI (acute kidney injury) [facility])    Allergy    Anxiety    Arthritis    Right shoulder   Atrial fibrillation ([facility])    Carotid artery stenosis    Cataract    CHF (congestive heart failure) ([facility])    CVA (cerebral vascular accident) ([place]) 04/28/2016   Decreased vision    left eye   Diabetes mellitus without complication ([place])    Takes Metformin   Hyperlipidemia    Hypertension    Salmonella bacteremia 04/29/2016   Sepsis ([place]) 04/29/2016   Stroke ([place]) 11/18/2015   no deficits   TIA (transient ischemic attack)    affected left eye   Urgency of urination     Past Surgical History:  Procedure Laterality Date   AORTIC VALVE REPLACEMENT     BACK SURGERY  80s   CARDIAC VALVE REPLACEMENT  11/01/2008   21-mm Edwards Pericardial Magna - Ease valve   ENDARTERECTOMY Right 01/13/2016   Procedure: ENDARTERECTOMY CAROTID - RIGHT;  Surgeon: Conrad Bartley, MD;  Location: [facility];  Service: Vascular;  Laterality: Right;   EYE SURGERY     laser surgery, left eye  Left 10-29-2015    retinal surgery/ Deloria Lair MD    PERIPHERAL VASCULAR CATHETERIZATION Right 11/18/2015    Procedure: Carotid Angiography;  Surgeon: Conrad [place], MD;  Location: [facility] CV LAB;  Service: Cardiovascular;  Laterality: Right;   PERIPHERAL VASCULAR CATHETERIZATION N/A 11/18/2015   Procedure: Aortic Arch Angiography;  Surgeon: Conrad [place], MD;  Location: [facility] CV LAB;  Service: Cardiovascular;  Laterality: N/A;   TEE WITHOUT CARDIOVERSION N/A 05/05/2016   Procedure: TRANSESOPHAGEAL ECHOCARDIOGRAM (TEE);  Surgeon: Lelon Perla, MD;  Location: [facility] ENDOSCOPY;  Service: Cardiovascular;  Laterality: N/A;    Social History   Socioeconomic History   Marital status: Married    Spouse name: Not on file   Number of children: 2   Years of education: 11   Highest education level: 11th grade  Occupational History   Occupation: maintenance    Employer: [employer]    Comment: Retired  Tobacco Use   Smoking status: Former Smoker    Packs/day: 2.00    Years: 20.00    Pack years: 40.00    Types: Cigarettes    Quit date: 07/20/1986    Years since quitting: 33.8   Smokeless tobacco: Never Used  Vaping Use   Vaping Use: Never used  Substance and Sexual Activity   Alcohol use: No    Alcohol/week: 0.0 standard drinks   Drug use: No  Sexual activity: Yes  Other Topics Concern   Not on file  Social History Narrative   Not on file   Social Determinants of Health   Financial Resource Strain:    Difficulty of Paying Living Expenses: Not on file  Food Insecurity:    Worried About [place] in the Last Year: Not on file   Ran Out of Food in the Last Year: Not on file  Transportation Needs:    Lack of Transportation (Medical): Not on file   Lack of Transportation (Non-Medical): Not on file  Physical Activity:    Days of Exercise per Week: Not on file   Minutes of Exercise per Session: Not on file  Stress:    Feeling of Stress : Not on file  Social Connections:    Frequency of Communication with Friends and Family: Not on file   Frequency of  Social Gatherings with Friends and Family: Not on file   Attends Religious Services: Not on file   Active Member of Clubs or Organizations: Not on file   Attends Archivist Meetings: Not on file   Marital Status: Not on file  Intimate Partner Violence:    Fear of Current or Ex-Partner: Not on file   Emotionally Abused: Not on file   Physically Abused: Not on file   Sexually Abused: Not on file   Family History  Problem Relation Age of Onset   Heart disease Mother    Cancer Mother        breast   Stroke Father 66   Hypertension Father    Early death Sister        infant death   Heart disease Brother    Hydrocephalus Brother    Heart disease Brother    Diabetes Brother    Hotel manager   Healthy Son    Healthy Son       VITAL SIGNS BP (!) 146/41    Pulse (!) 58    Temp 98 F (36.7 C)    Resp 18    Ht 5' 5"  (1.651 m)    Wt 165 lb (74.8 kg)    SpO2 100%    BMI 27.46 kg/m   Outpatient Encounter Medications as of 05/20/2020  Medication Sig   acetaminophen (TYLENOL) 325 MG tablet Take 2 tablets (650 mg total) by mouth every 6 (six) hours as needed for mild pain (or Fever >/= 101).   Amino Acids-Protein Hydrolys (PRO-STAT 64 PO) Take 30 mLs by mouth 2 (two) times daily.   apixaban (ELIQUIS) 5 MG TABS tablet Take 1 tablet (5 mg total) by mouth 2 (two) times daily.   atorvastatin (LIPITOR) 40 MG tablet TAKE 1 TABLET BY MOUTH  DAILY   cyanocobalamin 1000 MCG tablet Take 1,000 mcg by mouth daily.   insulin aspart (NOVOLOG FLEXPEN) 100 UNIT/ML FlexPen Inject 6 Units into the skin daily. With supper if blood sugar is greater than 150   insulin degludec (TRESIBA FLEXTOUCH) 200 UNIT/ML FlexTouch Pen Inject 50 Units into the skin daily.   latanoprost (XALATAN) 0.005 % ophthalmic solution Place 1 drop into the left eye at bedtime.    metoprolol succinate (TOPROL-XL) 100 MG 24 hr tablet TAKE 1 TABLET BY MOUTH  DAILY WITH OR IMMEDIATELY   FOLLOWING A MEAL   UNABLE TO FIND Diet: NAS and consistent carbohydrate   vitamin C (ASCORBIC ACID) 500 MG tablet Take 500 mg by mouth daily.  vitamin E (VITAMIN E) 1000 UNIT capsule Take 1,000 Units by mouth daily.   No facility-administered encounter medications on file as of 05/20/2020.     SIGNIFICANT DIAGNOSTIC EXAMS  PREVIOUS   05-05-20: ct of head:  1. No acute intracranial abnormalities. 2. Chronic atrophy and small vessel ischemia.  05-06-20: chest x-ray:  Mild cardiac enlargement. Peribronchial thickening with streaky perihilar opacities possibly representing early viral pneumonia.  05-06-20: left hip x-ray:  There is no acute fracture or dislocation. The bones are osteopenic. Bilateral hip osteoarthritic changes. There is degenerative changes of the lower lumbar spine. The soft tissues are unremarkable  05-06-20: ct of left hip:  Hematoma in the left gluteal musculature as described above. Stranding in subcutaneous fat lateral to the left hip may be posttraumatic or could be due to cellulitis. Mild left hip osteoarthritis and chondrocalcinosis. No acute bony abnormality or joint effusion.  NO NEW EXAMS.   LABS REVIEWED PREVIOUS   12-25-19: chol 89; ldl 47; trig 60; hdl 30; hgb a1c 7.4 05-06-20: wbc 11.9; hgb 10.2; hct 30.9; mcv 85.8 plt 169; glucose 102; bun 66; creat 3.39; k+ 3.8; na++ 136; ca 8.5 liver normal albumin 3.3 mag 2.0; tsh 1.036; vit B 12: 87 folate 11.3 urine drug screen + opiates  blood culture: no growth 05-07-20: wbc 8.8; hgb 9.2; hct 28.3; mcv 86.3 plt 173; glucose 96; bun 55; creat 2.04 ;k+ 3.9; na++ 141; ca 8.3; liver normal albumin 2.8 05-09-20: wbc 8.6; hgb 8.7; hct 27.4; mcv 87.3 plt 173; glucose 147; bun 31; creat 1.44; k+ 3.8; na++ 140; ca 8.6; liver normal albumin 2.9  05-13-20: wbc 8.2; hgb 9.2; hct 29.0; mcv 88.7 plt 241; glucose 92; bun 41; creat 1.47; k+ 3.8; na++ 139; ca 8.8  NO NEW LABS.     Review of Systems  Constitutional:  Negative for malaise/fatigue.  Respiratory: Negative for cough and shortness of breath.   Cardiovascular: Negative for chest pain, palpitations and leg swelling.  Gastrointestinal: Negative for abdominal pain, constipation and heartburn.  Musculoskeletal: Negative for back pain, joint pain and myalgias.  Skin: Negative.   Neurological: Negative for dizziness.  Psychiatric/Behavioral: The patient is not nervous/anxious.      Physical Exam Constitutional:      General: He is not in acute distress.    Appearance: He is well-developed. He is not diaphoretic.  Eyes:     Comments: Left eye blindness   Neck:     Thyroid: No thyromegaly.  Cardiovascular:     Rate and Rhythm: Normal rate and regular rhythm.     Pulses: Normal pulses.     Heart sounds: Normal heart sounds.  Pulmonary:     Effort: Pulmonary effort is normal. No respiratory distress.     Breath sounds: Normal breath sounds.  Abdominal:     General: Bowel sounds are normal. There is no distension.     Palpations: Abdomen is soft.     Tenderness: There is no abdominal tenderness.  Musculoskeletal:        General: Normal range of motion.     Cervical back: Normal range of motion.     Right lower leg: No edema.     Left lower leg: No edema.  Lymphadenopathy:     Cervical: No cervical adenopathy.  Skin:    General: Skin is warm and dry.  Neurological:     Mental Status: He is alert and oriented to person, place, and time.  Psychiatric:  Mood and Affect: Mood normal.     ASSESSMENT/ PLAN:  Patient is being discharged with the following home health services:  Pt/ot to evaluate and treat as indicated for gait balance strength adl training.   Patient is being discharged with the following durable medical equipment:  None needed   Patient has been advised to f/u with their PCP in 1-2 weeks to bring them up to date on their rehab stay.  Social services at facility was responsible for arranging this appointment.  Pt  was provided with a 30 day supply of prescriptions for medications and refills must be obtained from their PCP.  For controlled substances, a more limited supply may be provided adequate until PCP appointment only.  [facility] 30 day supply of his [facility] [place]  Time spent with patient 35 minutes: medications home health dme.     Ok Edwards NP [facility]  Contact [phone] Monday through Friday 8am- 5pm  After hours call [phone]

## 2020-05-20 NOTE — Telephone Encounter (Signed)
Appointment scheduled.

## 2020-05-22 ENCOUNTER — Telehealth: Payer: Self-pay

## 2020-05-22 NOTE — Telephone Encounter (Signed)
°  Contact Date: 05/22/2020 Contacted By: Nigel Berthold  Transition Care Management Follow-up Telephone Call  Date of discharge and from where: 05/21/20- [facility]  How have you been since you were released from the hospital? States he is better and walking better.  Any questions or concerns? Yes- Patient was not given his Trulicity at the [facility]  Should he start back?   Items Reviewed:  Did the pt receive and understand the discharge instructions provided? Yes   Medications obtained and verified? Yes   Any new allergies since your discharge? No   Dietary orders reviewed? Yes  Do you have support at home? Yes   Discontinued Medications Trulicity was not given. New Medications Added B12 1068m  Current Medication List Allergies as of 05/22/2020      Reactions   Baclofen Other (See Comments)   Stroke like symptoms   Morphine And Related Other (See Comments)   Delirium       Medication List    Notice   This visit is during an admission. Changes to the med list made in this visit will be reflected in the After Visit Summary of the admission.      Home Care and Equipment/Supplies: Were home health services ordered? no  Were any new equipment or medical supplies ordered?  No What is the name of the medical supply agency? n/a  Were you able to get the supplies/equipment? n/a Do you have any questions related to the use of the equipment or supplies? No  Functional Questionnaire: (I = IndependentI D=Dependent  Bathing/Dressing- I  Meal Prep- D  Eating- I  Maintaining continence- I  Transferring/Ambulation- I  Managing Meds- D  Follow up appointments reviewed:   PCP Hospital f/u appt confirmed? Yes  Scheduled to see Dr. GDarnell Levelon 05/29/20 @ 2:15pm.  [facility]  Are transportation arrangements needed? No   If their condition worsens, is the pt aware to call PCP or go to the Emergency Dept.? Yes  Was  the patient provided with contact information for the PCP's office or ED? Yes  Was to pt encouraged to call back with questions or concerns? Yes

## 2020-05-25 DIAGNOSIS — E1122 Type 2 diabetes mellitus with diabetic chronic kidney disease: Secondary | ICD-10-CM | POA: Diagnosis not present

## 2020-05-25 DIAGNOSIS — D649 Anemia, unspecified: Secondary | ICD-10-CM | POA: Diagnosis not present

## 2020-05-25 DIAGNOSIS — I5032 Chronic diastolic (congestive) heart failure: Secondary | ICD-10-CM | POA: Diagnosis not present

## 2020-05-25 DIAGNOSIS — H53132 Sudden visual loss, left eye: Secondary | ICD-10-CM | POA: Diagnosis not present

## 2020-05-25 DIAGNOSIS — Z794 Long term (current) use of insulin: Secondary | ICD-10-CM | POA: Diagnosis not present

## 2020-05-25 DIAGNOSIS — N1832 Chronic kidney disease, stage 3b: Secondary | ICD-10-CM | POA: Diagnosis not present

## 2020-05-25 DIAGNOSIS — I130 Hypertensive heart and chronic kidney disease with heart failure and stage 1 through stage 4 chronic kidney disease, or unspecified chronic kidney disease: Secondary | ICD-10-CM | POA: Diagnosis not present

## 2020-05-25 DIAGNOSIS — G9341 Metabolic encephalopathy: Secondary | ICD-10-CM | POA: Diagnosis not present

## 2020-05-25 DIAGNOSIS — I69398 Other sequelae of cerebral infarction: Secondary | ICD-10-CM | POA: Diagnosis not present

## 2020-05-25 DIAGNOSIS — I480 Paroxysmal atrial fibrillation: Secondary | ICD-10-CM | POA: Diagnosis not present

## 2020-05-28 DIAGNOSIS — I480 Paroxysmal atrial fibrillation: Secondary | ICD-10-CM | POA: Diagnosis not present

## 2020-05-28 DIAGNOSIS — D649 Anemia, unspecified: Secondary | ICD-10-CM | POA: Diagnosis not present

## 2020-05-28 DIAGNOSIS — I69398 Other sequelae of cerebral infarction: Secondary | ICD-10-CM | POA: Diagnosis not present

## 2020-05-28 DIAGNOSIS — I130 Hypertensive heart and chronic kidney disease with heart failure and stage 1 through stage 4 chronic kidney disease, or unspecified chronic kidney disease: Secondary | ICD-10-CM | POA: Diagnosis not present

## 2020-05-28 DIAGNOSIS — N1832 Chronic kidney disease, stage 3b: Secondary | ICD-10-CM | POA: Diagnosis not present

## 2020-05-28 DIAGNOSIS — H53132 Sudden visual loss, left eye: Secondary | ICD-10-CM | POA: Diagnosis not present

## 2020-05-28 DIAGNOSIS — G9341 Metabolic encephalopathy: Secondary | ICD-10-CM | POA: Diagnosis not present

## 2020-05-28 DIAGNOSIS — E1122 Type 2 diabetes mellitus with diabetic chronic kidney disease: Secondary | ICD-10-CM | POA: Diagnosis not present

## 2020-05-28 DIAGNOSIS — I5032 Chronic diastolic (congestive) heart failure: Secondary | ICD-10-CM | POA: Diagnosis not present

## 2020-05-28 DIAGNOSIS — Z794 Long term (current) use of insulin: Secondary | ICD-10-CM | POA: Diagnosis not present

## 2020-05-29 ENCOUNTER — Encounter: Payer: Self-pay | Admitting: Family Medicine

## 2020-05-29 ENCOUNTER — Ambulatory Visit (INDEPENDENT_AMBULATORY_CARE_PROVIDER_SITE_OTHER): Payer: Medicare Other | Admitting: Family Medicine

## 2020-05-29 ENCOUNTER — Other Ambulatory Visit: Payer: Self-pay

## 2020-05-29 VITALS — BP 105/61 | HR 69 | Temp 98.6°F | Ht 65.0 in | Wt 163.2 lb

## 2020-05-29 DIAGNOSIS — S7002XD Contusion of left hip, subsequent encounter: Secondary | ICD-10-CM | POA: Diagnosis not present

## 2020-05-29 DIAGNOSIS — Z09 Encounter for follow-up examination after completed treatment for conditions other than malignant neoplasm: Secondary | ICD-10-CM

## 2020-05-29 DIAGNOSIS — E538 Deficiency of other specified B group vitamins: Secondary | ICD-10-CM

## 2020-05-29 DIAGNOSIS — Z7901 Long term (current) use of anticoagulants: Secondary | ICD-10-CM

## 2020-05-29 NOTE — Progress Notes (Signed)
Subjective: CC: Hospital discharge follow-up, nursing home follow-up PCP: [name], DO [id number] L Felmlee is a 79 y.o. male who is accompanied to office visit by his wife.  He is presenting to clinic today for:  1.  Hospital discharge follow-up, nursing home follow-up Patient was hospitalized on 05/05/2020 and discharged on 05/09/2020 to a nursing facility for rehabilitation.  He was admitted due to altered mental status, weakness and was found to be hypotensive.  He was treated for a leukocytosis with IV antibiotics though no infectious etiology was ever found.  He was noted to have a large left gluteal hematoma that was likely contributing to the hypotension.  He was continued on Eliquis given recurrent stroke history.  His hemoglobin was checked on the outpatient setting at the nursing facility and remained above 7 and therefore 5 mg twice daily of Eliquis was continued.  His Trulicity, lisinopril and hydrochlorothiazide were discontinued at the hospital due to AKI.  He was continued on his [place] and NovoLog.  Blood sugars have been running typically 80s in the morning but occasionally down into the 70s.  He has had a blood sugar in the 300s but this was after eating potato salad, cake and a bowl of crispies.  He has had no symptomology.  He has been acting his normal self.  He continues to have weakness and some gait instability.  He was given vitamin B12 injections in the hospital and has subsequently been continued on oral B12.  His wife does admit that he does not eat as much meat as he used to eat.  She wonders if this has been ongoing for a while given his change in mental status earlier in the summer.  Home health physical therapy is coming again tomorrow.  They are going to work on balance.   ROS: Per HPI  Allergies  Allergen Reactions  . Baclofen Other (See Comments)    Stroke like symptoms  . Morphine And Related Other (See Comments)    Delirium    Past Medical  History:  Diagnosis Date  . AKI (acute kidney injury) ([place])   . Allergy   . Anxiety   . Arthritis    Right shoulder  . Atrial fibrillation ([place])   . Carotid artery stenosis   . Cataract   . CHF (congestive heart failure) ([place])   . CVA (cerebral vascular accident) ([place]) 04/28/2016  . Decreased vision    left eye  . Diabetes mellitus without complication ([place])    Takes Metformin  . Hyperlipidemia   . Hypertension   . Salmonella bacteremia 04/29/2016  . Sepsis ([place]) 04/29/2016  . Stroke ([place]) 11/18/2015   no deficits  . TIA (transient ischemic attack)    affected left eye  . Urgency of urination     Current Outpatient Medications:  .  acetaminophen (TYLENOL) 325 MG tablet, Take 2 tablets (650 mg total) by mouth every 6 (six) hours as needed for mild pain (or Fever >/= 101)., Disp: , Rfl:  .  Amino Acids-Protein Hydrolys (PRO-STAT 64 PO), Take 30 mLs by mouth 2 (two) times daily., Disp: , Rfl:  .  apixaban (ELIQUIS) 5 MG TABS tablet, Take 1 tablet (5 mg total) by mouth 2 (two) times daily., Disp: 180 tablet, Rfl: 0 .  atorvastatin (LIPITOR) 40 MG tablet, TAKE 1 TABLET BY MOUTH  DAILY, Disp: 90 tablet, Rfl: 0 .  cyanocobalamin 1000 MCG tablet, Take 1,000 mcg by mouth daily., Disp: , Rfl:  .  insulin aspart (NOVOLOG FLEXPEN) 100 UNIT/ML FlexPen, Inject 6 Units into the skin daily. With supper if blood sugar is greater than 150, Disp: , Rfl:  .  insulin degludec (TRESIBA FLEXTOUCH) 200 UNIT/ML FlexTouch Pen, Inject 50 Units into the skin daily., Disp: 15 mL, Rfl: 5 .  latanoprost (XALATAN) 0.005 % ophthalmic solution, Place 1 drop into the left eye at bedtime. , Disp: , Rfl:  .  metoprolol succinate (TOPROL-XL) 100 MG 24 hr tablet, TAKE 1 TABLET BY MOUTH  DAILY WITH OR IMMEDIATELY  FOLLOWING A MEAL, Disp: 90 tablet, Rfl: 0 .  UNABLE TO FIND, Diet: NAS and consistent carbohydrate, Disp: , Rfl:  .  vitamin C (ASCORBIC ACID) 500 MG tablet, Take 500 mg by mouth daily., Disp: , Rfl:  .   vitamin E (VITAMIN E) 1000 UNIT capsule, Take 1,000 Units by mouth daily., Disp: , Rfl:  Social History   Socioeconomic History  . Marital status: Married    Spouse name: Not on file  . Number of children: 2  . Years of education: 4  . Highest education level: 11th grade  Occupational History  . Occupation: maintenance    Employer: [employer]    Comment: Retired  Tobacco Use  . Smoking status: Former Smoker    Packs/day: 2.00    Years: 20.00    Pack years: 40.00    Types: Cigarettes    Quit date: 07/20/1986    Years since quitting: 33.8  . Smokeless tobacco: Never Used  Vaping Use  . Vaping Use: Never used  Substance and Sexual Activity  . Alcohol use: No    Alcohol/week: 0.0 standard drinks  . Drug use: No  . Sexual activity: Yes  Other Topics Concern  . Not on file  Social History Narrative  . Not on file   Social Determinants of Health   Financial Resource Strain:   . Difficulty of Paying Living Expenses: Not on file  Food Insecurity:   . Worried About Charity fundraiser in the Last Year: Not on file  . Ran Out of Food in the Last Year: Not on file  Transportation Needs:   . Lack of Transportation (Medical): Not on file  . Lack of Transportation (Non-Medical): Not on file  Physical Activity:   . Days of Exercise per Week: Not on file  . Minutes of Exercise per Session: Not on file  Stress:   . Feeling of Stress : Not on file  Social Connections:   . Frequency of Communication with Friends and Family: Not on file  . Frequency of Social Gatherings with Friends and Family: Not on file  . Attends Religious Services: Not on file  . Active Member of Clubs or Organizations: Not on file  . Attends Archivist Meetings: Not on file  . Marital Status: Not on file  Intimate Partner Violence:   . Fear of Current or Ex-Partner: Not on file  . Emotionally Abused: Not on file  . Physically Abused: Not on file  . Sexually Abused: Not on file   Family History    Problem Relation Age of Onset  . Heart disease Mother   . Cancer Mother        breast  . Stroke Father 44  . Hypertension Father   . Early death Sister        infant death  . Heart disease Brother   . Hydrocephalus Brother   . Heart disease Brother   . Diabetes Brother   .  Cancer Brother        prostat  . Healthy Son   . Healthy Son     Objective: Office vital signs reviewed. BP 105/61   Pulse 69   Temp 98.6 F (37 C)   Ht 5' 5"  (1.651 m)   Wt 163 lb 3.2 oz (74 kg)   SpO2 98%   BMI 27.16 kg/m   Physical Examination:  General: Awake, alert, No acute distress HEENT: Moist mucous membranes.  Symmetric rise of palate.  Left pupil is dilated. Cardio: regular rate and rhythm, S1S2 heard, no murmurs appreciated Pulm: clear to auscultation bilaterally, no wheezes, rhonchi or rales; normal work of breathing on room air MSK: Gait somewhat unsteady.  He is utilizing a cane Skin: dry; he has a healing abrasion noted along the left anterior lower extremity.  He has a healing hematoma noted along the left flank. Neuro: Alert oriented x3.  Left dilated pupil (patient chronically blind in that  Eye)    Assessment/ Plan: 79 y.o. male   1. Hospital discharge follow-up I reviewed the hospital discharge summary, nursing home facility discharge summary.  BMP, CBC recollected today.  I am also checking his vitamin B12 level.  I will plan to recheck this level again in January when I see him.  We discussed the possibility of needing intramuscular B12 injections each month depending on how his levels look.  He is to continue work with physical therapy for strengthening exercises and balance training.  His blood pressure is borderline low today so I have not felt the need to reinitiate any of his blood pressure medications but will start on a calcium channel blocker if needed going forward as per hospital recommendations.  Additionally, given his blood sugars in the 70s and 80s each morning I do  not feel the need to reinitiate Trulicity at this time.  He has follow-up scheduled with endocrinology after the first of the year.  We will plan for A1c at that time.  I discussed monitoring blood pressures and blood sugars closely.  I offered CGM but patient declined this today. - Basic Metabolic Panel - CBC  2. Contusion of left hip, subsequent encounter Healing - Basic Metabolic Panel - CBC  3. Current use of long term anticoagulation Continue Eliquis 5 mg twice daily for now - Basic Metabolic Panel - CBC  4. Vitamin B12 deficiency Check B12 level - CBC - Vitamin B12   No orders of the defined types were placed in this encounter.  No orders of the defined types were placed in this encounter.   Today's visit is for Transitional Care Management.  The patient was discharged from [facility] on 05/20/2020 with a primary diagnosis of hematoma, metabolic encephalopathy, normocytic anemia due to blood loss, acute on chronic kidney injury.   Contact with the patient and/or caregiver, by a clinical staff member, was made on 05/22/2020 and was documented as a telephone encounter within the EMR.  Through chart review and discussion with the patient I have determined that management of their condition is of moderate complexity.    [name], DO [place] [phone]

## 2020-05-29 NOTE — Patient Instructions (Signed)
You had labs performed today.  You will be contacted with the results of the labs once they are available, usually in the next 3 business days for routine lab work.  If you have an active my chart account, they will be released to your MyChart.  If you prefer to have these labs released to you via telephone, please let us know.  If you had a pap smear or biopsy performed, expect to be contacted in about 7-10 days.   Vitamin B12 Deficiency Vitamin B12 deficiency means that your body does not have enough vitamin B12. The body needs this vitamin:  To make red blood cells.  To make genes (DNA).  To help the nerves work. If you do not have enough vitamin B12 in your body, you can have health problems. What are the causes?  Not eating enough foods that contain vitamin B12.  Not being able to absorb vitamin B12 from the food that you eat.  Certain digestive system diseases.  A condition in which the body does not make enough of a certain protein, which results in too few red blood cells (pernicious anemia).  Having a surgery in which part of the stomach or small intestine is removed.  Taking medicines that make it hard for the body to absorb vitamin B12. These medicines include: ? Heartburn medicines. ? Some antibiotic medicines. ? Other medicines that are used to treat certain conditions. What increases the risk?  Being older than age 37.  Eating a vegetarian or vegan diet, especially while you are pregnant.  Eating a poor diet while you are pregnant.  Taking certain medicines.  Having alcoholism. What are the signs or symptoms? In some cases, there are no symptoms. If the condition leads to too few blood cells or nerve damage, symptoms can occur, such as:  Feeling weak.  Feeling tired (fatigued).  Not being hungry.  Weight loss.  A loss of feeling (numbness) or tingling in your hands and feet.  Redness and burning of the tongue.  Being mixed up (confused) or having  memory problems.  Sadness (depression).  Problems with your senses. This can include color blindness, ringing in the ears, or loss of taste.  Watery poop (diarrhea) or trouble pooping (constipation).  Trouble walking. If anemia is very bad, symptoms can include:  Being short of breath.  Being dizzy.  Having a very fast heartbeat. How is this treated?  Changing the way you eat and drink, such as: ? Eating more foods that contain vitamin B12. ? Drinking little or no alcohol.  Getting vitamin B12 shots.  Taking vitamin B12 supplements. Your doctor will tell you the dose that is best for you. Follow these instructions at home: Eating and drinking   Eat lots of healthy foods that contain vitamin B12. These include: ? Meats and poultry, such as beef, pork, chicken, [place], and organ meats, such as liver. ? Seafood, such as clams, rainbow trout, salmon, tuna, and haddock. ? Eggs. ? Cereal and dairy products that have vitamin B12 added to them. Check the label. The items listed above may not be a complete list of what you can eat and drink. Contact a dietitian for more options. General instructions  Get any shots as told by your doctor.  Take supplements only as told by your doctor.  Do not drink alcohol if your doctor tells you not to. In some cases, you may only be asked to limit alcohol use.  Keep all follow-up visits as told by  your doctor. This is important. Contact a doctor if:  Your symptoms come back. Get help right away if:  You have trouble breathing.  You have a very fast heartbeat.  You have chest pain.  You get dizzy.  You pass out. Summary  Vitamin B12 deficiency means that your body is not getting enough vitamin B12.  In some cases, there are no symptoms of this condition.  Treatment may include making a change in the way you eat and drink, getting vitamin B12 shots, or taking supplements.  Eat lots of healthy foods that contain vitamin  B12. This information is not intended to replace advice given to you by your health care provider. Make sure you discuss any questions you have with your health care provider. Document Revised: 03/15/2018 Document Reviewed: 03/15/2018 Elsevier Patient Education  2020 [employer].

## 2020-05-30 DIAGNOSIS — Z794 Long term (current) use of insulin: Secondary | ICD-10-CM | POA: Diagnosis not present

## 2020-05-30 DIAGNOSIS — I69398 Other sequelae of cerebral infarction: Secondary | ICD-10-CM | POA: Diagnosis not present

## 2020-05-30 DIAGNOSIS — I480 Paroxysmal atrial fibrillation: Secondary | ICD-10-CM | POA: Diagnosis not present

## 2020-05-30 DIAGNOSIS — I5032 Chronic diastolic (congestive) heart failure: Secondary | ICD-10-CM | POA: Diagnosis not present

## 2020-05-30 DIAGNOSIS — E1122 Type 2 diabetes mellitus with diabetic chronic kidney disease: Secondary | ICD-10-CM | POA: Diagnosis not present

## 2020-05-30 DIAGNOSIS — G9341 Metabolic encephalopathy: Secondary | ICD-10-CM | POA: Diagnosis not present

## 2020-05-30 DIAGNOSIS — N1832 Chronic kidney disease, stage 3b: Secondary | ICD-10-CM | POA: Diagnosis not present

## 2020-05-30 DIAGNOSIS — H53132 Sudden visual loss, left eye: Secondary | ICD-10-CM | POA: Diagnosis not present

## 2020-05-30 DIAGNOSIS — D649 Anemia, unspecified: Secondary | ICD-10-CM | POA: Diagnosis not present

## 2020-05-30 DIAGNOSIS — I130 Hypertensive heart and chronic kidney disease with heart failure and stage 1 through stage 4 chronic kidney disease, or unspecified chronic kidney disease: Secondary | ICD-10-CM | POA: Diagnosis not present

## 2020-05-30 LAB — CBC
Hematocrit: 39.1 % (ref 37.5–51.0)
Hemoglobin: 12.2 g/dL — ABNORMAL LOW (ref 13.0–17.7)
MCH: 28 pg (ref 26.6–33.0)
MCHC: 31.2 g/dL — ABNORMAL LOW (ref 31.5–35.7)
MCV: 90 fL (ref 79–97)
Platelets: 188 10*3/uL (ref 150–450)
RBC: 4.36 x10E6/uL (ref 4.14–5.80)
RDW: 15 % (ref 11.6–15.4)
WBC: 7.8 10*3/uL (ref 3.4–10.8)

## 2020-05-30 LAB — BASIC METABOLIC PANEL
BUN/Creatinine Ratio: 14 (ref 10–24)
BUN: 23 mg/dL (ref 8–27)
CO2: 26 mmol/L (ref 20–29)
Calcium: 9.1 mg/dL (ref 8.6–10.2)
Chloride: 106 mmol/L (ref 96–106)
Creatinine, Ser: 1.68 mg/dL — ABNORMAL HIGH (ref 0.76–1.27)
GFR calc Af Amer: 45 mL/min/{1.73_m2} — ABNORMAL LOW (ref >59)
GFR calc non Af Amer: 39 mL/min/{1.73_m2} — ABNORMAL LOW (ref >59)
Glucose: 130 mg/dL — ABNORMAL HIGH (ref 65–99)
Potassium: 5 mmol/L (ref 3.5–5.2)
Sodium: 143 mmol/L (ref 134–144)

## 2020-05-30 LAB — VITAMIN B12: Vitamin B-12: 1189 pg/mL (ref 232–1245)

## 2020-06-03 ENCOUNTER — Ambulatory Visit: Payer: Medicare Other | Admitting: Adult Health

## 2020-06-03 NOTE — Progress Notes (Deleted)
[facility] [address] [place] [address] [phone]       HOSPITAL FOLLOW UP NOTE  [name] Date of Birth:  27-Jun-1941 Medical Record Number:  [id number]   Reason for Referral:  hospital stroke follow up    SUBJECTIVE:   CHIEF COMPLAINT:  No chief complaint on file.   HPI:   Today, 06/03/2020, Evan Stanley returns for stroke follow-up.  Stable from stroke standpoint but since prior visit, hospitalized from 10/17 - 10/21 for AMS, multiple falls and hypotension requiring SNF rehab.  He has since returned back home and has been working with [facility] therapies.  Denies new stroke/TIA symptoms.  Remains on Eliquis 5 mg twice daily without bleeding or bruising.  Remains on atorvastatin without myalgias.  Blood pressure today ***.  Glucose levels stable per patient.     History provided for reference purposes only Initial visit 01/31/2020 JM: Evan Stanley is being seen for hospital follow-up accompanied by his wife. Residual deficit of imbalance and mild LLE weakness but overall improving. He has since completed [facility]. Use of cane for long distance ambulation. Denies any recent falls. Continues on eliquis without bleeding or bruising. Remains on atorvastatin without myalgias. Blood pressure today 118/80. Glucose levels stable. He does report history of occasional abnormal movements of RUE as if he is unable to control lasting from 2-10 minutes - wife has noticed more so since his recent stroke. No pain, weakness, or numbness/tingling associates. No further concerns at this time.   Stroke admission 12/24/2019 Evan Stanley is a 79 y.o. male with history of right carotid dissection with small strokes affecting the right hemisphere without any residual deficits,listed atrial fibrillationin problem list but not on anticoagulation, chronically occluded left ICA, diabetes, hypertension, hyperlipidemia, blindness in the left eye,CAD status post CABG with AVR,  peripheral vascular disease who presented on 12/24/2019 with recurrent falls since 12/20/2019 with altered gait.  Stroke work-up revealed several right frontal lobe and right parietal lobe cortical/subcortical infarcts in setting of multiple chronic intracranial stenosis/occlusion, current infarcts felt to be embolic secondary to unclear source possibly A. fib.  Unclear prior history of A. fib with documented remote history.  Recommended initiating Eliquis for secondary stroke prevention.  No carotid disease post right CEA 2015.  HTN stable.  LDL 47 I recommend continuation of atorvastatin 40 mg daily.  Uncontrolled DM with A1c 7.4.  Other stroke risk factors include advanced age, former tobacco use, history of stroke in 2017, CAD s/p CABG, hx of CHF, known carotid occlusion, s/p AV replacement with bioprosthetic valve.  Evaluated by therapies and discharged to CIR for ongoing therapy needs.  Stroke:   Several R frontal lobe and R parietal lobe cortical / subcortical infarcts in setting of multiple chronic intracranial stenoses/ occlusion, current infarcts felt to be  embolic secondary to unclear source. Possible AF, formal dx unclear.   CT head No acute abnormality. Small vessel disease. Atrophy.   MRI  Several tiny cortical / subcortical infarcts posterior R frontal lobe and R parietal lobe. Multiple chronic lacunes B white matter. Small vessel disease.   MRA Head Known R V4, L ICA and L ICA siphon occlusion. Reconstitution paraclinoid L ICA. Sinus dz  MRA neck chronic L ICA, distal R V4 occlusions. R ECA origin severe stenosis. origin L ECA moderate to severe stenosis. Distal L subclavian moderate stenosis.   2D Echo normal ejection fraction.  LDL 47  HgbA1c 7.4  Lovenox 40 mg sq daily  for VTE prophylaxis  aspirin 81 mg daily prior to admission, now on aspirin 81 mg daily.  Recommend change to Eliquis  Therapy recommendations: HH PT/OT  Disposition: home     ROS:   14 system review of  systems performed and negative with exception of weakness, imbalance  PMH:  Past Medical History:  Diagnosis Date  . AKI (acute kidney injury) ([place])   . Allergy   . Anxiety   . Arthritis    Right shoulder  . Atrial fibrillation ([place])   . Carotid artery stenosis   . Cataract   . CHF (congestive heart failure) ([place])   . CVA (cerebral vascular accident) ([place]) 04/28/2016  . Decreased vision    left eye  . Diabetes mellitus without complication ([place])    Takes Metformin  . Hyperlipidemia   . Hypertension   . Salmonella bacteremia 04/29/2016  . Sepsis ([place]) 04/29/2016  . Stroke ([place]) 11/18/2015   no deficits  . TIA (transient ischemic attack)    affected left eye  . Urgency of urination     PSH:  Past Surgical History:  Procedure Laterality Date  . AORTIC VALVE REPLACEMENT    . BACK SURGERY  80s  . CARDIAC VALVE REPLACEMENT  11/01/2008   21-mm Edwards Pericardial Magna - Ease valve  . ENDARTERECTOMY Right 01/13/2016   Procedure: ENDARTERECTOMY CAROTID - RIGHT;  Surgeon: Conrad [place], MD;  Location: [facility];  Service: Vascular;  Laterality: Right;  . EYE SURGERY    . laser surgery, left eye  Left 10-29-2015    retinal surgery/ Deloria Lair MD   . PERIPHERAL VASCULAR CATHETERIZATION Right 11/18/2015   Procedure: Carotid Angiography;  Surgeon: Conrad [place], MD;  Location: [facility];  Service: Cardiovascular;  Laterality: Right;  . PERIPHERAL VASCULAR CATHETERIZATION N/A 11/18/2015   Procedure: Aortic Arch Angiography;  Surgeon: Conrad [place], MD;  Location: [place] [facility];  Service: Cardiovascular;  Laterality: N/A;  . TEE WITHOUT CARDIOVERSION N/A 05/05/2016   Procedure: TRANSESOPHAGEAL ECHOCARDIOGRAM (TEE);  Surgeon: Lelon Perla, MD;  Location: [facility] ENDOSCOPY;  Service: Cardiovascular;  Laterality: N/A;    Social History:  Social History   Socioeconomic History  . Marital status: Married    Spouse name: Not on file  . Number of children: 2  . Years of education:  67  . Highest education level: 11th grade  Occupational History  . Occupation: maintenance    Employer: [employer]    Comment: Retired  Tobacco Use  . Smoking status: Former Smoker    Packs/day: 2.00    Years: 20.00    Pack years: 40.00    Types: Cigarettes    Quit date: 07/20/1986    Years since quitting: 33.8  . Smokeless tobacco: Never Used  Vaping Use  . Vaping Use: Never used  Substance and Sexual Activity  . Alcohol use: No    Alcohol/week: 0.0 standard drinks  . Drug use: No  . Sexual activity: Yes  Other Topics Concern  . Not on file  Social History Narrative  . Not on file   Social Determinants of Health   Financial Resource Strain:   . Difficulty of Paying Living Expenses: Not on file  Food Insecurity:   . Worried About Charity fundraiser in the Last Year: Not on file  . Ran Out of Food in the Last Year: Not on file  Transportation Needs:   . Lack of Transportation (Medical): Not on file  . Lack  of Transportation (Non-Medical): Not on file  Physical Activity:   . Days of Exercise per Week: Not on file  . Minutes of Exercise per Session: Not on file  Stress:   . Feeling of Stress : Not on file  Social Connections:   . Frequency of Communication with Friends and Family: Not on file  . Frequency of Social Gatherings with Friends and Family: Not on file  . Attends Religious Services: Not on file  . Active Member of Clubs or Organizations: Not on file  . Attends Archivist Meetings: Not on file  . Marital Status: Not on file  Intimate Partner Violence:   . Fear of Current or Ex-Partner: Not on file  . Emotionally Abused: Not on file  . Physically Abused: Not on file  . Sexually Abused: Not on file    Family History:  Family History  Problem Relation Age of Onset  . Heart disease Mother   . Cancer Mother        breast  . Stroke Father 67  . Hypertension Father   . Early death Sister        infant death  . Heart disease Brother   .  Hydrocephalus Brother   . Heart disease Brother   . Diabetes Brother   . Cancer Brother        prostat  . Healthy Son   . Healthy Son     Medications:   Current Outpatient Medications on File Prior to Visit  Medication Sig Dispense Refill  . acetaminophen (TYLENOL) 325 MG tablet Take 2 tablets (650 mg total) by mouth every 6 (six) hours as needed for mild pain (or Fever >/= 101).    . Amino Acids-Protein Hydrolys (PRO-STAT 64 PO) Take 30 mLs by mouth 2 (two) times daily.    Marland Kitchen apixaban (ELIQUIS) 5 MG TABS tablet Take 1 tablet (5 mg total) by mouth 2 (two) times daily. 180 tablet 0  . atorvastatin (LIPITOR) 40 MG tablet TAKE 1 TABLET BY MOUTH  DAILY 90 tablet 0  . cyanocobalamin 1000 MCG tablet Take 1,000 mcg by mouth daily.    . insulin aspart (NOVOLOG FLEXPEN) 100 UNIT/ML FlexPen Inject 6 Units into the skin daily. With supper if blood sugar is greater than 150    . insulin degludec (TRESIBA FLEXTOUCH) 200 UNIT/ML FlexTouch Pen Inject 50 Units into the skin daily. 15 mL 5  . latanoprost (XALATAN) 0.005 % ophthalmic solution Place 1 drop into the left eye at bedtime.     . metoprolol succinate (TOPROL-XL) 100 MG 24 hr tablet TAKE 1 TABLET BY MOUTH  DAILY WITH OR IMMEDIATELY  FOLLOWING A MEAL 90 tablet 0  . UNABLE TO FIND Diet: NAS and consistent carbohydrate    . vitamin C (ASCORBIC ACID) 500 MG tablet Take 500 mg by mouth daily.    . vitamin E (VITAMIN E) 1000 UNIT capsule Take 1,000 Units by mouth daily.     No current facility-administered medications on file prior to visit.    Allergies:   Allergies  Allergen Reactions  . Baclofen Other (See Comments)    Stroke like symptoms  . Morphine And Related Other (See Comments)    Delirium       OBJECTIVE:  Physical Exam  There were no vitals filed for this visit. There is no height or weight on file to calculate BMI. No exam data present   General: well developed, well nourished, pleasant elderly Caucasian male, seated, in  no  evident distress Head: head normocephalic and atraumatic.   Neck: supple with no carotid or supraclavicular bruits Cardiovascular: regular rate and rhythm, no murmurs Musculoskeletal: no deformity Skin:  no rash/petichiae Vascular:  Normal pulses all extremities   Neurologic Exam Mental Status: Awake and fully alert. Fluent speech and language. Oriented to place and time. Recent and remote memory intact. Attention span, concentration and fund of knowledge appropriate. Mood and affect appropriate.  Cranial Nerves: Pupils equal, briskly reactive to light. Extraocular movements full without nystagmus. Visual fields full to confrontation. Hearing intact. Facial sensation intact. Face, tongue, palate moves normally and symmetrically.  Motor: Normal bulk and tone. Normal strength in all tested extremity muscles except slight LLE weakness. Sensory.: intact to touch , pinprick , position and vibratory sensation.  Coordination: Rapid alternating movements normal in all extremities. Finger-to-nose and heel-to-Stanley performed accurately bilaterally. No evidence of tremors or incoordination.  Gait and Station: Arises from chair without difficulty. Stance is normal. Gait demonstrates mild hemiplegic gait with mild imbalance and use of cane Reflexes: 1+ and symmetric. Toes downgoing.        ASSESSMENT/PLAn: Evan Stanley is a 79 y.o. year old male presented with 4-day history of altered gait and recurrent falls on 12/24/2019 with stroke work-up revealing several right frontal lobe and right parietal lobe cortical/subcortical infarcts in setting of multiple chronic intracranial stenosis/occlusion, current infarcts felt to be embolic secondary to unclear source with documented history of atrial fibrillation therefore Eliquis initiated. Vascular risk factors include chronically occluded left ICA, prior stroke history, HTN, HLD, DM, CAD s/p CABG with AVR, PVD, ?AF history, hx of CHF and diffuse intracranial  stenosis.  Hospitalized 10/17-10/20 from home for AMS and multiple falls found to be hypotensive related to blood loss anemia and encephalopathy in setting of unclear etiology possibly AKI and B12 deficiency contributing factor with therapies recommending discharge to SNF.     1. Right frontal and right parietal strokes:  a. residual deficit: mild LLE weakness and imbalance - improving. Declines further outpatient therapy at this time but will speak to PCP at next weeks follow up visit if he wishes to have more. Discussed use of cane for fall prevention b. Continue Eliquis (apixaban) daily  and atorvastatin  for secondary stroke prevention.  c. Discussed secondary stroke prevention measures and importance of close PCP follow-up for aggressive stroke risk factor management 2. Atrial fibrillation: Advised to follow-up with cardiology and if history of atrial fibrillation cannot be confirmed, recommended to proceed with cardiac monitoring per hospital d/c recommendations. Continue eliquis at this time.  3. HTN: BP goal<130/90. 4. HLD: LDL goal<70.  On atorvastatin 40 mg daily per PCP 5. DMII: A1c goal<7.0.  On insulin aspart and insulin degludec per PCP 6. Carotid stenosis: continue to follow with cardiology for surveillance monitoring  7. RUE abnormal movement: possible dystonia type movement per symptoms reported possibly from prior strokes. Does not interfere with daily activity and has not worsened. Would recommend monitoring for now but if becomes daily onset or interfere with activity, would recommend further evaluation  8. High risk sleep apnea: discussed evaluation for possible sleep apnea for secondary stroke prevention and cardiovascular risk factors. He declines interest in evaluation at this time but will speak further with cardiologist. He is aware of increased risk of untreated sleep apnea    Follow up in 4 months or call earlier if needed   I spent 45 minutes of face-to-face and  non-face-to-face time with patient and wife.  This included  previsit chart review, lab review, study review, order entry, electronic health record documentation, patient education regarding recent stroke, residual deficits, importance of managing stroke risk factors and answered all questions to patient satisfaction     Frann Rider, [facility]  [facility] [address] [place], [place] [address]  Phone [phone] Fax [phone] Note: This document was prepared with digital dictation and possible smart phrase technology. Any transcriptional errors that result from this process are unintentional.

## 2020-06-04 DIAGNOSIS — M79676 Pain in unspecified toe(s): Secondary | ICD-10-CM | POA: Diagnosis not present

## 2020-06-04 DIAGNOSIS — E1142 Type 2 diabetes mellitus with diabetic polyneuropathy: Secondary | ICD-10-CM | POA: Diagnosis not present

## 2020-06-04 DIAGNOSIS — B351 Tinea unguium: Secondary | ICD-10-CM | POA: Diagnosis not present

## 2020-06-04 DIAGNOSIS — L84 Corns and callosities: Secondary | ICD-10-CM | POA: Diagnosis not present

## 2020-06-05 ENCOUNTER — Ambulatory Visit (INDEPENDENT_AMBULATORY_CARE_PROVIDER_SITE_OTHER): Payer: Medicare Other

## 2020-06-05 ENCOUNTER — Other Ambulatory Visit: Payer: Self-pay

## 2020-06-05 DIAGNOSIS — M6281 Muscle weakness (generalized): Secondary | ICD-10-CM

## 2020-06-05 DIAGNOSIS — I130 Hypertensive heart and chronic kidney disease with heart failure and stage 1 through stage 4 chronic kidney disease, or unspecified chronic kidney disease: Secondary | ICD-10-CM

## 2020-06-05 DIAGNOSIS — I69398 Other sequelae of cerebral infarction: Secondary | ICD-10-CM | POA: Diagnosis not present

## 2020-06-05 DIAGNOSIS — G9341 Metabolic encephalopathy: Secondary | ICD-10-CM | POA: Diagnosis not present

## 2020-06-05 DIAGNOSIS — E1122 Type 2 diabetes mellitus with diabetic chronic kidney disease: Secondary | ICD-10-CM | POA: Diagnosis not present

## 2020-06-05 DIAGNOSIS — I480 Paroxysmal atrial fibrillation: Secondary | ICD-10-CM | POA: Diagnosis not present

## 2020-06-05 DIAGNOSIS — D649 Anemia, unspecified: Secondary | ICD-10-CM | POA: Diagnosis not present

## 2020-06-05 DIAGNOSIS — N1832 Chronic kidney disease, stage 3b: Secondary | ICD-10-CM | POA: Diagnosis not present

## 2020-06-05 DIAGNOSIS — I69393 Ataxia following cerebral infarction: Secondary | ICD-10-CM

## 2020-06-05 DIAGNOSIS — Z794 Long term (current) use of insulin: Secondary | ICD-10-CM

## 2020-06-05 DIAGNOSIS — I5032 Chronic diastolic (congestive) heart failure: Secondary | ICD-10-CM

## 2020-06-05 DIAGNOSIS — H53132 Sudden visual loss, left eye: Secondary | ICD-10-CM

## 2020-06-05 DIAGNOSIS — Z7901 Long term (current) use of anticoagulants: Secondary | ICD-10-CM

## 2020-06-10 ENCOUNTER — Other Ambulatory Visit: Payer: Self-pay

## 2020-06-10 ENCOUNTER — Ambulatory Visit: Payer: Medicare Other | Admitting: Interventional Cardiology

## 2020-06-10 ENCOUNTER — Encounter: Payer: Self-pay | Admitting: Interventional Cardiology

## 2020-06-10 VITALS — BP 140/70 | HR 55 | Ht 65.0 in | Wt 168.0 lb

## 2020-06-10 DIAGNOSIS — I2510 Atherosclerotic heart disease of native coronary artery without angina pectoris: Secondary | ICD-10-CM | POA: Diagnosis not present

## 2020-06-10 DIAGNOSIS — E782 Mixed hyperlipidemia: Secondary | ICD-10-CM | POA: Diagnosis not present

## 2020-06-10 DIAGNOSIS — Z7189 Other specified counseling: Secondary | ICD-10-CM

## 2020-06-10 DIAGNOSIS — E1122 Type 2 diabetes mellitus with diabetic chronic kidney disease: Secondary | ICD-10-CM | POA: Diagnosis not present

## 2020-06-10 DIAGNOSIS — I739 Peripheral vascular disease, unspecified: Secondary | ICD-10-CM

## 2020-06-10 DIAGNOSIS — Z8673 Personal history of transient ischemic attack (TIA), and cerebral infarction without residual deficits: Secondary | ICD-10-CM

## 2020-06-10 DIAGNOSIS — I480 Paroxysmal atrial fibrillation: Secondary | ICD-10-CM

## 2020-06-10 DIAGNOSIS — Z953 Presence of xenogenic heart valve: Secondary | ICD-10-CM

## 2020-06-10 DIAGNOSIS — N183 Type 2 diabetes mellitus with diabetic chronic kidney disease: Secondary | ICD-10-CM

## 2020-06-10 NOTE — Progress Notes (Signed)
Cardiology Office Note:    Date:  06/10/2020   ID:  Evan Stanley, DOB 1940-12-23, MRN [id number]  PCP:  [name], DO  Cardiologist:  Sinclair Grooms, MD   Referring MD: [name], DO   Chief Complaint  Patient presents with  . Coronary Artery Disease  . Cardiac Valve Problem  . Atrial Fibrillation    History of Present Illness:    Evan Stanley is a 79 y.o. male with a hx of bioprosthetic aortic valve 2010,hyperlipidemia,coronary artery disease with prior bypass grafting 2010, left ventricular systolic dysfunction, right cerebral CVA (2017), total occlusion left ICA, RCEA, and DM type II.  Overall, doing well.  Denies orthopnea and PND.  Some difficulty with ambulation related to residual imbalance from CVA.  CVA is felt to be multiple and an embolic pattern.  Prior history of brief paroxysmal atrial fibrillation.  This led to the initiation of Eliquis 5 mg twice daily and 2021.  No bleeding complications.  Recent hospitalization with confusion after developing a left gluteal hematoma.  He has had physical therapy/rehab and is improving.  Encephalopathy is improved.  He denies angina, no orthopnea PND, no lower extremity swelling, no nitroglycerin use.  He has had no prolonged palpitations.  Past Medical History:  Diagnosis Date  . AKI (acute kidney injury) ([place])   . Allergy   . Anxiety   . Arthritis    Right shoulder  . Atrial fibrillation ([place])   . Carotid artery stenosis   . Cataract   . CHF (congestive heart failure) ([place])   . CVA (cerebral vascular accident) ([place]) 04/28/2016  . Decreased vision    left eye  . Diabetes mellitus without complication ([place])    Takes Metformin  . Hyperlipidemia   . Hypertension   . Salmonella bacteremia 04/29/2016  . Sepsis ([place]) 04/29/2016  . Stroke ([place]) 11/18/2015   no deficits  . TIA (transient ischemic attack)    affected left eye  . Urgency of urination     Past Surgical History:  Procedure  Laterality Date  . AORTIC VALVE REPLACEMENT    . BACK SURGERY  80s  . CARDIAC VALVE REPLACEMENT  11/01/2008   21-mm Edwards Pericardial Magna - Ease valve  . ENDARTERECTOMY Right 01/13/2016   Procedure: ENDARTERECTOMY CAROTID - RIGHT;  Surgeon: Conrad Torrance, MD;  Location: [facility];  Service: Vascular;  Laterality: Right;  . EYE SURGERY    . laser surgery, left eye  Left 10-29-2015    retinal surgery/ Deloria Lair MD   . PERIPHERAL VASCULAR CATHETERIZATION Right 11/18/2015   Procedure: Carotid Angiography;  Surgeon: Conrad [place], MD;  Location: [facility];  Service: Cardiovascular;  Laterality: Right;  . PERIPHERAL VASCULAR CATHETERIZATION N/A 11/18/2015   Procedure: Aortic Arch Angiography;  Surgeon: Conrad [place], MD;  Location: [facility];  Service: Cardiovascular;  Laterality: N/A;  . TEE WITHOUT CARDIOVERSION N/A 05/05/2016   Procedure: TRANSESOPHAGEAL ECHOCARDIOGRAM (TEE);  Surgeon: Lelon Perla, MD;  Location: [facility];  Service: Cardiovascular;  Laterality: N/A;    Current Medications: Current Meds  Medication Sig  . acetaminophen (TYLENOL) 325 MG tablet Take 2 tablets (650 mg total) by mouth every 6 (six) hours as needed for mild pain (or Fever >/= 101).  . Amino Acids-Protein Hydrolys (PRO-STAT 64 PO) Take 30 mLs by mouth 2 (two) times daily.  Marland Kitchen apixaban (ELIQUIS) 5 MG TABS tablet Take 1 tablet (5 mg total) by mouth 2 (  two) times daily.  Marland Kitchen atorvastatin (LIPITOR) 40 MG tablet TAKE 1 TABLET BY MOUTH  DAILY  . cyanocobalamin 1000 MCG tablet Take 1,000 mcg by mouth daily.  . hydrochlorothiazide (HYDRODIURIL) 25 MG tablet Take 25 mg by mouth daily.  . insulin aspart (NOVOLOG FLEXPEN) 100 UNIT/ML FlexPen Inject 6 Units into the skin daily. With supper if blood sugar is greater than 150  . insulin degludec (TRESIBA FLEXTOUCH) 200 UNIT/ML FlexTouch Pen Inject 50 Units into the skin daily.  Marland Kitchen latanoprost (XALATAN) 0.005 % ophthalmic solution Place 1 drop into the left eye  at bedtime.   Marland Kitchen lisinopril (ZESTRIL) 20 MG tablet Take 20 mg by mouth daily.  . metoprolol succinate (TOPROL-XL) 100 MG 24 hr tablet TAKE 1 TABLET BY MOUTH  DAILY WITH OR IMMEDIATELY  FOLLOWING A MEAL  . ONETOUCH ULTRA test strip   . UNABLE TO FIND Diet: NAS and consistent carbohydrate  . vitamin C (ASCORBIC ACID) 500 MG tablet Take 500 mg by mouth daily.  . vitamin E (VITAMIN E) 1000 UNIT capsule Take 1,000 Units by mouth daily.     Allergies:   Baclofen and Morphine and related   Social History   Socioeconomic History  . Marital status: Married    Spouse name: Not on file  . Number of children: 2  . Years of education: 76  . Highest education level: 11th grade  Occupational History  . Occupation: maintenance    Employer: [employer]    Comment: Retired  Tobacco Use  . Smoking status: Former Smoker    Packs/day: 2.00    Years: 20.00    Pack years: 40.00    Types: Cigarettes    Quit date: 07/20/1986    Years since quitting: 33.9  . Smokeless tobacco: Never Used  Vaping Use  . Vaping Use: Never used  Substance and Sexual Activity  . Alcohol use: No    Alcohol/week: 0.0 standard drinks  . Drug use: No  . Sexual activity: Yes  Other Topics Concern  . Not on file  Social History Narrative  . Not on file   Social Determinants of Health   Financial Resource Strain:   . Difficulty of Paying Living Expenses: Not on file  Food Insecurity:   . Worried About Charity fundraiser in the Last Year: Not on file  . Ran Out of Food in the Last Year: Not on file  Transportation Needs:   . Lack of Transportation (Medical): Not on file  . Lack of Transportation (Non-Medical): Not on file  Physical Activity:   . Days of Exercise per Week: Not on file  . Minutes of Exercise per Session: Not on file  Stress:   . Feeling of Stress : Not on file  Social Connections:   . Frequency of Communication with Friends and Family: Not on file  . Frequency of Social Gatherings with Friends and  Family: Not on file  . Attends Religious Services: Not on file  . Active Member of Clubs or Organizations: Not on file  . Attends Archivist Meetings: Not on file  . Marital Status: Not on file     Family History: The patient's family history includes Cancer in his brother and mother; Diabetes in his brother; Early death in his sister; Healthy in his son and son; Heart disease in his brother, brother, and mother; Hydrocephalus in his brother; Hypertension in his father; Stroke (age of onset: 56) in his father.  ROS:   Please see the  history of present illness.    Difficulty sleeping, difficulty with balance since his CVA.  Has early morning coughing with white phlegm production.  This is gone on for years.  Was previously a heavy smoker.  All other systems reviewed and are negative.  EKGs/Labs/Other Studies Reviewed:    The following studies were reviewed today: 2D Doppler echocardiogram 12/25/2019: IMPRESSIONS    1. Left ventricular ejection fraction, by estimation, is 65 to 70%. The  left ventricle has normal function. The left ventricle has no regional  wall motion abnormalities. There is moderate left ventricular hypertrophy.  Left ventricular diastolic  parameters are consistent with Grade I diastolic dysfunction (impaired  relaxation). Elevated left ventricular end-diastolic pressure.  2. Right ventricular systolic function is low normal. The right  ventricular size is normal.  3. The mitral valve is abnormal. Trivial mitral valve regurgitation.  4. The aortic valve has been repaired/replaced with a bioprosthetic  valve. Aortic valve regurgitation is not visualized. Aortic valve area, by  VTI measures 2.69 cm. Aortic valve mean gradient measures 8.0 mmHg. Peak  gradient is 13.5 mmHg. Aortic valve  Vmax measures 1.84 m/s.   Comparison(s): 05/15/2019: LVEF 60-65%, aortic valve mean gradient 13  mmHg, peak gradient 24 mmHg.   EKG:  EKG performed May 06, 2020  demonstrates normal sinus rhythm with scooping nonspecific ST abnormality.  When compared to August 2021, no significant changes noted.  Recent Labs: 05/06/2020: Magnesium 2.0; TSH 1.036 05/09/2020: ALT 24 05/29/2020: BUN 23; Creatinine, Ser 1.68; Hemoglobin 12.2; Platelets 188; Potassium 5.0; Sodium 143  Recent Lipid Panel    Component Value Date/Time   CHOL 89 12/25/2019 0257   CHOL 100 08/23/2017 0850   TRIG 60 12/25/2019 0257   HDL 30 (L) 12/25/2019 0257   HDL 32 (L) 08/23/2017 0850   CHOLHDL 3.0 12/25/2019 0257   VLDL 12 12/25/2019 0257   LDLCALC 47 12/25/2019 0257   LDLCALC 49 08/23/2017 0850   LDLDIRECT 62 02/16/2017 1151    Physical Exam:    VS:  BP 140/70   Pulse (!) 55   Ht 5' 5"  (1.651 m)   Wt 168 lb (76.2 kg)   SpO2 98%   BMI 27.96 kg/m     Wt Readings from Last 3 Encounters:  06/10/20 168 lb (76.2 kg)  05/29/20 163 lb 3.2 oz (74 kg)  05/20/20 165 lb (74.8 kg)     GEN: Consistent with age.  Gait is guarded and abnormal.  He uses a cane.. No acute distress HEENT: Normal NECK: No JVD. LYMPHATICS: No lymphadenopathy CARDIAC:  RRR with 1/6 to 2/6 systolic murmur, gallop, or edema. VASCULAR:  Normal Pulses. No bruits. RESPIRATORY:  Clear to auscultation without rales, wheezing or rhonchi  ABDOMEN: Soft, non-tender, non-distended, No pulsatile mass, MUSCULOSKELETAL: No deformity  SKIN: Warm and dry NEUROLOGIC:  Alert and oriented x 3 PSYCHIATRIC:  Normal affect   ASSESSMENT:    1. Coronary artery disease involving native coronary artery of native heart without angina pectoris   2. S/P aortic valve replacement with tissue   3. PAD (peripheral artery disease) ([place])   4. CKD stage 3 secondary to diabetes ([place])   5. Mixed hyperlipidemia   6. PAF (paroxysmal atrial fibrillation) ([place])   7. History of stroke in prior 3 months   8. Educated about COVID-19 virus infection    PLAN:    In order of problems listed above:  1. Secondary prevention reviewed  and discussed.  I encouraged physical activity but in  a safer manner as possible given imbalance from recent stroke. 2. Aortic valve prosthesis is functioning normally.  There is no significant gradient across the valve. 3. No claudication 4. The most recent creatinine was 1.7 in November 2021 which is consistent with CKD stage III. 5. Continue Lipitor 40 mg/day. 6. Paroxysmal atrial fibrillation with embolic CVA led to chronic anticoagulation in June 2021. 7. Continue Eliquis 5 mg twice daily.  Continue to monitor kidney function. 8. Vaccinated and practicing social distancing.  Overall education and awareness concerning secondary risk prevention was discussed in detail: LDL less than 70, hemoglobin A1c less than 7, blood pressure target less than 130/80 mmHg, >150 minutes of moderate aerobic activity per week, avoidance of smoking, weight control (via diet and exercise), and continued surveillance/management of/for obstructive sleep apnea.   Medication Adjustments/Labs and Tests Ordered: Current medicines are reviewed at length with the patient today.  Concerns regarding medicines are outlined above.  No orders of the defined types were placed in this encounter.  No orders of the defined types were placed in this encounter.   Patient Instructions  Medication Instructions:  Your physician recommends that you continue on your current medications as directed. Please refer to the Current Medication list given to you today.  *If you need a refill on your cardiac medications before your next appointment, please call your pharmacy*   Lab Work: None If you have labs (blood work) drawn today and your tests are completely normal, you will receive your results only by: Marland Kitchen MyChart Message (if you have MyChart) OR . A paper copy in the mail If you have any lab test that is abnormal or we need to change your treatment, we will call you to review the  results.   Testing/Procedures: None   Follow-Up: At [facility], you and your health needs are our priority.  As part of our continuing mission to provide you with exceptional heart care, we have created designated Provider Care Teams.  These Care Teams include your primary Cardiologist (physician) and Advanced Practice Providers (APPs -  Physician Assistants and Nurse Practitioners) who all work together to provide you with the care you need, when you need it.  We recommend signing up for the patient portal called "MyChart".  Sign up information is provided on this After Visit Summary.  MyChart is used to connect with patients for Virtual Visits (Telemedicine).  Patients are able to view lab/test results, encounter notes, upcoming appointments, etc.  Non-urgent messages can be sent to your provider as well.   To learn more about what you can do with MyChart, go to [email or web address].    Your next appointment:   6-8 month(s)  The format for your next appointment:   In Person  Provider:   You may see Sinclair Grooms, MD or one of the following Advanced Practice Providers on your designated Care Team:    Truitt Merle, NP  Cecilie Kicks, NP  Kathyrn Drown, NP    Other Instructions      Signed, Sinclair Grooms, MD  06/10/2020 11:40 AM    [place]

## 2020-06-10 NOTE — Patient Instructions (Signed)
Medication Instructions:  Your physician recommends that you continue on your current medications as directed. Please refer to the Current Medication list given to you today.  *If you need a refill on your cardiac medications before your next appointment, please call your pharmacy*   Lab Work: None If you have labs (blood work) drawn today and your tests are completely normal, you will receive your results only by: Marland Kitchen MyChart Message (if you have MyChart) OR . A paper copy in the mail If you have any lab test that is abnormal or we need to change your treatment, we will call you to review the results.   Testing/Procedures: None   Follow-Up: At [facility], you and your health needs are our priority.  As part of our continuing mission to provide you with exceptional heart care, we have created designated Provider Care Teams.  These Care Teams include your primary Cardiologist (physician) and Advanced Practice Providers (APPs -  Physician Assistants and Nurse Practitioners) who all work together to provide you with the care you need, when you need it.  We recommend signing up for the patient portal called "MyChart".  Sign up information is provided on this After Visit Summary.  MyChart is used to connect with patients for Virtual Visits (Telemedicine).  Patients are able to view lab/test results, encounter notes, upcoming appointments, etc.  Non-urgent messages can be sent to your provider as well.   To learn more about what you can do with MyChart, go to [email or web address].    Your next appointment:   6-8 month(s)  The format for your next appointment:   In Person  Provider:   You may see Sinclair Grooms, MD or one of the following Advanced Practice Providers on your designated Care Team:    Truitt Merle, NP  Cecilie Kicks, NP  Kathyrn Drown, NP    Other Instructions

## 2020-06-11 DIAGNOSIS — I130 Hypertensive heart and chronic kidney disease with heart failure and stage 1 through stage 4 chronic kidney disease, or unspecified chronic kidney disease: Secondary | ICD-10-CM | POA: Diagnosis not present

## 2020-06-11 DIAGNOSIS — I69398 Other sequelae of cerebral infarction: Secondary | ICD-10-CM | POA: Diagnosis not present

## 2020-06-11 DIAGNOSIS — E1122 Type 2 diabetes mellitus with diabetic chronic kidney disease: Secondary | ICD-10-CM | POA: Diagnosis not present

## 2020-06-11 DIAGNOSIS — N1832 Chronic kidney disease, stage 3b: Secondary | ICD-10-CM | POA: Diagnosis not present

## 2020-06-11 DIAGNOSIS — I5032 Chronic diastolic (congestive) heart failure: Secondary | ICD-10-CM | POA: Diagnosis not present

## 2020-06-11 DIAGNOSIS — I480 Paroxysmal atrial fibrillation: Secondary | ICD-10-CM | POA: Diagnosis not present

## 2020-06-11 DIAGNOSIS — Z794 Long term (current) use of insulin: Secondary | ICD-10-CM | POA: Diagnosis not present

## 2020-06-11 DIAGNOSIS — D649 Anemia, unspecified: Secondary | ICD-10-CM | POA: Diagnosis not present

## 2020-06-11 DIAGNOSIS — G9341 Metabolic encephalopathy: Secondary | ICD-10-CM | POA: Diagnosis not present

## 2020-06-11 DIAGNOSIS — H53132 Sudden visual loss, left eye: Secondary | ICD-10-CM | POA: Diagnosis not present

## 2020-06-16 ENCOUNTER — Other Ambulatory Visit: Payer: Self-pay | Admitting: Family Medicine

## 2020-06-19 DIAGNOSIS — E1122 Type 2 diabetes mellitus with diabetic chronic kidney disease: Secondary | ICD-10-CM | POA: Diagnosis not present

## 2020-06-19 DIAGNOSIS — I5032 Chronic diastolic (congestive) heart failure: Secondary | ICD-10-CM | POA: Diagnosis not present

## 2020-06-19 DIAGNOSIS — N1832 Chronic kidney disease, stage 3b: Secondary | ICD-10-CM | POA: Diagnosis not present

## 2020-06-19 DIAGNOSIS — I130 Hypertensive heart and chronic kidney disease with heart failure and stage 1 through stage 4 chronic kidney disease, or unspecified chronic kidney disease: Secondary | ICD-10-CM | POA: Diagnosis not present

## 2020-06-19 DIAGNOSIS — Z794 Long term (current) use of insulin: Secondary | ICD-10-CM | POA: Diagnosis not present

## 2020-06-19 DIAGNOSIS — H53132 Sudden visual loss, left eye: Secondary | ICD-10-CM | POA: Diagnosis not present

## 2020-06-19 DIAGNOSIS — D649 Anemia, unspecified: Secondary | ICD-10-CM | POA: Diagnosis not present

## 2020-06-19 DIAGNOSIS — I69398 Other sequelae of cerebral infarction: Secondary | ICD-10-CM | POA: Diagnosis not present

## 2020-06-19 DIAGNOSIS — G9341 Metabolic encephalopathy: Secondary | ICD-10-CM | POA: Diagnosis not present

## 2020-06-19 DIAGNOSIS — I480 Paroxysmal atrial fibrillation: Secondary | ICD-10-CM | POA: Diagnosis not present

## 2020-06-24 DIAGNOSIS — I130 Hypertensive heart and chronic kidney disease with heart failure and stage 1 through stage 4 chronic kidney disease, or unspecified chronic kidney disease: Secondary | ICD-10-CM | POA: Diagnosis not present

## 2020-06-24 DIAGNOSIS — I480 Paroxysmal atrial fibrillation: Secondary | ICD-10-CM | POA: Diagnosis not present

## 2020-06-24 DIAGNOSIS — G9341 Metabolic encephalopathy: Secondary | ICD-10-CM | POA: Diagnosis not present

## 2020-06-24 DIAGNOSIS — D649 Anemia, unspecified: Secondary | ICD-10-CM | POA: Diagnosis not present

## 2020-06-24 DIAGNOSIS — Z794 Long term (current) use of insulin: Secondary | ICD-10-CM | POA: Diagnosis not present

## 2020-06-24 DIAGNOSIS — I69398 Other sequelae of cerebral infarction: Secondary | ICD-10-CM | POA: Diagnosis not present

## 2020-06-24 DIAGNOSIS — E1122 Type 2 diabetes mellitus with diabetic chronic kidney disease: Secondary | ICD-10-CM | POA: Diagnosis not present

## 2020-06-24 DIAGNOSIS — I5032 Chronic diastolic (congestive) heart failure: Secondary | ICD-10-CM | POA: Diagnosis not present

## 2020-06-24 DIAGNOSIS — N1832 Chronic kidney disease, stage 3b: Secondary | ICD-10-CM | POA: Diagnosis not present

## 2020-06-24 DIAGNOSIS — H53132 Sudden visual loss, left eye: Secondary | ICD-10-CM | POA: Diagnosis not present

## 2020-07-02 ENCOUNTER — Ambulatory Visit (INDEPENDENT_AMBULATORY_CARE_PROVIDER_SITE_OTHER): Payer: Medicare Other

## 2020-07-02 DIAGNOSIS — Z0000 Encounter for general adult medical examination without abnormal findings: Secondary | ICD-10-CM | POA: Diagnosis not present

## 2020-07-02 NOTE — Progress Notes (Signed)
MEDICARE ANNUAL WELLNESS VISIT  07/02/2020  Telephone Visit Disclaimer This Medicare AWV was conducted by telephone due to national recommendations for restrictions regarding the COVID-19 Pandemic (e.g. social distancing).  I verified, using two identifiers, that I am speaking with Evan Stanley or their authorized healthcare agent. I discussed the limitations, risks, security, and privacy concerns of performing an evaluation and management service by telephone and the potential availability of an in-person appointment in the future. The patient expressed understanding and agreed to proceed.  Location of Patient: Home Location of Provider (nurse):  [facility]  Subjective:    Evan Stanley is a 79 y.o. male patient of [name], DO who had a Medicare Annual Wellness Visit today via telephone. Evan Stanley is Retired and lives with their spouse. He has three children and seven grandchildren. He reports that he is socially active and does interact with friends/family regularly. He is minimally physically active and enjoys playing golf.  Patient Care Team: [name], DO as PCP - General (Family Medicine) Belva Crome, MD as PCP - Cardiology (Cardiology) Conrad Hartford, MD as Consulting Physician (Vascular Surgery) Zadie Rhine Clent Demark, MD as Consulting Physician (Ophthalmology) Clent Jacks, MD as Consulting Physician (Ophthalmology) [name], MD as Consulting Physician (Endocrinology) Lavera Guise, [facility] (Pharmacist)  Advanced Directives 07/02/2020 05/20/2020 05/17/2020 05/14/2020 05/10/2020 05/06/2020 05/01/2020  Does Patient Have a Medical Advance Directive? No Yes No Yes No No No  Does patient want to make changes to medical advance directive? - No - Patient declined No - Patient declined No - Patient declined - - -  Would patient like information on creating a medical advance directive? No - Patient declined - - - No - Patient declined No - Patient declined -     Hospital Utilization Over the Past 12 Months: # of hospitalizations or ER visits: 1 # of surgeries: 0  Review of Systems    Patient reports that his overall health is better compared to last year.  History obtained from chart review and the patient  Patient Reported Readings (BP, Pulse, CBG, Weight, etc) none  Pain Assessment Pain : No/denies pain     Current Medications & Allergies (verified) Allergies as of 07/02/2020      Reactions   Baclofen Other (See Comments)   Stroke like symptoms   Morphine And Related Other (See Comments)   Delirium       Medication List       Accurate as of July 02, 2020  1:33 PM. If you have any questions, ask your nurse or doctor.        acetaminophen 325 MG tablet Commonly known as: TYLENOL Take 2 tablets (650 mg total) by mouth every 6 (six) hours as needed for mild pain (or Fever >/= 101).   atorvastatin 40 MG tablet Commonly known as: LIPITOR TAKE 1 TABLET BY MOUTH  DAILY   cyanocobalamin 1000 MCG tablet Take 1,000 mcg by mouth daily.   Eliquis 5 MG Tabs tablet Generic drug: apixaban TAKE 1 TABLET BY MOUTH  TWICE DAILY   hydrochlorothiazide 25 MG tablet Commonly known as: HYDRODIURIL Take 25 mg by mouth daily.   latanoprost 0.005 % ophthalmic solution Commonly known as: XALATAN Place 1 drop into the left eye at bedtime.   lisinopril 20 MG tablet Commonly known as: ZESTRIL Take 20 mg by mouth daily.   metoprolol succinate 100 MG 24 hr tablet Commonly known as: TOPROL-XL TAKE 1 TABLET BY MOUTH  DAILY  WITH OR IMMEDIATELY  FOLLOWING A MEAL   NovoLOG FlexPen 100 UNIT/ML FlexPen Generic drug: insulin aspart Inject 6 Units into the skin daily. With supper if blood sugar is greater than 150   OneTouch Ultra test strip Generic drug: glucose blood   PRO-STAT 64 PO Take 30 mLs by mouth 2 (two) times daily.   [name] FlexTouch 200 UNIT/ML FlexTouch Pen Generic drug: insulin degludec Inject 50 Units into the  skin daily.   UNABLE TO FIND Diet: NAS and consistent carbohydrate   vitamin C 500 MG tablet Commonly known as: ASCORBIC ACID Take 500 mg by mouth daily.   vitamin E 1000 UNIT capsule Take 1,000 Units by mouth daily.       History (reviewed): Past Medical History:  Diagnosis Date  . AKI (acute kidney injury) ([place])   . Allergy   . Anxiety   . Arthritis    Right shoulder  . Atrial fibrillation ([place])   . Carotid artery stenosis   . Cataract   . CHF (congestive heart failure) ([place])   . CVA (cerebral vascular accident) ([place]) 04/28/2016  . Decreased vision    left eye  . Diabetes mellitus without complication ([place])    Takes Metformin  . Hyperlipidemia   . Hypertension   . Salmonella bacteremia 04/29/2016  . Sepsis ([place]) 04/29/2016  . Stroke ([place]) 11/18/2015   no deficits  . TIA (transient ischemic attack)    affected left eye  . Urgency of urination    Past Surgical History:  Procedure Laterality Date  . AORTIC VALVE REPLACEMENT    . BACK SURGERY  80s  . CARDIAC VALVE REPLACEMENT  11/01/2008   21-mm Edwards Pericardial Magna - Ease valve  . ENDARTERECTOMY Right 01/13/2016   Procedure: ENDARTERECTOMY CAROTID - RIGHT;  Surgeon: Conrad Woodward, MD;  Location: [facility];  Service: Vascular;  Laterality: Right;  . EYE SURGERY    . laser surgery, left eye  Left 10-29-2015    retinal surgery/ Deloria Lair MD   . PERIPHERAL VASCULAR CATHETERIZATION Right 11/18/2015   Procedure: Carotid Angiography;  Surgeon: Conrad Marietta, MD;  Location: [facility];  Service: Cardiovascular;  Laterality: Right;  . PERIPHERAL VASCULAR CATHETERIZATION N/A 11/18/2015   Procedure: Aortic Arch Angiography;  Surgeon: Conrad Rankin, MD;  Location: [facility];  Service: Cardiovascular;  Laterality: N/A;  . TEE WITHOUT CARDIOVERSION N/A 05/05/2016   Procedure: TRANSESOPHAGEAL ECHOCARDIOGRAM (TEE);  Surgeon: Lelon Perla, MD;  Location: [facility] ENDOSCOPY;  Service: Cardiovascular;  Laterality: N/A;    Family History  Problem Relation Age of Onset  . Heart disease Mother   . Cancer Mother        breast  . Stroke Father 55  . Hypertension Father   . Early death Sister        infant death  . Heart disease Brother   . Hydrocephalus Brother   . Heart disease Brother   . Diabetes Brother   . Cancer Brother        prostat  . Healthy Son   . Healthy Son    Social History   Socioeconomic History  . Marital status: Married    Spouse name: Not on file  . Number of children: 2  . Years of education: 1  . Highest education level: 11th grade  Occupational History  . Occupation: maintenance    Employer: [employer]    Comment: Retired  Tobacco Use  . Smoking status: Former Smoker  Packs/day: 2.00    Years: 20.00    Pack years: 40.00    Types: Cigarettes    Quit date: 07/20/1986    Years since quitting: 33.9  . Smokeless tobacco: Never Used  Vaping Use  . Vaping Use: Never used  Substance and Sexual Activity  . Alcohol use: No    Alcohol/week: 0.0 standard drinks  . Drug use: No  . Sexual activity: Yes  Other Topics Concern  . Not on file  Social History Narrative  . Not on file   Social Determinants of Health   Financial Resource Strain: Not on file  Food Insecurity: Not on file  Transportation Needs: Not on file  Physical Activity: Not on file  Stress: Not on file  Social Connections: Not on file    Activities of Daily Living In your present state of health, do you have any difficulty performing the following activities: 07/02/2020 05/06/2020  Hearing? N N  Vision? N N  Difficulty concentrating or making decisions? N Y  Walking or climbing stairs? N Y  Dressing or bathing? N Y  Doing errands, shopping? N N  Preparing Food and eating ? N -  Using the Toilet? N -  In the past six months, have you accidently leaked urine? N -  Do you have problems with loss of bowel control? N -  Managing your Medications? N -  Managing your Finances? N -  Housekeeping or  managing your Housekeeping? N -  Some recent data might be hidden    Patient Education/ Literacy How often do you need to have someone help you when you read instructions, pamphlets, or other written materials from your doctor or pharmacy?: 1 - Never What is the last grade level you completed in school?: 10th grade  Exercise Current Exercise Habits: The patient does not participate in regular exercise at present, Exercise limited by: respiratory conditions(s);None identified  Diet Patient reports consuming 2 meals a day and 1 snack(s) a day Patient reports that his primary diet is: Regular Patient reports that she does have regular access to food.   Depression Screen PHQ 2/9 Scores 07/02/2020 05/29/2020 04/25/2020 03/13/2020 02/09/2020 10/02/2019 05/24/2019  PHQ - 2 Score 0 0 0 0 0 0 0  PHQ- 9 Score - - - - - - -     Fall Risk Fall Risk  07/02/2020 05/29/2020 05/29/2020 04/25/2020 03/13/2020  Falls in the past year? 1 1 0 0 1  Number falls in past yr: 1 1 - 1 1  Injury with Fall? 1 1 - 1 1  Comment - - - - -  Risk for fall due to : History of fall(s) History of fall(s) - History of fall(s);Impaired balance/gait;Impaired mobility History of fall(s);Impaired balance/gait  Follow up Falls evaluation completed Falls evaluation completed - Falls evaluation completed Falls evaluation completed     Objective:  Evan Stanley seemed alert and oriented and he participated appropriately during our telephone visit.  Blood Pressure Weight BMI  BP Readings from Last 3 Encounters:  06/10/20 140/70  05/29/20 105/61  05/20/20 (!) 146/41   Wt Readings from Last 3 Encounters:  06/10/20 168 lb (76.2 kg)  05/29/20 163 lb 3.2 oz (74 kg)  05/20/20 165 lb (74.8 kg)   BMI Readings from Last 1 Encounters:  06/10/20 27.96 kg/m    *Unable to obtain current vital signs, weight, and BMI due to telephone visit type  Hearing/Vision  . Mose did not seem to have difficulty with hearing/understanding  during the telephone conversation . Reports that he has had a formal eye exam by an eye care professional within the past year . Reports that he has not had a formal hearing evaluation within the past year *Unable to fully assess hearing and vision during telephone visit type  Cognitive Function: 6CIT Screen 07/02/2020 05/24/2019  What Year? 0 points -  What time? 0 points 0 points  Count back from 20 0 points 0 points  Months in reverse 0 points 0 points  Repeat phrase 0 points 0 points   (Normal:0-7, Significant for Dysfunction: >8)  Normal Cognitive Function Screening: Yes   Immunization & Health Maintenance Record Immunization History  Administered Date(s) Administered  . Fluad Quad(high Dose 65+) 05/23/2019, 04/25/2020  . Influenza Split 04/27/2016  . Influenza Whole 05/26/2010  . Influenza, High Dose Seasonal PF 05/20/2017, 05/23/2018  . Influenza,inj,Quad PF,6+ Mos 06/14/2013, 04/16/2014, 06/18/2015  . Influenza-Unspecified 05/14/2016  . Moderna Sars-Covid-2 Vaccination 08/16/2019, 09/13/2019  . Pneumococcal Conjugate-13 06/18/2015  . Pneumococcal Polysaccharide-23 12/16/2016  . Tdap 03/05/2011    Health Maintenance  Topic Date Due  . OPHTHALMOLOGY EXAM  12/07/2019  . COVID-19 Vaccine (3 - Booster for Moderna series) 03/12/2020  . FOOT EXAM  04/02/2020  . HEMOGLOBIN A1C  10/08/2020  . TETANUS/TDAP  03/04/2021  . INFLUENZA VACCINE  Completed  . Hepatitis C Screening  Completed  . PNA vac Low Risk Adult  Completed       Assessment  This is a routine wellness examination for Evan Stanley.  Health Maintenance: Due or Overdue Health Maintenance Due  Topic Date Due  . OPHTHALMOLOGY EXAM  12/07/2019  . COVID-19 Vaccine (3 - Booster for Moderna series) 03/12/2020  . FOOT EXAM  04/02/2020    Evan Stanley does not need a referral for Community Assistance: Care Management:   no Social Work:    no Prescription Assistance:  no Nutrition/Diabetes  Education:  no   Plan:  Personalized Goals Goals Addressed            This Visit's Progress   . Patient Stated       07/02/2020 AWV Goal: Fall Prevention  . Over the next year, patient will decrease their risk for falls by: o Using assistive devices, such as a cane or walker, as needed o Identifying fall risks within their home and correcting them by: - Removing throw rugs - Adding handrails to stairs or ramps - Removing clutter and keeping a clear pathway throughout the home - Increasing light, especially at night - Adding shower handles/bars - Raising toilet seat o Identifying potential personal risk factors for falls: - Medication side effects - Incontinence/urgency - Vestibular dysfunction - Hearing loss - Musculoskeletal disorders - Neurological disorders - Orthostatic hypotension        Personalized Health Maintenance & Screening Recommendations  Influenza vaccine  Lung Cancer Screening Recommended: no (Low Dose CT Chest recommended if Age 63-80 years, 30 pack-year currently smoking OR have quit w/in past 15 years) Hepatitis C Screening recommended: no HIV Screening recommended: no  Advanced Directives: Written information was not prepared per patient's request.  Referrals & Orders No orders of the defined types were placed in this encounter.   Follow-up Plan . Follow-up with [name], DO as planned    I have personally reviewed and noted the following in the patient's chart:   . Medical and social history . Use of alcohol, tobacco or illicit drugs  . Current medications and supplements . Functional  ability and status . Nutritional status . Physical activity . Advanced directives . List of other physicians . Hospitalizations, surgeries, and ER visits in previous 12 months . Vitals . Screenings to include cognitive, depression, and falls . Referrals and appointments  In addition, I have reviewed and discussed with Evan Stanley  certain preventive protocols, quality metrics, and best practice recommendations. A written personalized care plan for preventive services as well as general preventive health recommendations is available and can be mailed to the patient at his request.      Felicity Coyer, LPN    [id number]

## 2020-07-05 DIAGNOSIS — E1122 Type 2 diabetes mellitus with diabetic chronic kidney disease: Secondary | ICD-10-CM | POA: Diagnosis not present

## 2020-07-05 DIAGNOSIS — G9341 Metabolic encephalopathy: Secondary | ICD-10-CM | POA: Diagnosis not present

## 2020-07-05 DIAGNOSIS — D649 Anemia, unspecified: Secondary | ICD-10-CM | POA: Diagnosis not present

## 2020-07-05 DIAGNOSIS — H53132 Sudden visual loss, left eye: Secondary | ICD-10-CM | POA: Diagnosis not present

## 2020-07-05 DIAGNOSIS — Z794 Long term (current) use of insulin: Secondary | ICD-10-CM | POA: Diagnosis not present

## 2020-07-05 DIAGNOSIS — I5032 Chronic diastolic (congestive) heart failure: Secondary | ICD-10-CM | POA: Diagnosis not present

## 2020-07-05 DIAGNOSIS — I480 Paroxysmal atrial fibrillation: Secondary | ICD-10-CM | POA: Diagnosis not present

## 2020-07-05 DIAGNOSIS — I69398 Other sequelae of cerebral infarction: Secondary | ICD-10-CM | POA: Diagnosis not present

## 2020-07-05 DIAGNOSIS — N1832 Chronic kidney disease, stage 3b: Secondary | ICD-10-CM | POA: Diagnosis not present

## 2020-07-05 DIAGNOSIS — I130 Hypertensive heart and chronic kidney disease with heart failure and stage 1 through stage 4 chronic kidney disease, or unspecified chronic kidney disease: Secondary | ICD-10-CM | POA: Diagnosis not present

## 2020-07-08 DIAGNOSIS — N1832 Chronic kidney disease, stage 3b: Secondary | ICD-10-CM | POA: Diagnosis not present

## 2020-07-08 DIAGNOSIS — E1122 Type 2 diabetes mellitus with diabetic chronic kidney disease: Secondary | ICD-10-CM | POA: Diagnosis not present

## 2020-07-08 DIAGNOSIS — I480 Paroxysmal atrial fibrillation: Secondary | ICD-10-CM | POA: Diagnosis not present

## 2020-07-08 DIAGNOSIS — G9341 Metabolic encephalopathy: Secondary | ICD-10-CM | POA: Diagnosis not present

## 2020-07-08 DIAGNOSIS — H53132 Sudden visual loss, left eye: Secondary | ICD-10-CM | POA: Diagnosis not present

## 2020-07-08 DIAGNOSIS — Z794 Long term (current) use of insulin: Secondary | ICD-10-CM | POA: Diagnosis not present

## 2020-07-08 DIAGNOSIS — I130 Hypertensive heart and chronic kidney disease with heart failure and stage 1 through stage 4 chronic kidney disease, or unspecified chronic kidney disease: Secondary | ICD-10-CM | POA: Diagnosis not present

## 2020-07-08 DIAGNOSIS — D649 Anemia, unspecified: Secondary | ICD-10-CM | POA: Diagnosis not present

## 2020-07-08 DIAGNOSIS — I5032 Chronic diastolic (congestive) heart failure: Secondary | ICD-10-CM | POA: Diagnosis not present

## 2020-07-08 DIAGNOSIS — I69398 Other sequelae of cerebral infarction: Secondary | ICD-10-CM | POA: Diagnosis not present

## 2020-08-01 ENCOUNTER — Other Ambulatory Visit: Payer: Self-pay | Admitting: Family Medicine

## 2020-08-01 DIAGNOSIS — I152 Hypertension secondary to endocrine disorders: Secondary | ICD-10-CM

## 2020-08-01 DIAGNOSIS — E1169 Type 2 diabetes mellitus with other specified complication: Secondary | ICD-10-CM

## 2020-08-01 DIAGNOSIS — E1159 Type 2 diabetes mellitus with other circulatory complications: Secondary | ICD-10-CM

## 2020-08-01 NOTE — Telephone Encounter (Signed)
I do not have this is part of the medications he was on in the last several months.  I have come through my notes, cardiology's notes and the nursing home notes and none of them have this medication listed as an active medication.  Can you please check with the patient and sure that he is asking for this medicine and its not an automatic request by his mail order?

## 2020-08-01 NOTE — Telephone Encounter (Signed)
I am refusing, DC'd 05/09/20 at hospital discharge, so more than likely an automatic request from mail order

## 2020-08-08 ENCOUNTER — Other Ambulatory Visit: Payer: Self-pay

## 2020-08-08 ENCOUNTER — Telehealth: Payer: Self-pay | Admitting: Endocrinology

## 2020-08-08 NOTE — Telephone Encounter (Signed)
Faxed back to [facility] again.

## 2020-08-08 NOTE — Telephone Encounter (Signed)
Evan Stanley from [employer] called regarding pt Evan Stanley Trulicity paperwork. She needs Dr Serena Croissant on prescription part and asked for Dr to refax please (said Dr Dwyane Dee has the paperwork already).

## 2020-08-09 ENCOUNTER — Ambulatory Visit (INDEPENDENT_AMBULATORY_CARE_PROVIDER_SITE_OTHER): Payer: Medicare Other | Admitting: Family Medicine

## 2020-08-09 ENCOUNTER — Encounter: Payer: Self-pay | Admitting: Family Medicine

## 2020-08-09 DIAGNOSIS — J069 Acute upper respiratory infection, unspecified: Secondary | ICD-10-CM | POA: Diagnosis not present

## 2020-08-09 MED ORDER — BENZONATATE 200 MG PO CAPS: 200.0000 mg | ORAL_CAPSULE | Freq: Three times a day (TID) | ORAL | 0 refills | Status: DC | PRN

## 2020-08-09 NOTE — Progress Notes (Signed)
By someTelephone visit  Subjective: CC: URI PCP: Evan Norlander, DO [id number] Evan Stanley is a 80 y.o. male calls for telephone consult today. Patient provides verbal consent for consult held via phone.  Due to COVID-19 pandemic this visit was conducted virtually. This visit type was conducted due to national recommendations for restrictions regarding the COVID-19 Pandemic (e.g. social distancing, sheltering in place) in an effort to limit this patient's exposure and mitigate transmission in our community. All issues noted in this document were discussed and addressed.  A physical exam was not performed with this format.   Location of patient: home Location of provider: [facility] Others present for call: wife  1. Cough, hoarseness He reports cough that has been ongoing for 2 days.  He started with hoarseness 2 days ago.  He reports cough is productive and clear.  No fevers, myalgia, diarrhea, nausea, shortness of breath, or wheezing.  She reports Evan Stanley has been sick.  He is vaccinated against COVID x3.  He is using nyquil and tylenol and the nyquil is helping at bedtime.   ROS: Per HPI  Allergies  Allergen Reactions  . Baclofen Other (See Comments)    Stroke like symptoms  . Morphine And Related Other (See Comments)    Delirium    Past Medical History:  Diagnosis Date  . AKI (acute Stanley injury) ([place])   . Allergy   . Anxiety   . Arthritis    Right shoulder  . Atrial fibrillation ([place])   . Carotid artery stenosis   . Cataract   . CHF (congestive heart failure) ([place])   . CVA (cerebral vascular accident) ([place]) 04/28/2016  . Decreased vision    left eye  . Diabetes mellitus without complication ([place])    Takes Metformin  . Hyperlipidemia   . Hypertension   . Salmonella bacteremia 04/29/2016  . Sepsis ([place]) 04/29/2016  . Stroke ([place]) 11/18/2015   no deficits  . TIA (transient ischemic attack)    affected left eye  . Urgency of urination     Current Outpatient Medications:   .  acetaminophen (TYLENOL) 325 MG tablet, Take 2 tablets (650 mg total) by mouth every 6 (six) hours as needed for mild pain (or Fever >/= 101)., Disp: , Rfl:  .  Amino Acids-Protein Hydrolys (PRO-STAT 64 PO), Take 30 mLs by mouth 2 (two) times daily., Disp: , Rfl:  .  atorvastatin (LIPITOR) 40 MG tablet, TAKE 1 TABLET BY MOUTH  DAILY, Disp: 90 tablet, Rfl: 0 .  cyanocobalamin 1000 MCG tablet, Take 1,000 mcg by mouth daily., Disp: , Rfl:  .  ELIQUIS 5 MG TABS tablet, TAKE 1 TABLET BY MOUTH  TWICE DAILY, Disp: 180 tablet, Rfl: 0 .  hydrochlorothiazide (HYDRODIURIL) 25 MG tablet, TAKE 1 TABLET BY MOUTH  DAILY, Disp: 90 tablet, Rfl: 0 .  insulin aspart (NOVOLOG FLEXPEN) 100 UNIT/ML FlexPen, Inject 6 Units into the skin daily. With supper if blood sugar is greater than 150, Disp: , Rfl:  .  insulin degludec (TRESIBA FLEXTOUCH) 200 UNIT/ML FlexTouch Pen, Inject 50 Units into the skin daily., Disp: 15 mL, Rfl: 5 .  latanoprost (XALATAN) 0.005 % ophthalmic solution, Place 1 drop into the left eye at bedtime. , Disp: , Rfl:  .  lisinopril (ZESTRIL) 20 MG tablet, Take 20 mg by mouth daily., Disp: , Rfl:  .  metoprolol succinate (TOPROL-XL) 100 MG 24 hr tablet, TAKE 1 TABLET BY MOUTH  DAILY WITH OR IMMEDIATELY  FOLLOWING A MEAL, Disp: 90 tablet,  Rfl: 0 .  ONETOUCH ULTRA test strip, , Disp: , Rfl:  .  UNABLE TO FIND, Diet: NAS and consistent carbohydrate, Disp: , Rfl:  .  vitamin C (ASCORBIC ACID) 500 MG tablet, Take 500 mg by mouth daily., Disp: , Rfl:  .  vitamin E 1000 UNIT capsule, Take 1,000 Units by mouth daily., Disp: , Rfl:   Assessment/ Plan: 80 y.o. male   Viral URI with cough - Plan: benzonatate (TESSALON) 200 MG capsule It sounds viral.  He has been triple vaccinated.  I did offer COVID testing but he declined this today.  Advised to continue supportive care.  Caution with renal function.  Tessalon Perles have been sent.  Reasons for return discussed.  Red flags reviewed.  He will follow-up as  needed   Start time: 12:37pm End time: 12:42pm  Total time spent on patient care (including telephone call/ virtual visit): 5 minutes  [place], [place] [phone]

## 2020-08-12 ENCOUNTER — Other Ambulatory Visit: Payer: Self-pay

## 2020-08-12 ENCOUNTER — Encounter: Payer: Self-pay | Admitting: Endocrinology

## 2020-08-12 ENCOUNTER — Ambulatory Visit (INDEPENDENT_AMBULATORY_CARE_PROVIDER_SITE_OTHER): Payer: Medicare Other | Admitting: Endocrinology

## 2020-08-12 ENCOUNTER — Other Ambulatory Visit: Payer: Medicare Other

## 2020-08-12 VITALS — BP 126/72 | HR 78 | Ht 65.0 in | Wt 167.2 lb

## 2020-08-12 DIAGNOSIS — Z794 Long term (current) use of insulin: Secondary | ICD-10-CM | POA: Diagnosis not present

## 2020-08-12 DIAGNOSIS — N289 Disorder of kidney and ureter, unspecified: Secondary | ICD-10-CM | POA: Diagnosis not present

## 2020-08-12 DIAGNOSIS — I10 Essential (primary) hypertension: Secondary | ICD-10-CM | POA: Diagnosis not present

## 2020-08-12 DIAGNOSIS — E1165 Type 2 diabetes mellitus with hyperglycemia: Secondary | ICD-10-CM

## 2020-08-12 LAB — BASIC METABOLIC PANEL
BUN: 19 mg/dL (ref 6–23)
CO2: 33 mEq/L — ABNORMAL HIGH (ref 19–32)
Calcium: 9.7 mg/dL (ref 8.4–10.5)
Chloride: 101 mEq/L (ref 96–112)
Creatinine, Ser: 1.71 mg/dL — ABNORMAL HIGH (ref 0.40–1.50)
GFR: 38.12 mL/min — ABNORMAL LOW (ref >60.00)
Glucose, Bld: 313 mg/dL — ABNORMAL HIGH (ref 70–99)
Potassium: 4.6 mEq/L (ref 3.5–5.1)
Sodium: 139 mEq/L (ref 135–145)

## 2020-08-12 LAB — MICROALBUMIN / CREATININE URINE RATIO
Creatinine,U: 106.6 mg/dL
Microalb Creat Ratio: 13.5 mg/g (ref 0.0–30.0)
Microalb, Ur: 14.4 mg/dL — ABNORMAL HIGH (ref 0.0–1.9)

## 2020-08-12 LAB — POCT GLYCOSYLATED HEMOGLOBIN (HGB A1C): Hemoglobin A1C: 7.1 % — AB (ref 4.0–5.6)

## 2020-08-12 NOTE — Progress Notes (Signed)
Patient ID: Evan Stanley, male   DOB: Apr 19, 1941, 80 y.o.   MRN: [id number]          Reason for Appointment: Follow-up for Type 2 Diabetes    History of Present Illness:          Date of diagnosis of type 2 diabetes mellitus: 2010       Background history:   He thinks he was diagnosed to have diabetes after his coronary bypass surgery and blood sugars are not very high He had been on metformin until about 05/2017 Also at some point had taken glimepiride Usually appears to have had A1c just above 7%  Recent history:   INSULIN regimen is: [place]  50  units daily, Humalog  6 units at dinner     Non-insulin hypoglycemic drugs the patient is taking are: Trulicity 1.5 mg weekly  His A1c was back down to 6.4, now 7.1  Current management, blood sugar patterns and problems identified:  Not clear why his A1c is relatively higher  He has periodic high readings over 200 after dinner  Some of his high sugars in the evenings are related to fried foods like fried chicken lower [place] fries, also sometimes eating ice cream  On an average blood sugar may be somewhat higher than on his last visit  He does not check his sugars after breakfast or lunch but sometimes will eat carbohydrates like waffles or sandwiches for those meals  He says he will eat waffles in the morning if blood sugars low normal  No hypoglycemic symptoms or any low sugars below 82  Appetite is about the same and weight has not changed much  Has been getting his Trulicity and insulin from patient assistance programs        Side effects from medications have been: None  Glucose monitoring:  done about 2 times a day         Glucometer: One Touch.       Blood Glucose readings by review of home monitor download show   PRE-MEAL Fasting Lunch Dinner Bedtime Overall  Glucose range:  78-171     78-292  Median:  110     130+/-47   POST-MEAL PC Breakfast PC Lunch PC Dinner  Glucose range:    98-292  Mean/median:     158   Previously:  PRE-MEAL Fasting Lunch Dinner Bedtime Overall  Glucose range:  75-153      Mean/median:  109     127   POST-MEAL PC Breakfast PC Lunch PC Dinner  Glucose range:  84-211   89-209  Mean/median:  109   146    Self-care: The diet that the patient has been following is: tries to limit sweets.     Meal times are:  Breakfast is at after 9 AM, dinner around 6 PM  Typical meal intake: Breakfast is eggs and meat and toast, sometimes waffles            Dietician visit, most recent: 2019                Weight history:  Wt Readings from Last 3 Encounters:  08/12/20 167 lb 3.2 oz (75.8 kg)  06/10/20 168 lb (76.2 kg)  05/29/20 163 lb 3.2 oz (74 kg)    Glycemic control:   Lab Results  Component Value Date   HGBA1C 6.4 (A) 04/10/2020   HGBA1C 7.4 (H) 12/25/2019   HGBA1C 5.8 (A) 09/06/2019   Lab Results  Component Value Date  MICROALBUR 5.1 (H) 09/06/2019   LDLCALC 47 12/25/2019   CREATININE 1.68 (H) 05/29/2020   Lab Results  Component Value Date   MICRALBCREAT 2.4 09/06/2019    Lab Results  Component Value Date   FRUCTOSAMINE 323 (H) 04/22/2018      Allergies as of 08/12/2020      Reactions   Baclofen Other (See Comments)   Stroke like symptoms   Morphine And Related Other (See Comments)   Delirium       Medication List       Accurate as of August 12, 2020  9:19 AM. If you have any questions, ask your nurse or doctor.        acetaminophen 325 MG tablet Commonly known as: TYLENOL Take 2 tablets (650 mg total) by mouth every 6 (six) hours as needed for mild pain (or Fever >/= 101).   atorvastatin 40 MG tablet Commonly known as: LIPITOR TAKE 1 TABLET BY MOUTH  DAILY   benzonatate 200 MG capsule Commonly known as: TESSALON Take 1 capsule (200 mg total) by mouth 3 (three) times daily as needed for cough.   cyanocobalamin 1000 MCG tablet Take 1,000 mcg by mouth daily.   Eliquis 5 MG Tabs tablet Generic drug: apixaban TAKE 1 TABLET  BY MOUTH  TWICE DAILY   hydrochlorothiazide 25 MG tablet Commonly known as: HYDRODIURIL TAKE 1 TABLET BY MOUTH  DAILY   latanoprost 0.005 % ophthalmic solution Commonly known as: XALATAN Place 1 drop into the left eye at bedtime.   lisinopril 20 MG tablet Commonly known as: ZESTRIL Take 20 mg by mouth daily.   metoprolol succinate 100 MG 24 hr tablet Commonly known as: TOPROL-XL TAKE 1 TABLET BY MOUTH  DAILY WITH OR IMMEDIATELY  FOLLOWING A MEAL   NovoLOG FlexPen 100 UNIT/ML FlexPen Generic drug: insulin aspart Inject 6 Units into the skin daily. With supper if blood sugar is greater than 150   OneTouch Ultra test strip Generic drug: glucose blood   PRO-STAT 64 PO Take 30 mLs by mouth 2 (two) times daily.   [name] FlexTouch 200 UNIT/ML FlexTouch Pen Generic drug: insulin degludec Inject 50 Units into the skin daily.   UNABLE TO FIND Diet: NAS and consistent carbohydrate   vitamin C 500 MG tablet Commonly known as: ASCORBIC ACID Take 500 mg by mouth daily.   vitamin E 1000 UNIT capsule Take 1,000 Units by mouth daily.       Allergies:  Allergies  Allergen Reactions  . Baclofen Other (See Comments)    Stroke like symptoms  . Morphine And Related Other (See Comments)    Delirium     Past Medical History:  Diagnosis Date  . AKI (acute kidney injury) ([place])   . Allergy   . Anxiety   . Arthritis    Right shoulder  . Atrial fibrillation ([place])   . Carotid artery stenosis   . Cataract   . CHF (congestive heart failure) ([place])   . CVA (cerebral vascular accident) ([place]) 04/28/2016  . Decreased vision    left eye  . Diabetes mellitus without complication ([place])    Takes Metformin  . Hyperlipidemia   . Hypertension   . Salmonella bacteremia 04/29/2016  . Sepsis ([place]) 04/29/2016  . Stroke ([place]) 11/18/2015   no deficits  . TIA (transient ischemic attack)    affected left eye  . Urgency of urination     Past Surgical History:  Procedure Laterality Date   . AORTIC VALVE REPLACEMENT    .  BACK SURGERY  80s  . CARDIAC VALVE REPLACEMENT  11/01/2008   21-mm Edwards Pericardial Magna - Ease valve  . ENDARTERECTOMY Right 01/13/2016   Procedure: ENDARTERECTOMY CAROTID - RIGHT;  Surgeon: Conrad [place], MD;  Location: [facility];  Service: Vascular;  Laterality: Right;  . EYE SURGERY    . laser surgery, left eye  Left 10-29-2015    retinal surgery/ Deloria Lair MD   . PERIPHERAL VASCULAR CATHETERIZATION Right 11/18/2015   Procedure: Carotid Angiography;  Surgeon: Conrad [place], MD;  Location: [facility];  Service: Cardiovascular;  Laterality: Right;  . PERIPHERAL VASCULAR CATHETERIZATION N/A 11/18/2015   Procedure: Aortic Arch Angiography;  Surgeon: Conrad [place], MD;  Location: [facility];  Service: Cardiovascular;  Laterality: N/A;  . TEE WITHOUT CARDIOVERSION N/A 05/05/2016   Procedure: TRANSESOPHAGEAL ECHOCARDIOGRAM (TEE);  Surgeon: Lelon Perla, MD;  Location: [facility] ENDOSCOPY;  Service: Cardiovascular;  Laterality: N/A;    Family History  Problem Relation Age of Onset  . Heart disease Mother   . Cancer Mother        breast  . Stroke Father 75  . Hypertension Father   . Early death Sister        infant death  . Heart disease Brother   . Hydrocephalus Brother   . Heart disease Brother   . Diabetes Brother   . Cancer Brother        prostat  . Healthy Son   . Healthy Son     Social History:  reports that he quit smoking about 34 years ago. His smoking use included cigarettes. He has a 40.00 pack-year smoking history. He has never used smokeless tobacco. He reports that he does not drink alcohol and does not use drugs.   Review of Systems   Lipid history: Lipids controlled well on atorvastatin 40 mg daily prescribed by his PCP   Has history of CAD and CVD    Lab Results  Component Value Date   CHOL 89 12/25/2019   HDL 30 (L) 12/25/2019   LDLCALC 47 12/25/2019   LDLDIRECT 62 02/16/2017   TRIG 60 12/25/2019   CHOLHDL 3.0  12/25/2019           Hypertension:  His treatment includes hydrochlorothiazide 25 mg and lisinopril 20 mg daily,   prescribed by PCP Also monitors at home  BP Readings from Last 3 Encounters:  08/12/20 126/72  06/10/20 140/70  05/29/20 105/61     CKD: Creatinine variable as follows Did have renal insufficiency when he was hospitalized in 11/21  Lab Results  Component Value Date   CREATININE 1.68 (H) 05/29/2020   CREATININE 1.47 (H) 05/13/2020   CREATININE 1.44 (H) 05/09/2020     Most recent foot exam: 9/20    Physical Examination:  BP 126/72   Pulse 78   Ht 5' 5"  (1.651 m)   Wt 167 lb 3.2 oz (75.8 kg)   SpO2 99%   BMI 27.82 kg/m       ASSESSMENT:  Diabetes type 2, on insulin   See history of present illness for detailed discussion of current diabetes management, blood sugar patterns and problems identified  His A1c is somewhat higher at 7.1  Not clear if he is having high readings after breakfast and lunch since he does not monitor these Blood sugars after dinner are on an average fairly good with sporadic increase based on his diet He also forgets to increase his suppertime dose when he  is eating higher glycemic index foods such as fried foods or ice cream  He is benefiting from Trulicity also Fasting blood sugars are near normal with lowest reading in the 70s  HYPERTENSION: Well-controlled  Renal insufficiency: He needs follow-up labs today Also check microalbumin  PLAN:    He will reduce his [name] to 48 units morning and down to 46 also if needed to keep morning sugars mostly between 90-130  Discussed using the freestyle libre system but currently with using only 2 insulin injections a day may not qualify  May take 8-9 units Humalog at dinnertime if eating fried food or ice cream instead of 6 units, his wife will help him remember this  Likely needs to take 4 units for breakfast and lunch if blood sugars after eating are more than  180  Continue 1.5 Trulicity  He can check his blood sugars only every other day in the morning but more readings after lunch and breakfast and this was given in writing   There are no Patient Instructions on file for this visit.     [name] 08/12/2020, 9:19 AM   Note: This office note was prepared with Dragon voice recognition system technology. Any transcriptional errors that result from this process are unintentional.

## 2020-08-12 NOTE — Patient Instructions (Addendum)
Check blood sugars on waking up 3 days a week  Also check blood sugars about 2 hours after meals and do this after different meals by rotation  Recommended blood sugar levels on waking up are 90-130 and about 2 hours after meal is 130-160  Please bring your blood sugar monitor to each visit, thank you  Tresiba 48 units   Humalog 6-9 at supper

## 2020-08-13 ENCOUNTER — Telehealth: Payer: Self-pay | Admitting: Endocrinology

## 2020-08-13 ENCOUNTER — Other Ambulatory Visit: Payer: Self-pay | Admitting: Family Medicine

## 2020-08-13 DIAGNOSIS — N1832 Chronic kidney disease, stage 3b: Secondary | ICD-10-CM

## 2020-08-13 DIAGNOSIS — Z794 Long term (current) use of insulin: Secondary | ICD-10-CM

## 2020-08-13 LAB — FRUCTOSAMINE: Fructosamine: 274 umol/L (ref 0–285)

## 2020-08-13 NOTE — Telephone Encounter (Signed)
Patient returning call about lab results - ph# [phone]

## 2020-08-15 ENCOUNTER — Telehealth: Payer: Self-pay | Admitting: *Deleted

## 2020-08-15 NOTE — Telephone Encounter (Signed)
Results given to patients wife.

## 2020-08-30 DIAGNOSIS — D631 Anemia in chronic kidney disease: Secondary | ICD-10-CM | POA: Diagnosis not present

## 2020-08-30 DIAGNOSIS — N189 Chronic kidney disease, unspecified: Secondary | ICD-10-CM | POA: Diagnosis not present

## 2020-08-30 DIAGNOSIS — I129 Hypertensive chronic kidney disease with stage 1 through stage 4 chronic kidney disease, or unspecified chronic kidney disease: Secondary | ICD-10-CM | POA: Diagnosis not present

## 2020-08-30 DIAGNOSIS — E1122 Type 2 diabetes mellitus with diabetic chronic kidney disease: Secondary | ICD-10-CM | POA: Diagnosis not present

## 2020-08-30 DIAGNOSIS — I5032 Chronic diastolic (congestive) heart failure: Secondary | ICD-10-CM | POA: Diagnosis not present

## 2020-09-02 ENCOUNTER — Other Ambulatory Visit: Payer: Self-pay | Admitting: *Deleted

## 2020-09-02 MED ORDER — TRULICITY 1.5 MG/0.5ML ~~LOC~~ SOAJ
1.5000 mg | SUBCUTANEOUS | 5 refills | Status: DC
Start: 2020-09-02 — End: 2023-10-12

## 2020-09-05 DIAGNOSIS — L84 Corns and callosities: Secondary | ICD-10-CM | POA: Diagnosis not present

## 2020-09-05 DIAGNOSIS — M79676 Pain in unspecified toe(s): Secondary | ICD-10-CM | POA: Diagnosis not present

## 2020-09-05 DIAGNOSIS — E1142 Type 2 diabetes mellitus with diabetic polyneuropathy: Secondary | ICD-10-CM | POA: Diagnosis not present

## 2020-09-05 DIAGNOSIS — B351 Tinea unguium: Secondary | ICD-10-CM | POA: Diagnosis not present

## 2020-09-10 ENCOUNTER — Other Ambulatory Visit: Payer: Self-pay | Admitting: Family Medicine

## 2020-09-16 ENCOUNTER — Telehealth: Payer: Self-pay | Admitting: *Deleted

## 2020-09-16 DIAGNOSIS — Z794 Long term (current) use of insulin: Secondary | ICD-10-CM | POA: Diagnosis not present

## 2020-09-16 DIAGNOSIS — E119 Type 2 diabetes mellitus without complications: Secondary | ICD-10-CM | POA: Diagnosis not present

## 2020-09-16 DIAGNOSIS — Z961 Presence of intraocular lens: Secondary | ICD-10-CM | POA: Diagnosis not present

## 2020-09-16 DIAGNOSIS — H4089 Other specified glaucoma: Secondary | ICD-10-CM | POA: Diagnosis not present

## 2020-09-16 DIAGNOSIS — H2511 Age-related nuclear cataract, right eye: Secondary | ICD-10-CM | POA: Diagnosis not present

## 2020-09-16 LAB — HM DIABETES EYE EXAM

## 2020-09-16 NOTE — Telephone Encounter (Signed)
LVM--Novo Nordisk pt assistant -Novolog flexpen (1 box) ready to be pick up at the front office.

## 2020-09-17 NOTE — Telephone Encounter (Signed)
Pt's wife states she will pick it up either this evening or tomorrow FYI

## 2020-09-18 ENCOUNTER — Ambulatory Visit: Payer: Medicare Other | Admitting: Family Medicine

## 2020-09-20 ENCOUNTER — Ambulatory Visit (INDEPENDENT_AMBULATORY_CARE_PROVIDER_SITE_OTHER): Payer: Medicare Other | Admitting: Family Medicine

## 2020-09-20 ENCOUNTER — Other Ambulatory Visit: Payer: Self-pay

## 2020-09-20 VITALS — BP 116/60 | HR 77 | Temp 97.9°F | Ht 65.0 in | Wt 172.0 lb

## 2020-09-20 DIAGNOSIS — I152 Hypertension secondary to endocrine disorders: Secondary | ICD-10-CM

## 2020-09-20 DIAGNOSIS — I6990 Unspecified sequelae of unspecified cerebrovascular disease: Secondary | ICD-10-CM

## 2020-09-20 DIAGNOSIS — E1122 Type 2 diabetes mellitus with diabetic chronic kidney disease: Secondary | ICD-10-CM

## 2020-09-20 DIAGNOSIS — I480 Paroxysmal atrial fibrillation: Secondary | ICD-10-CM | POA: Diagnosis not present

## 2020-09-20 DIAGNOSIS — N1832 Chronic kidney disease, stage 3b: Secondary | ICD-10-CM

## 2020-09-20 DIAGNOSIS — E1159 Type 2 diabetes mellitus with other circulatory complications: Secondary | ICD-10-CM | POA: Diagnosis not present

## 2020-09-20 DIAGNOSIS — F4321 Adjustment disorder with depressed mood: Secondary | ICD-10-CM

## 2020-09-20 DIAGNOSIS — Z794 Long term (current) use of insulin: Secondary | ICD-10-CM | POA: Diagnosis not present

## 2020-09-20 NOTE — Progress Notes (Signed)
Subjective: CC: HTN PCP: [name], DO DPO:EUMPNT L Sobotka is a 80 y.o. male presenting to clinic today for:  1. Type 2 Diabetes with hypertension, hyperlipidemia, CKD:  Patient is very closely followed by endocrinology.  Last A1c was 7.1.  No medication changes made but patient is trying to work on his diet.  He will be getting labs done for his nephrologist soon.  He avoids NSAIDs and tries to hydrate well.  No reports of chest pain, shortness of breath, decreased urine output or falls  Last eye exam: Up-to-date Last foot exam: Needs Last A1c:  Lab Results  Component Value Date   HGBA1C 7.1 (A) 08/12/2020   Nephropathy screen indicated?:  On ACE inhibitor Last flu, zoster and/or pneumovax:  Immunization History  Administered Date(s) Administered  . Fluad Quad(high Dose 65+) 05/23/2019, 04/25/2020  . Influenza Split 04/27/2016  . Influenza Whole 05/26/2010  . Influenza, High Dose Seasonal PF 05/20/2017, 05/23/2018  . Influenza,inj,Quad PF,6+ Mos 06/14/2013, 04/16/2014, 06/18/2015  . Influenza-Unspecified 05/14/2016  . Moderna Sars-Covid-2 Vaccination 08/16/2019, 09/13/2019, 07/04/2020  . Pneumococcal Conjugate-13 06/18/2015  . Pneumococcal Polysaccharide-23 12/16/2016  . Tdap 03/05/2011     2.  Grief reaction Patient notes that his brother passed away from complications of heart disease 1 month ago.  He is only 1 of 2 remaining siblings left to live, 3 have now passed.  He does feel well supported at home.  Denies severe depression  ROS: Per HPI  Allergies  Allergen Reactions  . Baclofen Other (See Comments)    Stroke like symptoms  . Morphine And Related Other (See Comments)    Delirium    Past Medical History:  Diagnosis Date  . AKI (acute kidney injury) ([place])   . Allergy   . Anxiety   . Arthritis    Right shoulder  . Atrial fibrillation ([place])   . Carotid artery stenosis   . Cataract   . CHF (congestive heart failure) ([place])   . CVA (cerebral  vascular accident) ([place]) 04/28/2016  . Decreased vision    left eye  . Diabetes mellitus without complication ([place])    Takes Metformin  . Hyperlipidemia   . Hypertension   . Salmonella bacteremia 04/29/2016  . Sepsis ([place]) 04/29/2016  . Stroke ([place]) 11/18/2015   no deficits  . TIA (transient ischemic attack)    affected left eye  . Urgency of urination     Current Outpatient Medications:  .  acetaminophen (TYLENOL) 325 MG tablet, Take 2 tablets (650 mg total) by mouth every 6 (six) hours as needed for mild pain (or Fever >/= 101)., Disp: , Rfl:  .  Amino Acids-Protein Hydrolys (PRO-STAT 64 PO), Take 30 mLs by mouth 2 (two) times daily., Disp: , Rfl:  .  atorvastatin (LIPITOR) 40 MG tablet, TAKE 1 TABLET BY MOUTH  DAILY, Disp: 90 tablet, Rfl: 0 .  benzonatate (TESSALON) 200 MG capsule, Take 1 capsule (200 mg total) by mouth 3 (three) times daily as needed for cough., Disp: 20 capsule, Rfl: 0 .  cyanocobalamin 1000 MCG tablet, Take 1,000 mcg by mouth daily., Disp: , Rfl:  .  Dulaglutide (TRULICITY) 1.5 IR/4.4RX SOPN, Inject 1.5 mg into the skin once a week. Inject the contents of one pen once per week, Disp: 6 mL, Rfl: 5 .  ELIQUIS 5 MG TABS tablet, TAKE 1 TABLET BY MOUTH  TWICE DAILY, Disp: 180 tablet, Rfl: 0 .  hydrochlorothiazide (HYDRODIURIL) 25 MG tablet, TAKE 1 TABLET BY MOUTH  DAILY, Disp: 90 tablet, Rfl: 0 .  insulin aspart (NOVOLOG FLEXPEN) 100 UNIT/ML FlexPen, Inject 6 Units into the skin daily. With supper if blood sugar is greater than 150, Disp: , Rfl:  .  insulin degludec (TRESIBA FLEXTOUCH) 200 UNIT/ML FlexTouch Pen, Inject 50 Units into the skin daily., Disp: 15 mL, Rfl: 5 .  latanoprost (XALATAN) 0.005 % ophthalmic solution, Place 1 drop into the left eye at bedtime. , Disp: , Rfl:  .  lisinopril (ZESTRIL) 20 MG tablet, TAKE 1 TABLET BY MOUTH  DAILY, Disp: 90 tablet, Rfl: 3 .  metoprolol succinate (TOPROL-XL) 100 MG 24 hr tablet, TAKE 1 TABLET BY MOUTH  DAILY WITH OR  IMMEDIATELY  FOLLOWING A MEAL, Disp: 90 tablet, Rfl: 0 .  ONETOUCH ULTRA test strip, , Disp: , Rfl:  .  UNABLE TO FIND, Diet: NAS and consistent carbohydrate, Disp: , Rfl:  .  vitamin C (ASCORBIC ACID) 500 MG tablet, Take 500 mg by mouth daily., Disp: , Rfl:  .  vitamin E 1000 UNIT capsule, Take 1,000 Units by mouth daily., Disp: , Rfl:  Social History   Socioeconomic History  . Marital status: Married    Spouse name: Not on file  . Number of children: 2  . Years of education: 58  . Highest education level: 11th grade  Occupational History  . Occupation: maintenance    Employer: [employer]    Comment: Retired  Tobacco Use  . Smoking status: Former Smoker    Packs/day: 2.00    Years: 20.00    Pack years: 40.00    Types: Cigarettes    Quit date: 07/20/1986    Years since quitting: 34.1  . Smokeless tobacco: Never Used  Vaping Use  . Vaping Use: Never used  Substance and Sexual Activity  . Alcohol use: No    Alcohol/week: 0.0 standard drinks  . Drug use: No  . Sexual activity: Yes  Other Topics Concern  . Not on file  Social History Narrative  . Not on file   Social Determinants of Health   Financial Resource Strain: Not on file  Food Insecurity: Not on file  Transportation Needs: Not on file  Physical Activity: Not on file  Stress: Not on file  Social Connections: Not on file  Intimate Partner Violence: Not on file   Family History  Problem Relation Age of Onset  . Heart disease Mother   . Cancer Mother        breast  . Stroke Father 48  . Hypertension Father   . Early death Sister        infant death  . Heart disease Brother   . Hydrocephalus Brother   . Heart disease Brother   . Diabetes Brother   . Cancer Brother        prostat  . Healthy Son   . Healthy Son     Objective: Office vital signs reviewed. BP 116/60   Pulse 77   Temp 97.9 F (36.6 C) (Temporal)   Ht 5' 5"  (1.651 m)   Wt 172 lb (78 kg)   SpO2 98%   BMI 28.62 kg/m   Physical  Examination:  General: Awake, alert, well appearing male, well nourished, No acute distress HEENT: Normal, sclera white Cardio: regular rate and rhythm, S1S2 heard, no murmurs appreciated Pulm: clear to auscultation bilaterally, no wheezes, rhonchi or rales; normal work of breathing on room air Extremities: warm, well perfused, No edema, cyanosis or clubbing; +2 pulses bilaterally MSK: ambulating  with cane Neuro: see DM foot Diabetic Foot Exam - Simple   Simple Foot Form Diabetic Foot exam was performed with the following findings: Yes 09/20/2020  2:18 PM  Visual Inspection No deformities, no ulcerations, no other skin breakdown bilaterally: Yes Sensation Testing Intact to touch and monofilament testing bilaterally: Yes Pulse Check Posterior Tibialis and Dorsalis pulse intact bilaterally: Yes Comments       Assessment/ Plan: 80 y.o. male   Hypertension associated with diabetes ([place])  Paroxysmal A-fib ([place])  Late effects of cerebrovascular disease  Type 2 diabetes mellitus with stage 3b chronic kidney disease, with long-term current use of insulin ([facility])  Grief reaction  Blood pressure well controlled.  Continue current regimen  Patient seen both rhythm and rate controlled.  He is affording the Eliquis without difficulty at present  He seems to be getting along without much difficulty.  Blood sugar has been somewhat borderline with last A1c of 7.1.  He is followed closely by podiatry and endocrinology.  No changes to current medications.  Unfortunately, his brother did recently passed from complications of heart disease.  He seems to be coping okay with this right now but I encouraged him to contact me should anything change  No orders of the defined types were placed in this encounter.  No orders of the defined types were placed in this encounter.    [name], DO [place] [phone]  .

## 2020-09-20 NOTE — Patient Instructions (Signed)
Plan for fasting labs at your next visit.  You need cholesterol checked soon.

## 2020-10-23 IMAGING — MR MR HEAD W/O CM
12 of 13 series · 44 of 48 positions shown · non-contrast
Comparison: Prior CTs from earlier the same day.

CLINICAL DATA: Initial evaluation for acute stroke.

EXAM:
MRI HEAD WITHOUT CONTRAST
TECHNIQUE: Multiplanar, multiecho pulse sequences of the brain and surrounding
structures were obtained without intravenous contrast.

[Series 5: DWI · axial · 3.0mm · 0.88mm/px · z∈[-70,+76]mm · 8 of 104 slices shown (1 of 4)]
[im 1/104]
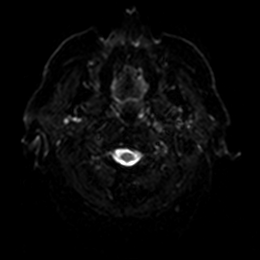
[im 15/104]
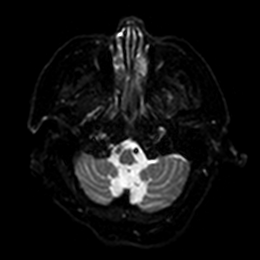
[im 30/104]
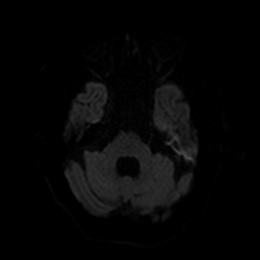
[im 45/104]
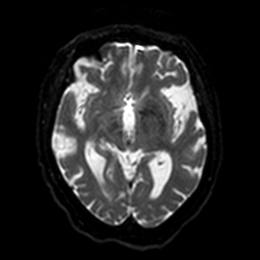
[im 59/104]
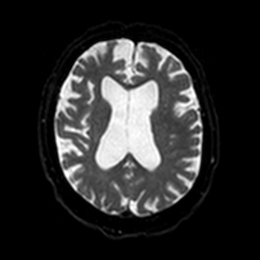
[im 74/104]
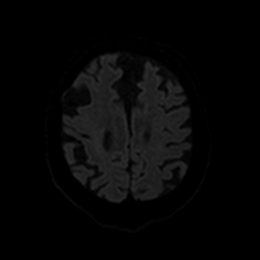
[im 89/104]
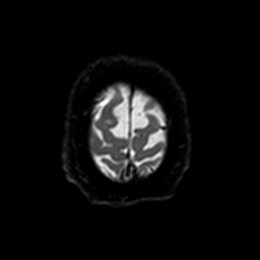
[im 104/104]
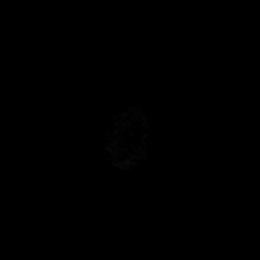

[Series 6: DWI · axial · 3.0mm · 0.88mm/px · z∈[-70,+76]mm · 4 of 52 slices shown (2 of 4)]
[im 1/52]
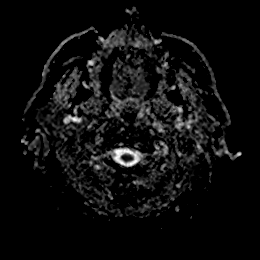
[im 18/52]
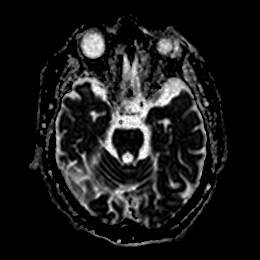
[im 35/52]
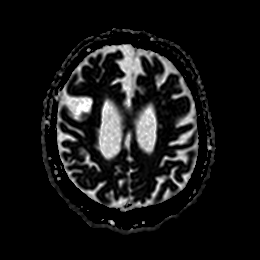
[im 52/52]
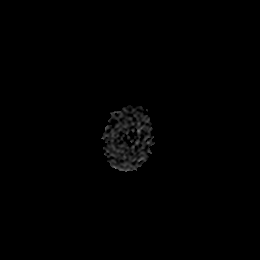

[Series 7: DWI · coronal · 4.0mm · 0.88mm/px · 6 of 72 slices shown (3 of 4)]
[im 1/72]
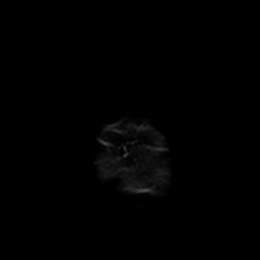
[im 15/72]
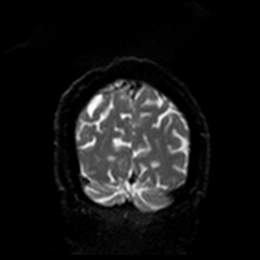
[im 29/72]
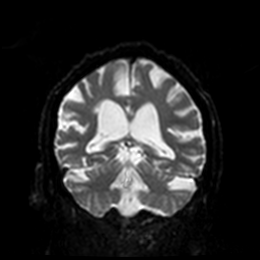
[im 43/72]
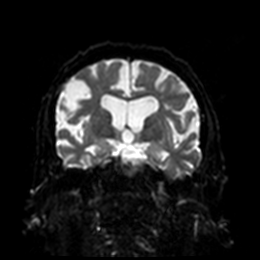
[im 57/72]
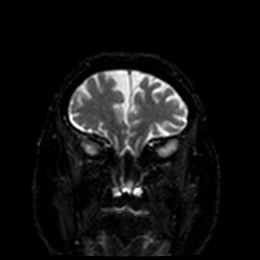
[im 72/72]
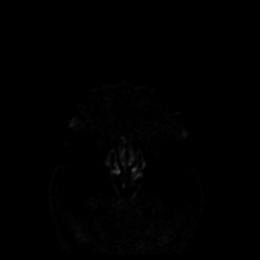

[Series 8: DWI · coronal · 4.0mm · 0.88mm/px · 3 of 36 slices shown (4 of 4)]
[im 1/36]
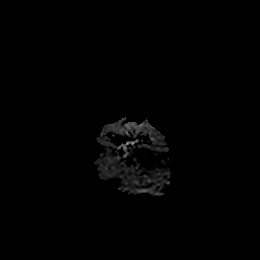
[im 18/36]
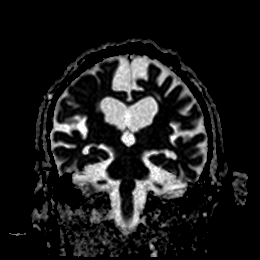
[im 36/36]
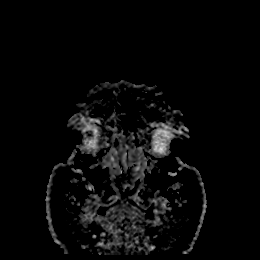

[Series 9: FLAIR · axial · 5.0mm · 0.45mm/px · z∈[-66,+71]mm · 2 of 25 slices shown]
[im 1/25]
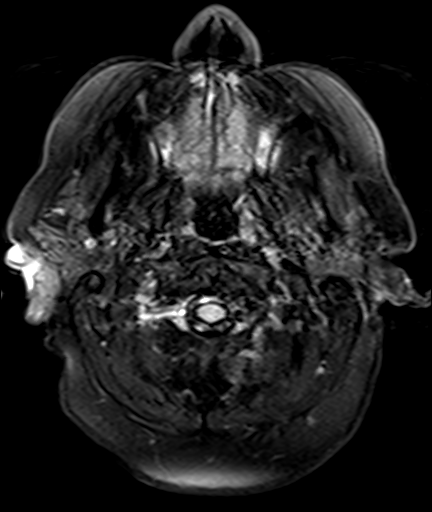
[im 25/25]
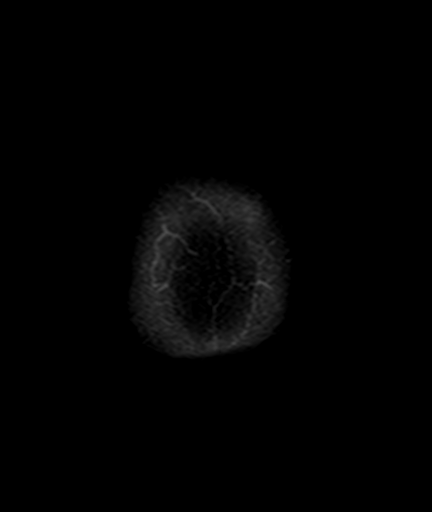

[Series 10: mag_images · axial · 3.0mm · 0.90mm/px · z∈[-71,+74]mm · 4 of 52 slices shown]
[im 1/52]
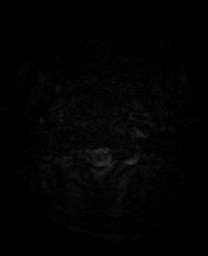
[im 18/52]
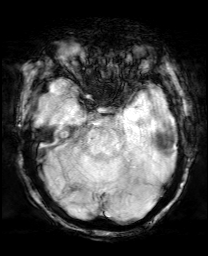
[im 35/52]
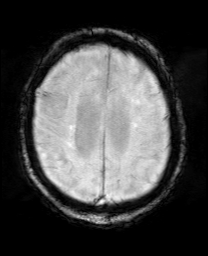
[im 52/52]
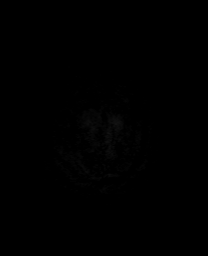

[Series 11: pha_images · axial · 3.0mm · 0.90mm/px · z∈[-71,+74]mm · 4 of 51 slices shown]
[im 1/51]
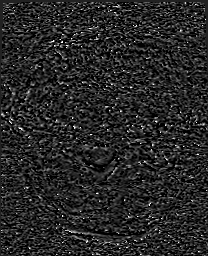
[im 17/51]
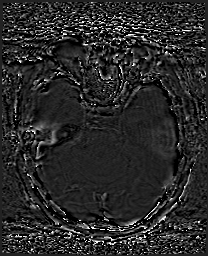
[im 34/51]
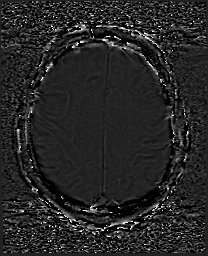
[im 51/51]
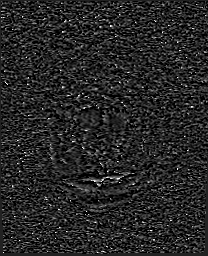

[Series 12: swi_images · axial · 3.0mm · 0.90mm/px · z∈[-71,+74]mm · 4 of 52 slices shown]
[im 1/52]
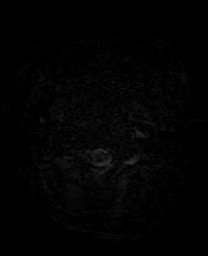
[im 18/52]
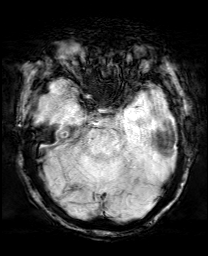
[im 35/52]
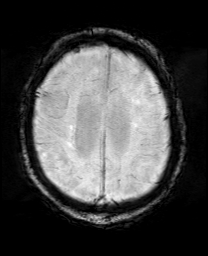
[im 52/52]
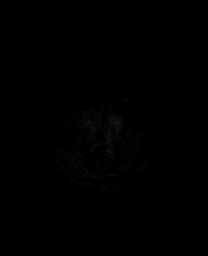

[Series 13: mip_images(sw) · axial · 24.0mm · 0.90mm/px · z∈[-61,+64]mm · 3 of 45 slices shown]
[im 1/45]
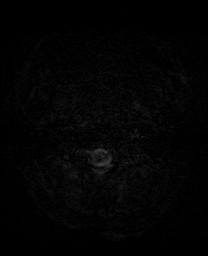
[im 23/45]
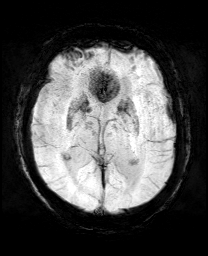
[im 45/45]
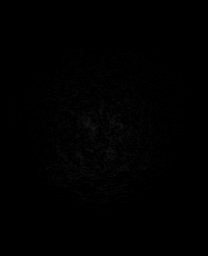

[Series 14: T1 · sagittal · 5.0mm · 0.75mm/px · 2 of 25 slices shown]
[im 1/25]
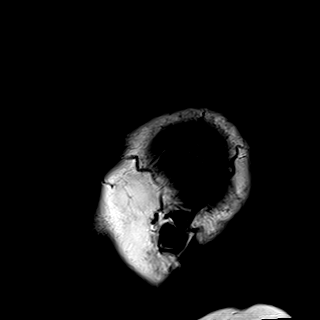
[im 25/25]
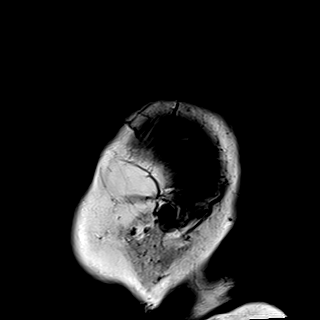

[Series 15: T2 · axial · 5.0mm · 0.72mm/px · z∈[-65,+72]mm · 2 of 25 slices shown (1 of 2)]
[im 1/25]
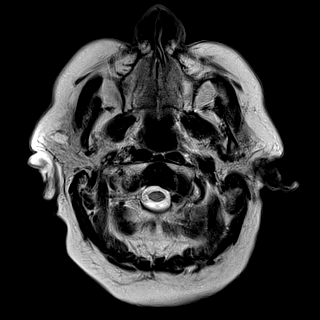
[im 25/25]
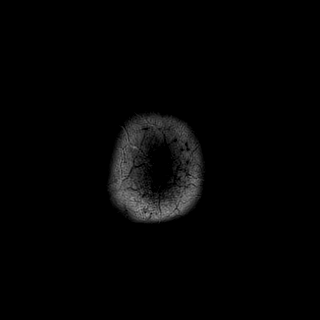

[Series 17: T2 · coronal · 5.0mm · 0.34mm/px · 2 of 29 slices shown (2 of 2)]
[im 1/29]
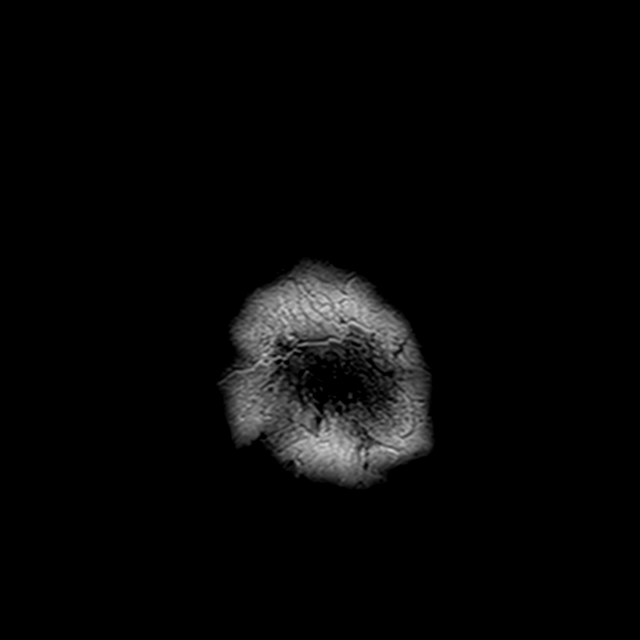
[im 29/29]
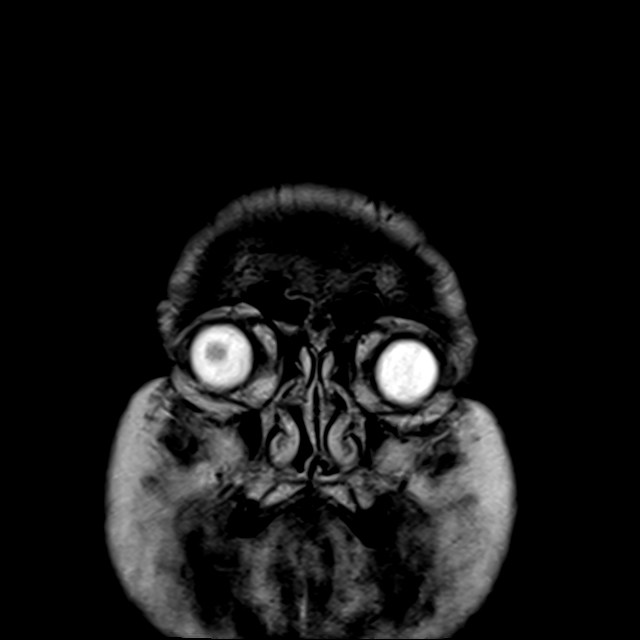

[44 of 48 positions shown; findings below may reference images not displayed]

FINDINGS: Brain: Diffuse prominence of the CSF containing spaces compatible
generalized age-related cerebral atrophy. Patchy T2/FLAIR
hyperintensity within the periventricular deep white matter both
cerebral hemispheres most consistent with chronic small vessel
ischemic disease, mild in nature. Few scattered superimposed remote
lacunar infarcts present about the hemispheric cerebral white
matter. Few scattered tiny remote left cerebellar infarcts noted as
well.

No abnormal foci of restricted diffusion to suggest acute or
subacute ischemia. Gray-white matter differentiation maintained. No
other areas of chronic cortical infarction. No foci of
susceptibility artifact to suggest acute or chronic intracranial
hemorrhage.

No mass lesion, midline shift or mass effect. Diffuse ventricular
prominence related to global parenchymal volume loss without
hydrocephalus. No extra-axial fluid collection. Midline structures
intact. Pituitary gland suprasellar region normal.

Vascular: Abnormal flow void within the left ICA to the cavernous
segment, consistent with known chronic left ICA occlusion.
Additional abnormal flow void seen within the right V4 segment,
consistent with severe atheromatous disease and probable occlusion,
also seen on prior CTA. Major intracranial vascular flow voids are
otherwise maintained.

Skull and upper cervical spine: Craniocervical junction within
normal limits. Bone marrow signal intensity normal. No scalp soft
tissue abnormality.

Sinuses/Orbits: Postoperative changes present about the left globe.
Globes and orbital soft tissues demonstrate no acute finding.
Paranasal sinuses are largely clear. No mastoid effusion. Inner ear
structures grossly normal.

Other: None.
IMPRESSION: 1. No acute intracranial abnormality.
2. Age-related cerebral atrophy with mild chronic small vessel
ischemic disease, with a few scattered remote lacunar infarcts
involving the hemispheric cerebral white matter and left cerebellum.
3. Abnormal flow voids within the left ICA to the cavernous segment,
consistent with known chronic left ICA occlusion. Additional
abnormal flow void within the right V4 segment, consistent with
severe atheromatous disease and probable occlusion, also seen on
prior CTA.

## 2020-10-23 IMAGING — CT CT ANGIO HEAD
3 of 13 series · 15 of 47 positions shown · IV contrast (OMNI 350)
Comparison: Prior head CT from earlier same day.

CLINICAL DATA: Initial evaluation for acute left-sided weakness,
dysphagia.

EXAM:
CT ANGIOGRAPHY HEAD AND NECK
TECHNIQUE: Multidetector CT imaging of the head and neck was performed using
the standard protocol during bolus administration of intravenous
contrast. Multiplanar CT image reconstructions and MIPs were
obtained to evaluate the vascular anatomy. Carotid stenosis
measurements (when applicable) are obtained utilizing NASCET
criteria, using the distal internal carotid diameter as the
denominator.
CONTRAST:  75mL OMNIPAQUE IOHEXOL 350 MG/ML SOLN

[Series 7: arterial thick cor · coronal · arterial · 0.60mm/px · 3 of 47 slices shown]
[im 12/47  brain]
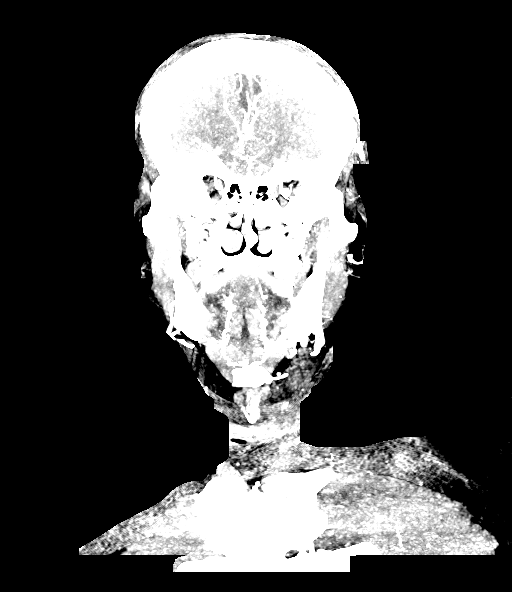
[im 24/47  brain]
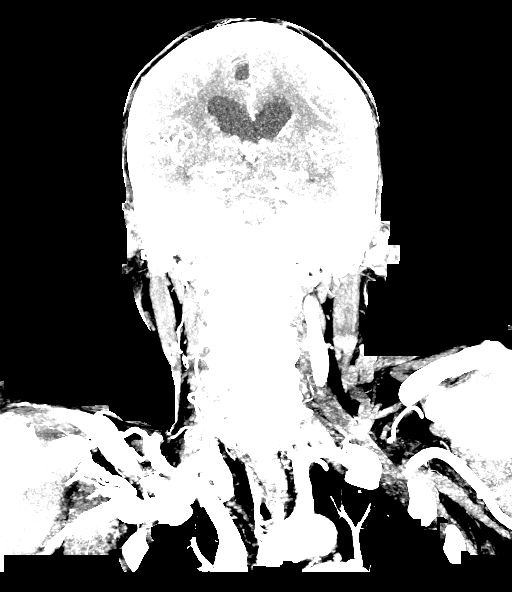
[im 35/47  brain]
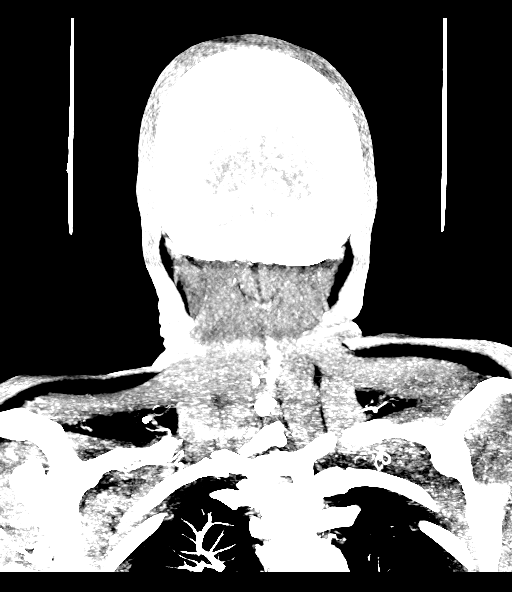

[Series 8: arterial thick sag · sagittal · arterial · 0.65mm/px · 1 of 41 slices shown]
[im 21/41  brain]
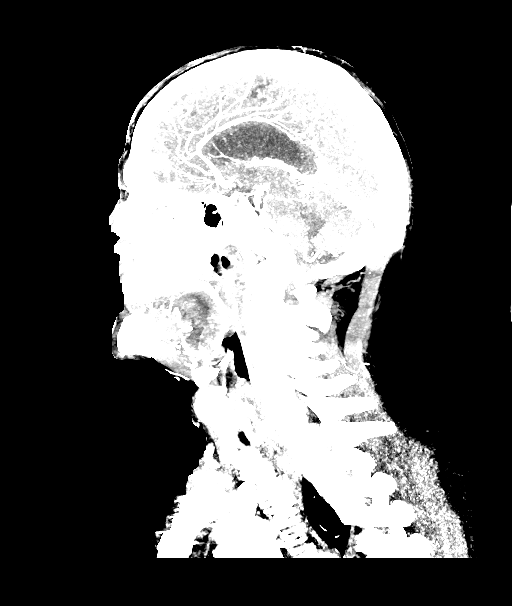

[Series 9: arterial thins · axial · arterial · 0.49mm/px · z∈[+965,+1234]mm · 11 of 323 slices shown]
[im 27/323  brain]
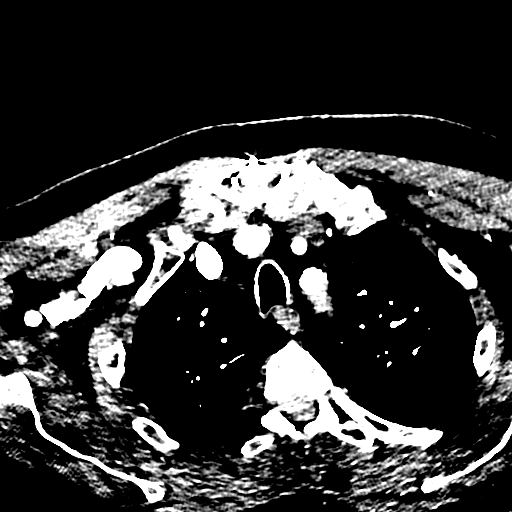
[im 54/323  bone]
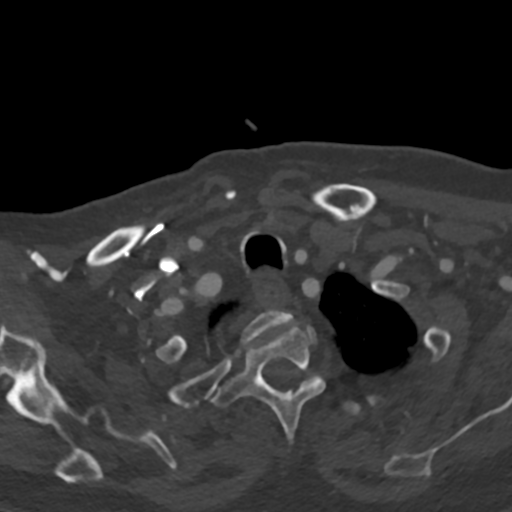
[im 81/323  brain]
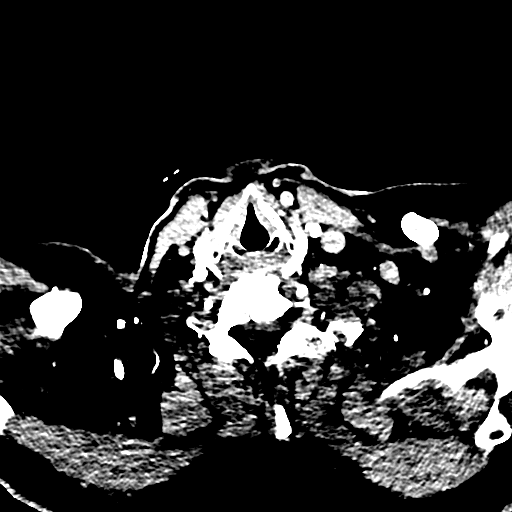
[im 108/323  bone]
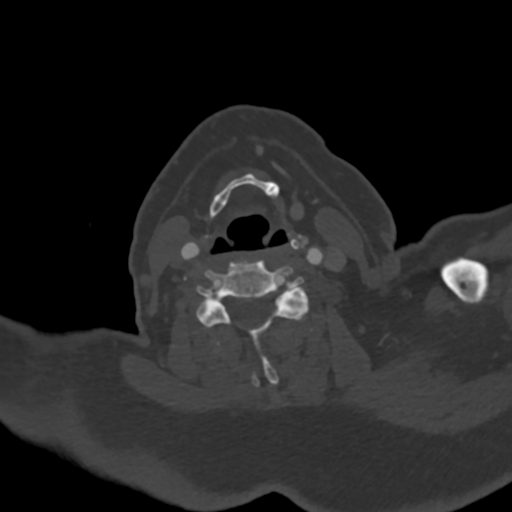
[im 135/323  brain]
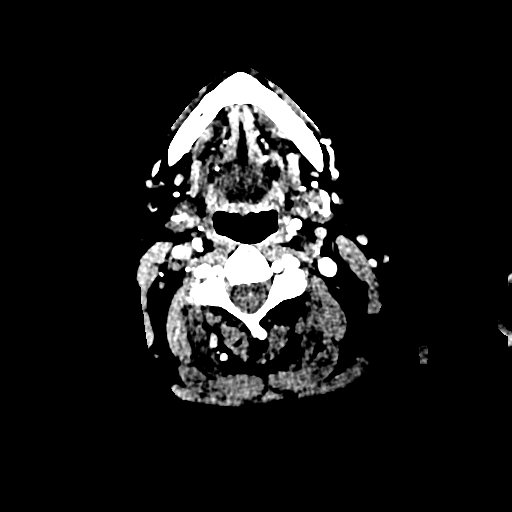
[im 162/323  bone]
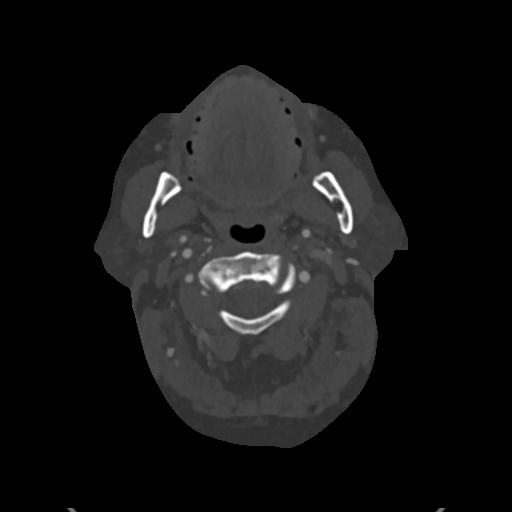
[im 188/323  brain]
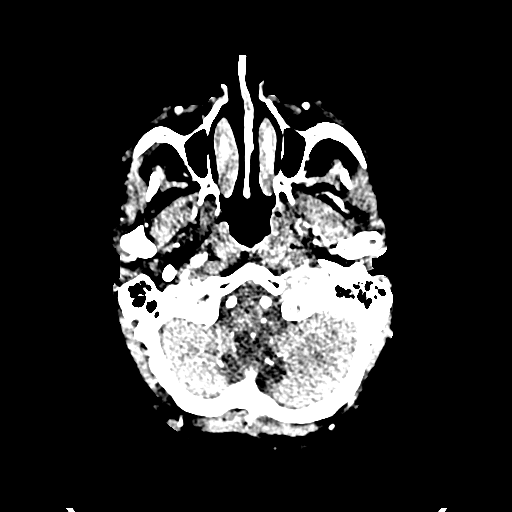
[im 215/323  bone]
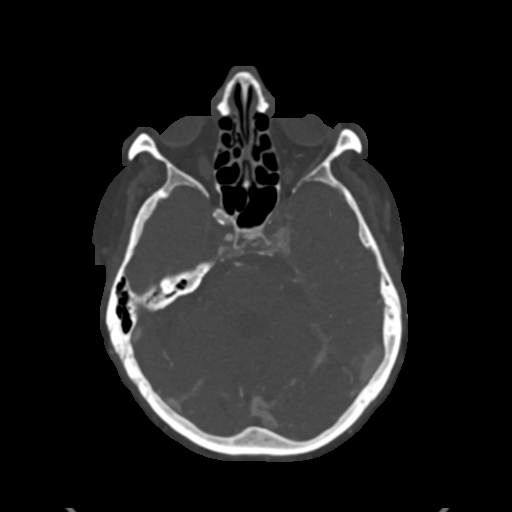
[im 242/323  brain]
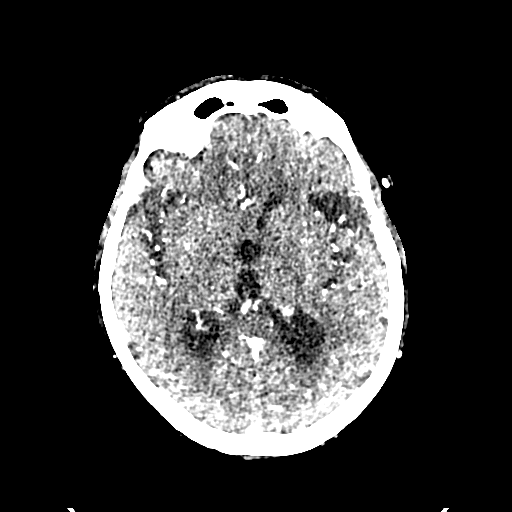
[im 269/323  bone]
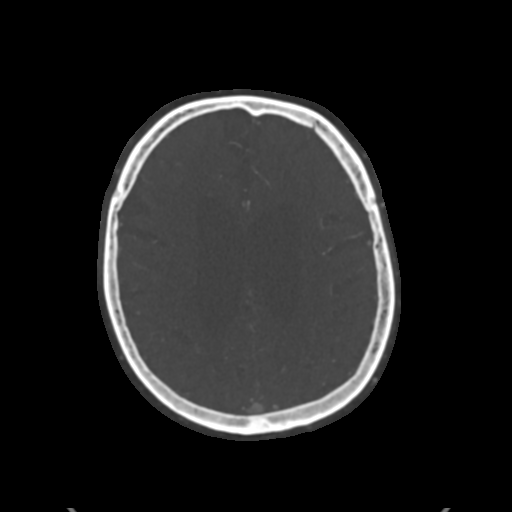
[im 296/323  brain]
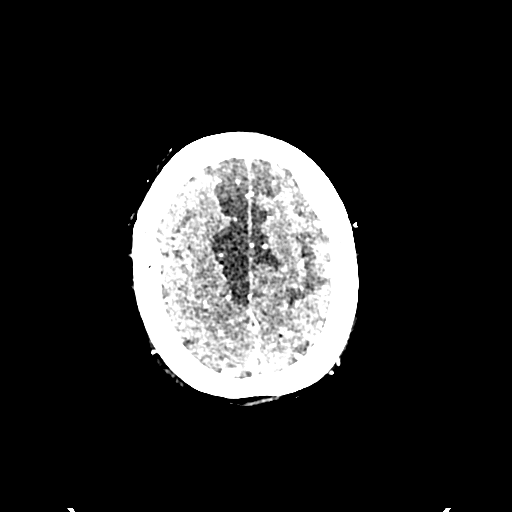

[15 of 47 positions shown; findings below may reference images not displayed]

FINDINGS: CTA NECK FINDINGS

Aortic arch: Visualized aortic arch of normal caliber with normal 3
vessel morphology. Moderate to advanced atheromatous change about
the aortic arch and origin of the great vessels. Associated 50%
atheromatous stenosis at the origin of the left CCA (series 9, image
304).

Right carotid system: Scattered atheromatous plaque throughout the
right CCA with no more than mild multifocal stenosis. Sequelae of
prior right carotid endarterectomy without residual stenosis about
the right carotid bifurcation or proximal right ICA. Right ICA
patent distally to the skull base without stenosis, dissection or
occlusion. Focal high-grade stenosis versus occlusion of the
proximal right ECA noted (series 9, image 198). Opacification of the
right ECA distally could be related to subocclusive stenosis and/or
collateralization.

Left carotid system: 50% atheromatous stenosis at the origin of the
left CCA. Additional scattered plaque noted within the left CCA
distally with no more than mild stenosis. Calcified plaque at the
left bifurcation with associated occlusion of the left ICA at its
origin. Left ICA remains occluded within the neck, stable from prior
MRA.

Vertebral arteries: Both vertebral arteries arise from the
subclavian arteries. No high-grade proximal subclavian artery
stenosis. Note is made of a 50% stenosis involving the mid left
subclavian artery (series 7, image 22). Atheromatous plaque at the
origin of the right vertebral artery with moderate to severe ostial
stenosis. Vertebral arteries otherwise patent within the neck
without high-grade stenosis, dissection or occlusion. Left vertebral
artery dominant.

Skeleton: No visible acute osseous abnormality. No discrete or
worrisome osseous lesions.

Other neck: No other acute soft tissue abnormality within the neck.
No mass lesion or adenopathy.

Upper chest: Visualized upper chest demonstrates no acute finding.
Paraseptal and centrilobular emphysematous changes noted within the
visualized lungs.

Review of the MIP images confirms the above findings

CTA HEAD FINDINGS

Anterior circulation: Petrous right ICA widely patent. Scattered
calcified plaque within the cavernous/supraclinoid right ICA.
Associated stenosis of up to approximately 50% at the supraclinoid
segment. On the left, the left ICA is occluded at the skull base.
Distal reconstitution at the cavernous segment the of
collateralization across the circle-of-Willis. Scattered
atheromatous plaque within the supraclinoid left ICA with associated
moderate to severe stenosis. ICA termini well perfused. A1 segments
patent bilaterally. Left A1 hypoplastic, accounting for the
diminutive left ICA is compared to the right. Normal anterior
communicating artery complex. Anterior cerebral arteries patent to
their distal aspects without stenosis. M1 segments patent
bilaterally without stenosis. Normal MCA bifurcations. Distal MCA
branches well perfused and symmetric.

Posterior circulation: Scattered atheromatous plaque within the
dominant left V4 segment with no more than mild multifocal stenosis.
Left PICA patent. Right V4 segment heavily diseased with associated
moderate to severe multifocal atheromatous stenoses. Right vertebral
artery essentially occludes prior to the vertebrobasilar junction.
Right PICA not well seen. Atheromatous irregularity seen involving
the proximal-mid basilar artery with associated stenosis of up to
approximately 50% at its mid aspect (series 7, image 23). Basilar
tip well perfused distally. Superior cerebral arteries patent
bilaterally. Both PCAs primarily supplied via the basilar and are
well perfused to their distal aspects.

Venous sinuses: Grossly patent allowing for timing the contrast
bolus.

Anatomic variants: None significant.  No aneurysm.

Review of the MIP images confirms the above findings
IMPRESSION: 1. Negative CTA for emergent large vessel occlusion.
2. Chronic occlusion of the left ICA at its origin, with distal
reconstitution at the cavernous segment.
3. Sequelae of prior right carotid endarterectomy with wide patency
of the right CCA and ICA. Severe stenosis versus occlusion involving
the proximal right ECA as above.
4. Moderate to severe ostial stenosis at the origin of the right
vertebral artery, with additional moderate to severe multifocal
right V4 stenoses. The right vertebral artery essentially occludes
prior to the vertebrobasilar junction. Dominant left vertebral
artery patent without significant stenosis.
5. 50% stenosis involving the mid basilar artery.
6. Aortic Atherosclerosis (2PHB8-1SF.F) and Emphysema (2PHB8-BTF.0).

These results were communicated to Dr. Nala at [DATE] pmon
03/11/2020by text page via the [employer] messaging system.

## 2020-10-25 ENCOUNTER — Telehealth: Payer: Self-pay | Admitting: *Deleted

## 2020-10-25 NOTE — Telephone Encounter (Signed)
Notified pt --Novofine plus 32 G needles (2 box) from [employer] pt assistant received and ready to be pick up at the front office.

## 2020-10-25 NOTE — Telephone Encounter (Signed)
LVM--Rx tresiba (4 box) from [employer] arrived to the office and ready to be pick-up at the  front office.

## 2020-10-28 NOTE — Telephone Encounter (Signed)
Pt's wife called- pt has enough to last him so they will be waiting till his appt on 4/25 to pick the medications up

## 2020-10-31 ENCOUNTER — Other Ambulatory Visit: Payer: Medicare Other

## 2020-10-31 ENCOUNTER — Other Ambulatory Visit: Payer: Self-pay

## 2020-10-31 DIAGNOSIS — I129 Hypertensive chronic kidney disease with stage 1 through stage 4 chronic kidney disease, or unspecified chronic kidney disease: Secondary | ICD-10-CM | POA: Diagnosis not present

## 2020-10-31 DIAGNOSIS — E559 Vitamin D deficiency, unspecified: Secondary | ICD-10-CM | POA: Diagnosis not present

## 2020-10-31 DIAGNOSIS — N189 Chronic kidney disease, unspecified: Secondary | ICD-10-CM | POA: Diagnosis not present

## 2020-10-31 DIAGNOSIS — E1122 Type 2 diabetes mellitus with diabetic chronic kidney disease: Secondary | ICD-10-CM | POA: Diagnosis not present

## 2020-10-31 DIAGNOSIS — D631 Anemia in chronic kidney disease: Secondary | ICD-10-CM | POA: Diagnosis not present

## 2020-10-31 DIAGNOSIS — I5032 Chronic diastolic (congestive) heart failure: Secondary | ICD-10-CM | POA: Diagnosis not present

## 2020-10-31 DIAGNOSIS — Z79899 Other long term (current) drug therapy: Secondary | ICD-10-CM | POA: Diagnosis not present

## 2020-11-07 DIAGNOSIS — E1122 Type 2 diabetes mellitus with diabetic chronic kidney disease: Secondary | ICD-10-CM | POA: Diagnosis not present

## 2020-11-07 DIAGNOSIS — I5032 Chronic diastolic (congestive) heart failure: Secondary | ICD-10-CM | POA: Diagnosis not present

## 2020-11-07 DIAGNOSIS — N189 Chronic kidney disease, unspecified: Secondary | ICD-10-CM | POA: Diagnosis not present

## 2020-11-07 DIAGNOSIS — E559 Vitamin D deficiency, unspecified: Secondary | ICD-10-CM | POA: Diagnosis not present

## 2020-11-07 DIAGNOSIS — R809 Proteinuria, unspecified: Secondary | ICD-10-CM | POA: Diagnosis not present

## 2020-11-07 DIAGNOSIS — D696 Thrombocytopenia, unspecified: Secondary | ICD-10-CM | POA: Diagnosis not present

## 2020-11-07 DIAGNOSIS — E1129 Type 2 diabetes mellitus with other diabetic kidney complication: Secondary | ICD-10-CM | POA: Diagnosis not present

## 2020-11-07 DIAGNOSIS — I129 Hypertensive chronic kidney disease with stage 1 through stage 4 chronic kidney disease, or unspecified chronic kidney disease: Secondary | ICD-10-CM | POA: Diagnosis not present

## 2020-11-11 ENCOUNTER — Other Ambulatory Visit: Payer: Self-pay

## 2020-11-11 ENCOUNTER — Ambulatory Visit: Payer: Medicare Other | Admitting: Endocrinology

## 2020-11-11 ENCOUNTER — Other Ambulatory Visit: Payer: Self-pay | Admitting: Family Medicine

## 2020-11-11 ENCOUNTER — Encounter: Payer: Self-pay | Admitting: Endocrinology

## 2020-11-11 VITALS — BP 118/64 | HR 80 | Ht 65.0 in | Wt 174.0 lb

## 2020-11-11 DIAGNOSIS — E1159 Type 2 diabetes mellitus with other circulatory complications: Secondary | ICD-10-CM

## 2020-11-11 DIAGNOSIS — E1169 Type 2 diabetes mellitus with other specified complication: Secondary | ICD-10-CM

## 2020-11-11 DIAGNOSIS — Z794 Long term (current) use of insulin: Secondary | ICD-10-CM

## 2020-11-11 DIAGNOSIS — I10 Essential (primary) hypertension: Secondary | ICD-10-CM

## 2020-11-11 DIAGNOSIS — E1165 Type 2 diabetes mellitus with hyperglycemia: Secondary | ICD-10-CM

## 2020-11-11 DIAGNOSIS — I152 Hypertension secondary to endocrine disorders: Secondary | ICD-10-CM

## 2020-11-11 LAB — POCT GLYCOSYLATED HEMOGLOBIN (HGB A1C): Hemoglobin A1C: 7.3 % — AB (ref 4.0–5.6)

## 2020-11-11 NOTE — Progress Notes (Signed)
Patient ID: Evan Stanley, male   DOB: 12-28-1940, 80 y.o.   MRN: [id number]          Reason for Appointment: Follow-up for Type 2 Diabetes    History of Present Illness:          Date of diagnosis of type 2 diabetes mellitus: 2010       Background history:   He thinks he was diagnosed to have diabetes after his coronary bypass surgery and blood sugars are not very high He had been on metformin until about 05/2017 Also at some point had taken glimepiride Usually appears to have had A1c just above 7%  Recent history:   INSULIN regimen is: [place]  48-50  units daily, Novolog 6 units at dinner     Non-insulin hypoglycemic drugs the patient is taking are: Trulicity 1.5 mg weekly  His A1c is gradually rising, now 7.3, previously was back down to 6.4  Current management, blood sugar patterns and problems identified:  He has overall higher readings after dinner  However it is unclear why he has some significantly high readings, highest 274 after dinner  He says he is not eating sweets or ice cream as much as before  However likely is eating high fat meals regularly  Again most of his evening blood sugars are after 6 PM likely after supper  FASTING readings are generally fairly well controlled clear why his A1c is relatively higher  He has periodic high readings over 200 after dinner  Some of his high sugars in the evenings are related to fried foods like fried chicken [place] fries, also sometimes eating ice cream  On an average blood sugar may be somewhat higher than on his last visit  He does not check his sugars after breakfast or lunch but sometimes will eat carbohydrates like waffles or sandwiches for those meals  He says he will eat waffles in the morning if blood sugars low normal  No hypoglycemic symptoms or any low sugars below 82  Appetite is about the same and weight has not changed much  Has been getting his Trulicity and insulin from patient assistance  programs        Side effects from medications have been: None  Glucose monitoring:  done about 2 times a day         Glucometer: One Touch.       Blood Glucose readings by review of home monitor download show   PRE-MEAL Fasting Lunch Dinner Bedtime Overall  Glucose range:       Mean/median:  126    151   POST-MEAL PC Breakfast PC Lunch PC Dinner  Glucose range:  190, 113  121-274  Mean/median:    172   Previously:   PRE-MEAL Fasting Lunch Dinner Bedtime Overall  Glucose range:  78-171     78-292  Median:  110     130+/-47   POST-MEAL PC Breakfast PC Lunch PC Dinner  Glucose range:    98-292  Mean/median:    158   Previously:  PRE-MEAL Fasting Lunch Dinner Bedtime Overall  Glucose range:  75-153      Mean/median:  109     127   POST-MEAL PC Breakfast PC Lunch PC Dinner  Glucose range:  84-211   89-209  Mean/median:  109   146    Self-care: The diet that the patient has been following is: tries to limit sweets.     Meal times are:  Breakfast is at  after 9 AM, dinner around 6 PM  Typical meal intake: Breakfast is eggs and meat and toast, sometimes waffles            Dietician visit, most recent: 2019                Weight history:  Wt Readings from Last 3 Encounters:  11/11/20 174 lb (78.9 kg)  09/20/20 172 lb (78 kg)  08/12/20 167 lb 3.2 oz (75.8 kg)    Glycemic control:   Lab Results  Component Value Date   HGBA1C 7.3 (A) 11/11/2020   HGBA1C 7.1 (A) 08/12/2020   HGBA1C 6.4 (A) 04/10/2020   Lab Results  Component Value Date   MICROALBUR 14.4 (H) 08/12/2020   LDLCALC 47 12/25/2019   CREATININE 1.71 (H) 08/12/2020   Lab Results  Component Value Date   MICRALBCREAT 13.5 08/12/2020    Lab Results  Component Value Date   FRUCTOSAMINE 274 08/12/2020   FRUCTOSAMINE 323 (H) 04/22/2018      Allergies as of 11/11/2020      Reactions   Baclofen Other (See Comments)   Stroke like symptoms   Morphine And Related Other (See Comments)   Delirium        Medication List       Accurate as of November 11, 2020 12:24 PM. If you have any questions, ask your nurse or doctor.        acetaminophen 325 MG tablet Commonly known as: TYLENOL Take 2 tablets (650 mg total) by mouth every 6 (six) hours as needed for mild pain (or Fever >/= 101).   atorvastatin 40 MG tablet Commonly known as: LIPITOR TAKE 1 TABLET BY MOUTH  DAILY   benzonatate 200 MG capsule Commonly known as: TESSALON Take 1 capsule (200 mg total) by mouth 3 (three) times daily as needed for cough.   cyanocobalamin 1000 MCG tablet Take 1,000 mcg by mouth daily.   Eliquis 5 MG Tabs tablet Generic drug: apixaban TAKE 1 TABLET BY MOUTH  TWICE DAILY   hydrochlorothiazide 25 MG tablet Commonly known as: HYDRODIURIL TAKE 1 TABLET BY MOUTH  DAILY   latanoprost 0.005 % ophthalmic solution Commonly known as: XALATAN Place 1 drop into the left eye at bedtime.   lisinopril 20 MG tablet Commonly known as: ZESTRIL TAKE 1 TABLET BY MOUTH  DAILY   metoprolol succinate 100 MG 24 hr tablet Commonly known as: TOPROL-XL TAKE 1 TABLET BY MOUTH  DAILY WITH OR IMMEDIATELY  FOLLOWING A MEAL   NovoLOG FlexPen 100 UNIT/ML FlexPen Generic drug: insulin aspart Inject 6 Units into the skin daily. With supper if blood sugar is greater than 150   OneTouch Ultra test strip Generic drug: glucose blood   PRO-STAT 64 PO Take 30 mLs by mouth 2 (two) times daily.   [name] FlexTouch 200 UNIT/ML FlexTouch Pen Generic drug: insulin degludec Inject 50 Units into the skin daily.   Trulicity 1.5 ZS/0.1UX Sopn Generic drug: Dulaglutide Inject 1.5 mg into the skin once a week. Inject the contents of one pen once per week   UNABLE TO FIND Diet: NAS and consistent carbohydrate   vitamin C 500 MG tablet Commonly known as: ASCORBIC ACID Take 500 mg by mouth daily.   vitamin E 1000 UNIT capsule Take 1,000 Units by mouth daily.       Allergies:  Allergies  Allergen Reactions  .  Baclofen Other (See Comments)    Stroke like symptoms  . Morphine And Related Other (See Comments)  Delirium     Past Medical History:  Diagnosis Date  . AKI (acute kidney injury) ([place])   . Allergy   . Anxiety   . Arthritis    Right shoulder  . Atrial fibrillation ([place])   . Carotid artery stenosis   . Cataract   . CHF (congestive heart failure) ([place])   . CVA (cerebral vascular accident) ([place]) 04/28/2016  . Decreased vision    left eye  . Diabetes mellitus without complication ([place])    Takes Metformin  . Hyperlipidemia   . Hypertension   . Salmonella bacteremia 04/29/2016  . Sepsis ([place]) 04/29/2016  . Stroke ([place]) 11/18/2015   no deficits  . TIA (transient ischemic attack)    affected left eye  . Urgency of urination     Past Surgical History:  Procedure Laterality Date  . AORTIC VALVE REPLACEMENT    . BACK SURGERY  80s  . CARDIAC VALVE REPLACEMENT  11/01/2008   21-mm Edwards Pericardial Magna - Ease valve  . ENDARTERECTOMY Right 01/13/2016   Procedure: ENDARTERECTOMY CAROTID - RIGHT;  Surgeon: Conrad [place], MD;  Location: [facility];  Service: Vascular;  Laterality: Right;  . EYE SURGERY    . laser surgery, left eye  Left 10-29-2015    retinal surgery/ Deloria Lair MD   . PERIPHERAL VASCULAR CATHETERIZATION Right 11/18/2015   Procedure: Carotid Angiography;  Surgeon: Conrad [place], MD;  Location: [facility];  Service: Cardiovascular;  Laterality: Right;  . PERIPHERAL VASCULAR CATHETERIZATION N/A 11/18/2015   Procedure: Aortic Arch Angiography;  Surgeon: Conrad [place], MD;  Location: [facility];  Service: Cardiovascular;  Laterality: N/A;  . TEE WITHOUT CARDIOVERSION N/A 05/05/2016   Procedure: TRANSESOPHAGEAL ECHOCARDIOGRAM (TEE);  Surgeon: Lelon [place]la, MD;  Location: [facility];  Service: Cardiovascular;  Laterality: N/A;    Family History  Problem Relation Age of Onset  . Heart disease Mother   . Cancer Mother        breast  . Stroke Father 48  .  Hypertension Father   . Early death Sister        infant death  . Heart disease Brother   . Hydrocephalus Brother   . Heart disease Brother   . Diabetes Brother   . Cancer Brother        prostat  . Healthy Son   . Healthy Son     Social History:  reports that he quit smoking about 34 years ago. His smoking use included cigarettes. He has a 40.00 pack-year smoking history. He has never used smokeless tobacco. He reports that he does not drink alcohol and does not use drugs.   Review of Systems   Lipid history: Lipids controlled well on atorvastatin 40 mg daily prescribed by his PCP   Has history of CAD and CVD    Lab Results  Component Value Date   CHOL 89 12/25/2019   HDL 30 (L) 12/25/2019   LDLCALC 47 12/25/2019   LDLDIRECT 62 02/16/2017   TRIG 60 12/25/2019   CHOLHDL 3.0 12/25/2019           Hypertension:  His treatment includes hydrochlorothiazide 25 mg and lisinopril 20 mg daily,   prescribed by PCP Also monitors at home 118/74  BP Readings from Last 3 Encounters:  11/11/20 118/64  09/20/20 116/60  08/12/20 126/72     CKD: Creatinine variable as follows Recently seen by nephrologist and creatinine is still about 1.7  Lab Results  Component  Value Date   CREATININE 1.71 (H) 08/12/2020   CREATININE 1.68 (H) 05/29/2020   CREATININE 1.47 (H) 05/13/2020         Physical Examination:  BP 118/64   Pulse 80   Ht 5' 5"  (1.651 m)   Wt 174 lb (78.9 kg)   SpO2 98%   BMI 28.96 kg/m       ASSESSMENT:  Diabetes type 2, on insulin   See history of present illness for detailed discussion of current diabetes management, blood sugar patterns and problems identified  His A1c is somewhat higher at 7.3, has been below as much as 6 % frequently and possibly affected by his renal dysfunction  Not clear if he is having high readings after breakfast and lunch since he does not monitor these Blood sugars after dinner are on an average fairly good with sporadic  increase based on his diet He also forgets to increase his suppertime dose when he is eating higher glycemic index foods such as fried foods or ice cream  He is benefiting from Trulicity also Fasting blood sugars are near normal with lowest reading in the 70s  HYPERTENSION: Well-controlled but blood pressure may be low normal and he needs to check it including standing when he has any dizzy symptoms  Renal insufficiency: Continue follow-up with nephrologist   PLAN:    He will continue his [name] to 48 units daily  Trial of 3 mg Trulicity using double the doses of his prescription at home  He needs to modify his diet and avoid high-fat meals, given list of low saturated fat foods to use as a guideline  To talk to the dietitian for meal planning, and needs advice for management of his diabetes, hyperlipidemia, renal insufficiency and mild weight loss  If he is eating any higher fat meals or desserts especially at dinnertime he will need to take up to 10 units of NovoLog instead of 6 units  Also some sugar need to be checked after breakfast or lunch  He will call his PCP or nephrologist if his blood pressure reading consistently lower   Patient Instructions  Check blood sugars on waking up 3-4 days a week  Also check blood sugars about 2 hours after meals and do this after different meals by rotation  Recommended blood sugar levels on waking up are 90-130 and about 2 hours after meal is 130-160  Please bring your blood sugar monitor to each visit, thank you  Trulicity 35m weekly   Novolog upto 10 units for hi fat meals      Haniya Fern 11/11/2020, 12:24 PM   Note: This office note was prepared with Dragon voice recognition system technology. Any transcriptional errors that result from this process are unintentional.

## 2020-11-11 NOTE — Progress Notes (Signed)
Pt pick-up tresiba flextouch 100 units/ML 4 (box) from [employer].

## 2020-11-11 NOTE — Patient Instructions (Addendum)
Check blood sugars on waking up 3-4 days a week  Also check blood sugars about 2 hours after meals and do this after different meals by rotation  Recommended blood sugar levels on waking up are 90-130 and about 2 hours after meal is 130-160  Please bring your blood sugar monitor to each visit, thank you  Trulicity 36m weekly   Novolog upto 10 units for hi fat meals

## 2020-11-21 NOTE — Telephone Encounter (Signed)
Called and left a detailed message advising pt assistance medication (Novolog 1 box) has been delivered and is ready for pick up.

## 2020-11-27 ENCOUNTER — Ambulatory Visit (INDEPENDENT_AMBULATORY_CARE_PROVIDER_SITE_OTHER): Payer: Medicare Other | Admitting: Ophthalmology

## 2020-11-27 ENCOUNTER — Encounter (INDEPENDENT_AMBULATORY_CARE_PROVIDER_SITE_OTHER): Payer: Self-pay | Admitting: Ophthalmology

## 2020-11-27 ENCOUNTER — Other Ambulatory Visit: Payer: Self-pay

## 2020-11-27 DIAGNOSIS — H43811 Vitreous degeneration, right eye: Secondary | ICD-10-CM

## 2020-11-27 DIAGNOSIS — H34232 Retinal artery branch occlusion, left eye: Secondary | ICD-10-CM | POA: Diagnosis not present

## 2020-11-27 DIAGNOSIS — H4052X3 Glaucoma secondary to other eye disorders, left eye, severe stage: Secondary | ICD-10-CM | POA: Diagnosis not present

## 2020-11-27 DIAGNOSIS — H211X2 Other vascular disorders of iris and ciliary body, left eye: Secondary | ICD-10-CM | POA: Diagnosis not present

## 2020-11-27 MED ORDER — ATROPINE SULFATE 1 % OP SOLN: 1.0000 [drp] | Freq: Two times a day (BID) | OPHTHALMIC | 11 refills | Status: AC

## 2020-11-27 NOTE — Assessment & Plan Note (Signed)
Old stable, secondary neovascular glaucoma stabilized with profound visual loss and goal is now patient comfort

## 2020-11-27 NOTE — Assessment & Plan Note (Signed)
Goal is now patient comfort, using topical latanoprost for IOP control and topical atropine twice daily as needed discomfort and pain

## 2020-11-27 NOTE — Progress Notes (Signed)
11/27/2020     CHIEF COMPLAINT Patient presents for Retina Follow Up (Pt states, "I feel like my va is about the same. I am not having any issues. I have a kidney doctor now."/A1C: 7.0/LBS: 123/Pt reports taking Latanoprost QHS OU)   HISTORY OF PRESENT ILLNESS: Evan Stanley is a 80 y.o. male who presents to the clinic today for:   HPI    Retina Follow Up    Diagnosis: Other   Laterality: both eyes   Onset: 1 year ago   Severity: mild   Duration: 1 year   Course: stable   Comments: Pt states, "I feel like my va is about the same. I am not having any issues. I have a kidney doctor now." A1C: 7.0 LBS: 123 Pt reports taking Latanoprost QHS OU          Comments    1 year fu ou with fp Pt states, "I feel like my va is about the same. I am not having any issues. I have a kidney doctor now." A1C: 7.0 LBS: 123 Pt reports taking Latanoprost QHS OU       Last edited by Kendra Opitz, COA on 11/27/2020 10:02 AM. (History)      Referring physician: [name], DO [place],  [place] [address]  HISTORICAL INFORMATION:   Selected notes from the MEDICAL RECORD NUMBER    Lab Results  Component Value Date   HGBA1C 7.3 (A) 11/11/2020     CURRENT MEDICATIONS: Current Outpatient Medications (Ophthalmic Drugs)  Medication Sig  . atropine 1 % ophthalmic solution Place 1 drop into the left eye 2 (two) times daily.  Marland Kitchen latanoprost (XALATAN) 0.005 % ophthalmic solution Place 1 drop into the left eye at bedtime.    No current facility-administered medications for this visit. (Ophthalmic Drugs)   Current Outpatient Medications (Other)  Medication Sig  . acetaminophen (TYLENOL) 325 MG tablet Take 2 tablets (650 mg total) by mouth every 6 (six) hours as needed for mild pain (or Fever >/= 101).  . Amino Acids-Protein Hydrolys (PRO-STAT 64 PO) Take 30 mLs by mouth 2 (two) times daily.  Marland Kitchen atorvastatin (LIPITOR) 40 MG tablet TAKE 1 TABLET BY MOUTH  DAILY  .  benzonatate (TESSALON) 200 MG capsule Take 1 capsule (200 mg total) by mouth 3 (three) times daily as needed for cough.  . cyanocobalamin 1000 MCG tablet Take 1,000 mcg by mouth daily.  . Dulaglutide (TRULICITY) 1.5 GY/1.7CB SOPN Inject 1.5 mg into the skin once a week. Inject the contents of one pen once per week  . ELIQUIS 5 MG TABS tablet TAKE 1 TABLET BY MOUTH  TWICE DAILY  . hydrochlorothiazide (HYDRODIURIL) 25 MG tablet TAKE 1 TABLET BY MOUTH  DAILY  . insulin aspart (NOVOLOG FLEXPEN) 100 UNIT/ML FlexPen Inject 6 Units into the skin daily. With supper if blood sugar is greater than 150  . insulin degludec (TRESIBA FLEXTOUCH) 200 UNIT/ML FlexTouch Pen Inject 50 Units into the skin daily.  Marland Kitchen lisinopril (ZESTRIL) 20 MG tablet TAKE 1 TABLET BY MOUTH  DAILY  . metoprolol succinate (TOPROL-XL) 100 MG 24 hr tablet TAKE 1 TABLET BY MOUTH  DAILY WITH OR IMMEDIATELY  FOLLOWING A MEAL  . ONETOUCH ULTRA test strip   . UNABLE TO FIND Diet: NAS and consistent carbohydrate  . vitamin C (ASCORBIC ACID) 500 MG tablet Take 500 mg by mouth daily.  . vitamin E 1000 UNIT capsule Take 1,000 Units by mouth daily.  No current facility-administered medications for this visit. (Other)      REVIEW OF SYSTEMS:    ALLERGIES Allergies  Allergen Reactions  . Baclofen Other (See Comments)    Stroke like symptoms  . Morphine And Related Other (See Comments)    Delirium     PAST MEDICAL HISTORY Past Medical History:  Diagnosis Date  . AKI (acute kidney injury) ([place])   . Allergy   . Anxiety   . Arthritis    Right shoulder  . Atrial fibrillation ([place])   . Carotid artery stenosis   . Cataract   . CHF (congestive heart failure) ([place])   . CVA (cerebral vascular accident) ([place]) 04/28/2016  . Decreased vision    left eye  . Diabetes mellitus without complication ([place])    Takes Metformin  . Hyperlipidemia   . Hypertension   . Salmonella bacteremia 04/29/2016  . Sepsis ([place]) 04/29/2016  . Stroke  ([place]) 11/18/2015   no deficits  . TIA (transient ischemic attack)    affected left eye  . Urgency of urination    Past Surgical History:  Procedure Laterality Date  . AORTIC VALVE REPLACEMENT    . BACK SURGERY  80s  . CARDIAC VALVE REPLACEMENT  11/01/2008   21-mm Edwards Pericardial Magna - Ease valve  . ENDARTERECTOMY Right 01/13/2016   Procedure: ENDARTERECTOMY CAROTID - RIGHT;  Surgeon: Conrad Kelliher, MD;  Location: [facility];  Service: Vascular;  Laterality: Right;  . EYE SURGERY    . laser surgery, left eye  Left 10-29-2015    retinal surgery/ Deloria Lair MD   . PERIPHERAL VASCULAR CATHETERIZATION Right 11/18/2015   Procedure: Carotid Angiography;  Surgeon: Conrad Grafton, MD;  Location: [facility];  Service: Cardiovascular;  Laterality: Right;  . PERIPHERAL VASCULAR CATHETERIZATION N/A 11/18/2015   Procedure: Aortic Arch Angiography;  Surgeon: Conrad Banks, MD;  Location: [facility];  Service: Cardiovascular;  Laterality: N/A;  . TEE WITHOUT CARDIOVERSION N/A 05/05/2016   Procedure: TRANSESOPHAGEAL ECHOCARDIOGRAM (TEE);  Surgeon: Lelon Perla, MD;  Location: [facility] ENDOSCOPY;  Service: Cardiovascular;  Laterality: N/A;    FAMILY HISTORY Family History  Problem Relation Age of Onset  . Heart disease Mother   . Cancer Mother        breast  . Stroke Father 47  . Hypertension Father   . Early death Sister        infant death  . Heart disease Brother   . Hydrocephalus Brother   . Heart disease Brother   . Diabetes Brother   . Cancer Brother        prostat  . Healthy Son   . Healthy Son     SOCIAL HISTORY Social History   Tobacco Use  . Smoking status: Former Smoker    Packs/day: 2.00    Years: 20.00    Pack years: 40.00    Types: Cigarettes    Quit date: 07/20/1986    Years since quitting: 34.3  . Smokeless tobacco: Never Used  Vaping Use  . Vaping Use: Never used  Substance Use Topics  . Alcohol use: No    Alcohol/week: 0.0 standard drinks  . Drug use:  No         OPHTHALMIC EXAM: Base Eye Exam    Visual Acuity (ETDRS)      Right Left   Dist cc 20/25 +2 HM   Correction: Glasses       Tonometry (Tonopen, 10:06 AM)  Right Left   Pressure 12 11       Pupils      Pupils Dark Light Shape React APD   Right PERRL 4 3 Round Brisk None   Left PERRL 6 6 Irregular Minimal None       Visual Fields      Left Right     Full   Restrictions Total superior temporal, inferior temporal, superior nasal, inferior nasal deficiencies        Neuro/Psych    Oriented x3: Yes   Mood/Affect: Normal       Dilation    Both eyes: 1.0% Mydriacyl, 2.5% Phenylephrine @ 10:06 AM        Slit Lamp and Fundus Exam    External Exam      Right Left   External Normal Normal       Slit Lamp Exam      Right Left   Lids/Lashes Normal Normal   Conjunctiva/Sclera White and quiet White and quiet   Cornea Clear Opacity: Central, 1+ Edema   Anterior Chamber Deep and quiet Tube at 130-2 meridian, with high PAS   Iris Round and reactive    Lens Posterior chamber intraocular lens Posterior chamber intraocular lens   Anterior Vitreous Normal Normal       Fundus Exam      Right Left   Posterior Vitreous Posterior vitreous detachment Posterior vitreous detachment   Disc Normal 2+ Pallor   C/D Ratio 0.35 0.85   Macula Normal A attached cloudy details   Vessels Normal Old central retinal artery occlusion   Periphery Normal Good PRP peripherally          IMAGING AND PROCEDURES  Imaging and Procedures for 11/27/20  Color Fundus Photography Optos - OU - Both Eyes       Right Eye Progression has been stable. Disc findings include normal observations. Macula : normal observations. Periphery : normal observations.   Left Eye Progression has been stable. Disc findings include increased cup to disc ratio, pallor. Vessels : attenuated.   Notes OS, good PRP room temporally.  Cloudy media OS old central retinal artery occlusion OS with optic  pallor  OD entirely normal                ASSESSMENT/PLAN:  Branch retinal artery occlusion of left eye Old stable, secondary neovascular glaucoma stabilized with profound visual loss and goal is now patient comfort  Glaucoma associated with ocular disorder, left, severe stage Goal is now patient comfort, using topical latanoprost for IOP control and topical atropine twice daily as needed discomfort and pain  Rubeosis iridis of left eye Stable overall      ICD-10-CM   1. Posterior vitreous detachment of right eye  H43.811 Color Fundus Photography Optos - OU - Both Eyes  2. Branch retinal artery occlusion of left eye  H34.232   3. Glaucoma associated with ocular disorder, left, severe stage  H40.52X3   4. Rubeosis iridis of left eye  H21.1X2     1.  OD normal, no Rx required  2.  OS, history of BRVO with secondary neovascular, and proliferative disease status post PRP and stabilization.  Hand motion vision goal is patient comfort and pain control  3.  Refill topical atropine 1% left eye 1 drop twice daily as needed  Ophthalmic Meds Ordered this visit:  Meds ordered this encounter  Medications  . atropine 1 % ophthalmic solution    Sig: Place 1 drop into the  left eye 2 (two) times daily.    Dispense:  3 mL    Refill:  11       Return in about 1 year (around 11/27/2021) for COLOR FP, DILATE OU.  There are no Patient Instructions on file for this visit.   Explained the diagnoses, plan, and follow up with the patient and they expressed understanding.  Patient expressed understanding of the importance of proper follow up care.   Clent Demark Tomothy Eddins M.D. Diseases & Surgery of the Retina and Vitreous [facility] [place] 11/27/20     Abbreviations: M myopia (nearsighted); A astigmatism; H hyperopia (farsighted); P presbyopia; Mrx spectacle prescription;  CTL contact lenses; OD right eye; OS left eye; OU both eyes  XT exotropia; ET esotropia; PEK punctate  epithelial keratitis; PEE punctate epithelial erosions; DES dry eye syndrome; MGD meibomian gland dysfunction; ATs artificial tears; PFAT's preservative free artificial tears; Page nuclear sclerotic cataract; PSC posterior subcapsular cataract; ERM epi-retinal membrane; PVD posterior vitreous detachment; RD retinal detachment; DM diabetes mellitus; DR diabetic retinopathy; NPDR non-proliferative diabetic retinopathy; PDR proliferative diabetic retinopathy; CSME clinically significant macular edema; DME diabetic macular edema; dbh dot blot hemorrhages; CWS cotton wool spot; POAG primary open angle glaucoma; C/D cup-to-disc ratio; HVF humphrey visual field; GVF goldmann visual field; OCT optical coherence tomography; IOP intraocular pressure; BRVO Branch retinal vein occlusion; CRVO central retinal vein occlusion; CRAO central retinal artery occlusion; BRAO branch retinal artery occlusion; RT retinal tear; SB scleral buckle; PPV pars plana vitrectomy; VH Vitreous hemorrhage; PRP panretinal laser photocoagulation; IVK intravitreal kenalog; VMT vitreomacular traction; MH Macular hole;  NVD neovascularization of the disc; NVE neovascularization elsewhere; AREDS age related eye disease study; ARMD age related macular degeneration; POAG primary open angle glaucoma; EBMD epithelial/anterior basement membrane dystrophy; ACIOL anterior chamber intraocular lens; IOL intraocular lens; PCIOL posterior chamber intraocular lens; Phaco/IOL phacoemulsification with intraocular lens placement; Kennedy photorefractive keratectomy; LASIK laser assisted in situ keratomileusis; HTN hypertension; DM diabetes mellitus; COPD chronic obstructive pulmonary disease

## 2020-11-27 NOTE — Assessment & Plan Note (Signed)
Stable overall

## 2020-11-28 ENCOUNTER — Encounter (INDEPENDENT_AMBULATORY_CARE_PROVIDER_SITE_OTHER): Payer: Medicare Other | Admitting: Ophthalmology

## 2020-12-05 DIAGNOSIS — E1142 Type 2 diabetes mellitus with diabetic polyneuropathy: Secondary | ICD-10-CM | POA: Diagnosis not present

## 2020-12-05 DIAGNOSIS — L84 Corns and callosities: Secondary | ICD-10-CM | POA: Diagnosis not present

## 2020-12-05 DIAGNOSIS — B351 Tinea unguium: Secondary | ICD-10-CM | POA: Diagnosis not present

## 2020-12-05 DIAGNOSIS — M79676 Pain in unspecified toe(s): Secondary | ICD-10-CM | POA: Diagnosis not present

## 2020-12-08 NOTE — Progress Notes (Signed)
Cardiology Office Note:    Date:  12/09/2020   ID:  Evan Stanley, DOB 1941-05-02, MRN [id number]  PCP:  Evan Norlander, DO  Cardiologist:  Evan Grooms, MD   Referring MD: Evan Norlander, DO   Chief Complaint  Patient presents with  . Coronary Artery Disease  . Cardiac Valve Problem    History of Present Illness:    Evan Stanley is a 80 y.o. male with a hx of bioprosthetic aortic valve2010,hyperlipidemia,coronary artery disease with prior bypass grafting2010, left ventricular systolic dysfunction, right cerebral CVA (2017), total occlusion left ICA,RCEA,and DM type II.  Evan Stanley is becoming increasingly and firm.  He does not see out of his left eye.  He walks with a cane.  He can no longer play golf.  He does not drive his car.  He did play 6 holes of golf yesterday.  That did not lift his spirits.  Not having chest pain.  His wife is with him.  Not needed nitroglycerin.  Denies orthopnea and PND.  There is no peripheral edema.  Past Medical History:  Diagnosis Date  . AKI (acute kidney injury) ([place])   . Allergy   . Anxiety   . Arthritis    Right shoulder  . Atrial fibrillation ([place])   . Carotid artery stenosis   . Cataract   . CHF (congestive heart failure) ([place])   . CVA (cerebral vascular accident) ([place]) 04/28/2016  . Decreased vision    left eye  . Diabetes mellitus without complication ([place])    Takes Metformin  . Hyperlipidemia   . Hypertension   . Salmonella bacteremia 04/29/2016  . Sepsis ([place]) 04/29/2016  . Stroke ([place]) 11/18/2015   no deficits  . TIA (transient ischemic attack)    affected left eye  . Urgency of urination     Past Surgical History:  Procedure Laterality Date  . AORTIC VALVE REPLACEMENT    . BACK SURGERY  80s  . CARDIAC VALVE REPLACEMENT  11/01/2008   21-mm Edwards Pericardial Magna - Ease valve  . ENDARTERECTOMY Right 01/13/2016   Procedure: ENDARTERECTOMY CAROTID - RIGHT;  Surgeon: Evan [place], MD;   Location: [facility];  Service: Vascular;  Laterality: Right;  . EYE SURGERY    . laser surgery, left eye  Left 10-29-2015    retinal surgery/ Evan Lair MD   . PERIPHERAL VASCULAR CATHETERIZATION Right 11/18/2015   Procedure: Carotid Angiography;  Surgeon: Evan [place], MD;  Location: [facility];  Service: Cardiovascular;  Laterality: Right;  . PERIPHERAL VASCULAR CATHETERIZATION N/A 11/18/2015   Procedure: Aortic Arch Angiography;  Surgeon: Evan [place], MD;  Location: [facility];  Service: Cardiovascular;  Laterality: N/A;  . TEE WITHOUT CARDIOVERSION N/A 05/05/2016   Procedure: TRANSESOPHAGEAL ECHOCARDIOGRAM (TEE);  Surgeon: Evan Perla, MD;  Location: [facility] ENDOSCOPY;  Service: Cardiovascular;  Laterality: N/A;    Current Medications: Current Meds  Medication Sig  . acetaminophen (TYLENOL) 325 MG tablet Take 2 tablets (650 mg total) by mouth every 6 (six) hours as needed for mild pain (or Fever >/= 101).  Marland Kitchen atorvastatin (LIPITOR) 40 MG tablet TAKE 1 TABLET BY MOUTH  DAILY  . atropine 1 % ophthalmic solution Place 1 drop into the left eye 2 (two) times daily.  . cetirizine (ZYRTEC) 10 MG tablet Take 1 tablet by mouth as needed for allergies.  . Cholecalciferol 25 MCG (1000 UT) capsule Take 1 capsule by mouth daily.  Marland Kitchen  cyanocobalamin 1000 MCG tablet Take 1,000 mcg by mouth daily.  . Dulaglutide (TRULICITY) 1.5 YH/0.6CB SOPN Inject 1.5 mg into the skin once a week. Inject the contents of one pen once per week  . ELIQUIS 5 MG TABS tablet TAKE 1 TABLET BY MOUTH  TWICE DAILY  . hydrochlorothiazide (HYDRODIURIL) 25 MG tablet TAKE 1 TABLET BY MOUTH  DAILY  . insulin aspart (NOVOLOG FLEXPEN) 100 UNIT/ML FlexPen Inject 6 Units into the skin daily. With supper if blood sugar is greater than 150  . insulin degludec (TRESIBA FLEXTOUCH) 200 UNIT/ML FlexTouch Pen Inject 50 Units into the skin daily.  Marland Kitchen latanoprost (XALATAN) 0.005 % ophthalmic solution Place 1 drop into the left eye at  bedtime.   Marland Kitchen lisinopril (ZESTRIL) 20 MG tablet TAKE 1 TABLET BY MOUTH  DAILY  . metoprolol succinate (TOPROL-XL) 100 MG 24 hr tablet TAKE 1 TABLET BY MOUTH  DAILY WITH OR IMMEDIATELY  FOLLOWING A MEAL  . ONETOUCH ULTRA test strip   . UNABLE TO FIND Diet: NAS and consistent carbohydrate  . vitamin C (ASCORBIC ACID) 500 MG tablet Take 500 mg by mouth daily.  . vitamin E 1000 UNIT capsule Take 1,000 Units by mouth daily.     Allergies:   Baclofen and Morphine and related   Social History   Socioeconomic History  . Marital status: Married    Spouse name: Not on file  . Number of children: 2  . Years of education: 64  . Highest education level: 11th grade  Occupational History  . Occupation: maintenance    Employer: [employer]    Comment: Retired  Tobacco Use  . Smoking status: Former Smoker    Packs/day: 2.00    Years: 20.00    Pack years: 40.00    Types: Cigarettes    Quit date: 07/20/1986    Years since quitting: 34.4  . Smokeless tobacco: Never Used  Vaping Use  . Vaping Use: Never used  Substance and Sexual Activity  . Alcohol use: No    Alcohol/week: 0.0 standard drinks  . Drug use: No  . Sexual activity: Yes  Other Topics Concern  . Not on file  Social History Narrative  . Not on file   Social Determinants of Health   Financial Resource Strain: Not on file  Food Insecurity: Not on file  Transportation Needs: Not on file  Physical Activity: Not on file  Stress: Not on file  Social Connections: Not on file     Family History: The patient's family history includes Cancer in his brother and mother; Diabetes in his brother; Early death in his sister; Healthy in his son and son; Heart disease in his brother, brother, and mother; Hydrocephalus in his brother; Hypertension in his father; Stroke (age of onset: 28) in his father.  ROS:   Please see the history of present illness.    Kidney function is being followed by Dr. Karleen Stanley as Evan Stanley of [facility].  All other systems reviewed and are negative.  EKGs/Labs/Other Studies Reviewed:    The following studies were reviewed today: 2D Doppler echocardiogram June 2021:  IMPRESSIONS  1. Left ventricular ejection fraction, by estimation, is 65 to 70%. The  left ventricle has normal function. The left ventricle has no regional  wall motion abnormalities. There is moderate left ventricular hypertrophy.  Left ventricular diastolic  parameters are consistent with Grade I diastolic dysfunction (impaired  relaxation). Elevated left ventricular end-diastolic pressure.  2. Right ventricular systolic function is  low normal. The right  ventricular size is normal.  3. The mitral valve is abnormal. Trivial mitral valve regurgitation.  4. The aortic valve has been repaired/replaced with a bioprosthetic  valve. Aortic valve regurgitation is not visualized. Aortic valve area, by  VTI measures 2.69 cm. Aortic valve mean gradient measures 8.0 mmHg. Peak  gradient is 13.5 mmHg. Aortic valve  Vmax measures 1.84 m/s.   Comparison(s): 05/15/2019: LVEF 60-65%, aortic valve mean gradient 13  mmHg, peak gradient 24 mmHg.     EKG:  EKG the last EKG tracing was May 06, 2020, demonstrating normal sinus rhythm with nonspecific ST-T wave change.  Recent Labs: 05/06/2020: Magnesium 2.0; TSH 1.036 05/09/2020: ALT 24 05/29/2020: Hemoglobin 12.2; Platelets 188 08/12/2020: BUN 19; Creatinine, Ser 1.71; Potassium 4.6; Sodium 139  Recent Lipid Panel    Component Value Date/Time   CHOL 89 12/25/2019 0257   CHOL 100 08/23/2017 0850   TRIG 60 12/25/2019 0257   HDL 30 (L) 12/25/2019 0257   HDL 32 (L) 08/23/2017 0850   CHOLHDL 3.0 12/25/2019 0257   VLDL 12 12/25/2019 0257   LDLCALC 47 12/25/2019 0257   LDLCALC 49 08/23/2017 0850   LDLDIRECT 62 02/16/2017 1151    Physical Exam:    VS:  BP 94/60   Pulse 71   Ht 5' 5"  (1.651 m)   Wt 174 lb 9.6 oz (79.2 kg)   SpO2 92%   BMI 29.05 kg/m      Wt Readings from Last 3 Encounters:  12/09/20 174 lb 9.6 oz (79.2 kg)  11/11/20 174 lb (78.9 kg)  09/20/20 172 lb (78 kg)     GEN: Consistent with age.. No acute distress HEENT: Normal NECK: No JVD. LYMPHATICS: No lymphadenopathy CARDIAC: 1/6 systolic right upper sternal murmur. RRR no gallop, or edema. VASCULAR:  Normal Pulses. No bruits. RESPIRATORY:  Clear to auscultation without rales, wheezing or rhonchi  ABDOMEN: Soft, non-tender, non-distended, No pulsatile mass, MUSCULOSKELETAL: No deformity  SKIN: Warm and dry NEUROLOGIC:  Alert and oriented x 3 PSYCHIATRIC:  Normal affect   ASSESSMENT:    1. Coronary artery disease involving native coronary artery of native heart without angina pectoris   2. S/P aortic valve replacement with tissue   3. PAD (peripheral artery disease) ([place])   4. CKD stage 3 secondary to diabetes ([place])   5. Mixed hyperlipidemia   6. PAF (paroxysmal atrial fibrillation) ([place])   7. Type 2 diabetes mellitus with stage 3a chronic kidney disease, without long-term current use of insulin ([place])   8. History of stroke in prior 3 months    PLAN:    In order of problems listed above:  1. Secondary prevention discussed.  Most recent LDL was 47 in June 2021.  This followed by primary care.  Continue Lipitor 40 mg/day. 2. Please see echo report from a year ago above.  No clinical/auscultatory evidence of dysfunction. 3. Stable without claudication. 4. Most recent creatinine 1.71 mg/dL. 5. Continue current therapy with Lipitor. 6. Continue Eliquis 5 mg twice daily.  May need to make dose adjustment depending upon kidney function. 7. Continue [name].  Consider SGL T2 if kidney function allows. 8. Continue secondary prevention.  Overall education and awareness concerning secondary risk prevention was discussed in detail: LDL less than 70, hemoglobin A1c less than 7, blood pressure target less than 130/80 mmHg, >150 minutes of moderate aerobic activity per  week, avoidance of smoking, weight control (via diet and exercise), and continued surveillance/management of/for obstructive sleep apnea.  Medication Adjustments/Labs and Tests Ordered: Current medicines are reviewed at length with the patient today.  Concerns regarding medicines are outlined above.  No orders of the defined types were placed in this encounter.  No orders of the defined types were placed in this encounter.   Patient Instructions  Medication Instructions:  Your physician recommends that you continue on your current medications as directed. Please refer to the Current Medication list given to you today.  *If you need a refill on your cardiac medications before your next appointment, please call your pharmacy*   Lab Work: None If you have labs (blood work) drawn today and your tests are completely normal, you will receive your results only by: Marland Kitchen MyChart Message (if you have MyChart) OR . A paper copy in the mail If you have any lab test that is abnormal or we need to change your treatment, we will call you to review the results.   Testing/Procedures: None   Follow-Up: At [facility], you and your health needs are our priority.  As part of our continuing mission to provide you with exceptional heart care, we have created designated Provider Care Teams.  These Care Teams include your primary Cardiologist (physician) and Advanced Practice Providers (APPs -  Physician Assistants and Nurse Practitioners) who all work together to provide you with the care you need, when you need it.  We recommend signing up for the patient portal called "MyChart".  Sign up information is provided on this After Visit Summary.  MyChart is used to connect with patients for Virtual Visits (Telemedicine).  Patients are able to view lab/test results, encounter notes, upcoming appointments, etc.  Non-urgent messages can be sent to your provider as well.   To learn more about what you can do  with MyChart, go to [email or web address].    Your next appointment:   1 year(s)  The format for your next appointment:   In Person  Provider:   You may see Evan Grooms, MD or one of the following Advanced Practice Providers on your designated Care Team:    Kathyrn Drown, NP    Other Instructions      Signed, Evan Grooms, MD  12/09/2020 12:31 PM    [place]

## 2020-12-09 ENCOUNTER — Other Ambulatory Visit: Payer: Self-pay

## 2020-12-09 ENCOUNTER — Ambulatory Visit: Payer: Medicare Other | Admitting: Interventional Cardiology

## 2020-12-09 ENCOUNTER — Encounter: Payer: Self-pay | Admitting: Interventional Cardiology

## 2020-12-09 VITALS — BP 94/60 | HR 71 | Ht 65.0 in | Wt 174.6 lb

## 2020-12-09 DIAGNOSIS — I739 Peripheral vascular disease, unspecified: Secondary | ICD-10-CM | POA: Diagnosis not present

## 2020-12-09 DIAGNOSIS — N183 Type 2 diabetes mellitus with diabetic chronic kidney disease: Secondary | ICD-10-CM

## 2020-12-09 DIAGNOSIS — E782 Mixed hyperlipidemia: Secondary | ICD-10-CM

## 2020-12-09 DIAGNOSIS — Z8673 Personal history of transient ischemic attack (TIA), and cerebral infarction without residual deficits: Secondary | ICD-10-CM | POA: Diagnosis not present

## 2020-12-09 DIAGNOSIS — I2510 Atherosclerotic heart disease of native coronary artery without angina pectoris: Secondary | ICD-10-CM | POA: Diagnosis not present

## 2020-12-09 DIAGNOSIS — N1831 Chronic kidney disease, stage 3a: Secondary | ICD-10-CM

## 2020-12-09 DIAGNOSIS — I480 Paroxysmal atrial fibrillation: Secondary | ICD-10-CM | POA: Diagnosis not present

## 2020-12-09 DIAGNOSIS — Z953 Presence of xenogenic heart valve: Secondary | ICD-10-CM

## 2020-12-09 DIAGNOSIS — E1122 Type 2 diabetes mellitus with diabetic chronic kidney disease: Secondary | ICD-10-CM

## 2020-12-09 NOTE — Patient Instructions (Signed)
Medication Instructions:  Your physician recommends that you continue on your current medications as directed. Please refer to the Current Medication list given to you today.  *If you need a refill on your cardiac medications before your next appointment, please call your pharmacy*   Lab Work: None If you have labs (blood work) drawn today and your tests are completely normal, you will receive your results only by: Marland Kitchen MyChart Message (if you have MyChart) OR . A paper copy in the mail If you have any lab test that is abnormal or we need to change your treatment, we will call you to review the results.   Testing/Procedures: None   Follow-Up: At [facility], you and your health needs are our priority.  As part of our continuing mission to provide you with exceptional heart care, we have created designated Provider Care Teams.  These Care Teams include your primary Cardiologist (physician) and Advanced Practice Providers (APPs -  Physician Assistants and Nurse Practitioners) who all work together to provide you with the care you need, when you need it.  We recommend signing up for the patient portal called "MyChart".  Sign up information is provided on this After Visit Summary.  MyChart is used to connect with patients for Virtual Visits (Telemedicine).  Patients are able to view lab/test results, encounter notes, upcoming appointments, etc.  Non-urgent messages can be sent to your provider as well.   To learn more about what you can do with MyChart, go to [email or web address].    Your next appointment:   1 year(s)  The format for your next appointment:   In Person  Provider:   You may see Sinclair Grooms, MD or one of the following Advanced Practice Providers on your designated Care Team:    Kathyrn Drown, NP    Other Instructions

## 2021-01-22 ENCOUNTER — Other Ambulatory Visit: Payer: Self-pay | Admitting: Family Medicine

## 2021-01-22 ENCOUNTER — Telehealth: Payer: Self-pay

## 2021-01-22 NOTE — Telephone Encounter (Signed)
Patient assistance came in and ready for pickup. Notified pt. 4 boxes of Tresiba and 2 boxes of novofine needles.

## 2021-01-23 NOTE — Telephone Encounter (Signed)
Pt's wife came to the office and picked up the pt's medication.  Pt's wife states the [employer] willl be sending [place] a DPR fax for the pts wife to complete, she asks we please keep that so she can fill it out at her husbands upcoming appt

## 2021-01-30 ENCOUNTER — Other Ambulatory Visit: Payer: Self-pay

## 2021-01-30 ENCOUNTER — Other Ambulatory Visit: Payer: Medicare Other

## 2021-01-30 DIAGNOSIS — E559 Vitamin D deficiency, unspecified: Secondary | ICD-10-CM | POA: Diagnosis not present

## 2021-01-30 DIAGNOSIS — N189 Chronic kidney disease, unspecified: Secondary | ICD-10-CM | POA: Diagnosis not present

## 2021-01-30 DIAGNOSIS — I129 Hypertensive chronic kidney disease with stage 1 through stage 4 chronic kidney disease, or unspecified chronic kidney disease: Secondary | ICD-10-CM | POA: Diagnosis not present

## 2021-01-30 DIAGNOSIS — I5032 Chronic diastolic (congestive) heart failure: Secondary | ICD-10-CM | POA: Diagnosis not present

## 2021-01-30 DIAGNOSIS — E1122 Type 2 diabetes mellitus with diabetic chronic kidney disease: Secondary | ICD-10-CM | POA: Diagnosis not present

## 2021-02-05 DIAGNOSIS — R809 Proteinuria, unspecified: Secondary | ICD-10-CM | POA: Diagnosis not present

## 2021-02-05 DIAGNOSIS — I129 Hypertensive chronic kidney disease with stage 1 through stage 4 chronic kidney disease, or unspecified chronic kidney disease: Secondary | ICD-10-CM | POA: Diagnosis not present

## 2021-02-05 DIAGNOSIS — I5032 Chronic diastolic (congestive) heart failure: Secondary | ICD-10-CM | POA: Diagnosis not present

## 2021-02-05 DIAGNOSIS — E559 Vitamin D deficiency, unspecified: Secondary | ICD-10-CM | POA: Diagnosis not present

## 2021-02-05 DIAGNOSIS — E1122 Type 2 diabetes mellitus with diabetic chronic kidney disease: Secondary | ICD-10-CM | POA: Diagnosis not present

## 2021-02-05 DIAGNOSIS — E1129 Type 2 diabetes mellitus with other diabetic kidney complication: Secondary | ICD-10-CM | POA: Diagnosis not present

## 2021-02-05 DIAGNOSIS — N189 Chronic kidney disease, unspecified: Secondary | ICD-10-CM | POA: Diagnosis not present

## 2021-02-05 DIAGNOSIS — D696 Thrombocytopenia, unspecified: Secondary | ICD-10-CM | POA: Diagnosis not present

## 2021-02-06 ENCOUNTER — Telehealth: Payer: Self-pay

## 2021-02-06 ENCOUNTER — Other Ambulatory Visit: Payer: Self-pay | Admitting: Family Medicine

## 2021-02-06 DIAGNOSIS — I152 Hypertension secondary to endocrine disorders: Secondary | ICD-10-CM

## 2021-02-06 DIAGNOSIS — E1159 Type 2 diabetes mellitus with other circulatory complications: Secondary | ICD-10-CM

## 2021-02-06 NOTE — Telephone Encounter (Signed)
Called patient to inform him that patient assistance novolog is ready for pickup. Only 1 box.

## 2021-02-12 ENCOUNTER — Ambulatory Visit (INDEPENDENT_AMBULATORY_CARE_PROVIDER_SITE_OTHER): Payer: Medicare Other | Admitting: Endocrinology

## 2021-02-12 ENCOUNTER — Other Ambulatory Visit: Payer: Self-pay

## 2021-02-12 ENCOUNTER — Encounter: Payer: Self-pay | Admitting: Endocrinology

## 2021-02-12 VITALS — BP 114/70 | HR 69 | Ht 65.0 in | Wt 177.6 lb

## 2021-02-12 DIAGNOSIS — E1165 Type 2 diabetes mellitus with hyperglycemia: Secondary | ICD-10-CM | POA: Diagnosis not present

## 2021-02-12 DIAGNOSIS — Z794 Long term (current) use of insulin: Secondary | ICD-10-CM | POA: Diagnosis not present

## 2021-02-12 NOTE — Patient Instructions (Addendum)
Novolog 6 at supper   Tresiba 54 units  Check blood sugars on waking up 3 days a week  Also check blood sugars about 2 hours after meals and do this after different meals by rotation  Recommended blood sugar levels on waking up are 90-130 and about 2 hours after meal is 130-160  Please bring your blood sugar monitor to each visit, thank you

## 2021-02-12 NOTE — Progress Notes (Signed)
Patient ID: Evan Stanley, male   DOB: 1941-02-13, 80 y.o.   MRN: [id number]          Reason for Appointment: Follow-up for Type 2 Diabetes    History of Present Illness:          Date of diagnosis of type 2 diabetes mellitus: 2010       Background history:   He thinks he was diagnosed to have diabetes after his coronary bypass surgery and blood sugars are not very high He had been on metformin until about 05/2017 Also at some point had taken glimepiride Usually appears to have had A1c just above 7%  Recent history:   INSULIN regimen is: [place]  50  units daily, Novolog 6-10 units at [employer] and dinner     Non-insulin hypoglycemic drugs the patient is taking are: Trulicity 1.5 mg weekly  His A1c is gradually rising, now 7.8, previously as low as 6.4   Current management, blood sugar patterns and problems identified: He has taken 10 units of NovoLog at dinnertime since previously he was having higher readings after dinner However with this his blood sugars have been as low as 80 after supper recently He appears to have changed his diet with cutting back on carbohydrates and sweets especially in the evening Not clear why his A1c has increased However his fasting readings are relatively higher compared to before Also if his blood sugars are low normal at night he will eat some peanut butter crackers or other snack, this may raise his blood sugars in the morning Currently not checking any blood sugars after breakfast, usually has some carbohydrate in the morning but generally trying to add some protein also His weight has gone up slightly Has been getting his Trulicity and insulin from patient assistance programs, taking Trulicity regularly every week        Side effects from medications have been: None  Glucose monitoring:  done about 2 times a day         Glucometer: One Touch.       Blood Glucose readings by review of home monitor download show   PRE-MEAL Fasting Lunch Dinner  Bedtime Overall  Glucose range: 103-185      Mean/median: 142    133   POST-MEAL PC Breakfast PC Lunch PC Dinner  Glucose range:   80-229  Mean/median:   125   Previously:  PRE-MEAL Fasting Lunch Dinner Bedtime Overall  Glucose range:       Mean/median:  126    151   POST-MEAL PC Breakfast PC Lunch PC Dinner  Glucose range:  190, 113  121-274  Mean/median:    172     Self-care: The diet that the patient has been following is: tries to limit sweets.     Meal times are:  Breakfast is at after 9 AM, dinner around 6 PM  Typical meal intake: Breakfast is eggs and meat and toast, sometimes waffles            Dietician visit, most recent: 2019                Weight history:  Wt Readings from Last 3 Encounters:  02/12/21 177 lb 9.6 oz (80.6 kg)  12/09/20 174 lb 9.6 oz (79.2 kg)  11/11/20 174 lb (78.9 kg)    Glycemic control:   Lab Results  Component Value Date   HGBA1C 7.8 (A) 02/12/2021   HGBA1C 7.3 (A) 11/11/2020   HGBA1C 7.1 (A) 08/12/2020  Lab Results  Component Value Date   MICROALBUR 14.4 (H) 08/12/2020   LDLCALC 47 12/25/2019   CREATININE 1.71 (H) 08/12/2020   Lab Results  Component Value Date   MICRALBCREAT 13.5 08/12/2020    Lab Results  Component Value Date   FRUCTOSAMINE 274 08/12/2020   FRUCTOSAMINE 323 (H) 04/22/2018      Allergies as of 02/12/2021       Reactions   Baclofen Other (See Comments)   Stroke like symptoms   Morphine And Related Other (See Comments)   Delirium         Medication List        Accurate as of February 12, 2021 11:59 PM. If you have any questions, ask your nurse or doctor.          acetaminophen 325 MG tablet Commonly known as: TYLENOL Take 2 tablets (650 mg total) by mouth every 6 (six) hours as needed for mild pain (or Fever >/= 101).   atorvastatin 40 MG tablet Commonly known as: LIPITOR TAKE 1 TABLET BY MOUTH  DAILY   atropine 1 % ophthalmic solution Place 1 drop into the left eye 2 (two) times  daily.   cetirizine 10 MG tablet Commonly known as: ZYRTEC Take 1 tablet by mouth as needed for allergies.   Cholecalciferol 25 MCG (1000 UT) capsule Take 1 capsule by mouth daily.   cyanocobalamin 1000 MCG tablet Take 1,000 mcg by mouth daily.   Eliquis 5 MG Tabs tablet Generic drug: apixaban TAKE 1 TABLET BY MOUTH  TWICE DAILY   hydrochlorothiazide 25 MG tablet Commonly known as: HYDRODIURIL TAKE 1 TABLET BY MOUTH  DAILY   latanoprost 0.005 % ophthalmic solution Commonly known as: XALATAN Place 1 drop into the left eye at bedtime.   lisinopril 20 MG tablet Commonly known as: ZESTRIL TAKE 1 TABLET BY MOUTH  DAILY   metoprolol succinate 100 MG 24 hr tablet Commonly known as: TOPROL-XL TAKE 1 TABLET BY MOUTH  DAILY WITH OR IMMEDIATELY  FOLLOWING A MEAL   NovoLOG FlexPen 100 UNIT/ML FlexPen Generic drug: insulin aspart Inject 6 Units into the skin daily. With supper if blood sugar is greater than 150   OneTouch Ultra test strip Generic drug: glucose blood CHECK BLOOD SUGAR TWICE  DAILY   Tresiba FlexTouch 200 UNIT/ML FlexTouch Pen Generic drug: insulin degludec Inject 50 Units into the skin daily.   Trulicity 1.5 HD/6.2IW Sopn Generic drug: Dulaglutide Inject 1.5 mg into the skin once a week. Inject the contents of one pen once per week   UNABLE TO FIND Diet: NAS and consistent carbohydrate   vitamin C 500 MG tablet Commonly known as: ASCORBIC ACID Take 500 mg by mouth daily.   vitamin E 1000 UNIT capsule Take 1,000 Units by mouth daily.        Allergies:  Allergies  Allergen Reactions   Baclofen Other (See Comments)    Stroke like symptoms   Morphine And Related Other (See Comments)    Delirium     Past Medical History:  Diagnosis Date   AKI (acute kidney injury) ([place])    Allergy    Anxiety    Arthritis    Right shoulder   Atrial fibrillation (HCC)    Carotid artery stenosis    Cataract    CHF (congestive heart failure) (HCC)    CVA  (cerebral vascular accident) ([place]) 04/28/2016   Decreased vision    left eye   Diabetes mellitus without complication ([place])  Takes Metformin   Hyperlipidemia    Hypertension    Salmonella bacteremia 04/29/2016   Sepsis ([place]) 04/29/2016   Stroke ([place]) 11/18/2015   no deficits   TIA (transient ischemic attack)    affected left eye   Urgency of urination     Past Surgical History:  Procedure Laterality Date   AORTIC VALVE REPLACEMENT     BACK SURGERY  80s   CARDIAC VALVE REPLACEMENT  11/01/2008   21-mm Edwards Pericardial Magna - Ease valve   ENDARTERECTOMY Right 01/13/2016   Procedure: ENDARTERECTOMY CAROTID - RIGHT;  Surgeon: Conrad Amory, MD;  Location: [place];  Service: Vascular;  Laterality: Right;   EYE SURGERY     laser surgery, left eye  Left 10-29-2015    retinal surgery/ Deloria Lair MD    PERIPHERAL VASCULAR CATHETERIZATION Right 11/18/2015   Procedure: Carotid Angiography;  Surgeon: Conrad [place], MD;  Location: [place] [facility];  Service: Cardiovascular;  Laterality: Right;   PERIPHERAL VASCULAR CATHETERIZATION N/A 11/18/2015   Procedure: Aortic Arch Angiography;  Surgeon: Conrad Bartow, MD;  Location: [place] [facility];  Service: Cardiovascular;  Laterality: N/A;   TEE WITHOUT CARDIOVERSION N/A 05/05/2016   Procedure: TRANSESOPHAGEAL ECHOCARDIOGRAM (TEE);  Surgeon: Lelon Perla, MD;  Location[facility] END[facility]COPY;  Service: Cardiovascular;  Laterality: N/A;    Family History  Problem Relation Age of Onset   Heart disease Mother    Cancer Mother        breast   Stroke Father 56   Hypertension Father    Early death Sister        infant death   Heart disease Brother    Hydrocephalus Brother    Heart disease Brother    Diabetes Brother    Cancer Brother        prostat   Healthy Son    Healthy Son     Social History:  reports that he quit smoking about 34 years ago. His smoking use included cigarettes. He has a 40.00 pack-year smoking history. He has never used  smokeless tobacco. He reports that he does not drink alcohol and does not use drugs.   Review of Systems   Lipid history: Lipids controlled well on atorvastatin 40 mg daily prescribed by his PCP   Has history of CAD and CVD    Lab Results  Component Value Date   CHOL 89 12/25/2019   HDL 30 (L) 12/25/2019   LDLCALC 47 12/25/2019   LDLDIRECT 62 02/16/2017   TRIG 60 12/25/2019   CHOLHDL 3.0 12/25/2019           Hypertension:  His treatment includes hydrochlorothiazide 25 mg and lisinopril 20 mg daily,   prescribed by PCP Also monitors at home   BP Readings from Last 3 Encounters:  02/12/21 114/70  12/09/20 94/60  11/11/20 118/64     CKD: Creatinine variable as follows Recently seen by nephrologist and creatinine is still about 1.7  Lab Results  Component Value Date   CREATININE 1.71 (H) 08/12/2020   CREATININE 1.68 (H) 05/29/2020   CREATININE 1.47 (H) 05/13/2020     Physical Examination:  BP 114/70   Pulse 69   Ht 5' 5"  (1.651 m)   Wt 177 lb 9.6 oz (80.6 kg)   SpO2 96%   BMI 29.55 kg/m       ASSESSMENT:  Diabetes type 2, on insulin   See history of present illness for detailed discussion of current diabetes management, blood  sugar patterns and problems identified  His A1c is expectedly higher at 7.8  He is on basal bolus insulin regimen and Trulicity 1.5 mg weekly  As before he is generally requiring much less bolus insulin compared to basal insulin Likely benefiting from Trulicity with reduced mealtime coverage needed Also with cutting back on carbohydrates his blood sugars after dinner may be low normal at times based on his intake Currently not able to adjust his blood sugar readings after breakfast also  Fasting readings are generally higher but may be sometimes related to taking snacks late at night when he is blood sugars are low normal postprandially  HYPERTENSION: Well-controlled    Renal insufficiency: Stable, continue follow-up with  nephrologist   PLAN:   He will continue his Trulicity unchanged Go up 4 units on the [place] and make a another 2 to 4 units higher if fasting readings stay over 130 Take only 6 units of NovoLog at suppertime unless eating more carbohydrate or dessert Alternate fasting and 2-hour postprandial reading after breakfast to adjust morning NovoLog Consider freestyle libre system to help monitor blood sugars more conveniently and completely    Patient Instructions  Novolog 6 at supper   Tresiba 54 units  Check blood sugars on waking up 3 days a week  Also check blood sugars about 2 hours after meals and do this after different meals by rotation  Recommended blood sugar levels on waking up are 90-130 and about 2 hours after meal is 130-160  Please bring your blood sugar monitor to each visit, thank you        [name] 02/13/2021, 9:58 AM   Note: This office note was prepared with Dragon voice recognition system technology. Any transcriptional errors that result from this process are unintentional.

## 2021-02-13 ENCOUNTER — Ambulatory Visit: Payer: Medicare Other | Admitting: Endocrinology

## 2021-02-13 LAB — POCT GLYCOSYLATED HEMOGLOBIN (HGB A1C): Hemoglobin A1C: 7.8 % — AB (ref 4.0–5.6)

## 2021-03-18 ENCOUNTER — Encounter: Payer: Self-pay | Admitting: Nurse Practitioner

## 2021-03-18 ENCOUNTER — Ambulatory Visit (INDEPENDENT_AMBULATORY_CARE_PROVIDER_SITE_OTHER): Payer: Medicare Other | Admitting: Nurse Practitioner

## 2021-03-18 VITALS — BP 147/79 | HR 94 | Temp 102.3°F

## 2021-03-18 DIAGNOSIS — R509 Fever, unspecified: Secondary | ICD-10-CM | POA: Diagnosis not present

## 2021-03-18 DIAGNOSIS — R5383 Other fatigue: Secondary | ICD-10-CM | POA: Diagnosis not present

## 2021-03-18 DIAGNOSIS — R059 Cough, unspecified: Secondary | ICD-10-CM | POA: Diagnosis not present

## 2021-03-18 LAB — VERITOR FLU A/B WAIVED
Influenza A: NEGATIVE
Influenza B: NEGATIVE

## 2021-03-18 MED ORDER — BENZONATATE 100 MG PO CAPS: 100.0000 mg | ORAL_CAPSULE | Freq: Two times a day (BID) | ORAL | 0 refills | Status: DC

## 2021-03-18 NOTE — Assessment & Plan Note (Signed)
Worsening fatigue in the last 24 hours.  Completed CBC and CMP, encourage hydration, complete COVID-19 test results pending.

## 2021-03-18 NOTE — Assessment & Plan Note (Signed)
New cough in the last 24 hours.  Completed COVID-19 results pending.  Tylenol for headache and fever, benzonatate for cough, continue to monitor patient, treat headache with Tylenol and increase hydration.

## 2021-03-18 NOTE — Assessment & Plan Note (Signed)
Fevers of 102 in the last 24 hours.  Tylenol is providing minimal to moderate therapeutic effect.  Patient to be changed drastically 24 hours ago.  Today he is unable to walk and is using a wheelchair into the clinic.  Encourage hydration, Tylenol, complete COVID test results pending.  Advised spouse to take patient to emergency department if fevers refused to subside after 3-4 treatments of Tylenol, CBC, CMP completed results pending.

## 2021-03-18 NOTE — Progress Notes (Signed)
Acute Office Visit  Subjective:    Patient ID: Evan Stanley, male    DOB: 12-23-1940, 80 y.o.   MRN: [id number]  Chief Complaint  Patient presents with   Generalized Body Aches   Fatigue   Fever    Fever  This is a new problem. The current episode started yesterday (In the last 24 hours). The problem occurs constantly. The problem has been gradually worsening. The maximum temperature noted was 102 to 102.9 F. The temperature was taken using an oral thermometer. Associated symptoms include coughing, headaches and nausea. He has tried acetaminophen for the symptoms. The treatment provided mild relief.  Cough This is a new problem. Episode onset: Last 24 hours. The problem has been gradually worsening. The problem occurs constantly. The cough is Non-productive. Associated symptoms include chills, a fever and headaches. Nothing aggravates the symptoms. He has tried nothing for the symptoms. His past medical history is significant for environmental allergies.   Patient is also reporting fatigue, in the last 24 hours patient was able to drive and today he was unable to stand on his feet and had to use a wheelchair.  It was difficult to bring patient into clinic from his car due to change in status.  Past Medical History:  Diagnosis Date   AKI (acute kidney injury) [facility])    Allergy    Anxiety    Arthritis    Right shoulder   Atrial fibrillation (HCC)    Carotid artery stenosis    Cataract    CHF (congestive heart failure) (HCC)    CVA (cerebral vascular accident) ([place]) 04/28/2016   Decreased vision    left eye   Diabetes mellitus without complication ([place])    Takes Metformin   Hyperlipidemia    Hypertension    Salmonella bacteremia 04/29/2016   Sepsis ([place]) 04/29/2016   Stroke ([place]) 11/18/2015   no deficits   TIA (transient ischemic attack)    affected left eye   Urgency of urination     Past Surgical History:  Procedure Laterality Date   AORTIC VALVE REPLACEMENT     BACK  SURGERY  80s   CARDIAC VALVE REPLACEMENT  11/01/2008   21-mm Edwards Pericardial Magna - Ease valve   ENDARTERECTOMY Right 01/13/2016   Procedure: ENDARTERECTOMY CAROTID - RIGHT;  Surgeon: Conrad Snelling, MD;  Location: [place];  Service: Vascular;  Laterality: Right;   EYE SURGERY     laser surgery, left eye  Left 10-29-2015    retinal surgery/ Deloria Lair MD    PERIPHERAL VASCULAR CATHETERIZATION Right 11/18/2015   Procedure: Carotid Angiography;  Surgeon: Conrad [place], MD;  Location: [place] CV LAB;  Service: Cardiovascular;  Laterality: Right;   PERIPHERAL VASCULAR CATHETERIZATION N/A 11/18/2015   Procedure: Aortic Arch Angiography;  Surgeon: Conrad Kelley, MD;  Location: [place] CV LAB;  Service: Cardiovascular;  Laterality: N/A;   TEE WITHOUT CARDIOVERSION N/A 05/05/2016   Procedure: TRANSESOPHAGEAL ECHOCARDIOGRAM (TEE);  Surgeon: Lelon Perla, MD;  Location: [facility] ENDOSCOPY;  Service: Cardiovascular;  Laterality: N/A;    Family History  Problem Relation Age of Onset   Heart disease Mother    Cancer Mother        breast   Stroke Father 68   Hypertension Father    Early death Sister        infant death   Heart disease Brother    Hydrocephalus Brother    Heart disease Brother    Diabetes Brother  Cancer Brother        prostat   Healthy Son    Healthy Son     Social History   Socioeconomic History   Marital status: Married    Spouse name: Not on file   Number of children: 2   Years of education: 11   Highest education level: 11th grade  Occupational History   Occupation: maintenance    Employer: [employer]    Comment: Retired  Tobacco Use   Smoking status: Former    Packs/day: 2.00    Years: 20.00    Pack years: 40.00    Types: Cigarettes    Quit date: 07/20/1986    Years since quitting: 34.6   Smokeless tobacco: Never  Vaping Use   Vaping Use: Never used  Substance and Sexual Activity   Alcohol use: No    Alcohol/week: 0.0 standard drinks   Drug use: No    Sexual activity: Yes  Other Topics Concern   Not on file  Social History Narrative   Not on file   Social Determinants of Health   Financial Resource Strain: Not on file  Food Insecurity: Not on file  Transportation Needs: Not on file  Physical Activity: Not on file  Stress: Not on file  Social Connections: Not on file  Intimate Partner Violence: Not on file    Outpatient Medications Prior to Visit  Medication Sig Dispense Refill   acetaminophen (TYLENOL) 325 MG tablet Take 2 tablets (650 mg total) by mouth every 6 (six) hours as needed for mild pain (or Fever >/= 101).     atorvastatin (LIPITOR) 40 MG tablet TAKE 1 TABLET BY MOUTH  DAILY 90 tablet 1   atropine 1 % ophthalmic solution Place 1 drop into the left eye 2 (two) times daily. 3 mL 11   cetirizine (ZYRTEC) 10 MG tablet Take 1 tablet by mouth as needed for allergies.     Cholecalciferol 25 MCG (1000 UT) capsule Take 1 capsule by mouth daily.     cyanocobalamin 1000 MCG tablet Take 1,000 mcg by mouth daily.     Dulaglutide (TRULICITY) 1.5 NG/2.9BM SOPN Inject 1.5 mg into the skin once a week. Inject the contents of one pen once per week 6 mL 5   ELIQUIS 5 MG TABS tablet TAKE 1 TABLET BY MOUTH  TWICE DAILY 180 tablet 3   hydrochlorothiazide (HYDRODIURIL) 25 MG tablet TAKE 1 TABLET BY MOUTH  DAILY 90 tablet 3   insulin aspart (NOVOLOG FLEXPEN) 100 UNIT/ML FlexPen Inject 6 Units into the skin daily. With supper if blood sugar is greater than 150     insulin degludec (TRESIBA FLEXTOUCH) 200 UNIT/ML FlexTouch Pen Inject 50 Units into the skin daily. 15 mL 5   latanoprost (XALATAN) 0.005 % ophthalmic solution Place 1 drop into the left eye at bedtime.      lisinopril (ZESTRIL) 20 MG tablet TAKE 1 TABLET BY MOUTH  DAILY 90 tablet 3   metoprolol succinate (TOPROL-XL) 100 MG 24 hr tablet TAKE 1 TABLET BY MOUTH  DAILY WITH OR IMMEDIATELY  FOLLOWING A MEAL 90 tablet 3   ONETOUCH ULTRA test strip CHECK BLOOD SUGAR TWICE  DAILY 200 strip 3    UNABLE TO FIND Diet: NAS and consistent carbohydrate     vitamin C (ASCORBIC ACID) 500 MG tablet Take 500 mg by mouth daily.     vitamin E 1000 UNIT capsule Take 1,000 Units by mouth daily.     No facility-administered medications prior to  visit.    Allergies  Allergen Reactions   Baclofen Other (See Comments)    Stroke like symptoms   Morphine And Related Other (See Comments)    Delirium     Review of Systems  Constitutional:  Positive for chills and fever.  Respiratory:  Positive for cough.   Gastrointestinal:  Positive for nausea.  Allergic/Immunologic: Positive for environmental allergies.  Neurological:  Positive for headaches.  All other systems reviewed and are negative.     Objective:    Physical Exam Vitals and nursing note reviewed. Exam conducted with a chaperone present (Spouse).  Constitutional:      General: He is awake.     Appearance: Normal appearance. He is normal weight. He is ill-appearing.     Interventions: Face mask in place.  HENT:     Head: Normocephalic.     Mouth/Throat:     Mouth: Mucous membranes are moist.  Eyes:     Conjunctiva/sclera: Conjunctivae normal.  Cardiovascular:     Rate and Rhythm: Normal rate and regular rhythm.     Pulses: Normal pulses.     Heart sounds: Normal heart sounds.  Pulmonary:     Effort: Pulmonary effort is normal.     Breath sounds: Normal breath sounds.  Abdominal:     General: Bowel sounds are normal.  Skin:    Findings: No rash.  Neurological:     Mental Status: He is alert and oriented to person, place, and time.  Psychiatric:        Mood and Affect: Mood normal.        Behavior: Behavior normal. Behavior is cooperative.    BP (!) 147/79   Pulse 94   Temp (!) 102.3 F (39.1 C) (Oral)   SpO2 97%  Wt Readings from Last 3 Encounters:  02/12/21 177 lb 9.6 oz (80.6 kg)  12/09/20 174 lb 9.6 oz (79.2 kg)  11/11/20 174 lb (78.9 kg)    Health Maintenance Due  Topic Date Due   Zoster Vaccines-  Shingrix (1 of 2) Never done   COVID-19 Vaccine (4 - Booster for Moderna series) 10/02/2020   TETANUS/TDAP  03/04/2021   INFLUENZA VACCINE  02/17/2021    There are no preventive care reminders to display for this patient.   Lab Results  Component Value Date   TSH 1.036 05/06/2020   Lab Results  Component Value Date   WBC 7.8 05/29/2020   HGB 12.2 (L) 05/29/2020   HCT 39.1 05/29/2020   MCV 90 05/29/2020   PLT 188 05/29/2020   Lab Results  Component Value Date   NA 139 08/12/2020   K 4.6 08/12/2020   CO2 33 (H) 08/12/2020   GLUCOSE 313 (H) 08/12/2020   BUN 19 08/12/2020   CREATININE 1.71 (H) 08/12/2020   BILITOT 2.0 (H) 05/09/2020   ALKPHOS 53 05/09/2020   AST 22 05/09/2020   ALT 24 05/09/2020   PROT 5.7 (L) 05/09/2020   ALBUMIN 2.9 (L) 05/09/2020   CALCIUM 9.7 08/12/2020   ANIONGAP 8 05/13/2020   GFR 38.12 (L) 08/12/2020   Lab Results  Component Value Date   CHOL 89 12/25/2019   Lab Results  Component Value Date   HDL 30 (L) 12/25/2019   Lab Results  Component Value Date   LDLCALC 47 12/25/2019   Lab Results  Component Value Date   TRIG 60 12/25/2019   Lab Results  Component Value Date   CHOLHDL 3.0 12/25/2019   Lab Results  Component Value  Date   HGBA1C 7.8 (A) 02/12/2021       Assessment & Plan:   Problem List Items Addressed This Visit       Other   Cough    New cough in the last 24 hours.  Completed COVID-19 results pending.  Tylenol for headache and fever, benzonatate for cough, continue to monitor patient, treat headache with Tylenol and increase hydration.      Relevant Medications   benzonatate (TESSALON PERLES) 100 MG capsule   Other Relevant Orders   Novel Coronavirus, NAA ([facility])   Veritor Flu A/B Waived (Completed)   Fever    Fevers of 102 in the last 24 hours.  Tylenol is providing minimal to moderate therapeutic effect.  Patient to be changed drastically 24 hours ago.  Today he is unable to walk and is using a wheelchair  into the clinic.  Encourage hydration, Tylenol, complete COVID test results pending.  Advised spouse to take patient to emergency department if fevers refused to subside after 3-4 treatments of Tylenol, CBC, CMP completed results pending.      Relevant Orders   Novel Coronavirus, NAA ([facility])   Veritor Flu A/B Waived (Completed)   Fatigue - Primary    Worsening fatigue in the last 24 hours.  Completed CBC and CMP, encourage hydration, complete COVID-19 test results pending.      Relevant Orders   Novel Coronavirus, NAA ([facility])   Veritor Flu A/B Waived (Completed)   CBC with Differential   Comprehensive metabolic panel     Meds ordered this encounter  Medications   benzonatate (TESSALON PERLES) 100 MG capsule    Sig: Take 1 capsule (100 mg total) by mouth 2 (two) times daily.    Dispense:  20 capsule    Refill:  0    Order Specific Question:   Supervising Provider    Answer:   [name] [[id number]]     Ivy Lynn, NP

## 2021-03-18 NOTE — Patient Instructions (Signed)
Fever, Adult     A fever is an increase in your body's temperature. It often means a temperature of 100.81F (38C) or higher. Brief mild or moderate fevers often have no long-term effects. They often do not need treatment. Moderate or high fevers may make you feel uncomfortable. Sometimes, they can be a sign of a serious illness or disease. A fever that keeps coming back or that lasts a long time may cause you to lose water in your body (get dehydrated). You can take your temperature with a thermometer to see if you have a fever. Temperature can change with: Age. Time of day. Where the thermometer is put in the body. Readings may vary when the thermometer is put: In the mouth (oral). In the butt (rectal). In the ear (tympanic). Under the arm (axillary). On the forehead (temporal). Follow these instructions at home: Medicines Take over-the-counter and prescription medicines only as told by your doctor. Follow the dosing instructions carefully. If you were prescribed an antibiotic medicine, take it as told by your doctor. Do not stop taking it even if you start to feel better. General instructions Watch for any changes in your symptoms. Tell your doctor about them. Rest as needed. Drink enough fluid to keep your pee (urine) pale yellow. Sponge yourself or bathe with room-temperature water as needed. This helps to lower your body temperature. Do not use ice water. Do not use too many blankets or wear clothes that are too heavy. If your fever was caused by an infection that spreads from person to person (is contagious), such as a cold or the flu: You should stay home from work and public places for at least 24 hours after your fever is gone. Your fever should be gone for at least 24 hours without the need to use medicines. Contact a doctor if: You throw up (vomit). You cannot eat or drink without throwing up. You have watery poop (diarrhea). It hurts when you pee. Your symptoms do not get  better with treatment. You have new symptoms. You feel very weak. Get help right away if: You are short of breath or have trouble breathing. You are dizzy or you pass out (faint). You feel mixed up (confused). You have signs of not having enough water in your body, such as: Dark pee, very little pee, or no pee. Cracked lips. Dry mouth. Sunken eyes. Sleepiness. Weakness. You have very bad pain in your belly (abdomen). You keep throwing up or having watery poop. You have a rash on your skin. Your symptoms get worse all of a sudden. Summary A fever is an increase in your body's temperature. It often means a temperature of 100.81F (38C) or higher. Watch for any changes in your symptoms. Tell your doctor about them. Take all medicines only as told by your doctor. Do not go to work or other public places if your fever was caused by an illness that can spread to other people. Get help right away if you have signs that you do not have enough water in your body. This information is not intended to replace advice given to you by your health care provider. Make sure you discuss any questions you have with your healthcare provider. Document Revised: 12/20/2017 Document Reviewed: 12/20/2017 Elsevier Patient Education  2022 [place]: What to Do if You Are Sick If you test positive and are an older adult or someone who is at high risk of getting very sick from COVID-19, treatment may be available. Contact  a healthcare provider right away after a positive test to determine if you are eligible, even if your symptoms are mild right now. You can also visit a Test to Treat location and, if eligible, receive a prescription from a provider. Don't delay: Treatment must be started within the first few days to be effective. If you have a fever, cough, or other symptoms, you might have COVID-19. Most people have mild illness and are able to recover at home. If you are sick: Keep track of your  symptoms. If you have an emergency warning sign (including trouble breathing), call 911. Steps to help prevent the spread of COVID-19 if you are sick If you are sick with COVID-19 or think you might have COVID-19, follow the steps below to care for yourself and to help protect other people in your home and community. Stay home except to get medical care Stay home. Most people with COVID-19 have mild illness and can recover at home without medical care. Do not leave your home, except to get medical care. Do not visit public areas and do not go to places where you are unable to wear a mask. Take care of yourself. Get rest and stay hydrated. Take over-the-counter medicines, such as acetaminophen, to help you feel better. Stay in touch with your doctor. Call before you get medical care. Be sure to get care if you have trouble breathing, or have any other emergency warning signs, or if you think it is an emergency. Avoid public transportation, ride-sharing, or taxis if possible. Get tested If you have symptoms of COVID-19, get tested. While waiting for test results, stay away from others, including staying apart from those living in your household. Get tested as soon as possible after your symptoms start. Treatments may be available for people with COVID-19 who are at risk for becoming very sick. Don't delay: Treatment must be started early to be effective--some treatments must begin within 5 days of your first symptoms. Contact your healthcare provider right away if your test result is positive to determine if you are eligible. Self-tests are one of several options for testing for the virus that causes COVID-19 and may be more convenient than laboratory-based tests and point-of-care tests. Ask your healthcare provider or your local health department if you need help interpreting your test results. You can visit your state, tribal, local, and territorial health department's website to look for the latest  local information on testing sites. Separate yourself from other people As much as possible, stay in a specific room and away from other people and pets in your home. If possible, you should use a separate bathroom. If you need to be around other people or animals in or outside of the home, wear a well-fitting mask. Tell your close contacts that they may have been exposed to COVID-19. An infected person can spread COVID-19 starting 48 hours (or 2 days) before the person has any symptoms or tests positive. By letting your close contacts know they may have been exposed to COVID-19, you are helping to protect everyone. See COVID-19 and Animals if you have questions about pets. If you are diagnosed with COVID-19, someone from the health department may call you. Answer the call to slow the spread. Monitor your symptoms Symptoms of COVID-19 include fever, cough, or other symptoms. Follow care instructions from your healthcare provider and local health department. Your local health authorities may give instructions on checking your symptoms and reporting information. When to seek emergency medical attention Look for  emergency warning signs* for COVID-19. If someone is showing any of these signs, seek emergency medical care immediately: Trouble breathing Persistent pain or pressure in the chest New confusion Inability to wake or stay awake Pale, gray, or blue-colored skin, lips, or nail beds, depending on skin tone *This list is not all possible symptoms. Please call your medical provider for any other symptoms that are severe or concerning to you. Call 911 or call ahead to your local emergency facility: Notify the operator that you are seeking care for someone who has or may have COVID-19. Call ahead before visiting your doctor Call ahead. Many medical visits for routine care are being postponed or done by phone or telemedicine. If you have a medical appointment that cannot be postponed, call your  doctor's office, and tell them you have or may have COVID-19. This will help the office protect themselves and other patients. If you are sick, wear a well-fitting mask You should wear a mask if you must be around other people or animals, including pets (even at home). Wear a mask with the best fit, protection, and comfort for you. You don't need to wear the mask if you are alone. If you can't put on a mask (because of trouble breathing, for example), cover your coughs and sneezes in some other way. Try to stay at least 6 feet away from other people. This will help protect the people around you. Masks should not be placed on young children under age 55 years, anyone who has trouble breathing, or anyone who is not able to remove the mask without help. Cover your coughs and sneezes Cover your mouth and nose with a tissue when you cough or sneeze. Throw away used tissues in a lined trash can. Immediately wash your hands with soap and water for at least 20 seconds. If soap and water are not available, clean your hands with an alcohol-based hand sanitizer that contains at least 60% alcohol. Clean your hands often Wash your hands often with soap and water for at least 20 seconds. This is especially important after blowing your nose, coughing, or sneezing; going to the bathroom; and before eating or preparing food. Use hand sanitizer if soap and water are not available. Use an alcohol-based hand sanitizer with at least 60% alcohol, covering all surfaces of your hands and rubbing them together until they feel dry. Soap and water are the best option, especially if hands are visibly dirty. Avoid touching your eyes, nose, and mouth with unwashed hands. Handwashing Tips Avoid sharing personal household items Do not share dishes, drinking glasses, cups, eating utensils, towels, or bedding with other people in your home. Wash these items thoroughly after using them with soap and water or put in the  dishwasher. Clean surfaces in your home regularly Clean and disinfect high-touch surfaces (for example, doorknobs, tables, handles, light switches, and countertops) in your "sick room" and bathroom. In shared spaces, you should clean and disinfect surfaces and items after each use by the person who is ill. If you are sick and cannot clean, a caregiver or other person should only clean and disinfect the area around you (such as your bedroom and bathroom) on an as needed basis. Your caregiver/other person should wait as long as possible (at least several hours) and wear a mask before entering, cleaning, and disinfecting shared spaces that you use. Clean and disinfect areas that may have blood, stool, or body fluids on them. Use household cleaners and disinfectants. Clean visible dirty surfaces  with household cleaners containing soap or detergent. Then, use a household disinfectant. Use a product from [employer] List N: Disinfectants for Coronavirus (EHMCN-47). Be sure to follow the instructions on the label to ensure safe and effective use of the product. Many products recommend keeping the surface wet with a disinfectant for a certain period of time (look at "contact time" on the product label). You may also need to wear personal protective equipment, such as gloves, depending on the directions on the product label. Immediately after disinfecting, wash your hands with soap and water for 20 seconds. For completed guidance on cleaning and disinfecting your home, visit Complete Disinfection Guidance. Take steps to improve ventilation at home Improve ventilation (air flow) at home to help prevent from spreading COVID-19 to other people in your household. Clear out COVID-19 virus particles in the air by opening windows, using air filters, and turning on fans in your home. Use this interactive tool to learn how to improve air flow in your home. When you can be around others after being sick with  COVID-19 Deciding when you can be around others is different for different situations. Find out when you can safely end home isolation. For any additional questions about your care, contact your healthcare provider or state or local health department. 10/08/2020 Content source: [facility] ([facility]), Division of Viral Diseases This information is not intended to replace advice given to you by your health care provider. Make sure you discuss any questions you have with your health care provider. Document Revised: 11/21/2020 Document Reviewed: 11/21/2020 Elsevier Patient Education  [place].

## 2021-03-19 ENCOUNTER — Other Ambulatory Visit: Payer: Self-pay | Admitting: Nurse Practitioner

## 2021-03-19 DIAGNOSIS — U071 COVID-19: Secondary | ICD-10-CM | POA: Insufficient documentation

## 2021-03-19 LAB — CBC WITH DIFFERENTIAL/PLATELET
Basophils Absolute: 0 10*3/uL (ref 0.0–0.2)
Basos: 0 %
EOS (ABSOLUTE): 0.1 10*3/uL (ref 0.0–0.4)
Eos: 1 %
Hematocrit: 48.2 % (ref 37.5–51.0)
Hemoglobin: 15.6 g/dL (ref 13.0–17.7)
Immature Grans (Abs): 0 10*3/uL (ref 0.0–0.1)
Immature Granulocytes: 0 %
Lymphocytes Absolute: 0.7 10*3/uL (ref 0.7–3.1)
Lymphs: 12 %
MCH: 27.6 pg (ref 26.6–33.0)
MCHC: 32.4 g/dL (ref 31.5–35.7)
MCV: 85 fL (ref 79–97)
Monocytes Absolute: 1.5 10*3/uL — ABNORMAL HIGH (ref 0.1–0.9)
Monocytes: 24 %
Neutrophils Absolute: 3.6 10*3/uL (ref 1.4–7.0)
Neutrophils: 63 %
Platelets: 118 10*3/uL — ABNORMAL LOW (ref 150–450)
RBC: 5.66 x10E6/uL (ref 4.14–5.80)
RDW: 14.3 % (ref 11.6–15.4)
WBC: 5.9 10*3/uL (ref 3.4–10.8)

## 2021-03-19 LAB — COMPREHENSIVE METABOLIC PANEL
ALT: 31 IU/L (ref 0–44)
AST: 30 IU/L (ref 0–40)
Albumin/Globulin Ratio: 1.9 (ref 1.2–2.2)
Albumin: 4.1 g/dL (ref 3.7–4.7)
Alkaline Phosphatase: 105 IU/L (ref 44–121)
BUN/Creatinine Ratio: 12 (ref 10–24)
BUN: 23 mg/dL (ref 8–27)
Bilirubin Total: 0.7 mg/dL (ref 0.0–1.2)
CO2: 26 mmol/L (ref 20–29)
Calcium: 9.4 mg/dL (ref 8.6–10.2)
Chloride: 99 mmol/L (ref 96–106)
Creatinine, Ser: 1.9 mg/dL — ABNORMAL HIGH (ref 0.76–1.27)
Globulin, Total: 2.2 g/dL (ref 1.5–4.5)
Glucose: 90 mg/dL (ref 65–99)
Potassium: 4.3 mmol/L (ref 3.5–5.2)
Sodium: 140 mmol/L (ref 134–144)
Total Protein: 6.3 g/dL (ref 6.0–8.5)
eGFR: 36 mL/min/{1.73_m2} — ABNORMAL LOW (ref >59)

## 2021-03-19 LAB — SARS-COV-2, NAA 2 DAY TAT

## 2021-03-19 LAB — NOVEL CORONAVIRUS, NAA: SARS-CoV-2, NAA: DETECTED — AB

## 2021-03-19 MED ORDER — MOLNUPIRAVIR EUA 200MG CAPSULE: 4.0000 | ORAL_CAPSULE | Freq: Two times a day (BID) | ORAL | 0 refills | Status: AC

## 2021-03-21 ENCOUNTER — Telehealth: Payer: Self-pay | Admitting: Family Medicine

## 2021-03-21 NOTE — Telephone Encounter (Signed)
Patient aware and verbalized understanding. Patient aware if he does not have symptoms he can be seen

## 2021-03-25 ENCOUNTER — Encounter: Payer: Medicare Other | Admitting: Family Medicine

## 2021-03-25 NOTE — Progress Notes (Deleted)
Evan Stanley is a 80 y.o. male presents to office today for annual physical exam examination.    Concerns today include: 1. HTN, DM DM managed by Dr Dwyane Dee.  Next OV 05/15/21.  Last A1c 7.8.  Occupation: retired, Marital status: ***, Substance use: *** Diet: ***, Exercise: *** Last eye exam: UTD Last dental exam: UTD Last colonoscopy: UTD Refills needed today: *** Immunizations needed: Immunization History  Administered Date(s) Administered   Fluad Quad(high Dose 65+) 05/23/2019, 04/25/2020   Influenza Split 04/27/2016   Influenza Whole 05/26/2010   Influenza, High Dose Seasonal PF 05/20/2017, 05/23/2018   Influenza,inj,Quad PF,6+ Mos 06/14/2013, 04/16/2014, 06/18/2015   Influenza-Unspecified 05/14/2016   Moderna Sars-Covid-2 Vaccination 08/16/2019, 09/13/2019, 07/04/2020   Pneumococcal Conjugate-13 06/18/2015   Pneumococcal Polysaccharide-23 12/16/2016   Tdap 03/05/2011     Past Medical History:  Diagnosis Date   AKI (acute kidney injury) ([place])    Allergy    Anxiety    Arthritis    Right shoulder   Atrial fibrillation (HCC)    Carotid artery stenosis    Cataract    CHF (congestive heart failure) (HCC)    CVA (cerebral vascular accident) ([place]) 04/28/2016   Decreased vision    left eye   Diabetes mellitus without complication ([place])    Takes Metformin   Hyperlipidemia    Hypertension    Salmonella bacteremia 04/29/2016   Sepsis ([place]) 04/29/2016   Stroke ([place]) 11/18/2015   no deficits   TIA (transient ischemic attack)    affected left eye   Urgency of urination    Social History   Socioeconomic History   Marital status: Married    Spouse name: Not on file   Number of children: 2   Years of education: 11   Highest education level: 11th grade  Occupational History   Occupation: maintenance    Employer: [employer]    Comment: Retired  Tobacco Use   Smoking status: Former    Packs/day: 2.00    Years: 20.00    Pack years: 40.00    Types: Cigarettes     Quit date: 07/20/1986    Years since quitting: 34.7   Smokeless tobacco: Never  Vaping Use   Vaping Use: Never used  Substance and Sexual Activity   Alcohol use: No    Alcohol/week: 0.0 standard drinks   Drug use: No   Sexual activity: Yes  Other Topics Concern   Not on file  Social History Narrative   Not on file   Social Determinants of Health   Financial Resource Strain: Not on file  Food Insecurity: Not on file  Transportation Needs: Not on file  Physical Activity: Not on file  Stress: Not on file  Social Connections: Not on file  Intimate Partner Violence: Not on file   Past Surgical History:  Procedure Laterality Date   AORTIC VALVE REPLACEMENT     BACK SURGERY  80s   CARDIAC VALVE REPLACEMENT  11/01/2008   21-mm Edwards Pericardial Magna - Ease valve   ENDARTERECTOMY Right 01/13/2016   Procedure: ENDARTERECTOMY CAROTID - RIGHT;  Surgeon: Conrad Naselle, MD;  Location: [place];  Service: Vascular;  Laterality: Right;   EYE SURGERY     laser surgery, left eye  Left 10-29-2015    retinal surgery/ Deloria Lair MD    PERIPHERAL VASCULAR CATHETERIZATION Right 11/18/2015   Procedure: Carotid Angiography;  Surgeon: Conrad Iron Belt, MD;  Location: [place] [facility];  Service: Cardiovascular;  Laterality: Right;  PERIPHERAL VASCULAR CATHETERIZATION N/A 11/18/2015   Procedure: Aortic Arch Angiography;  Surgeon: Conrad [place], MD;  Location: [place] [facility];  Service: Cardiovascular;  Laterality: N/A;   TEE WITHOUT CARDIOVERSION N/A 05/05/2016   Procedure: TRANSESOPHAGEAL ECHOCARDIOGRAM (TEE);  Surgeon: Lelon Perla, MD;  Location: [facility] ENDOSCOPY;  Service: Cardiovascular;  Laterality: N/A;   Family History  Problem Relation Age of Onset   Heart disease Mother    Cancer Mother        breast   Stroke Father 68   Hypertension Father    Early death Sister        infant death   Heart disease Brother    Hydrocephalus Brother    Heart disease Brother    Diabetes Brother    Cancer  Brother        prostat   Healthy Son    Healthy Son     Current Outpatient Medications:    acetaminophen (TYLENOL) 325 MG tablet, Take 2 tablets (650 mg total) by mouth every 6 (six) hours as needed for mild pain (or Fever >/= 101)., Disp: , Rfl:    atorvastatin (LIPITOR) 40 MG tablet, TAKE 1 TABLET BY MOUTH  DAILY, Disp: 90 tablet, Rfl: 1   atropine 1 % ophthalmic solution, Place 1 drop into the left eye 2 (two) times daily., Disp: 3 mL, Rfl: 11   benzonatate (TESSALON PERLES) 100 MG capsule, Take 1 capsule (100 mg total) by mouth 2 (two) times daily., Disp: 20 capsule, Rfl: 0   cetirizine (ZYRTEC) 10 MG tablet, Take 1 tablet by mouth as needed for allergies., Disp: , Rfl:    Cholecalciferol 25 MCG (1000 UT) capsule, Take 1 capsule by mouth daily., Disp: , Rfl:    cyanocobalamin 1000 MCG tablet, Take 1,000 mcg by mouth daily., Disp: , Rfl:    Dulaglutide (TRULICITY) 1.5 GE/3.6OQ SOPN, Inject 1.5 mg into the skin once a week. Inject the contents of one pen once per week, Disp: 6 mL, Rfl: 5   ELIQUIS 5 MG TABS tablet, TAKE 1 TABLET BY MOUTH  TWICE DAILY, Disp: 180 tablet, Rfl: 3   hydrochlorothiazide (HYDRODIURIL) 25 MG tablet, TAKE 1 TABLET BY MOUTH  DAILY, Disp: 90 tablet, Rfl: 3   insulin aspart (NOVOLOG FLEXPEN) 100 UNIT/ML FlexPen, Inject 6 Units into the skin daily. With supper if blood sugar is greater than 150, Disp: , Rfl:    insulin degludec (TRESIBA FLEXTOUCH) 200 UNIT/ML FlexTouch Pen, Inject 50 Units into the skin daily., Disp: 15 mL, Rfl: 5   latanoprost (XALATAN) 0.005 % ophthalmic solution, Place 1 drop into the left eye at bedtime. , Disp: , Rfl:    lisinopril (ZESTRIL) 20 MG tablet, TAKE 1 TABLET BY MOUTH  DAILY, Disp: 90 tablet, Rfl: 3   metoprolol succinate (TOPROL-XL) 100 MG 24 hr tablet, TAKE 1 TABLET BY MOUTH  DAILY WITH OR IMMEDIATELY  FOLLOWING A MEAL, Disp: 90 tablet, Rfl: 3   ONETOUCH ULTRA test strip, CHECK BLOOD SUGAR TWICE  DAILY, Disp: 200 strip, Rfl: 3   UNABLE  TO FIND, Diet: NAS and consistent carbohydrate, Disp: , Rfl:    vitamin C (ASCORBIC ACID) 500 MG tablet, Take 500 mg by mouth daily., Disp: , Rfl:    vitamin E 1000 UNIT capsule, Take 1,000 Units by mouth daily., Disp: , Rfl:   Allergies  Allergen Reactions   Baclofen Other (See Comments)    Stroke like symptoms   Morphine And Related Other (See Comments)  Delirium      ROS: Review of Systems {ros; complete:30496}    Physical exam {Exam, Complete:[id number]}    Assessment/ Plan: Evan Stanley here for annual physical exam.   No problem-specific Assessment & Plan notes found for this encounter.   Counseled on healthy lifestyle choices, including diet (rich in fruits, vegetables and lean meats and low in salt and simple carbohydrates) and exercise (at least 30 minutes of moderate physical activity daily).  Patient to follow up in 1 year for annual exam or sooner if needed.  Nanci Lakatos M. [name], DO

## 2021-04-03 ENCOUNTER — Other Ambulatory Visit: Payer: Medicare Other

## 2021-04-03 ENCOUNTER — Other Ambulatory Visit: Payer: Self-pay

## 2021-04-03 DIAGNOSIS — E1122 Type 2 diabetes mellitus with diabetic chronic kidney disease: Secondary | ICD-10-CM | POA: Diagnosis not present

## 2021-04-03 DIAGNOSIS — I5032 Chronic diastolic (congestive) heart failure: Secondary | ICD-10-CM | POA: Diagnosis not present

## 2021-04-03 DIAGNOSIS — R809 Proteinuria, unspecified: Secondary | ICD-10-CM | POA: Diagnosis not present

## 2021-04-03 DIAGNOSIS — N189 Chronic kidney disease, unspecified: Secondary | ICD-10-CM | POA: Diagnosis not present

## 2021-04-03 DIAGNOSIS — I129 Hypertensive chronic kidney disease with stage 1 through stage 4 chronic kidney disease, or unspecified chronic kidney disease: Secondary | ICD-10-CM | POA: Diagnosis not present

## 2021-04-04 ENCOUNTER — Ambulatory Visit (HOSPITAL_COMMUNITY)
Admission: RE | Admit: 2021-04-04 | Discharge: 2021-04-04 | Disposition: A | Discharge status: Home / Self Care | Payer: Medicare Other | Source: Ambulatory Visit | Attending: Family Medicine | Admitting: Family Medicine

## 2021-04-04 ENCOUNTER — Encounter: Payer: Self-pay | Admitting: Family Medicine

## 2021-04-04 ENCOUNTER — Ambulatory Visit (INDEPENDENT_AMBULATORY_CARE_PROVIDER_SITE_OTHER): Payer: Medicare Other | Admitting: Family Medicine

## 2021-04-04 VITALS — BP 116/68 | HR 78 | Temp 97.5°F | Ht 65.0 in | Wt 176.0 lb

## 2021-04-04 DIAGNOSIS — R2681 Unsteadiness on feet: Secondary | ICD-10-CM

## 2021-04-04 DIAGNOSIS — R454 Irritability and anger: Secondary | ICD-10-CM

## 2021-04-04 DIAGNOSIS — R410 Disorientation, unspecified: Secondary | ICD-10-CM | POA: Diagnosis not present

## 2021-04-04 DIAGNOSIS — G2401 Drug induced subacute dyskinesia: Secondary | ICD-10-CM | POA: Diagnosis not present

## 2021-04-04 DIAGNOSIS — R6889 Other general symptoms and signs: Secondary | ICD-10-CM | POA: Diagnosis not present

## 2021-04-04 NOTE — Progress Notes (Signed)
Subjective:  Patient ID: Evan Stanley, male    DOB: 1940/12/27, 79 y.o.   MRN: [id number]  Patient Care Team: [name], DO as PCP - General (Family Medicine) Belva Crome, MD as PCP - Cardiology (Cardiology) Conrad Salem, MD as Consulting Physician (Vascular Surgery) Hurman Horn, MD as Consulting Physician (Ophthalmology) Clent Jacks, MD as Consulting Physician (Ophthalmology) [name], MD as Consulting Physician (Endocrinology) Lavera Guise, [facility] (Pharmacist)   Chief Complaint:  Fall (Recent fall, off-balance, restlessness/fidgety)   HPI: Evan Stanley is a 80 y.o. male presenting on 04/04/2021 for Fall (Recent fall, off-balance, restlessness/fidgety)   Pt presents today with wife for altered mental status. He was diagnosed with COVID-19 03/19/2021 and treated with molnupiravir. Wife states since illness pt has not been acting normal. States he is irritable, fidgety, unsteady, confused, weak, and having jerking movements all of the time. She states this started a few days after he was diagnosed and has continued. He refuses to go to the hospital for evaluation.   Fall The accident occurred 12 to 24 hours ago. The fall occurred while walking. He fell from a height of 3 to 5 ft. He landed on [place]. There was no blood loss. The point of impact was the buttocks and right knee. The patient is experiencing no pain. Pertinent negatives include no abdominal pain, fever, headaches, nausea, numbness or vomiting.    Relevant past medical, surgical, family, and social history reviewed and updated as indicated.  Allergies and medications reviewed and updated. Data reviewed: Chart in Epic.   Past Medical History:  Diagnosis Date   AKI (acute kidney injury) [facility])    Allergy    Anxiety    Arthritis    Right shoulder   Atrial fibrillation (HCC)    Carotid artery stenosis    Cataract    CHF (congestive heart failure) (HCC)    CVA (cerebral vascular accident)  ([place]) 04/28/2016   Decreased vision    left eye   Diabetes mellitus without complication ([place])    Takes Metformin   Hyperlipidemia    Hypertension    Salmonella bacteremia 04/29/2016   Sepsis ([place]) 04/29/2016   Stroke ([place]) 11/18/2015   no deficits   TIA (transient ischemic attack)    affected left eye   Urgency of urination     Past Surgical History:  Procedure Laterality Date   AORTIC VALVE REPLACEMENT     BACK SURGERY  80s   CARDIAC VALVE REPLACEMENT  11/01/2008   21-mm Edwards Pericardial Magna - Ease valve   ENDARTERECTOMY Right 01/13/2016   Procedure: ENDARTERECTOMY CAROTID - RIGHT;  Surgeon: Conrad Honokaa, MD;  Location: [place];  Service: Vascular;  Laterality: Right;   EYE SURGERY     laser surgery, left eye  Left 10-29-2015    retinal surgery/ Deloria Lair MD    PERIPHERAL VASCULAR CATHETERIZATION Right 11/18/2015   Procedure: Carotid Angiography;  Surgeon: Conrad Allakaket, MD;  Location: [place] [facility];  Service: Cardiovascular;  Laterality: Right;   PERIPHERAL VASCULAR CATHETERIZATION N/A 11/18/2015   Procedure: Aortic Arch Angiography;  Surgeon: Conrad Hazlehurst, MD;  Location: [place] [facility];  Service: Cardiovascular;  Laterality: N/A;   TEE WITHOUT CARDIOVERSION N/A 05/05/2016   Procedure: TRANSESOPHAGEAL ECHOCARDIOGRAM (TEE);  Surgeon: Lelon Perla, MD;  Location: [facility] ENDOSCOPY;  Service: Cardiovascular;  Laterality: N/A;    Social History   Socioeconomic History   Marital status: Married  Spouse name: Not on file   Number of children: 2   Years of education: 33   Highest education level: 11th grade  Occupational History   Occupation: maintenance    Employer: [employer]    Comment: Retired  Tobacco Use   Smoking status: Former    Packs/day: 2.00    Years: 20.00    Pack years: 40.00    Types: Cigarettes    Quit date: 07/20/1986    Years since quitting: 34.7   Smokeless tobacco: Never  Vaping Use   Vaping Use: Never used  Substance and Sexual Activity    Alcohol use: No    Alcohol/week: 0.0 standard drinks   Drug use: No   Sexual activity: Yes  Other Topics Concern   Not on file  Social History Narrative   Not on file   Social Determinants of Health   Financial Resource Strain: Not on file  Food Insecurity: Not on file  Transportation Needs: Not on file  Physical Activity: Not on file  Stress: Not on file  Social Connections: Not on file  Intimate Partner Violence: Not on file    Outpatient Encounter Medications as of 04/04/2021  Medication Sig   acetaminophen (TYLENOL) 325 MG tablet Take 2 tablets (650 mg total) by mouth every 6 (six) hours as needed for mild pain (or Fever >/= 101).   atorvastatin (LIPITOR) 40 MG tablet TAKE 1 TABLET BY MOUTH  DAILY   atropine 1 % ophthalmic solution Place 1 drop into the left eye 2 (two) times daily.   benzonatate (TESSALON PERLES) 100 MG capsule Take 1 capsule (100 mg total) by mouth 2 (two) times daily.   cetirizine (ZYRTEC) 10 MG tablet Take 1 tablet by mouth as needed for allergies.   Cholecalciferol 25 MCG (1000 UT) capsule Take 1 capsule by mouth daily.   cyanocobalamin 1000 MCG tablet Take 1,000 mcg by mouth daily.   Dulaglutide (TRULICITY) 1.5 ZD/6.3OV SOPN Inject 1.5 mg into the skin once a week. Inject the contents of one pen once per week   ELIQUIS 5 MG TABS tablet TAKE 1 TABLET BY MOUTH  TWICE DAILY   hydrochlorothiazide (HYDRODIURIL) 25 MG tablet TAKE 1 TABLET BY MOUTH  DAILY   insulin aspart (NOVOLOG FLEXPEN) 100 UNIT/ML FlexPen Inject 6 Units into the skin daily. With supper if blood sugar is greater than 150   insulin degludec (TRESIBA FLEXTOUCH) 200 UNIT/ML FlexTouch Pen Inject 50 Units into the skin daily.   latanoprost (XALATAN) 0.005 % ophthalmic solution Place 1 drop into the left eye at bedtime.    lisinopril (ZESTRIL) 20 MG tablet TAKE 1 TABLET BY MOUTH  DAILY   metoprolol succinate (TOPROL-XL) 100 MG 24 hr tablet TAKE 1 TABLET BY MOUTH  DAILY WITH OR IMMEDIATELY   FOLLOWING A MEAL   ONETOUCH ULTRA test strip CHECK BLOOD SUGAR TWICE  DAILY   UNABLE TO FIND Diet: NAS and consistent carbohydrate   vitamin C (ASCORBIC ACID) 500 MG tablet Take 500 mg by mouth daily.   vitamin E 1000 UNIT capsule Take 1,000 Units by mouth daily.   No facility-administered encounter medications on file as of 04/04/2021.    Allergies  Allergen Reactions   Baclofen Other (See Comments)    Stroke like symptoms   Morphine And Related Other (See Comments)    Delirium     Review of Systems  Constitutional:  Positive for activity change and appetite change. Negative for chills, diaphoresis, fatigue, fever and unexpected weight change.  HENT: Negative.    Eyes: Negative.   Respiratory:  Negative for cough, chest tightness and shortness of breath.   Cardiovascular:  Negative for chest pain, palpitations and leg swelling.  Gastrointestinal:  Negative for abdominal pain, blood in stool, constipation, diarrhea, nausea and vomiting.  Endocrine: Negative.   Genitourinary:  Negative for decreased urine volume, difficulty urinating, dysuria, frequency and urgency.  Musculoskeletal:  Positive for gait problem. Negative for arthralgias, myalgias, neck pain and neck stiffness.  Skin: Negative.   Allergic/Immunologic: Negative.   Neurological:  Positive for speech difficulty and weakness. Negative for dizziness, tremors, seizures, syncope, facial asymmetry, light-headedness, numbness and headaches.  Hematological: Negative.   Psychiatric/Behavioral:  Positive for agitation, behavioral problems, confusion, dysphoric mood and sleep disturbance. Negative for decreased concentration, hallucinations, self-injury and suicidal ideas. The patient is nervous/anxious and is hyperactive.   All other systems reviewed and are negative.      Objective:  BP 116/68   Pulse 78   Temp (!) 97.5 F (36.4 C)   Ht 5' 5"  (1.651 m)   Wt 176 lb (79.8 kg)   SpO2 94%   BMI 29.29 kg/m    Wt Readings  from Last 3 Encounters:  04/04/21 176 lb (79.8 kg)  02/12/21 177 lb 9.6 oz (80.6 kg)  12/09/20 174 lb 9.6 oz (79.2 kg)    Physical Exam Vitals and nursing note reviewed.  Constitutional:      General: He is not in acute distress.    Appearance: Normal appearance. He is well-developed, well-groomed and overweight. He is not ill-appearing, toxic-appearing or diaphoretic.  HENT:     Head: Normocephalic and atraumatic.     Jaw: There is normal jaw occlusion.     Right Ear: Hearing normal.     Left Ear: Hearing normal.     Nose: Nose normal.     Mouth/Throat:     Lips: Pink.     Mouth: Mucous membranes are moist.     Pharynx: Oropharynx is clear. Uvula midline.     Comments: Uncontrolled movements of tongue and head. No nuchal rigidity.  Eyes:     General: Lids are normal.     Extraocular Movements: Extraocular movements intact.     Pupils: Pupils are equal, round, and reactive to light.     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  Neck:     Thyroid: No thyroid mass, thyromegaly or thyroid tenderness.     Vascular: No carotid bruit or JVD.     Trachea: Trachea and phonation normal.     Comments: Uncontrolled movement of head and neck.  Cardiovascular:     Rate and Rhythm: Normal rate and regular rhythm.     Chest Wall: PMI is not displaced.     Pulses: Normal pulses.     Heart sounds: Normal heart sounds. No murmur heard.   No friction rub. No gallop.  Pulmonary:     Effort: Pulmonary effort is normal. No respiratory distress.     Breath sounds: Normal breath sounds. No wheezing or rhonchi.  Abdominal:     General: Bowel sounds are normal. There is no distension or abdominal bruit.     Palpations: Abdomen is soft. There is no hepatomegaly or splenomegaly.     Tenderness: There is no abdominal tenderness. There is no right CVA tenderness or left CVA tenderness.     Hernia: No hernia is present.  Musculoskeletal:        General: Normal range of motion.  Cervical back: Neck supple. No  edema, erythema, signs of trauma, rigidity, torticollis or crepitus. No pain with movement, spinous process tenderness or muscular tenderness. Normal range of motion.     Right lower leg: No edema.     Left lower leg: No edema.     Comments: Uncontrolled movement of arms and legs  Lymphadenopathy:     Cervical: No cervical adenopathy.  Skin:    General: Skin is warm and dry.     Capillary Refill: Capillary refill takes less than 2 seconds.     Coloration: Skin is not cyanotic, jaundiced or pale.     Findings: No rash.  Neurological:     Mental Status: He is alert and oriented to person, place, and time.     Cranial Nerves: Dysarthria present.     Motor: Weakness (generalized) present.     Coordination: Coordination abnormal. Finger-Nose-Finger Test abnormal. Impaired rapid alternating movements.     Gait: Gait abnormal (unsteady, using cane).     Deep Tendon Reflexes: Reflexes normal.  Psychiatric:        Attention and Perception: Attention and perception normal.        Mood and Affect: Mood and affect normal.        Speech: Speech normal.        Behavior: Behavior normal. Behavior is cooperative.        Thought Content: Thought content normal.        Cognition and Memory: Cognition and memory normal.        Judgment: Judgment normal.    Results for orders placed or performed in visit on 03/18/21  Novel Coronavirus, NAA ([facility])   Specimen: Nasopharyngeal(NP) swabs in vial transport medium  Result Value Ref Range   SARS-CoV-2, NAA Detected (A) Not Detected  SARS-COV-2, NAA 2 DAY TAT  Result Value Ref Range   SARS-CoV-2, NAA 2 DAY TAT Performed   Veritor Flu A/B Waived  Result Value Ref Range   Influenza A Negative Negative   Influenza B Negative Negative  CBC with Differential  Result Value Ref Range   WBC 5.9 3.4 - 10.8 x10E3/uL   RBC 5.66 4.14 - 5.80 x10E6/uL   Hemoglobin 15.6 13.0 - 17.7 g/dL   Hematocrit 48.2 37.5 - 51.0 %   MCV 85 79 - 97 fL   MCH 27.6 26.6 - 33.0  pg   MCHC 32.4 31.5 - 35.7 g/dL   RDW 14.3 11.6 - 15.4 %   Platelets 118 (L) 150 - 450 x10E3/uL   Neutrophils 63 Not Estab. %   Lymphs 12 Not Estab. %   Monocytes 24 Not Estab. %   Eos 1 Not Estab. %   Basos 0 Not Estab. %   Neutrophils Absolute 3.6 1.4 - 7.0 x10E3/uL   Lymphocytes Absolute 0.7 0.7 - 3.1 x10E3/uL   Monocytes Absolute 1.5 (H) 0.1 - 0.9 x10E3/uL   EOS (ABSOLUTE) 0.1 0.0 - 0.4 x10E3/uL   Basophils Absolute 0.0 0.0 - 0.2 x10E3/uL   Immature Granulocytes 0 Not Estab. %   Immature Grans (Abs) 0.0 0.0 - 0.1 x10E3/uL   Hematology Comments: Note:   Comprehensive metabolic panel  Result Value Ref Range   Glucose 90 65 - 99 mg/dL   BUN 23 8 - 27 mg/dL   Creatinine, Ser 1.90 (H) 0.76 - 1.27 mg/dL   eGFR 36 (L) >59 mL/min/1.73   BUN/Creatinine Ratio 12 10 - 24   Sodium 140 134 - 144 mmol/L   Potassium 4.3 3.5 -  5.2 mmol/L   Chloride 99 96 - 106 mmol/L   CO2 26 20 - 29 mmol/L   Calcium 9.4 8.6 - 10.2 mg/dL   Total Protein 6.3 6.0 - 8.5 g/dL   Albumin 4.1 3.7 - 4.7 g/dL   Globulin, Total 2.2 1.5 - 4.5 g/dL   Albumin/Globulin Ratio 1.9 1.2 - 2.2   Bilirubin Total 0.7 0.0 - 1.2 mg/dL   Alkaline Phosphatase 105 44 - 121 IU/L   AST 30 0 - 40 IU/L   ALT 31 0 - 44 IU/L       Pertinent labs & imaging results that were available during my care of the patient were reviewed by me and considered in my medical decision making.  Assessment & Plan:  Evan Stanley was seen today for fall.  Diagnoses and all orders for this visit:  Disorientation Unsteady gait Irritability New onset since COVID-19 illness. Differential include, but are not limited to, viral meningitis, CVA, electrolyte imbalances, encephalopathy, renal failure, or hyponatremia. Pt refuses to go to ED for evaluation and treatment. Will obtain below labs and STAT head CT. Urgent referral to neurology placed. Wife aware if any new or worsening symptoms present to take pt to the ED. Wife reports fall last night but symptoms  started several days ago and did not worsen after fall.  -     CBC with Differential/Platelet -     CMP14+EGFR -     Vitamin B12 -     Ammonia -     VITAMIN D 25 Hydroxy (Vit-D Deficiency, Fractures) -     Folate -     CT HEAD WO CONTRAST (5MM); Future -     Ambulatory referral to Neurology  Tardive dyskinesia New onset since COVID-19 illness. No new medications. Will place urgent referral to neurology. STAT head CT ordered -     Ambulatory referral to Neurology     Continue all other maintenance medications.  Follow up plan: Return in about 1 week (around 04/11/2021), or if symptoms worsen or fail to improve, for [facility] now for imaging.   Continue healthy lifestyle choices, including diet (rich in fruits, vegetables, and lean proteins, and low in salt and simple carbohydrates) and exercise (at least 30 minutes of moderate physical activity daily).   The above assessment and management plan was discussed with the patient. The patient verbalized understanding of and has agreed to the management plan. Patient is aware to call the clinic if they develop any new symptoms or if symptoms persist or worsen. Patient is aware when to return to the clinic for a follow-up visit. Patient educated on when it is appropriate to go to the emergency department.   Monia Pouch, FNP-C [facility] [phone]

## 2021-04-05 LAB — CMP14+EGFR
ALT: 22 IU/L (ref 0–44)
AST: 23 IU/L (ref 0–40)
Albumin/Globulin Ratio: 2.1 (ref 1.2–2.2)
Albumin: 4.1 g/dL (ref 3.7–4.7)
Alkaline Phosphatase: 86 IU/L (ref 44–121)
BUN/Creatinine Ratio: 13 (ref 10–24)
BUN: 25 mg/dL (ref 8–27)
Bilirubin Total: 0.6 mg/dL (ref 0.0–1.2)
CO2: 28 mmol/L (ref 20–29)
Calcium: 10.1 mg/dL (ref 8.6–10.2)
Chloride: 104 mmol/L (ref 96–106)
Creatinine, Ser: 1.91 mg/dL — ABNORMAL HIGH (ref 0.76–1.27)
Globulin, Total: 2 g/dL (ref 1.5–4.5)
Glucose: 101 mg/dL — ABNORMAL HIGH (ref 65–99)
Potassium: 5 mmol/L (ref 3.5–5.2)
Sodium: 143 mmol/L (ref 134–144)
Total Protein: 6.1 g/dL (ref 6.0–8.5)
eGFR: 35 mL/min/{1.73_m2} — ABNORMAL LOW (ref >59)

## 2021-04-05 LAB — CBC WITH DIFFERENTIAL/PLATELET
Basophils Absolute: 0.1 10*3/uL (ref 0.0–0.2)
Basos: 1 %
EOS (ABSOLUTE): 0.2 10*3/uL (ref 0.0–0.4)
Eos: 2 %
Hematocrit: 45.7 % (ref 37.5–51.0)
Hemoglobin: 15 g/dL (ref 13.0–17.7)
Immature Grans (Abs): 0 10*3/uL (ref 0.0–0.1)
Immature Granulocytes: 0 %
Lymphocytes Absolute: 1.7 10*3/uL (ref 0.7–3.1)
Lymphs: 20 %
MCH: 28.5 pg (ref 26.6–33.0)
MCHC: 32.8 g/dL (ref 31.5–35.7)
MCV: 87 fL (ref 79–97)
Monocytes Absolute: 1.2 10*3/uL — ABNORMAL HIGH (ref 0.1–0.9)
Monocytes: 14 %
Neutrophils Absolute: 5.6 10*3/uL (ref 1.4–7.0)
Neutrophils: 63 %
Platelets: 169 10*3/uL (ref 150–450)
RBC: 5.26 x10E6/uL (ref 4.14–5.80)
RDW: 14.6 % (ref 11.6–15.4)
WBC: 8.8 10*3/uL (ref 3.4–10.8)

## 2021-04-05 LAB — VITAMIN B12: Vitamin B-12: 1640 pg/mL — ABNORMAL HIGH (ref 232–1245)

## 2021-04-05 LAB — FOLATE: Folate: >20 ng/mL (ref >3.0)

## 2021-04-05 LAB — AMMONIA: Ammonia: 28 ug/dL — ABNORMAL LOW (ref 31–169)

## 2021-04-05 LAB — VITAMIN D 25 HYDROXY (VIT D DEFICIENCY, FRACTURES): Vit D, 25-Hydroxy: 59.9 ng/mL (ref 30.0–100.0)

## 2021-04-06 ENCOUNTER — Telehealth: Payer: Self-pay | Admitting: Family Medicine

## 2021-04-06 NOTE — Telephone Encounter (Signed)
Call service center called on 04/04/2021 at 6:02 PM with the head CT report - chronic changes, nothing acute. Called patient and his wife to advise their was nothing acute on the CT scan and that M, Rakes would be in contact next week with his lab results.

## 2021-04-09 ENCOUNTER — Telehealth: Payer: Self-pay | Admitting: Adult Health

## 2021-04-09 DIAGNOSIS — I129 Hypertensive chronic kidney disease with stage 1 through stage 4 chronic kidney disease, or unspecified chronic kidney disease: Secondary | ICD-10-CM | POA: Diagnosis not present

## 2021-04-09 DIAGNOSIS — N189 Chronic kidney disease, unspecified: Secondary | ICD-10-CM | POA: Diagnosis not present

## 2021-04-09 DIAGNOSIS — E1129 Type 2 diabetes mellitus with other diabetic kidney complication: Secondary | ICD-10-CM | POA: Diagnosis not present

## 2021-04-09 DIAGNOSIS — G2401 Drug induced subacute dyskinesia: Secondary | ICD-10-CM | POA: Diagnosis not present

## 2021-04-09 DIAGNOSIS — U099 Post covid-19 condition, unspecified: Secondary | ICD-10-CM | POA: Diagnosis not present

## 2021-04-09 DIAGNOSIS — E559 Vitamin D deficiency, unspecified: Secondary | ICD-10-CM | POA: Diagnosis not present

## 2021-04-09 DIAGNOSIS — I5032 Chronic diastolic (congestive) heart failure: Secondary | ICD-10-CM | POA: Diagnosis not present

## 2021-04-09 DIAGNOSIS — E1122 Type 2 diabetes mellitus with diabetic chronic kidney disease: Secondary | ICD-10-CM | POA: Diagnosis not present

## 2021-04-09 DIAGNOSIS — R809 Proteinuria, unspecified: Secondary | ICD-10-CM | POA: Diagnosis not present

## 2021-04-10 ENCOUNTER — Telehealth: Payer: Self-pay | Admitting: Adult Health

## 2021-04-10 NOTE — Telephone Encounter (Signed)
I called pt's wife back. We reviewed the results of the CT together and she verbalized understanding.   She sts over last few weeks pt has been more agitated, confused, weak in his legs, and experiencing repetitive moments of the mouth.  She sts his Kidney Specialist had sent a referral about being urgently worked in for this.  I did locate the referral from the Kidney specialist and a referral from PCP for these issues. I advised it would be better for the pt to see once of our physicians rather than the NP given the newness of these issues. Pt has only seen Janett Billow NP for stroke f/u. Last visit was 01/31/2020.   Discussed verbally with referrals and confirmed pt should have a new pt visit with the neurologist.  I have scheduled pt for 05/01/2021 at 900 am with Dr. Krista Blue. Pt's wife requested pt be added to the wait list for a sooner available appt.

## 2021-04-10 NOTE — Telephone Encounter (Signed)
Pt's wife is wanting a call to discuss CT scan results that were faxed here from pt's other Dr

## 2021-04-11 ENCOUNTER — Telehealth: Payer: Self-pay

## 2021-04-11 NOTE — Telephone Encounter (Signed)
I called patient, I need to know if patient needs refill on the pen needles he gets through patient assistance. I called no answer. Left vm to call me back to let me know if he needs before we send request over.

## 2021-04-14 ENCOUNTER — Encounter: Payer: Self-pay | Admitting: Neurology

## 2021-04-14 ENCOUNTER — Ambulatory Visit: Payer: Medicare Other | Admitting: Neurology

## 2021-04-14 ENCOUNTER — Telehealth: Payer: Self-pay | Admitting: Neurology

## 2021-04-14 VITALS — BP 154/70 | HR 80 | Ht 65.0 in | Wt 173.0 lb

## 2021-04-14 DIAGNOSIS — R259 Unspecified abnormal involuntary movements: Secondary | ICD-10-CM | POA: Diagnosis not present

## 2021-04-14 DIAGNOSIS — I639 Cerebral infarction, unspecified: Secondary | ICD-10-CM | POA: Insufficient documentation

## 2021-04-14 DIAGNOSIS — R269 Unspecified abnormalities of gait and mobility: Secondary | ICD-10-CM | POA: Diagnosis not present

## 2021-04-14 DIAGNOSIS — F0390 Unspecified dementia without behavioral disturbance: Secondary | ICD-10-CM | POA: Diagnosis not present

## 2021-04-14 HISTORY — DX: Cerebral infarction, unspecified: I63.9

## 2021-04-14 NOTE — Telephone Encounter (Signed)
[employer] medicare order sent to GI, [facility] they will reach out to the patient to schedule.  

## 2021-04-14 NOTE — Telephone Encounter (Signed)
Pt spouse called back and confirmed patient will need refills of needles.

## 2021-04-14 NOTE — Progress Notes (Signed)
Chief Complaint  Patient presents with   balance    New patient referral: paper referral:  patient had covid end of august/2022 and now has balance issues and moves his mouth all the time. Room 61, wife Evan Stanley in room      [name] is a 80 y.o. male   Worsening gait abnormality  Since COVID infection in August 2022  Multiple stroke in the past,  Multiple vascular risk factor, hypertension, hyperlipidemia, diabetes, atrial fibrillation, history of stroke, history of right internal carotid artery endarterectomy, peripheral vascular disease, aortic valve replacement, previous smoker, aging,  MRI of the brain to rule out new stroke, involving right subthalamic nuclei   Already on anticoagulation Eliquis 5 mg twice a day  Dementia  Significant short-term memory loss, visual spatial disorientation,  I have suggested him to stop driving   DIAGNOSTIC DATA (LABS, IMAGING, TESTING) - I reviewed patient records, labs, notes, testing and imaging myself where available.  MRI in May 2017 1. Scattered foci of acute nonhemorrhagic infarction involving the posterior right frontal lobe, the right parietal lobe, in the anterior left frontal lobe. 2. Moderate age advanced atrophy and scattered white matter changes bilaterally compatible with chronic microvascular ischemic disease. 3. Abnormal signal compatible with occlusion in the left internal carotid proximal to the ophthalmic segment and in the distal right vertebral artery.  MRI in June 2020: 1.  No acute intracranial abnormality. 2. Stable since October 2017 including: - evidence of chronically occluded left ICA and distal right vertebral artery. - scattered small chronic infarcts in the cerebral cortex and white matter. - advanced cerebral volume loss.  CT Angiogram of neck and head in August 2021: 1. Negative CTA for emergent large vessel occlusion. 2. Chronic occlusion of the left ICA at its origin,  with distal reconstitution at the cavernous segment. 3. Sequelae of prior right carotid endarterectomy with wide patency of the right CCA and ICA. Severe stenosis versus occlusion involving the proximal right ECA as above. 4. Moderate to severe ostial stenosis at the origin of the right vertebral artery, with additional moderate to severe multifocal right V4 stenoses. The right vertebral artery essentially occludes prior to the vertebrobasilar junction. Dominant left vertebral artery patent without significant stenosis. 5. 50% stenosis involving the mid basilar artery.  Laboratory evaluation in 1157: Normal folic acid, vitamin D, ammonia level 28, normal vitamin B12, CMP showed elevated creatinine 1.9, GFR of 35, CBC  MEDICAL HISTORY:  Evan Stanley is a 80 year old male, seen in request by his primary care physician Dr. Lajuana Stanley, Evan Stanley for evaluation of memory loss, gait abnormality, initial evaluation was with his wife on April 14, 2021  I reviewed and summarized the referring note.PMHX. HTN DM since 2010 HLD Atrial Fibrillation. Stroke in Oct 2017, left side weakness, recovered well, was able to go back driving, golf Right endarterectomy January 13, 2016 Aortic valve replacement, Left eye, retinal artery occlusion in 2015, lost vision of left eye. Peripheral vascular disease. Smoker in the past,   He had a multiple stroke in the past, had multiple vascular risk factor, atrial fibrillation, diabetes, hypertension, hyperlipidemia, aortic valve replacement, has mild residual left-sided weakness, gait abnormality from previous stroke, already have some baseline gait abnormality  Wife noticed that patient has worsening gait abnormality memory loss since his COVID infection in August 2022, he was just hydrated, high fever for few days, had bad congestion, achy all over, but does not require hospitalization  Since then, he  was noted to have increased left-sided weakness, also  developed this flailed movement of left lower extremity, sometimes involving left upper extremity, also increased confusion, memory loss,  He retired from maintenance at age 78, prior to the COVID infection in August 2022, he was still able to drive short distance to golf course, play golf, now wife is worried about leaving him alone at  house for few hours,   PHYSICAL EXAM:   Vitals:   04/14/21 0857  BP: (!) 154/70  Pulse: 80  Weight: 173 lb (78.5 kg)  Height: 5' 5"  (1.651 m)   Not recorded     Body mass index is 28.79 kg/m.  PHYSICAL EXAMNIATION:  Gen: NAD, conversant, well nourised, well groomed                     Cardiovascular: Regular rate rhythm, no peripheral edema, warm, nontender. Eyes: Conjunctivae clear without exudates or hemorrhage Neck: Supple, no carotid bruits. Pulmonary: Clear to auscultation bilaterally   NEUROLOGICAL EXAM:  MENTAL STATUS: Speech:    Speech is normal; fluent and spontaneous with normal comprehension.  Cognition: Montreal Cognitive Assessment  04/14/2021  Visuospatial/ Executive (0/5) 1  Naming (0/3) 3  Attention: Read list of digits (0/2) 2  Attention: Read list of letters (0/1) 1  Attention: Serial 7 subtraction starting at 100 (0/3) 0  Language: Repeat phrase (0/2) 1  Language : Fluency (0/1) 0  Abstraction (0/2) 0  Delayed Recall (0/5) 0  Orientation (0/6) 5  Total 13        CRANIAL NERVES: CN II: Visual fields are full to confrontation. Pupils are round equal and briskly reactive to light. CN III, IV, VI: extraocular movement are normal. No ptosis. CN V: Facial sensation is intact to light touch CN VII: Face is symmetric with normal eye closure  CN VIII: Hearing is normal to causal conversation. CN IX, X: Phonation is normal. CN XI: Head turning and shoulder shrug are intact  MOTOR: Fixation of left upper extremity on rapid rotating movement, left lower extremity drift  REFLEXES: Hyperreflexia on the left  side  SENSORY: Intact to light touch, pinprick and vibratory sensation are intact in fingers and toes.  COORDINATION: There is no trunk or limb dysmetria noted.  GAIT/STANCE: Need push-up to get up from seated position, pointing right toes outwards, dragging left leg, wide-based, unsteady  REVIEW OF SYSTEMS:  Full 14 system review of systems performed and notable only for as above All other review of systems were negative.   ALLERGIES: Allergies  Allergen Reactions   Baclofen Other (See Comments)    Stroke like symptoms   Morphine And Related Other (See Comments)    Delirium     HOME MEDICATIONS: Current Outpatient Medications  Medication Sig Dispense Refill   acetaminophen (TYLENOL) 325 MG tablet Take 2 tablets (650 mg total) by mouth every 6 (six) hours as needed for mild pain (or Fever >/= 101).     atorvastatin (LIPITOR) 40 MG tablet TAKE 1 TABLET BY MOUTH  DAILY 90 tablet 1   atropine 1 % ophthalmic solution Place 1 drop into the left eye 2 (two) times daily. 3 mL 11   benzonatate (TESSALON PERLES) 100 MG capsule Take 1 capsule (100 mg total) by mouth 2 (two) times daily. 20 capsule 0   cetirizine (ZYRTEC) 10 MG tablet Take 1 tablet by mouth as needed for allergies.     Cholecalciferol 25 MCG (1000 UT) capsule Take 1 capsule by mouth daily.  cyanocobalamin 1000 MCG tablet Take 1,000 mcg by mouth daily.     Dulaglutide (TRULICITY) 1.5 UV/2.5DG SOPN Inject 1.5 mg into the skin once a week. Inject the contents of one pen once per week 6 mL 5   ELIQUIS 5 MG TABS tablet TAKE 1 TABLET BY MOUTH  TWICE DAILY 180 tablet 3   hydrochlorothiazide (HYDRODIURIL) 25 MG tablet TAKE 1 TABLET BY MOUTH  DAILY 90 tablet 3   insulin aspart (NOVOLOG FLEXPEN) 100 UNIT/ML FlexPen Inject 6 Units into the skin daily. With supper if blood sugar is greater than 150     insulin degludec (TRESIBA FLEXTOUCH) 200 UNIT/ML FlexTouch Pen Inject 50 Units into the skin daily. 15 mL 5   latanoprost  (XALATAN) 0.005 % ophthalmic solution Place 1 drop into the left eye at bedtime.      lisinopril (ZESTRIL) 20 MG tablet TAKE 1 TABLET BY MOUTH  DAILY 90 tablet 3   metoprolol succinate (TOPROL-XL) 100 MG 24 hr tablet TAKE 1 TABLET BY MOUTH  DAILY WITH OR IMMEDIATELY  FOLLOWING A MEAL 90 tablet 3   ONETOUCH ULTRA test strip CHECK BLOOD SUGAR TWICE  DAILY 200 strip 3   UNABLE TO FIND Diet: NAS and consistent carbohydrate     vitamin C (ASCORBIC ACID) 500 MG tablet Take 500 mg by mouth daily.     vitamin E 1000 UNIT capsule Take 1,000 Units by mouth daily.     No current facility-administered medications for this visit.    PAST MEDICAL HISTORY: Past Medical History:  Diagnosis Date   AKI (acute Stanley injury) ([facility])    Allergy    Anxiety    Arthritis    Right shoulder   Atrial fibrillation ([facility])    Carotid artery stenosis    Cataract    CHF (congestive heart failure) ([facility])    CVA (cerebral vascular accident) ([place]) 04/28/2016   Decreased vision    left eye   Diabetes mellitus without complication ([place])    Takes Metformin   Hyperlipidemia    Hypertension    Salmonella bacteremia 04/29/2016   Sepsis ([place]) 04/29/2016   Stroke ([facility]) 11/18/2015   no deficits   TIA (transient ischemic attack)    affected left eye   Urgency of urination     PAST SURGICAL HISTORY: Past Surgical History:  Procedure Laterality Date   AORTIC VALVE REPLACEMENT     BACK SURGERY  80s   CARDIAC VALVE REPLACEMENT  11/01/2008   21-mm Edwards Pericardial Magna - Ease valve   ENDARTERECTOMY Right 01/13/2016   Procedure: ENDARTERECTOMY CAROTID - RIGHT;  Surgeon: Conrad Kingston, MD;  Location: [facility];  Service: Vascular;  Laterality: Right;   EYE SURGERY     laser surgery, left eye  Left 10-29-2015    retinal surgery/ Deloria Lair MD    PERIPHERAL VASCULAR CATHETERIZATION Right 11/18/2015   Procedure: Carotid Angiography;  Surgeon: Conrad Hanover, MD;  Location: [facility];  Service: Cardiovascular;   Laterality: Right;   PERIPHERAL VASCULAR CATHETERIZATION N/A 11/18/2015   Procedure: Aortic Arch Angiography;  Surgeon: Conrad [place], MD;  Location: [facility];  Service: Cardiovascular;  Laterality: N/A;   TEE WITHOUT CARDIOVERSION N/A 05/05/2016   Procedure: TRANSESOPHAGEAL ECHOCARDIOGRAM (TEE);  Surgeon: Lelon Perla, MD;  Location[facility];  Service: Cardiovascular;  Laterality: N/A;    FAMILY HISTORY: Family History  Problem Relation Age of Onset   Heart disease Mother    Cancer Mother  breast   Stroke Father 33   Hypertension Father    Early death Sister        infant death   Heart disease Brother    Hydrocephalus Brother    Heart disease Brother    Diabetes Brother    Cancer Brother        prostat   Healthy Son    Healthy Son     SOCIAL HISTORY: Social History   Socioeconomic History   Marital status: Married    Spouse name: [place]   Number of children: 2   Years of education: 11   Highest education level: 11th grade  Occupational History   Occupation: maintenance    Employer: [employer]    Comment: Retired  Tobacco Use   Smoking status: Former    Packs/day: 2.00    Years: 20.00    Pack years: 40.00    Types: Cigarettes    Quit date: 07/20/1986    Years since quitting: 34.7   Smokeless tobacco: Never  Vaping Use   Vaping Use: Never used  Substance and Sexual Activity   Alcohol use: No    Alcohol/week: 0.0 standard drinks   Drug use: No   Sexual activity: Yes  Other Topics Concern   Not on file  Social History Narrative   Lives with wife in home   Right Handed   Drinks 6-7 cups caffeine daily   Social Determinants of Health   Financial Resource Strain: Not on file  Food Insecurity: Not on file  Transportation Needs: Not on file  Physical Activity: Not on file  Stress: Not on file  Social Connections: Not on file  Intimate Partner Violence: Not on file      Marcial Pacas, M.D. Ph.D.  [facility] [address] [place], [place] [address] Ph: [phone] Fax: [phone]  CC:  [name], DO [address],  [place] [address]  [name], DO

## 2021-04-22 NOTE — Telephone Encounter (Signed)
error 

## 2021-04-28 ENCOUNTER — Other Ambulatory Visit: Payer: Self-pay

## 2021-04-28 ENCOUNTER — Ambulatory Visit
Admission: RE | Admit: 2021-04-28 | Discharge: 2021-04-28 | Disposition: A | Discharge status: Home / Self Care | Payer: Medicare Other | Source: Ambulatory Visit | Attending: Neurology | Admitting: Neurology

## 2021-04-28 DIAGNOSIS — I639 Cerebral infarction, unspecified: Secondary | ICD-10-CM

## 2021-04-28 DIAGNOSIS — F0390 Unspecified dementia without behavioral disturbance: Secondary | ICD-10-CM | POA: Diagnosis not present

## 2021-04-28 DIAGNOSIS — R269 Unspecified abnormalities of gait and mobility: Secondary | ICD-10-CM | POA: Diagnosis not present

## 2021-04-28 NOTE — Telephone Encounter (Signed)
Pt assistance for 2 boxes of pen needles are ready for pickup

## 2021-04-30 ENCOUNTER — Telehealth: Payer: Self-pay | Admitting: Neurology

## 2021-04-30 NOTE — Telephone Encounter (Signed)
IMPRESSION: This MRI of the brain without contrast shows the following: 1.   Several foci of acute ischemic stroke noted in the posterior right frontal lobe apparent on diffusion-weighted imaging and new compared to the 03/11/2020 MRI 2.   Scattered chronic microvascular ischemic changes and several lacunar infarctions in the cerebral hemispheres, pons and left cerebellar hemisphere.  These were noted on the 03/11/2020 MRI. 3.   Occlusion of the left internal carotid artery and right vertebral artery, also seen on the previous MRI 4.   Moderate generalized cortical atrophy, slightly progressed compared to the previous MRI  Please call patient, there was evidence of active stroke at the right frontal lobe, which is new compared to previous imaging August 2021  Significant atrophy, small vessel disease,  Occlusion of left internal carotid artery, right vertebral artery,  Keep current medications, I will review imaging findings with him in January 2023 follow-up visit

## 2021-04-30 NOTE — Telephone Encounter (Signed)
I spoke to the patient's wife on DPR. She verbalized understanding of the MRI brain findings. She will have him continue his medications and keep his follow up appts.  She was inquiring about his home health referral. She is aware we will check on the status.

## 2021-04-30 NOTE — Telephone Encounter (Signed)
Patient called to advise that they are going to pick up pen needles at next visit 05/15/2021

## 2021-04-30 NOTE — Telephone Encounter (Signed)
Checking with [facility] to see if they can make an exception for this patient's referral if paired with another patient's [facility] referral. [employer] is not a good payor for [facility] therefore agencies will not accept

## 2021-04-30 NOTE — Telephone Encounter (Signed)
[place] with [facility] is able to accept this referral. They will call patient to start care.

## 2021-05-01 ENCOUNTER — Ambulatory Visit: Payer: Medicare Other | Admitting: Neurology

## 2021-05-06 ENCOUNTER — Ambulatory Visit (INDEPENDENT_AMBULATORY_CARE_PROVIDER_SITE_OTHER): Payer: Medicare Other | Admitting: Family Medicine

## 2021-05-06 ENCOUNTER — Telehealth: Payer: Self-pay | Admitting: Neurology

## 2021-05-06 ENCOUNTER — Other Ambulatory Visit: Payer: Self-pay

## 2021-05-06 ENCOUNTER — Encounter: Payer: Self-pay | Admitting: Family Medicine

## 2021-05-06 VITALS — BP 127/68 | HR 62 | Temp 98.4°F | Ht 65.0 in | Wt 172.0 lb

## 2021-05-06 DIAGNOSIS — N3941 Urge incontinence: Secondary | ICD-10-CM

## 2021-05-06 DIAGNOSIS — Z0000 Encounter for general adult medical examination without abnormal findings: Secondary | ICD-10-CM

## 2021-05-06 DIAGNOSIS — E1122 Type 2 diabetes mellitus with diabetic chronic kidney disease: Secondary | ICD-10-CM

## 2021-05-06 DIAGNOSIS — Z0001 Encounter for general adult medical examination with abnormal findings: Secondary | ICD-10-CM

## 2021-05-06 DIAGNOSIS — F418 Other specified anxiety disorders: Secondary | ICD-10-CM | POA: Diagnosis not present

## 2021-05-06 DIAGNOSIS — E1159 Type 2 diabetes mellitus with other circulatory complications: Secondary | ICD-10-CM

## 2021-05-06 DIAGNOSIS — N1832 Chronic kidney disease, stage 3b: Secondary | ICD-10-CM

## 2021-05-06 DIAGNOSIS — Z794 Long term (current) use of insulin: Secondary | ICD-10-CM | POA: Diagnosis not present

## 2021-05-06 DIAGNOSIS — I152 Hypertension secondary to endocrine disorders: Secondary | ICD-10-CM

## 2021-05-06 DIAGNOSIS — I69393 Ataxia following cerebral infarction: Secondary | ICD-10-CM | POA: Diagnosis not present

## 2021-05-06 LAB — BAYER DCA HB A1C WAIVED: HB A1C (BAYER DCA - WAIVED): 6.2 % — ABNORMAL HIGH (ref 4.8–5.6)

## 2021-05-06 NOTE — Telephone Encounter (Signed)
Message sent to [place] with [facility] to make sure patient has not missed their calls.

## 2021-05-06 NOTE — Progress Notes (Signed)
Evan Stanley is a 80 y.o. male presents to office today for annual physical exam examination.    Concerns today include: 1. DM Scheduled to see endocrinology next week.  Would like to go ahead and get his sugar checked today.  No reports of hypoglycemic episodes.  He will be due for fasting lipid panel soon.  He did unfortunately sustain a CVA since her last visit after he developed COVID-19.  No medication changes have been made but he is following up with his neurologist closely.  He apparently was supposed to have home health physical therapy and Occupational Therapy arranged.  His wife asked for [facility] as this is to the use with his previous stroke.  She admits that he has ongoing issues with balance and has sustained a couple of falls since his stroke.  He has a walker at home but primarily uses a cane.  She also feels that his speech is different.  2.  Mood issues She reports that he is moody.  Sometimes he has some anger outburst but then apologizes for it later.  He does not wish to be on any medications or seek any therapy for mood at this time.  He reports good sleep and if anything sleeps too much.  Appetite is good.  Occupation: Retired, Marital status: Married, Substance use: None Diet: Balanced, Exercise: None he is sedentary Last eye exam: Up-to-date Last colonoscopy: Up-to-date Refills needed today: None Immunizations needed: Immunization History  Administered Date(s) Administered   Fluad Quad(high Dose 65+) 05/23/2019, 04/25/2020   Influenza Split 04/27/2016   Influenza Whole 05/26/2010   Influenza, High Dose Seasonal PF 05/20/2017, 05/23/2018   Influenza,inj,Quad PF,6+ Mos 06/14/2013, 04/16/2014, 06/18/2015   Influenza-Unspecified 05/14/2016   Moderna Sars-Covid-2 Vaccination 08/16/2019, 09/13/2019, 07/04/2020   Pneumococcal Conjugate-13 06/18/2015   Pneumococcal Polysaccharide-23 12/16/2016   Tdap 03/05/2011     Past Medical History:  Diagnosis Date    AKI (acute kidney injury) ([place])    Allergy    Anxiety    Arthritis    Right shoulder   Atrial fibrillation (HCC)    Carotid artery stenosis    Cataract    CHF (congestive heart failure) (HCC)    CVA (cerebral vascular accident) ([place]) 04/28/2016   Decreased vision    left eye   Diabetes mellitus without complication ([place])    Takes Metformin   Hyperlipidemia    Hypertension    Salmonella bacteremia 04/29/2016   Sepsis ([place]) 04/29/2016   Stroke ([place]) 11/18/2015   no deficits   TIA (transient ischemic attack)    affected left eye   Urgency of urination    Social History   Socioeconomic History   Marital status: Married    Spouse name: [place]   Number of children: 2   Years of education: 11   Highest education level: 11th grade  Occupational History   Occupation: maintenance    Employer: [employer]    Comment: Retired  Tobacco Use   Smoking status: Former    Packs/day: 2.00    Years: 20.00    Pack years: 40.00    Types: Cigarettes    Quit date: 07/20/1986    Years since quitting: 34.8   Smokeless tobacco: Never  Vaping Use   Vaping Use: Never used  Substance and Sexual Activity   Alcohol use: No    Alcohol/week: 0.0 standard drinks   Drug use: No   Sexual activity: Yes  Other Topics Concern   Not on file  Social  History Narrative   Lives with wife in home   Right Handed   Drinks 6-7 cups caffeine daily   Social Determinants of Health   Financial Resource Strain: Not on file  Food Insecurity: Not on file  Transportation Needs: Not on file  Physical Activity: Not on file  Stress: Not on file  Social Connections: Not on file  Intimate Partner Violence: Not on file   Past Surgical History:  Procedure Laterality Date   AORTIC VALVE REPLACEMENT     BACK SURGERY  80s   CARDIAC VALVE REPLACEMENT  11/01/2008   21-mm Edwards Pericardial Magna - Ease valve   ENDARTERECTOMY Right 01/13/2016   Procedure: ENDARTERECTOMY CAROTID - RIGHT;  Surgeon: Conrad Chical, MD;   Location: [place];  Service: Vascular;  Laterality: Right;   EYE SURGERY     laser surgery, left eye  Left 10-29-2015    retinal surgery/ Deloria Lair MD    PERIPHERAL VASCULAR CATHETERIZATION Right 11/18/2015   Procedure: Carotid Angiography;  Surgeon: Conrad Layton, MD;  Location: [place] [facility];  Service: Cardiovascular;  Laterality: Right;   PERIPHERAL VASCULAR CATHETERIZATION N/A 11/18/2015   Procedure: Aortic Arch Angiography;  Surgeon: Conrad [employer], MD;  Location: [place] [facility];  Service: Cardiovascular;  Laterality: N/A;   TEE WITHOUT CARDIOVERSION N/A 05/05/2016   Procedure: TRANSESOPHAGEAL ECHOCARDIOGRAM (TEE);  Surgeon: Lelon Perla, MD;  Location: [facility] ENDOSCOPY;  Service: Cardiovascular;  Laterality: N/A;   Family History  Problem Relation Age of Onset   Heart disease Mother    Cancer Mother        breast   Stroke Father 55   Hypertension Father    Early death Sister        infant death   Heart disease Brother    Hydrocephalus Brother    Heart disease Brother    Diabetes Brother    Cancer Brother        prostat   Healthy Son    Healthy Son     Current Outpatient Medications:    acetaminophen (TYLENOL) 325 MG tablet, Take 2 tablets (650 mg total) by mouth every 6 (six) hours as needed for mild pain (or Fever >/= 101)., Disp: , Rfl:    atorvastatin (LIPITOR) 40 MG tablet, TAKE 1 TABLET BY MOUTH  DAILY, Disp: 90 tablet, Rfl: 1   atropine 1 % ophthalmic solution, Place 1 drop into the left eye 2 (two) times daily., Disp: 3 mL, Rfl: 11   benzonatate (TESSALON PERLES) 100 MG capsule, Take 1 capsule (100 mg total) by mouth 2 (two) times daily., Disp: 20 capsule, Rfl: 0   cetirizine (ZYRTEC) 10 MG tablet, Take 1 tablet by mouth as needed for allergies., Disp: , Rfl:    Cholecalciferol 25 MCG (1000 UT) capsule, Take 1 capsule by mouth daily., Disp: , Rfl:    cyanocobalamin 1000 MCG tablet, Take 1,000 mcg by mouth daily., Disp: , Rfl:    Dulaglutide (TRULICITY) 1.5  VQ/0.0QQ SOPN, Inject 1.5 mg into the skin once a week. Inject the contents of one pen once per week, Disp: 6 mL, Rfl: 5   ELIQUIS 5 MG TABS tablet, TAKE 1 TABLET BY MOUTH  TWICE DAILY, Disp: 180 tablet, Rfl: 3   hydrochlorothiazide (HYDRODIURIL) 25 MG tablet, TAKE 1 TABLET BY MOUTH  DAILY, Disp: 90 tablet, Rfl: 3   insulin aspart (NOVOLOG FLEXPEN) 100 UNIT/ML FlexPen, Inject 6 Units into the skin daily. With supper if blood sugar is greater  than 150, Disp: , Rfl:    insulin degludec (TRESIBA FLEXTOUCH) 200 UNIT/ML FlexTouch Pen, Inject 50 Units into the skin daily., Disp: 15 mL, Rfl: 5   latanoprost (XALATAN) 0.005 % ophthalmic solution, Place 1 drop into the left eye at bedtime. , Disp: , Rfl:    lisinopril (ZESTRIL) 20 MG tablet, TAKE 1 TABLET BY MOUTH  DAILY, Disp: 90 tablet, Rfl: 3   metoprolol succinate (TOPROL-XL) 100 MG 24 hr tablet, TAKE 1 TABLET BY MOUTH  DAILY WITH OR IMMEDIATELY  FOLLOWING A MEAL, Disp: 90 tablet, Rfl: 3   ONETOUCH ULTRA test strip, CHECK BLOOD SUGAR TWICE  DAILY, Disp: 200 strip, Rfl: 3   UNABLE TO FIND, Diet: NAS and consistent carbohydrate, Disp: , Rfl:    vitamin C (ASCORBIC ACID) 500 MG tablet, Take 500 mg by mouth daily., Disp: , Rfl:    vitamin E 1000 UNIT capsule, Take 1,000 Units by mouth daily., Disp: , Rfl:   Allergies  Allergen Reactions   Baclofen Other (See Comments)    Stroke like symptoms   Morphine And Related Other (See Comments)    Delirium      ROS: Review of Systems Pertinent items noted in HPI and remainder of comprehensive ROS otherwise negative.    Physical exam BP 127/68   Pulse 62   Temp 98.4 F (36.9 C)   Ht 5' 5"  (1.651 m)   Wt 172 lb (78 kg)   SpO2 99%   BMI 28.62 kg/m  General appearance: alert, cooperative, appears stated age, and no distress Head: atraumatic Eyes:  Left pupil dilated with minimal pupillary response.  Right pupil reactive and round Ears: normal TM's and external ear canals both ears Nose: Nares normal.  Septum midline. Mucosa normal. No drainage or sinus tenderness. Throat: lips, mucosa, and tongue normal; teeth and gums normal and symmetric rise of palate.  Has some tongue fasciculations noted at rest Neck: no adenopathy, supple, symmetrical, trachea midline, and thyroid not enlarged, symmetric, no tenderness/mass/nodules Back: symmetric, no curvature. ROM normal. No CVA tenderness. Lungs: clear to auscultation bilaterally Chest wall: no tenderness Heart: regular rate and rhythm, S1, S2 normal, no murmur, click, rub or gallop Abdomen: soft, non-tender; bowel sounds normal; no masses,  no organomegaly Extremities: extremities normal, atraumatic, no cyanosis or edema Pulses: 2+ and symmetric Skin: Skin color, texture, turgor normal. No rashes or lesions Lymph nodes: Cervical, supraclavicular, and axillary nodes normal. Neurologic: Ataxic gait.  Requires cane for ambulation.  Tongue fasciculations noted.  Pupillary dilation as above.  I felt that he had some nasolabial flattening on the right side of his face. Psych: Patient pleasant, interactive.  Depression screen [facility] 2/9 05/06/2021 04/04/2021 09/20/2020  Decreased Interest 3 0 0  Down, Depressed, Hopeless 0 0 0  PHQ - 2 Score 3 0 0  Altered sleeping 3 1 0  Tired, decreased energy 0 1 0  Change in appetite 0 1 0  Feeling bad or failure about yourself  0 0 0  Trouble concentrating 3 0 0  Moving slowly or fidgety/restless 0 1 0  Suicidal thoughts - 0 0  PHQ-9 Score 9 4 0  Difficult doing work/chores Very difficult - -  Some recent data might be hidden   GAD 7 : Generalized Anxiety Score 05/06/2021 04/04/2021  Nervous, Anxious, on Edge 3 1  Control/stop worrying 0 0  Worry too much - different things 0 0  Trouble relaxing 3 1  Restless 3 1  Easily annoyed or irritable 3  1  Afraid - awful might happen 0 0  Total GAD 7 Score 12 4  Anxiety Difficulty Very difficult -    Assessment/ Plan: Virgel Bouquet here for annual physical exam.    Annual physical exam  Type 2 diabetes mellitus with stage 3b chronic kidney disease, with long-term current use of insulin ([place]) - Plan: Bayer DCA Hb A1c Waived  Hypertension associated with diabetes ([place])  Ataxia due to recent stroke - Plan: Ambulatory referral to [place]  Urge incontinence of urine  Depression with anxiety  Sugar shows control.  I CCed the results to his endocrinologist.  My plan is to have him have fasting lipid at his next visit.  Blood pressure was at goal.  No changes.  However, he has had some frequent urination and urge incontinence.  His wife is interested in potentially discontinuing the diuretic.  Ongoing reach out to his nephrologist to make sure this is okay from their aspect.  If we need to advance his blood pressure regimen we can increase lisinopril if needed  Certainly has some ataxia on exam.  I also noticed some tongue fasciculations.  Uncertain if this would be from his most recent stroke.  I cannot appreciate any medications that might be inducing but will CC to neurologist for their input as well.  Face-to-face was completed today and we will get him plugged into home health for physical therapy and Occupational Therapy.  His depression and anxiety scores are elevated today.  I offered initiation of medication versus counseling services but he declined both.  We will reassess this again at his next visit Breckin Savannah M. [name], DO

## 2021-05-06 NOTE — Telephone Encounter (Signed)
Pt's wife, Winfred Iiams (on [place]) called, have not heard from anyone to schedule his home therapy. Would like a call from the nurse.  Gave [facility]'s phone number.

## 2021-05-06 NOTE — Patient Instructions (Addendum)
Come fasting for labs We talked about Mirtazapine for your mood/ anxiety

## 2021-05-09 ENCOUNTER — Other Ambulatory Visit: Payer: Self-pay | Admitting: Family Medicine

## 2021-05-09 DIAGNOSIS — E785 Hyperlipidemia, unspecified: Secondary | ICD-10-CM

## 2021-05-09 DIAGNOSIS — E1169 Type 2 diabetes mellitus with other specified complication: Secondary | ICD-10-CM

## 2021-05-12 DIAGNOSIS — E1151 Type 2 diabetes mellitus with diabetic peripheral angiopathy without gangrene: Secondary | ICD-10-CM | POA: Diagnosis not present

## 2021-05-12 DIAGNOSIS — Z952 Presence of prosthetic heart valve: Secondary | ICD-10-CM | POA: Diagnosis not present

## 2021-05-12 DIAGNOSIS — Z8616 Personal history of COVID-19: Secondary | ICD-10-CM | POA: Diagnosis not present

## 2021-05-12 DIAGNOSIS — E1159 Type 2 diabetes mellitus with other circulatory complications: Secondary | ICD-10-CM | POA: Diagnosis not present

## 2021-05-12 DIAGNOSIS — I509 Heart failure, unspecified: Secondary | ICD-10-CM | POA: Diagnosis not present

## 2021-05-12 DIAGNOSIS — Z87891 Personal history of nicotine dependence: Secondary | ICD-10-CM | POA: Diagnosis not present

## 2021-05-12 DIAGNOSIS — Z9181 History of falling: Secondary | ICD-10-CM | POA: Diagnosis not present

## 2021-05-12 DIAGNOSIS — M19011 Primary osteoarthritis, right shoulder: Secondary | ICD-10-CM | POA: Diagnosis not present

## 2021-05-12 DIAGNOSIS — Z7901 Long term (current) use of anticoagulants: Secondary | ICD-10-CM | POA: Diagnosis not present

## 2021-05-12 DIAGNOSIS — Z7985 Long-term (current) use of injectable non-insulin antidiabetic drugs: Secondary | ICD-10-CM | POA: Diagnosis not present

## 2021-05-12 DIAGNOSIS — I4891 Unspecified atrial fibrillation: Secondary | ICD-10-CM | POA: Diagnosis not present

## 2021-05-12 DIAGNOSIS — E785 Hyperlipidemia, unspecified: Secondary | ICD-10-CM | POA: Diagnosis not present

## 2021-05-12 DIAGNOSIS — E1122 Type 2 diabetes mellitus with diabetic chronic kidney disease: Secondary | ICD-10-CM | POA: Diagnosis not present

## 2021-05-12 DIAGNOSIS — N1832 Chronic kidney disease, stage 3b: Secondary | ICD-10-CM | POA: Diagnosis not present

## 2021-05-12 DIAGNOSIS — Z794 Long term (current) use of insulin: Secondary | ICD-10-CM | POA: Diagnosis not present

## 2021-05-12 DIAGNOSIS — I69354 Hemiplegia and hemiparesis following cerebral infarction affecting left non-dominant side: Secondary | ICD-10-CM | POA: Diagnosis not present

## 2021-05-12 DIAGNOSIS — I69393 Ataxia following cerebral infarction: Secondary | ICD-10-CM | POA: Diagnosis not present

## 2021-05-12 DIAGNOSIS — I152 Hypertension secondary to endocrine disorders: Secondary | ICD-10-CM | POA: Diagnosis not present

## 2021-05-14 ENCOUNTER — Other Ambulatory Visit: Payer: Self-pay

## 2021-05-14 ENCOUNTER — Ambulatory Visit (INDEPENDENT_AMBULATORY_CARE_PROVIDER_SITE_OTHER): Payer: Medicare Other | Admitting: Endocrinology

## 2021-05-14 ENCOUNTER — Encounter: Payer: Self-pay | Admitting: Endocrinology

## 2021-05-14 VITALS — BP 116/64 | HR 63 | Ht 65.0 in | Wt 171.0 lb

## 2021-05-14 DIAGNOSIS — Z794 Long term (current) use of insulin: Secondary | ICD-10-CM

## 2021-05-14 DIAGNOSIS — E1165 Type 2 diabetes mellitus with hyperglycemia: Secondary | ICD-10-CM | POA: Diagnosis not present

## 2021-05-14 NOTE — Patient Instructions (Addendum)
Check blood sugars on waking up 3-4 days a week  Also check blood sugars about 2 hours after meals and do this after different meals by rotation  Recommended blood sugar levels on waking up are 90-130 and about 2 hours after meal is 130-160  Please bring your blood sugar monitor to each visit, thank you  Take 52 Tresiba  If sugar at 10-11 am is >160 add 6 Humalog in am

## 2021-05-14 NOTE — Progress Notes (Signed)
Patient ID: Evan Stanley, male   DOB: 27-Oct-1940, 81 y.o.   MRN: [id number]          Reason for Appointment: Follow-up for Type 2 Diabetes    History of Present Illness:          Date of diagnosis of type 2 diabetes mellitus: 2010       Background history:   He thinks he was diagnosed to have diabetes after his coronary bypass surgery and blood sugars are not very high He had been on metformin until about 05/2017 Also at some point had taken glimepiride Usually appears to have had A1c just above 7%  Recent history:   INSULIN regimen is: [place]  55  units daily, Novolog 6-10 units at Bfst and dinner     Non-insulin hypoglycemic drugs the patient is taking are: Trulicity 1.5 mg weekly  His A1c is much improved at 6.2 compared to 7.8 in July   Current management, blood sugar patterns and problems identified: He has taken 55 units of Tresiba instead of 50 as directed on his last visit in July With this his blood sugars overall are lower especially fasting Despite reminders he does not check his readings after meals His wife is helping him with insulin doses and she has not given any NovoLog in the morning at breakfast since his blood sugars are near normal He is still eating some carbohydrate like waffles in the morning at breakfast along with some protein He does have sporadically high readings in the late afternoon occasionally from eating more carbohydrates or cookies Unable to do any exercise No hypoglycemia with lowest reading 81 Has been getting his Trulicity and insulin from patient assistance programs, taking Trulicity regularly every week        Side effects from medications have been: None  Glucose monitoring:  done about 2 times a day         Glucometer: One Touch.       Blood Glucose readings by review of home monitor download show   PRE-MEAL Fasting Lunch Dinner Bedtime Overall  Glucose range: 81-129      Mean/median: 102    118   POST-MEAL PC Breakfast PC  Lunch PC Dinner  Glucose range:   81-213  Mean/median:   139   Previously:  PRE-MEAL Fasting Lunch Dinner Bedtime Overall  Glucose range: 103-185      Mean/median: 142    133   POST-MEAL PC Breakfast PC Lunch PC Dinner  Glucose range:   80-229  Mean/median:   125      Self-care: The diet that the patient has been following is: tries to limit sweets.     Meal times are:  Breakfast is at after 9 AM, dinner around 6 PM  Typical meal intake: Breakfast is eggs and meat and toast, sometimes waffles            Dietician visit, most recent: 2019                Weight history:  Wt Readings from Last 3 Encounters:  05/14/21 171 lb (77.6 kg)  05/06/21 172 lb (78 kg)  04/14/21 173 lb (78.5 kg)    Glycemic control:   Lab Results  Component Value Date   HGBA1C 6.2 (H) 05/06/2021   HGBA1C 7.8 (A) 02/12/2021   HGBA1C 7.3 (A) 11/11/2020   Lab Results  Component Value Date   MICROALBUR 14.4 (H) 08/12/2020   LDLCALC 47 12/25/2019   CREATININE 1.91 (  H) 04/04/2021   Lab Results  Component Value Date   MICRALBCREAT 13.5 08/12/2020    Lab Results  Component Value Date   FRUCTOSAMINE 274 08/12/2020   FRUCTOSAMINE 323 (H) 04/22/2018   Lab Results  Component Value Date   HGB 15.0 04/04/2021      Allergies as of 05/14/2021       Reactions   Baclofen Other (See Comments)   Stroke like symptoms   Morphine And Related Other (See Comments)   Delirium         Medication List        Accurate as of May 14, 2021  9:03 PM. If you have any questions, ask your nurse or doctor.          acetaminophen 325 MG tablet Commonly known as: TYLENOL Take 2 tablets (650 mg total) by mouth every 6 (six) hours as needed for mild pain (or Fever >/= 101).   atorvastatin 40 MG tablet Commonly known as: LIPITOR TAKE 1 TABLET BY MOUTH  DAILY   atropine 1 % ophthalmic solution Place 1 drop into the left eye 2 (two) times daily.   cetirizine 10 MG tablet Commonly known as:  ZYRTEC Take 1 tablet by mouth as needed for allergies.   Cholecalciferol 25 MCG (1000 UT) capsule Take 1 capsule by mouth daily.   cyanocobalamin 1000 MCG tablet Take 1,000 mcg by mouth daily.   Eliquis 5 MG Tabs tablet Generic drug: apixaban TAKE 1 TABLET BY MOUTH  TWICE DAILY   hydrochlorothiazide 25 MG tablet Commonly known as: HYDRODIURIL TAKE 1 TABLET BY MOUTH  DAILY   latanoprost 0.005 % ophthalmic solution Commonly known as: XALATAN Place 1 drop into the left eye at bedtime.   lisinopril 20 MG tablet Commonly known as: ZESTRIL TAKE 1 TABLET BY MOUTH  DAILY   metoprolol succinate 100 MG 24 hr tablet Commonly known as: TOPROL-XL TAKE 1 TABLET BY MOUTH  DAILY WITH OR IMMEDIATELY  FOLLOWING A MEAL   NovoLOG FlexPen 100 UNIT/ML FlexPen Generic drug: insulin aspart Inject 6 Units into the skin daily. With supper if blood sugar is greater than 150   OneTouch Ultra test strip Generic drug: glucose blood CHECK BLOOD SUGAR TWICE  DAILY   Tresiba FlexTouch 200 UNIT/ML FlexTouch Pen Generic drug: insulin degludec Inject 50 Units into the skin daily.   Trulicity 1.5 VC/9.4WH Sopn Generic drug: Dulaglutide Inject 1.5 mg into the skin once a week. Inject the contents of one pen once per week   UNABLE TO FIND Diet: NAS and consistent carbohydrate   vitamin C 500 MG tablet Commonly known as: ASCORBIC ACID Take 500 mg by mouth daily.   vitamin E 1000 UNIT capsule Take 1,000 Units by mouth daily.        Allergies:  Allergies  Allergen Reactions   Baclofen Other (See Comments)    Stroke like symptoms   Morphine And Related Other (See Comments)    Delirium     Past Medical History:  Diagnosis Date   AKI (acute kidney injury) ([place])    Allergy    Anxiety    Arthritis    Right shoulder   Atrial fibrillation (HCC)    Carotid artery stenosis    Cataract    CHF (congestive heart failure) (HCC)    CVA (cerebral vascular accident) ([place]) 04/28/2016   Decreased  vision    left eye   Diabetes mellitus without complication ([place])    Takes Metformin   Hyperlipidemia  Hypertension    Salmonella bacteremia 04/29/2016   Sepsis ([place]) 04/29/2016   Stroke ([place]) 11/18/2015   no deficits   TIA (transient ischemic attack)    affected left eye   Urgency of urination     Past Surgical History:  Procedure Laterality Date   AORTIC VALVE REPLACEMENT     BACK SURGERY  80s   CARDIAC VALVE REPLACEMENT  11/01/2008   21-mm Edwards Pericardial Magna - Ease valve   ENDARTERECTOMY Right 01/13/2016   Procedure: ENDARTERECTOMY CAROTID - RIGHT;  Surgeon: Conrad Calico Rock, MD;  Location: [place];  Service: Vascular;  Laterality: Right;   EYE SURGERY     laser surgery, left eye  Left 10-29-2015    retinal surgery/ Deloria Lair MD    PERIPHERAL VASCULAR CATHETERIZATION Right 11/18/2015   Procedure: Carotid Angiography;  Surgeon: Conrad Fairfield, MD;  Location: [place] [facility];  Service: Cardiovascular;  Laterality: Right;   PERIPHERAL VASCULAR CATHETERIZATION N/A 11/18/2015   Procedure: Aortic Arch Angiography;  Surgeon: Conrad [place]ville, MD;  Location: [place] [facility];  Service: Cardiovascular;  Laterality: N/A;   TEE WITHOUT CARDIOVERSION N/A 05/05/2016   Procedure: TRANSESOPHAGEAL ECHOCARDIOGRAM (TEE);  Surgeon: Lelon Perla, MD;  [facility] ENDOSCOPY;  Service: Cardiovascular;  Laterality: N/A;    Family History  Problem Relation Age of Onset   Heart disease Mother    Cancer Mother        breast   Stroke Father 60   Hypertension Father    Early death Sister        infant death   Heart disease Brother    Hydrocephalus Brother    Heart disease Brother    Diabetes Brother    Cancer Brother        prostat   Healthy Son    Healthy Son     Social History:  reports that he quit smoking about 34 years ago. His smoking use included cigarettes. He has a 40.00 pack-year smoking history. He has never used smokeless tobacco. He reports that he does not drink alcohol  and does not use drugs.   Review of Systems   Lipid history: Lipids controlled well on atorvastatin 40 mg daily prescribed by his PCP   Has history of CAD and CVD    Lab Results  Component Value Date   CHOL 89 12/25/2019   HDL 30 (L) 12/25/2019   LDLCALC 47 12/25/2019   LDLDIRECT 62 02/16/2017   TRIG 60 12/25/2019   CHOLHDL 3.0 12/25/2019           Hypertension:  His treatment includes hydrochlorothiazide 25 mg and lisinopril 20 mg daily,   prescribed by PCP Also monitors at home   BP Readings from Last 3 Encounters:  05/14/21 116/64  05/06/21 127/68  04/14/21 (!) 154/70     CKD: Creatinine variable as follows Periodically seen by nephrologist  Lab Results  Component Value Date   CREATININE 1.91 (H) 04/04/2021   CREATININE 1.90 (H) 03/18/2021   CREATININE 1.71 (H) 08/12/2020     Physical Examination:  BP 116/64   Pulse 63   Ht 5' 5"  (1.651 m)   Wt 171 lb (77.6 kg)   SpO2 98%   BMI 28.46 kg/m       ASSESSMENT:  Diabetes type 2, on insulin   See history of present illness for detailed discussion of current diabetes management, blood sugar patterns and problems identified  His A1c is much improved at 6.2 which is  his lowest level  He is on basal bolus insulin regimen and Trulicity 1.5 mg weekly  He is mostly requiring basal insulin and with increasing his [name] on the last visit his sugars are better without hypoglycemia However his median reading in the morning is now below 100  Although he is taking insulin at suppertime for mealtime coverage he is not taking any in the morning as he is afraid of low sugars from taking Humalog when his blood sugars are normal Unable to verify his blood sugars after breakfast as these are not monitored no hypoglycemia  Fasting readings are generally higher but may be sometimes related to taking snacks late at night when he is blood sugars are low normal postprandially  HYPERTENSION: Well-controlled    Renal  insufficiency: Stable, continue follow-up with nephrologist   PLAN:   He will reduce Tresiba down to 52 units for now to avoid tendency to low sugars Continue 1.5 Trulicity weekly No change in suppertime Humalog He likely needs to take NovoLog for breakfast also but will have him check his sugars after breakfast a couple of times to see how much it is  If his sugars after breakfast are at least 160 he will start taking 4-6 units of NovoLog at breakfast Start monitoring blood sugars by rotation before breakfast 1 day and after breakfast the next day He was advised about the freestyle libre system but he refuses to do this    Patient Instructions  Check blood sugars on waking up 3-4 days a week  Also check blood sugars about 2 hours after meals and do this after different meals by rotation  Recommended blood sugar levels on waking up are 90-130 and about 2 hours after meal is 130-160  Please bring your blood sugar monitor to each visit, thank you  Take 52 Tresiba  If sugar at 10-11 am is >160 add 6 Humalog in am       [name] 05/14/2021, 9:03 PM   Note: This office note was prepared with Dragon voice recognition system technology. Any transcriptional errors that result from this process are unintentional.

## 2021-05-15 ENCOUNTER — Ambulatory Visit: Payer: Medicare Other | Admitting: Endocrinology

## 2021-05-15 ENCOUNTER — Ambulatory Visit: Payer: Medicare Other | Admitting: Adult Health

## 2021-05-16 ENCOUNTER — Telehealth: Payer: Self-pay

## 2021-05-16 DIAGNOSIS — I4891 Unspecified atrial fibrillation: Secondary | ICD-10-CM | POA: Diagnosis not present

## 2021-05-16 DIAGNOSIS — E1151 Type 2 diabetes mellitus with diabetic peripheral angiopathy without gangrene: Secondary | ICD-10-CM | POA: Diagnosis not present

## 2021-05-16 DIAGNOSIS — I509 Heart failure, unspecified: Secondary | ICD-10-CM | POA: Diagnosis not present

## 2021-05-16 DIAGNOSIS — Z794 Long term (current) use of insulin: Secondary | ICD-10-CM | POA: Diagnosis not present

## 2021-05-16 DIAGNOSIS — Z9181 History of falling: Secondary | ICD-10-CM | POA: Diagnosis not present

## 2021-05-16 DIAGNOSIS — I152 Hypertension secondary to endocrine disorders: Secondary | ICD-10-CM | POA: Diagnosis not present

## 2021-05-16 DIAGNOSIS — Z87891 Personal history of nicotine dependence: Secondary | ICD-10-CM | POA: Diagnosis not present

## 2021-05-16 DIAGNOSIS — E785 Hyperlipidemia, unspecified: Secondary | ICD-10-CM | POA: Diagnosis not present

## 2021-05-16 DIAGNOSIS — E1122 Type 2 diabetes mellitus with diabetic chronic kidney disease: Secondary | ICD-10-CM | POA: Diagnosis not present

## 2021-05-16 DIAGNOSIS — E1159 Type 2 diabetes mellitus with other circulatory complications: Secondary | ICD-10-CM | POA: Diagnosis not present

## 2021-05-16 DIAGNOSIS — Z7985 Long-term (current) use of injectable non-insulin antidiabetic drugs: Secondary | ICD-10-CM | POA: Diagnosis not present

## 2021-05-16 DIAGNOSIS — Z952 Presence of prosthetic heart valve: Secondary | ICD-10-CM | POA: Diagnosis not present

## 2021-05-16 DIAGNOSIS — Z7901 Long term (current) use of anticoagulants: Secondary | ICD-10-CM | POA: Diagnosis not present

## 2021-05-16 DIAGNOSIS — I69393 Ataxia following cerebral infarction: Secondary | ICD-10-CM | POA: Diagnosis not present

## 2021-05-16 DIAGNOSIS — I69354 Hemiplegia and hemiparesis following cerebral infarction affecting left non-dominant side: Secondary | ICD-10-CM | POA: Diagnosis not present

## 2021-05-16 DIAGNOSIS — M19011 Primary osteoarthritis, right shoulder: Secondary | ICD-10-CM | POA: Diagnosis not present

## 2021-05-16 DIAGNOSIS — Z8616 Personal history of COVID-19: Secondary | ICD-10-CM | POA: Diagnosis not present

## 2021-05-16 DIAGNOSIS — N1832 Chronic kidney disease, stage 3b: Secondary | ICD-10-CM | POA: Diagnosis not present

## 2021-05-16 NOTE — Telephone Encounter (Signed)
Left message regarding re-enrollment for patient assistance medications.   Call back [phone]

## 2021-05-19 DIAGNOSIS — I509 Heart failure, unspecified: Secondary | ICD-10-CM | POA: Diagnosis not present

## 2021-05-19 DIAGNOSIS — I69393 Ataxia following cerebral infarction: Secondary | ICD-10-CM | POA: Diagnosis not present

## 2021-05-19 DIAGNOSIS — E1122 Type 2 diabetes mellitus with diabetic chronic kidney disease: Secondary | ICD-10-CM | POA: Diagnosis not present

## 2021-05-19 DIAGNOSIS — Z7985 Long-term (current) use of injectable non-insulin antidiabetic drugs: Secondary | ICD-10-CM | POA: Diagnosis not present

## 2021-05-19 DIAGNOSIS — Z794 Long term (current) use of insulin: Secondary | ICD-10-CM | POA: Diagnosis not present

## 2021-05-19 DIAGNOSIS — Z8616 Personal history of COVID-19: Secondary | ICD-10-CM | POA: Diagnosis not present

## 2021-05-19 DIAGNOSIS — E1151 Type 2 diabetes mellitus with diabetic peripheral angiopathy without gangrene: Secondary | ICD-10-CM | POA: Diagnosis not present

## 2021-05-19 DIAGNOSIS — N1832 Chronic kidney disease, stage 3b: Secondary | ICD-10-CM | POA: Diagnosis not present

## 2021-05-19 DIAGNOSIS — Z952 Presence of prosthetic heart valve: Secondary | ICD-10-CM | POA: Diagnosis not present

## 2021-05-19 DIAGNOSIS — M19011 Primary osteoarthritis, right shoulder: Secondary | ICD-10-CM | POA: Diagnosis not present

## 2021-05-19 DIAGNOSIS — Z7901 Long term (current) use of anticoagulants: Secondary | ICD-10-CM | POA: Diagnosis not present

## 2021-05-19 DIAGNOSIS — Z87891 Personal history of nicotine dependence: Secondary | ICD-10-CM | POA: Diagnosis not present

## 2021-05-19 DIAGNOSIS — Z9181 History of falling: Secondary | ICD-10-CM | POA: Diagnosis not present

## 2021-05-19 DIAGNOSIS — I152 Hypertension secondary to endocrine disorders: Secondary | ICD-10-CM | POA: Diagnosis not present

## 2021-05-19 DIAGNOSIS — I4891 Unspecified atrial fibrillation: Secondary | ICD-10-CM | POA: Diagnosis not present

## 2021-05-19 DIAGNOSIS — E785 Hyperlipidemia, unspecified: Secondary | ICD-10-CM | POA: Diagnosis not present

## 2021-05-19 DIAGNOSIS — I69354 Hemiplegia and hemiparesis following cerebral infarction affecting left non-dominant side: Secondary | ICD-10-CM | POA: Diagnosis not present

## 2021-05-19 DIAGNOSIS — E1159 Type 2 diabetes mellitus with other circulatory complications: Secondary | ICD-10-CM | POA: Diagnosis not present

## 2021-05-21 DIAGNOSIS — E1159 Type 2 diabetes mellitus with other circulatory complications: Secondary | ICD-10-CM | POA: Diagnosis not present

## 2021-05-21 DIAGNOSIS — I509 Heart failure, unspecified: Secondary | ICD-10-CM | POA: Diagnosis not present

## 2021-05-21 DIAGNOSIS — I152 Hypertension secondary to endocrine disorders: Secondary | ICD-10-CM | POA: Diagnosis not present

## 2021-05-21 DIAGNOSIS — Z8616 Personal history of COVID-19: Secondary | ICD-10-CM | POA: Diagnosis not present

## 2021-05-21 DIAGNOSIS — E1151 Type 2 diabetes mellitus with diabetic peripheral angiopathy without gangrene: Secondary | ICD-10-CM | POA: Diagnosis not present

## 2021-05-21 DIAGNOSIS — I4891 Unspecified atrial fibrillation: Secondary | ICD-10-CM | POA: Diagnosis not present

## 2021-05-21 DIAGNOSIS — M19011 Primary osteoarthritis, right shoulder: Secondary | ICD-10-CM | POA: Diagnosis not present

## 2021-05-21 DIAGNOSIS — Z7985 Long-term (current) use of injectable non-insulin antidiabetic drugs: Secondary | ICD-10-CM | POA: Diagnosis not present

## 2021-05-21 DIAGNOSIS — E785 Hyperlipidemia, unspecified: Secondary | ICD-10-CM | POA: Diagnosis not present

## 2021-05-21 DIAGNOSIS — Z87891 Personal history of nicotine dependence: Secondary | ICD-10-CM | POA: Diagnosis not present

## 2021-05-21 DIAGNOSIS — N1832 Chronic kidney disease, stage 3b: Secondary | ICD-10-CM | POA: Diagnosis not present

## 2021-05-21 DIAGNOSIS — Z7901 Long term (current) use of anticoagulants: Secondary | ICD-10-CM | POA: Diagnosis not present

## 2021-05-21 DIAGNOSIS — E1122 Type 2 diabetes mellitus with diabetic chronic kidney disease: Secondary | ICD-10-CM | POA: Diagnosis not present

## 2021-05-21 DIAGNOSIS — Z9181 History of falling: Secondary | ICD-10-CM | POA: Diagnosis not present

## 2021-05-21 DIAGNOSIS — I69393 Ataxia following cerebral infarction: Secondary | ICD-10-CM | POA: Diagnosis not present

## 2021-05-21 DIAGNOSIS — I69354 Hemiplegia and hemiparesis following cerebral infarction affecting left non-dominant side: Secondary | ICD-10-CM | POA: Diagnosis not present

## 2021-05-21 DIAGNOSIS — Z794 Long term (current) use of insulin: Secondary | ICD-10-CM | POA: Diagnosis not present

## 2021-05-21 DIAGNOSIS — Z952 Presence of prosthetic heart valve: Secondary | ICD-10-CM | POA: Diagnosis not present

## 2021-05-21 NOTE — Telephone Encounter (Signed)
Patients wife returned phone call. Would prefer to have husbands re-enrollment applications for [employer] and [employer] mailed to their home.

## 2021-05-23 DIAGNOSIS — N1832 Chronic kidney disease, stage 3b: Secondary | ICD-10-CM | POA: Diagnosis not present

## 2021-05-23 DIAGNOSIS — E1151 Type 2 diabetes mellitus with diabetic peripheral angiopathy without gangrene: Secondary | ICD-10-CM | POA: Diagnosis not present

## 2021-05-23 DIAGNOSIS — I4891 Unspecified atrial fibrillation: Secondary | ICD-10-CM | POA: Diagnosis not present

## 2021-05-23 DIAGNOSIS — E1122 Type 2 diabetes mellitus with diabetic chronic kidney disease: Secondary | ICD-10-CM | POA: Diagnosis not present

## 2021-05-23 DIAGNOSIS — M19011 Primary osteoarthritis, right shoulder: Secondary | ICD-10-CM | POA: Diagnosis not present

## 2021-05-23 DIAGNOSIS — I69354 Hemiplegia and hemiparesis following cerebral infarction affecting left non-dominant side: Secondary | ICD-10-CM | POA: Diagnosis not present

## 2021-05-23 DIAGNOSIS — Z7985 Long-term (current) use of injectable non-insulin antidiabetic drugs: Secondary | ICD-10-CM | POA: Diagnosis not present

## 2021-05-23 DIAGNOSIS — Z794 Long term (current) use of insulin: Secondary | ICD-10-CM | POA: Diagnosis not present

## 2021-05-23 DIAGNOSIS — I509 Heart failure, unspecified: Secondary | ICD-10-CM | POA: Diagnosis not present

## 2021-05-23 DIAGNOSIS — I152 Hypertension secondary to endocrine disorders: Secondary | ICD-10-CM | POA: Diagnosis not present

## 2021-05-23 DIAGNOSIS — E785 Hyperlipidemia, unspecified: Secondary | ICD-10-CM | POA: Diagnosis not present

## 2021-05-23 DIAGNOSIS — E1159 Type 2 diabetes mellitus with other circulatory complications: Secondary | ICD-10-CM | POA: Diagnosis not present

## 2021-05-23 DIAGNOSIS — Z952 Presence of prosthetic heart valve: Secondary | ICD-10-CM | POA: Diagnosis not present

## 2021-05-23 DIAGNOSIS — I69393 Ataxia following cerebral infarction: Secondary | ICD-10-CM | POA: Diagnosis not present

## 2021-05-23 DIAGNOSIS — Z8616 Personal history of COVID-19: Secondary | ICD-10-CM | POA: Diagnosis not present

## 2021-05-23 DIAGNOSIS — Z7901 Long term (current) use of anticoagulants: Secondary | ICD-10-CM | POA: Diagnosis not present

## 2021-05-23 DIAGNOSIS — Z87891 Personal history of nicotine dependence: Secondary | ICD-10-CM | POA: Diagnosis not present

## 2021-05-23 DIAGNOSIS — Z9181 History of falling: Secondary | ICD-10-CM | POA: Diagnosis not present

## 2021-05-27 ENCOUNTER — Other Ambulatory Visit: Payer: Self-pay

## 2021-05-27 ENCOUNTER — Ambulatory Visit (INDEPENDENT_AMBULATORY_CARE_PROVIDER_SITE_OTHER): Payer: Medicare Other

## 2021-05-27 DIAGNOSIS — I4891 Unspecified atrial fibrillation: Secondary | ICD-10-CM | POA: Diagnosis not present

## 2021-05-27 DIAGNOSIS — E1151 Type 2 diabetes mellitus with diabetic peripheral angiopathy without gangrene: Secondary | ICD-10-CM

## 2021-05-27 DIAGNOSIS — N1832 Chronic kidney disease, stage 3b: Secondary | ICD-10-CM

## 2021-05-27 DIAGNOSIS — E1122 Type 2 diabetes mellitus with diabetic chronic kidney disease: Secondary | ICD-10-CM

## 2021-05-27 DIAGNOSIS — I152 Hypertension secondary to endocrine disorders: Secondary | ICD-10-CM

## 2021-05-27 DIAGNOSIS — E1159 Type 2 diabetes mellitus with other circulatory complications: Secondary | ICD-10-CM

## 2021-05-27 DIAGNOSIS — I509 Heart failure, unspecified: Secondary | ICD-10-CM

## 2021-05-27 DIAGNOSIS — I69354 Hemiplegia and hemiparesis following cerebral infarction affecting left non-dominant side: Secondary | ICD-10-CM | POA: Diagnosis not present

## 2021-05-27 DIAGNOSIS — I69393 Ataxia following cerebral infarction: Secondary | ICD-10-CM | POA: Diagnosis not present

## 2021-05-27 DIAGNOSIS — M19011 Primary osteoarthritis, right shoulder: Secondary | ICD-10-CM

## 2021-05-27 DIAGNOSIS — E785 Hyperlipidemia, unspecified: Secondary | ICD-10-CM | POA: Diagnosis not present

## 2021-05-27 DIAGNOSIS — Z952 Presence of prosthetic heart valve: Secondary | ICD-10-CM

## 2021-05-27 DIAGNOSIS — F0390 Unspecified dementia without behavioral disturbance: Secondary | ICD-10-CM

## 2021-05-27 DIAGNOSIS — F418 Other specified anxiety disorders: Secondary | ICD-10-CM

## 2021-05-29 DIAGNOSIS — Z7901 Long term (current) use of anticoagulants: Secondary | ICD-10-CM | POA: Diagnosis not present

## 2021-05-29 DIAGNOSIS — E1151 Type 2 diabetes mellitus with diabetic peripheral angiopathy without gangrene: Secondary | ICD-10-CM | POA: Diagnosis not present

## 2021-05-29 DIAGNOSIS — Z87891 Personal history of nicotine dependence: Secondary | ICD-10-CM | POA: Diagnosis not present

## 2021-05-29 DIAGNOSIS — I69393 Ataxia following cerebral infarction: Secondary | ICD-10-CM | POA: Diagnosis not present

## 2021-05-29 DIAGNOSIS — I4891 Unspecified atrial fibrillation: Secondary | ICD-10-CM | POA: Diagnosis not present

## 2021-05-29 DIAGNOSIS — Z8616 Personal history of COVID-19: Secondary | ICD-10-CM | POA: Diagnosis not present

## 2021-05-29 DIAGNOSIS — Z794 Long term (current) use of insulin: Secondary | ICD-10-CM | POA: Diagnosis not present

## 2021-05-29 DIAGNOSIS — Z952 Presence of prosthetic heart valve: Secondary | ICD-10-CM | POA: Diagnosis not present

## 2021-05-29 DIAGNOSIS — Z9181 History of falling: Secondary | ICD-10-CM | POA: Diagnosis not present

## 2021-05-29 DIAGNOSIS — I152 Hypertension secondary to endocrine disorders: Secondary | ICD-10-CM | POA: Diagnosis not present

## 2021-05-29 DIAGNOSIS — E1159 Type 2 diabetes mellitus with other circulatory complications: Secondary | ICD-10-CM | POA: Diagnosis not present

## 2021-05-29 DIAGNOSIS — M19011 Primary osteoarthritis, right shoulder: Secondary | ICD-10-CM | POA: Diagnosis not present

## 2021-05-29 DIAGNOSIS — I509 Heart failure, unspecified: Secondary | ICD-10-CM | POA: Diagnosis not present

## 2021-05-29 DIAGNOSIS — N1832 Chronic kidney disease, stage 3b: Secondary | ICD-10-CM | POA: Diagnosis not present

## 2021-05-29 DIAGNOSIS — E1122 Type 2 diabetes mellitus with diabetic chronic kidney disease: Secondary | ICD-10-CM | POA: Diagnosis not present

## 2021-05-29 DIAGNOSIS — I69354 Hemiplegia and hemiparesis following cerebral infarction affecting left non-dominant side: Secondary | ICD-10-CM | POA: Diagnosis not present

## 2021-05-29 DIAGNOSIS — E785 Hyperlipidemia, unspecified: Secondary | ICD-10-CM | POA: Diagnosis not present

## 2021-05-29 DIAGNOSIS — Z7985 Long-term (current) use of injectable non-insulin antidiabetic drugs: Secondary | ICD-10-CM | POA: Diagnosis not present

## 2021-06-02 DIAGNOSIS — Z952 Presence of prosthetic heart valve: Secondary | ICD-10-CM | POA: Diagnosis not present

## 2021-06-02 DIAGNOSIS — I69393 Ataxia following cerebral infarction: Secondary | ICD-10-CM | POA: Diagnosis not present

## 2021-06-02 DIAGNOSIS — E1122 Type 2 diabetes mellitus with diabetic chronic kidney disease: Secondary | ICD-10-CM | POA: Diagnosis not present

## 2021-06-02 DIAGNOSIS — I152 Hypertension secondary to endocrine disorders: Secondary | ICD-10-CM | POA: Diagnosis not present

## 2021-06-02 DIAGNOSIS — Z794 Long term (current) use of insulin: Secondary | ICD-10-CM | POA: Diagnosis not present

## 2021-06-02 DIAGNOSIS — E785 Hyperlipidemia, unspecified: Secondary | ICD-10-CM | POA: Diagnosis not present

## 2021-06-02 DIAGNOSIS — Z7985 Long-term (current) use of injectable non-insulin antidiabetic drugs: Secondary | ICD-10-CM | POA: Diagnosis not present

## 2021-06-02 DIAGNOSIS — I4891 Unspecified atrial fibrillation: Secondary | ICD-10-CM | POA: Diagnosis not present

## 2021-06-02 DIAGNOSIS — Z9181 History of falling: Secondary | ICD-10-CM | POA: Diagnosis not present

## 2021-06-02 DIAGNOSIS — E1151 Type 2 diabetes mellitus with diabetic peripheral angiopathy without gangrene: Secondary | ICD-10-CM | POA: Diagnosis not present

## 2021-06-02 DIAGNOSIS — I69354 Hemiplegia and hemiparesis following cerebral infarction affecting left non-dominant side: Secondary | ICD-10-CM | POA: Diagnosis not present

## 2021-06-02 DIAGNOSIS — Z87891 Personal history of nicotine dependence: Secondary | ICD-10-CM | POA: Diagnosis not present

## 2021-06-02 DIAGNOSIS — Z7901 Long term (current) use of anticoagulants: Secondary | ICD-10-CM | POA: Diagnosis not present

## 2021-06-02 DIAGNOSIS — M19011 Primary osteoarthritis, right shoulder: Secondary | ICD-10-CM | POA: Diagnosis not present

## 2021-06-02 DIAGNOSIS — I509 Heart failure, unspecified: Secondary | ICD-10-CM | POA: Diagnosis not present

## 2021-06-02 DIAGNOSIS — E1159 Type 2 diabetes mellitus with other circulatory complications: Secondary | ICD-10-CM | POA: Diagnosis not present

## 2021-06-02 DIAGNOSIS — N1832 Chronic kidney disease, stage 3b: Secondary | ICD-10-CM | POA: Diagnosis not present

## 2021-06-02 DIAGNOSIS — Z8616 Personal history of COVID-19: Secondary | ICD-10-CM | POA: Diagnosis not present

## 2021-06-05 ENCOUNTER — Telehealth: Payer: Self-pay | Admitting: Family Medicine

## 2021-06-06 ENCOUNTER — Telehealth: Payer: Self-pay | Admitting: Family Medicine

## 2021-06-06 DIAGNOSIS — Z7985 Long-term (current) use of injectable non-insulin antidiabetic drugs: Secondary | ICD-10-CM | POA: Diagnosis not present

## 2021-06-06 DIAGNOSIS — E1159 Type 2 diabetes mellitus with other circulatory complications: Secondary | ICD-10-CM | POA: Diagnosis not present

## 2021-06-06 DIAGNOSIS — I69354 Hemiplegia and hemiparesis following cerebral infarction affecting left non-dominant side: Secondary | ICD-10-CM | POA: Diagnosis not present

## 2021-06-06 DIAGNOSIS — M19011 Primary osteoarthritis, right shoulder: Secondary | ICD-10-CM | POA: Diagnosis not present

## 2021-06-06 DIAGNOSIS — I509 Heart failure, unspecified: Secondary | ICD-10-CM | POA: Diagnosis not present

## 2021-06-06 DIAGNOSIS — N1832 Chronic kidney disease, stage 3b: Secondary | ICD-10-CM | POA: Diagnosis not present

## 2021-06-06 DIAGNOSIS — I4891 Unspecified atrial fibrillation: Secondary | ICD-10-CM | POA: Diagnosis not present

## 2021-06-06 DIAGNOSIS — Z9181 History of falling: Secondary | ICD-10-CM | POA: Diagnosis not present

## 2021-06-06 DIAGNOSIS — Z8616 Personal history of COVID-19: Secondary | ICD-10-CM | POA: Diagnosis not present

## 2021-06-06 DIAGNOSIS — Z794 Long term (current) use of insulin: Secondary | ICD-10-CM | POA: Diagnosis not present

## 2021-06-06 DIAGNOSIS — I152 Hypertension secondary to endocrine disorders: Secondary | ICD-10-CM | POA: Diagnosis not present

## 2021-06-06 DIAGNOSIS — Z87891 Personal history of nicotine dependence: Secondary | ICD-10-CM | POA: Diagnosis not present

## 2021-06-06 DIAGNOSIS — E785 Hyperlipidemia, unspecified: Secondary | ICD-10-CM | POA: Diagnosis not present

## 2021-06-06 DIAGNOSIS — E1122 Type 2 diabetes mellitus with diabetic chronic kidney disease: Secondary | ICD-10-CM | POA: Diagnosis not present

## 2021-06-06 DIAGNOSIS — E1151 Type 2 diabetes mellitus with diabetic peripheral angiopathy without gangrene: Secondary | ICD-10-CM | POA: Diagnosis not present

## 2021-06-06 DIAGNOSIS — Z7901 Long term (current) use of anticoagulants: Secondary | ICD-10-CM | POA: Diagnosis not present

## 2021-06-06 DIAGNOSIS — Z952 Presence of prosthetic heart valve: Secondary | ICD-10-CM | POA: Diagnosis not present

## 2021-06-06 DIAGNOSIS — I69393 Ataxia following cerebral infarction: Secondary | ICD-10-CM | POA: Diagnosis not present

## 2021-06-06 NOTE — Telephone Encounter (Signed)
LMTCB

## 2021-06-09 NOTE — Telephone Encounter (Signed)
Explained to wife, this requires detailed F2F OV notes, he has an appt w/ Dr. [name] on 07/02/21 they will wait till then, I will hold onto ppw

## 2021-06-09 NOTE — Telephone Encounter (Signed)
Discussed w/ wife this morning 06/09/21 in another encounter about ppw for liftchair

## 2021-06-17 DIAGNOSIS — E1159 Type 2 diabetes mellitus with other circulatory complications: Secondary | ICD-10-CM | POA: Diagnosis not present

## 2021-06-17 DIAGNOSIS — E785 Hyperlipidemia, unspecified: Secondary | ICD-10-CM | POA: Diagnosis not present

## 2021-06-17 DIAGNOSIS — E1122 Type 2 diabetes mellitus with diabetic chronic kidney disease: Secondary | ICD-10-CM | POA: Diagnosis not present

## 2021-06-17 DIAGNOSIS — Z8616 Personal history of COVID-19: Secondary | ICD-10-CM | POA: Diagnosis not present

## 2021-06-17 DIAGNOSIS — I69393 Ataxia following cerebral infarction: Secondary | ICD-10-CM | POA: Diagnosis not present

## 2021-06-17 DIAGNOSIS — Z952 Presence of prosthetic heart valve: Secondary | ICD-10-CM | POA: Diagnosis not present

## 2021-06-17 DIAGNOSIS — Z9181 History of falling: Secondary | ICD-10-CM | POA: Diagnosis not present

## 2021-06-17 DIAGNOSIS — Z7901 Long term (current) use of anticoagulants: Secondary | ICD-10-CM | POA: Diagnosis not present

## 2021-06-17 DIAGNOSIS — I69354 Hemiplegia and hemiparesis following cerebral infarction affecting left non-dominant side: Secondary | ICD-10-CM | POA: Diagnosis not present

## 2021-06-17 DIAGNOSIS — Z87891 Personal history of nicotine dependence: Secondary | ICD-10-CM | POA: Diagnosis not present

## 2021-06-17 DIAGNOSIS — Z7985 Long-term (current) use of injectable non-insulin antidiabetic drugs: Secondary | ICD-10-CM | POA: Diagnosis not present

## 2021-06-17 DIAGNOSIS — I509 Heart failure, unspecified: Secondary | ICD-10-CM | POA: Diagnosis not present

## 2021-06-17 DIAGNOSIS — E1151 Type 2 diabetes mellitus with diabetic peripheral angiopathy without gangrene: Secondary | ICD-10-CM | POA: Diagnosis not present

## 2021-06-17 DIAGNOSIS — Z794 Long term (current) use of insulin: Secondary | ICD-10-CM | POA: Diagnosis not present

## 2021-06-17 DIAGNOSIS — M19011 Primary osteoarthritis, right shoulder: Secondary | ICD-10-CM | POA: Diagnosis not present

## 2021-06-17 DIAGNOSIS — N1832 Chronic kidney disease, stage 3b: Secondary | ICD-10-CM | POA: Diagnosis not present

## 2021-06-17 DIAGNOSIS — I4891 Unspecified atrial fibrillation: Secondary | ICD-10-CM | POA: Diagnosis not present

## 2021-06-17 DIAGNOSIS — I152 Hypertension secondary to endocrine disorders: Secondary | ICD-10-CM | POA: Diagnosis not present

## 2021-06-20 DIAGNOSIS — Z794 Long term (current) use of insulin: Secondary | ICD-10-CM | POA: Diagnosis not present

## 2021-06-20 DIAGNOSIS — Z952 Presence of prosthetic heart valve: Secondary | ICD-10-CM | POA: Diagnosis not present

## 2021-06-20 DIAGNOSIS — Z7985 Long-term (current) use of injectable non-insulin antidiabetic drugs: Secondary | ICD-10-CM | POA: Diagnosis not present

## 2021-06-20 DIAGNOSIS — E1151 Type 2 diabetes mellitus with diabetic peripheral angiopathy without gangrene: Secondary | ICD-10-CM | POA: Diagnosis not present

## 2021-06-20 DIAGNOSIS — Z8616 Personal history of COVID-19: Secondary | ICD-10-CM | POA: Diagnosis not present

## 2021-06-20 DIAGNOSIS — M19011 Primary osteoarthritis, right shoulder: Secondary | ICD-10-CM | POA: Diagnosis not present

## 2021-06-20 DIAGNOSIS — I509 Heart failure, unspecified: Secondary | ICD-10-CM | POA: Diagnosis not present

## 2021-06-20 DIAGNOSIS — E785 Hyperlipidemia, unspecified: Secondary | ICD-10-CM | POA: Diagnosis not present

## 2021-06-20 DIAGNOSIS — Z87891 Personal history of nicotine dependence: Secondary | ICD-10-CM | POA: Diagnosis not present

## 2021-06-20 DIAGNOSIS — E1159 Type 2 diabetes mellitus with other circulatory complications: Secondary | ICD-10-CM | POA: Diagnosis not present

## 2021-06-20 DIAGNOSIS — I152 Hypertension secondary to endocrine disorders: Secondary | ICD-10-CM | POA: Diagnosis not present

## 2021-06-20 DIAGNOSIS — I69393 Ataxia following cerebral infarction: Secondary | ICD-10-CM | POA: Diagnosis not present

## 2021-06-20 DIAGNOSIS — Z9181 History of falling: Secondary | ICD-10-CM | POA: Diagnosis not present

## 2021-06-20 DIAGNOSIS — E1122 Type 2 diabetes mellitus with diabetic chronic kidney disease: Secondary | ICD-10-CM | POA: Diagnosis not present

## 2021-06-20 DIAGNOSIS — Z7901 Long term (current) use of anticoagulants: Secondary | ICD-10-CM | POA: Diagnosis not present

## 2021-06-20 DIAGNOSIS — I69354 Hemiplegia and hemiparesis following cerebral infarction affecting left non-dominant side: Secondary | ICD-10-CM | POA: Diagnosis not present

## 2021-06-20 DIAGNOSIS — I4891 Unspecified atrial fibrillation: Secondary | ICD-10-CM | POA: Diagnosis not present

## 2021-06-20 DIAGNOSIS — N1832 Chronic kidney disease, stage 3b: Secondary | ICD-10-CM | POA: Diagnosis not present

## 2021-06-23 DIAGNOSIS — E785 Hyperlipidemia, unspecified: Secondary | ICD-10-CM | POA: Diagnosis not present

## 2021-06-23 DIAGNOSIS — Z9181 History of falling: Secondary | ICD-10-CM | POA: Diagnosis not present

## 2021-06-23 DIAGNOSIS — Z7985 Long-term (current) use of injectable non-insulin antidiabetic drugs: Secondary | ICD-10-CM | POA: Diagnosis not present

## 2021-06-23 DIAGNOSIS — I4891 Unspecified atrial fibrillation: Secondary | ICD-10-CM | POA: Diagnosis not present

## 2021-06-23 DIAGNOSIS — E1151 Type 2 diabetes mellitus with diabetic peripheral angiopathy without gangrene: Secondary | ICD-10-CM | POA: Diagnosis not present

## 2021-06-23 DIAGNOSIS — Z8616 Personal history of COVID-19: Secondary | ICD-10-CM | POA: Diagnosis not present

## 2021-06-23 DIAGNOSIS — Z794 Long term (current) use of insulin: Secondary | ICD-10-CM | POA: Diagnosis not present

## 2021-06-23 DIAGNOSIS — I509 Heart failure, unspecified: Secondary | ICD-10-CM | POA: Diagnosis not present

## 2021-06-23 DIAGNOSIS — M19011 Primary osteoarthritis, right shoulder: Secondary | ICD-10-CM | POA: Diagnosis not present

## 2021-06-23 DIAGNOSIS — I69393 Ataxia following cerebral infarction: Secondary | ICD-10-CM | POA: Diagnosis not present

## 2021-06-23 DIAGNOSIS — Z952 Presence of prosthetic heart valve: Secondary | ICD-10-CM | POA: Diagnosis not present

## 2021-06-23 DIAGNOSIS — N1832 Chronic kidney disease, stage 3b: Secondary | ICD-10-CM | POA: Diagnosis not present

## 2021-06-23 DIAGNOSIS — Z7901 Long term (current) use of anticoagulants: Secondary | ICD-10-CM | POA: Diagnosis not present

## 2021-06-23 DIAGNOSIS — E1122 Type 2 diabetes mellitus with diabetic chronic kidney disease: Secondary | ICD-10-CM | POA: Diagnosis not present

## 2021-06-23 DIAGNOSIS — E1159 Type 2 diabetes mellitus with other circulatory complications: Secondary | ICD-10-CM | POA: Diagnosis not present

## 2021-06-23 DIAGNOSIS — Z87891 Personal history of nicotine dependence: Secondary | ICD-10-CM | POA: Diagnosis not present

## 2021-06-23 DIAGNOSIS — I152 Hypertension secondary to endocrine disorders: Secondary | ICD-10-CM | POA: Diagnosis not present

## 2021-06-23 DIAGNOSIS — I69354 Hemiplegia and hemiparesis following cerebral infarction affecting left non-dominant side: Secondary | ICD-10-CM | POA: Diagnosis not present

## 2021-06-26 DIAGNOSIS — I69354 Hemiplegia and hemiparesis following cerebral infarction affecting left non-dominant side: Secondary | ICD-10-CM | POA: Diagnosis not present

## 2021-06-26 DIAGNOSIS — E1122 Type 2 diabetes mellitus with diabetic chronic kidney disease: Secondary | ICD-10-CM | POA: Diagnosis not present

## 2021-06-26 DIAGNOSIS — Z9181 History of falling: Secondary | ICD-10-CM | POA: Diagnosis not present

## 2021-06-26 DIAGNOSIS — Z952 Presence of prosthetic heart valve: Secondary | ICD-10-CM | POA: Diagnosis not present

## 2021-06-26 DIAGNOSIS — I4891 Unspecified atrial fibrillation: Secondary | ICD-10-CM | POA: Diagnosis not present

## 2021-06-26 DIAGNOSIS — I509 Heart failure, unspecified: Secondary | ICD-10-CM | POA: Diagnosis not present

## 2021-06-26 DIAGNOSIS — E1159 Type 2 diabetes mellitus with other circulatory complications: Secondary | ICD-10-CM | POA: Diagnosis not present

## 2021-06-26 DIAGNOSIS — Z8616 Personal history of COVID-19: Secondary | ICD-10-CM | POA: Diagnosis not present

## 2021-06-26 DIAGNOSIS — M19011 Primary osteoarthritis, right shoulder: Secondary | ICD-10-CM | POA: Diagnosis not present

## 2021-06-26 DIAGNOSIS — E1151 Type 2 diabetes mellitus with diabetic peripheral angiopathy without gangrene: Secondary | ICD-10-CM | POA: Diagnosis not present

## 2021-06-26 DIAGNOSIS — Z7985 Long-term (current) use of injectable non-insulin antidiabetic drugs: Secondary | ICD-10-CM | POA: Diagnosis not present

## 2021-06-26 DIAGNOSIS — Z7901 Long term (current) use of anticoagulants: Secondary | ICD-10-CM | POA: Diagnosis not present

## 2021-06-26 DIAGNOSIS — E785 Hyperlipidemia, unspecified: Secondary | ICD-10-CM | POA: Diagnosis not present

## 2021-06-26 DIAGNOSIS — N1832 Chronic kidney disease, stage 3b: Secondary | ICD-10-CM | POA: Diagnosis not present

## 2021-06-26 DIAGNOSIS — I69393 Ataxia following cerebral infarction: Secondary | ICD-10-CM | POA: Diagnosis not present

## 2021-06-26 DIAGNOSIS — Z794 Long term (current) use of insulin: Secondary | ICD-10-CM | POA: Diagnosis not present

## 2021-06-26 DIAGNOSIS — Z87891 Personal history of nicotine dependence: Secondary | ICD-10-CM | POA: Diagnosis not present

## 2021-06-26 DIAGNOSIS — I152 Hypertension secondary to endocrine disorders: Secondary | ICD-10-CM | POA: Diagnosis not present

## 2021-06-30 DIAGNOSIS — Z952 Presence of prosthetic heart valve: Secondary | ICD-10-CM | POA: Diagnosis not present

## 2021-06-30 DIAGNOSIS — M19011 Primary osteoarthritis, right shoulder: Secondary | ICD-10-CM | POA: Diagnosis not present

## 2021-06-30 DIAGNOSIS — E1151 Type 2 diabetes mellitus with diabetic peripheral angiopathy without gangrene: Secondary | ICD-10-CM | POA: Diagnosis not present

## 2021-06-30 DIAGNOSIS — E785 Hyperlipidemia, unspecified: Secondary | ICD-10-CM | POA: Diagnosis not present

## 2021-06-30 DIAGNOSIS — E1159 Type 2 diabetes mellitus with other circulatory complications: Secondary | ICD-10-CM | POA: Diagnosis not present

## 2021-06-30 DIAGNOSIS — I509 Heart failure, unspecified: Secondary | ICD-10-CM | POA: Diagnosis not present

## 2021-06-30 DIAGNOSIS — I4891 Unspecified atrial fibrillation: Secondary | ICD-10-CM | POA: Diagnosis not present

## 2021-06-30 DIAGNOSIS — I152 Hypertension secondary to endocrine disorders: Secondary | ICD-10-CM | POA: Diagnosis not present

## 2021-06-30 DIAGNOSIS — Z9181 History of falling: Secondary | ICD-10-CM | POA: Diagnosis not present

## 2021-06-30 DIAGNOSIS — Z8616 Personal history of COVID-19: Secondary | ICD-10-CM | POA: Diagnosis not present

## 2021-06-30 DIAGNOSIS — Z7985 Long-term (current) use of injectable non-insulin antidiabetic drugs: Secondary | ICD-10-CM | POA: Diagnosis not present

## 2021-06-30 DIAGNOSIS — Z7901 Long term (current) use of anticoagulants: Secondary | ICD-10-CM | POA: Diagnosis not present

## 2021-06-30 DIAGNOSIS — I69393 Ataxia following cerebral infarction: Secondary | ICD-10-CM | POA: Diagnosis not present

## 2021-06-30 DIAGNOSIS — E1122 Type 2 diabetes mellitus with diabetic chronic kidney disease: Secondary | ICD-10-CM | POA: Diagnosis not present

## 2021-06-30 DIAGNOSIS — I69354 Hemiplegia and hemiparesis following cerebral infarction affecting left non-dominant side: Secondary | ICD-10-CM | POA: Diagnosis not present

## 2021-06-30 DIAGNOSIS — Z87891 Personal history of nicotine dependence: Secondary | ICD-10-CM | POA: Diagnosis not present

## 2021-06-30 DIAGNOSIS — Z794 Long term (current) use of insulin: Secondary | ICD-10-CM | POA: Diagnosis not present

## 2021-06-30 DIAGNOSIS — N1832 Chronic kidney disease, stage 3b: Secondary | ICD-10-CM | POA: Diagnosis not present

## 2021-07-02 ENCOUNTER — Ambulatory Visit (INDEPENDENT_AMBULATORY_CARE_PROVIDER_SITE_OTHER): Payer: Medicare Other | Admitting: Family Medicine

## 2021-07-02 ENCOUNTER — Encounter: Payer: Self-pay | Admitting: Family Medicine

## 2021-07-02 VITALS — BP 129/71 | HR 64 | Temp 98.2°F | Ht 65.0 in | Wt 173.0 lb

## 2021-07-02 DIAGNOSIS — N1832 Chronic kidney disease, stage 3b: Secondary | ICD-10-CM | POA: Diagnosis not present

## 2021-07-02 DIAGNOSIS — E1159 Type 2 diabetes mellitus with other circulatory complications: Secondary | ICD-10-CM | POA: Diagnosis not present

## 2021-07-02 DIAGNOSIS — E1122 Type 2 diabetes mellitus with diabetic chronic kidney disease: Secondary | ICD-10-CM | POA: Diagnosis not present

## 2021-07-02 DIAGNOSIS — R809 Proteinuria, unspecified: Secondary | ICD-10-CM | POA: Diagnosis not present

## 2021-07-02 DIAGNOSIS — E1129 Type 2 diabetes mellitus with other diabetic kidney complication: Secondary | ICD-10-CM | POA: Diagnosis not present

## 2021-07-02 DIAGNOSIS — I69393 Ataxia following cerebral infarction: Secondary | ICD-10-CM

## 2021-07-02 DIAGNOSIS — I129 Hypertensive chronic kidney disease with stage 1 through stage 4 chronic kidney disease, or unspecified chronic kidney disease: Secondary | ICD-10-CM | POA: Diagnosis not present

## 2021-07-02 DIAGNOSIS — Z794 Long term (current) use of insulin: Secondary | ICD-10-CM | POA: Diagnosis not present

## 2021-07-02 DIAGNOSIS — I152 Hypertension secondary to endocrine disorders: Secondary | ICD-10-CM | POA: Diagnosis not present

## 2021-07-02 DIAGNOSIS — N189 Chronic kidney disease, unspecified: Secondary | ICD-10-CM | POA: Diagnosis not present

## 2021-07-02 LAB — BAYER DCA HB A1C WAIVED: HB A1C (BAYER DCA - WAIVED): 6.5 % — ABNORMAL HIGH (ref 4.8–5.6)

## 2021-07-02 NOTE — Progress Notes (Signed)
Subjective: CC:DM PCP: [name], DO Evan Stanley is a 80 y.o. male presenting to clinic today for:  1. Type 2 Diabetes with hypertension, hyperlipidemia and CKD:  Compliant with insulin, Trulicity.  Denies any hypoglycemic episodes.  Lowest blood sugar was 80s.  He is applying for patient assistance for the insulin.  Considered [facility] but they worry about extra cost associated with this.  He continues to see Dr. Willette Alma for checkups.  Is seeing Dr. Theador Hawthorne, his nephrologist, next week.  Neurology is also scheduled soon given history of CVA  Last eye exam: Up-to-date Last foot exam: Up-to-date Last A1c:  Lab Results  Component Value Date   HGBA1C 6.2 (H) 05/06/2021   Nephropathy screen indicated?:  Has known CKD Last flu, zoster and/or pneumovax:  Immunization History  Administered Date(s) Administered   Fluad Quad(high Dose 65+) 05/23/2019, 04/25/2020   Influenza Split 04/27/2016   Influenza Whole 05/26/2010   Influenza, High Dose Seasonal PF 05/20/2017, 05/23/2018   Influenza,inj,Quad PF,6+ Mos 06/14/2013, 04/16/2014, 06/18/2015   Influenza-Unspecified 05/14/2016, 05/20/2017, 05/23/2018   Moderna Sars-Covid-2 Vaccination 08/16/2019, 09/13/2019, 07/04/2020   Pneumococcal Conjugate-13 06/18/2015   Pneumococcal Polysaccharide-23 12/16/2016   Tdap 03/05/2011    ROS: No chest pain, shortness of breath  2.  Gait abnormality Patient with history of CVA causing a gait abnormality.  He has been working closely with [facility] physical [facility] and he does feel that this is helping.  However, he still has issues with balance where he will get up and fall backwards.  He is utilizing a cane for ambulation.  He still drags his left foot.  ROS: Per HPI  Allergies  Allergen Reactions   Baclofen Other (See Comments)    Stroke like symptoms   Morphine And Related Other (See Comments)    Delirium    Past Medical History:  Diagnosis Date   AKI (acute  kidney injury) ([place])    Allergy    Anxiety    Arthritis    Right shoulder   Atrial fibrillation (HCC)    Carotid artery stenosis    Cataract    CHF (congestive heart failure) (HCC)    CVA (cerebral vascular accident) ([place]) 04/28/2016   Decreased vision    left eye   Diabetes mellitus without complication ([place])    Takes Metformin   Hyperlipidemia    Hypertension    Salmonella bacteremia 04/29/2016   Sepsis ([place]) 04/29/2016   Stroke ([place]) 11/18/2015   no deficits   TIA (transient ischemic attack)    affected left eye   Urgency of urination     Current Outpatient Medications:    acetaminophen (TYLENOL) 325 MG tablet, Take 2 tablets (650 mg total) by mouth every 6 (six) hours as needed for mild pain (or Fever >/= 101)., Disp: , Rfl:    atorvastatin (LIPITOR) 40 MG tablet, TAKE 1 TABLET BY MOUTH  DAILY, Disp: 90 tablet, Rfl: 0   atropine 1 % ophthalmic solution, Place 1 drop into the left eye 2 (two) times daily., Disp: 3 mL, Rfl: 11   cetirizine (ZYRTEC) 10 MG tablet, Take 1 tablet by mouth as needed for allergies., Disp: , Rfl:    Cholecalciferol 25 MCG (1000 UT) capsule, Take 1 capsule by mouth daily., Disp: , Rfl:    cyanocobalamin 1000 MCG tablet, Take 1,000 mcg by mouth daily., Disp: , Rfl:    Dulaglutide (TRULICITY) 1.5 YB/6.3SL SOPN, Inject 1.5 mg into the skin once a week. Inject the contents  of one pen once per week, Disp: 6 mL, Rfl: 5   ELIQUIS 5 MG TABS tablet, TAKE 1 TABLET BY MOUTH  TWICE DAILY, Disp: 180 tablet, Rfl: 3   hydrochlorothiazide (HYDRODIURIL) 25 MG tablet, TAKE 1 TABLET BY MOUTH  DAILY, Disp: 90 tablet, Rfl: 3   insulin aspart (NOVOLOG FLEXPEN) 100 UNIT/ML FlexPen, Inject 6 Units into the skin daily. With supper if blood sugar is greater than 150, Disp: , Rfl:    insulin degludec (TRESIBA FLEXTOUCH) 200 UNIT/ML FlexTouch Pen, Inject 50 Units into the skin daily., Disp: 15 mL, Rfl: 5   latanoprost (XALATAN) 0.005 % ophthalmic solution, Place 1 drop into the  left eye at bedtime. , Disp: , Rfl:    lisinopril (ZESTRIL) 20 MG tablet, TAKE 1 TABLET BY MOUTH  DAILY, Disp: 90 tablet, Rfl: 3   metoprolol succinate (TOPROL-XL) 100 MG 24 hr tablet, TAKE 1 TABLET BY MOUTH  DAILY WITH OR IMMEDIATELY  FOLLOWING A MEAL, Disp: 90 tablet, Rfl: 3   ONETOUCH ULTRA test strip, CHECK BLOOD SUGAR TWICE  DAILY, Disp: 200 strip, Rfl: 3   UNABLE TO FIND, Diet: NAS and consistent carbohydrate, Disp: , Rfl:    vitamin C (ASCORBIC ACID) 500 MG tablet, Take 500 mg by mouth daily., Disp: , Rfl:    vitamin E 1000 UNIT capsule, Take 1,000 Units by mouth daily., Disp: , Rfl:  Social History   Socioeconomic History   Marital status: Married    Spouse name: [name]   Number of children: 2   Years of education: 11   Highest education level: 11th grade  Occupational History   Occupation: maintenance    Employer: [employer]    Comment: Retired  Tobacco Use   Smoking status: Former    Packs/day: 2.00    Years: 20.00    Pack years: 40.00    Types: Cigarettes    Quit date: 07/20/1986    Years since quitting: 34.9   Smokeless tobacco: Never  Vaping Use   Vaping Use: Never used  Substance and Sexual Activity   Alcohol use: No    Alcohol/week: 0.0 standard drinks   Drug use: No   Sexual activity: Yes  Other Topics Concern   Not on file  Social History Narrative   Lives with wife in home   Right Handed   Drinks 6-7 cups caffeine daily   Social Determinants of Health   Financial Resource Strain: Not on file  Food Insecurity: Not on file  Transportation Needs: Not on file  Physical Activity: Not on file  Stress: Not on file  Social Connections: Not on file  Intimate Partner Violence: Not on file   Family History  Problem Relation Age of Onset   Heart disease Mother    Cancer Mother        breast   Stroke Father 73   Hypertension Father    Early death Sister        infant death   Heart disease Brother    Hydrocephalus Brother    Heart disease Brother    Diabetes  Brother    Cancer Brother        prostat   Healthy Son    Healthy Son     Objective: Office vital signs reviewed. BP 129/71    Pulse 64    Temp 98.2 F (36.8 C)    Ht 5' 5"  (1.651 m)    Wt 173 lb (78.5 kg)    SpO2 98%  BMI 28.79 kg/m   Physical Examination:  General: Awake, alert, well nourished, No acute distress HEENT: Sclera white.  Moist mucous membranes Cardio: regular rate and rhythm, S1S2 heard, no murmurs appreciated Pulm: clear to auscultation bilaterally, no wheezes, rhonchi or rales; normal work of breathing on room air MSK: Ambulating with use of cane.  He is able to rise from a seated position without utilizing his upper extremities.  Assessment/ Plan: 80 y.o. male   Type 2 diabetes mellitus with stage 3b chronic kidney disease, with long-term current use of insulin ([place]) - Plan: Bayer DCA Hb A1c Waived, Lipid Panel, CANCELED: Renal Function Panel  Hypertension associated with diabetes ([place]) - Plan: Lipid Panel, CANCELED: Renal Function Panel  Ataxia due to recent stroke - Plan: Ambulatory referral to [place]  I canceled my renal function panel as he was collecting labs for his upcoming appointment with nephrology.  A1c shows control of sugar.  He will continue follow-up with endocrinology as scheduled  Blood pressure at goal.  No changes needed  I renewed his home health physical therapy.  He certainly seems like he is making some strides in the right direction but could use a little more to improve balance and stability.  We discussed that it is possible the insurance will not cover additional home health but we might be able to switch over to outpatient and he was amenable to this possibility.  Orders Placed This Encounter  Procedures   [facility] [place] Hb A1c Waived   Lipid Panel   Ambulatory referral to Home Health    Referral Priority:   Routine    Referral Type:   Home Health Care    Referral Reason:   Specialty Services Required    Requested Specialty:    [place]    Number of Visits Requested:   1   No orders of the defined types were placed in this encounter.    [name], DO [place] [phone]

## 2021-07-03 ENCOUNTER — Ambulatory Visit (INDEPENDENT_AMBULATORY_CARE_PROVIDER_SITE_OTHER): Payer: Medicare Other

## 2021-07-03 VITALS — Ht 65.0 in | Wt 173.0 lb

## 2021-07-03 DIAGNOSIS — Z599 Problem related to housing and economic circumstances, unspecified: Secondary | ICD-10-CM

## 2021-07-03 DIAGNOSIS — Z79899 Other long term (current) drug therapy: Secondary | ICD-10-CM

## 2021-07-03 DIAGNOSIS — Z0000 Encounter for general adult medical examination without abnormal findings: Secondary | ICD-10-CM | POA: Diagnosis not present

## 2021-07-03 LAB — LIPID PANEL
Chol/HDL Ratio: 3.1 ratio (ref 0.0–5.0)
Cholesterol, Total: 116 mg/dL (ref 100–199)
HDL: 37 mg/dL — ABNORMAL LOW (ref >39)
LDL Chol Calc (NIH): 54 mg/dL (ref 0–99)
Triglycerides: 141 mg/dL (ref 0–149)
VLDL Cholesterol Cal: 25 mg/dL (ref 5–40)

## 2021-07-03 NOTE — Patient Instructions (Signed)
[name] , Thank you for taking time to come for your Medicare Wellness Visit. I appreciate your ongoing commitment to your health goals. Please review the following plan we discussed and let me know if I can assist you in the future.   Screening recommendations/referrals: Colonoscopy: Done 08/13/2016 - no repeat Recommended yearly ophthalmology/optometry visit for glaucoma screening and checkup Recommended yearly dental visit for hygiene and checkup  Vaccinations: Influenza vaccine: Declined Pneumococcal vaccine: Done 06/18/2015 & 12/16/2016 Tdap vaccine: Done 03/05/2011 - Repeat in 10 years *Due Shingles vaccine: Due   Covid-19: Done 08/16/2019, 09/13/2019, & 07/04/2020  Advanced directives: Please bring a copy of your health care power of attorney and living will to the office to be added to your chart at your convenience.  Conditions/risks identified: Aim for 30 minutes of exercise or brisk walking each day, drink 6-8 glasses of water and eat lots of fruits and vegetables.   Next appointment: Follow up in one year for your annual wellness visit.   Preventive Care 75 Years and Older, Male  Preventive care refers to lifestyle choices and visits with your health care provider that can promote health and wellness. What does preventive care include? A yearly physical exam. This is also called an annual well check. Dental exams once or twice a year. Routine eye exams. Ask your health care provider how often you should have your eyes checked. Personal lifestyle choices, including: Daily care of your teeth and gums. Regular physical activity. Eating a healthy diet. Avoiding tobacco and drug use. Limiting alcohol use. Practicing safe sex. Taking low doses of aspirin every day. Taking vitamin and mineral supplements as recommended by your health care provider. What happens during an annual well check? The services and screenings done by your health care provider during your annual well  check will depend on your age, overall health, lifestyle risk factors, and family history of disease. Counseling  Your health care provider may ask you questions about your: Alcohol use. Tobacco use. Drug use. Emotional well-being. Home and relationship well-being. Sexual activity. Eating habits. History of falls. Memory and ability to understand (cognition). Work and work Statistician. Screening  You may have the following tests or measurements: Height, weight, and BMI. Blood pressure. Lipid and cholesterol levels. These may be checked every 5 years, or more frequently if you are over 70 years old. Skin check. Lung cancer screening. You may have this screening every year starting at age 24 if you have a 30-pack-year history of smoking and currently smoke or have quit within the past 15 years. Fecal occult blood test (FOBT) of the stool. You may have this test every year starting at age 50. Flexible sigmoidoscopy or colonoscopy. You may have a sigmoidoscopy every 5 years or a colonoscopy every 10 years starting at age 56. Prostate cancer screening. Recommendations will vary depending on your family history and other risks. Hepatitis C blood test. Hepatitis B blood test. Sexually transmitted disease (STD) testing. Diabetes screening. This is done by checking your blood sugar (glucose) after you have not eaten for a while (fasting). You may have this done every 1-3 years. Abdominal aortic aneurysm (AAA) screening. You may need this if you are a current or former smoker. Osteoporosis. You may be screened starting at age 8 if you are at high risk. Talk with your health care provider about your test results, treatment options, and if necessary, the need for more tests. Vaccines  Your health care provider may recommend certain vaccines, such as: Influenza  vaccine. This is recommended every year. Tetanus, diphtheria, and acellular pertussis (Tdap, Td) vaccine. You may need a Td booster  every 10 years. Zoster vaccine. You may need this after age 48. Pneumococcal 13-valent conjugate (PCV13) vaccine. One dose is recommended after age 23. Pneumococcal polysaccharide (PPSV23) vaccine. One dose is recommended after age 21. Talk to your health care provider about which screenings and vaccines you need and how often you need them. This information is not intended to replace advice given to you by your health care provider. Make sure you discuss any questions you have with your health care provider. Document Released: 08/02/2015 Document Revised: 03/25/2016 Document Reviewed: 05/07/2015 Elsevier Interactive Patient Education  2017 [place] Prevention in the Home Falls can cause injuries. They can happen to people of all ages. There are many things you can do to make your home safe and to help prevent falls. What can I do on the outside of my home? Regularly fix the edges of walkways and driveways and fix any cracks. Remove anything that might make you trip as you walk through a door, such as a raised step or threshold. Trim any bushes or trees on the path to your home. Use bright outdoor lighting. Clear any walking paths of anything that might make someone trip, such as rocks or tools. Regularly check to see if handrails are loose or broken. Make sure that both sides of any steps have handrails. Any raised decks and porches should have guardrails on the edges. Have any leaves, snow, or ice cleared regularly. Use sand or salt on walking paths during winter. Clean up any spills in your garage right away. This includes oil or grease spills. What can I do in the bathroom? Use night lights. Install grab bars by the toilet and in the tub and shower. Do not use towel bars as grab bars. Use non-skid mats or decals in the tub or shower. If you need to sit down in the shower, use a plastic, non-slip stool. Keep the floor dry. Clean up any water that spills on the floor as soon  as it happens. Remove soap buildup in the tub or shower regularly. Attach bath mats securely with double-sided non-slip rug tape. Do not have throw rugs and other things on the floor that can make you trip. What can I do in the bedroom? Use night lights. Make sure that you have a light by your bed that is easy to reach. Do not use any sheets or blankets that are too big for your bed. They should not hang down onto the floor. Have a firm chair that has side arms. You can use this for support while you get dressed. Do not have throw rugs and other things on the floor that can make you trip. What can I do in the kitchen? Clean up any spills right away. Avoid walking on wet floors. Keep items that you use a lot in easy-to-reach places. If you need to reach something above you, use a strong step stool that has a grab bar. Keep electrical cords out of the way. Do not use floor polish or wax that makes floors slippery. If you must use wax, use non-skid floor wax. Do not have throw rugs and other things on the floor that can make you trip. What can I do with my stairs? Do not leave any items on the stairs. Make sure that there are handrails on both sides of the stairs and use them. Fix  handrails that are broken or loose. Make sure that handrails are as long as the stairways. Check any carpeting to make sure that it is firmly attached to the stairs. Fix any carpet that is loose or worn. Avoid having throw rugs at the top or bottom of the stairs. If you do have throw rugs, attach them to the floor with carpet tape. Make sure that you have a light switch at the top of the stairs and the bottom of the stairs. If you do not have them, ask someone to add them for you. What else can I do to help prevent falls? Wear shoes that: Do not have high heels. Have rubber bottoms. Are comfortable and fit you well. Are closed at the toe. Do not wear sandals. If you use a stepladder: Make sure that it is fully  opened. Do not climb a closed stepladder. Make sure that both sides of the stepladder are locked into place. Ask someone to hold it for you, if possible. Clearly mark and make sure that you can see: Any grab bars or handrails. First and last steps. Where the edge of each step is. Use tools that help you move around (mobility aids) if they are needed. These include: Canes. Walkers. Scooters. Crutches. Turn on the lights when you go into a dark area. Replace any light bulbs as soon as they burn out. Set up your furniture so you have a clear path. Avoid moving your furniture around. If any of your floors are uneven, fix them. If there are any pets around you, be aware of where they are. Review your medicines with your doctor. Some medicines can make you feel dizzy. This can increase your chance of falling. Ask your doctor what other things that you can do to help prevent falls. This information is not intended to replace advice given to you by your health care provider. Make sure you discuss any questions you have with your health care provider. Document Released: 05/02/2009 Document Revised: 12/12/2015 Document Reviewed: 08/10/2014 Elsevier Interactive Patient Education  2017 [employer].

## 2021-07-03 NOTE — Progress Notes (Signed)
Subjective:   Evan Stanley is a 80 y.o. male who presents for Medicare Annual/Subsequent preventive examination.  Virtual Visit via Telephone Note  I connected with  Evan Stanley on 07/03/21 at  1:15 PM EST by telephone and verified that I am speaking with the correct person using two identifiers.  Location: Patient: Home Provider: [facility] Persons participating in the virtual visit: patient/Nurse Health Advisor   I discussed the limitations, risks, security and privacy concerns of performing an evaluation and management service by telephone and the availability of in person appointments. The patient expressed understanding and agreed to proceed.  Interactive audio and video telecommunications were attempted between this nurse and patient, however failed, due to patient having technical difficulties OR patient did not have access to video capability.  We continued and completed visit with audio only.  Some vital signs may be absent or patient reported.   Evan Stanley Evan Cavon Nicolls, LPN   Review of Systems    hx Cardiac Risk Factors include: advanced age (>8mn, >>60women);diabetes mellitus;male gender;hypertension;sedentary lifestyle;Other (see comment), Risk factor comments: hx of TIA, A.Fib, chronic heart failure     Objective:    Today's Vitals   07/03/21 1314  Weight: 173 lb (78.5 kg)  Height: 5' 5"  (1.651 m)   Body mass index is 28.79 kg/m.  Advanced Directives 07/03/2021 07/02/2020 05/20/2020 05/17/2020 05/14/2020 05/10/2020 05/06/2020  Does Patient Have a Medical Advance Directive? Yes No Yes No Yes No No  Type of [place] - - - - - -  Does patient want to make changes to medical advance directive? - - No - Patient declined No - Patient declined No - Patient declined - -  Copy of [place] Chart? No - copy requested - - - - - -  Would patient like information on creating a medical advance directive? - No -  Patient declined - - - No - Patient declined No - Patient declined    Current Medications (verified) Outpatient Encounter Medications as of 07/03/2021  Medication Sig   acetaminophen (TYLENOL) 325 MG tablet Take 2 tablets (650 mg total) by mouth every 6 (six) hours as needed for mild pain (or Fever >/= 101).   atorvastatin (LIPITOR) 40 MG tablet TAKE 1 TABLET BY MOUTH  DAILY   atropine 1 % ophthalmic solution Place 1 drop into the left eye 2 (two) times daily.   cetirizine (ZYRTEC) 10 MG tablet Take 1 tablet by mouth as needed for allergies.   Cholecalciferol 25 MCG (1000 UT) capsule Take 1 capsule by mouth daily.   cyanocobalamin 1000 MCG tablet Take 1,000 mcg by mouth daily.   Dulaglutide (TRULICITY) 1.5 MOI/7.8MVSOPN Inject 1.5 mg into the skin once a week. Inject the contents of one pen once per week   ELIQUIS 5 MG TABS tablet TAKE 1 TABLET BY MOUTH  TWICE DAILY   hydrochlorothiazide (HYDRODIURIL) 25 MG tablet TAKE 1 TABLET BY MOUTH  DAILY   insulin aspart (NOVOLOG FLEXPEN) 100 UNIT/ML FlexPen Inject 6 Units into the skin daily. With supper if blood sugar is greater than 150   insulin degludec (TRESIBA FLEXTOUCH) 200 UNIT/ML FlexTouch Pen Inject 50 Units into the skin daily.   latanoprost (XALATAN) 0.005 % ophthalmic solution Place 1 drop into the left eye at bedtime.    lisinopril (ZESTRIL) 20 MG tablet TAKE 1 TABLET BY MOUTH  DAILY   metoprolol succinate (TOPROL-XL) 100 MG 24 hr tablet TAKE 1 TABLET  BY MOUTH  DAILY WITH OR IMMEDIATELY  FOLLOWING A MEAL   ONETOUCH ULTRA test strip CHECK BLOOD SUGAR TWICE  DAILY   UNABLE TO FIND Diet: NAS and consistent carbohydrate   vitamin C (ASCORBIC ACID) 500 MG tablet Take 500 mg by mouth daily.   vitamin Evan 1000 UNIT capsule Take 1,000 Units by mouth daily.   No facility-administered encounter medications on file as of 07/03/2021.    Allergies (verified) Baclofen and Morphine and related   History: Past Medical History:  Diagnosis Date    AKI (acute kidney injury) ([place])    Allergy    Anxiety    Arthritis    Right shoulder   Atrial fibrillation (HCC)    Carotid artery stenosis    Cataract    CHF (congestive heart failure) (HCC)    CVA (cerebral vascular accident) ([place]) 04/28/2016   Decreased vision    left eye   Diabetes mellitus without complication ([place])    Takes Metformin   Hyperlipidemia    Hypertension    Salmonella bacteremia 04/29/2016   Sepsis ([place]) 04/29/2016   Stroke ([place]) 11/18/2015   no deficits   TIA (transient ischemic attack)    affected left eye   Urgency of urination    Past Surgical History:  Procedure Laterality Date   AORTIC VALVE REPLACEMENT     BACK SURGERY  80s   CARDIAC VALVE REPLACEMENT  11/01/2008   21-mm Edwards Pericardial Magna - Ease valve   ENDARTERECTOMY Right 01/13/2016   Procedure: ENDARTERECTOMY CAROTID - RIGHT;  Surgeon: Conrad [place], MD;  Location: [facility];  Service: Vascular;  Laterality: Right;   EYE SURGERY     laser surgery, left eye  Left 10-29-2015    retinal surgery/ Deloria Lair MD    PERIPHERAL VASCULAR CATHETERIZATION Right 11/18/2015   Procedure: Carotid Angiography;  Surgeon: Conrad [place], MD;  Location: [facility];  Service: Cardiovascular;  Laterality: Right;   PERIPHERAL VASCULAR CATHETERIZATION N/A 11/18/2015   Procedure: Aortic Arch Angiography;  Surgeon: Conrad Hyde, MD;  Location: [facility];  Service: Cardiovascular;  Laterality: N/A;   TEE WITHOUT CARDIOVERSION N/A 05/05/2016   Procedure: TRANSESOPHAGEAL ECHOCARDIOGRAM (TEE);  Surgeon: Lelon Perla, MD;  Location: [facility] ENDOSCOPY;  Service: Cardiovascular;  Laterality: N/A;   Family History  Problem Relation Age of Onset   Heart disease Mother    Cancer Mother        breast   Stroke Father 17   Hypertension Father    Early death Sister        infant death   Heart disease Brother    Hydrocephalus Brother    Heart disease Brother    Diabetes Brother    Cancer Brother        prostat    Healthy Son    Healthy Son    Social History   Socioeconomic History   Marital status: Married    Spouse name: [place]   Number of children: 2   Years of education: 11   Highest education level: 11th grade  Occupational History   Occupation: maintenance    Employer: [employer]    Comment: Retired  Tobacco Use   Smoking status: Former    Packs/day: 2.00    Years: 20.00    Pack years: 40.00    Types: Cigarettes    Quit date: 07/20/1986    Years since quitting: 34.9   Smokeless tobacco: Never  Vaping Use   Vaping Use:  Never used  Substance and Sexual Activity   Alcohol use: No    Alcohol/week: 0.0 standard drinks   Drug use: No   Sexual activity: Yes  Other Topics Concern   Not on file  Social History Narrative   Lives with wife in home   Right Handed   Drinks 6-7 cups caffeine daily   Social Determinants of Health   Financial Resource Strain: Medium Risk   Difficulty of Paying Living Expenses: Somewhat hard  Food Insecurity: No Food Insecurity   Worried About Charity fundraiser in the Last Year: Never true   Ran Out of Food in the Last Year: Never true  Transportation Needs: No Transportation Needs   Lack of Transportation (Medical): No   Lack of Transportation (Non-Medical): No  Physical Activity: Sufficiently Active   Days of Exercise per Week: 5 days   Minutes of Exercise per Session: 30 min  Stress: No Stress Concern Present   Feeling of Stress : Only a little  Social Connections: Moderately Integrated   Frequency of Communication with Friends and Family: More than three times a week   Frequency of Social Gatherings with Friends and Family: More than three times a week   Attends Religious Services: Never   Marine scientist or Organizations: Yes   Attends Music therapist: More than 4 times per year   Marital Status: Married    Tobacco Counseling Counseling given: Not Answered   Clinical Intake:  Pre-visit preparation completed:  Yes  Pain : No/denies pain     BMI - recorded: 28.79 Nutritional Status: BMI 25 -29 Overweight Nutritional Risks: None Diabetes: Yes CBG done?: No Did pt. bring in CBG monitor from home?: No  How often do you need to have someone help you when you read instructions, pamphlets, or other written materials from your doctor or pharmacy?: 1 - Never  Diabetic? Nutrition Risk Assessment:  Has the patient had any N/V/D within the last 2 months?  No  Does the patient have any non-healing wounds?  No  Has the patient had any unintentional weight loss or weight gain?  No   Diabetes:  Is the patient diabetic?  Yes  If diabetic, was a CBG obtained today?  No  Did the patient bring in their glucometer from home?  No  How often do you monitor your CBG's? BID - 89 this am per patient.   Financial Strains and Diabetes Management:  Are you having any financial strains with the device, your supplies or your medication? Yes .  Does the patient want to be seen by Chronic Care Management for management of their diabetes?   Patient thinks he may benefit from this, but his wife manages his medications and she isn't home - would like for [place] to call and see if she feels the same Would the patient like to be referred to a Nutritionist or for Diabetic Management?  No   Diabetic Exams:  Diabetic Eye Exam: Completed 09/16/2020.   Diabetic Foot Exam: Completed 09/20/2020. Pt has been advised about the importance in completing this exam. Pt is scheduled for diabetic foot exam on next year.    Interpreter Needed?: No  Information entered by :: Emmabelle Fear, LPN   Activities of Daily Living In your present state of health, do you have any difficulty performing the following activities: 07/03/2021  Hearing? Y  Vision? N  Difficulty concentrating or making decisions? Y  Walking or climbing stairs? Y  Dressing or  bathing? N  Doing errands, shopping? N  Preparing Food and eating ? N  Using the Toilet? N   In the past six months, have you accidently leaked urine? N  Do you have problems with loss of bowel control? N  Managing your Medications? Y  Managing your Finances? Y  Housekeeping or managing your Housekeeping? Y  Some recent data might be hidden    Patient Care Team: [name], DO as PCP - General (Family Medicine) Belva Crome, MD as PCP - Cardiology (Cardiology) Conrad Meeker, MD as Consulting Physician (Vascular Surgery) Hurman Horn, MD as Consulting Physician (Ophthalmology) Clent Jacks, MD as Consulting Physician (Ophthalmology) [name], MD as Consulting Physician (Endocrinology) Lavera Guise, [facility] (Pharmacist)  Indicate any recent Medical Services you may have received from other than [facility] providers in the past year (date may be approximate).     Assessment:   This is a routine wellness examination for Evan.  Hearing/Vision screen Hearing Screening - Comments:: C/o mild hearing difficulties - declines hearing aids Vision Screening - Comments:: Wears rx glasses- up to date with annual eye exams with Dr Katy Fitch  Dietary issues and exercise activities discussed: Current Exercise Habits: Home exercise routine, Type of exercise: walking;strength training/weights;stretching, Time (Minutes): 30, Frequency (Times/Week): 5, Weekly Exercise (Minutes/Week): 150, Intensity: Moderate, Exercise limited by: neurologic condition(s);cardiac condition(s);orthopedic condition(s)   Goals Addressed             This Visit's Progress    Exercise 150 min/wk Moderate Activity   On track    Patient Stated   On track    07/02/2020 AWV Goal: Fall Prevention  Over the next year, patient will decrease their risk for falls by: Using assistive devices, such as a cane or walker, as needed Identifying fall risks within their home and correcting them by: Removing throw rugs Adding handrails to stairs or ramps Removing clutter and keeping a clear pathway throughout the  home Increasing light, especially at night Adding shower handles/bars Raising toilet seat Identifying potential personal risk factors for falls: Medication side effects Incontinence/urgency Vestibular dysfunction Hearing loss Musculoskeletal disorders Neurological disorders Orthostatic hypotension         Depression Screen PHQ 2/9 Scores 07/03/2021 07/02/2021 05/06/2021 04/04/2021 09/20/2020 07/02/2020 05/29/2020  PHQ - 2 Score 0 0 3 0 0 0 0  PHQ- 9 Score 2 0 9 4 0 - -    Fall Risk Fall Risk  07/03/2021 07/02/2021 05/06/2021 04/04/2021 09/20/2020  Falls in the past year? 1 1 1 1 1   Number falls in past yr: 0 0 0 0 0  Injury with Fall? 0 0 0 0 0  Comment - - - - -  Risk for fall due to : History of fall(s);Impaired balance/gait;Orthopedic patient;Mental status change History of fall(s) History of fall(s) - History of fall(s)  Follow up Education provided;Falls prevention discussed Education provided Education provided - Falls prevention discussed    FALL RISK PREVENTION PERTAINING TO THE HOME:  Any stairs in or around the home? Yes  If so, are there any without handrails? No  Home free of loose throw rugs in walkways, pet beds, electrical cords, etc? Yes  Adequate lighting in your home to reduce risk of falls? Yes   ASSISTIVE DEVICES UTILIZED TO PREVENT FALLS:  Life alert? No  Use of a cane, walker or w/c? Yes  Grab bars in the bathroom? Yes  Shower chair or bench in shower? Yes  Elevated toilet seat or a handicapped  toilet? No   TIMED UP AND GO:  Was the test performed? No . Telephonic visit.  Cognitive Function: Cognitive status assessed by direct observation. Patient has current diagnosis of cognitive impairment. Patient is followed by neurology for ongoing assessment.   MMSE - Mini Mental State Exam 11/10/2017 11/04/2016  Orientation to time 5 4  Orientation to Place 5 5  Registration 3 3  Attention/ Calculation 0 1  Attention/Calculation-comments not attempted -   Recall 2 3  Language- name 2 objects 2 2  Language- repeat 1 1  Language- follow 3 step command 3 3  Language- read & follow direction 1 1  Write a sentence 1 1  Copy design 1 1  Total score 24 25   Montreal Cognitive Assessment  04/14/2021  Visuospatial/ Executive (0/5) 1  Naming (0/3) 3  Attention: Read list of digits (0/2) 2  Attention: Read list of letters (0/1) 1  Attention: Serial 7 subtraction starting at 100 (0/3) 0  Language: Repeat phrase (0/2) 1  Language : Fluency (0/1) 0  Abstraction (0/2) 0  Delayed Recall (0/5) 0  Orientation (0/6) 5  Total 13   6CIT Screen 07/03/2021 07/02/2020 05/24/2019  What Year? 0 points 0 points -  What month? 0 points - -  What time? 0 points 0 points 0 points  Count back from 20 0 points 0 points 0 points  Months in reverse 2 points 0 points 0 points  Repeat phrase 2 points 0 points 0 points  Total Score 4 - -    Immunizations Immunization History  Administered Date(s) Administered   Fluad Quad(high Dose 65+) 05/23/2019, 04/25/2020   Influenza Split 04/27/2016   Influenza Whole 05/26/2010   Influenza, High Dose Seasonal PF 05/20/2017, 05/23/2018   Influenza,inj,Quad PF,6+ Mos 06/14/2013, 04/16/2014, 06/18/2015   Influenza-Unspecified 05/14/2016, 05/20/2017, 05/23/2018   Moderna Sars-Covid-2 Vaccination 08/16/2019, 09/13/2019, 07/04/2020   Pneumococcal Conjugate-13 06/18/2015   Pneumococcal Polysaccharide-23 12/16/2016   Tdap 03/05/2011    TDAP status: Up to date  Flu Vaccine status: Declined, Education has been provided regarding the importance of this vaccine but patient still declined. Advised may receive this vaccine at local pharmacy or Health Dept. Aware to provide a copy of the vaccination record if obtained from local pharmacy or Health Dept. Verbalized acceptance and understanding.  Pneumococcal vaccine status: Up to date  Covid-19 vaccine status: Completed vaccines  Qualifies for Shingles Vaccine? Yes   Zostavax  completed Yes   Shingrix Completed?: No.    Education has been provided regarding the importance of this vaccine. Patient has been advised to call insurance company to determine out of pocket expense if they have not yet received this vaccine. Advised may also receive vaccine at local pharmacy or Health Dept. Verbalized acceptance and understanding.  Screening Tests Health Maintenance  Topic Date Due   Zoster Vaccines- Shingrix (1 of 2) 07/04/2021 (Originally 01/23/1960)   COVID-19 Vaccine (4 - Booster for Moderna series) 07/18/2021 (Originally 08/29/2020)   INFLUENZA VACCINE  10/17/2021 (Originally 02/17/2021)   TETANUS/TDAP  04/04/2022 (Originally 03/04/2021)   OPHTHALMOLOGY EXAM  09/16/2021   FOOT EXAM  09/20/2021   HEMOGLOBIN A1C  12/31/2021   Pneumonia Vaccine 59+ Years old  Completed   Hepatitis C Screening  Completed   HPV VACCINES  Aged Out   COLONOSCOPY (Pts 45-15yr Insurance coverage will need to be confirmed)  Discontinued    Health Maintenance  There are no preventive care reminders to display for this patient.  Colorectal cancer screening: No  longer required.   Lung Cancer Screening: (Low Dose CT Chest recommended if Age 33-80 years, 30 pack-year currently smoking OR have quit w/in 15years.) does not qualify.   Additional Screening:  Hepatitis C Screening: does not qualify  Vision Screening: Recommended annual ophthalmology exams for early detection of glaucoma and other disorders of the eye. Is the patient up to date with their annual eye exam?  Yes  Who is the provider or what is the name of the office in which the patient attends annual eye exams? [facility] If pt is not established with a provider, would they like to be referred to a provider to establish care? No .   Dental Screening: Recommended annual dental exams for proper oral hygiene  Community Resource Referral / Chronic Care Management: CRR required this visit?  No   CCM required this visit?  No      Plan:      I have personally reviewed and noted the following in the patients chart:   Medical and social history Use of alcohol, tobacco or illicit drugs  Current medications and supplements including opioid prescriptions. Patient is not currently taking opioid prescriptions. Functional ability and status Nutritional status Physical activity Advanced directives List of other physicians Hospitalizations, surgeries, and ER visits in previous 12 months Vitals Screenings to include cognitive, depression, and falls Referrals and appointments  In addition, I have reviewed and discussed with patient certain preventive protocols, quality metrics, and best practice recommendations. A written personalized care plan for preventive services as well as general preventive health recommendations were provided to patient.     Sandrea Hammond, LPN   [id number]   Nurse Notes: CCM referral to assist with medication costs - patient's wife isn't home and he has dementia, but says his medicines are very expensive, so she may decline services or say they aren't necessary when we call

## 2021-07-04 ENCOUNTER — Telehealth: Payer: Self-pay

## 2021-07-04 NOTE — Chronic Care Management (AMB) (Signed)
Chronic Care Management   Note  07/04/2021 Name: Evan Stanley MRN: [id number] DOB: Feb 10, 1941  [name] is a 80 y.o. year old male who is a primary care patient of [name], DO. I reached out to Evan Stanley by phone today in response to a referral sent by [name]'s PCP.  Evan Stanley was given information about Chronic Care Management services today including:  CCM service includes personalized support from designated clinical staff supervised by his physician, including individualized plan of care and coordination with other care providers 24/7 contact phone numbers for assistance for urgent and routine care needs. Service will only be billed when office clinical staff spend 20 minutes or more in a month to coordinate care. Only one practitioner may furnish and bill the service in a calendar month. The patient may stop CCM services at any time (effective at the end of the month) by phone call to the office staff. The patient is responsible for co-pay (up to 20% after annual deductible is met) if co-pay is required by the individual health plan.   Patient agreed to services and verbal consent obtained.   Follow up plan: Telephone appointment with care management team member scheduled for:08/15/2021  Noreene Larsson, [address], [place], [place] [address] Direct Dial: [phone] [email or web address] Website: [email or web address]

## 2021-07-17 DIAGNOSIS — N189 Chronic kidney disease, unspecified: Secondary | ICD-10-CM | POA: Diagnosis not present

## 2021-07-17 DIAGNOSIS — E1122 Type 2 diabetes mellitus with diabetic chronic kidney disease: Secondary | ICD-10-CM | POA: Diagnosis not present

## 2021-07-17 DIAGNOSIS — R809 Proteinuria, unspecified: Secondary | ICD-10-CM | POA: Diagnosis not present

## 2021-07-17 DIAGNOSIS — I952 Hypotension due to drugs: Secondary | ICD-10-CM | POA: Diagnosis not present

## 2021-07-17 DIAGNOSIS — E559 Vitamin D deficiency, unspecified: Secondary | ICD-10-CM | POA: Diagnosis not present

## 2021-07-17 DIAGNOSIS — E1129 Type 2 diabetes mellitus with other diabetic kidney complication: Secondary | ICD-10-CM | POA: Diagnosis not present

## 2021-07-17 DIAGNOSIS — I5032 Chronic diastolic (congestive) heart failure: Secondary | ICD-10-CM | POA: Diagnosis not present

## 2021-07-27 ENCOUNTER — Other Ambulatory Visit: Payer: Self-pay | Admitting: Family Medicine

## 2021-07-27 DIAGNOSIS — E1169 Type 2 diabetes mellitus with other specified complication: Secondary | ICD-10-CM

## 2021-07-30 NOTE — Telephone Encounter (Signed)
Received notification from [place] regarding RE-ENROLLMENT approval for [place]. Patient assistance approved from 07/29/21 to 06/18/22.  Phone: [phone]

## 2021-08-11 ENCOUNTER — Ambulatory Visit: Payer: Medicare Other | Admitting: Neurology

## 2021-08-11 ENCOUNTER — Encounter: Payer: Self-pay | Admitting: Neurology

## 2021-08-11 VITALS — BP 131/70 | HR 62 | Ht 65.0 in | Wt 177.0 lb

## 2021-08-11 DIAGNOSIS — F03B0 Unspecified dementia, moderate, without behavioral disturbance, psychotic disturbance, mood disturbance, and anxiety: Secondary | ICD-10-CM | POA: Insufficient documentation

## 2021-08-11 DIAGNOSIS — I639 Cerebral infarction, unspecified: Secondary | ICD-10-CM

## 2021-08-11 DIAGNOSIS — R269 Unspecified abnormalities of gait and mobility: Secondary | ICD-10-CM

## 2021-08-11 NOTE — Progress Notes (Signed)
Chief Complaint  Patient presents with   Follow-up    Rm 13. PCP is Dr. Ronnie Doss. Accompanied by wife. States movement on left side is the same as last visit. Pt reports struggling to remember names. Would like to know if/why he should continue to f/u with neurology.      ASSESSMENT AND PLAN  Evan Stanley is a 81 y.o. male   Stroke Presented with Worsening gait abnormality Since COVID infection in August 2022  Multiple stroke in the past,  Multiple vascular risk factor, hypertension, hyperlipidemia, diabetes, atrial fibrillation, history of stroke, history of right internal carotid artery endarterectomy, peripheral vascular disease, aortic valve replacement, previous smoker, aging,  MRI of the brain showed acute to subacute stroke involving right frontal subcortical region, worsening moderate generalized atrophy, moderate supratentorium small vessel disease, multiple lacunar infarction, evidence of occlusion of left internal carotid artery and right vertebral artery that was present at previous MRI  Already on anticoagulation Eliquis 5 mg twice a day  Emphasized importance of continue moderate exercise, compliant with his medications, increase water intake  Dementia  Combination of central nervous system degenerative disorder with vascular component  Deficit with visual spatial orientation, MoCA examination was 13/30 in September 2022,  Had long discussion with patient and his wife, I worry about his ability to drive, suggest him to stop driving,  DIAGNOSTIC DATA (LABS, IMAGING, TESTING) - I reviewed patient records, labs, notes, testing and imaging myself where available.  MRI in May 2017 1. Scattered foci of acute nonhemorrhagic infarction involving the posterior right frontal lobe, the right parietal lobe, in the anterior left frontal lobe. 2. Moderate age advanced atrophy and scattered white matter changes bilaterally compatible with chronic microvascular ischemic  disease. 3. Abnormal signal compatible with occlusion in the left internal carotid proximal to the ophthalmic segment and in the distal right vertebral artery.  MRI in June 2020: 1.  No acute intracranial abnormality. 2. Stable since October 2017 including: - evidence of chronically occluded left ICA and distal right vertebral artery. - scattered small chronic infarcts in the cerebral cortex and white matter. - advanced cerebral volume loss.  CT Angiogram of neck and head in August 2021: 1. Negative CTA for emergent large vessel occlusion. 2. Chronic occlusion of the left ICA at its origin, with distal reconstitution at the cavernous segment. 3. Sequelae of prior right carotid endarterectomy with wide patency of the right CCA and ICA. Severe stenosis versus occlusion involving the proximal right ECA as above. 4. Moderate to severe ostial stenosis at the origin of the right vertebral artery, with additional moderate to severe multifocal right V4 stenoses. The right vertebral artery essentially occludes prior to the vertebrobasilar junction. Dominant left vertebral artery patent without significant stenosis. 5. 50% stenosis involving the mid basilar artery.  Laboratory evaluation in 3664: Normal folic acid, vitamin D, ammonia level 28, normal vitamin B12, CMP showed elevated creatinine 1.9, GFR of 35, CBC  MEDICAL HISTORY:  Evan Stanley is a 81 year old male, seen in request by his primary care physician Dr. Lajuana Stanley, [facility]els for evaluation of memory loss, gait abnormality, initial evaluation was with his wife on April 14, 2021  I reviewed and summarized the referring note.PMHX. HTN DM since 2010 HLD Atrial Fibrillation-on eliquis Stroke in Oct 2017, left side weakness, recovered well, was able to go back driving, golf Right endarterectomy January 13, 2016 Aortic valve replacement, Left eye, retinal artery occlusion in 2015, lost vision of left eye. Peripheral vascular  disease.  Smoker in the past,   He had a multiple stroke in the past, had multiple vascular risk factor, atrial fibrillation, diabetes, hypertension, hyperlipidemia, aortic valve replacement, has mild residual left-sided weakness, gait abnormality from previous stroke, already have some baseline gait abnormality  Wife noticed that patient has worsening gait abnormality memory loss since his COVID infection in August 2022, he was severely hydrated, high fever for few days, had bad congestion, achy all over, but does not require hospitalization  Since then, he was noted to have increased left-sided weakness, also developed this flailed movement of left lower extremity, sometimes involving left upper extremity, also increased confusion, memory loss,  He retired from maintenance at age 35, prior to the [place] infection in August 2022, he was still able to drive short distance to golf course, play golf, now wife is worried about leaving him alone at  house for few hours,  UPDATE Aug 11 2021: he is accompanied by, wife at today's visit, since last visit in September 2022, he had made significant progress, home physical therapy has been very helpful  He continues to have significant memory loss, loss of vision of left eye due to previous left retinal artery occlusion, he likes to drive short distance to golf course, to check with his friends, but is no longer able to play, mild unsteady gait  Personally reviewed MRI of the brain in October 2022: Moderate generalized atrophy, scattered positive DWI lesion at the right frontal lobe, consistent with acute/subacute infarction, cerebral lacunar infarction in the cerebral hemisphere, pons, left cerebellar hemisphere, occlusion of left internal carotid artery, right vertebral artery, also was seen at previous MRI  He is taking Eliquis due to history of atrial fibrillation, PHYSICAL EXAM:   Vitals:   08/11/21 1027  BP: 131/70  Pulse: 62  Weight: 177 lb (80.3  kg)  Height: 5' 5"  (1.651 m)   Not recorded     Body mass index is 29.45 kg/m.  PHYSICAL EXAMNIATION:  Gen: NAD, conversant, well nourised, well groomed                MENTAL STATUS: Speech:    Speech is normal; fluent and spontaneous with normal comprehension.  Cognition: Montreal Cognitive Assessment  04/14/2021  Visuospatial/ Executive (0/5) 1  Naming (0/3) 3  Attention: Read list of digits (0/2) 2  Attention: Read list of letters (0/1) 1  Attention: Serial 7 subtraction starting at 100 (0/3) 0  Language: Repeat phrase (0/2) 1  Language : Fluency (0/1) 0  Abstraction (0/2) 0  Delayed Recall (0/5) 0  Orientation (0/6) 5  Total 13       We did not perform full MoCA examination today, but he was noted to have significant difficulty copy design, draw clock face/and minute hand  CRANIAL NERVES: CN II: Visual fields are full to confrontation. Pupils are round equal and briskly reactive to light. CN III, IV, VI: extraocular movement are normal. No ptosis. CN V: Facial sensation is intact to light touch CN VII: Face is symmetric with normal eye closure  CN VIII: Hearing is normal to causal conversation. CN IX, X: Phonation is normal. CN XI: Head turning and shoulder shrug are intact  MOTOR: Fixation of left upper extremity on rapid rotating movement, left lower extremity drift  REFLEXES: Hyperreflexia on the left side  SENSORY: Intact to light touch, pinprick and vibratory sensation are intact in fingers and toes.  COORDINATION: There is no trunk or limb dysmetria noted.  GAIT/STANCE: Need push-up to  get up from seated position, pointing right toes outwards, dragging left leg, wide-based, mildly unsteady REVIEW OF SYSTEMS:  Full 14 system review of systems performed and notable only for as above All other review of systems were negative.   ALLERGIES: Allergies  Allergen Reactions   Baclofen Other (See Comments)    Stroke like symptoms   Morphine And Related  Other (See Comments)    Delirium     HOME MEDICATIONS: Current Outpatient Medications  Medication Sig Dispense Refill   acetaminophen (TYLENOL) 325 MG tablet Take 2 tablets (650 mg total) by mouth every 6 (six) hours as needed for mild pain (or Fever >/= 101).     atorvastatin (LIPITOR) 40 MG tablet TAKE 1 TABLET BY MOUTH  DAILY 90 tablet 0   atropine 1 % ophthalmic solution Place 1 drop into the left eye 2 (two) times daily. 3 mL 11   cetirizine (ZYRTEC) 10 MG tablet Take 1 tablet by mouth as needed for allergies.     Cholecalciferol 25 MCG (1000 UT) capsule Take 1 capsule by mouth daily.     cyanocobalamin 1000 MCG tablet Take 1,000 mcg by mouth daily.     Dulaglutide (TRULICITY) 1.5 HC/6.2BJ SOPN Inject 1.5 mg into the skin once a week. Inject the contents of one pen once per week 6 mL 5   ELIQUIS 5 MG TABS tablet TAKE 1 TABLET BY MOUTH  TWICE DAILY 180 tablet 3   hydrochlorothiazide (HYDRODIURIL) 25 MG tablet TAKE 1 TABLET BY MOUTH  DAILY 90 tablet 3   insulin aspart (NOVOLOG FLEXPEN) 100 UNIT/ML FlexPen Inject 6 Units into the skin daily. With supper if blood sugar is greater than 150     insulin degludec (TRESIBA FLEXTOUCH) 200 UNIT/ML FlexTouch Pen Inject 50 Units into the skin daily. 15 mL 5   latanoprost (XALATAN) 0.005 % ophthalmic solution Place 1 drop into the left eye at bedtime.      lisinopril (ZESTRIL) 20 MG tablet TAKE 1 TABLET BY MOUTH  DAILY 90 tablet 0   metoprolol succinate (TOPROL-XL) 100 MG 24 hr tablet TAKE 1 TABLET BY MOUTH  DAILY WITH OR IMMEDIATELY  FOLLOWING A MEAL 90 tablet 3   ONETOUCH ULTRA test strip CHECK BLOOD SUGAR TWICE  DAILY 200 strip 3   UNABLE TO FIND Diet: NAS and consistent carbohydrate     vitamin C (ASCORBIC ACID) 500 MG tablet Take 500 mg by mouth daily.     vitamin E 1000 UNIT capsule Take 1,000 Units by mouth daily.     No current facility-administered medications for this visit.    PAST MEDICAL HISTORY: Past Medical History:  Diagnosis  Date   AKI (acute kidney injury) ([place])    Allergy    Anxiety    Arthritis    Right shoulder   Atrial fibrillation ([facility])    Carotid artery stenosis    Cataract    CHF (congestive heart failure) ([facility])    CVA (cerebral vascular accident) ([place]) 04/28/2016   Decreased vision    left eye   Diabetes mellitus without complication ([place])    Takes Metformin   Hyperlipidemia    Hypertension    Salmonella bacteremia 04/29/2016   Sepsis ([place]) 04/29/2016   Stroke ([place]) 11/18/2015   no deficits   TIA (transient ischemic attack)    affected left eye   Urgency of urination     PAST SURGICAL HISTORY: Past Surgical History:  Procedure Laterality Date   AORTIC VALVE REPLACEMENT  BACK SURGERY  80s   CARDIAC VALVE REPLACEMENT  11/01/2008   21-mm Edwards Pericardial Magna - Ease valve   ENDARTERECTOMY Right 01/13/2016   Procedure: ENDARTERECTOMY CAROTID - RIGHT;  Surgeon: Conrad Decatur, MD;  Location: [facility];  Service: Vascular;  Laterality: Right;   EYE SURGERY     laser surgery, left eye  Left 10-29-2015    retinal surgery/ Deloria Lair MD    PERIPHERAL VASCULAR CATHETERIZATION Right 11/18/2015   Procedure: Carotid Angiography;  Surgeon: Conrad Carson, MD;  Location: [facility];  Service: Cardiovascular;  Laterality: Right;   PERIPHERAL VASCULAR CATHETERIZATION N/A 11/18/2015   Procedure: Aortic Arch Angiography;  Surgeon: Conrad Red [place], MD;  Location: [facility];  Service: Cardiovascular;  Laterality: N/A;   TEE WITHOUT CARDIOVERSION N/A 05/05/2016   Procedure: TRANSESOPHAGEAL ECHOCARDIOGRAM (TEE);  Surgeon: Lelon Perla, MD;  Location: [facility];  Service: Cardiovascular;  Laterality: N/A;    FAMILY HISTORY: Family History  Problem Relation Age of Onset   Heart disease Mother    Cancer Mother        breast   Stroke Father 9   Hypertension Father    Early death Sister        infant death   Heart disease Brother    Hydrocephalus Brother    Heart disease Brother     Diabetes Brother    Cancer Brother        prostat   Healthy Son    Healthy Son     SOCIAL HISTORY: Social History   Socioeconomic History   Marital status: Married    Spouse name: [name]   Number of children: 2   Years of education: 11   Highest education level: 11th grade  Occupational History   Occupation: maintenance    Employer: [employer]    Comment: Retired  Tobacco Use   Smoking status: Former    Packs/day: 2.00    Years: 20.00    Pack years: 40.00    Types: Cigarettes    Quit date: 07/20/1986    Years since quitting: 35.0   Smokeless tobacco: Never  Vaping Use   Vaping Use: Never used  Substance and Sexual Activity   Alcohol use: No    Alcohol/week: 0.0 standard drinks   Drug use: No   Sexual activity: Yes  Other Topics Concern   Not on file  Social History Narrative   Lives with wife in home   Right Handed   Drinks 6-7 cups caffeine daily   Social Determinants of Health   Financial Resource Strain: Medium Risk   Difficulty of Paying Living Expenses: Somewhat hard  Food Insecurity: No Food Insecurity   Worried About Charity fundraiser in the Last Year: Never true   Ran Out of Food in the Last Year: Never true  Transportation Needs: No Transportation Needs   Lack of Transportation (Medical): No   Lack of Transportation (Non-Medical): No  Physical Activity: Sufficiently Active   Days of Exercise per Week: 5 days   Minutes of Exercise per Session: 30 min  Stress: No Stress Concern Present   Feeling of Stress : Only a little  Social Connections: Moderately Integrated   Frequency of Communication with Friends and Family: More than three times a week   Frequency of Social Gatherings with Friends and Family: More than three times a week   Attends Religious Services: Never   Marine scientist or Organizations: Yes   Attends  Music therapist: More than 4 times per year   Marital Status: Married  Human resources officer Violence: Not At Risk   Fear  of Current or Ex-Partner: No   Emotionally Abused: No   Physically Abused: No   Sexually Abused: No   Total time spent reviewing the chart, obtaining history, examined patient, ordering tests, documentation, consultations and family, care coordination was 40 minutes    Marcial Pacas, M.D. Ph.D.  [facility] [address] [place], [place] [address] Ph: [phone] Fax: [phone]  CC:  [name], DO [address]  [place] [address]  [name], DO

## 2021-08-14 ENCOUNTER — Telehealth: Payer: Medicare Other

## 2021-08-15 ENCOUNTER — Ambulatory Visit (INDEPENDENT_AMBULATORY_CARE_PROVIDER_SITE_OTHER): Payer: Medicare Other | Admitting: Pharmacist

## 2021-08-15 DIAGNOSIS — E1122 Type 2 diabetes mellitus with diabetic chronic kidney disease: Secondary | ICD-10-CM

## 2021-08-15 DIAGNOSIS — N1832 Chronic kidney disease, stage 3b: Secondary | ICD-10-CM

## 2021-08-19 DIAGNOSIS — E1122 Type 2 diabetes mellitus with diabetic chronic kidney disease: Secondary | ICD-10-CM

## 2021-08-19 DIAGNOSIS — N1832 Chronic kidney disease, stage 3b: Secondary | ICD-10-CM

## 2021-08-19 DIAGNOSIS — Z794 Long term (current) use of insulin: Secondary | ICD-10-CM

## 2021-08-25 NOTE — Progress Notes (Signed)
Chronic Care Management Pharmacy Note  08/15/2021 Name:  Evan Stanley MRN:  [id number] DOB:  [birth year]  Summary: T2DM, CKD  Recommendations/Changes made from today's visit:  Diabetes: New goal. Controlled-A1C 6.5%; current treatment: TRESIBA, NOVOLOG, TRULICITY;  CONTINUE CURRENT REGIMEN CONSIDER SGLT2 FOR T2DM & CKD 3B-- GFR 35 (WILL DISCUSS WITH PCP) PATIENT APPROVED FOR ASSISTANCE VIA NOVO [place] PAP (INSULINS) AND LILLY CARES PAP (TRULICITY) Denies personal and family history of Medullary thyroid cancer (MTC) Current glucose readings: fasting glucose: <130, post prandial glucose: <180 Denies hypoglycemic/hyperglycemic symptoms Discussed meal planning options and Plate method for healthy eating Avoid sugary drinks and desserts Incorporate balanced protein, non starchy veggies, 1 serving of carbohydrate with each meal Increase water intake Increase physical activity as able Recommended SGLT2 PATIENT ENROLLED FOR 2023 PATIENT ASSISTANCE  Subjective: Evan Stanley is an 81 y.o. year old male who is a primary patient of [name], DO.  The CCM team was consulted for assistance with disease management and care coordination needs.    Engaged with patient by telephone for initial visit in response to provider referral for pharmacy case management and/or care coordination services.   Consent to Services:  The patient was given information about Chronic Care Management services, agreed to services, and gave verbal consent prior to initiation of services.  Please see initial visit note for detailed documentation.   Patient Care Team: [name], DO as PCP - General (Family Medicine) Belva Crome, MD as PCP - Cardiology (Cardiology) Zadie Rhine Clent Demark, MD as Consulting Physician (Ophthalmology) Clent Jacks, MD as Consulting Physician (Ophthalmology) [name], MD as Consulting Physician (Endocrinology) Lavera Guise, [facility] (Pharmacist) Marcial Pacas, MD as  Consulting Physician (Neurology) [facility] Steffanie Rainwater, DPM as Consulting Physician (Podiatry) Lavera Guise, [facility] (Pharmacist)   Objective:  Lab Results  Component Value Date   CREATININE 1.91 (H) 04/04/2021   CREATININE 1.90 (H) 03/18/2021   CREATININE 1.71 (H) 08/12/2020    Lab Results  Component Value Date   HGBA1C 6.5 (H) 07/02/2021   Last diabetic Eye exam:  Lab Results  Component Value Date/Time   HMDIABEYEEXA No Retinopathy 09/16/2020 12:00 AM    Last diabetic Foot exam: No results found for: HMDIABFOOTEX      Component Value Date/Time   CHOL 116 07/02/2021 1030   TRIG 141 07/02/2021 1030   HDL 37 (L) 07/02/2021 1030   CHOLHDL 3.1 07/02/2021 1030   CHOLHDL 3.0 12/25/2019 0257   VLDL 12 12/25/2019 0257   LDLCALC 54 07/02/2021 1030   LDLDIRECT 62 02/16/2017 1151    Hepatic Function Latest Ref Rng & Units 04/04/2021 03/18/2021 05/09/2020  Total Protein 6.0 - 8.5 g/dL 6.1 6.3 5.7(L)  Albumin 3.7 - 4.7 g/dL 4.1 4.1 2.9(L)  AST 0 - 40 IU/L 23 30 22   ALT 0 - 44 IU/L 22 31 24   Alk Phosphatase 44 - 121 IU/L 86 105 53  Total Bilirubin 0.0 - 1.2 mg/dL 0.6 0.7 2.0(H)    Lab Results  Component Value Date/Time   TSH 1.036 05/06/2020 08:26 AM   TSH 1.93 09/06/2019 10:39 AM   TSH 1.890 06/14/2013 11:09 AM   FREET4 0.97 09/06/2019 10:39 AM    CBC Latest Ref Rng & Units 04/04/2021 03/18/2021 05/29/2020  WBC 3.4 - 10.8 x10E3/uL 8.8 5.9 7.8  Hemoglobin 13.0 - 17.7 g/dL 15.0 15.6 12.2(L)  Hematocrit 37.5 - 51.0 % 45.7 48.2 39.1  Platelets 150 -  450 x10E3/uL 169 118(L) 188    Lab Results  Component Value Date/Time   VD25OH 59.9 04/04/2021 03:30 PM    Clinical ASCVD: Yes  The ASCVD Risk score (Arnett DK, et al., 2019) failed to calculate for the following reasons:   The patient has a prior MI or stroke diagnosis    Other: (CHADS2VASc if Afib, PHQ9 if depression, MMRC or CAT for COPD, ACT,  DEXA)  Social History   Tobacco Use  Smoking Status Former   Packs/day: 2.00   Years: 20.00   Pack years: 40.00   Types: Cigarettes   Quit date: 07/20/1986   Years since quitting: 35.1  Smokeless Tobacco Never   BP Readings from Last 3 Encounters:  08/11/21 131/70  07/02/21 129/71  05/14/21 116/64   Pulse Readings from Last 3 Encounters:  08/11/21 62  07/02/21 64  05/14/21 63   Wt Readings from Last 3 Encounters:  08/11/21 177 lb (80.3 kg)  07/03/21 173 lb (78.5 kg)  07/02/21 173 lb (78.5 kg)    Assessment: Review of patient past medical history, allergies, medications, health status, including review of consultants reports, laboratory and other test data, was performed as part of comprehensive evaluation and provision of chronic care management services.   SDOH:  (Social Determinants of Health) assessments and interventions performed:    CCM Care Plan  Allergies  Allergen Reactions   Baclofen Other (See Comments)    Stroke like symptoms   Morphine And Related Other (See Comments)    Delirium     Medications Reviewed Today     Reviewed by Lavera Guise, [facility] (Pharmacist) on 08/28/21 at 1117  Med List Status: <None>   Medication Order Taking? Sig Documenting Provider Last Dose Status Informant  acetaminophen (TYLENOL) 325 MG tablet [id number] No Take 2 tablets (650 mg total) by mouth every 6 (six) hours as needed for mild pain (or Fever >/= 101). Nita Sells, MD Taking Active   atorvastatin (LIPITOR) 40 MG tablet [id number] No TAKE 1 TABLET BY MOUTH  DAILY [name], DO Taking Active   atropine 1 % ophthalmic solution [id number] No Place 1 drop into the left eye 2 (two) times daily. [place], Clent Demark, MD Taking Active   cetirizine (ZYRTEC) 10 MG tablet [id number] No Take 1 tablet by mouth as needed for allergies. [provider] Taking Active   Cholecalciferol 25 MCG (1000 UT) capsule [id number] No Take 1 capsule by mouth daily. [provider] Taking Active   cyanocobalamin 1000 MCG tablet [id number] No Take 1,000 mcg by mouth daily. [provider] Taking Active   Dulaglutide (TRULICITY) 1.5 YT/0.3TW SOPN [id number] No Inject 1.5 mg into the skin once a week. Inject the contents of one pen once per week [name], MD Taking Active            Med Note Parthenia Ames Aug 28, 2021 11:16 AM) Via [employer] patient assistance program    ELIQUIS 5 MG TABS tablet [id number] No TAKE 1 TABLET BY MOUTH  TWICE DAILY [name], DO Taking Active   hydrochlorothiazide (HYDRODIURIL) 25 MG tablet [id number] No TAKE 1 TABLET BY MOUTH  DAILY [name], DO Taking Active   insulin aspart (NOVOLOG FLEXPEN) 100 UNIT/ML FlexPen [id number] No Inject 6 Units into the skin daily. With supper if blood sugar is greater than 150 [provider] Taking Active            Med Note (Jenisa Monty,  Tamey Wanek D   Thu Aug 28, 2021 11:17 AM) VIA NOVO [place]ORD[place] PATIENT ASSISTANCE PROGRAM  insulin degludec (TRESIBA FLEXTOUCH) 200 UNIT/ML FlexTouch Pen [id number] No Inject 50 Units into the skin daily. [name], MD Taking Active Multiple Informants           Med Note Blanca Friend, Royce Macadamia   Thu Aug 28, 2021 11:17 AM) VIA NOVO NORDISK PATIENT ASSISTANCE PROGRAM   latanoprost (XALATAN) 0.005 % ophthalmic solution [id number] No Place 1 drop into the left eye at bedtime.  [provider] Taking Active Multiple Informants  lisinopril (ZESTRIL) 20 MG tablet [id number] No TAKE 1 TABLET BY MOUTH  DAILY [name], DO Taking Active   metoprolol succinate (TOPROL-XL) 100 MG 24 hr tablet [id number] No TAKE 1 TABLET BY MOUTH  DAILY WITH OR IMMEDIATELY  FOLLOWING A MEAL [name], DO Taking Active   [facility] ULTRA test strip [id number] No CHECK BLOOD SUGAR TWICE  DAILY [name], DO Taking Active   UNABLE TO FIND [id number] No Diet: NAS and consistent carbohydrate [provider] Taking Active    vitamin C (ASCORBIC ACID) 500 MG tablet [id number] No Take 500 mg by mouth daily. [provider] Taking Active Multiple Informants  vitamin E 1000 UNIT capsule [id number] No Take 1,000 Units by mouth daily. [provider] Taking Active Multiple Informants            Patient Active Problem List   Diagnosis Date Noted   Moderate dementia 08/11/2021   Cerebrovascular accident (CVA) ([place]) 04/14/2021   Gait abnormality 04/14/2021   Dementia without behavioral disturbance ([place]) 04/14/2021   Abnormal movement 04/14/2021   COVID-19 virus RNA test result positive at limit of detection 03/19/2021   Cough 03/18/2021   Fever 03/18/2021   Fatigue 03/18/2021   CKD stage 3 due to type 2 diabetes mellitus ([place]) 05/10/2020   Vitamin B 12 deficiency 50/27/7412   Chronic systolic heart failure ([place]) 05/10/2020   Hematoma of muscle 05/07/2020   Normocytic anemia due to blood loss 05/07/2020   Falls 87/86/7672   Metabolic encephalopathy 09/47/0962   Paroxysmal A-fib ([place]) 01/04/2020   Gait disorder    Stroke ([place]) 12/24/2019   Posterior vitreous detachment of right eye 11/29/2019   Branch retinal artery occlusion of left eye 11/29/2019   Rubeosis iridis of left eye 11/29/2019   Glaucoma associated with ocular disorder, left, severe stage 11/29/2019   History of TIA (transient ischemic attack) 05/13/2019   Family history of epilepsy and other diseases of the nervous system 05/13/2019   Coronary arteriosclerosis in native artery 12/12/2018   Type 2 diabetes mellitus ([place]) 12/12/2018   Right Carotid artery stenosis 11/03/2017   Crohn's disease ([place]) 04/30/2016   Stage 3 chronic kidney disease ([place]) 04/29/2016   Late effects of cerebrovascular disease 04/03/2016   History of aortic valve replacement 01/05/2016   AKI (acute kidney injury) ([place])    Type 2 diabetes mellitus with peripheral neuropathy ([place])    Left Carotid artery occlusion 11/18/2015   Disorder of carotid artery  ([place]) 11/18/2015   Occlusion and stenosis of right carotid artery 10/26/2014   Hyperlipidemia associated w/ type 2 DM goal <70 07/05/2013   Hypertension associated with diabetes ([place]) 07/05/2013   Hyperlipidemia, unspecified 07/05/2013    Immunization History  Administered Date(s) Administered   Fluad Quad(high Dose 65+) 05/23/2019, 04/25/2020   Influenza Split 04/27/2016   Influenza Whole 05/26/2010   Influenza, High Dose Seasonal PF 05/20/2017, 05/23/2018   Influenza,inj,Quad PF,6+  Mos 06/14/2013, 04/16/2014, 06/18/2015   Influenza-Unspecified 05/14/2016, 05/20/2017, 05/23/2018   Moderna Sars-Covid-2 Vaccination 08/16/2019, 09/13/2019, 07/04/2020   Pneumococcal Conjugate-13 06/18/2015   Pneumococcal Polysaccharide-23 12/16/2016   Tdap 03/05/2011    Conditions to be addressed/monitored: DMII and CKD Stage 3B  Care Plan : PHARMD MEDICATION MANAGEMENT  Updates made by Lavera Guise, RPH since 08/28/2021 12:00 AM     Problem: DISEASE PROGRESSION PREVENTION      Long-Range Goal: T2DM, CKD   This Visit's Progress: Not on track  Priority: High  Note:   Current Barriers:  Unable to independently afford treatment regimen Suboptimal therapeutic regimen for T2DM, CKD  Pharmacist Clinical Goal(s):  patient will verbalize ability to afford treatment regimen maintain control of T2DM as evidenced by CONTINUED GOAL A1C<7%  adhere to plan to optimize therapeutic regimen for T2DM/CKD as evidenced by report of adherence to recommended medication management changes through collaboration with PharmD and provider.    Interventions: 1:1 collaboration with [name], DO regarding development and update of comprehensive plan of care as evidenced by provider attestation and co-signature Inter-disciplinary care team collaboration (see longitudinal plan of care) Comprehensive medication review performed; medication list updated in electronic medical record  Diabetes: New  goal. Controlled-A1C 6.5%; current treatment: TRESIBA, NOVOLOG, TRULICITY;  CONTINUE CURRENT REGIMEN CONSIDER SGLT2 FOR T2DM & CKD -- GFR 35 (WILL DISCUSS WITH PCP) PATIENT APPROVED FOR ASSISTANCE VIA NOVO [place] PAP (INSULINS) AND LILLY CARES PAP (TRULICITY) Denies personal and family history of Medullary thyroid cancer (MTC) Current glucose readings: fasting glucose: <130, post prandial glucose: <180 Denies hypoglycemic/hyperglycemic symptoms Discussed meal planning options and Plate method for healthy eating Avoid sugary drinks and desserts Incorporate balanced protein, non starchy veggies, 1 serving of carbohydrate with each meal Increase water intake Increase physical activity as able Recommended SGLT2 PATIENT ENROLLED FOR 2023 PATIENT ASSISTANCE   Patient Goals/Self-Care Activities patient will:  - take medications as prescribed as evidenced by patient report and record review check glucose DAILY, document, and provide at future appointments collaborate with provider on medication access solutions target a minimum of 150 minutes of moderate intensity exercise weekly engage in dietary modifications by   FOLLOWING A HEART HEALTHY DIET/HEALTHY PLATE METHOD       Medication Assistance:  TRESIBA//NOVOLOG VIA NOVO [place] PAP  TRULICITY VIA LILLY CARES  Patient's preferred pharmacy is:  [facility] - [place], [place] [address] [address] Phone: [phone] Fax: [phone]  [id number] [place]: [phone]: [phone] [facility] [address]: [phone]: [phone] [facility] [place] [address]Phone: [phone] Fax: [phone]  Follow Up:  Patient agrees to Care Plan and Follow-up.  Plan: Telephone follow up appointment with care management team member scheduled for:  1 MONTH   JRegina Eck PharmD, BCPS Clinical Pharmacist, [place] Phone [phone]

## 2021-08-28 NOTE — Patient Instructions (Signed)
Visit Information  Following are the goals we discussed today:  Current Barriers:  Unable to independently afford treatment regimen Suboptimal therapeutic regimen for T2DM, CKD  Pharmacist Clinical Goal(s):  patient will verbalize ability to afford treatment regimen maintain control of T2DM as evidenced by CONTINUED GOAL A1C<7%  adhere to plan to optimize therapeutic regimen for T2DM/CKD as evidenced by report of adherence to recommended medication management changes through collaboration with PharmD and provider.    Interventions: 1:1 collaboration with [name], DO regarding development and update of comprehensive plan of care as evidenced by provider attestation and co-signature Inter-disciplinary care team collaboration (see longitudinal plan of care) Comprehensive medication review performed; medication list updated in electronic medical record  Diabetes: New goal. Controlled-A1C 6.5%; current treatment: TRESIBA, NOVOLOG, TRULICITY;  CONTINUE CURRENT REGIMEN CONSIDER SGLT2 FOR T2DM & CKD -- GFR 35 (WILL DISCUSS WITH PCP) PATIENT APPROVED FOR ASSISTANCE VIA NOVO [place] PAP (INSULINS) AND LILLY CARES PAP (TRULICITY) Denies personal and family history of Medullary thyroid cancer (MTC) Current glucose readings: fasting glucose: <130, post prandial glucose: <180 Denies hypoglycemic/hyperglycemic symptoms Discussed meal planning options and Plate method for healthy eating Avoid sugary drinks and desserts Incorporate balanced protein, non starchy veggies, 1 serving of carbohydrate with each meal Increase water intake Increase physical activity as able Recommended SGLT2 PATIENT ENROLLED FOR 2023 PATIENT ASSISTANCE   Patient Goals/Self-Care Activities patient will:  - take medications as prescribed as evidenced by patient report and record review check glucose DAILY, document, and provide at future appointments collaborate with provider on medication access  solutions target a minimum of 150 minutes of moderate intensity exercise weekly engage in dietary modifications by FOLLOWING A HEART HEALTHY DIET/HEALTHY PLATE METHOD    Plan: Telephone follow up appointment with care management team member scheduled for:  1 MONTH  Signature Regina Eck, PharmD, BCPS Clinical Pharmacist, [place]  [place] Phone [phone]   Please call the care guide team at [phone] if you need to cancel or reschedule your appointment.   Patient verbalizes understanding of instructions and care plan provided today and agrees to view in [place]. Active MyChart status confirmed with patient.

## 2021-09-03 ENCOUNTER — Telehealth: Payer: Self-pay

## 2021-09-03 ENCOUNTER — Telehealth: Payer: Self-pay | Admitting: Family Medicine

## 2021-09-03 ENCOUNTER — Telehealth: Payer: Self-pay | Admitting: Pharmacist

## 2021-09-03 NOTE — Telephone Encounter (Signed)
Please let patient know he has #1 box of novolog insulin in the fridge for pick up from novo nordisk patient assistance program Shipments will likely come separate for his patient assistance medications due to shortages and backorders  Thanks!

## 2021-09-03 NOTE — Telephone Encounter (Signed)
Patient aware and verbalizes understanding. 

## 2021-09-03 NOTE — Telephone Encounter (Signed)
Wife called in to see if there is any medication from patient assistance here for pickup. Informed patient that there has not been any recent delivers except for pen needles, however patient does have two boxes of novolog pens that were never picked up. She will come tomorrow or Friday to pick up both pen needles and novolog.

## 2021-09-04 NOTE — Telephone Encounter (Signed)
Lmtcb.

## 2021-09-04 NOTE — Telephone Encounter (Signed)
Received notification from [employer] regarding RE-ENROLLMENT approval for [place]. Patient assistance approved from 07/29/21 to 06/18/22.   Phone: [phone]  The medications are likely shipping in separate boxes due to shipment delays.  If she needs further assistance she can call company or we can route to our CPhT, Hortencia Pilar in [employer]

## 2021-09-04 NOTE — Telephone Encounter (Signed)
Patients wife picked up pen needles & Novolog pens - log documented as well

## 2021-09-05 NOTE — Telephone Encounter (Signed)
Returning nurse call.

## 2021-09-05 NOTE — Telephone Encounter (Signed)
Wife aware and verbalizes understanding per dpr.

## 2021-09-09 DIAGNOSIS — L84 Corns and callosities: Secondary | ICD-10-CM | POA: Diagnosis not present

## 2021-09-09 DIAGNOSIS — E1142 Type 2 diabetes mellitus with diabetic polyneuropathy: Secondary | ICD-10-CM | POA: Diagnosis not present

## 2021-09-09 DIAGNOSIS — M79676 Pain in unspecified toe(s): Secondary | ICD-10-CM | POA: Diagnosis not present

## 2021-09-09 DIAGNOSIS — B351 Tinea unguium: Secondary | ICD-10-CM | POA: Diagnosis not present

## 2021-09-10 ENCOUNTER — Other Ambulatory Visit: Payer: Medicare Other

## 2021-09-10 DIAGNOSIS — R809 Proteinuria, unspecified: Secondary | ICD-10-CM | POA: Diagnosis not present

## 2021-09-10 DIAGNOSIS — E1129 Type 2 diabetes mellitus with other diabetic kidney complication: Secondary | ICD-10-CM | POA: Diagnosis not present

## 2021-09-10 DIAGNOSIS — E559 Vitamin D deficiency, unspecified: Secondary | ICD-10-CM | POA: Diagnosis not present

## 2021-09-10 DIAGNOSIS — E1122 Type 2 diabetes mellitus with diabetic chronic kidney disease: Secondary | ICD-10-CM | POA: Diagnosis not present

## 2021-09-10 DIAGNOSIS — N189 Chronic kidney disease, unspecified: Secondary | ICD-10-CM | POA: Diagnosis not present

## 2021-09-17 ENCOUNTER — Encounter: Payer: Self-pay | Admitting: Endocrinology

## 2021-09-17 ENCOUNTER — Other Ambulatory Visit: Payer: Self-pay

## 2021-09-17 ENCOUNTER — Ambulatory Visit: Payer: Medicare Other | Admitting: Endocrinology

## 2021-09-17 VITALS — BP 110/68 | HR 77 | Ht 65.0 in | Wt 177.0 lb

## 2021-09-17 DIAGNOSIS — E1165 Type 2 diabetes mellitus with hyperglycemia: Secondary | ICD-10-CM | POA: Diagnosis not present

## 2021-09-17 DIAGNOSIS — I10 Essential (primary) hypertension: Secondary | ICD-10-CM

## 2021-09-17 DIAGNOSIS — Z794 Long term (current) use of insulin: Secondary | ICD-10-CM | POA: Diagnosis not present

## 2021-09-17 LAB — POCT GLYCOSYLATED HEMOGLOBIN (HGB A1C): Hemoglobin A1C: 7.2 % — AB (ref 4.0–5.6)

## 2021-09-17 NOTE — Patient Instructions (Addendum)
6 Novolog at supper but 8-9 for larger meals or starchy foods ? ?Am Novolog 9 units  ? ?Check blood sugars on waking up 3 days a week ? ?Also check blood sugars about 2 hours after meals and do this after different meals by rotation ? ?Recommended blood sugar levels on waking up are 90-130 and about 2 hours after meal is 130-180 ? ?Please bring your blood sugar monitor to each visit, thank you ? ?

## 2021-09-17 NOTE — Progress Notes (Signed)
Patient ID: Evan Stanley, male   DOB: 05-Jan-1941, 81 y.o.   MRN: [id number]          Reason for Appointment: Follow-up for Type 2 Diabetes    History of Present Illness:          Date of diagnosis of type 2 diabetes mellitus: 2010       Background history:   He thinks he was diagnosed to have diabetes after his coronary bypass surgery and blood sugars are not very high He had been on metformin until about 05/2017 Also at some point had taken glimepiride Usually appears to have had A1c just above 7%  Recent history:   INSULIN regimen is: [place] 52 units daily, Novolog 6 units at breakfast and 8 units at dinner  Non-insulin hypoglycemic drugs the patient is taking are: Trulicity 1.5 mg weekly  His A1c is gradually increasing at 7.2 compared to 6.2 last year    Current management, blood sugar patterns and problems identified: Previously was having low normal readings in the mornings and Tresiba was reduced slightly  Despite asking him to check blood sugars by rotation after meals he is only checking readings after meals in the evening  Blood sugar in the office today done by patient was 245 However blood sugar done in the lab with nephrologist about 10 days ago was 301 late morning  He still continues to eat large carbohydrate breakfast with 2 waffles and syrup with only some sausage for protein  However he does not check readings after breakfast but only before eating  Generally fasting blood sugars excellent Blood sugars after dinner are somewhat variable based on his intake  Last night with eating only 2 chicken strips and a couple of ice, please he had a blood sugar of 89  He thinks his meals are relatively small at least in the evening Usually not eating excessive snacks However usually skipping lunch with only a small snack Has been getting his Trulicity and insulin from patient assistance programs Has not missed any doses of Trulicity However his weight has gone up  since last year and not clear why        Side effects from medications have been: None  Glucose monitoring:  done about 2 times a day         Glucometer: One Touch ultra 2.       Blood Glucose readings by review of home monitor download show   PRE-MEAL Fasting Lunch Dinner Bedtime Overall  Glucose range: 89-175      Mean/median: 115    127   POST-MEAL PC Breakfast PC Lunch PC Dinner  Glucose range: ?  78-239  Mean/median:      Previously:  PRE-MEAL Fasting Lunch Dinner Bedtime Overall  Glucose range: 81-129      Mean/median: 102    118   POST-MEAL PC Breakfast PC Lunch PC Dinner  Glucose range:   81-213  Mean/median:   139     Self-care: The diet that the patient has been following is: tries to limit sweets.     Meal times are:  Breakfast is at after 9 AM, dinner around 6 PM  Typical meal intake: Breakfast is eggs and meat and toast, sometimes waffles            Dietician visit, most recent: 2019                Weight history:  Wt Readings from Last 3 Encounters:  09/17/21 177  lb (80.3 kg)  08/11/21 177 lb (80.3 kg)  07/03/21 173 lb (78.5 kg)    Glycemic control:   Lab Results  Component Value Date   HGBA1C 7.2 (A) 09/17/2021   HGBA1C 6.5 (H) 07/02/2021   HGBA1C 6.2 (H) 05/06/2021   Lab Results  Component Value Date   MICROALBUR 14.4 (H) 08/12/2020   LDLCALC 54 07/02/2021   CREATININE 1.91 (H) 04/04/2021   Lab Results  Component Value Date   MICRALBCREAT 13.5 08/12/2020    Lab Results  Component Value Date   FRUCTOSAMINE 274 08/12/2020   FRUCTOSAMINE 323 (H) 04/22/2018   Lab Results  Component Value Date   HGB 15.0 04/04/2021      Allergies as of 09/17/2021       Reactions   Baclofen Other (See Comments)   Stroke like symptoms   Morphine And Related Other (See Comments)   Delirium         Medication List        Accurate as of September 17, 2021  4:04 PM. If you have any questions, ask your nurse or doctor.           acetaminophen 325 MG tablet Commonly known as: TYLENOL Take 2 tablets (650 mg total) by mouth every 6 (six) hours as needed for mild pain (or Fever >/= 101).   atorvastatin 40 MG tablet Commonly known as: LIPITOR TAKE 1 TABLET BY MOUTH  DAILY   atropine 1 % ophthalmic solution Place 1 drop into the left eye 2 (two) times daily.   cetirizine 10 MG tablet Commonly known as: ZYRTEC Take 1 tablet by mouth as needed for allergies.   Cholecalciferol 25 MCG (1000 UT) capsule Take 1 capsule by mouth daily.   cyanocobalamin 1000 MCG tablet Take 1,000 mcg by mouth daily.   Eliquis 5 MG Tabs tablet Generic drug: apixaban TAKE 1 TABLET BY MOUTH  TWICE DAILY   hydrochlorothiazide 25 MG tablet Commonly known as: HYDRODIURIL TAKE 1 TABLET BY MOUTH  DAILY   latanoprost 0.005 % ophthalmic solution Commonly known as: XALATAN Place 1 drop into the left eye at bedtime.   lisinopril 20 MG tablet Commonly known as: ZESTRIL TAKE 1 TABLET BY MOUTH  DAILY   metoprolol succinate 100 MG 24 hr tablet Commonly known as: TOPROL-XL TAKE 1 TABLET BY MOUTH  DAILY WITH OR IMMEDIATELY  FOLLOWING A MEAL   NovoLOG FlexPen 100 UNIT/ML FlexPen Generic drug: insulin aspart Inject 6 Units into the skin daily. With supper if blood sugar is greater than 150   OneTouch Ultra test strip Generic drug: glucose blood CHECK BLOOD SUGAR TWICE  DAILY   Tresiba FlexTouch 200 UNIT/ML FlexTouch Pen Generic drug: insulin degludec Inject 50 Units into the skin daily. What changed: how much to take   Trulicity 1.5 ZO/1.0RU Sopn Generic drug: Dulaglutide Inject 1.5 mg into the skin once a week. Inject the contents of one pen once per week   UNABLE TO FIND Diet: NAS and consistent carbohydrate   vitamin C 500 MG tablet Commonly known as: ASCORBIC ACID Take 500 mg by mouth daily.   vitamin E 1000 UNIT capsule Take 1,000 Units by mouth daily.        Allergies:  Allergies  Allergen Reactions    Baclofen Other (See Comments)    Stroke like symptoms   Morphine And Related Other (See Comments)    Delirium     Past Medical History:  Diagnosis Date   AKI (acute kidney injury) ([place])  Allergy    Anxiety    Arthritis    Right shoulder   Atrial fibrillation (HCC)    Carotid artery stenosis    Cataract    CHF (congestive heart failure) (HCC)    CVA (cerebral vascular accident) ([place]) 04/28/2016   Decreased vision    left eye   Diabetes mellitus without complication ([place])    Takes Metformin   Hyperlipidemia    Hypertension    Salmonella bacteremia 04/29/2016   Sepsis ([place]) 04/29/2016   Stroke ([place]) 11/18/2015   no deficits   TIA (transient ischemic attack)    affected left eye   Urgency of urination     Past Surgical History:  Procedure Laterality Date   AORTIC VALVE REPLACEMENT     BACK SURGERY  80s   CARDIAC VALVE REPLACEMENT  11/01/2008   21-mm Edwards Pericardial Magna - Ease valve   ENDARTERECTOMY Right 01/13/2016   Procedure: ENDARTERECTOMY CAROTID - RIGHT;  Surgeon: Conrad [place], MD;  Location: [facility];  Service: Vascular;  Laterality: Right;   EYE SURGERY     laser surgery, left eye  Left 10-29-2015    retinal surgery/ Deloria Lair MD    PERIPHERAL VASCULAR CATHETERIZATION Right 11/18/2015   Procedure: Carotid Angiography;  Surgeon: Conrad [place], MD;  Location: [facility];  Service: Cardiovascular;  Laterality: Right;   PERIPHERAL VASCULAR CATHETERIZATION N/A 11/18/2015   Procedure: Aortic Arch Angiography;  Surgeon: Conrad , MD;  Location: [place] [facility];  Service: Cardiovascular;  Laterality: N/A;   TEE WITHOUT CARDIOVERSION N/A 05/05/2016   Procedure: TRANSESOPHAGEAL ECHOCARDIOGRAM (TEE);  Surgeon: Lelon Perla, MD;  Location: [facility] END[facility]COPY;  Service: Cardiovascular;  Laterality: N/A;    Family History  Problem Relation Age of Onset   Heart disease Mother    Cancer Mother        breast   Stroke Father 50   Hypertension Father    Early  death Sister        infant death   Heart disease Brother    Hydrocephalus Brother    Heart disease Brother    Diabetes Brother    Cancer Brother        prostat   Healthy Son    Healthy Son     Social History:  reports that he quit smoking about 35 years ago. His smoking use included cigarettes. He has a 40.00 pack-year smoking history. He has never used smokeless tobacco. He reports that he does not drink alcohol and does not use drugs.   Review of Systems   Lipid history: Lipids controlled well on atorvastatin 40 mg daily prescribed by his PCP   Has history of CAD and CVD    Lab Results  Component Value Date   CHOL 116 07/02/2021   HDL 37 (L) 07/02/2021   LDLCALC 54 07/02/2021   LDLDIRECT 62 02/16/2017   TRIG 141 07/02/2021   CHOLHDL 3.1 07/02/2021           Hypertension:  His treatment includes hydrochlorothiazide 25 mg and lisinopril 20 mg daily,   prescribed by PCP He does monitor BP at home   BP Readings from Last 3 Encounters:  09/17/21 110/68  08/11/21 131/70  07/02/21 129/71     CKD: Creatinine variable as follows Periodically seen by nephrologist Recent creatinine was improved at 1.63  Lab Results  Component Value Date   CREATININE 1.91 (H) 04/04/2021   CREATININE 1.90 (H) 03/18/2021   CREATININE 1.71 (H)  08/12/2020     Physical Examination:  BP 110/68    Pulse 77    Ht 5' 5"  (1.651 m)    Wt 177 lb (80.3 kg)    SpO2 99%    BMI 29.45 kg/m       ASSESSMENT:  Diabetes type 2, on insulin   See history of present illness for detailed discussion of current diabetes management, blood sugar patterns and problems identified  His A1c is 7.2, has been as low as 6.2 before  He is on basal bolus insulin regimen and Trulicity 1.5 mg weekly  He is having variable readings after meals and appears to be having significantly high readings after breakfast because of large amount of carbohydrate and taking only 6 units Humalog at that time However his  evening meal composition is quite variable with sometimes low carbohydrate intake causing low normal sugars but also if with higher carbohydrate or high fat meals blood sugars are periodically as high as 239  Lab glucose 310 recently after breakfast   Also he has an old One Touch ultra meter and not clear if this is accurate  Fasting readings are not consistently high but averaging about 120 which is adequate  Also having weight gain  HYPERTENSION: Well-controlled    Renal insufficiency: Stable, continue follow-up with nephrologist   PLAN:   He will keep the same dose of Tresiba at 52 units and take at the same time daily Continue 1.5 Trulicity weekly  Consider increasing Trulicity dose if weight continues to go up Discussed adjusting the suppertime NovoLog based on his carbohydrate intake and meal size He needs to take 8 to 10 units for breakfast However he may have to reduce his carbohydrates in the morning and have only 1 serving of waffle instead of 2 along with yogurt for protein He will alternate fasting and 2-hour readings in the mornings He was previously recommended the [place] system but he refuses to consider this  Consider switching him to the One Touch Verio monitor  He will let his PCP know if his blood pressure continues to run low normal, also to be followed up by his nephrologist soon  Patient Instructions  6 Novolog at supper but 8-9 for larger meals or starchy foods  Am Novolog 9 units   Check blood sugars on waking up 3 days a week  Also check blood sugars about 2 hours after meals and do this after different meals by rotation  Recommended blood sugar levels on waking up are 90-130 and about 2 hours after meal is 130-180  Please bring your blood sugar monitor to each visit, thank you    Total visit time including counseling = 30 minutes   [name] 09/17/2021, 4:04 PM   Note: This office note was prepared with Dragon voice recognition system  technology. Any transcriptional errors that result from this process are unintentional.

## 2021-09-18 DIAGNOSIS — I129 Hypertensive chronic kidney disease with stage 1 through stage 4 chronic kidney disease, or unspecified chronic kidney disease: Secondary | ICD-10-CM | POA: Diagnosis not present

## 2021-09-18 DIAGNOSIS — R809 Proteinuria, unspecified: Secondary | ICD-10-CM | POA: Diagnosis not present

## 2021-09-18 DIAGNOSIS — E1129 Type 2 diabetes mellitus with other diabetic kidney complication: Secondary | ICD-10-CM | POA: Diagnosis not present

## 2021-09-18 DIAGNOSIS — I5032 Chronic diastolic (congestive) heart failure: Secondary | ICD-10-CM | POA: Diagnosis not present

## 2021-09-18 DIAGNOSIS — N189 Chronic kidney disease, unspecified: Secondary | ICD-10-CM | POA: Diagnosis not present

## 2021-09-18 DIAGNOSIS — E1122 Type 2 diabetes mellitus with diabetic chronic kidney disease: Secondary | ICD-10-CM | POA: Diagnosis not present

## 2021-09-18 DIAGNOSIS — D696 Thrombocytopenia, unspecified: Secondary | ICD-10-CM | POA: Diagnosis not present

## 2021-09-19 ENCOUNTER — Telehealth: Payer: Self-pay

## 2021-09-19 NOTE — Telephone Encounter (Signed)
Pt assistance insulin arrived. Called and left a message to come pick up. Put in fridge ?

## 2021-09-24 DIAGNOSIS — Z961 Presence of intraocular lens: Secondary | ICD-10-CM | POA: Diagnosis not present

## 2021-09-24 DIAGNOSIS — H2511 Age-related nuclear cataract, right eye: Secondary | ICD-10-CM | POA: Diagnosis not present

## 2021-09-24 DIAGNOSIS — H04412 Chronic dacryocystitis of left lacrimal passage: Secondary | ICD-10-CM | POA: Diagnosis not present

## 2021-09-24 DIAGNOSIS — H04202 Unspecified epiphora, left lacrimal gland: Secondary | ICD-10-CM | POA: Diagnosis not present

## 2021-09-24 DIAGNOSIS — H4089 Other specified glaucoma: Secondary | ICD-10-CM | POA: Diagnosis not present

## 2021-09-24 DIAGNOSIS — Z794 Long term (current) use of insulin: Secondary | ICD-10-CM | POA: Diagnosis not present

## 2021-09-24 DIAGNOSIS — E119 Type 2 diabetes mellitus without complications: Secondary | ICD-10-CM | POA: Diagnosis not present

## 2021-09-24 LAB — HM DIABETES EYE EXAM

## 2021-10-29 ENCOUNTER — Ambulatory Visit (INDEPENDENT_AMBULATORY_CARE_PROVIDER_SITE_OTHER): Payer: Medicare Other | Admitting: Family Medicine

## 2021-10-29 ENCOUNTER — Encounter: Payer: Self-pay | Admitting: Family Medicine

## 2021-10-29 VITALS — BP 126/71 | HR 66 | Temp 98.3°F | Ht 65.0 in | Wt 176.8 lb

## 2021-10-29 DIAGNOSIS — E1122 Type 2 diabetes mellitus with diabetic chronic kidney disease: Secondary | ICD-10-CM

## 2021-10-29 DIAGNOSIS — N1832 Chronic kidney disease, stage 3b: Secondary | ICD-10-CM | POA: Diagnosis not present

## 2021-10-29 DIAGNOSIS — E1142 Type 2 diabetes mellitus with diabetic polyneuropathy: Secondary | ICD-10-CM | POA: Diagnosis not present

## 2021-10-29 DIAGNOSIS — Z794 Long term (current) use of insulin: Secondary | ICD-10-CM

## 2021-10-29 DIAGNOSIS — E785 Hyperlipidemia, unspecified: Secondary | ICD-10-CM

## 2021-10-29 DIAGNOSIS — E1159 Type 2 diabetes mellitus with other circulatory complications: Secondary | ICD-10-CM

## 2021-10-29 DIAGNOSIS — I152 Hypertension secondary to endocrine disorders: Secondary | ICD-10-CM | POA: Diagnosis not present

## 2021-10-29 DIAGNOSIS — E1169 Type 2 diabetes mellitus with other specified complication: Secondary | ICD-10-CM | POA: Diagnosis not present

## 2021-10-29 DIAGNOSIS — I480 Paroxysmal atrial fibrillation: Secondary | ICD-10-CM | POA: Diagnosis not present

## 2021-10-29 NOTE — Progress Notes (Signed)
? ?Subjective: ?CC:DM ?PCP: [name], DO ?[name] is a 81 y.o. male is accompanied today's visit by his wife.  He is presenting to clinic today for: ? ?1. Type 2 Diabetes with hypertension, hyperlipidemia and CKD3:  ?DM managed by endocrinology.  Sees Dr Theador Hawthorne for renal.  Last checkup showed controlled type 2 diabetes for age. ? ?Last eye exam: needs ?Last foot exam: needs  ?Last A1c:  ?Lab Results  ?Component Value Date  ? HGBA1C 7.2 (A) 09/17/2021  ? ?Nephropathy screen indicated?: UTD ?Last flu, zoster and/or pneumovax:  ?Immunization History  ?Administered Date(s) Administered  ? Fluad Quad(high Dose 65+) 05/23/2019, 04/25/2020  ? Influenza Split 04/27/2016  ? Influenza Whole 05/26/2010  ? Influenza, High Dose Seasonal PF 05/20/2017, 05/23/2018  ? Influenza,inj,Quad PF,6+ Mos 06/14/2013, 04/16/2014, 06/18/2015  ? Influenza-Unspecified 05/14/2016, 05/20/2017, 05/23/2018  ? Moderna Sars-Covid-2 Vaccination 08/16/2019, 09/13/2019, 07/04/2020  ? Pneumococcal Conjugate-13 06/18/2015  ? Pneumococcal Polysaccharide-23 12/16/2016  ? Tdap 03/05/2011  ? ? ?ROS: Denies any chest pain, shortness of breath.  Has chronic instability secondary to history of stroke.  Utilizes cane for this.  Currently transitioning from Humalog to NovoLog. ? ? ? ?ROS: Per HPI ? ?Allergies  ?Allergen Reactions  ? Baclofen Other (See Comments)  ?  Stroke like symptoms  ? Morphine And Related Other (See Comments)  ?  Delirium   ? ?Past Medical History:  ?Diagnosis Date  ? AKI (acute kidney injury) ([place])   ? Allergy   ? Anxiety   ? Arthritis   ? Right shoulder  ? Atrial fibrillation ([place])   ? Carotid artery stenosis   ? Cataract   ? CHF (congestive heart failure) ([place])   ? CVA (cerebral vascular accident) ([place]) 04/28/2016  ? Decreased vision   ? left eye  ? Diabetes mellitus without complication ([place])   ? Takes Metformin  ? Hyperlipidemia   ? Hypertension   ? Salmonella bacteremia 04/29/2016  ? Sepsis ([place]) 04/29/2016  ?  Stroke ([place]) 11/18/2015  ? no deficits  ? TIA (transient ischemic attack)   ? affected left eye  ? Urgency of urination   ? ? ?Current Outpatient Medications:  ?  acetaminophen (TYLENOL) 325 MG tablet, Take 2 tablets (650 mg total) by mouth every 6 (six) hours as needed for mild pain (or Fever >/= 101)., Disp: , Rfl:  ?  atorvastatin (LIPITOR) 40 MG tablet, TAKE 1 TABLET BY MOUTH  DAILY, Disp: 90 tablet, Rfl: 0 ?  atropine 1 % ophthalmic solution, Place 1 drop into the left eye 2 (two) times daily., Disp: 3 mL, Rfl: 11 ?  cetirizine (ZYRTEC) 10 MG tablet, Take 1 tablet by mouth as needed for allergies. (Patient not taking: Reported on 09/17/2021), Disp: , Rfl:  ?  Cholecalciferol 25 MCG (1000 UT) capsule, Take 1 capsule by mouth daily., Disp: , Rfl:  ?  cyanocobalamin 1000 MCG tablet, Take 1,000 mcg by mouth daily., Disp: , Rfl:  ?  Dulaglutide (TRULICITY) 1.5 QI/3.4VQ SOPN, Inject 1.5 mg into the skin once a week. Inject the contents of one pen once per week, Disp: 6 mL, Rfl: 5 ?  ELIQUIS 5 MG TABS tablet, TAKE 1 TABLET BY MOUTH  TWICE DAILY, Disp: 180 tablet, Rfl: 3 ?  hydrochlorothiazide (HYDRODIURIL) 25 MG tablet, TAKE 1 TABLET BY MOUTH  DAILY, Disp: 90 tablet, Rfl: 3 ?  insulin aspart (NOVOLOG FLEXPEN) 100 UNIT/ML FlexPen, Inject 6 Units into the skin daily. With supper if blood sugar is greater  than 150, Disp: , Rfl:  ?  insulin degludec (TRESIBA FLEXTOUCH) 200 UNIT/ML FlexTouch Pen, Inject 50 Units into the skin daily. (Patient taking differently: Inject 52 Units into the skin daily.), Disp: 15 mL, Rfl: 5 ?  latanoprost (XALATAN) 0.005 % ophthalmic solution, Place 1 drop into the left eye at bedtime. , Disp: , Rfl:  ?  lisinopril (ZESTRIL) 20 MG tablet, TAKE 1 TABLET BY MOUTH  DAILY, Disp: 90 tablet, Rfl: 0 ?  metoprolol succinate (TOPROL-XL) 100 MG 24 hr tablet, TAKE 1 TABLET BY MOUTH  DAILY WITH OR IMMEDIATELY  FOLLOWING A MEAL, Disp: 90 tablet, Rfl: 3 ?  ONETOUCH ULTRA test strip, CHECK BLOOD SUGAR TWICE   DAILY, Disp: 200 strip, Rfl: 3 ?  UNABLE TO FIND, Diet: NAS and consistent carbohydrate, Disp: , Rfl:  ?  vitamin C (ASCORBIC ACID) 500 MG tablet, Take 500 mg by mouth daily., Disp: , Rfl:  ?  vitamin E 1000 UNIT capsule, Take 1,000 Units by mouth daily., Disp: , Rfl:  ?Social History  ? ?Socioeconomic History  ? Marital status: Married  ?  Spouse name: [name]  ? Number of children: 2  ? Years of education: 54  ? Highest education level: 11th grade  ?Occupational History  ? Occupation: maintenance  ?  Employer: [employer]  ?  Comment: Retired  ?Tobacco Use  ? Smoking status: Former  ?  Packs/day: 2.00  ?  Years: 20.00  ?  Pack years: 40.00  ?  Types: Cigarettes  ?  Quit date: 07/20/1986  ?  Years since quitting: 35.3  ? Smokeless tobacco: Never  ?Vaping Use  ? Vaping Use: Never used  ?Substance and Sexual Activity  ? Alcohol use: No  ?  Alcohol/week: 0.0 standard drinks  ? Drug use: No  ? Sexual activity: Yes  ?Other Topics Concern  ? Not on file  ?Social History Narrative  ? Lives with wife in home  ? Right Handed  ? Drinks 6-7 cups caffeine daily  ? ?Social Determinants of Health  ? ?Financial Resource Strain: Medium Risk  ? Difficulty of Paying Living Expenses: Somewhat hard  ?Food Insecurity: No Food Insecurity  ? Worried About Charity fundraiser in the Last Year: Never true  ? Ran Out of Food in the Last Year: Never true  ?Transportation Needs: No Transportation Needs  ? Lack of Transportation (Medical): No  ? Lack of Transportation (Non-Medical): No  ?Physical Activity: Sufficiently Active  ? Days of Exercise per Week: 5 days  ? Minutes of Exercise per Session: 30 min  ?Stress: No Stress Concern Present  ? Feeling of Stress : Only a little  ?Social Connections: Moderately Integrated  ? Frequency of Communication with Friends and Family: More than three times a week  ? Frequency of Social Gatherings with Friends and Family: More than three times a week  ? Attends Religious Services: Never  ? Active Member of Clubs or  Organizations: Yes  ? Attends Archivist Meetings: More than 4 times per year  ? Marital Status: Married  ?Intimate Partner Violence: Not At Risk  ? Fear of Current or Ex-Partner: No  ? Emotionally Abused: No  ? Physically Abused: No  ? Sexually Abused: No  ? ?Family History  ?Problem Relation Age of Onset  ? Heart disease Mother   ? Cancer Mother   ?     breast  ? Stroke Father 18  ? Hypertension Father   ? Early death Sister   ?  infant death  ? Heart disease Brother   ? Hydrocephalus Brother   ? Heart disease Brother   ? Diabetes Brother   ? Cancer Brother   ?     prostat  ? Healthy Son   ? Healthy Son   ? ? ?Objective: ?Office vital signs reviewed. ?BP 126/71   Pulse 66   Temp 98.3 ?F (36.8 ?C)   Ht 5' 5"  (1.651 m)   Wt 176 lb 12.8 oz (80.2 kg)   SpO2 98%   BMI 29.42 kg/m?  ? ?Physical Examination:  ?General: Awake, alert, well nourished, No acute distress ?HEENT: Moist mucous membranes.  Slight facial drooping ?Cardio: Bradycardic rate with seemingly regular rhythm, S1S2 heard, no murmurs appreciated ?Pulm: clear to auscultation bilaterally, no wheezes, rhonchi or rales; normal work of breathing on room air ?MSK: Unsteady gait.  Requires cane for ambulation ?Neuro: Very pleasant, interactive, interacts with provider ? ?Diabetic Foot Exam - Simple   ?Simple Foot Form ? 10/29/2021  8:27 PM  ?Visual Inspection ?See comments: Yes ?Sensation Testing ?Intact to touch and monofilament testing bilaterally: Yes ?Pulse Check ?Posterior Tibialis and Dorsalis pulse intact bilaterally: Yes ?Comments ?Onychomycotic changes to the feet ?  ? ? ? ?Assessment/ Plan: ?81 y.o. male  ? ?Type 2 diabetes mellitus with stage 3b chronic kidney disease, with long-term current use of insulin ([place]) ? ?Hypertension associated with diabetes ([place]) ? ?Hyperlipidemia associated with type 2 diabetes mellitus ([place]), goal LDL <70 ? ?Diabetic peripheral neuropathy associated with type 2 diabetes mellitus ([place]) ? ?Paroxysmal A-fib  ([place]) ? ?Sugar at goal for age.  Continue to follow-up with nephrology as directed. ? ?Blood pressure controlled.  No changes ? ?Continue current treatments with statin therapy for cholesterol control ? ?Patient

## 2021-11-06 ENCOUNTER — Other Ambulatory Visit: Payer: Self-pay | Admitting: Family Medicine

## 2021-11-24 ENCOUNTER — Other Ambulatory Visit: Payer: Self-pay | Admitting: Family Medicine

## 2021-11-24 DIAGNOSIS — E1169 Type 2 diabetes mellitus with other specified complication: Secondary | ICD-10-CM

## 2021-11-25 ENCOUNTER — Other Ambulatory Visit: Payer: Medicare Other

## 2021-11-25 DIAGNOSIS — E1122 Type 2 diabetes mellitus with diabetic chronic kidney disease: Secondary | ICD-10-CM | POA: Diagnosis not present

## 2021-11-25 DIAGNOSIS — I5032 Chronic diastolic (congestive) heart failure: Secondary | ICD-10-CM | POA: Diagnosis not present

## 2021-11-25 DIAGNOSIS — E1129 Type 2 diabetes mellitus with other diabetic kidney complication: Secondary | ICD-10-CM | POA: Diagnosis not present

## 2021-11-25 DIAGNOSIS — N189 Chronic kidney disease, unspecified: Secondary | ICD-10-CM | POA: Diagnosis not present

## 2021-11-25 DIAGNOSIS — R809 Proteinuria, unspecified: Secondary | ICD-10-CM | POA: Diagnosis not present

## 2021-11-26 ENCOUNTER — Ambulatory Visit (INDEPENDENT_AMBULATORY_CARE_PROVIDER_SITE_OTHER): Payer: Medicare Other | Admitting: Ophthalmology

## 2021-11-26 ENCOUNTER — Encounter (INDEPENDENT_AMBULATORY_CARE_PROVIDER_SITE_OTHER): Payer: Self-pay | Admitting: Ophthalmology

## 2021-11-26 ENCOUNTER — Telehealth: Payer: Self-pay | Admitting: Emergency Medicine

## 2021-11-26 DIAGNOSIS — H3412 Central retinal artery occlusion, left eye: Secondary | ICD-10-CM | POA: Diagnosis not present

## 2021-11-26 DIAGNOSIS — H43811 Vitreous degeneration, right eye: Secondary | ICD-10-CM | POA: Diagnosis not present

## 2021-11-26 DIAGNOSIS — H18232 Secondary corneal edema, left eye: Secondary | ICD-10-CM | POA: Diagnosis not present

## 2021-11-26 NOTE — Progress Notes (Signed)
? ? ?11/26/2021 ? ?  ? ?CHIEF COMPLAINT ?Patient presents for  ?Chief Complaint  ?Patient presents with  ? Retina Evaluation  ? ? ? ? ?HISTORY OF PRESENT ILLNESS: ?Evan Stanley is a 81 y.o. male who presents to the clinic today for:  ? ?HPI   ?1 yr fu OU FP. ?Patient states vision is stable and unchanged since last visit. Denies any new floaters or FOL. ?Patient uses Latanoprost OS QHS, and Atropine OS QAM, and he has used Polymyxin OS but it did not seem to help, from Dr. Elliot Dally. His eye waters. ?Last edited by Laurin Coder on 11/26/2021 10:07 AM.  ?  ? ? ?Referring physician: ?Clent Jacks, MD ?[place] ?[address] ?[place],  [place] [address] ? ?HISTORICAL INFORMATION:  ? ?Selected notes from the [facility] ?  ? ?Lab Results  ?Component Value Date  ? HGBA1C 7.2 (A) 09/17/2021  ?  ? ?CURRENT MEDICATIONS: ?Current Outpatient Medications (Ophthalmic Drugs)  ?Medication Sig  ? atropine 1 % ophthalmic solution Place 1 drop into the left eye 2 (two) times daily.  ? latanoprost (XALATAN) 0.005 % ophthalmic solution Place 1 drop into the left eye at bedtime.   ? ?No current facility-administered medications for this visit. (Ophthalmic Drugs)  ? ?Current Outpatient Medications (Other)  ?Medication Sig  ? acetaminophen (TYLENOL) 325 MG tablet Take 2 tablets (650 mg total) by mouth every 6 (six) hours as needed for mild pain (or Fever >/= 101).  ? atorvastatin (LIPITOR) 40 MG tablet TAKE 1 TABLET BY MOUTH DAILY  ? cetirizine (ZYRTEC) 10 MG tablet Take 1 tablet by mouth as needed for allergies. (Patient not taking: Reported on 09/17/2021)  ? cyanocobalamin 1000 MCG tablet Take 1,000 mcg by mouth daily.  ? Dulaglutide (TRULICITY) 1.5 KY/7.0WC SOPN Inject 1.5 mg into the skin once a week. Inject the contents of one pen once per week  ? ELIQUIS 5 MG TABS tablet TAKE 1 TABLET BY MOUTH  TWICE DAILY  ? hydrochlorothiazide (HYDRODIURIL) 25 MG tablet TAKE 1 TABLET BY MOUTH  DAILY  ? insulin aspart (NOVOLOG FLEXPEN) 100  UNIT/ML FlexPen Inject 6 Units into the skin daily. With supper if blood sugar is greater than 150  ? insulin degludec (TRESIBA FLEXTOUCH) 200 UNIT/ML FlexTouch Pen Inject 50 Units into the skin daily. (Patient not taking: Reported on 10/29/2021)  ? lisinopril (ZESTRIL) 20 MG tablet TAKE 1 TABLET BY MOUTH DAILY  ? metoprolol succinate (TOPROL-XL) 100 MG 24 hr tablet TAKE 1 TABLET BY MOUTH  DAILY WITH OR IMMEDIATELY  FOLLOWING A MEAL  ? ONETOUCH ULTRA test strip CHECK BLOOD SUGAR TWICE  DAILY  ? UNABLE TO FIND Diet: NAS and consistent carbohydrate  ? vitamin C (ASCORBIC ACID) 500 MG tablet Take 500 mg by mouth daily.  ? vitamin E 1000 UNIT capsule Take 1,000 Units by mouth daily.  ? ?No current facility-administered medications for this visit. (Other)  ? ? ? ? ?REVIEW OF SYSTEMS: ?ROS   ?Negative for: Constitutional, Gastrointestinal, Neurological, Skin, Genitourinary, Musculoskeletal, HENT, Endocrine, Cardiovascular, Eyes, Respiratory, Psychiatric, Allergic/Imm, Heme/Lymph ?Last edited by Hurman Horn, MD on 11/26/2021 11:11 AM.  ?  ? ? ? ?ALLERGIES ?Allergies  ?Allergen Reactions  ? Baclofen Other (See Comments)  ?  Stroke like symptoms  ? Morphine And Related Other (See Comments)  ?  Delirium   ? ? ?PAST MEDICAL HISTORY ?Past Medical History:  ?Diagnosis Date  ? AKI (acute kidney injury) ([facility])   ?  Allergy   ? Anxiety   ? Arthritis   ? Right shoulder  ? Atrial fibrillation ([place])   ? Carotid artery stenosis   ? Cataract   ? CHF (congestive heart failure) ([place])   ? CVA (cerebral vascular accident) ([place]) 04/28/2016  ? Decreased vision   ? left eye  ? Diabetes mellitus without complication ([place])   ? Takes Metformin  ? Hyperlipidemia   ? Hypertension   ? Salmonella bacteremia 04/29/2016  ? Sepsis ([place]) 04/29/2016  ? Stroke ([place]) 11/18/2015  ? no deficits  ? TIA (transient ischemic attack)   ? affected left eye  ? Urgency of urination   ? ?Past Surgical History:  ?Procedure Laterality Date  ? AORTIC VALVE REPLACEMENT    ?  BACK SURGERY  80s  ? CARDIAC VALVE REPLACEMENT  11/01/2008  ? 21-mm Edwards Pericardial Magna - Ease valve  ? ENDARTERECTOMY Right 01/13/2016  ? Procedure: ENDARTERECTOMY CAROTID - RIGHT;  Surgeon: Conrad [place], MD;  Location: [facility];  Service: Vascular;  Laterality: Right;  ? EYE SURGERY    ? laser surgery, left eye  Left 10-29-2015   ? retinal surgery/ Deloria Lair MD   ? PERIPHERAL VASCULAR CATHETERIZATION Right 11/18/2015  ? Procedure: Carotid Angiography;  Surgeon: Conrad Pemberwick, MD;  Location: [facility];  Service: Cardiovascular;  Laterality: Right;  ? PERIPHERAL VASCULAR CATHETERIZATION N/A 11/18/2015  ? Procedure: Aortic Arch Angiography;  Surgeon: Conrad , MD;  Location: [facility];  Service: Cardiovascular;  Laterality: N/A;  ? TEE WITHOUT CARDIOVERSION N/A 05/05/2016  ? Procedure: TRANSESOPHAGEAL ECHOCARDIOGRAM (TEE);  Surgeon: Lelon Perla, MD;  Location: [facility];  Service: Cardiovascular;  Laterality: N/A;  ? ? ?FAMILY HISTORY ?Family History  ?Problem Relation Age of Onset  ? Heart disease Mother   ? Cancer Mother   ?     breast  ? Stroke Father 8  ? Hypertension Father   ? Early death Sister   ?     infant death  ? Heart disease Brother   ? Hydrocephalus Brother   ? Heart disease Brother   ? Diabetes Brother   ? Cancer Brother   ?     prostat  ? Healthy Son   ? Healthy Son   ? ? ?SOCIAL HISTORY ?Social History  ? ?Tobacco Use  ? Smoking status: Former  ?  Packs/day: 2.00  ?  Years: 20.00  ?  Pack years: 40.00  ?  Types: Cigarettes  ?  Quit date: 07/20/1986  ?  Years since quitting: 35.3  ? Smokeless tobacco: Never  ?Vaping Use  ? Vaping Use: Never used  ?Substance Use Topics  ? Alcohol use: No  ?  Alcohol/week: 0.0 standard drinks  ? Drug use: No  ? ?  ? ?  ? ?OPHTHALMIC EXAM: ? ?Base Eye Exam   ? ? Visual Acuity (ETDRS)   ? ?   Right Left  ? Dist cc 20/20 HM  ? ? Correction: Glasses  ? ?  ?  ? ? Tonometry (Tonopen, 10:09 AM)   ? ?   Right Left  ? Pressure 9 15  ? ?  ?  ? ?  Pupils   ? ?   Dark Light Shape React APD  ? Right 4 3   None  ? Left 6 6 Irregular Minimal None  ? ?  ?  ? ? Visual Fields (Counting fingers)   ? ?   Left Right  ?  Full  ? Restrictions Total superior temporal, inferior temporal, superior nasal, inferior nasal deficiencies   ? ?  ?  ? ? Extraocular Movement   ? ?   Right Left  ?  Full Full  ? ?  ?  ? ? Neuro/Psych   ? ? Oriented x3: Yes  ? Mood/Affect: Normal  ? ?  ?  ? ? Dilation   ? ? Both eyes: 1.0% Mydriacyl, 2.5% Phenylephrine @ 10:09 AM  ? ?  ?  ? ?  ? ?Slit Lamp and Fundus Exam   ? ? External Exam   ? ?   Right Left  ? External Normal Normal  ? ?  ?  ? ? Slit Lamp Exam   ? ?   Right Left  ? Lids/Lashes Normal Normal  ? Conjunctiva/Sclera White and quiet White and quiet, well covered tube and footplate of shunt superotemporally  ? Cornea Clear Opacity: Central, 3+ Edema  ? Anterior Chamber Deep and quiet Tube at 130-2 meridian, with high PAS  ? Iris Round and reactive Oval, not reactive  ? Lens Posterior chamber intraocular lens Posterior chamber intraocular lens  ? Anterior Vitreous Normal Normal  ? ?  ?  ? ? Fundus Exam   ? ?   Right Left  ? Posterior Vitreous Posterior vitreous detachment No view posteriorly  ? Disc Normal   ? C/D Ratio 0.35   ? Macula Normal   ? Vessels Normal   ? Periphery Normal   ? ?  ?  ? ?  ? ? ?IMAGING AND PROCEDURES  ?Imaging and Procedures for 11/26/21 ? ?OCT, Retina - OU - Both Eyes   ? ?   ?Right Eye ?Quality was good. Scan locations included subfoveal. Central Foveal Thickness: 279. Findings include normal observations.  ? ?Left Eye ?Quality was poor.  ? ?Notes ?OD with posterior vitreous detachment ? ?OS cloudy media ? ?  ? ? ?  ?  ? ?  ?ASSESSMENT/PLAN: ? ?Central retinal artery occlusion, left eye ?OS controlled IOP yet corneal edema likely from poor ciliary body perfusion.  No treatment required ? ?The globe is comfortable  ? ?  ICD-10-CM   ?1. Posterior vitreous detachment of right eye  H43.811 OCT, Retina - OU - Both  Eyes  ?  Color Fundus Photography Optos - OU - Both Eyes  ?  ?2. Central retinal artery occlusion, left eye  H34.12   ?  ?3. Secondary corneal edema of left eye  H18.232   ?  ? ? ?1.  OD stable over time.  [place]

## 2021-11-26 NOTE — Telephone Encounter (Signed)
Called patient and LM to call office about picking up sample of Novolin.  ?

## 2021-11-26 NOTE — Assessment & Plan Note (Addendum)
OS controlled IOP yet corneal edema likely from poor ciliary body perfusion.  No treatment required ? ?The globe is comfortable ?

## 2021-12-03 DIAGNOSIS — I5032 Chronic diastolic (congestive) heart failure: Secondary | ICD-10-CM | POA: Diagnosis not present

## 2021-12-03 DIAGNOSIS — N189 Chronic kidney disease, unspecified: Secondary | ICD-10-CM | POA: Diagnosis not present

## 2021-12-03 DIAGNOSIS — R809 Proteinuria, unspecified: Secondary | ICD-10-CM | POA: Diagnosis not present

## 2021-12-03 DIAGNOSIS — E870 Hyperosmolality and hypernatremia: Secondary | ICD-10-CM | POA: Diagnosis not present

## 2021-12-03 DIAGNOSIS — E1129 Type 2 diabetes mellitus with other diabetic kidney complication: Secondary | ICD-10-CM | POA: Diagnosis not present

## 2021-12-03 DIAGNOSIS — I129 Hypertensive chronic kidney disease with stage 1 through stage 4 chronic kidney disease, or unspecified chronic kidney disease: Secondary | ICD-10-CM | POA: Diagnosis not present

## 2021-12-03 DIAGNOSIS — E1122 Type 2 diabetes mellitus with diabetic chronic kidney disease: Secondary | ICD-10-CM | POA: Diagnosis not present

## 2021-12-09 NOTE — Progress Notes (Unsigned)
Cardiology Office Note:    Date:  12/10/2021   ID:  Evan Stanley, DOB [birth year], MRN [id number]  PCP:  Evan Norlander, DO  Cardiologist:  Evan Grooms, MD   Referring MD: Evan Norlander, DO   Chief Complaint  Patient presents with   Coronary Artery Disease   Congestive Heart Failure   Cardiac Valve Problem    Bioprosthetic aortic valve    History of Present Illness:    Evan Stanley is a 81 y.o. male with a hx of bioprosthetic aortic valve 2010, hyperlipidemia, coronary artery disease with prior bypass grafting 2010, left ventricular systolic dysfunction, right cerebral CVA (2017), total occlusion left ICA, RCEA, and  DM type II.   He is doing well.  Wife is concerned that memory seems to be an issue.  He is unable to play golf or do much physical activity because of balance issues that have occurred since his strokes over the past 3 to 5 years.  He has had at least 3 strokes with the most recent being about a year ago.  He denies angina, orthopnea, PND, syncope, palpitations, and peripheral edema.  Past Medical History:  Diagnosis Date   AKI (acute kidney injury) [facility])    Allergy    Anxiety    Arthritis    Right shoulder   Atrial fibrillation (HCC)    Carotid artery stenosis    Cataract    CHF (congestive heart failure) (HCC)    CVA (cerebral vascular accident) ([place]) 04/28/2016   Decreased vision    left eye   Diabetes mellitus without complication ([place])    Takes Metformin   Hyperlipidemia    Hypertension    Salmonella bacteremia 04/29/2016   Sepsis ([place]) 04/29/2016   Stroke ([facility]) 11/18/2015   no deficits   TIA (transient ischemic attack)    affected left eye   Urgency of urination     Past Surgical History:  Procedure Laterality Date   AORTIC VALVE REPLACEMENT     BACK SURGERY  80s   CARDIAC VALVE REPLACEMENT  11/01/2008   21-mm Edwards Pericardial Magna - Ease valve   ENDARTERECTOMY Right 01/13/2016   Procedure: ENDARTERECTOMY CAROTID -  RIGHT;  Surgeon: Conrad Kiel, MD;  Location: [facility];  Service: Vascular;  Laterality: Right;   EYE SURGERY     laser surgery, left eye  Left 10-29-2015    retinal surgery/ Deloria Lair MD    PERIPHERAL VASCULAR CATHETERIZATION Right 11/18/2015   Procedure: Carotid Angiography;  Surgeon: Conrad [place], MD;  Location: [facility];  Service: Cardiovascular;  Laterality: Right;   PERIPHERAL VASCULAR CATHETERIZATION N/A 11/18/2015   Procedure: Aortic Arch Angiography;  Surgeon: Conrad Hanahan, MD;  Location: [facility];  Service: Cardiovascular;  Laterality: N/A;   TEE WITHOUT CARDIOVERSION N/A 05/05/2016   Procedure: TRANSESOPHAGEAL ECHOCARDIOGRAM (TEE);  Surgeon: Lelon Perla, MD;  Location: [facility];  Service: Cardiovascular;  Laterality: N/A;    Current Medications: Current Meds  Medication Sig   acetaminophen (TYLENOL) 325 MG tablet Take 2 tablets (650 mg total) by mouth every 6 (six) hours as needed for mild pain (or Fever >/= 101).   atorvastatin (LIPITOR) 40 MG tablet TAKE 1 TABLET BY MOUTH DAILY   atropine 1 % ophthalmic solution Place 1 drop into the left eye 2 (two) times daily.   cetirizine (ZYRTEC) 10 MG tablet Take 1 tablet by mouth as needed for allergies.   cyanocobalamin 1000 MCG  tablet Take 1,000 mcg by mouth daily.   Dulaglutide (TRULICITY) 1.5 ZD/6.6YQ SOPN Inject 1.5 mg into the skin once a week. Inject the contents of one pen once per week   ELIQUIS 5 MG TABS tablet TAKE 1 TABLET BY MOUTH  TWICE DAILY   hydrochlorothiazide (HYDRODIURIL) 25 MG tablet TAKE 1 TABLET BY MOUTH  DAILY   insulin aspart (NOVOLOG) 100 UNIT/ML injection Inject 9 Units into the skin in the morning and at bedtime. Pt takes 9 units before breakfast, and 8 units before supper.   insulin degludec (TRESIBA FLEXTOUCH) 200 UNIT/ML FlexTouch Pen Inject 52 Units into the skin daily.   latanoprost (XALATAN) 0.005 % ophthalmic solution Place 1 drop into the left eye at bedtime.    lisinopril  (ZESTRIL) 20 MG tablet TAKE 1 TABLET BY MOUTH DAILY   metoprolol succinate (TOPROL-XL) 100 MG 24 hr tablet TAKE 1 TABLET BY MOUTH  DAILY WITH OR IMMEDIATELY  FOLLOWING A MEAL   ONETOUCH ULTRA test strip CHECK BLOOD SUGAR TWICE  DAILY   UNABLE TO FIND Diet: NAS and consistent carbohydrate   vitamin C (ASCORBIC ACID) 500 MG tablet Take 500 mg by mouth daily.   vitamin E 1000 UNIT capsule Take 1,000 Units by mouth daily.     Allergies:   Baclofen and Morphine and related   Social History   Socioeconomic History   Marital status: Married    Spouse name: Evan Stanley   Number of children: 2   Years of education: 11   Highest education level: 11th grade  Occupational History   Occupation: maintenance    Employer: [employer]    Comment: Retired  Tobacco Use   Smoking status: Former    Packs/day: 2.00    Years: 20.00    Pack years: 40.00    Types: Cigarettes    Quit date: 07/20/1986    Years since quitting: 35.4   Smokeless tobacco: Never  Vaping Use   Vaping Use: Never used  Substance and Sexual Activity   Alcohol use: No    Alcohol/week: 0.0 standard drinks   Drug use: No   Sexual activity: Yes  Other Topics Concern   Not on file  Social History Narrative   Lives with wife in home   Right Handed   Drinks 6-7 cups caffeine daily   Social Determinants of Health   Financial Resource Strain: Medium Risk   Difficulty of Paying Living Expenses: Somewhat hard  Food Insecurity: No Food Insecurity   Worried About Charity fundraiser in the Last Year: Never true   Ran Out of Food in the Last Year: Never true  Transportation Needs: No Transportation Needs   Lack of Transportation (Medical): No   Lack of Transportation (Non-Medical): No  Physical Activity: Sufficiently Active   Days of Exercise per Week: 5 days   Minutes of Exercise per Session: 30 min  Stress: No Stress Concern Present   Feeling of Stress : Only a little  Social Connections: Moderately Integrated   Frequency of  Communication with Friends and Family: More than three times a week   Frequency of Social Gatherings with Friends and Family: More than three times a week   Attends Religious Services: Never   Marine scientist or Organizations: Yes   Attends Music therapist: More than 4 times per year   Marital Status: Married     Family History: The patient's family history includes Cancer in his brother and mother; Diabetes in his brother; Early  death in his sister; Healthy in his son and son; Heart disease in his brother, brother, and mother; Hydrocephalus in his brother; Hypertension in his father; Stroke (age of onset: 44) in his father.  ROS:   Please see the history of present illness.    Difficulty with balance prevents him from golfing which is been one of his favorite activities.  Decreased memory according to the wife.  This is very troubling to her.  Dr. Krista Blue is the neurologist.  [place] and characterizes the patient's dementia and she feels it is degenerative and related to brain volume loss and vascular component.  She recommended discontinuation of driving.  Nephrology is Dr. Theador Hawthorne who states that the patient is stable.  All other systems reviewed and are negative.  EKGs/Labs/Other Studies Reviewed:    The following studies were reviewed today: 2D Doppler echocardiogram 2021: IMPRESSIONS   1. Left ventricular ejection fraction, by estimation, is 65 to 70%. The  left ventricle has normal function. The left ventricle has no regional  wall motion abnormalities. There is moderate left ventricular hypertrophy.  Left ventricular diastolic  parameters are consistent with Grade I diastolic dysfunction (impaired  relaxation). Elevated left ventricular end-diastolic pressure.   2. Right ventricular systolic function is low normal. The right  ventricular size is normal.   3. The mitral valve is abnormal. Trivial mitral valve regurgitation.   4. The aortic valve has been  repaired/replaced with a bioprosthetic  valve. Aortic valve regurgitation is not visualized. Aortic valve area, by  VTI measures 2.69 cm. Aortic valve mean gradient measures 8.0 mmHg. Peak  gradient is 13.5 mmHg. Aortic valve  Vmax measures 1.84 m/s.   Comparison(s): 05/15/2019: LVEF 60-65%, aortic valve mean gradient 13  mmHg, peak gradient 24 mmHg.    EKG:  EKG normal sinus rhythm with tall R wave in V1 (?  Incomplete right bundle branch block) and early QRS transition.  Nonspecific T wave abnormality.  When compared to the prior tracing performed on 05/06/2020, the tall R wave in V1 is new.  Recent Labs: 04/04/2021: ALT 22; BUN 25; Creatinine, Ser 1.91; Hemoglobin 15.0; Platelets 169; Potassium 5.0; Sodium 143  Recent Lipid Panel    Component Value Date/Time   CHOL 116 07/02/2021 1030   TRIG 141 07/02/2021 1030   HDL 37 (L) 07/02/2021 1030   CHOLHDL 3.1 07/02/2021 1030   CHOLHDL 3.0 12/25/2019 0257   VLDL 12 12/25/2019 0257   LDLCALC 54 07/02/2021 1030   LDLDIRECT 62 02/16/2017 1151    Physical Exam:    VS:  BP 122/62   Pulse 74   Ht 5' 5"  (1.651 m)   Wt 174 lb 6.4 oz (79.1 kg)   SpO2 96%   BMI 29.02 kg/m     Wt Readings from Last 3 Encounters:  12/10/21 174 lb 6.4 oz (79.1 kg)  10/29/21 176 lb 12.8 oz (80.2 kg)  09/17/21 177 lb (80.3 kg)     GEN: Overweight and with appearance that is consistent with age.. No acute distress HEENT: Normal NECK: No JVD. LYMPHATICS: No lymphadenopathy CARDIAC: 2/6 right upper sternal systolic but no diastolic murmur. RRR no gallop, or edema. VASCULAR:  Normal Pulses. No bruits. RESPIRATORY:  Clear to auscultation without rales, wheezing or rhonchi  ABDOMEN: Soft, non-tender, non-distended, No pulsatile mass, MUSCULOSKELETAL: No deformity  SKIN: Warm and dry NEUROLOGIC:  Alert and oriented x 3 PSYCHIATRIC:  Normal affect   ASSESSMENT:    1. Coronary artery disease involving native coronary artery of  native heart without  angina pectoris   2. S/P aortic valve replacement with tissue   3. PAD (peripheral artery disease) ([place])   4. CKD stage 3 secondary to diabetes ([place])   5. Mixed hyperlipidemia   6. PAF (paroxysmal atrial fibrillation) ([place])   7. Type 2 diabetes mellitus with stage 3a chronic kidney disease, without long-term current use of insulin ([place])   8. Chronic anticoagulation    PLAN:    In order of problems listed above:  Secondary prevention reviewed 2D echocardiogram in 1 year to follow-up on leaflet integrity of his bioprosthetic valve. No complaints currently.  Secondary prevention discussed. Continue to follow with nephrology, Dr. Theador Hawthorne Continue aggressive lipid-lowering with Lipitor 40 mg/day.  Most recent LDL was 54. No recurrent episodes.  Continue Eliquis. Continue to follow with Dr. Dwyane Dee.  A downstream consideration would be SGLT2 therapy for cardiovascular protection and further lowering of blood sugars. Continue apixaban.   Medication Adjustments/Labs and Tests Ordered: Current medicines are reviewed at length with the patient today.  Concerns regarding medicines are outlined above.  Orders Placed This Encounter  Procedures   EKG 12-Lead   ECHOCARDIOGRAM COMPLETE   No orders of the defined types were placed in this encounter.   Patient Instructions  Medication Instructions:  Your physician recommends that you continue on your current medications as directed. Please refer to the Current Medication list given to you today.  *If you need a refill on your cardiac medications before your next appointment, please call your pharmacy*  Lab Work: NONE  Testing/Procedures: Your physician has requested that you have an echocardiogram 1 month before office visit in May 2024. Echocardiography is a painless test that uses sound waves to create images of your heart. It provides your doctor with information about the size and shape of your heart and how well your heart's chambers and  valves are working. This procedure takes approximately one hour. There are no restrictions for this procedure.   Follow-Up: At [facility], you and your health needs are our priority.  As part of our continuing mission to provide you with exceptional heart care, we have created designated Provider Care Teams.  These Care Teams include your primary Cardiologist (physician) and Advanced Practice Providers (APPs -  Physician Assistants and Nurse Practitioners) who all work together to provide you with the care you need, when you need it.  Your next appointment:   1 year(s)  The format for your next appointment:   In Person  Provider:   Sinclair Grooms, MD {   Important Information About Sugar         Signed, Evan Grooms, MD  12/10/2021 11:44 AM    [place]

## 2021-12-10 ENCOUNTER — Encounter: Payer: Self-pay | Admitting: Interventional Cardiology

## 2021-12-10 ENCOUNTER — Ambulatory Visit: Payer: Medicare Other | Admitting: Interventional Cardiology

## 2021-12-10 VITALS — BP 122/62 | HR 74 | Ht 65.0 in | Wt 174.4 lb

## 2021-12-10 DIAGNOSIS — E1122 Type 2 diabetes mellitus with diabetic chronic kidney disease: Secondary | ICD-10-CM | POA: Diagnosis not present

## 2021-12-10 DIAGNOSIS — Z953 Presence of xenogenic heart valve: Secondary | ICD-10-CM

## 2021-12-10 DIAGNOSIS — E782 Mixed hyperlipidemia: Secondary | ICD-10-CM | POA: Diagnosis not present

## 2021-12-10 DIAGNOSIS — N183 Chronic kidney disease, stage 3 unspecified: Secondary | ICD-10-CM

## 2021-12-10 DIAGNOSIS — Z7901 Long term (current) use of anticoagulants: Secondary | ICD-10-CM

## 2021-12-10 DIAGNOSIS — I739 Peripheral vascular disease, unspecified: Secondary | ICD-10-CM | POA: Diagnosis not present

## 2021-12-10 DIAGNOSIS — N1831 Chronic kidney disease, stage 3a: Secondary | ICD-10-CM | POA: Diagnosis not present

## 2021-12-10 DIAGNOSIS — I2510 Atherosclerotic heart disease of native coronary artery without angina pectoris: Secondary | ICD-10-CM | POA: Diagnosis not present

## 2021-12-10 DIAGNOSIS — I480 Paroxysmal atrial fibrillation: Secondary | ICD-10-CM | POA: Diagnosis not present

## 2021-12-10 NOTE — Patient Instructions (Signed)
Medication Instructions:  Your physician recommends that you continue on your current medications as directed. Please refer to the Current Medication list given to you today.  *If you need a refill on your cardiac medications before your next appointment, please call your pharmacy*  Lab Work: NONE  Testing/Procedures: Your physician has requested that you have an echocardiogram 1 month before office visit in May 2024. Echocardiography is a painless test that uses sound waves to create images of your heart. It provides your doctor with information about the size and shape of your heart and how well your heart's chambers and valves are working. This procedure takes approximately one hour. There are no restrictions for this procedure.   Follow-Up: At [facility], you and your health needs are our priority.  As part of our continuing mission to provide you with exceptional heart care, we have created designated Provider Care Teams.  These Care Teams include your primary Cardiologist (physician) and Advanced Practice Providers (APPs -  Physician Assistants and Nurse Practitioners) who all work together to provide you with the care you need, when you need it.  Your next appointment:   1 year(s)  The format for your next appointment:   In Person  Provider:   Sinclair Grooms, MD {   Important Information About Sugar

## 2021-12-11 DIAGNOSIS — E1142 Type 2 diabetes mellitus with diabetic polyneuropathy: Secondary | ICD-10-CM | POA: Diagnosis not present

## 2021-12-11 DIAGNOSIS — L84 Corns and callosities: Secondary | ICD-10-CM | POA: Diagnosis not present

## 2021-12-11 DIAGNOSIS — B351 Tinea unguium: Secondary | ICD-10-CM | POA: Diagnosis not present

## 2021-12-11 DIAGNOSIS — M79676 Pain in unspecified toe(s): Secondary | ICD-10-CM | POA: Diagnosis not present

## 2021-12-18 ENCOUNTER — Ambulatory Visit: Payer: Medicare Other | Admitting: Endocrinology

## 2021-12-18 ENCOUNTER — Encounter: Payer: Self-pay | Admitting: Endocrinology

## 2021-12-18 VITALS — BP 126/64 | HR 68 | Ht 65.0 in | Wt 172.0 lb

## 2021-12-18 DIAGNOSIS — I10 Essential (primary) hypertension: Secondary | ICD-10-CM

## 2021-12-18 DIAGNOSIS — E1165 Type 2 diabetes mellitus with hyperglycemia: Secondary | ICD-10-CM | POA: Diagnosis not present

## 2021-12-18 DIAGNOSIS — Z794 Long term (current) use of insulin: Secondary | ICD-10-CM | POA: Diagnosis not present

## 2021-12-18 LAB — POCT GLYCOSYLATED HEMOGLOBIN (HGB A1C): Hemoglobin A1C: 6.6 % — AB (ref 4.0–5.6)

## 2021-12-18 NOTE — Progress Notes (Unsigned)
Patient ID: Evan Stanley, male   DOB: 1941-06-25, 81 y.o.   MRN: [id number]          Reason for Appointment: Follow-up for Type 2 Diabetes    History of Present Illness:          Date of diagnosis of type 2 diabetes mellitus: 2010       Background history:   He thinks he was diagnosed to have diabetes after his coronary bypass surgery and blood sugars are not very high He had been on metformin until about 05/2017 Also at some point had taken glimepiride Usually appears to have had A1c just above 7%  Recent history:   INSULIN regimen is: [place] 52 units daily, Novolog 6 units at breakfast and 8 units at dinner  Non-insulin hypoglycemic drugs the patient is taking are: Trulicity 1.5 mg weekly  His A1c is back down to 6.6    Current management, blood sugar patterns and problems identified: His NovoLog was supposed to be increased in the morning but he only takes a higher dose of 9 units when he is eating pancakes or waffles Again unclear what his blood sugars are after breakfast as he continues to take only readings in the mornings or after dinner  However with A1c improving likely has more consistent with diet and control including after dinner  Also his weight is somewhat better The fasting blood sugar is low normal at times average blood sugar in the mornings also lower Recently has had hypoglycemia.  Eating meals but not recently        Side effects from medications have been: None  Glucose monitoring:  done about 2 times a day         Glucometer: One Touch ultra 2.       Blood Glucose readings by review of home monitor download show   PRE-MEAL Fasting Lunch Dinner Bedtime Overall  Glucose range: 70-130      Mean/median: 97    105   POST-MEAL PC Breakfast PC Lunch PC Dinner  Glucose range:   58-201   Mean/median:   112    Previously: PRE-MEAL Fasting Lunch Dinner Bedtime Overall  Glucose range: 89-175      Mean/median: 115    127   POST-MEAL PC Breakfast PC  Lunch PC Dinner  Glucose range: ?  78-239  Mean/median:        Self-care: The diet that the patient has been following is: tries to limit sweets.     Meal times are:  Breakfast is at after 9 AM, dinner around 6 PM  Typical meal intake: Breakfast is eggs and meat and toast, sometimes waffles            Dietician visit, most recent: 2019                Weight history:  Wt Readings from Last 3 Encounters:  12/18/21 172 lb (78 kg)  12/10/21 174 lb 6.4 oz (79.1 kg)  10/29/21 176 lb 12.8 oz (80.2 kg)    Glycemic control:  Urine albumin from nephrologist: 476, overall stable  Lab Results  Component Value Date   HGBA1C 6.6 (A) 12/18/2021   HGBA1C 7.2 (A) 09/17/2021   HGBA1C 6.5 (H) 07/02/2021   Lab Results  Component Value Date   MICROALBUR 14.4 (H) 08/12/2020   LDLCALC 54 07/02/2021   CREATININE 1.91 (H) 04/04/2021   Lab Results  Component Value Date   MICRALBCREAT 13.5 08/12/2020    Lab Results  Component Value Date   FRUCTOSAMINE 274 08/12/2020   FRUCTOSAMINE 323 (H) 04/22/2018   Lab Results  Component Value Date   HGB 15.0 04/04/2021      Allergies as of 12/18/2021       Reactions   Baclofen Other (See Comments)   Stroke like symptoms   Morphine And Related Other (See Comments)   Delirium         Medication List        Accurate as of December 18, 2021 11:59 PM. If you have any questions, ask your nurse or doctor.          acetaminophen 325 MG tablet Commonly known as: TYLENOL Take 2 tablets (650 mg total) by mouth every 6 (six) hours as needed for mild pain (or Fever >/= 101).   atorvastatin 40 MG tablet Commonly known as: LIPITOR TAKE 1 TABLET BY MOUTH DAILY   atropine 1 % ophthalmic solution Place 1 drop into the left eye 2 (two) times daily.   cetirizine 10 MG tablet Commonly known as: ZYRTEC Take 1 tablet by mouth as needed for allergies.   cyanocobalamin 1000 MCG tablet Take 1,000 mcg by mouth daily.   Eliquis 5 MG Tabs  tablet Generic drug: apixaban TAKE 1 TABLET BY MOUTH  TWICE DAILY   hydrochlorothiazide 25 MG tablet Commonly known as: HYDRODIURIL TAKE 1 TABLET BY MOUTH  DAILY   insulin aspart 100 UNIT/ML injection Commonly known as: novoLOG Inject 9 Units into the skin in the morning and at bedtime. Pt takes 9 units before breakfast, and 8 units before supper.   latanoprost 0.005 % ophthalmic solution Commonly known as: XALATAN Place 1 drop into the left eye at bedtime.   lisinopril 20 MG tablet Commonly known as: ZESTRIL TAKE 1 TABLET BY MOUTH DAILY   metoprolol succinate 100 MG 24 hr tablet Commonly known as: TOPROL-XL TAKE 1 TABLET BY MOUTH  DAILY WITH OR IMMEDIATELY  FOLLOWING A MEAL   OneTouch Ultra test strip Generic drug: glucose blood CHECK BLOOD SUGAR TWICE  DAILY   Tresiba FlexTouch 200 UNIT/ML FlexTouch Pen Generic drug: insulin degludec Inject 52 Units into the skin daily.   Trulicity 1.5 JS/2.8BT Sopn Generic drug: Dulaglutide Inject 1.5 mg into the skin once a week. Inject the contents of one pen once per week   UNABLE TO FIND Diet: NAS and consistent carbohydrate   vitamin C 500 MG tablet Commonly known as: ASCORBIC ACID Take 500 mg by mouth daily.   vitamin E 1000 UNIT capsule Take 1,000 Units by mouth daily.        Allergies:  Allergies  Allergen Reactions   Baclofen Other (See Comments)    Stroke like symptoms   Morphine And Related Other (See Comments)    Delirium     Past Medical History:  Diagnosis Date   AKI (acute kidney injury) ([place])    Allergy    Anxiety    Arthritis    Right shoulder   Atrial fibrillation ([facility])    Carotid artery stenosis    Cataract    CHF (congestive heart failure) (H[facility])    CVA (cerebral vascular accident) ([place]) 04/28/2016   Decreased vision    left eye   Diabetes mellitus without complication ([place])    Takes Metformin   Hyperlipidemia    Hypertension    Salmonella bacteremia 04/29/2016   Sepsis ([place])  04/29/2016   Stroke ([place]) 11/18/2015   no deficits   TIA (transient ischemic attack)  affected left eye   Urgency of urination     Past Surgical History:  Procedure Laterality Date   AORTIC VALVE REPLACEMENT     BACK SURGERY  80s   CARDIAC VALVE REPLACEMENT  11/01/2008   21-mm Edwards Pericardial Magna - Ease valve   ENDARTERECTOMY Right 01/13/2016   Procedure: ENDARTERECTOMY CAROTID - RIGHT;  Surgeon: Conrad Brookneal, MD;  Location: [facility];  Service: Vascular;  Laterality: Right;   EYE SURGERY     laser surgery, left eye  Left 10-29-2015    retinal surgery/ Deloria Lair MD    PERIPHERAL VASCULAR CATHETERIZATION Right 11/18/2015   Procedure: Carotid Angiography;  Surgeon: Conrad [place], MD;  Location: [facility];  Service: Cardiovascular;  Laterality: Right;   PERIPHERAL VASCULAR CATHETERIZATION N/A 11/18/2015   Procedure: Aortic Arch Angiography;  Surgeon: Conrad Linndale, MD;  Location: [facility];  Service: Cardiovascular;  Laterality: N/A;   TEE WITHOUT CARDIOVERSION N/A 05/05/2016   Procedure: TRANSESOPHAGEAL ECHOCARDIOGRAM (TEE);  Surgeon: Lelon Perla, MD;  Location: [facility] ENDOSCOPY;  Service: Cardiovascular;  Laterality: N/A;    Family History  Problem Relation Age of Onset   Heart disease Mother    Cancer Mother        breast   Stroke Father 35   Hypertension Father    Early death Sister        infant death   Heart disease Brother    Hydrocephalus Brother    Heart disease Brother    Diabetes Brother    Cancer Brother        prostat   Healthy Son    Healthy Son     Social History:  reports that he quit smoking about 35 years ago. His smoking use included cigarettes. He has a 40.00 pack-year smoking history. He has never used smokeless tobacco. He reports that he does not drink alcohol and does not use drugs.   Review of Systems   Lipid history: Lipids controlled well on atorvastatin 40 mg daily prescribed by his PCP   Has history of CAD and CVD     Lab Results  Component Value Date   CHOL 116 07/02/2021   HDL 37 (L) 07/02/2021   LDLCALC 54 07/02/2021   LDLDIRECT 62 02/16/2017   TRIG 141 07/02/2021   CHOLHDL 3.1 07/02/2021           Hypertension:  His treatment includes hydrochlorothiazide 25 mg and lisinopril 20 mg daily,   prescribed by PCP He does monitor BP at home   BP Readings from Last 3 Encounters:  12/18/21 126/64  12/10/21 122/62  10/29/21 126/71     CKD: Creatinine variable as follows Regularly seen by nephrologist Recent creatinine was 1.73  Lab Results  Component Value Date   CREATININE 1.91 (H) 04/04/2021   CREATININE 1.90 (H) 03/18/2021   CREATININE 1.71 (H) 08/12/2020     Physical Examination:  BP 126/64   Pulse 68   Ht 5' 5"  (1.651 m)   Wt 172 lb (78 kg)   SpO2 98%   BMI 28.62 kg/m       ASSESSMENT:  Diabetes type 2, on insulin   See history of present illness for detailed discussion of current diabetes management, blood sugar patterns and problems identified  His A1c is 6.6, has been as low as 6.2 before  He is on basal bolus insulin regimen and Trulicity 1.5 mg weekly  He is having better control of likely from some  improvement in diet Also may be not getting as many high carbohydrate meals  However likely still has high postprandial readings including after breakfast which she does not monitor Also although less recently is somewhat low after dinnertime  HYPERTENSION: Well-controlled    Renal insufficiency: Stable, will follow-up with nephrologist   PLAN:   He will reduce Tresiba down to 48 because of low normal fasting readings, likely probably has low normal reading before dinner also  Likely needs at least 8 units to cover breakfast and 10 units for waffles and pancakes If his blood sugars are still low normal after dinner he can reduce suppertime dose by 2 units He is somewhat more agreeable to using the freestyle libre system and discussed how this works, he will  likely need to use the reader We will send a authorization to [employer] with his demographic information; he will be able to decide based on his out-of-pocket expense Meanwhile discussed checking blood sugars consistently after breakfast and not every day in the morning after waking up Discussed blood sugar targets before and after eating Make sure he has some protein with every meal   Patient Instructions  Check blood sugars on waking up days a week  Also check blood sugars about 2 hours after meals and do this after different meals by rotation  Recommended blood sugar levels on waking up are 90-130 and about 2 hours after meal is 130-160  Please bring your blood sugar monitor to each visit, thank you  Tresiba 48  Novolog 8 units at [facility] and 10 for pancackes    Total visit time including counseling = 30 minutes   [name] 12/19/2021, 10:35 AM   Note: This office note was prepared with Dragon voice recognition system technology. Any transcriptional errors that result from this process are unintentional.

## 2021-12-18 NOTE — Patient Instructions (Addendum)
Check blood sugars on waking up days a week  Also check blood sugars about 2 hours after meals and do this after different meals by rotation  Recommended blood sugar levels on waking up are 90-130 and about 2 hours after meal is 130-160  Please bring your blood sugar monitor to each visit, thank you  Tresiba 48  Novolog 8 units at [facility] and 10 for pancackes

## 2022-01-13 ENCOUNTER — Other Ambulatory Visit: Payer: Self-pay | Admitting: Family Medicine

## 2022-02-09 ENCOUNTER — Other Ambulatory Visit: Payer: Self-pay | Admitting: Family Medicine

## 2022-02-09 DIAGNOSIS — E1159 Type 2 diabetes mellitus with other circulatory complications: Secondary | ICD-10-CM

## 2022-02-16 ENCOUNTER — Other Ambulatory Visit: Payer: Self-pay | Admitting: Family Medicine

## 2022-02-16 DIAGNOSIS — E1169 Type 2 diabetes mellitus with other specified complication: Secondary | ICD-10-CM

## 2022-02-23 ENCOUNTER — Telehealth: Payer: Self-pay | Admitting: Family Medicine

## 2022-02-23 NOTE — Telephone Encounter (Signed)
Sample left in fridge

## 2022-02-23 NOTE — Telephone Encounter (Signed)
Pt aware no patient assistance

## 2022-02-25 ENCOUNTER — Telehealth: Payer: Self-pay | Admitting: Family Medicine

## 2022-02-25 NOTE — Telephone Encounter (Signed)
Pt wife aware sample is ready

## 2022-03-02 ENCOUNTER — Encounter: Payer: Self-pay | Admitting: Family Medicine

## 2022-03-02 ENCOUNTER — Ambulatory Visit (INDEPENDENT_AMBULATORY_CARE_PROVIDER_SITE_OTHER): Payer: Medicare Other | Admitting: Family Medicine

## 2022-03-02 VITALS — BP 105/62 | HR 63 | Temp 97.8°F | Ht 65.0 in | Wt 165.6 lb

## 2022-03-02 DIAGNOSIS — E1169 Type 2 diabetes mellitus with other specified complication: Secondary | ICD-10-CM

## 2022-03-02 DIAGNOSIS — I152 Hypertension secondary to endocrine disorders: Secondary | ICD-10-CM

## 2022-03-02 DIAGNOSIS — E1159 Type 2 diabetes mellitus with other circulatory complications: Secondary | ICD-10-CM | POA: Diagnosis not present

## 2022-03-02 DIAGNOSIS — N1832 Chronic kidney disease, stage 3b: Secondary | ICD-10-CM | POA: Diagnosis not present

## 2022-03-02 DIAGNOSIS — E785 Hyperlipidemia, unspecified: Secondary | ICD-10-CM

## 2022-03-02 DIAGNOSIS — F01B11 Vascular dementia, moderate, with agitation: Secondary | ICD-10-CM | POA: Insufficient documentation

## 2022-03-02 DIAGNOSIS — I480 Paroxysmal atrial fibrillation: Secondary | ICD-10-CM

## 2022-03-02 DIAGNOSIS — E1122 Type 2 diabetes mellitus with diabetic chronic kidney disease: Secondary | ICD-10-CM | POA: Diagnosis not present

## 2022-03-02 DIAGNOSIS — E1142 Type 2 diabetes mellitus with diabetic polyneuropathy: Secondary | ICD-10-CM

## 2022-03-02 DIAGNOSIS — Z794 Long term (current) use of insulin: Secondary | ICD-10-CM

## 2022-03-02 NOTE — Progress Notes (Unsigned)
[facility] [address] [place] [address] [phone]       OFFICE FOLLOW UP NOTE  [name] Date of Birth:  Oct 27, 1940 Medical Record Number:  [id number]    Primary neurologist: Dr. Krista Blue Reason for visit: Memory loss, hx of prior strokes    SUBJECTIVE:   CHIEF COMPLAINT:  No chief complaint on file.   HPI:   Evan Stanley is a 81 year old male, seen in request by his primary care physician Dr. [name], [place] Jewels for evaluation of memory loss, gait abnormality, initial evaluation with Dr. Krista Blue on April 14, 2021   I reviewed and summarized the referring note.PMHX. HTN DM since 2010 HLD Atrial Fibrillation-on eliquis Stroke in Oct 2017, left side weakness, recovered well, was able to go back driving, golf Right endarterectomy January 13, 2016 Aortic valve replacement, Left eye, retinal artery occlusion in 2015, lost vision of left eye. Peripheral vascular disease. Smoker in the past,     Consult visit 04/14/2021 Dr. Krista Blue: He had a multiple stroke in the past, had multiple vascular risk factor, atrial fibrillation, diabetes, hypertension, hyperlipidemia, aortic valve replacement, has mild residual left-sided weakness, gait abnormality from previous stroke, already have some baseline gait abnormality  Wife noticed that patient has worsening gait abnormality memory loss since his COVID infection in August 2022, he was severely hydrated, high fever for few days, had bad congestion, achy all over, but does not require hospitalization  Since then, he was noted to have increased left-sided weakness, also developed this flailed movement of left lower extremity, sometimes involving left upper extremity, also increased confusion, memory loss,   He retired from maintenance at age 31, prior to the COVID infection in August 2022, he was still able to drive short distance to golf course, play golf, now wife is worried about leaving him alone at   house for few hours,   update Aug 11 2021 Dr. Krista Blue: he is accompanied by, wife at today's visit, since last visit in September 2022, he had made significant progress, home physical therapy has been very helpful  He continues to have significant memory loss, loss of vision of left eye due to previous left retinal artery occlusion, he likes to drive short distance to golf course, to check with his friends, but is no longer able to play, mild unsteady gait  Personally reviewed MRI of the brain in October 2022: Moderate generalized atrophy, scattered positive DWI lesion at the right frontal lobe, consistent with acute/subacute infarction, cerebral lacunar infarction in the cerebral hemisphere, pons, left cerebellar hemisphere, occlusion of left internal carotid artery, right vertebral artery, also was seen at previous MRI  He is taking Eliquis due to history of atrial fibrillation,    UPDATE 03/02/2022 JM: Patient returns for follow-up visit after prior visit with Dr. Krista Blue 7 months ago.  Unfortunately, he has had some decline since prior visit especially with mood disturbance and sundowning.  At times he can be physically aggressive or verbally abuse her.  He has poor balance which limits physical activity.  He no longer drives.  He is not currently on any medications for dementia nor medications for mood.   He routinely follows with endocrinology with recent A1c 6.6 as well is routine follow-up with cardiology and nephrology        ROS:   14 system review of systems performed and negative with exception of ***  PMH:  Past Medical History:  Diagnosis Date   AKI (acute kidney injury) ([place])  Allergy    Anxiety    Arthritis    Right shoulder   Atrial fibrillation (HCC)    Carotid artery stenosis    Cataract    CHF (congestive heart failure) (HCC)    CVA (cerebral vascular accident) ([place]) 04/28/2016   Decreased vision    left eye   Diabetes mellitus without complication ([place])     Takes Metformin   Hyperlipidemia    Hypertension    Salmonella bacteremia 04/29/2016   Sepsis ([place]) 04/29/2016   Stroke ([place]) 11/18/2015   no deficits   TIA (transient ischemic attack)    affected left eye   Urgency of urination     PSH:  Past Surgical History:  Procedure Laterality Date   AORTIC VALVE REPLACEMENT     BACK SURGERY  80s   CARDIAC VALVE REPLACEMENT  11/01/2008   21-mm Edwards Pericardial Magna - Ease valve   ENDARTERECTOMY Right 01/13/2016   Procedure: ENDARTERECTOMY CAROTID - RIGHT;  Surgeon: Conrad [place], MD;  Location: [facility];  Service: Vascular;  Laterality: Right;   EYE SURGERY     laser surgery, left eye  Left 10-29-2015    retinal surgery/ Deloria Lair MD    PERIPHERAL VASCULAR CATHETERIZATION Right 11/18/2015   Procedure: Carotid Angiography;  Surgeon: Conrad [place], MD;  Location: [facility];  Service: Cardiovascular;  Laterality: Right;   PERIPHERAL VASCULAR CATHETERIZATION N/A 11/18/2015   Procedure: Aortic Arch Angiography;  Surgeon: Conrad Vader, MD;  Location: [facility];  Service: Cardiovascular;  Laterality: N/A;   TEE WITHOUT CARDIOVERSION N/A 05/05/2016   Procedure: TRANSESOPHAGEAL ECHOCARDIOGRAM (TEE);  Surgeon: Lelon Perla, MD;  Location[facility];  Service: Cardiovascular;  Laterality: N/A;    Social History:  Social History   Socioeconomic History   Marital status: Married    Spouse name: [place]   Number of children: 2   Years of education: 11   Highest education level: 11th grade  Occupational History   Occupation: maintenance    Employer: [employer]    Comment: Retired  Tobacco Use   Smoking status: Former    Packs/day: 2.00    Years: 20.00    Total pack years: 40.00    Types: Cigarettes    Quit date: 07/20/1986    Years since quitting: 35.6   Smokeless tobacco: Never  Vaping Use   Vaping Use: Never used  Substance and Sexual Activity   Alcohol use: No    Alcohol/week: 0.0 standard drinks of alcohol   Drug use: No    Sexual activity: Yes  Other Topics Concern   Not on file  Social History Narrative   Lives with wife in home   Right Handed   Drinks 6-7 cups caffeine daily   Social Determinants of Health   Financial Resource Strain: Medium Risk (07/03/2021)   Overall Financial Resource Strain (CARDIA)    Difficulty of Paying Living Expenses: Somewhat hard  Food Insecurity: No Food Insecurity (07/03/2021)   Hunger Vital Sign    Worried About Running Out of Food in the Last Year: Never true    Ran Out of Food in the Last Year: Never true  Transportation Needs: No Transportation Needs (07/03/2021)   PRAPARE - Hydrologist (Medical): No    Lack of Transportation (Non-Medical): No  Physical Activity: Sufficiently Active (07/03/2021)   Exercise Vital Sign    Days of Exercise per Week: 5 days    Minutes of Exercise per  Session: 30 min  Stress: No Stress Concern Present (07/03/2021)   [place]    Feeling of Stress : Only a little  Social Connections: Moderately Integrated (07/03/2021)   Social Connection and Isolation Panel [NHANES]    Frequency of Communication with Friends and Family: More than three times a week    Frequency of Social Gatherings with Friends and Family: More than three times a week    Attends Religious Services: Never    Marine scientist or Organizations: Yes    Attends Music therapist: More than 4 times per year    Marital Status: Married  Human resources officer Violence: Not At Risk (07/03/2021)   Humiliation, Afraid, Rape, and Kick questionnaire    Fear of Current or Ex-Partner: No    Emotionally Abused: No    Physically Abused: No    Sexually Abused: No    Family History:  Family History  Problem Relation Age of Onset   Heart disease Mother    Cancer Mother        breast   Stroke Father 68   Hypertension Father    Early death Sister        infant death    Heart disease Brother    Hydrocephalus Brother    Heart disease Brother    Diabetes Brother    Cancer Brother        prostat   Healthy Son    Healthy Son     Medications:   Current Outpatient Medications on File Prior to Visit  Medication Sig Dispense Refill   acetaminophen (TYLENOL) 325 MG tablet Take 2 tablets (650 mg total) by mouth every 6 (six) hours as needed for mild pain (or Fever >/= 101).     atorvastatin (LIPITOR) 40 MG tablet TAKE 1 TABLET BY MOUTH DAILY 100 tablet 0   atropine 1 % ophthalmic solution Place 1 drop into the left eye 2 (two) times daily. 3 mL 11   cetirizine (ZYRTEC) 10 MG tablet Take 1 tablet by mouth as needed for allergies.     cyanocobalamin 1000 MCG tablet Take 1,000 mcg by mouth daily.     Dulaglutide (TRULICITY) 1.5 BH/4.1PF SOPN Inject 1.5 mg into the skin once a week. Inject the contents of one pen once per week 6 mL 5   ELIQUIS 5 MG TABS tablet TAKE 1 TABLET BY MOUTH  TWICE DAILY 180 tablet 0   hydrochlorothiazide (HYDRODIURIL) 25 MG tablet TAKE 1 TABLET BY MOUTH  DAILY 90 tablet 3   insulin aspart (NOVOLOG) 100 UNIT/ML injection Inject 9 Units into the skin in the morning and at bedtime. Pt takes 9 units before breakfast, and 8 units before supper.     insulin degludec (TRESIBA FLEXTOUCH) 200 UNIT/ML FlexTouch Pen Inject 52 Units into the skin daily.     latanoprost (XALATAN) 0.005 % ophthalmic solution Place 1 drop into the left eye at bedtime.      lisinopril (ZESTRIL) 20 MG tablet TAKE 1 TABLET BY MOUTH DAILY 100 tablet 0   metoprolol succinate (TOPROL-XL) 100 MG 24 hr tablet TAKE 1 TABLET BY MOUTH  DAILY WITH OR IMMEDIATELY  FOLLOWING A MEAL 90 tablet 3   ONETOUCH ULTRA test strip CHECK BLOOD SUGAR TWICE  DAILY Dx E11.42 200 strip 3   UNABLE TO FIND Diet: NAS and consistent carbohydrate     vitamin C (ASCORBIC ACID) 500 MG tablet Take 500 mg by mouth daily.  vitamin E 1000 UNIT capsule Take 1,000 Units by mouth daily.     No current  facility-administered medications on file prior to visit.    Allergies:   Allergies  Allergen Reactions   Baclofen Other (See Comments)    Stroke like symptoms   Morphine And Related Other (See Comments)    Delirium       OBJECTIVE:  Physical Exam  There were no vitals filed for this visit. There is no height or weight on file to calculate BMI. No results found.   General: well developed, well nourished, seated, in no evident distress Head: head normocephalic and atraumatic.   Neck: supple with no carotid or supraclavicular bruits Cardiovascular: regular rate and rhythm, no murmurs Musculoskeletal: no deformity Skin:  no rash/petichiae Vascular:  Normal pulses all extremities   Neurologic Exam Mental Status: Awake and fully alert. Oriented to place and time. Recent and remote memory intact. Attention span, concentration and fund of knowledge appropriate. Mood and affect appropriate.  Cranial Nerves: Pupils equal, briskly reactive to light. Extraocular movements full without nystagmus. Visual fields full to confrontation. Hearing intact. Facial sensation intact. Face, tongue, palate moves normally and symmetrically.  Motor: Normal bulk and tone. Normal strength in all tested extremity muscles Sensory.: intact to touch , pinprick , position and vibratory sensation.  Coordination: Rapid alternating movements normal in all extremities. Finger-to-nose and heel-to-shin performed accurately bilaterally. Gait and Station: Arises from chair without difficulty. Stance is normal. Gait demonstrates normal stride length and balance without use of AD. Tandem walk and heel toe without difficulty.  Reflexes: 1+ and symmetric. Toes downgoing.       10/29/2021    9:08 AM 11/10/2017    9:20 AM 11/04/2016    8:52 AM  MMSE - Mini Mental State Exam  Orientation to time 5 5 4   Orientation to Place 5 5 5   Registration 3 3 3   Attention/ Calculation 3 0 1  Attention/Calculation-comments  not  attempted   Recall 2 2 3   Language- name 2 objects 2 2 2   Language- repeat 1 1 1   Language- follow 3 step command 3 3 3   Language- read & follow direction 1 1 1   Write a sentence 1 1 1   Copy design 1 1 1   Total score 27 24 25         ASSESSMENT/PLAN: TORRIN CRIHFIELD is a 81 y.o. year old male   Dementia, likely mixed Alzheimer's and vascular with behavior disturbance  -Recommend trialing Namenda with gradual titration to 10 mg twice daily -discussed indication for medication to hopefully slow progression and some benefit in regards to behaviors  -if no benefit in regards to behaviors or unable to tolerate, may consider use of Seroquel  -MoCA ***/30 (prior 13/30)  -MMSE 27/30 10/2021                     Had long discussion with patient and his wife, I worry about his ability to drive, suggest him to stop driving,   Stroke Presented with Worsening gait abnormality Since COVID infection in August 2022             Multiple strokes in the past and multiple vascular risk factors including hypertension, hyperlipidemia, diabetes, atrial fibrillation, history of stroke, history of right internal carotid artery endarterectomy, chronic left ICA and VA occlusion, peripheral vascular disease, aortic valve replacement, previous smoker, aging,             MRI of the  brain 04/2021 showed acute to subacute stroke involving right frontal subcortical region, worsening moderate generalized atrophy, moderate supratentorium small vessel disease, multiple lacunar infarction, evidence of occlusion of left internal carotid artery and right vertebral artery that was present at previous MRI             Already on anticoagulation Eliquis 5 mg twice a day - continue per cardiology recommendations             Emphasized importance of increasing daily activity/exercise, compliant with his medications, increase water intake and routine follow-up with PCP/other specialties for stroke risk factor management           Follow up in *** or call earlier if needed   CC:  PCP: [name], DO    I spent *** minutes of face-to-face and non-face-to-face time with patient.  This included previsit chart review, lab review, study review, order entry, electronic health record documentation, patient education regarding ***   Frann Rider, AGNP-BC  [facility] [address] [place], [place] [address]  Phone [phone] Fax [phone] Note: This document was prepared with digital dictation and possible smart phrase technology. Any transcriptional errors that result from this process are unintentional.

## 2022-03-02 NOTE — Patient Instructions (Addendum)
Dr Krista Blue DID diagnose you with dementia in January.  I HIGHLY recommend that you follow up with her regarding treatment options.  Monitor your blood pressure at home.  If it continues to be low, I think we might need to discontinue the fluid pill.  I will reach out to Dr Theador Hawthorne  Dementia Dementia is a condition that affects the way the brain functions. It often affects memory and thinking. Usually, dementia gets worse with time and cannot be reversed (progressive dementia). There are many types of dementia, including: Alzheimer's disease. This type is the most common. Vascular dementia. This type may happen as the result of a stroke. Lewy body dementia. This type may happen to people who have Parkinson's disease. Frontotemporal dementia. This type is caused by damage to nerve cells (neurons) in certain parts of the brain. Some people may be affected by more than one type of dementia. This is called mixed dementia. What are the causes? Dementia is caused by damage to cells in the brain. The area of the brain and the types of cells damaged determine the type of dementia. Usually, this damage is irreversible or cannot be undone. Some examples of irreversible causes include: Conditions that affect the blood vessels of the brain, such as diabetes, heart disease, or blood vessel disease. Genetic mutations. In some cases, changes in the brain may be caused by another condition and can be reversed or slowed. Some examples of reversible causes include: Injury to the brain. Certain medicines. Infection, such as meningitis. Metabolic problems, such as vitamin B12 deficiency or thyroid disease. Pressure on the brain, such as from a tumor, blood clot, or too much fluid in the brain (hydrocephalus). Autoimmune diseases that affect the brain or arteries, such as limbic encephalitis or vasculitis. What are the signs or symptoms? Symptoms of dementia depend on the type of dementia. Common signs of dementia  include problems with remembering, thinking, problem solving, decision making, and communicating. These signs develop slowly or get worse with time. This may include: Problems remembering events or people. Having trouble taking a bath or putting clothes on. Forgetting appointments or forgetting to pay bills. Difficulty planning and preparing meals. Having trouble speaking. Getting lost easily. Changes in behavior or mood. How is this diagnosed? This condition is diagnosed by a specialist (neurologist). It is diagnosed based on the history of your symptoms, your medical history, a physical exam, and tests. Tests may include: Tests to evaluate brain function, such as memory tests, cognitive tests, and other tests. Lab tests, such as blood or urine tests. Imaging tests, such as a CT scan, a PET scan, or an MRI. Genetic testing. This may be done if other family members have a diagnosis of certain types of dementia. Your health care provider will talk with you and your family, friends, or caregivers about your history and symptoms. How is this treated? Treatment for this condition depends on the cause of the dementia. Progressive dementias, such as Alzheimer's disease, cannot be cured, but there may be treatments that help to manage symptoms. Treatment might involve taking medicines that may help to: Control the dementia. Slow down the progression of the dementia. Manage symptoms. In some cases, treating the cause of your dementia can improve symptoms, reverse symptoms, or slow down how quickly your dementia becomes worse. Your health care provider can direct you to support groups, organizations, and other health care providers who can help with decisions about your care. Follow these instructions at home: Medicines Take over-the-counter and prescription  medicines only as told by your health care provider. Use a pill organizer or pill reminder to help you manage your medicines. Avoid taking  medicines that can affect thinking, such as pain medicines or sleeping medicines. Lifestyle Make healthy lifestyle choices. Be physically active as told by your health care provider. Do not use any products that contain nicotine or tobacco, such as cigarettes, e-cigarettes, and chewing tobacco. If you need help quitting, ask your health care provider. Do not drink alcohol. Practice stress-management techniques when you get stressed. Spend time with other people. Make sure to get quality sleep. These tips can help you get a good night's rest: Avoid napping during the day. Keep your sleeping area dark and cool. Avoid exercising during the few hours before you go to bed. Avoid caffeine products in the evening. Eating and drinking Drink enough fluid to keep your urine pale yellow. Eat a healthy diet. General instructions  Work with your health care provider to determine what you need help with and what your safety needs are. Talk with your health care provider about whether it is safe for you to drive. If you were given a bracelet that identifies you as a person with memory loss or tracks your location, make sure to wear it at all times. Work with your family to make important decisions, such as advance directives, medical power of attorney, or a living will. Keep all follow-up visits. This is important. Where to find more information Alzheimer's Association: [employer]nz National Institute on Aging: DVDEnthusiasts.nl World Health Organization: RoleLink.com.br Contact a health care provider if: You have any new or worsening symptoms. You have problems with choking or swallowing. Get help right away if: You feel depressed or sad, or feel that you want to harm yourself. Your family members become concerned for your safety. If you ever feel like you may hurt yourself or others, or have thoughts about taking your own life, get help right away. Go to your nearest emergency department  or: Call your local emergency services (911 in the [place] Call a suicide crisis helpline, such as the [place] at 518-486-9531 or 988 in the [place]. This is open 24 hours a day in the [place] Text the Crisis Text Line at 443-711-5674 (in the [place].). Summary Dementia is a condition that affects the way the brain functions. Dementia often affects memory and thinking. Usually, dementia gets worse with time and cannot be reversed (progressive dementia). Treatment for this condition depends on the cause of the dementia. Work with your health care provider to determine what you need help with and what your safety needs are. Your health care provider can direct you to support groups, organizations, and other health care providers who can help with decisions about your care. This information is not intended to replace advice given to you by your health care provider. Make sure you discuss any questions you have with your health care provider. Document Revised: 01/29/2021 Document Reviewed: 11/20/2019 Elsevier Patient Education  [place].

## 2022-03-02 NOTE — Progress Notes (Signed)
Subjective: CC:CKD, DM, HTN, HLD PCP: [name], DO VPX:TGGYIR L Denn is a 81 y.o. male presenting to clinic today for:  1. Type 2 Diabetes with hypertension, hyperlipidemia and CKD 3B:  Patient sees Dr. Dwyane Dee, endocrinologist.  Recent A1c was a bit on the low side and due to risk of hypoglycemia his insulin was reduced.  He has followed up with nephrology as of May and renal function was consistent with CKD 3B.  SGLT 2 felt to be high risk for this patient as he has had recurrent UTIs in the past.  No hypoglycemic episodes reported today.  He is compliant with all medications  Last eye exam: Up-to-date Last foot exam: Needs Last A1c:  Lab Results  Component Value Date   HGBA1C 6.6 (A) 12/18/2021   Nephropathy screen indicated?:  Has CKD and is followed by nephrology.  On ACE inhibitor and instructed continue Last flu, zoster and/or pneumovax:  Immunization History  Administered Date(s) Administered   Fluad Quad(high Dose 65+) 05/23/2019, 04/25/2020   Influenza Split 04/27/2016   Influenza Whole 05/26/2010   Influenza, High Dose Seasonal PF 05/20/2017, 05/23/2018   Influenza,inj,Quad PF,6+ Mos 06/14/2013, 04/16/2014, 06/18/2015   Influenza-Unspecified 05/14/2016, 05/20/2017, 05/23/2018   Moderna Sars-Covid-2 Vaccination 08/16/2019, 09/13/2019, 07/04/2020   Pneumococcal Conjugate-13 06/18/2015   Pneumococcal Polysaccharide-23 12/16/2016   Tdap 03/05/2011    ROS: No chest pain or shortness of breath reported  2. Afib  No bleeding episodes reported  3.  Mood disturbance Patient is accompanied today by his wife.  She notes that he has been having worsening mood disturbance which seems to happen more often when the sun goes down.  She is not sure if he was actually diagnosed with dementia after his January appointment with neurology but she is very concerned that he may have this.  He is disgruntled because he can no longer drive and this was at the behest of his  neurologist as they in fact did find moderate dementia.  He is not currently treated for dementia.  He is not currently treated for mood.  His wife tells me today that sometimes he seems to be more aggressive and will grab at her and or verbally abuse her.  She is not sure how much longer she can deal with the home situation but she has no intent to leave him.  ROS: Per HPI  Allergies  Allergen Reactions   Baclofen Other (See Comments)    Stroke like symptoms   Morphine And Related Other (See Comments)    Delirium    Past Medical History:  Diagnosis Date   AKI (acute kidney injury) ([place])    Allergy    Anxiety    Arthritis    Right shoulder   Atrial fibrillation (HCC)    Carotid artery stenosis    Cataract    CHF (congestive heart failure) (HCC)    CVA (cerebral vascular accident) ([place]) 04/28/2016   Decreased vision    left eye   Diabetes mellitus without complication ([place])    Takes Metformin   Hyperlipidemia    Hypertension    Salmonella bacteremia 04/29/2016   Sepsis ([place]) 04/29/2016   Stroke ([place]) 11/18/2015   no deficits   TIA (transient ischemic attack)    affected left eye   Urgency of urination     Current Outpatient Medications:    acetaminophen (TYLENOL) 325 MG tablet, Take 2 tablets (650 mg total) by mouth every 6 (six) hours as needed for mild pain (or  Fever >/= 101)., Disp: , Rfl:    atorvastatin (LIPITOR) 40 MG tablet, TAKE 1 TABLET BY MOUTH DAILY, Disp: 100 tablet, Rfl: 0   atropine 1 % ophthalmic solution, Place 1 drop into the left eye 2 (two) times daily., Disp: 3 mL, Rfl: 11   cetirizine (ZYRTEC) 10 MG tablet, Take 1 tablet by mouth as needed for allergies., Disp: , Rfl:    cyanocobalamin 1000 MCG tablet, Take 1,000 mcg by mouth daily., Disp: , Rfl:    Dulaglutide (TRULICITY) 1.5 GN/5.6OZ SOPN, Inject 1.5 mg into the skin once a week. Inject the contents of one pen once per week, Disp: 6 mL, Rfl: 5   ELIQUIS 5 MG TABS tablet, TAKE 1 TABLET BY MOUTH  TWICE  DAILY, Disp: 180 tablet, Rfl: 0   hydrochlorothiazide (HYDRODIURIL) 25 MG tablet, TAKE 1 TABLET BY MOUTH  DAILY, Disp: 90 tablet, Rfl: 3   insulin aspart (NOVOLOG) 100 UNIT/ML injection, Inject 9 Units into the skin in the morning and at bedtime. Pt takes 9 units before breakfast, and 8 units before supper., Disp: , Rfl:    insulin degludec (TRESIBA FLEXTOUCH) 200 UNIT/ML FlexTouch Pen, Inject 52 Units into the skin daily., Disp: , Rfl:    latanoprost (XALATAN) 0.005 % ophthalmic solution, Place 1 drop into the left eye at bedtime. , Disp: , Rfl:    lisinopril (ZESTRIL) 20 MG tablet, TAKE 1 TABLET BY MOUTH DAILY, Disp: 100 tablet, Rfl: 0   metoprolol succinate (TOPROL-XL) 100 MG 24 hr tablet, TAKE 1 TABLET BY MOUTH  DAILY WITH OR IMMEDIATELY  FOLLOWING A MEAL, Disp: 90 tablet, Rfl: 3   ONETOUCH ULTRA test strip, CHECK BLOOD SUGAR TWICE  DAILY Dx E11.42, Disp: 200 strip, Rfl: 3   UNABLE TO FIND, Diet: NAS and consistent carbohydrate, Disp: , Rfl:    vitamin C (ASCORBIC ACID) 500 MG tablet, Take 500 mg by mouth daily., Disp: , Rfl:    vitamin E 1000 UNIT capsule, Take 1,000 Units by mouth daily., Disp: , Rfl:  Social History   Socioeconomic History   Marital status: Married    Spouse name: Brilliant   Number of children: 2   Years of education: 11   Highest education level: 11th grade  Occupational History   Occupation: maintenance    Employer: [employer]    Comment: Retired  Tobacco Use   Smoking status: Former    Packs/day: 2.00    Years: 20.00    Total pack years: 40.00    Types: Cigarettes    Quit date: 07/20/1986    Years since quitting: 35.6   Smokeless tobacco: Never  Vaping Use   Vaping Use: Never used  Substance and Sexual Activity   Alcohol use: No    Alcohol/week: 0.0 standard drinks of alcohol   Drug use: No   Sexual activity: Yes  Other Topics Concern   Not on file  Social History Narrative   Lives with wife in home   Right Handed   Drinks 6-7 cups caffeine daily   Social  Determinants of Health   Financial Resource Strain: Medium Risk (07/03/2021)   Overall Financial Resource Strain (CARDIA)    Difficulty of Paying Living Expenses: Somewhat hard  Food Insecurity: No Food Insecurity (07/03/2021)   Hunger Vital Sign    Worried About Running Out of Food in the Last Year: Never true    Ran Out of Food in the Last Year: Never true  Transportation Needs: No Transportation Needs (07/03/2021)   PRAPARE -  Hydrologist (Medical): No    Lack of Transportation (Non-Medical): No  Physical Activity: Sufficiently Active (07/03/2021)   Exercise Vital Sign    Days of Exercise per Week: 5 days    Minutes of Exercise per Session: 30 min  Stress: No Stress Concern Present (07/03/2021)   [place]    Feeling of Stress : Only a little  Social Connections: Moderately Integrated (07/03/2021)   Social Connection and Isolation Panel [NHANES]    Frequency of Communication with Friends and Family: More than three times a week    Frequency of Social Gatherings with Friends and Family: More than three times a week    Attends Religious Services: Never    Marine scientist or Organizations: Yes    Attends Music therapist: More than 4 times per year    Marital Status: Married  Human resources officer Violence: Not At Risk (07/03/2021)   Humiliation, Afraid, Rape, and Kick questionnaire    Fear of Current or Ex-Partner: No    Emotionally Abused: No    Physically Abused: No    Sexually Abused: No   Family History  Problem Relation Age of Onset   Heart disease Mother    Cancer Mother        breast   Stroke Father 64   Hypertension Father    Early death Sister        infant death   Heart disease Brother    Hydrocephalus Brother    Heart disease Brother    Diabetes Brother    Cancer Brother        prostat   Healthy Son    Healthy Son     Objective: Office vital  signs reviewed. BP 105/62   Pulse 63   Temp 97.8 F (36.6 C)   Ht 5' 5"  (1.651 m)   Wt 165 lb 9.6 oz (75.1 kg)   SpO2 97%   BMI 27.56 kg/m   Physical Examination:  General: Awake, alert, nontoxic elderly male, No acute distress HEENT: Drooping of the left side of the face with some drooling Psych: Agitated.  Does not appear to be responding to internal stimuli  Assessment/ Plan: 81 y.o. male   Moderate vascular dementia with agitation ([place])  Diabetic peripheral neuropathy associated with type 2 diabetes mellitus ([place])  Type 2 diabetes mellitus with stage 3b chronic kidney disease, with long-term current use of insulin ([place]) - Plan: Renal Function Panel, Protein / Creatinine Ratio, Urine, CBC, CANCELED: Renal Function Panel  Hypertension associated with diabetes ([place])  Hyperlipidemia associated with type 2 diabetes mellitus ([place]), goal LDL <70  Paroxysmal A-fib ([place]) - Plan: Magnesium, TSH  Much of our appointment was spent with regards to what sounds like progression of dementia, which I suspect to be vascular given history of CVA.  He is got some agitation and I wonder if he would not benefit from some type of mood stabilizer.  Given complex medical history including A-fib and CKD I will CC his neurologist as FYI.  I have highly encouraged him to schedule an appointment for follow-up with her.  Renal labs have been ordered  Blood pressure borderline low.  We will CC his nephrologist as FYI as I wonder if he may not benefit from reduction in his HCTZ and or discontinuation of this.  Wonder if patient would be a good candidate for [place]?  Continue to follow-up with endocrinology as scheduled  for management of diabetes  No orders of the defined types were placed in this encounter.  No orders of the defined types were placed in this encounter.    [name], DO [place] [phone]

## 2022-03-03 LAB — RENAL FUNCTION PANEL
Albumin: 4 g/dL (ref 3.8–4.8)
BUN/Creatinine Ratio: 14 (ref 10–24)
BUN: 24 mg/dL (ref 8–27)
CO2: 26 mmol/L (ref 20–29)
Calcium: 9.6 mg/dL (ref 8.6–10.2)
Chloride: 102 mmol/L (ref 96–106)
Creatinine, Ser: 1.71 mg/dL — ABNORMAL HIGH (ref 0.76–1.27)
Glucose: 219 mg/dL — ABNORMAL HIGH (ref 70–99)
Phosphorus: 3.8 mg/dL (ref 2.8–4.1)
Potassium: 4.3 mmol/L (ref 3.5–5.2)
Sodium: 140 mmol/L (ref 134–144)
eGFR: 40 mL/min/{1.73_m2} — ABNORMAL LOW (ref >59)

## 2022-03-03 LAB — TSH: TSH: 1.43 u[IU]/mL (ref 0.450–4.500)

## 2022-03-03 LAB — PROTEIN / CREATININE RATIO, URINE
Creatinine, Urine: 257.2 mg/dL
Protein, Ur: 71.9 mg/dL
Protein/Creat Ratio: 280 mg/g creat — ABNORMAL HIGH (ref 0–200)

## 2022-03-03 LAB — CBC
Hematocrit: 45.9 % (ref 37.5–51.0)
Hemoglobin: 15.1 g/dL (ref 13.0–17.7)
MCH: 28.4 pg (ref 26.6–33.0)
MCHC: 32.9 g/dL (ref 31.5–35.7)
MCV: 86 fL (ref 79–97)
Platelets: 143 10*3/uL — ABNORMAL LOW (ref 150–450)
RBC: 5.31 x10E6/uL (ref 4.14–5.80)
RDW: 13.5 % (ref 11.6–15.4)
WBC: 9 10*3/uL (ref 3.4–10.8)

## 2022-03-03 LAB — MAGNESIUM: Magnesium: 1.9 mg/dL (ref 1.6–2.3)

## 2022-03-04 ENCOUNTER — Encounter: Payer: Self-pay | Admitting: Adult Health

## 2022-03-04 ENCOUNTER — Ambulatory Visit: Payer: Medicare Other | Admitting: Adult Health

## 2022-03-04 VITALS — BP 111/47 | HR 64 | Ht 65.0 in | Wt 167.5 lb

## 2022-03-04 DIAGNOSIS — F01518 Vascular dementia, unspecified severity, with other behavioral disturbance: Secondary | ICD-10-CM

## 2022-03-04 DIAGNOSIS — G309 Alzheimer's disease, unspecified: Secondary | ICD-10-CM

## 2022-03-04 DIAGNOSIS — F02818 Dementia in other diseases classified elsewhere, unspecified severity, with other behavioral disturbance: Secondary | ICD-10-CM | POA: Diagnosis not present

## 2022-03-04 DIAGNOSIS — Z8673 Personal history of transient ischemic attack (TIA), and cerebral infarction without residual deficits: Secondary | ICD-10-CM

## 2022-03-04 MED ORDER — MEMANTINE HCL 28 X 5 MG & 21 X 10 MG PO TABS: ORAL_TABLET | ORAL | 0 refills | Status: DC

## 2022-03-04 MED ORDER — MEMANTINE HCL 10 MG PO TABS: 10.0000 mg | ORAL_TABLET | Freq: Two times a day (BID) | ORAL | 5 refills | Status: DC

## 2022-03-04 NOTE — Patient Instructions (Addendum)
Your Plan:  Start Namenda with gradual titration - start with 5 mg daily for 7 days  - increase to 27m twice daily for 7 days - increase to 538mAM and 1057mM for 7 days - increase to 9m33mice daily   If behaviors persist after that time, we can try additional medications or can discuss referral with PCP to see psychiatry   It is highly recommend that he no longer drives due to likely moderate to severe dementia  If he is persistent with driving, can consider doing a driving evaluation. If he refuses evaluation but continues to drive, we can submit paperwork to DM[facility]   Please do not hesitate to call 911 with any concerns that patient may be a threat to himself or others     Follow up in 6 months or call earlier if needed     Thank you for coming to [facility]. I hope we have been able to provide you high quality care today.  You may receive a patient satisfaction survey over the next few weeks. We would appreciate your feedback and comments so that we may continue to improve ourselves and the health of our patients.     Dementia Caregiver Guide Dementia is a term used to describe a number of symptoms that affect memory and thinking. The most common symptoms include: Memory loss. Trouble with language and communication. Trouble concentrating. Poor judgment and problems with reasoning. Wandering from home or public places. Extreme anxiety or depression. Being suspicious or having angry outbursts and accusations. Child-like behavior and language. Dementia can be frightening and confusing. And taking care of someone with dementia can be challenging. This guide provides tips to help you when providing care for a person with dementia. How to help manage lifestyle changes Dementia usually gets worse slowly over time. In the early stages, people with dementia can stay independent and safe with some help. In later stages, they  need help with daily tasks such as dressing, grooming, and using the bathroom. There are actions you can take to help a person manage his or her life while living with this condition. Communicating When the person is talking or seems frustrated, make eye contact and hold the person's hand. Ask specific questions that need yes or no answers. Use simple words, short sentences, and a calm voice. Only give one direction at a time. When offering choices, limit the person to just one or two. Avoid correcting the person in a negative way. If the person is struggling to find the right words, gently try to help him or her. Preventing injury  Keep floors clear of clutter. Remove rugs, magazine racks, and floor lamps. Keep hallways well lit, especially at night. Put a handrail and nonslip mat in the bathtub or shower. Put childproof locks on cabinets that contain dangerous items, such as medicines, alcohol, guns, toxic cleaning items, sharp tools or utensils, matches, and lighters. For doors to the outside of the house, put the locks in places where the person cannot see or reach them easily. This will help ensure that the person does not wander out of the house and get lost. Be prepared for emergencies. Keep a list of emergency phone numbers and addresses in a convenient area. Remove car keys and lock garage doors so that the person does not try to get in the car and drive. Have the person wear a bracelet that tracks locations and identifies the person  as having memory problems. This should be worn at all times for safety. Helping with daily life  Keep the person on track with his or her routine. Try to identify areas where the person may need help. Be supportive, patient, calm, and encouraging. Gently remind the person that adjusting to changes takes time. Help with the tasks that the person has asked for help with. Keep the person involved in daily tasks and decisions as much as possible. Encourage  conversation, but try not to get frustrated if the person struggles to find words or does not seem to appreciate your help. How to recognize stress Look for signs of stress in yourself and in the person you are caring for. If you notice signs of stress, take steps to manage it. Symptoms of stress include: Feeling anxious, irritable, frustrated, or angry. Denying that the person has dementia or that his or her symptoms will not improve. Feeling depressed, hopeless, or unappreciated. Difficulty sleeping. Difficulty concentrating. Developing stress-related health problems. Feeling like you have too little time for your own life. Follow these instructions at home: Take care of your health Make sure that you and the person you are caring for: Get regular sleep. Exercise regularly. Eat regular, nutritious meals. Take over-the-counter and prescription medicines only as told by your health care providers. Drink enough fluid to keep your urine pale yellow. Attend all scheduled health care appointments.  General instructions Join a support group with others who are caregivers. Ask about respite care resources. Respite care can provide short-term care for the person so that you can have a regular break from the stress of caregiving. Consider any safety risks and take steps to avoid them. Organize medicines in a pill box for each day of the week. Create a plan to handle any legal or financial matters. Get legal or financial advice if needed. Keep a calendar in a central location to remind the person of appointments or other activities. Where to find support: Many individuals and organizations offer support. These include: Support groups for people with dementia. Support groups for caregivers. Counselors or therapists. Home health care services. Adult day care centers. Where to find more information Centers for Disease Control and Prevention: http://www.wolf.info/ Alzheimer's Association:  [place] Family Caregiver Alliance: www.caregiver.[place]: www.alzfdn.org Contact a health care provider if: The person's health is rapidly getting worse. You are no longer able to care for the person. Caring for the person is affecting your physical and emotional health. You are feeling depressed or anxious about caring for the person. Get help right away if: The person threatens himself or herself, you, or anyone else. You feel depressed or sad, or feel that you want to harm yourself. If you ever feel like your loved one may hurt himself or herself or others, or if he or she shares thoughts about taking his or her own life, get help right away. You can go to your nearest emergency department or: Call your local emergency services (911 in the [place] Call a suicide crisis helpline, such as the [place] at 352-276-3937 or 988 in the [place]. This is open 24 hours a day in the [place] Text the Crisis Text Line at 432 206 3975 (in the [place].). Summary Dementia is a term used to describe a number of symptoms that affect memory and thinking. Dementia usually gets worse slowly over time. Take steps to reduce the person's risk of injury and to plan for future care. Caregivers need support, relief from caregiving,  and time for their own lives. This information is not intended to replace advice given to you by your health care provider. Make sure you discuss any questions you have with your health care provider. Document Revised: 01/29/2021 Document Reviewed: 11/20/2019 Elsevier Patient Education  [place].

## 2022-03-10 ENCOUNTER — Telehealth: Payer: Self-pay | Admitting: Pharmacist

## 2022-03-10 NOTE — Telephone Encounter (Signed)
Please let patient know:  #1 box of 5 pens (Novolog insulin) is ready in fridge for pick up  Thank you!

## 2022-03-10 NOTE — Telephone Encounter (Signed)
Spoke with wife. They will come by later today to pick up samples.  Also made aware that [facility] sent in refills today of Tresiba.

## 2022-03-11 ENCOUNTER — Other Ambulatory Visit: Payer: Self-pay | Admitting: Family Medicine

## 2022-03-12 DIAGNOSIS — I5032 Chronic diastolic (congestive) heart failure: Secondary | ICD-10-CM | POA: Diagnosis not present

## 2022-03-12 DIAGNOSIS — N189 Chronic kidney disease, unspecified: Secondary | ICD-10-CM | POA: Diagnosis not present

## 2022-03-12 DIAGNOSIS — R809 Proteinuria, unspecified: Secondary | ICD-10-CM | POA: Diagnosis not present

## 2022-03-12 DIAGNOSIS — E1122 Type 2 diabetes mellitus with diabetic chronic kidney disease: Secondary | ICD-10-CM | POA: Diagnosis not present

## 2022-03-12 DIAGNOSIS — E1129 Type 2 diabetes mellitus with other diabetic kidney complication: Secondary | ICD-10-CM | POA: Diagnosis not present

## 2022-03-12 DIAGNOSIS — D696 Thrombocytopenia, unspecified: Secondary | ICD-10-CM | POA: Diagnosis not present

## 2022-03-12 DIAGNOSIS — I129 Hypertensive chronic kidney disease with stage 1 through stage 4 chronic kidney disease, or unspecified chronic kidney disease: Secondary | ICD-10-CM | POA: Diagnosis not present

## 2022-03-12 DIAGNOSIS — E870 Hyperosmolality and hypernatremia: Secondary | ICD-10-CM | POA: Diagnosis not present

## 2022-03-19 DIAGNOSIS — M79676 Pain in unspecified toe(s): Secondary | ICD-10-CM | POA: Diagnosis not present

## 2022-03-19 DIAGNOSIS — B351 Tinea unguium: Secondary | ICD-10-CM | POA: Diagnosis not present

## 2022-03-19 DIAGNOSIS — L84 Corns and callosities: Secondary | ICD-10-CM | POA: Diagnosis not present

## 2022-03-19 DIAGNOSIS — E1142 Type 2 diabetes mellitus with diabetic polyneuropathy: Secondary | ICD-10-CM | POA: Diagnosis not present

## 2022-03-24 ENCOUNTER — Other Ambulatory Visit: Payer: Self-pay | Admitting: Adult Health

## 2022-03-24 DIAGNOSIS — F01518 Vascular dementia, unspecified severity, with other behavioral disturbance: Secondary | ICD-10-CM

## 2022-03-26 ENCOUNTER — Telehealth: Payer: Self-pay | Admitting: Family Medicine

## 2022-03-27 NOTE — Telephone Encounter (Signed)
Ahh I will fax them! Thank you! If he needs samples---I can leave up front

## 2022-03-30 NOTE — Telephone Encounter (Signed)
Left message requesting call back regarding novo nordisk med refills/samples.   Call back number [phone]

## 2022-03-31 ENCOUNTER — Telehealth: Payer: Self-pay | Admitting: Family Medicine

## 2022-04-01 ENCOUNTER — Ambulatory Visit: Payer: Medicare Other | Admitting: Endocrinology

## 2022-04-01 ENCOUNTER — Encounter: Payer: Self-pay | Admitting: Endocrinology

## 2022-04-01 VITALS — BP 128/60 | HR 60 | Ht 65.0 in | Wt 166.8 lb

## 2022-04-01 DIAGNOSIS — E1165 Type 2 diabetes mellitus with hyperglycemia: Secondary | ICD-10-CM | POA: Diagnosis not present

## 2022-04-01 DIAGNOSIS — Z794 Long term (current) use of insulin: Secondary | ICD-10-CM

## 2022-04-01 LAB — POCT GLYCOSYLATED HEMOGLOBIN (HGB A1C): Hemoglobin A1C: 6.2 % — AB (ref 4.0–5.6)

## 2022-04-01 NOTE — Patient Instructions (Addendum)
Tresiba 44 units  Novolog 6 units except 8 for eating out

## 2022-04-01 NOTE — Progress Notes (Signed)
Patient ID: Evan Stanley, male   DOB: 09/23/1940, 81 y.o.   MRN: [id number]          Reason for Appointment: Follow-up for Type 2 Diabetes    History of Present Illness:          Date of diagnosis of type 2 diabetes mellitus: 2010       Background history:   He thinks he was diagnosed to have diabetes after his coronary bypass surgery and blood sugars are not very high He had been on metformin until about 05/2017 Also at some point had taken glimepiride Usually appears to have had A1c just above 7%  Recent history:   INSULIN regimen is: [place] 48 units daily, Novolog 6 units at breakfast and 8 units at dinner  Non-insulin hypoglycemic drugs the patient is taking are: Trulicity 1.5 mg weekly  His A1c is lower at 6.2, previously was down to 6.6  Current management, blood sugar patterns and problems identified: His weight has gone down and he apparently is eating smaller portions Forgot to bring his monitor today Even with reducing his [name] on the last visit reportedly his morning sugars are mostly in the normal range or below 100 However no hypoglycemia lately Also reportedly his blood sugars are usually not above 120 The only high sugars are when he goes out to eat and does not take his insulin with him  Periodically will be eating fast food and not watching any high fat items At breakfast she will usually eat waffles and sausage NovoLog was supposed to be increased in the morning but he only takes a higher dose of 9 units when he is eating pancakes or waffles Continues on Trulicity also        Side effects from medications have been: None  Glucose monitoring:  done about 2 times a day         Glucometer: One Touch ultra 2.       Blood Glucose readings by review of home monitor download show   PRE-MEAL Fasting Lunch Dinner Bedtime Overall  Glucose range: 88-120      Mean/median:        POST-MEAL PC Breakfast PC Lunch PC Dinner  Glucose range:   100-120   Mean/median:      Previously   PRE-MEAL Fasting Lunch Dinner Bedtime Overall  Glucose range: 70-130      Mean/median: 97    105   POST-MEAL PC Breakfast PC Lunch PC Dinner  Glucose range:   58-201   Mean/median:   112     Self-care: The diet that the patient has been following is: tries to limit sweets.     Meal times are:  Breakfast is at after 9 AM, dinner around 6 PM  Typical meal intake: Breakfast is eggs and meat and toast, sometimes waffles            Dietician visit, most recent: 2019                Weight history:  Wt Readings from Last 3 Encounters:  04/01/22 166 lb 12.8 oz (75.7 kg)  03/04/22 167 lb 8 oz (76 kg)  03/02/22 165 lb 9.6 oz (75.1 kg)    Glycemic control:  Lab Results  Component Value Date   HGBA1C 6.2 (A) 04/01/2022   HGBA1C 6.6 (A) 12/18/2021   HGBA1C 7.2 (A) 09/17/2021   Lab Results  Component Value Date   MICROALBUR 14.4 (H) 08/12/2020   LDLCALC 54 07/02/2021  CREATININE 1.71 (H) 03/02/2022   Lab Results  Component Value Date   MICRALBCREAT 13.5 08/12/2020    Lab Results  Component Value Date   FRUCTOSAMINE 274 08/12/2020   FRUCTOSAMINE 323 (H) 04/22/2018   Lab Results  Component Value Date   HGB 15.1 03/02/2022      Allergies as of 04/01/2022       Reactions   Baclofen Other (See Comments)   Stroke like symptoms   Morphine And Related Other (See Comments)   Delirium         Medication List        Accurate as of April 01, 2022 11:59 PM. If you have any questions, ask your nurse or doctor.          acetaminophen 325 MG tablet Commonly known as: TYLENOL Take 2 tablets (650 mg total) by mouth every 6 (six) hours as needed for mild pain (or Fever >/= 101).   ascorbic acid 500 MG tablet Commonly known as: VITAMIN C Take 500 mg by mouth daily.   atorvastatin 40 MG tablet Commonly known as: LIPITOR TAKE 1 TABLET BY MOUTH DAILY   atropine 1 % ophthalmic solution Place 1 drop into the left eye 2  (two) times daily.   cetirizine 10 MG tablet Commonly known as: ZYRTEC Take 1 tablet by mouth as needed for allergies.   cholecalciferol 25 MCG (1000 UNIT) tablet Commonly known as: VITAMIN D3 Take 1,000 Units by mouth daily.   cyanocobalamin 1000 MCG tablet Take 1,000 mcg by mouth daily.   Eliquis 5 MG Tabs tablet Generic drug: apixaban TAKE 1 TABLET BY MOUTH  TWICE DAILY   hydrochlorothiazide 25 MG tablet Commonly known as: HYDRODIURIL TAKE 1 TABLET BY MOUTH  DAILY   insulin aspart 100 UNIT/ML injection Commonly known as: novoLOG Inject 9 Units into the skin in the morning and at bedtime. Pt takes 9 units before breakfast, and 8 units before supper.   latanoprost 0.005 % ophthalmic solution Commonly known as: XALATAN Place 1 drop into the left eye at bedtime.   lisinopril 20 MG tablet Commonly known as: ZESTRIL TAKE 1 TABLET BY MOUTH DAILY   memantine tablet pack Commonly known as: NAMENDA TITRATION PACK 5 MG/DAY FOR =1 WEEK 5 MG TWICE DAILY FOR =1 WEEK 15 MG/DAY GIVEN IN 5 MG AND 10 MG SEPARATED DOSES FOR =1 WEEK THEN 10 MG TWICE DAILY   memantine 10 MG tablet Commonly known as: Namenda Take 1 tablet (10 mg total) by mouth 2 (two) times daily. Start taking on: April 04, 2022   metoprolol succinate 100 MG 24 hr tablet Commonly known as: TOPROL-XL TAKE 1 TABLET BY MOUTH  DAILY WITH OR IMMEDIATELY  FOLLOWING A MEAL   OneTouch Ultra test strip Generic drug: glucose blood CHECK BLOOD SUGAR TWICE  DAILY Dx E11.42   [name] FlexTouch 200 UNIT/ML FlexTouch Pen Generic drug: insulin degludec Inject 52 Units into the skin daily.   Trulicity 1.5 MB/8.6LJ Sopn Generic drug: Dulaglutide Inject 1.5 mg into the skin once a week. Inject the contents of one pen once per week   UNABLE TO FIND Diet: NAS and consistent carbohydrate   vitamin E 1000 UNIT capsule Take 1,000 Units by mouth daily.        Allergies:  Allergies  Allergen Reactions   Baclofen Other  (See Comments)    Stroke like symptoms   Morphine And Related Other (See Comments)    Delirium     Past Medical History:  Diagnosis  Date   AKI (acute kidney injury) [facility])    Allergy    Anxiety    Arthritis    Right shoulder   Atrial fibrillation (HCC)    Carotid artery stenosis    Cataract    CHF (congestive heart failure) (HCC)    CVA (cerebral vascular accident) ([place]) 04/28/2016   Decreased vision    left eye   Diabetes mellitus without complication ([place])    Takes Metformin   Hyperlipidemia    Hypertension    Salmonella bacteremia 04/29/2016   Sepsis ([place]) 04/29/2016   Stroke ([place]) 11/18/2015   no deficits   TIA (transient ischemic attack)    affected left eye   Urgency of urination     Past Surgical History:  Procedure Laterality Date   AORTIC VALVE REPLACEMENT     BACK SURGERY  80s   CARDIAC VALVE REPLACEMENT  11/01/2008   21-mm Edwards Pericardial Magna - Ease valve   ENDARTERECTOMY Right 01/13/2016   Procedure: ENDARTERECTOMY CAROTID - RIGHT;  Surgeon: Conrad Vega, MD;  Location: [facility];  Service: Vascular;  Laterality: Right;   EYE SURGERY     laser surgery, left eye  Left 10-29-2015    retinal surgery/ Deloria Lair MD    PERIPHERAL VASCULAR CATHETERIZATION Right 11/18/2015   Procedure: Carotid Angiography;  Surgeon: Conrad Cedar, MD;  Location: [place] CV LAB;  Service: Cardiovascular;  Laterality: Right;   PERIPHERAL VASCULAR CATHETERIZATION N/A 11/18/2015   Procedure: Aortic Arch Angiography;  Surgeon: Conrad Westport, MD;  Location: [place] CV LAB;  Service: Cardiovascular;  Laterality: N/A;   TEE WITHOUT CARDIOVERSION N/A 05/05/2016   Procedure: TRANSESOPHAGEAL ECHOCARDIOGRAM (TEE);  Surgeon: Lelon Perla, MD;  Location: [facility] ENDOSCOPY;  Service: Cardiovascular;  Laterality: N/A;    Family History  Problem Relation Age of Onset   Heart disease Mother    Cancer Mother        breast   Stroke Father 27   Hypertension Father    Early death Sister         infant death   Heart disease Brother    Hydrocephalus Brother    Heart disease Brother    Diabetes Brother    Cancer Brother        prostat   Healthy Son    Healthy Son     Social History:  reports that he quit smoking about 35 years ago. His smoking use included cigarettes. He has a 40.00 pack-year smoking history. He has never used smokeless tobacco. He reports that he does not drink alcohol and does not use drugs.   Review of Systems   Lipid history: Lipids controlled well on atorvastatin 40 mg daily prescribed by his PCP   Has history of CAD and CVD    Lab Results  Component Value Date   CHOL 116 07/02/2021   HDL 37 (L) 07/02/2021   LDLCALC 54 07/02/2021   LDLDIRECT 62 02/16/2017   TRIG 141 07/02/2021   CHOLHDL 3.1 07/02/2021           Hypertension:  His treatment includes hydrochlorothiazide 25 mg and lisinopril 20 mg daily,   prescribed by PCP He does monitor BP at home   BP Readings from Last 3 Encounters:  04/01/22 128/60  03/04/22 (!) 111/47  03/02/22 105/62     CKD: Creatinine variable as follows Regularly seen by nephrologist Recent creatinine was 1.73  Lab Results  Component Value Date   CREATININE 1.71 (H) 03/02/2022  CREATININE 1.91 (H) 04/04/2021   CREATININE 1.90 (H) 03/18/2021          Physical Examination:  BP 128/60   Pulse 60   Ht 5' 5"  (1.651 m)   Wt 166 lb 12.8 oz (75.7 kg)   SpO2 94%   BMI 27.76 kg/m   Diabetic Foot Exam - Simple   No data filed        ASSESSMENT:  Diabetes type 2, on insulin   See history of present illness for detailed discussion of current diabetes management, blood sugar patterns and problems identified  His A1c is at the lowest level of 6.2   He is on basal bolus insulin regimen and Trulicity 1.5 mg weekly  He is cutting back on portions and losing a little weight This may be from some effect of dementia Blood sugars were not evaluated from home monitor download but they are fairly  close to normal usually No hypoglycemia Again not checking readings after breakfast usually and mostly after dinner and before breakfast  HYPERTENSION: Well-controlled    Renal insufficiency: Stable, he does follow-up with nephrologist   PLAN:   He will reduce Tresiba down to 44 to avoid overnight hypoglycemia Also reduce NovoLog at suppertime to 6 units unless eating fast food Discussed importance of monitoring blood sugars more closely with the CGM but he refuses to consider this now Encouraged him to check blood sugars periodically after breakfast also   Patient Instructions  Tresiba 44 units  Novolog 6 units except 8 for eating out      [name] 04/02/2022, 8:43 AM   Note: This office note was prepared with Dragon voice recognition system technology. Any transcriptional errors that result from this process are unintentional.

## 2022-04-03 NOTE — Telephone Encounter (Signed)
Yes, please let patient know tresiba samples labeled in fridge for pick up (wendy's office)  I have sent his refills to novo nordisk patient assistance several times He can call [phone] to check on the status

## 2022-04-06 ENCOUNTER — Telehealth: Payer: Self-pay | Admitting: Adult Health

## 2022-04-06 NOTE — Telephone Encounter (Signed)
Pt's wife called wanting to know what is the next step after the pt has finished the starter pack of the memantine (NAMENDA) 10 MG tablet  Wife states that they are on the second pill on the fourth week. Please advise.

## 2022-04-06 NOTE — Telephone Encounter (Signed)
Contacted pt wife back, informed her Rx 25m BID was also sent for pt to continue once he has completed starter pack. She verbally understood and was appreciative

## 2022-04-17 ENCOUNTER — Telehealth: Payer: Self-pay | Admitting: Pharmacist

## 2022-04-17 NOTE — Telephone Encounter (Signed)
Wife notified Tresiba u200 #3 boxes ready for pick up

## 2022-04-19 ENCOUNTER — Other Ambulatory Visit: Payer: Self-pay | Admitting: Family Medicine

## 2022-05-02 ENCOUNTER — Other Ambulatory Visit: Payer: Self-pay | Admitting: Adult Health

## 2022-05-02 DIAGNOSIS — G309 Alzheimer's disease, unspecified: Secondary | ICD-10-CM

## 2022-05-04 ENCOUNTER — Ambulatory Visit (INDEPENDENT_AMBULATORY_CARE_PROVIDER_SITE_OTHER): Payer: Medicare Other | Admitting: Family Medicine

## 2022-05-04 ENCOUNTER — Encounter: Payer: Self-pay | Admitting: Family Medicine

## 2022-05-04 DIAGNOSIS — B9689 Other specified bacterial agents as the cause of diseases classified elsewhere: Secondary | ICD-10-CM

## 2022-05-04 DIAGNOSIS — E1169 Type 2 diabetes mellitus with other specified complication: Secondary | ICD-10-CM | POA: Diagnosis not present

## 2022-05-04 DIAGNOSIS — J208 Acute bronchitis due to other specified organisms: Secondary | ICD-10-CM

## 2022-05-04 DIAGNOSIS — E785 Hyperlipidemia, unspecified: Secondary | ICD-10-CM

## 2022-05-04 MED ORDER — ATORVASTATIN CALCIUM 40 MG PO TABS: 40.0000 mg | ORAL_TABLET | Freq: Every day | ORAL | 3 refills | Status: DC

## 2022-05-04 MED ORDER — DOXYCYCLINE HYCLATE 100 MG PO TABS: 100.0000 mg | ORAL_TABLET | Freq: Two times a day (BID) | ORAL | 0 refills | Status: AC

## 2022-05-04 MED ORDER — BENZONATATE 200 MG PO CAPS: 200.0000 mg | ORAL_CAPSULE | Freq: Two times a day (BID) | ORAL | 0 refills | Status: DC | PRN

## 2022-05-04 MED ORDER — HYDROCHLOROTHIAZIDE 25 MG PO TABS: 25.0000 mg | ORAL_TABLET | Freq: Every day | ORAL | 3 refills | Status: DC

## 2022-05-04 MED ORDER — LISINOPRIL 20 MG PO TABS
20.0000 mg | ORAL_TABLET | Freq: Every day | ORAL | 3 refills | Status: AC
Start: 2022-05-04 — End: ?

## 2022-05-04 NOTE — Progress Notes (Signed)
Telephone visit  Subjective: CC: cough PCP: [name], DO Evan Stanley is a 81 y.o. male calls for telephone consult today. Patient provides verbal consent for consult held via phone.  Due to COVID-19 pandemic this visit was conducted virtually. This visit type was conducted due to national recommendations for restrictions regarding the COVID-19 Pandemic (e.g. social distancing, sheltering in place) in an effort to limit this patient's exposure and mitigate transmission in our community. All issues noted in this document were discussed and addressed.  A physical exam was not performed with this format.   Location of patient: home Location of provider: [facility] Others present for call: none  1. Cough Onset of cough on Tuesday and it really got worse over the weekend. He is coughing up phlegm but it is clear/ lightly opaque.  No fevers. No myalgia or headache. He is using Tessalon perles and nyquil with some relief.  He is a former smoker that stopped in 1980. Denies wheezing.    ROS: Per HPI  Allergies  Allergen Reactions   Baclofen Other (See Comments)    Stroke like symptoms   Morphine And Related Other (See Comments)    Delirium    Past Medical History:  Diagnosis Date   AKI (acute kidney injury) ([place])    Allergy    Anxiety    Arthritis    Right shoulder   Atrial fibrillation (HCC)    Carotid artery stenosis    Cataract    CHF (congestive heart failure) (HCC)    CVA (cerebral vascular accident) ([place]) 04/28/2016   Decreased vision    left eye   Diabetes mellitus without complication ([place])    Takes Metformin   Hyperlipidemia    Hypertension    Salmonella bacteremia 04/29/2016   Sepsis ([place]) 04/29/2016   Stroke ([place]) 11/18/2015   no deficits   TIA (transient ischemic attack)    affected left eye   Urgency of urination     Current Outpatient Medications:    acetaminophen (TYLENOL) 325 MG tablet, Take 2 tablets (650 mg total) by mouth every 6 (six) hours  as needed for mild pain (or Fever >/= 101)., Disp: , Rfl:    atorvastatin (LIPITOR) 40 MG tablet, TAKE 1 TABLET BY MOUTH DAILY, Disp: 100 tablet, Rfl: 0   atropine 1 % ophthalmic solution, Place 1 drop into the left eye 2 (two) times daily., Disp: 3 mL, Rfl: 11   cetirizine (ZYRTEC) 10 MG tablet, Take 1 tablet by mouth as needed for allergies., Disp: , Rfl:    cholecalciferol (VITAMIN D3) 25 MCG (1000 UNIT) tablet, Take 1,000 Units by mouth daily., Disp: , Rfl:    cyanocobalamin 1000 MCG tablet, Take 1,000 mcg by mouth daily., Disp: , Rfl:    Dulaglutide (TRULICITY) 1.5 NW/2.9FA SOPN, Inject 1.5 mg into the skin once a week. Inject the contents of one pen once per week, Disp: 6 mL, Rfl: 5   ELIQUIS 5 MG TABS tablet, TAKE 1 TABLET BY MOUTH TWICE  DAILY, Disp: 180 tablet, Rfl: 1   hydrochlorothiazide (HYDRODIURIL) 25 MG tablet, TAKE 1 TABLET BY MOUTH  DAILY, Disp: 100 tablet, Rfl: 0   insulin aspart (NOVOLOG) 100 UNIT/ML injection, Inject 9 Units into the skin in the morning and at bedtime. Pt takes 9 units before breakfast, and 8 units before supper., Disp: , Rfl:    insulin degludec (TRESIBA FLEXTOUCH) 200 UNIT/ML FlexTouch Pen, Inject 52 Units into the skin daily., Disp: , Rfl:    latanoprost (  XALATAN) 0.005 % ophthalmic solution, Place 1 drop into the left eye at bedtime. , Disp: , Rfl:    lisinopril (ZESTRIL) 20 MG tablet, TAKE 1 TABLET BY MOUTH DAILY, Disp: 100 tablet, Rfl: 0   memantine (NAMENDA TITRATION PACK) tablet pack, 5 MG/DAY FOR =1 WEEK 5 MG TWICE DAILY FOR =1 WEEK 15 MG/DAY GIVEN IN 5 MG AND 10 MG SEPARATED DOSES FOR =1 WEEK THEN 10 MG TWICE DAILY, Disp: 147 tablet, Rfl: 1   memantine (NAMENDA) 10 MG tablet, Take 1 tablet (10 mg total) by mouth 2 (two) times daily., Disp: 60 tablet, Rfl: 5   metoprolol succinate (TOPROL-XL) 100 MG 24 hr tablet, TAKE 1 TABLET BY MOUTH  DAILY WITH OR IMMEDIATELY  FOLLOWING A MEAL, Disp: 90 tablet, Rfl: 3   ONETOUCH ULTRA test strip, CHECK BLOOD SUGAR  TWICE  DAILY Dx E11.42, Disp: 200 strip, Rfl: 3   UNABLE TO FIND, Diet: NAS and consistent carbohydrate, Disp: , Rfl:    vitamin C (ASCORBIC ACID) 500 MG tablet, Take 500 mg by mouth daily., Disp: , Rfl:    vitamin E 1000 UNIT capsule, Take 1,000 Units by mouth daily., Disp: , Rfl:   Assessment/ Plan: 81 y.o. male   Acute bacterial bronchitis - Plan: benzonatate (TESSALON) 200 MG capsule, doxycycline (VIBRA-TABS) 100 MG tablet  Empiric treatment for presumed acute bacterial bronchitis.  Patient has smoking history but is not having any wheezing so doubt this is a COPD exacerbation presentation.  Tessalon Perles, doxycycline given renal dysfunction sent.  Advised his wife against use of medications that were not renal safe.  Push hydration.  Discussed the potential of missed COVID-19 diagnosis given lack of testing.  Nevertheless he is outside of the treatment window.  We discussed that if symptoms are progressive or develop any red flag signs or symptoms he is to seek immediate medical attention  Start time: 10:48a End time: 10:53am  Total time spent on patient care (including telephone call/ virtual visit): 5 minutes  [place], [place] [phone]

## 2022-05-08 DIAGNOSIS — Z0289 Encounter for other administrative examinations: Secondary | ICD-10-CM

## 2022-05-21 ENCOUNTER — Telehealth: Payer: Self-pay

## 2022-05-21 NOTE — Telephone Encounter (Signed)
[employer] renewal application to patient home.   Sandre [employer] Patient Advocate

## 2022-06-02 ENCOUNTER — Telehealth: Payer: Self-pay | Admitting: Family Medicine

## 2022-06-02 ENCOUNTER — Telehealth: Payer: Self-pay

## 2022-06-02 NOTE — Telephone Encounter (Signed)
Pt has forms for patient assistance- aware she needs ppw for most recent tax papers

## 2022-06-02 NOTE — Telephone Encounter (Signed)
Please send PAP [employer] trulicity 6.7PC weekly They received the novo nordisk paperwork Patient sees endo who should be doing the paperwork   Thanks!

## 2022-06-02 NOTE — Telephone Encounter (Signed)
Patient trying to complete forms for patient assiatnce- has ppw for tresiba and novalog but does not have ppw for trulicity

## 2022-06-02 NOTE — Telephone Encounter (Signed)
Pts wife called back stating that she's got the forms for [place] and Novolog but does not have anything for Trulicity.

## 2022-06-08 NOTE — Telephone Encounter (Signed)
Mailed [employer] re-enrollment app to pt's home.

## 2022-06-10 ENCOUNTER — Other Ambulatory Visit: Payer: Medicare Other

## 2022-06-10 DIAGNOSIS — E1122 Type 2 diabetes mellitus with diabetic chronic kidney disease: Secondary | ICD-10-CM | POA: Diagnosis not present

## 2022-06-10 DIAGNOSIS — R809 Proteinuria, unspecified: Secondary | ICD-10-CM | POA: Diagnosis not present

## 2022-06-10 DIAGNOSIS — E1129 Type 2 diabetes mellitus with other diabetic kidney complication: Secondary | ICD-10-CM | POA: Diagnosis not present

## 2022-06-10 DIAGNOSIS — I129 Hypertensive chronic kidney disease with stage 1 through stage 4 chronic kidney disease, or unspecified chronic kidney disease: Secondary | ICD-10-CM | POA: Diagnosis not present

## 2022-06-10 DIAGNOSIS — N189 Chronic kidney disease, unspecified: Secondary | ICD-10-CM | POA: Diagnosis not present

## 2022-06-25 DIAGNOSIS — B351 Tinea unguium: Secondary | ICD-10-CM | POA: Diagnosis not present

## 2022-06-25 DIAGNOSIS — L84 Corns and callosities: Secondary | ICD-10-CM | POA: Diagnosis not present

## 2022-06-25 DIAGNOSIS — E1142 Type 2 diabetes mellitus with diabetic polyneuropathy: Secondary | ICD-10-CM | POA: Diagnosis not present

## 2022-06-25 DIAGNOSIS — M79676 Pain in unspecified toe(s): Secondary | ICD-10-CM | POA: Diagnosis not present

## 2022-07-06 ENCOUNTER — Ambulatory Visit (INDEPENDENT_AMBULATORY_CARE_PROVIDER_SITE_OTHER): Payer: Medicare Other

## 2022-07-06 VITALS — Ht 65.0 in | Wt 167.0 lb

## 2022-07-06 DIAGNOSIS — Z0000 Encounter for general adult medical examination without abnormal findings: Secondary | ICD-10-CM

## 2022-07-06 NOTE — Patient Instructions (Signed)
Evan Stanley , Thank you for taking time to come for your Medicare Wellness Visit. I appreciate your ongoing commitment to your health goals. Please review the following plan we discussed and let me know if I can assist you in the future.   These are the goals we discussed:  Goals       AWV      05/24/2019 AWV Goal: Exercise for General Health  Patient will verbalize understanding of the benefits of increased physical activity: Exercising regularly is important. It will improve your overall fitness, flexibility, and endurance. Regular exercise also will improve your overall health. It can help you control your weight, reduce stress, and improve your bone density. Over the next year, patient will increase physical activity as tolerated with a goal of at least 150 minutes of moderate physical activity per week.  You can tell that you are exercising at a moderate intensity if your heart starts beating faster and you start breathing faster but can still hold a conversation. Moderate-intensity exercise ideas include: Walking 1 mile (1.6 km) in about 15 minutes Biking Hiking Golfing Dancing Water aerobics Patient will verbalize understanding of everyday activities that increase physical activity by providing examples like the following: Yard work, such as: Sales promotion account executive Gardening Washing windows or floors Patient will be able to explain general safety guidelines for exercising:  Before you start a new exercise program, talk with your health care provider. Do not exercise so much that you hurt yourself, feel dizzy, or get very short of breath. Wear comfortable clothes and wear shoes with good support. Drink plenty of water while you exercise to prevent dehydration or heat stroke. Work out until your breathing and your heartbeat get faster.  05/24/2019 AWV Goal: Fall Prevention  Over the next year, patient  will decrease their risk for falls by: Using assistive devices, such as a cane or walker, as needed Identifying fall risks within their home and correcting them by: Removing throw rugs Adding handrails to stairs or ramps Removing clutter and keeping a clear pathway throughout the home Increasing light, especially at night Adding shower handles/bars Raising toilet seat Identifying potential personal risk factors for falls: Medication side effects Incontinence/urgency Vestibular dysfunction Hearing loss Musculoskeletal disorders Neurological disorders Orthostatic hypotension        Exercise 150 min/wk Moderate Activity      Patient Stated (pt-stated)      Current Barriers:  Knowledge Deficits related to prescription assistance programs Financial Constraints.   Nurse Case Manager Clinical Goal(s):  Over the next 30 days, patient will work with RN Case Manager to address needs related to diabetes  Over the next 30 days, patient will work with CM team pharmacist to apply for prescription assistance  Interventions:  Advised patient to expect a telephone call from [place] team and that they will likely be asked questions related to income and finances.  Reviewed medications with patient and discussed [name], Humalog, and Trulicity dose and cost Discussed plans with patient for ongoing care management follow up and provided patient with direct contact information for care management team Pharmacy referral for helpt with patient prescription assistance   Patient Self Care Activities:  Performs ADL's independently Performs IADL's independently  Has some help from his wife  Initial goal documentation        Patient Stated      07/02/2020 AWV Goal: Fall Prevention  Over the next year,  patient will decrease their risk for falls by: Using assistive devices, such as a cane or walker, as needed Identifying fall risks within their home and correcting them  by: Removing throw rugs Adding handrails to stairs or ramps Removing clutter and keeping a clear pathway throughout the home Increasing light, especially at night Adding shower handles/bars Raising toilet seat Identifying potential personal risk factors for falls: Medication side effects Incontinence/urgency Vestibular dysfunction Hearing loss Musculoskeletal disorders Neurological disorders Orthostatic hypotension        T2DM, CKD (pt-stated)      Current Barriers:  Unable to independently afford treatment regimen Suboptimal therapeutic regimen for T2DM, CKD  Pharmacist Clinical Goal(s):  patient will verbalize ability to afford treatment regimen maintain control of T2DM as evidenced by CONTINUED GOAL A1C<7%  adhere to plan to optimize therapeutic regimen for T2DM/CKD as evidenced by report of adherence to recommended medication management changes through collaboration with PharmD and provider.    Interventions: 1:1 collaboration with [name], DO regarding development and update of comprehensive plan of care as evidenced by provider attestation and co-signature Inter-disciplinary care team collaboration (see longitudinal plan of care) Comprehensive medication review performed; medication list updated in electronic medical record  Diabetes: New goal. Controlled-A1C 6.5%; current treatment: TRESIBA, NOVOLOG, TRULICITY;  CONTINUE CURRENT REGIMEN CONSIDER SGLT2 FOR T2DM & CKD -- GFR 35 (WILL DISCUSS WITH PCP) PATIENT APPROVED FOR ASSISTANCE VIA NOVO [place] PAP (INSULINS) AND LILLY CARES PAP (TRULICITY) Denies personal and family history of Medullary thyroid cancer (MTC) Current glucose readings: fasting glucose: <130, post prandial glucose: <180 Denies hypoglycemic/hyperglycemic symptoms Discussed meal planning options and Plate method for healthy eating Avoid sugary drinks and desserts Incorporate balanced protein, non starchy veggies, 1 serving of carbohydrate  with each meal Increase water intake Increase physical activity as able Recommended SGLT2 PATIENT ENROLLED FOR 2023 PATIENT ASSISTANCE   Patient Goals/Self-Care Activities patient will:  - take medications as prescribed as evidenced by patient report and record review check glucose DAILY, document, and provide at future appointments collaborate with provider on medication access solutions target a minimum of 150 minutes of moderate intensity exercise weekly engage in dietary modifications by FOLLOWING A HEART HEALTHY DIET/HEALTHY PLATE METHOD          This is a list of the screening recommended for you and due dates:  Health Maintenance  Topic Date Due   DTaP/Tdap/Td vaccine (2 - Td or Tdap) 03/04/2021   Complete foot exam   09/20/2021   COVID-19 Vaccine (4 - 2023-24 season) 03/20/2022   Zoster (Shingles) Vaccine (1 of 2) 09/18/2022*   Flu Shot  10/18/2022*   Eye exam for diabetics  09/25/2022   Hemoglobin A1C  09/30/2022   Yearly kidney function blood test for diabetes  03/03/2023   Yearly kidney health urinalysis for diabetes  03/03/2023   Medicare Annual Wellness Visit  07/07/2023   Pneumonia Vaccine  Completed   Hepatitis C Screening: USPSTF Recommendation to screen - Ages 18-79 yo.  Completed   HPV Vaccine  Aged Out   Colon Cancer Screening  Discontinued  *Topic was postponed. The date shown is not the original due date.    Advanced directives: Advance directive discussed with you today. I have provided a copy for you to complete at home and have notarized. Once this is complete please bring a copy in to our office so we can scan it into your chart.   Conditions/risks identified: Aim for 30 minutes of exercise or brisk walking, 6-8 glasses  of water, and 5 servings of fruits and vegetables each day.   Next appointment: Follow up in one year for your annual wellness visit.   Preventive Care 68 Years and Older, Male  Preventive care refers to lifestyle choices and  visits with your health care provider that can promote health and wellness. What does preventive care include? A yearly physical exam. This is also called an annual well check. Dental exams once or twice a year. Routine eye exams. Ask your health care provider how often you should have your eyes checked. Personal lifestyle choices, including: Daily care of your teeth and gums. Regular physical activity. Eating a healthy diet. Avoiding tobacco and drug use. Limiting alcohol use. Practicing safe sex. Taking low doses of aspirin every day. Taking vitamin and mineral supplements as recommended by your health care provider. What happens during an annual well check? The services and screenings done by your health care provider during your annual well check will depend on your age, overall health, lifestyle risk factors, and family history of disease. Counseling  Your health care provider may ask you questions about your: Alcohol use. Tobacco use. Drug use. Emotional well-being. Home and relationship well-being. Sexual activity. Eating habits. History of falls. Memory and ability to understand (cognition). Work and work Statistician. Screening  You may have the following tests or measurements: Height, weight, and BMI. Blood pressure. Lipid and cholesterol levels. These may be checked every 5 years, or more frequently if you are over 18 years old. Skin check. Lung cancer screening. You may have this screening every year starting at age 83 if you have a 30-pack-year history of smoking and currently smoke or have quit within the past 15 years. Fecal occult blood test (FOBT) of the stool. You may have this test every year starting at age 51. Flexible sigmoidoscopy or colonoscopy. You may have a sigmoidoscopy every 5 years or a colonoscopy every 10 years starting at age 77. Prostate cancer screening. Recommendations will vary depending on your family history and other risks. Hepatitis C  blood test. Hepatitis B blood test. Sexually transmitted disease (STD) testing. Diabetes screening. This is done by checking your blood sugar (glucose) after you have not eaten for a while (fasting). You may have this done every 1-3 years. Abdominal aortic aneurysm (AAA) screening. You may need this if you are a current or former smoker. Osteoporosis. You may be screened starting at age 78 if you are at high risk. Talk with your health care provider about your test results, treatment options, and if necessary, the need for more tests. Vaccines  Your health care provider may recommend certain vaccines, such as: Influenza vaccine. This is recommended every year. Tetanus, diphtheria, and acellular pertussis (Tdap, Td) vaccine. You may need a Td booster every 10 years. Zoster vaccine. You may need this after age 26. Pneumococcal 13-valent conjugate (PCV13) vaccine. One dose is recommended after age 7. Pneumococcal polysaccharide (PPSV23) vaccine. One dose is recommended after age 70. Talk to your health care provider about which screenings and vaccines you need and how often you need them. This information is not intended to replace advice given to you by your health care provider. Make sure you discuss any questions you have with your health care provider. Document Released: 08/02/2015 Document Revised: 03/25/2016 Document Reviewed: 05/07/2015 Elsevier Interactive Patient Education  2017 [employer] Prevention in the Home Falls can cause injuries. They can happen to people of all ages. There are many things you can do  to make your home safe and to help prevent falls. What can I do on the outside of my home? Regularly fix the edges of walkways and driveways and fix any cracks. Remove anything that might make you trip as you walk through a door, such as a raised step or threshold. Trim any bushes or trees on the path to your home. Use bright outdoor lighting. Clear any walking paths of  anything that might make someone trip, such as rocks or tools. Regularly check to see if handrails are loose or broken. Make sure that both sides of any steps have handrails. Any raised decks and porches should have guardrails on the edges. Have any leaves, snow, or ice cleared regularly. Use sand or salt on walking paths during winter. Clean up any spills in your garage right away. This includes oil or grease spills. What can I do in the bathroom? Use night lights. Install grab bars by the toilet and in the tub and shower. Do not use towel bars as grab bars. Use non-skid mats or decals in the tub or shower. If you need to sit down in the shower, use a plastic, non-slip stool. Keep the floor dry. Clean up any water that spills on the floor as soon as it happens. Remove soap buildup in the tub or shower regularly. Attach bath mats securely with double-sided non-slip rug tape. Do not have throw rugs and other things on the floor that can make you trip. What can I do in the bedroom? Use night lights. Make sure that you have a light by your bed that is easy to reach. Do not use any sheets or blankets that are too big for your bed. They should not hang down onto the floor. Have a firm chair that has side arms. You can use this for support while you get dressed. Do not have throw rugs and other things on the floor that can make you trip. What can I do in the kitchen? Clean up any spills right away. Avoid walking on wet floors. Keep items that you use a lot in easy-to-reach places. If you need to reach something above you, use a strong step stool that has a grab bar. Keep electrical cords out of the way. Do not use floor polish or wax that makes floors slippery. If you must use wax, use non-skid floor wax. Do not have throw rugs and other things on the floor that can make you trip. What can I do with my stairs? Do not leave any items on the stairs. Make sure that there are handrails on both  sides of the stairs and use them. Fix handrails that are broken or loose. Make sure that handrails are as long as the stairways. Check any carpeting to make sure that it is firmly attached to the stairs. Fix any carpet that is loose or worn. Avoid having throw rugs at the top or bottom of the stairs. If you do have throw rugs, attach them to the floor with carpet tape. Make sure that you have a light switch at the top of the stairs and the bottom of the stairs. If you do not have them, ask someone to add them for you. What else can I do to help prevent falls? Wear shoes that: Do not have high heels. Have rubber bottoms. Are comfortable and fit you well. Are closed at the toe. Do not wear sandals. If you use a stepladder: Make sure that it is fully opened. Do  not climb a closed stepladder. Make sure that both sides of the stepladder are locked into place. Ask someone to hold it for you, if possible. Clearly mark and make sure that you can see: Any grab bars or handrails. First and last steps. Where the edge of each step is. Use tools that help you move around (mobility aids) if they are needed. These include: Canes. Walkers. Scooters. Crutches. Turn on the lights when you go into a dark area. Replace any light bulbs as soon as they burn out. Set up your furniture so you have a clear path. Avoid moving your furniture around. If any of your floors are uneven, fix them. If there are any pets around you, be aware of where they are. Review your medicines with your doctor. Some medicines can make you feel dizzy. This can increase your chance of falling. Ask your doctor what other things that you can do to help prevent falls. This information is not intended to replace advice given to you by your health care provider. Make sure you discuss any questions you have with your health care provider. Document Released: 05/02/2009 Document Revised: 12/12/2015 Document Reviewed: 08/10/2014 Elsevier  Interactive Patient Education  2017 [employer].

## 2022-07-06 NOTE — Progress Notes (Signed)
Subjective:   Evan Stanley is a 81 y.o. male who presents for Medicare Annual/Subsequent preventive examination. I connected with  Virgel Bouquet on 07/06/22 by a audio enabled telemedicine application and verified that I am speaking with the correct person using two identifiers.  Patient Location: Home  Provider Location: Home Office  I discussed the limitations of evaluation and management by telemedicine. The patient expressed understanding and agreed to proceed.  Review of Systems     Cardiac Risk Factors include: advanced age (>52mn, >>74women);diabetes mellitus;male gender;hypertension     Objective:    Today's Vitals   07/06/22 1322  Weight: 167 lb (75.8 kg)  Height: _0  (1.651 m)   Body mass index is 27.79 kg/m.     07/06/2022    1:28 PM 07/03/2021    1:58 PM 07/02/2020    1:30 PM 05/20/2020    2:58 PM 05/17/2020    9:46 AM 05/14/2020    8:24 AM 05/10/2020   10:20 AM  Advanced Directives  Does Patient Have a Medical Advance Directive? No Yes No Yes No Yes No  Type of [place]       Does patient want to make changes to medical advance directive?    No - Patient declined No - Patient declined No - Patient declined   Copy of [place]?  No - copy requested       Would patient like information on creating a medical advance directive? No - Patient declined  No - Patient declined    No - Patient declined    Current Medications (verified) Outpatient Encounter Medications as of 07/06/2022  Medication Sig   acetaminophen (TYLENOL) 325 MG tablet Take 2 tablets (650 mg total) by mouth every 6 (six) hours as needed for mild pain (or Fever >/= 101).   atorvastatin (LIPITOR) 40 MG tablet Take 1 tablet (40 mg total) by mouth daily.   benzonatate (TESSALON) 200 MG capsule Take 1 capsule (200 mg total) by mouth 2 (two) times daily as needed for cough.   cetirizine (ZYRTEC) 10 MG tablet Take 1  tablet by mouth as needed for allergies.   cholecalciferol (VITAMIN D3) 25 MCG (1000 UNIT) tablet Take 1,000 Units by mouth daily.   cyanocobalamin 1000 MCG tablet Take 1,000 mcg by mouth daily.   Dulaglutide (TRULICITY) 1.5 MDP/8.2UMSOPN Inject 1.5 mg into the skin once a week. Inject the contents of one pen once per week   ELIQUIS 5 MG TABS tablet TAKE 1 TABLET BY MOUTH TWICE  DAILY   hydrochlorothiazide (HYDRODIURIL) 25 MG tablet Take 1 tablet (25 mg total) by mouth daily.   insulin aspart (NOVOLOG) 100 UNIT/ML injection Inject 9 Units into the skin in the morning and at bedtime. Pt takes 9 units before breakfast, and 8 units before supper.   insulin degludec (TRESIBA FLEXTOUCH) 200 UNIT/ML FlexTouch Pen Inject 52 Units into the skin daily.   latanoprost (XALATAN) 0.005 % ophthalmic solution Place 1 drop into the left eye at bedtime.    lisinopril (ZESTRIL) 20 MG tablet Take 1 tablet (20 mg total) by mouth daily.   memantine (NAMENDA) 10 MG tablet TAKE 1 TABLET BY MOUTH TWICE A DAY   metoprolol succinate (TOPROL-XL) 100 MG 24 hr tablet TAKE 1 TABLET BY MOUTH  DAILY WITH OR IMMEDIATELY  FOLLOWING A MEAL   ONETOUCH ULTRA test strip CHECK BLOOD SUGAR TWICE  DAILY Dx EP53.61  UNABLE TO FIND Diet: NAS and consistent carbohydrate   vitamin C (ASCORBIC ACID) 500 MG tablet Take 500 mg by mouth daily.   vitamin E 1000 UNIT capsule Take 1,000 Units by mouth daily.   atropine 1 % ophthalmic solution Place 1 drop into the left eye 2 (two) times daily.   No facility-administered encounter medications on file as of 07/06/2022.    Allergies (verified) Baclofen and Morphine and related   History: Past Medical History:  Diagnosis Date   AKI (acute kidney injury) ([place])    Allergy    Anxiety    Arthritis    Right shoulder   Atrial fibrillation (HCC)    Carotid artery stenosis    Cataract    CHF (congestive heart failure) (HCC)    CVA (cerebral vascular accident) ([place]) 04/28/2016   Decreased  vision    left eye   Diabetes mellitus without complication ([place])    Takes Metformin   Hyperlipidemia    Hypertension    Salmonella bacteremia 04/29/2016   Sepsis ([place]) 04/29/2016   Stroke ([place]) 11/18/2015   no deficits   TIA (transient ischemic attack)    affected left eye   Urgency of urination    Past Surgical History:  Procedure Laterality Date   AORTIC VALVE REPLACEMENT     BACK SURGERY  80s   CARDIAC VALVE REPLACEMENT  11/01/2008   21-mm Edwards Pericardial Magna - Ease valve   ENDARTERECTOMY Right 01/13/2016   Procedure: ENDARTERECTOMY CAROTID - RIGHT;  Surgeon: Conrad Keya Paha, MD;  Location: [facility];  Service: Vascular;  Laterality: Right;   EYE SURGERY     laser surgery, left eye  Left 10-29-2015    retinal surgery/ Deloria Lair MD    PERIPHERAL VASCULAR CATHETERIZATION Right 11/18/2015   Procedure: Carotid Angiography;  Surgeon: Conrad White Mills, MD;  Location: [facility];  Service: Cardiovascular;  Laterality: Right;   PERIPHERAL VASCULAR CATHETERIZATION N/A 11/18/2015   Procedure: Aortic Arch Angiography;  Surgeon: Conrad Port Dickinson, MD;  Location: [facility];  Service: Cardiovascular;  Laterality: N/A;   TEE WITHOUT CARDIOVERSION N/A 05/05/2016   Procedure: TRANSESOPHAGEAL ECHOCARDIOGRAM (TEE);  Surgeon: Lelon Perla, MD;  Location[facility] ENDOS[facility];  Service: Cardiovascular;  Laterality: N/A;   Family History  Problem Relation Age of Onset   Heart disease Mother    Cancer Mother        breast   Stroke Father 25   Hypertension Father    Early death Sister        infant death   Heart disease Brother    Hydrocephalus Brother    Heart disease Brother    Diabetes Brother    Cancer Brother        prostat   Healthy Son    Healthy Son    Social History   Socioeconomic History   Marital status: Married    Spouse name: [place]   Number of children: 2   Years of education: 11   Highest education level: 11th grade  Occupational History   Occupation: maintenance     Employer: [employer]    Comment: Retired  Tobacco Use   Smoking status: Former    Packs/day: 2.00    Years: 20.00    Total pack years: 40.00    Types: Cigarettes    Quit date: 07/20/1986    Years since quitting: 35.9   Smokeless tobacco: Never  Vaping Use   Vaping Use: Never used  Substance and Sexual  Activity   Alcohol use: No    Alcohol/week: 0.0 standard drinks of alcohol   Drug use: No   Sexual activity: Yes  Other Topics Concern   Not on file  Social History Narrative   Lives with wife in home   Right Handed   Drinks 6-7 cups caffeine daily   Social Determinants of Health   Financial Resource Strain: Low Risk  (07/06/2022)   Overall Financial Resource Strain (CARDIA)    Difficulty of Paying Living Expenses: Not hard at all  Food Insecurity: No Food Insecurity (07/06/2022)   Hunger Vital Sign    Worried About Running Out of Food in the Last Year: Never true    Ran Out of Food in the Last Year: Never true  Transportation Needs: No Transportation Needs (07/06/2022)   PRAPARE - Hydrologist (Medical): No    Lack of Transportation (Non-Medical): No  Physical Activity: Inactive (07/06/2022)   Exercise Vital Sign    Days of Exercise per Week: 0 days    Minutes of Exercise per Session: 0 min  Stress: No Stress Concern Present (07/06/2022)   [place]    Feeling of Stress : Not at all  Social Connections: Moderately Isolated (07/06/2022)   Social Connection and Isolation Panel [NHANES]    Frequency of Communication with Friends and Family: More than three times a week    Frequency of Social Gatherings with Friends and Family: More than three times a week    Attends Religious Services: Never    Marine scientist or Organizations: No    Attends Music therapist: Never    Marital Status: Married    Tobacco Counseling Counseling given: Not Answered   Clinical  Intake:  Pre-visit preparation completed: Yes  Pain : No/denies pain     Nutritional Risks: None Diabetes: Yes CBG done?: No Did pt. bring in CBG monitor from home?: No  How often do you need to have someone help you when you read instructions, pamphlets, or other written materials from your doctor or pharmacy?: 1 - Never  Diabetic?yes  Nutrition Risk Assessment:  Has the patient had any N/V/D within the last 2 months?  No  Does the patient have any non-healing wounds?  No  Has the patient had any unintentional weight loss or weight gain?  No   Diabetes:  Is the patient diabetic?  Yes  If diabetic, was a CBG obtained today?  No  Did the patient bring in their glucometer from home?  No  How often do you monitor your CBG's? 2 x day .   Financial Strains and Diabetes Management:  Are you having any financial strains with the device, your supplies or your medication? No .  Does the patient want to be seen by Chronic Care Management for management of their diabetes?  No  Would the patient like to be referred to a Nutritionist or for Diabetic Management?  No   Diabetic Exams:  Diabetic Eye Exam: Completed 09/2021 Diabetic Foot Exam: Overdue, Pt has been advised about the importance in completing this exam. Pt is scheduled for diabetic foot exam on next office visit .  Interpreter Needed?: No  Information entered by :: Jadene Pierini, LPN   Activities of Daily Living    07/06/2022    1:28 PM  In your present state of health, do you have any difficulty performing the following activities:  Hearing? 0  Vision? 0  Difficulty concentrating or making decisions? 0  Walking or climbing stairs? 0  Dressing or bathing? 0  Doing errands, shopping? 0  Preparing Food and eating ? N  Using the Toilet? N  In the past six months, have you accidently leaked urine? N  Do you have problems with loss of bowel control? N  Managing your Medications? N  Managing your Finances? N   Housekeeping or managing your Housekeeping? N    Patient Care Team: [name], DO as PCP - General (Family Medicine) Belva Crome, MD as PCP - Cardiology (Cardiology) Zadie Rhine Clent Demark, MD as Consulting Physician (Ophthalmology) Clent Jacks, MD as Consulting Physician (Ophthalmology) [name], MD as Consulting Physician (Endocrinology) Lavera Guise, [facility] (Pharmacist) Marcial Pacas, MD as Consulting Physician (Neurology) [facility] Steffanie Rainwater, DPM as Consulting Physician (Podiatry) Blanca Friend, Royce Macadamia, [facility] as [facility] (Pharmacist)  Indicate any recent Medical Services you may have received from other than [facility] providers in the past year (date may be approximate).     Assessment:   This is a routine wellness examination for [place].  Hearing/Vision screen Vision Screening - Comments:: Wears rx glasses - up to date with routine eye exams with  Dr.Groat   Dietary issues and exercise activities discussed: Current Exercise Habits: The patient does not participate in regular exercise at present   Goals Addressed             This Visit's Progress    Exercise 150 min/wk Moderate Activity   Not on track      Depression Screen    07/06/2022    1:26 PM 03/02/2022    9:43 AM 10/29/2021    8:36 AM 07/03/2021    2:00 PM 07/02/2021    8:38 AM 05/06/2021    3:15 PM 04/04/2021    2:58 PM  PHQ 2/9 Scores  PHQ - 2 Score 0 1 0 0 0 3 0  PHQ- 9 Score  1  2 0 9 4    Fall Risk    07/06/2022    1:25 PM 03/02/2022    9:43 AM 10/29/2021    8:36 AM 07/03/2021    1:20 PM 07/02/2021    8:38 AM  Fall Risk   Falls in the past year? 0 1 0 1 1  Number falls in past yr: 0 1  0 0  Injury with Fall? 0 1  0 0  Risk for fall due to : No Fall Risks History of fall(s)  History of fall(s);Impaired balance/gait;Orthopedic patient;Mental status change History of fall(s)  Follow up Falls prevention discussed Education provided  Education  provided;Falls prevention discussed Education provided    FALL RISK PREVENTION PERTAINING TO THE HOME:  Any stairs in or around the home? Yes  If so, are there any without handrails? No  Home free of loose throw rugs in walkways, pet beds, electrical cords, etc? Yes  Adequate lighting in your home to reduce risk of falls? Yes   ASSISTIVE DEVICES UTILIZED TO PREVENT FALLS:  Life alert? No  Use of a cane, walker or w/c? Yes  Grab bars in the bathroom? Yes  Shower chair or bench in shower? Yes  Elevated toilet seat or a handicapped toilet? Yes       10/29/2021    9:08 AM 11/10/2017    9:20 AM 11/04/2016    8:52 AM  MMSE - Mini Mental State Exam  Orientation to time 5 5  4  Orientation to Place _0 Registration _1 Attention/ Calculation 3 0 1  Attention/Calculation-comments  not attempted   Recall _2 Language- name 2 objects _3 Language- repeat _4 Language- follow 3 step command _5 Language- read & follow direction _6 Write a sentence _7 Copy design _8 Total score _9 03/04/2022   11:57 AM 04/14/2021    5:00 PM  Montreal Cognitive Assessment   Visuospatial/ Executive (0/5) 3 1  Naming (0/3) 3 3  Attention: Read list of digits (0/2) 2 2  Attention: Read list of letters (0/1) 1 1  Attention: Serial 7 subtraction starting at 100 (0/3) 0 0  Language: Repeat phrase (0/2) 1 1  Language : Fluency (0/1) 1 0  Abstraction (0/2) 0 0  Delayed Recall (0/5) 0 0  Orientation (0/6) 6 5  Total 17 13      07/06/2022    1:29 PM 07/03/2021    2:08 PM 07/02/2020    1:31 PM 05/24/2019    9:22 AM  6CIT Screen  What Year? 0 points 0 points 0 points   What month? 0 points 0 points    What time? 0 points 0 points 0 points 0 points  Count back from 20 0 points 0 points 0 points 0 points  Months in reverse 0 points 2 points 0 points 0 points  Repeat phrase 0 points 2 points 0 points 0 points  Total Score 0 points 4 points       Immunizations Immunization History  Administered Date(s) Administered   Fluad Quad(high Dose 65+) 05/23/2019, 04/25/2020   Influenza Split 04/27/2016   Influenza Whole 05/26/2010   Influenza, High Dose Seasonal PF 05/20/2017, 05/23/2018   Influenza,inj,Quad PF,6+ Mos 06/14/2013, 04/16/2014, 06/18/2015   Influenza-Unspecified 05/14/2016, 05/20/2017, 05/23/2018   Moderna Sars-Covid-2 Vaccination 08/16/2019, 09/13/2019, 07/04/2020   Pneumococcal Conjugate-13 06/18/2015   Pneumococcal Polysaccharide-23 12/16/2016   Tdap 03/05/2011    TDAP status: Due, Education has been provided regarding the importance of this vaccine. Advised may receive this vaccine at local pharmacy or Health Dept. Aware to provide a copy of the vaccination record if obtained from local pharmacy or Health Dept. Verbalized acceptance and understanding.  Flu Vaccine status: Due, Education has been provided regarding the importance of this vaccine. Advised may receive this vaccine at local pharmacy or Health Dept. Aware to provide a copy of the vaccination record if obtained from local pharmacy or Health Dept. Verbalized acceptance and understanding.  Pneumococcal vaccine status: Up to date  Covid-19 vaccine status: Completed vaccines  Qualifies for Shingles Vaccine? Yes   Zostavax completed No   Shingrix Completed?: No.    Education has been provided regarding the importance of this vaccine. Patient has been advised to call insurance company to determine out of pocket expense if they have not yet received this vaccine. Advised may also receive vaccine at local pharmacy or Health Dept. Verbalized acceptance and understanding.  Screening Tests Health Maintenance  Topic Date Due   DTaP/Tdap/Td (2 - Td or Tdap) 03/04/2021   FOOT EXAM  09/20/2021   COVID-19 Vaccine (4 - 2023-24 season) 03/20/2022   Zoster Vaccines- Shingrix (1 of 2) 09/18/2022 (Originally 01/23/1960)   INFLUENZA VACCINE  10/18/2022 (Originally  02/17/2022)   OPHTHALMOLOGY EXAM  09/25/2022   HEMOGLOBIN A1C  09/30/2022   Diabetic kidney  evaluation - eGFR measurement  03/03/2023   Diabetic kidney evaluation - Urine ACR  03/03/2023   Medicare Annual Wellness (AWV)  07/07/2023   Pneumonia Vaccine 64+ Years old  Completed   Hepatitis C Screening  Completed   HPV VACCINES  Aged Out   COLONOSCOPY (Pts 45-1yr Insurance coverage will need to be confirmed)  Discontinued    Health Maintenance  Health Maintenance Due  Topic Date Due   DTaP/Tdap/Td (2 - Td or Tdap) 03/04/2021   FOOT EXAM  09/20/2021   COVID-19 Vaccine (4 - 2023-24 season) 03/20/2022    Colorectal cancer screening: No longer required.   Lung Cancer Screening: (Low Dose CT Chest recommended if Age 79-80years, 30 pack-year currently smoking OR have quit w/in 15years.) does not qualify.   Lung Cancer Screening Referral: n/a  Additional Screening:  Hepatitis C Screening: does not qualify;   Vision Screening: Recommended annual ophthalmology exams for early detection of glaucoma and other disorders of the eye. Is the patient up to date with their annual eye exam?  Yes  Who is the provider or what is the name of the office in which the patient attends annual eye exams? Dr.Groat  If pt is not established with a provider, would they like to be referred to a provider to establish care? No .   Dental Screening: Recommended annual dental exams for proper oral hygiene  Community Resource Referral / Chronic Care Management: CRR required this visit?  No   CCM required this visit?  No      Plan:     I have personally reviewed and noted the following in the patient's chart:   Medical and social history Use of alcohol, tobacco or illicit drugs  Current medications and supplements including opioid prescriptions. Patient is not currently taking opioid prescriptions. Functional ability and status Nutritional status Physical activity Advanced directives List of other  physicians Hospitalizations, surgeries, and ER visits in previous 12 months Vitals Screenings to include cognitive, depression, and falls Referrals and appointments  In addition, I have reviewed and discussed with patient certain preventive protocols, quality metrics, and best practice recommendations. A written personalized care plan for preventive services as well as general preventive health recommendations were provided to patient.     LDaphane Shepherd LPN   [id number]  Nurse Notes: Due TDAP /Flu vaccine

## 2022-07-08 ENCOUNTER — Encounter: Payer: Self-pay | Admitting: Family Medicine

## 2022-07-08 ENCOUNTER — Ambulatory Visit (INDEPENDENT_AMBULATORY_CARE_PROVIDER_SITE_OTHER): Payer: Medicare Other | Admitting: Family Medicine

## 2022-07-08 VITALS — BP 129/65 | HR 59 | Temp 98.2°F | Ht 65.0 in | Wt 167.4 lb

## 2022-07-08 DIAGNOSIS — I152 Hypertension secondary to endocrine disorders: Secondary | ICD-10-CM

## 2022-07-08 DIAGNOSIS — F01B11 Vascular dementia, moderate, with agitation: Secondary | ICD-10-CM | POA: Diagnosis not present

## 2022-07-08 DIAGNOSIS — Z23 Encounter for immunization: Secondary | ICD-10-CM | POA: Diagnosis not present

## 2022-07-08 DIAGNOSIS — E1169 Type 2 diabetes mellitus with other specified complication: Secondary | ICD-10-CM | POA: Diagnosis not present

## 2022-07-08 DIAGNOSIS — Z794 Long term (current) use of insulin: Secondary | ICD-10-CM

## 2022-07-08 DIAGNOSIS — E1159 Type 2 diabetes mellitus with other circulatory complications: Secondary | ICD-10-CM

## 2022-07-08 DIAGNOSIS — E785 Hyperlipidemia, unspecified: Secondary | ICD-10-CM

## 2022-07-08 DIAGNOSIS — E1122 Type 2 diabetes mellitus with diabetic chronic kidney disease: Secondary | ICD-10-CM

## 2022-07-08 DIAGNOSIS — N1832 Chronic kidney disease, stage 3b: Secondary | ICD-10-CM | POA: Diagnosis not present

## 2022-07-08 NOTE — Progress Notes (Signed)
Subjective: CC: Follow-up memory PCP: Evan Norlander, DO OXB:[id number] L Goodley is a 81 y.o. male presenting to clinic today for:  1.  Dementia Patient was seen by neurology and is currently treated with Namenda 10 mg twice daily.  He is accompanied today by his wife who notes that really his mood has gotten a lot better with this Namenda.  Memory seems to be pretty good 2.  Continues to have issues with balance but she thinks this has a lot to do with the fact that he essentially is blind in that left eye.  He utilizes a walker most the time for ambulation and does well with this.  2.  Type 2 diabetes with hypertension, hyperlipidemia and stage IIIb CKD Patient is managed by Dr. Dwyane Stanley with next office visit in January.  He is asking to have his labs done here so that he does not have to have them done in January.  Compliant with all medications.  Has not had a diabetic foot exam but does see Dr. Irving Stanley regularly for nail trimming.   ROS: Per HPI  Allergies  Allergen Reactions   Baclofen Other (See Comments)    Stroke like symptoms   Morphine And Related Other (See Comments)    Delirium    Past Medical History:  Diagnosis Date   AKI (acute kidney injury) ([place])    Allergy    Anxiety    Arthritis    Right shoulder   Atrial fibrillation (HCC)    Carotid artery stenosis    Cataract    CHF (congestive heart failure) (HCC)    CVA (cerebral vascular accident) ([place]) 04/28/2016   Decreased vision    left eye   Diabetes mellitus without complication ([place])    Takes Metformin   Hyperlipidemia    Hypertension    Salmonella bacteremia 04/29/2016   Sepsis ([place]) 04/29/2016   Stroke ([place]) 11/18/2015   no deficits   TIA (transient ischemic attack)    affected left eye   Urgency of urination     Current Outpatient Medications:    acetaminophen (TYLENOL) 325 MG tablet, Take 2 tablets (650 mg total) by mouth every 6 (six) hours as needed for mild pain (or Fever >/= 101)., Disp: , Rfl:     atorvastatin (LIPITOR) 40 MG tablet, Take 1 tablet (40 mg total) by mouth daily., Disp: 100 tablet, Rfl: 3   atropine 1 % ophthalmic solution, Place 1 drop into the left eye 2 (two) times daily., Disp: 3 mL, Rfl: 11   benzonatate (TESSALON) 200 MG capsule, Take 1 capsule (200 mg total) by mouth 2 (two) times daily as needed for cough., Disp: 20 capsule, Rfl: 0   cetirizine (ZYRTEC) 10 MG tablet, Take 1 tablet by mouth as needed for allergies., Disp: , Rfl:    cholecalciferol (VITAMIN D3) 25 MCG (1000 UNIT) tablet, Take 1,000 Units by mouth daily., Disp: , Rfl:    cyanocobalamin 1000 MCG tablet, Take 1,000 mcg by mouth daily., Disp: , Rfl:    Dulaglutide (TRULICITY) 1.5 ME/2.6ST SOPN, Inject 1.5 mg into the skin once a week. Inject the contents of one pen once per week, Disp: 6 mL, Rfl: 5   ELIQUIS 5 MG TABS tablet, TAKE 1 TABLET BY MOUTH TWICE  DAILY, Disp: 180 tablet, Rfl: 1   hydrochlorothiazide (HYDRODIURIL) 25 MG tablet, Take 1 tablet (25 mg total) by mouth daily., Disp: 100 tablet, Rfl: 3   insulin aspart (NOVOLOG) 100 UNIT/ML injection, Inject 9 Units into  the skin in the morning and at bedtime. Pt takes 9 units before breakfast, and 8 units before supper., Disp: , Rfl:    insulin degludec (TRESIBA FLEXTOUCH) 200 UNIT/ML FlexTouch Pen, Inject 52 Units into the skin daily., Disp: , Rfl:    latanoprost (XALATAN) 0.005 % ophthalmic solution, Place 1 drop into the left eye at bedtime. , Disp: , Rfl:    lisinopril (ZESTRIL) 20 MG tablet, Take 1 tablet (20 mg total) by mouth daily., Disp: 100 tablet, Rfl: 3   memantine (NAMENDA) 10 MG tablet, TAKE 1 TABLET BY MOUTH TWICE A DAY, Disp: 180 tablet, Rfl: 1   metoprolol succinate (TOPROL-XL) 100 MG 24 hr tablet, TAKE 1 TABLET BY MOUTH  DAILY WITH OR IMMEDIATELY  FOLLOWING A MEAL, Disp: 90 tablet, Rfl: 3   ONETOUCH ULTRA test strip, CHECK BLOOD SUGAR TWICE  DAILY Dx E11.42, Disp: 200 strip, Rfl: 3   UNABLE TO FIND, Diet: NAS and consistent  carbohydrate, Disp: , Rfl:    vitamin C (ASCORBIC ACID) 500 MG tablet, Take 500 mg by mouth daily., Disp: , Rfl:    vitamin E 1000 UNIT capsule, Take 1,000 Units by mouth daily., Disp: , Rfl:  Social History   Socioeconomic History   Marital status: Married    Spouse name: Evan Stanley   Number of children: 2   Years of education: 11   Highest education level: 11th grade  Occupational History   Occupation: maintenance    Employer: [employer]    Comment: Retired  Tobacco Use   Smoking status: Former    Packs/day: 2.00    Years: 20.00    Total pack years: 40.00    Types: Cigarettes    Quit date: 07/20/1986    Years since quitting: 35.9   Smokeless tobacco: Never  Vaping Use   Vaping Use: Never used  Substance and Sexual Activity   Alcohol use: No    Alcohol/week: 0.0 standard drinks of alcohol   Drug use: No   Sexual activity: Yes  Other Topics Concern   Not on file  Social History Narrative   Lives with wife in home   Right Handed   Drinks 6-7 cups caffeine daily   Social Determinants of Health   Financial Resource Strain: Low Risk  (07/06/2022)   Overall Financial Resource Strain (CARDIA)    Difficulty of Paying Living Expenses: Not hard at all  Food Insecurity: No Food Insecurity (07/06/2022)   Hunger Vital Sign    Worried About Running Out of Food in the Last Year: Never true    Ran Out of Food in the Last Year: Never true  Transportation Needs: No Transportation Needs (07/06/2022)   PRAPARE - Hydrologist (Medical): No    Lack of Transportation (Non-Medical): No  Physical Activity: Inactive (07/06/2022)   Exercise Vital Sign    Days of Exercise per Week: 0 days    Minutes of Exercise per Session: 0 min  Stress: No Stress Concern Present (07/06/2022)   [place]    Feeling of Stress : Not at all  Social Connections: Moderately Isolated (07/06/2022)   Social Connection and  Isolation Panel [NHANES]    Frequency of Communication with Friends and Family: More than three times a week    Frequency of Social Gatherings with Friends and Family: More than three times a week    Attends Religious Services: Never    Marine scientist or Organizations:  No    Attends Club or Organization Meetings: Never    Marital Status: Married  Human resources officer Violence: Not At Risk (07/06/2022)   Humiliation, Afraid, Rape, and Kick questionnaire    Fear of Current or Ex-Partner: No    Emotionally Abused: No    Physically Abused: No    Sexually Abused: No   Family History  Problem Relation Age of Onset   Heart disease Mother    Cancer Mother        breast   Stroke Father 86   Hypertension Father    Early death Sister        infant death   Heart disease Brother    Hydrocephalus Brother    Heart disease Brother    Diabetes Brother    Cancer Brother        prostat   Healthy Son    Healthy Son     Objective: Office vital signs reviewed. BP 129/65   Pulse (!) 59   Temp 98.2 F (36.8 C)   Ht _0  (1.651 m)   Wt 167 lb 6.4 oz (75.9 kg)   SpO2 94%   BMI 27.86 kg/m   Physical Examination:  General: Awake, alert, well nourished, No acute distress HEENT: Ptosis of the left eye with mild inflammation of the eyelid.  Wears glasses.  Moist mucous membranes Cardio: regular rate and rhythm, S1S2 heard, soft systolic murmur appreciated Pulm: clear to auscultation bilaterally, no wheezes, rhonchi or rales; normal work of breathing on room air Extremities: warm, well perfused, No edema, cyanosis or clubbing; +2 pulses bilaterally MSK: Ambulating with use of cane today  Diabetic Foot Exam - Simple   Simple Foot Form Diabetic Foot exam was performed with the following findings: Yes 07/08/2022 10:34 AM  Visual Inspection See comments: Yes Sensation Testing Intact to touch and monofilament testing bilaterally: Yes Pulse Check Posterior Tibialis and Dorsalis pulse  intact bilaterally: Yes Comments Onychomycotic changes to the nails bilaterally with some callus formation noted along the plantar surface of the first MTP.  He has dry, flaking plantar skin as well       07/08/2022    9:32 AM 10/29/2021    9:08 AM 11/10/2017    9:20 AM  MMSE - Mini Mental State Exam  Orientation to time _1 Orientation to Place _2 Registration _3 Attention/ Calculation 4 3 0  Attention/Calculation-comments   not attempted  Recall _4 Language- name 2 objects _5 Language- repeat _6 Language- follow 3 step command _7 Language- read & follow direction _8 Write a sentence 0 1 1  Copy design _9 Total score _10 Assessment/ Plan: 81 y.o. male   Type 2 diabetes mellitus with stage 3b chronic kidney disease, with long-term current use of insulin (HCC)  Hyperlipidemia associated with type 2 diabetes mellitus ([place]) - Plan: Bayer DCA Hb A1c Waived, CMP14+EGFR, Lipid panel  Hypertension associated with diabetes ([place])  Moderate vascular dementia with agitation ([place])  Need for immunization against influenza - Plan: Flu Vaccine QUAD High Dose(Fluad)  Sugar remains at goal.  Will CC results to Dr. Dwyane Stanley and Dr. Theador Hawthorne once available.  Encouraged him to keep appointments and a print out outlining each of these appointments was given today.  Lipid panel, CMP collected today.  Continue statin therapy  Blood pressure controlled.  No changes  MMSE showed score of 27 today.  Doing well on Namenda.  No changes  Influenza vaccination administered  Orders Placed This Encounter  Procedures   Bayer DCA Hb A1c Waived   CMP14+EGFR   Lipid panel   No orders of the defined types were placed in this encounter.    Evan Norlander, DO [place] [phone]

## 2022-07-09 ENCOUNTER — Telehealth: Payer: Self-pay | Admitting: Pharmacist

## 2022-07-09 LAB — CMP14+EGFR
ALT: 18 IU/L (ref 0–44)
AST: 16 IU/L (ref 0–40)
Albumin/Globulin Ratio: 1.8 (ref 1.2–2.2)
Albumin: 3.9 g/dL (ref 3.8–4.8)
Alkaline Phosphatase: 89 IU/L (ref 44–121)
BUN/Creatinine Ratio: 11 (ref 10–24)
BUN: 18 mg/dL (ref 8–27)
Bilirubin Total: 0.7 mg/dL (ref 0.0–1.2)
CO2: 27 mmol/L (ref 20–29)
Calcium: 9.3 mg/dL (ref 8.6–10.2)
Chloride: 103 mmol/L (ref 96–106)
Creatinine, Ser: 1.66 mg/dL — ABNORMAL HIGH (ref 0.76–1.27)
Globulin, Total: 2.2 g/dL (ref 1.5–4.5)
Glucose: 121 mg/dL — ABNORMAL HIGH (ref 70–99)
Potassium: 4.6 mmol/L (ref 3.5–5.2)
Sodium: 141 mmol/L (ref 134–144)
Total Protein: 6.1 g/dL (ref 6.0–8.5)
eGFR: 42 mL/min/{1.73_m2} — ABNORMAL LOW (ref >59)

## 2022-07-09 LAB — LIPID PANEL
Chol/HDL Ratio: 2.9 ratio (ref 0.0–5.0)
Cholesterol, Total: 114 mg/dL (ref 100–199)
HDL: 40 mg/dL (ref >39)
LDL Chol Calc (NIH): 56 mg/dL (ref 0–99)
Triglycerides: 97 mg/dL (ref 0–149)
VLDL Cholesterol Cal: 18 mg/dL (ref 5–40)

## 2022-07-09 LAB — RENAL FUNCTION PANEL: Phosphorus: 3.6 mg/dL (ref 2.8–4.1)

## 2022-07-09 LAB — BAYER DCA HB A1C WAIVED: HB A1C (BAYER DCA - WAIVED): 6.3 % — ABNORMAL HIGH (ref 4.8–5.6)

## 2022-07-09 NOTE — Telephone Encounter (Signed)
Evan Stanley #4 boxes here for pick up Patient notified

## 2022-07-16 DIAGNOSIS — D696 Thrombocytopenia, unspecified: Secondary | ICD-10-CM | POA: Diagnosis not present

## 2022-07-16 DIAGNOSIS — R809 Proteinuria, unspecified: Secondary | ICD-10-CM | POA: Diagnosis not present

## 2022-07-16 DIAGNOSIS — E1122 Type 2 diabetes mellitus with diabetic chronic kidney disease: Secondary | ICD-10-CM | POA: Diagnosis not present

## 2022-07-16 DIAGNOSIS — E1129 Type 2 diabetes mellitus with other diabetic kidney complication: Secondary | ICD-10-CM | POA: Diagnosis not present

## 2022-07-16 DIAGNOSIS — N189 Chronic kidney disease, unspecified: Secondary | ICD-10-CM | POA: Diagnosis not present

## 2022-07-16 DIAGNOSIS — I129 Hypertensive chronic kidney disease with stage 1 through stage 4 chronic kidney disease, or unspecified chronic kidney disease: Secondary | ICD-10-CM | POA: Diagnosis not present

## 2022-07-16 DIAGNOSIS — I5032 Chronic diastolic (congestive) heart failure: Secondary | ICD-10-CM | POA: Diagnosis not present

## 2022-07-24 ENCOUNTER — Other Ambulatory Visit (HOSPITAL_COMMUNITY): Payer: Self-pay

## 2022-07-27 ENCOUNTER — Telehealth: Payer: Self-pay

## 2022-07-27 NOTE — Telephone Encounter (Signed)
Received notification from [place] regarding RE-ENROLLMENT approval for TRULICITY 1.5MG /0.5ML. Patient assistance approved from 07/22/22 to 07/20/23.  Phone: [phone]

## 2022-08-04 ENCOUNTER — Telehealth: Payer: Self-pay | Admitting: Family Medicine

## 2022-08-05 ENCOUNTER — Ambulatory Visit: Payer: Medicare Other | Admitting: Endocrinology

## 2022-08-05 NOTE — Telephone Encounter (Signed)
Pt wife has been informed She has no concerns.

## 2022-08-05 NOTE — Telephone Encounter (Signed)
No, it's not ending Letter must be read carefully It is only ending for certain medications (I.e. Levemir) His medication assistance will continue

## 2022-08-20 ENCOUNTER — Telehealth: Payer: Self-pay | Admitting: Pharmacist

## 2022-08-20 NOTE — Telephone Encounter (Signed)
Evan Stanley #4 boxes of u200 Novolog #2 boxes  Patient notified BG controlled

## 2022-08-27 ENCOUNTER — Encounter: Payer: Self-pay | Admitting: Endocrinology

## 2022-08-27 ENCOUNTER — Ambulatory Visit: Payer: Medicare Other | Admitting: Endocrinology

## 2022-08-27 VITALS — BP 116/78 | HR 71 | Ht 65.0 in | Wt 168.4 lb

## 2022-08-27 DIAGNOSIS — E1122 Type 2 diabetes mellitus with diabetic chronic kidney disease: Secondary | ICD-10-CM | POA: Diagnosis not present

## 2022-08-27 DIAGNOSIS — I129 Hypertensive chronic kidney disease with stage 1 through stage 4 chronic kidney disease, or unspecified chronic kidney disease: Secondary | ICD-10-CM

## 2022-08-27 DIAGNOSIS — E1165 Type 2 diabetes mellitus with hyperglycemia: Secondary | ICD-10-CM

## 2022-08-27 DIAGNOSIS — N183 Chronic kidney disease, stage 3 unspecified: Secondary | ICD-10-CM | POA: Diagnosis not present

## 2022-08-27 DIAGNOSIS — Z794 Long term (current) use of insulin: Secondary | ICD-10-CM

## 2022-08-27 NOTE — Patient Instructions (Addendum)
Check blood sugars on waking up 3-4 days a week  Also check blood sugars about 2 hours after meals and do this after different meals by rotation  Recommended blood sugar levels on waking up are 90-130 and about 2 hours after meal is 130-160  Please bring your blood sugar monitor to each visit, thank you  Take 7 units with veggie plate, with fries take 10 units  Ask Dr Theador Hawthorne about Wilder Glade

## 2022-08-27 NOTE — Progress Notes (Signed)
Patient ID: Evan Stanley, male   DOB: 11-21-1940, 82 y.o.   MRN: [id number]          Reason for Appointment: Follow-up for Type 2 Diabetes    History of Present Illness:          Date of diagnosis of type 2 diabetes mellitus: 2010       Background history:   He thinks he was diagnosed to have diabetes after his coronary bypass surgery and blood sugars are not very high He had been on metformin until about 05/2017 Also at some point had taken glimepiride Usually appears to have had A1c just above 7%  Recent history:   INSULIN regimen is: [place] 44 units daily, Novolog 6 units at breakfast and 8 units at dinner  Non-insulin hypoglycemic drugs the patient is taking are: Trulicity 1.5 mg weekly  His A1c is 6.3, about the same  Current management, blood sugar patterns and problems identified: His meter was not reviewed on the last visit  Because of tendency to low readings overnight his [name] was reduced by 4 units on the last visit but even with this his morning sugars are only averaging 99 and no hypoglycemia Only rare hypoglycemia but not in the last 10 days with lowest reading 66 after dinner  He is feeling better than his last visit and has not lost any more weight However at breakfast he is eating 2 waffles and syrup and usually no protein like eggs He is frequently eating out in the evening but generally will have only a relatively small meal with green beans, 2 small starches and no meat Otherwise he may eat hotdogs and [place] fries sometimes Overall his blood sugars are not quite as high after dinner with only 3 readings over 180 in the last couple of weeks But has difficulty adjusting mealtime dose based on what he is eating Still refusing to consider CGM Continues on Trulicity regularly        Side effects from medications have been: None  Glucose monitoring:  done about 2 times a day         Glucometer: One Touch ultra 2.       Blood Glucose readings by review of  home monitor download show   PRE-MEAL Fasting Lunch Dinner Bedtime Overall  Glucose range: 81-116    66-212  Mean/median: 99    120   POST-MEAL PC Breakfast PC Lunch PC Dinner  Glucose range:   66-212  Mean/median:      Prior  PRE-MEAL Fasting Lunch Dinner Bedtime Overall  Glucose range: 88-120      Mean/median:        POST-MEAL PC Breakfast PC Lunch PC Dinner  Glucose range:   100-120  Mean/median:       Self-care: The diet that the patient has been following is: tries to limit sweets.     Meal times are:  Breakfast is at after 9 AM, dinner around 6 PM  Typical meal intake: Breakfast is eggs and meat and toast, sometimes waffles            Dietician visit, most recent: 2019                Weight history:  Wt Readings from Last 3 Encounters:  08/27/22 168 lb 6.4 oz (76.4 kg)  07/08/22 167 lb 6.4 oz (75.9 kg)  07/06/22 167 lb (75.8 kg)    Glycemic control:  Lab Results  Component Value Date   HGBA1C  6.3 (H) 07/08/2022   HGBA1C 6.2 (A) 04/01/2022   HGBA1C 6.6 (A) 12/18/2021   Lab Results  Component Value Date   MICROALBUR 14.4 (H) 08/12/2020   LDLCALC 56 07/08/2022   CREATININE 1.66 (H) 07/08/2022   Lab Results  Component Value Date   MICRALBCREAT 13.5 08/12/2020    Lab Results  Component Value Date   FRUCTOSAMINE 274 08/12/2020   FRUCTOSAMINE 323 (H) 04/22/2018   Lab Results  Component Value Date   HGB 15.1 03/02/2022      Allergies as of 08/27/2022       Reactions   Baclofen Other (See Comments)   Stroke like symptoms   Morphine And Related Other (See Comments)   Delirium         Medication List        Accurate as of August 27, 2022  4:42 PM. If you have any questions, ask your nurse or doctor.          acetaminophen 325 MG tablet Commonly known as: TYLENOL Take 2 tablets (650 mg total) by mouth every 6 (six) hours as needed for mild pain (or Fever >/= 101).   ascorbic acid 500 MG tablet Commonly known as: VITAMIN C Take  500 mg by mouth daily.   atorvastatin 40 MG tablet Commonly known as: LIPITOR Take 1 tablet (40 mg total) by mouth daily.   atropine 1 % ophthalmic solution Place 1 drop into the left eye 2 (two) times daily.   cetirizine 10 MG tablet Commonly known as: ZYRTEC Take 1 tablet by mouth as needed for allergies.   cholecalciferol 25 MCG (1000 UNIT) tablet Commonly known as: VITAMIN D3 Take 1,000 Units by mouth daily.   cyanocobalamin 1000 MCG tablet Take 1,000 mcg by mouth daily.   Eliquis 5 MG Tabs tablet Generic drug: apixaban TAKE 1 TABLET BY MOUTH TWICE  DAILY   hydrochlorothiazide 25 MG tablet Commonly known as: HYDRODIURIL Take 1 tablet (25 mg total) by mouth daily.   insulin aspart 100 UNIT/ML injection Commonly known as: novoLOG Inject 9 Units into the skin in the morning and at bedtime. Pt takes 9 units before breakfast, and 8 units before supper.   latanoprost 0.005 % ophthalmic solution Commonly known as: XALATAN Place 1 drop into the left eye at bedtime.   lisinopril 20 MG tablet Commonly known as: ZESTRIL Take 1 tablet (20 mg total) by mouth daily.   memantine 10 MG tablet Commonly known as: NAMENDA TAKE 1 TABLET BY MOUTH TWICE A DAY   metoprolol succinate 100 MG 24 hr tablet Commonly known as: TOPROL-XL TAKE 1 TABLET BY MOUTH  DAILY WITH OR IMMEDIATELY  FOLLOWING A MEAL   OneTouch Ultra test strip Generic drug: glucose blood CHECK BLOOD SUGAR TWICE  DAILY Dx E11.42   [name] FlexTouch 200 UNIT/ML FlexTouch Pen Generic drug: insulin degludec Inject 52 Units into the skin daily.   Trulicity 1.5 QP/5.9FM Sopn Generic drug: Dulaglutide Inject 1.5 mg into the skin once a week. Inject the contents of one pen once per week   UNABLE TO FIND Diet: NAS and consistent carbohydrate   vitamin E 1000 UNIT capsule Take 1,000 Units by mouth daily.        Allergies:  Allergies  Allergen Reactions   Baclofen Other (See Comments)    Stroke like symptoms    Morphine And Related Other (See Comments)    Delirium     Past Medical History:  Diagnosis Date   AKI (acute kidney injury) (  [place])    Allergy    Anxiety    Arthritis    Right shoulder   Atrial fibrillation (HCC)    Carotid artery stenosis    Cataract    CHF (congestive heart failure) (HCC)    CVA (cerebral vascular accident) ([place]) 04/28/2016   Decreased vision    left eye   Diabetes mellitus without complication ([place])    Takes Metformin   Hyperlipidemia    Hypertension    Salmonella bacteremia 04/29/2016   Sepsis ([place]) 04/29/2016   Stroke ([place]) 11/18/2015   no deficits   TIA (transient ischemic attack)    affected left eye   Urgency of urination     Past Surgical History:  Procedure Laterality Date   AORTIC VALVE REPLACEMENT     BACK SURGERY  80s   CARDIAC VALVE REPLACEMENT  11/01/2008   21-mm Edwards Pericardial Magna - Ease valve   ENDARTERECTOMY Right 01/13/2016   Procedure: ENDARTERECTOMY CAROTID - RIGHT;  Surgeon: Conrad Ferndale, MD;  Location: [facility];  Service: Vascular;  Laterality: Right;   EYE SURGERY     laser surgery, left eye  Left 10-29-2015    retinal surgery/ Deloria Lair MD    PERIPHERAL VASCULAR CATHETERIZATION Right 11/18/2015   Procedure: Carotid Angiography;  Surgeon: Conrad [place], MD;  Location: [facility];  Service: Cardiovascular;  Laterality: Right;   PERIPHERAL VASCULAR CATHETERIZATION N/A 11/18/2015   Procedure: Aortic Arch Angiography;  Surgeon: Conrad Sharon, MD;  Location: [facility];  Service: Cardiovascular;  Laterality: N/A;   TEE WITHOUT CARDIOVERSION N/A 05/05/2016   Procedure: TRANSESOPHAGEAL ECHOCARDIOGRAM (TEE);  Surgeon: Lelon Perla, MD;  Location[facility];  Service: Cardiovascular;  Laterality: N/A;    Family History  Problem Relation Age of Onset   Heart disease Mother    Cancer Mother        breast   Stroke Father 35   Hypertension Father    Early death Sister        infant death   Heart disease  Brother    Hydrocephalus Brother    Heart disease Brother    Diabetes Brother    Cancer Brother        prostat   Healthy Son    Healthy Son     Social History:  reports that he quit smoking about 36 years ago. His smoking use included cigarettes. He has a 40.00 pack-year smoking history. He has never used smokeless tobacco. He reports that he does not drink alcohol and does not use drugs.   Review of Systems   Lipid history: Lipids controlled well on atorvastatin 40 mg daily prescribed by his PCP   Has history of CAD and CVD    Lab Results  Component Value Date   CHOL 114 07/08/2022   HDL 40 07/08/2022   LDLCALC 56 07/08/2022   LDLDIRECT 62 02/16/2017   TRIG 97 07/08/2022   CHOLHDL 2.9 07/08/2022           Hypertension:  His treatment includes hydrochlorothiazide 25 mg and lisinopril 20 mg daily,   prescribed by PCP He does monitor BP at home   BP Readings from Last 3 Encounters:  08/27/22 116/78  07/08/22 129/65  04/01/22 128/60     CKD: Creatinine variable as follows Regularly seen by nephrologist, has not been offered any SGLT2 drugs for this Recent creatinine :  Lab Results  Component Value Date   CREATININE 1.66 (H) 07/08/2022  CREATININE 1.71 (H) 03/02/2022   CREATININE 1.91 (H) 04/04/2021          Physical Examination:  BP 116/78 (BP Location: Left Arm, Patient Position: Sitting, Cuff Size: Normal)   Pulse 71   Ht 5\' 5"  (1.651 m)   Wt 168 lb 6.4 oz (76.4 kg)   SpO2 94%   BMI 28.02 kg/m       ASSESSMENT:  Diabetes type 2, on basal bolus insulin   See history of present illness for detailed discussion of current diabetes management, blood sugar patterns and problems identified  His A1c is at 6.3 and about the same Not clear if this may be affected by his mild CKD  He is on basal bolus insulin regimen and Trulicity 1.5 mg weekly  He is doing generally fairly well with good blood sugars However he is forgetting to check his readings  after meals to help adjust his NovoLog Cannot adjust dose on his own based on what he is eating and his carbohydrate intake as well as fat intake is variable at dinnertime as well as recently not getting any protein at breakfast Minimal hypoglycemia   HYPERTENSION: Well-controlled    CKD: Stable, GFR 42    PLAN:   He will continue Tresiba at 44 units unless his morning sugars start getting lower He likely needs to add a protein to breakfast consistently especially if his 2-hour blood sugars are higher after eating Discussed the need to rotate his blood sugar monitoring times When he is eating a lighter meal such as more vegetables he can take 7 units at dinnertime but otherwise with higher fat meals or [place] fries he will take 10 units Add an egg or other protein at breakfast Discussed that he needs to consider [place] or similar drugs, explained the benefits of these drugs for long-term reduction of renal function deterioration No change in blood pressure medications  Patient Instructions  Check blood sugars on waking up 3-4 days a week  Also check blood sugars about 2 hours after meals and do this after different meals by rotation  Recommended blood sugar levels on waking up are 90-130 and about 2 hours after meal is 130-160  Please bring your blood sugar monitor to each visit, thank you  Take 7 units with veggie plate, with fries take 10 units  Ask Dr Theador Hawthorne about Wilder Glade       Evan Stanley 08/27/2022, 4:42 PM   Note: This office note was prepared with Dragon voice recognition system technology. Any transcriptional errors that result from this process are unintentional.

## 2022-08-28 NOTE — Telephone Encounter (Signed)
Received notification from [place] regarding approval for [place] . Patient assistance approved from 08/12/22 to 07/20/23.  MEDICATION WILL SHIP TO OFFICE  Phone: [phone]

## 2022-09-07 NOTE — Progress Notes (Unsigned)
[facility] [address] [place]. [place] [address] [phone]       OFFICE FOLLOW UP NOTE  [name] Date of Birth:  [birth year] Medical Record Number:  [id number]    Primary neurologist: Dr. Krista Blue Reason for visit: Memory loss, hx of prior strokes    SUBJECTIVE:   CHIEF COMPLAINT:  No chief complaint on file.   HPI:   Evan Stanley is a 82 year old male, seen in request by his primary care physician Dr. [name], Leatrice Jewels for evaluation of memory loss, gait abnormality, initial evaluation with Dr. Krista Blue on April 14, 2021   I reviewed and summarized the referring note.PMHX. HTN DM since 2010 HLD Atrial Fibrillation-on eliquis Stroke in Oct 2017, left side weakness, recovered well, was able to go back driving, golf Right endarterectomy January 13, 2016 Aortic valve replacement, Left eye, retinal artery occlusion in 2015, lost vision of left eye. Peripheral vascular disease. Smoker in the past,     Consult visit 04/14/2021 Dr. Krista Blue: He had a multiple stroke in the past, had multiple vascular risk factor, atrial fibrillation, diabetes, hypertension, hyperlipidemia, aortic valve replacement, has mild residual left-sided weakness, gait abnormality from previous stroke, already have some baseline gait abnormality  Wife noticed that patient has worsening gait abnormality memory loss since his COVID infection in August 2022, he was severely hydrated, high fever for few days, had bad congestion, achy all over, but does not require hospitalization  Since then, he was noted to have increased left-sided weakness, also developed this flailed movement of left lower extremity, sometimes involving left upper extremity, also increased confusion, memory loss,   He retired from maintenance at age 47, prior to the COVID infection in August 2022, he was still able to drive short distance to golf course, play golf, now wife is worried about leaving him alone at   house for few hours,   update Aug 11 2021 Dr. Krista Blue: he is accompanied by, wife at today's visit, since last visit in September 2022, he had made significant progress, home physical therapy has been very helpful  He continues to have significant memory loss, loss of vision of left eye due to previous left retinal artery occlusion, he likes to drive short distance to golf course, to check with his friends, but is no longer able to play, mild unsteady gait  Personally reviewed MRI of the brain in October 2022: Moderate generalized atrophy, scattered positive DWI lesion at the right frontal lobe, consistent with acute/subacute infarction, cerebral lacunar infarction in the cerebral hemisphere, pons, left cerebellar hemisphere, occlusion of left internal carotid artery, right vertebral artery, also was seen at previous MRI  He is taking Eliquis due to history of atrial fibrillation,  UPDATE 03/02/2022 JM: Patient returns for follow-up visit after prior visit with Dr. Krista Blue 7 months ago.  He is accompanied by his wife who provides majority of history.  Unfortunately, he has had some decline since prior visit especially with mood disturbance and sundowning which has worsened over the past month.  At times he can be physically aggressive or verbally abuse her.  He has poor balance which limits physical activity.  He has been advised not to drive but has still been driving short distance, his wife has not been able to take his keys away due to his aggression.  He is not currently on any medications for dementia nor medications for mood. He can have occasional agitation during the day but significantly worsened at night. Wife mentions he  has difficulty tolerating sedating medications but is interested in starting on something for his memory.   He routinely follows with PCP, endocrinology with recent A1c 6.6, cardiology and nephrology  No further concerns at this time   Update 09/08/2022 JM: Patient returns for  2-monthfollow-up.  Was started on Namenda with gradual titration to 10 mg twice daily at prior visit for cognitive impairment with mood disturbance.  Wife notes improvement of mood since initiating Namenda and cognition has been stable.       ROS:   14 system review of systems performed and negative with exception of those listed in HPI  PMH:  Past Medical History:  Diagnosis Date   AKI (acute kidney injury) ([facility]    Allergy    Anxiety    Arthritis    Right shoulder   Atrial fibrillation (HCC)    Carotid artery stenosis    Cataract    CHF (congestive heart failure) ([facility]    [facility] (cerebral vascular accident) ([facility] 04/28/2016   Decreased vision    left eye   Diabetes mellitus without complication ([facility]    Takes Metformin   Hyperlipidemia    Hypertension    Salmonella bacteremia 04/29/2016   Sepsis ([facility] 04/29/2016   Stroke ([facility] 11/18/2015   no deficits   TIA (transient ischemic attack)    affected left eye   Urgency of urination     PSH:  Past Surgical History:  Procedure Laterality Date   AORTIC VALVE REPLACEMENT     BACK SURGERY  80s   CARDIAC VALVE REPLACEMENT  11/01/2008   21-mm Edwards Pericardial Magna - Ease valve   ENDARTERECTOMY Right 01/13/2016   Procedure: ENDARTERECTOMY CAROTID - RIGHT;  Surgeon: BConrad Frackville MD;  [facility]  Service: Vascular;  Laterality: Right;   EYE SURGERY     laser surgery, left eye  Left 10-29-2015    retinal surgery/ GDeloria LairMD    PERIPHERAL VASCULAR CATHETERIZATION Right 11/18/2015   Procedure: Carotid Angiography;  Surgeon: BConrad [place] MD;  [facility];  Service: Cardiovascular;  Laterality: Right;   PERIPHERAL VASCULAR CATHETERIZATION N/A 11/18/2015   Procedure: Aortic Arch Angiography;  Surgeon: BConrad  MD;  [facility];  Service: Cardiovascular;  Laterality: N/A;   TEE WITHOUT CARDIOVERSION N/A 05/05/2016   Procedure: TRANSESOPHAGEAL ECHOCARDIOGRAM (TEE);  Surgeon: BLelon Perla MD;  [facility];  Service: Cardiovascular;  Laterality: N/A;    Social History:  Social History   Socioeconomic History   Marital status: Married    Spouse name: [name]   Number of children: 2   Years of education: 11   Highest education level: 11th grade  Occupational History   Occupation: maintenance    Employer: [employer]    Comment: Retired  Tobacco Use   Smoking status: Former    Packs/day: 2.00    Years: 20.00    Total pack years: 40.00    Types: Cigarettes    Quit date: 07/20/1986    Years since quitting: 36.1   Smokeless tobacco: Never  Vaping Use   Vaping Use: Never used  Substance and Sexual Activity   Alcohol use: No    Alcohol/week: 0.0 standard drinks of alcohol   Drug use: No   Sexual activity: Yes  Other Topics Concern   Not on file  Social History Narrative   Lives with wife in home   Right Handed   Drinks 6-7 cups caffeine daily  Social Determinants of Health   Financial Resource Strain: Low Risk  (07/06/2022)   Overall Financial Resource Strain (CARDIA)    Difficulty of Paying Living Expenses: Not hard at all  Food Insecurity: No Food Insecurity (07/06/2022)   Hunger Vital Sign    Worried About Running Out of Food in the Last Year: Never true    Ran Out of Food in the Last Year: Never true  Transportation Needs: No Transportation Needs (07/06/2022)   PRAPARE - Hydrologist (Medical): No    Lack of Transportation (Non-Medical): No  Physical Activity: Inactive (07/06/2022)   Exercise Vital Sign    Days of Exercise per Week: 0 days    Minutes of Exercise per Session: 0 min  Stress: No Stress Concern Present (07/06/2022)   [place]    Feeling of Stress : Not at all  Social Connections: Moderately Isolated (07/06/2022)   Social Connection and Isolation Panel [NHANES]    Frequency of Communication with Friends and Family: More than  three times a week    Frequency of Social Gatherings with Friends and Family: More than three times a week    Attends Religious Services: Never    Marine scientist or Organizations: No    Attends Archivist Meetings: Never    Marital Status: Married  Human resources officer Violence: Not At Risk (07/06/2022)   Humiliation, Afraid, Rape, and Kick questionnaire    Fear of Current or Ex-Partner: No    Emotionally Abused: No    Physically Abused: No    Sexually Abused: No    Family History:  Family History  Problem Relation Age of Onset   Heart disease Mother    Cancer Mother        breast   Stroke Father 16   Hypertension Father    Early death Sister        infant death   Heart disease Brother    Hydrocephalus Brother    Heart disease Brother    Diabetes Brother    Cancer Brother        prostat   Healthy Son    Healthy Son     Medications:   Current Outpatient Medications on File Prior to Visit  Medication Sig Dispense Refill   acetaminophen (TYLENOL) 325 MG tablet Take 2 tablets (650 mg total) by mouth every 6 (six) hours as needed for mild pain (or Fever >/= 101).     atorvastatin (LIPITOR) 40 MG tablet Take 1 tablet (40 mg total) by mouth daily. 100 tablet 3   atropine 1 % ophthalmic solution Place 1 drop into the left eye 2 (two) times daily. 3 mL 11   cetirizine (ZYRTEC) 10 MG tablet Take 1 tablet by mouth as needed for allergies.     cholecalciferol (VITAMIN D3) 25 MCG (1000 UNIT) tablet Take 1,000 Units by mouth daily.     cyanocobalamin 1000 MCG tablet Take 1,000 mcg by mouth daily.     Dulaglutide (TRULICITY) 1.5 0000000 SOPN Inject 1.5 mg into the skin once a week. Inject the contents of one pen once per week 6 mL 5   ELIQUIS 5 MG TABS tablet TAKE 1 TABLET BY MOUTH TWICE  DAILY 180 tablet 1   hydrochlorothiazide (HYDRODIURIL) 25 MG tablet Take 1 tablet (25 mg total) by mouth daily. 100 tablet 3   insulin aspart (NOVOLOG) 100 UNIT/ML injection Inject 9  Units into the skin  in the morning and at bedtime. Pt takes 9 units before breakfast, and 8 units before supper.     insulin degludec (TRESIBA FLEXTOUCH) 200 UNIT/ML FlexTouch Pen Inject 52 Units into the skin daily.     latanoprost (XALATAN) 0.005 % ophthalmic solution Place 1 drop into the left eye at bedtime.      lisinopril (ZESTRIL) 20 MG tablet Take 1 tablet (20 mg total) by mouth daily. 100 tablet 3   memantine (NAMENDA) 10 MG tablet TAKE 1 TABLET BY MOUTH TWICE A DAY 180 tablet 1   metoprolol succinate (TOPROL-XL) 100 MG 24 hr tablet TAKE 1 TABLET BY MOUTH  DAILY WITH OR IMMEDIATELY  FOLLOWING A MEAL 90 tablet 3   ONETOUCH ULTRA test strip CHECK BLOOD SUGAR TWICE  DAILY Dx E11.42 200 strip 3   UNABLE TO FIND Diet: NAS and consistent carbohydrate     vitamin C (ASCORBIC ACID) 500 MG tablet Take 500 mg by mouth daily.     vitamin E 1000 UNIT capsule Take 1,000 Units by mouth daily.     No current facility-administered medications on file prior to visit.    Allergies:   Allergies  Allergen Reactions   Baclofen Other (See Comments)    Stroke like symptoms   Morphine And Related Other (See Comments)    Delirium       OBJECTIVE:  Physical Exam  There were no vitals filed for this visit.  There is no height or weight on file to calculate BMI. No results found.  General: well developed, well nourished, elderly Caucasian male, seated, in no evident distress Head: head normocephalic and atraumatic.   Neck: supple with no carotid or supraclavicular bruits Cardiovascular: regular rate and rhythm, no murmurs Musculoskeletal: no deformity Skin:  no rash/petichiae Vascular:  Normal pulses all extremities   Neurologic Exam Mental Status: Awake and fully alert. Oriented to place and time. Recent and remote memory impaired. Attention span, concentration and fund of knowledge impaired. Mood and affect appropriate.  Cranial Nerves: Pupils equal, briskly reactive to light. Extraocular  movements full without nystagmus. Visual fields full to confrontation. Hearing intact. Facial sensation intact. Face, tongue, palate moves normally and symmetrically.  Motor: Normal strength on right side, increased tone LUE, mild weakness and increased tone LLE  Sensory.: intact to touch , pinprick , position and vibratory sensation.  Coordination: Rapid alternating movements normal in all extremities on right side. Finger-to-nose and heel-to-shin performed accurately on right side. Gait and Station: Arises from chair without difficulty. Stance is normal. Gait demonstrates hemiplegic gait with use of cane. Tandem walk and heel toe not attempted  Reflexes: 1+ and symmetric. Toes downgoing.      03/04/2022   11:57 AM 04/14/2021    5:00 PM  Montreal Cognitive Assessment   Visuospatial/ Executive (0/5) 3 1  Naming (0/3) 3 3  Attention: Read list of digits (0/2) 2 2  Attention: Read list of letters (0/1) 1 1  Attention: Serial 7 subtraction starting at 100 (0/3) 0 0  Language: Repeat phrase (0/2) 1 1  Language : Fluency (0/1) 1 0  Abstraction (0/2) 0 0  Delayed Recall (0/5) 0 0  Orientation (0/6) 6 5  Total 17 13        07/08/2022    9:32 AM 10/29/2021    9:08 AM 11/10/2017    9:20 AM  MMSE - Mini Mental State Exam  Orientation to time 5 5 5  $ Orientation to Place 5 5 5  $ Registration 3  3 3  Attention/ Calculation 4 3 0  Attention/Calculation-comments   not attempted  Recall 2 2 2  $ Language- name 2 objects 2 2 2  $ Language- repeat 1 1 1  $ Language- follow 3 step command 3 3 3  $ Language- read & follow direction 1 1 1  $ Write a sentence 0 1 1  Copy design 1 1 1  $ Total score 27 27 24        $ ASSESSMENT/PLAN: [place] CARABETTA is a 82 y.o. year old male   Dementia, likely mixed Alzheimer's and vascular with behavior disturbance  -Recommend trialing Namenda with gradual titration to 10 mg twice daily -discussed indication for medication to hopefully slow progression and some benefit  in regards to behaviors  -if no benefit in regards to behaviors or unable to tolerate, may consider use of Seroquel  -MoCA 17/30 (prior 13/30)  -MMSE 27/30 10/2021      -patient advised (again) against driving. He remains persistent, can undergo driving evaluation at [facility] or if he refuses and continues to drive, can complete paperwork to revoke license              -advised to call 911 immediately if patient becomes a threat to himself or others   Stroke Presented with Worsening gait abnormality Since COVID infection in August 2022             Multiple strokes in the past and multiple vascular risk factors including hypertension, hyperlipidemia, diabetes, atrial fibrillation, history of stroke, history of right internal carotid artery endarterectomy, chronic left ICA and VA occlusion, peripheral vascular disease, aortic valve replacement, previous smoker, aging,             MRI of the brain 04/2021 showed acute to subacute stroke involving right frontal subcortical region, worsening moderate generalized atrophy, moderate supratentorium small vessel disease, multiple lacunar infarction, evidence of occlusion of left internal carotid artery and right vertebral artery that was present at previous MRI            Continue Eliquis 5 mg twice a day and atorvastatin 40 mg daily -manage/prescribed by PCP/cardiology             Emphasized importance of increasing daily activity/exercise, compliant with his medications, increase water intake and routine follow-up with PCP/other specialties for stroke risk factor management      Follow up in 6 months or call earlier if needed   CC:  PCP: [name], DO    I spent 34 minutes of face-to-face and non-face-to-face time with patient and wife.  This included previsit chart review, lab review, study review, order entry, electronic health record documentation, patient education regarding above diagnosis, review and discussion of MOCA, cognitive decline  and further treatment options and answered all other questions to patients satisfaction    Frann Rider, [facility]  [facility] [address] [place], [place] [address]  Phone [phone] Fax [phone] Note: This document was prepared with digital dictation and possible smart phrase technology. Any transcriptional errors that result from this process are unintentional.

## 2022-09-08 ENCOUNTER — Encounter: Payer: Self-pay | Admitting: Adult Health

## 2022-09-08 ENCOUNTER — Ambulatory Visit: Payer: Medicare Other | Admitting: Adult Health

## 2022-09-08 VITALS — BP 138/78 | HR 56 | Ht 65.0 in | Wt 169.0 lb

## 2022-09-08 DIAGNOSIS — F01518 Vascular dementia, unspecified severity, with other behavioral disturbance: Secondary | ICD-10-CM

## 2022-09-08 DIAGNOSIS — F02818 Dementia in other diseases classified elsewhere, unspecified severity, with other behavioral disturbance: Secondary | ICD-10-CM

## 2022-09-08 DIAGNOSIS — Z8673 Personal history of transient ischemic attack (TIA), and cerebral infarction without residual deficits: Secondary | ICD-10-CM

## 2022-09-08 DIAGNOSIS — G309 Alzheimer's disease, unspecified: Secondary | ICD-10-CM

## 2022-09-08 MED ORDER — MEMANTINE HCL 10 MG PO TABS: 10.0000 mg | ORAL_TABLET | Freq: Two times a day (BID) | ORAL | 3 refills | Status: DC

## 2022-09-08 NOTE — Patient Instructions (Signed)
Please do therapies at home. If you would like to proceed with therapies at home, please let me know. Continue to use cane at all times at home.   Continue Namenda 45m twice daily for memory    Follow up in 6 months or call earlier if needed

## 2022-09-28 ENCOUNTER — Other Ambulatory Visit: Payer: Self-pay | Admitting: Family Medicine

## 2022-09-30 DIAGNOSIS — H4089 Other specified glaucoma: Secondary | ICD-10-CM | POA: Diagnosis not present

## 2022-09-30 DIAGNOSIS — E119 Type 2 diabetes mellitus without complications: Secondary | ICD-10-CM | POA: Diagnosis not present

## 2022-09-30 DIAGNOSIS — Z961 Presence of intraocular lens: Secondary | ICD-10-CM | POA: Diagnosis not present

## 2022-09-30 DIAGNOSIS — Z794 Long term (current) use of insulin: Secondary | ICD-10-CM | POA: Diagnosis not present

## 2022-09-30 DIAGNOSIS — H1812 Bullous keratopathy, left eye: Secondary | ICD-10-CM | POA: Diagnosis not present

## 2022-09-30 DIAGNOSIS — H2511 Age-related nuclear cataract, right eye: Secondary | ICD-10-CM | POA: Diagnosis not present

## 2022-09-30 LAB — HM DIABETES EYE EXAM

## 2022-10-01 DIAGNOSIS — L84 Corns and callosities: Secondary | ICD-10-CM | POA: Diagnosis not present

## 2022-10-01 DIAGNOSIS — M79676 Pain in unspecified toe(s): Secondary | ICD-10-CM | POA: Diagnosis not present

## 2022-10-01 DIAGNOSIS — E1142 Type 2 diabetes mellitus with diabetic polyneuropathy: Secondary | ICD-10-CM | POA: Diagnosis not present

## 2022-10-01 DIAGNOSIS — B351 Tinea unguium: Secondary | ICD-10-CM | POA: Diagnosis not present

## 2022-10-15 ENCOUNTER — Other Ambulatory Visit: Payer: Medicare Other

## 2022-10-15 DIAGNOSIS — I129 Hypertensive chronic kidney disease with stage 1 through stage 4 chronic kidney disease, or unspecified chronic kidney disease: Secondary | ICD-10-CM | POA: Diagnosis not present

## 2022-10-15 DIAGNOSIS — E1122 Type 2 diabetes mellitus with diabetic chronic kidney disease: Secondary | ICD-10-CM | POA: Diagnosis not present

## 2022-10-15 DIAGNOSIS — N189 Chronic kidney disease, unspecified: Secondary | ICD-10-CM | POA: Diagnosis not present

## 2022-10-15 DIAGNOSIS — R809 Proteinuria, unspecified: Secondary | ICD-10-CM | POA: Diagnosis not present

## 2022-10-15 DIAGNOSIS — E1129 Type 2 diabetes mellitus with other diabetic kidney complication: Secondary | ICD-10-CM | POA: Diagnosis not present

## 2022-10-22 DIAGNOSIS — E1129 Type 2 diabetes mellitus with other diabetic kidney complication: Secondary | ICD-10-CM | POA: Diagnosis not present

## 2022-10-22 DIAGNOSIS — I129 Hypertensive chronic kidney disease with stage 1 through stage 4 chronic kidney disease, or unspecified chronic kidney disease: Secondary | ICD-10-CM | POA: Diagnosis not present

## 2022-10-22 DIAGNOSIS — D696 Thrombocytopenia, unspecified: Secondary | ICD-10-CM | POA: Diagnosis not present

## 2022-10-22 DIAGNOSIS — R809 Proteinuria, unspecified: Secondary | ICD-10-CM | POA: Diagnosis not present

## 2022-10-22 DIAGNOSIS — I5032 Chronic diastolic (congestive) heart failure: Secondary | ICD-10-CM | POA: Diagnosis not present

## 2022-10-22 DIAGNOSIS — E870 Hyperosmolality and hypernatremia: Secondary | ICD-10-CM | POA: Diagnosis not present

## 2022-10-22 DIAGNOSIS — N189 Chronic kidney disease, unspecified: Secondary | ICD-10-CM | POA: Diagnosis not present

## 2022-10-22 DIAGNOSIS — E1122 Type 2 diabetes mellitus with diabetic chronic kidney disease: Secondary | ICD-10-CM | POA: Diagnosis not present

## 2022-11-18 ENCOUNTER — Ambulatory Visit (HOSPITAL_COMMUNITY): Payer: Medicare Other | Attending: Interventional Cardiology

## 2022-11-18 DIAGNOSIS — Z953 Presence of xenogenic heart valve: Secondary | ICD-10-CM | POA: Diagnosis not present

## 2022-11-18 MED ORDER — PERFLUTREN LIPID MICROSPHERE
1.0000 mL | INTRAVENOUS | Status: AC | PRN
  Administered 2022-11-18: 2 mL via INTRAVENOUS

## 2022-11-19 ENCOUNTER — Ambulatory Visit (HOSPITAL_COMMUNITY): Payer: Medicare Other

## 2022-11-19 LAB — ECHOCARDIOGRAM COMPLETE
AV Mean grad: 13 mmHg
AV Peak grad: 24.6 mmHg
Ao pk vel: 2.48 m/s
Area-P 1/2: 3.51 cm2
S' Lateral: 2.8 cm

## 2022-11-26 ENCOUNTER — Telehealth: Payer: Self-pay

## 2022-11-26 NOTE — Telephone Encounter (Signed)
Patient informed we have received medication and it will be placed in refrigerator for him to pick up.

## 2022-11-30 ENCOUNTER — Encounter (INDEPENDENT_AMBULATORY_CARE_PROVIDER_SITE_OTHER): Payer: Medicare Other | Admitting: Ophthalmology

## 2022-12-02 DIAGNOSIS — H3412 Central retinal artery occlusion, left eye: Secondary | ICD-10-CM | POA: Diagnosis not present

## 2022-12-02 DIAGNOSIS — H18232 Secondary corneal edema, left eye: Secondary | ICD-10-CM | POA: Diagnosis not present

## 2022-12-02 DIAGNOSIS — H35031 Hypertensive retinopathy, right eye: Secondary | ICD-10-CM | POA: Diagnosis not present

## 2022-12-02 DIAGNOSIS — H43811 Vitreous degeneration, right eye: Secondary | ICD-10-CM | POA: Diagnosis not present

## 2022-12-16 NOTE — Progress Notes (Unsigned)
Cardiology Office Note:    Date:  12/17/2022   ID:  AUM TEER, DOB 07/06/1941, MRN [id number]  PCP:  Raliegh Ip, DO  Cardiologist:  None   Referring MD: Raliegh Ip, DO   F/U of CAD    History of Present Illness:    Evan Stanley is a 82 y.o. male with a hx of bioprosthetic aortic valve 2010, hyperlipidemia, coronary artery disease with prior bypass grafting 2010, left ventricular systolic dysfunction, right cerebral CVA (2017), total occlusion left ICA, RCEA, and  DM type II.  The pt was last seein in clinic by Mendel Ryder in May 2023   Since seen he just had an echo done on Nov 23 2022  LVEF and RVEF nl  AV prosthesis working well  He denies CP  Breathing is OK    Since strokes he  is unsteady, has balance problems.  This l_imits activity   Gets to golf course everyday but lmits play    Pt followed by Drs [place] (renal), Lucianne Muss (endo), [place] (eyes) along with Dr Willette Alma  Past Medical History:  Diagnosis Date   AKI (acute kidney injury) [facility])    Allergy    Anxiety    Arthritis    Right shoulder   Atrial fibrillation (HCC)    Carotid artery stenosis    Cataract    CHF (congestive heart failure) (HCC)    CVA (cerebral vascular accident) (HCC) 04/28/2016   Decreased vision    left eye   Diabetes mellitus without complication (HCC)    Takes Metformin   Hyperlipidemia    Hypertension    Salmonella bacteremia 04/29/2016   Sepsis (HCC) 04/29/2016   Stroke (HCC) 11/18/2015   no deficits   TIA (transient ischemic attack)    affected left eye   Urgency of urination     Past Surgical History:  Procedure Laterality Date   AORTIC VALVE REPLACEMENT     BACK SURGERY  80s   CARDIAC VALVE REPLACEMENT  11/01/2008   21-mm Edwards Pericardial Magna - Ease valve   ENDARTERECTOMY Right 01/13/2016   Procedure: ENDARTERECTOMY CAROTID - RIGHT;  Surgeon: Fransisco Hertz, MD;  Location: [facility] OR;  Service: Vascular;  Laterality: Right;   EYE SURGERY     laser  surgery, left eye  Left 10-29-2015    retinal surgery/ Fawn Kirk MD    PERIPHERAL VASCULAR CATHETERIZATION Right 11/18/2015   Procedure: Carotid Angiography;  Surgeon: Fransisco Hertz, MD;  Location: [facility] INVASIVE CV LAB;  Service: Cardiovascular;  Laterality: Right;   PERIPHERAL VASCULAR CATHETERIZATION N/A 11/18/2015   Procedure: Aortic Arch Angiography;  Surgeon: Fransisco Hertz, MD;  Location: [facility] INVASIVE CV LAB;  Service: Cardiovascular;  Laterality: N/A;   TEE WITHOUT CARDIOVERSION N/A 05/05/2016   Procedure: TRANSESOPHAGEAL ECHOCARDIOGRAM (TEE);  Surgeon: Lewayne Bunting, MD;  Location: [facility] ENDOSCOPY;  Service: Cardiovascular;  Laterality: N/A;    Current Medications: Current Meds  Medication Sig   acetaminophen (TYLENOL) 325 MG tablet Take 2 tablets (650 mg total) by mouth every 6 (six) hours as needed for mild pain (or Fever >/= 101).   apixaban (ELIQUIS) 5 MG TABS tablet TAKE 1 TABLET BY MOUTH TWICE  DAILY   atorvastatin (LIPITOR) 40 MG tablet Take 1 tablet (40 mg total) by mouth daily.   atropine 1 % ophthalmic solution Place 1 drop into the left eye 2 (two) times daily.   cetirizine (ZYRTEC) 10 MG tablet Take 1 tablet by mouth as needed for  allergies.   cholecalciferol (VITAMIN D3) 25 MCG (1000 UNIT) tablet Take 1,000 Units by mouth daily.   cyanocobalamin 1000 MCG tablet Take 1,000 mcg by mouth daily.   Dulaglutide (TRULICITY) 1.5 MG/0.5ML SOPN Inject 1.5 mg into the skin once a week. Inject the contents of one pen once per week   hydrochlorothiazide (HYDRODIURIL) 25 MG tablet Take 1 tablet (25 mg total) by mouth daily.   insulin aspart (NOVOLOG) 100 UNIT/ML injection Inject 9 Units into the skin in the morning and at bedtime. Pt takes 9 units before breakfast, and 8 units before supper.   insulin degludec (TRESIBA FLEXTOUCH) 200 UNIT/ML FlexTouch Pen Inject 52 Units into the skin daily.   latanoprost (XALATAN) 0.005 % ophthalmic solution Place 1 drop into the left eye at bedtime.     lisinopril (ZESTRIL) 20 MG tablet Take 1 tablet (20 mg total) by mouth daily.   memantine (NAMENDA) 10 MG tablet Take 1 tablet (10 mg total) by mouth 2 (two) times daily.   metoprolol succinate (TOPROL-XL) 100 MG 24 hr tablet TAKE 1 TABLET BY MOUTH  DAILY WITH OR IMMEDIATELY  FOLLOWING A MEAL   ONETOUCH ULTRA test strip CHECK BLOOD SUGAR TWICE  DAILY Dx E11.42   UNABLE TO FIND Diet: NAS and consistent carbohydrate     Allergies:   Baclofen and Morphine and codeine   Social History   Socioeconomic History   Marital status: Married    Spouse name: [name]   Number of children: 2   Years of education: 11   Highest education level: 11th grade  Occupational History   Occupation: maintenance    Employer: [employer]    Comment: Retired  Tobacco Use   Smoking status: Former    Packs/day: 2.00    Years: 20.00    Additional pack years: 0.00    Total pack years: 40.00    Types: Cigarettes    Quit date: 07/20/1986    Years since quitting: 36.4   Smokeless tobacco: Never  Vaping Use   Vaping Use: Never used  Substance and Sexual Activity   Alcohol use: No    Alcohol/week: 0.0 standard drinks of alcohol   Drug use: No   Sexual activity: Yes  Other Topics Concern   Not on file  Social History Narrative   Lives with wife in home   Right Handed   Drinks 6-7 cups caffeine daily   Social Determinants of Health   Financial Resource Strain: Low Risk  (07/06/2022)   Overall Financial Resource Strain (CARDIA)    Difficulty of Paying Living Expenses: Not hard at all  Food Insecurity: No Food Insecurity (07/06/2022)   Hunger Vital Sign    Worried About Running Out of Food in the Last Year: Never true    Ran Out of Food in the Last Year: Never true  Transportation Needs: No Transportation Needs (07/06/2022)   PRAPARE - Administrator, Civil Service (Medical): No    Lack of Transportation (Non-Medical): No  Physical Activity: Inactive (07/06/2022)   Exercise Vital Sign    Days of  Exercise per Week: 0 days    Minutes of Exercise per Session: 0 min  Stress: No Stress Concern Present (07/06/2022)   [employer] of Occupational Health - Occupational Stress Questionnaire    Feeling of Stress : Not at all  Social Connections: Moderately Isolated (07/06/2022)   Social Connection and Isolation Panel [NHANES]    Frequency of Communication with Friends and Family: More than three times  a week    Frequency of Social Gatherings with Friends and Family: More than three times a week    Attends Religious Services: Never    Database administrator or Organizations: No    Attends Engineer, structural: Never    Marital Status: Married     Family History: The patient's family history includes Cancer in his brother and mother; Diabetes in his brother; Early death in his sister; Healthy in his son and son; Heart disease in his brother, brother, and mother; Hydrocephalus in his brother; Hypertension in his father; Stroke (age of onset: 31) in his father.  ROS:   Please see the history of present illness.    Difficulty with balance prevents him from golfing which is been one of his favorite activities.  Decreased memory according to the wife.  This is very troubling to her.  Dr. Terrace Arabia is the neurologist.  Duddy and characterizes the patient's dementia and she feels it is degenerative and related to brain volume loss and vascular component.  She recommended discontinuation of driving.  Nephrology is Dr. Wolfgang Phoenix who states that the patient is stable.  All other systems reviewed and are negative.  EKGs/Labs/Other Studies Reviewed:    The following studies were reviewed today:  Echo May 2024   1. Left ventricular ejection fraction, by estimation, is 60 to 65%. The  left ventricle has normal function. The left ventricle has no regional  wall motion abnormalities. There is mild left ventricular hypertrophy.  Left ventricular diastolic parameters  are consistent with Grade II  diastolic dysfunction (pseudonormalization).  Elevated left atrial pressure.   2. Right ventricular systolic function is normal. The right ventricular  size is normal. Tricuspid regurgitation signal is inadequate for assessing  PA pressure.   3. The mitral valve is degenerative. Trivial mitral valve regurgitation.  No evidence of mitral stenosis. Moderate mitral annular calcification.   4. The aortic valve has been repaired/replaced. Aortic valve  regurgitation is not visualized. There is a 23 mm bioprosthetic valve  present in the aortic position. Echo findings are consistent with normal  structure and function of the aortic valve  prosthesis. Aortic valve mean gradient measures 13.0 mmHg.   5. The inferior vena cava is normal in size with greater than 50%  respiratory variability, suggesting right atrial pressure of 3 mmHg.   2D Doppler echocardiogram 2021: IMPRESSIONS   1. Left ventricular ejection fraction, by estimation, is 65 to 70%. The  left ventricle has normal function. The left ventricle has no regional  wall motion abnormalities. There is moderate left ventricular hypertrophy.  Left ventricular diastolic  parameters are consistent with Grade I diastolic dysfunction (impaired  relaxation). Elevated left ventricular end-diastolic pressure.   2. Right ventricular systolic function is low normal. The right  ventricular size is normal.   3. The mitral valve is abnormal. Trivial mitral valve regurgitation.   4. The aortic valve has been repaired/replaced with a bioprosthetic  valve. Aortic valve regurgitation is not visualized. Aortic valve area, by  VTI measures 2.69 cm. Aortic valve mean gradient measures 8.0 mmHg. Peak  gradient is 13.5 mmHg. Aortic valve  Vmax measures 1.84 m/s.   Comparison(s): 05/15/2019: LVEF 60-65%, aortic valve mean gradient 13  mmHg, peak gradient 24 mmHg.    EKG:nSR 64 bpm  Nonspecific ST changes   with ST depreesion inferolaterally   (no  significant change from previous)  Recent Labs: 03/02/2022: Hemoglobin 15.1; Magnesium 1.9; Platelets 143; TSH 1.430 07/08/2022: ALT 18;  BUN 18; Creatinine, Ser 1.66; Potassium 4.6; Sodium 141  Recent Lipid Panel    Component Value Date/Time   CHOL 114 07/08/2022 0910   TRIG 97 07/08/2022 0910   HDL 40 07/08/2022 0910   CHOLHDL 2.9 07/08/2022 0910   CHOLHDL 3.0 12/25/2019 0257   VLDL 12 12/25/2019 0257   LDLCALC 56 07/08/2022 0910   LDLDIRECT 62 02/16/2017 1151    Physical Exam:    VS:  BP 110/70   Pulse 72   Ht 5\' 5"  (1.651 m)   Wt 171 lb 9.6 oz (77.8 kg)   SpO2 97%   BMI 28.56 kg/m     Wt Readings from Last 3 Encounters:  12/17/22 171 lb 9.6 oz (77.8 kg)  09/08/22 169 lb (76.7 kg)  08/27/22 168 lb 6.4 oz (76.4 kg)     GEN: Pt is in no acute distress NECK: JVP is normal   . CARDIAC: RRR  Gr 1 to 2/6 systolic murmur  No LE edema VASCULAR:  Normal Pulses. No bruits. RESPIRATORY:  Clear to auscultation ABDOMEN: Soft, non-tender, non-distended,   ASSESSMENT:    1  CAD   s/p CABG in 2010    Myoview in 2017   Prior MI  periinfarct ischemia   LVEF 63%    2  AV  dz.  S/p AVR 2010   Last echo prosthesis functioning well  3  Hx of HFrEFRecent echo LVEF and RVEF normal     4  Atrial fibrillation   Keep on Eliquis  5   Hx CV dz   CVA in 2017 100% LICA,  s/p R CEA   6  Hxl HL    LDL in Dec 2023 was 56  HDL 40  Trig 97    5  CKD  Follows in renal clinic with Dr [place]  Cr 1.66   6  DM  Follows with Dr Lucianne Muss   last A1C was 6.4      Medication Adjustments/Labs and Tests Ordered: Current medicines are reviewed at length with the patient today.  Concerns regarding medicines are outlined above.  Orders Placed This Encounter  Procedures   EKG 12-Lead   No orders of the defined types were placed in this encounter.    Dietrich Pates, MD  12/17/2022 11:36 AM    [facility]

## 2022-12-17 ENCOUNTER — Ambulatory Visit: Payer: Medicare Other | Attending: Internal Medicine | Admitting: Internal Medicine

## 2022-12-17 VITALS — BP 110/70 | HR 72 | Ht 65.0 in | Wt 171.6 lb

## 2022-12-17 DIAGNOSIS — I2510 Atherosclerotic heart disease of native coronary artery without angina pectoris: Secondary | ICD-10-CM

## 2022-12-17 NOTE — Patient Instructions (Signed)
Medication Instructions:   *If you need a refill on your cardiac medications before your next appointment, please call your pharmacy*   Lab Work:  If you have labs (blood work) drawn today and your tests are completely normal, you will receive your results only by: MyChart Message (if you have MyChart) OR A paper copy in the mail If you have any lab test that is abnormal or we need to change your treatment, we will call you to review the results.   Testing/Procedures:    Follow-Up: SPRING 2025  At [facility], you and your health needs are our priority.  As part of our continuing mission to provide you with exceptional heart care, we have created designated Provider Care Teams.  These Care Teams include your primary Cardiologist (physician) and Advanced Practice Providers (APPs -  Physician Assistants and Nurse Practitioners) who all work together to provide you with the care you need, when you need it.  We recommend signing up for the patient portal called "MyChart".  Sign up information is provided on this After Visit Summary.  MyChart is used to connect with patients for Virtual Visits (Telemedicine).  Patients are able to view lab/test results, encounter notes, upcoming appointments, etc.  Non-urgent messages can be sent to your provider as well.   To learn more about what you can do with MyChart, go to [email or web address].    Dr Dietrich Pates     Other Instructions

## 2022-12-28 ENCOUNTER — Other Ambulatory Visit: Payer: Self-pay | Admitting: Endocrinology

## 2022-12-28 DIAGNOSIS — E1165 Type 2 diabetes mellitus with hyperglycemia: Secondary | ICD-10-CM

## 2022-12-30 ENCOUNTER — Ambulatory Visit: Payer: Medicare Other | Admitting: Endocrinology

## 2022-12-30 ENCOUNTER — Other Ambulatory Visit: Payer: Medicare Other

## 2022-12-30 VITALS — BP 121/65 | HR 59 | Ht 65.0 in | Wt 174.2 lb

## 2022-12-30 DIAGNOSIS — E1165 Type 2 diabetes mellitus with hyperglycemia: Secondary | ICD-10-CM

## 2022-12-30 DIAGNOSIS — Z794 Long term (current) use of insulin: Secondary | ICD-10-CM | POA: Diagnosis not present

## 2022-12-30 DIAGNOSIS — E1122 Type 2 diabetes mellitus with diabetic chronic kidney disease: Secondary | ICD-10-CM

## 2022-12-30 DIAGNOSIS — N183 Chronic kidney disease, stage 3 unspecified: Secondary | ICD-10-CM | POA: Diagnosis not present

## 2022-12-30 DIAGNOSIS — I129 Hypertensive chronic kidney disease with stage 1 through stage 4 chronic kidney disease, or unspecified chronic kidney disease: Secondary | ICD-10-CM | POA: Diagnosis not present

## 2022-12-30 LAB — POCT GLYCOSYLATED HEMOGLOBIN (HGB A1C): Hemoglobin A1C: 7 % — AB (ref 4.0–5.6)

## 2022-12-30 MED ORDER — ONETOUCH VERIO VI STRP: ORAL_STRIP | 12 refills | Status: DC

## 2022-12-30 MED ORDER — ONETOUCH VERIO W/DEVICE KIT: PACK | 0 refills | Status: AC

## 2022-12-30 NOTE — Progress Notes (Signed)
Patient ID: Evan Stanley, male   DOB: 1941/04/03, 82 y.o.   MRN: [id number]          Reason for Appointment: Follow-up for Type 2 Diabetes    History of Present Illness:          Date of diagnosis of type 2 diabetes mellitus: 2010       Background history:   He thinks he was diagnosed to have diabetes after his coronary bypass surgery and blood sugars are not very high He had been on metformin until about 05/2017 Also at some point had taken glimepiride Usually appears to have had A1c just above 7%  Recent history:   INSULIN regimen is: [place] 44 units daily, Novolog 8-9 units at breakfast and 8-9 units at dinner  Non-insulin hypoglycemic drugs the patient is taking are: Trulicity 1.5 mg weekly  His A1c is 7.0, previously lower   Current management, blood sugar patterns and problems identified: His meter was not downloaded today because of software issues with his old One Touch ultra meter Despite consistently asking to check readings at different times of the day including after breakfast he is only checking them in the morning on waking up in about 2 hours after dinner Overall his blood sugars are fairly well-controlled but not clear why his A1c is higher He takes the same insulin dose usually at breakfast and dinner and only adjusted by 1 unit When he is eating a relatively smaller meal with more vegetables and low-fat his blood sugars after dinner are only in the low 100 range Occasionally with higher fat meals his blood sugars with supper are generally about 180 and only occasionally higher Highest reading 288 with missing his NovoLog Not consistent with adding protein to breakfast He has refused to consider CGM He has been able to stay on Trulicity regularly with the patient assistance program and no interruption in his supply        Side effects from medications have been: None  Glucose monitoring:  done about 2 times a day         Glucometer: One Touch ultra 2.        Blood Glucose readings by review of home monitor review show the following range   PRE-MEAL Fasting Lunch Dinner Bedtime Overall  Glucose range: 94-130      Mean/median:     141   POST-MEAL PC Breakfast PC Lunch PC Dinner  Glucose range:   106-288  Mean/median:      Previously:  PRE-MEAL Fasting Lunch Dinner Bedtime Overall  Glucose range: 81-116    66-212  Mean/median: 99    120   POST-MEAL PC Breakfast PC Lunch PC Dinner  Glucose range:   66-212  Mean/median:        Self-care: The diet that the patient has been following is: tries to limit sweets.     Meal times are:  Breakfast is at after 9 AM, dinner around 6 PM  Typical meal intake: Breakfast is eggs and meat and toast, sometimes waffles            Dietician visit, most recent: 2019                Weight history:  Wt Readings from Last 3 Encounters:  12/30/22 174 lb 3.2 oz (79 kg)  12/17/22 171 lb 9.6 oz (77.8 kg)  09/08/22 169 lb (76.7 kg)    Glycemic control:  Lab Results  Component Value Date   HGBA1C 7.0 (  A) 12/30/2022   HGBA1C 6.3 (H) 07/08/2022   HGBA1C 6.2 (A) 04/01/2022   Lab Results  Component Value Date   MICROALBUR 14.4 (H) 08/12/2020   LDLCALC 56 07/08/2022   CREATININE 1.66 (H) 07/08/2022   Lab Results  Component Value Date   MICRALBCREAT 13.5 08/12/2020    Lab Results  Component Value Date   FRUCTOSAMINE 274 08/12/2020   FRUCTOSAMINE 323 (H) 04/22/2018   Lab Results  Component Value Date   HGB 15.1 03/02/2022      Allergies as of 12/30/2022       Reactions   Baclofen Other (See Comments)   Stroke like symptoms   Morphine And Codeine Other (See Comments)   Delirium         Medication List        Accurate as of December 30, 2022 11:36 AM. If you have any questions, ask your nurse or doctor.          acetaminophen 325 MG tablet Commonly known as: TYLENOL Take 2 tablets (650 mg total) by mouth every 6 (six) hours as needed for mild pain (or Fever >/= 101).    atorvastatin 40 MG tablet Commonly known as: LIPITOR Take 1 tablet (40 mg total) by mouth daily.   atropine 1 % ophthalmic solution Place 1 drop into the left eye 2 (two) times daily.   cetirizine 10 MG tablet Commonly known as: ZYRTEC Take 1 tablet by mouth as needed for allergies.   cholecalciferol 25 MCG (1000 UNIT) tablet Commonly known as: VITAMIN D3 Take 1,000 Units by mouth daily.   cyanocobalamin 1000 MCG tablet Take 1,000 mcg by mouth daily.   Eliquis 5 MG Tabs tablet Generic drug: apixaban TAKE 1 TABLET BY MOUTH TWICE  DAILY   hydrochlorothiazide 25 MG tablet Commonly known as: HYDRODIURIL Take 1 tablet (25 mg total) by mouth daily.   insulin aspart 100 UNIT/ML injection Commonly known as: novoLOG Inject 9 Units into the skin in the morning and at bedtime. Pt takes 9 units before breakfast, and 8 units before supper.   latanoprost 0.005 % ophthalmic solution Commonly known as: XALATAN Place 1 drop into the left eye at bedtime.   lisinopril 20 MG tablet Commonly known as: ZESTRIL Take 1 tablet (20 mg total) by mouth daily.   memantine 10 MG tablet Commonly known as: NAMENDA Take 1 tablet (10 mg total) by mouth 2 (two) times daily.   metoprolol succinate 100 MG 24 hr tablet Commonly known as: TOPROL-XL TAKE 1 TABLET BY MOUTH  DAILY WITH OR IMMEDIATELY  FOLLOWING A MEAL   OneTouch Ultra test strip Generic drug: glucose blood CHECK BLOOD SUGAR TWICE  DAILY Dx E11.42   Evaristo Bury FlexTouch 200 UNIT/ML FlexTouch Pen Generic drug: insulin degludec Inject 52 Units into the skin daily.   Trulicity 1.5 MG/0.5ML Sopn Generic drug: Dulaglutide Inject 1.5 mg into the skin once a week. Inject the contents of one pen once per week   UNABLE TO FIND Diet: NAS and consistent carbohydrate        Allergies:  Allergies  Allergen Reactions   Baclofen Other (See Comments)    Stroke like symptoms   Morphine And Codeine Other (See Comments)    Delirium      Past Medical History:  Diagnosis Date   AKI (acute kidney injury) (HCC)    Allergy    Anxiety    Arthritis    Right shoulder   Atrial fibrillation (HCC)    Carotid artery stenosis  Cataract    CHF (congestive heart failure) (HCC)    CVA (cerebral vascular accident) (HCC) 04/28/2016   Decreased vision    left eye   Diabetes mellitus without complication (HCC)    Takes Metformin   Hyperlipidemia    Hypertension    Salmonella bacteremia 04/29/2016   Sepsis (HCC) 04/29/2016   Stroke (HCC) 11/18/2015   no deficits   TIA (transient ischemic attack)    affected left eye   Urgency of urination     Past Surgical History:  Procedure Laterality Date   AORTIC VALVE REPLACEMENT     BACK SURGERY  80s   CARDIAC VALVE REPLACEMENT  11/01/2008   21-mm Edwards Pericardial Magna - Ease valve   ENDARTERECTOMY Right 01/13/2016   Procedure: ENDARTERECTOMY CAROTID - RIGHT;  Surgeon: Fransisco Hertz, MD;  Location: [facility];  Service: Vascular;  Laterality: Right;   EYE SURGERY     laser surgery, left eye  Left 10-29-2015    retinal surgery/ Fawn Kirk MD    PERIPHERAL VASCULAR CATHETERIZATION Right 11/18/2015   Procedure: Carotid Angiography;  Surgeon: Fransisco Hertz, MD;  Location: [facility] INVASIVE CV LAB;  Service: Cardiovascular;  Laterality: Right;   PERIPHERAL VASCULAR CATHETERIZATION N/A 11/18/2015   Procedure: Aortic Arch Angiography;  Surgeon: Fransisco Hertz, MD;  Location: [facility] INVASIVE CV LAB;  Service: Cardiovascular;  Laterality: N/A;   TEE WITHOUT CARDIOVERSION N/A 05/05/2016   Procedure: TRANSESOPHAGEAL ECHOCARDIOGRAM (TEE);  Surgeon: Lewayne Bunting, MD;  Location[facility] ENDOSCOPY;  Service: Cardiovascular;  Laterality: N/A;    Family History  Problem Relation Age of Onset   Heart disease Mother    Cancer Mother        breast   Stroke Father 29   Hypertension Father    Early death Sister        infant death   Heart disease Brother    Hydrocephalus Brother    Heart disease Brother     Diabetes Brother    Cancer Brother        prostat   Healthy Son    Healthy Son     Social History:  reports that he quit smoking about 36 years ago. His smoking use included cigarettes. He has a 40.00 pack-year smoking history. He has never used smokeless tobacco. He reports that he does not drink alcohol and does not use drugs.   Review of Systems   Lipid history: Lipids controlled well on atorvastatin 40 mg daily prescribed by his PCP   Has history of CAD and CVD    Lab Results  Component Value Date   CHOL 114 07/08/2022   HDL 40 07/08/2022   LDLCALC 56 07/08/2022   LDLDIRECT 62 02/16/2017   TRIG 97 07/08/2022   CHOLHDL 2.9 07/08/2022           Hypertension:  His treatment includes hydrochlorothiazide 25 mg and lisinopril 20 mg daily,   prescribed by PCP He does monitor BP at home   BP Readings from Last 3 Encounters:  12/30/22 121/65  12/17/22 110/70  09/08/22 138/78     CKD: Creatinine variable as follows Regularly seen by nephrologist, has still not been offered any SGLT2 drugs for this  Creatinine as of 09/2022 was 1.47 Last urine protein 956  Lab Results  Component Value Date   CREATININE 1.66 (H) 07/08/2022   CREATININE 1.71 (H) 03/02/2022   CREATININE 1.91 (H) 04/04/2021      Physical Examination:  BP 121/65 (BP Location: Right  Arm, Patient Position: Sitting, Cuff Size: Normal)   Pulse (!) 59   Ht 5\' 5"  (1.651 m)   Wt 174 lb 3.2 oz (79 kg)   SpO2 98%   BMI 28.99 kg/m       ASSESSMENT:  Diabetes type 2, on basal bolus insulin   See history of present illness for detailed discussion of current diabetes management, blood sugar patterns and problems identified  His A1c is 7.0  A1c is higher than expected for his home blood sugars which are averaging about 140 recently  He is on basal bolus insulin regimen and Trulicity 1.5 mg weekly  In the last month overall has excellent blood sugars both fasting and after meals with some  variability However discussed again the need to adjust her mealtime dose based on type of meal, fat content and carbohydrates No hypoglycemia and morning sugars are not low with current dose of [place]   HYPERTENSION: Well-controlled    CKD: Stable, GFR 47 recently, has proteinuria despite being on ACE inhibitor as seen on review of nephrology records    PLAN:   He will continue Tresiba at 44 units, to reduce his morning sugars are below 90 He can reduce the dose to as much is 7 units for lighter meals in the evening and if eating hotdogs and [place] fries go up to 10 units Make sure he takes his insulin with him when eating out To check some readings after breakfast also Reminded him to check his feet regularly Given him printout on FARXIGA and discussed the benefits again on reducing long-term deterioration of kidney function as well as cardiovascular benefits.  Needs to take this to the nephrologist and discuss starting this Encouraged him to be as active with walking is much as possible  Patient Instructions  7 units for light meals at nite and upto 10 for larger meals       Reather Littler 12/30/2022, 11:36 AM   Note: This office note was prepared with Dragon voice recognition system technology. Any transcriptional errors that result from this process are unintentional.

## 2022-12-30 NOTE — Patient Instructions (Signed)
7 units for light meals at nite and upto 10 for larger meals

## 2022-12-31 DIAGNOSIS — M79676 Pain in unspecified toe(s): Secondary | ICD-10-CM | POA: Diagnosis not present

## 2022-12-31 DIAGNOSIS — L84 Corns and callosities: Secondary | ICD-10-CM | POA: Diagnosis not present

## 2022-12-31 DIAGNOSIS — B351 Tinea unguium: Secondary | ICD-10-CM | POA: Diagnosis not present

## 2022-12-31 DIAGNOSIS — E1142 Type 2 diabetes mellitus with diabetic polyneuropathy: Secondary | ICD-10-CM | POA: Diagnosis not present

## 2023-01-03 ENCOUNTER — Other Ambulatory Visit: Payer: Self-pay | Admitting: Family Medicine

## 2023-01-13 ENCOUNTER — Ambulatory Visit (INDEPENDENT_AMBULATORY_CARE_PROVIDER_SITE_OTHER): Payer: Medicare Other | Admitting: Family Medicine

## 2023-01-13 ENCOUNTER — Encounter: Payer: Self-pay | Admitting: Family Medicine

## 2023-01-13 VITALS — BP 138/60 | HR 63 | Temp 97.1°F | Resp 20 | Ht 65.0 in | Wt 175.2 lb

## 2023-01-13 DIAGNOSIS — Z0000 Encounter for general adult medical examination without abnormal findings: Secondary | ICD-10-CM

## 2023-01-13 DIAGNOSIS — E1159 Type 2 diabetes mellitus with other circulatory complications: Secondary | ICD-10-CM | POA: Diagnosis not present

## 2023-01-13 DIAGNOSIS — Z794 Long term (current) use of insulin: Secondary | ICD-10-CM

## 2023-01-13 DIAGNOSIS — I152 Hypertension secondary to endocrine disorders: Secondary | ICD-10-CM

## 2023-01-13 DIAGNOSIS — F01B11 Vascular dementia, moderate, with agitation: Secondary | ICD-10-CM | POA: Diagnosis not present

## 2023-01-13 DIAGNOSIS — E1122 Type 2 diabetes mellitus with diabetic chronic kidney disease: Secondary | ICD-10-CM

## 2023-01-13 DIAGNOSIS — N1832 Chronic kidney disease, stage 3b: Secondary | ICD-10-CM

## 2023-01-13 DIAGNOSIS — Z23 Encounter for immunization: Secondary | ICD-10-CM

## 2023-01-13 DIAGNOSIS — E1169 Type 2 diabetes mellitus with other specified complication: Secondary | ICD-10-CM | POA: Diagnosis not present

## 2023-01-13 DIAGNOSIS — I480 Paroxysmal atrial fibrillation: Secondary | ICD-10-CM | POA: Diagnosis not present

## 2023-01-13 DIAGNOSIS — Z0001 Encounter for general adult medical examination with abnormal findings: Secondary | ICD-10-CM

## 2023-01-13 DIAGNOSIS — E1142 Type 2 diabetes mellitus with diabetic polyneuropathy: Secondary | ICD-10-CM | POA: Diagnosis not present

## 2023-01-13 DIAGNOSIS — E785 Hyperlipidemia, unspecified: Secondary | ICD-10-CM

## 2023-01-13 NOTE — Patient Instructions (Signed)
Preventive Care 65 Years and Older, Male Preventive care refers to lifestyle choices and visits with your health care provider that can promote health and wellness. Preventive care visits are also called wellness exams. What can I expect for my preventive care visit? Counseling During your preventive care visit, your health care provider may ask about your: Medical history, including: Past medical problems. Family medical history. History of falls. Current health, including: Emotional well-being. Home life and relationship well-being. Sexual activity. Memory and ability to understand (cognition). Lifestyle, including: Alcohol, nicotine or tobacco, and drug use. Access to firearms. Diet, exercise, and sleep habits. Work and work environment. Sunscreen use. Safety issues such as seatbelt and bike helmet use. Physical exam Your health care provider will check your: Height and weight. These may be used to calculate your BMI (body mass index). BMI is a measurement that tells if you are at a healthy weight. Waist circumference. This measures the distance around your waistline. This measurement also tells if you are at a healthy weight and may help predict your risk of certain diseases, such as type 2 diabetes and high blood pressure. Heart rate and blood pressure. Body temperature. Skin for abnormal spots. What immunizations do I need?  Vaccines are usually given at various ages, according to a schedule. Your health care provider will recommend vaccines for you based on your age, medical history, and lifestyle or other factors, such as travel or where you work. What tests do I need? Screening Your health care provider may recommend screening tests for certain conditions. This may include: Lipid and cholesterol levels. Diabetes screening. This is done by checking your blood sugar (glucose) after you have not eaten for a while (fasting). Hepatitis C test. Hepatitis B test. HIV (human  immunodeficiency virus) test. STI (sexually transmitted infection) testing, if you are at risk. Lung cancer screening. Colorectal cancer screening. Prostate cancer screening. Abdominal aortic aneurysm (AAA) screening. You may need this if you are a current or former smoker. Talk with your health care provider about your test results, treatment options, and if necessary, the need for more tests. Follow these instructions at home: Eating and drinking  Eat a diet that includes fresh fruits and vegetables, whole grains, lean protein, and low-fat dairy products. Limit your intake of foods with high amounts of sugar, saturated fats, and salt. Take vitamin and mineral supplements as recommended by your health care provider. Do not drink alcohol if your health care provider tells you not to drink. If you drink alcohol: Limit how much you have to 0-2 drinks a day. Know how much alcohol is in your drink. In the [place] one drink equals one 12 oz bottle of beer (355 mL), one 5 oz glass of wine (148 mL), or one 1 oz glass of hard liquor (44 mL). Lifestyle Brush your teeth every morning and night with fluoride toothpaste. Floss one time each day. Exercise for at least 30 minutes 5 or more days each week. Do not use any products that contain nicotine or tobacco. These products include cigarettes, chewing tobacco, and vaping devices, such as e-cigarettes. If you need help quitting, ask your health care provider. Do not use drugs. If you are sexually active, practice safe sex. Use a condom or other form of protection to prevent STIs. Take aspirin only as told by your health care provider. Make sure that you understand how much to take and what form to take. Work with your health care provider to find out whether it is safe   and beneficial for you to take aspirin daily. Ask your health care provider if you need to take a cholesterol-lowering medicine (statin). Find healthy ways to manage stress, such  as: Meditation, yoga, or listening to music. Journaling. Talking to a trusted person. Spending time with friends and family. Safety Always wear your seat belt while driving or riding in a vehicle. Do not drive: If you have been drinking alcohol. Do not ride with someone who has been drinking. When you are tired or distracted. While texting. If you have been using any mind-altering substances or drugs. Wear a helmet and other protective equipment during sports activities. If you have firearms in your house, make sure you follow all gun safety procedures. Minimize exposure to UV radiation to reduce your risk of skin cancer. What's next? Visit your health care provider once a year for an annual wellness visit. Ask your health care provider how often you should have your eyes and teeth checked. Stay up to date on all vaccines. This information is not intended to replace advice given to you by your health care provider. Make sure you discuss any questions you have with your health care provider. Document Revised: 01/01/2021 Document Reviewed: 01/01/2021 Elsevier Patient Education  2024 Elsevier Inc.  

## 2023-01-13 NOTE — Progress Notes (Signed)
Evan Stanley is a 82 y.o. male presents to office today for annual physical exam examination.    Concerns today include: 1.  No concerns today he saw his endocrinologist and A1c was at goal at 7.0 just a couple of weeks ago.  He seems to be doing relatively well from a diabetic standpoint.  Still has difficulty with his left eye and cannot see out of it.  He is eager to get back to driving but his wife notes that he has not been released by neurology to do so.  He has been having some difficulty with Eliquis because of superexpensive.  Wanting to know if perhaps there is any help for this or if we have samples today.  Reports no rectal bleeding, blood in urine etc.  Occupation: retired, Marital status: married and accompanied by his spouse today, Substance use: none Diet: diabetic, Exercise: none Last eye exam: UTD Refills needed today: none Immunizations needed: Shingles Immunization History  Administered Date(s) Administered   Fluad Quad(high Dose 65+) 05/23/2019, 04/25/2020, 07/08/2022   Influenza Split 04/27/2016   Influenza Whole 05/26/2010   Influenza, High Dose Seasonal PF 05/20/2017, 05/23/2018   Influenza,inj,Quad PF,6+ Mos 06/14/2013, 04/16/2014, 06/18/2015   Influenza-Unspecified 05/14/2016, 05/20/2017, 05/23/2018   Moderna Sars-Covid-2 Vaccination 08/16/2019, 09/13/2019, 07/04/2020   Pneumococcal Conjugate-13 06/18/2015   Pneumococcal Polysaccharide-23 12/16/2016   Td 07/08/2022   Tdap 03/05/2011     Past Medical History:  Diagnosis Date   AKI (acute kidney injury) (HCC)    Allergy    Anxiety    Arthritis    Right shoulder   Atrial fibrillation (HCC)    Carotid artery stenosis    Cataract    CHF (congestive heart failure) (HCC)    CVA (cerebral vascular accident) (HCC) 04/28/2016   Decreased vision    left eye   Diabetes mellitus without complication (HCC)    Takes Metformin   Hyperlipidemia    Hypertension    Salmonella bacteremia 04/29/2016   Sepsis  (HCC) 04/29/2016   Stroke (HCC) 11/18/2015   no deficits   TIA (transient ischemic attack)    affected left eye   Urgency of urination    Social History   Socioeconomic History   Marital status: Married    Spouse name: [name]   Number of children: 2   Years of education: 11   Highest education level: 11th grade  Occupational History   Occupation: maintenance    Employer: [employer]    Comment: Retired  Tobacco Use   Smoking status: Former    Packs/day: 2.00    Years: 20.00    Additional pack years: 0.00    Total pack years: 40.00    Types: Cigarettes    Quit date: 07/20/1986    Years since quitting: 36.5   Smokeless tobacco: Never  Vaping Use   Vaping Use: Never used  Substance and Sexual Activity   Alcohol use: No    Alcohol/week: 0.0 standard drinks of alcohol   Drug use: No   Sexual activity: Yes  Other Topics Concern   Not on file  Social History Narrative   Lives with wife in home   Right Handed   Drinks 6-7 cups caffeine daily   Social Determinants of Health   Financial Resource Strain: Low Risk  (07/06/2022)   Overall Financial Resource Strain (CARDIA)    Difficulty of Paying Living Expenses: Not hard at all  Food Insecurity: No Food Insecurity (07/06/2022)   Hunger Vital Sign  Worried About Programme researcher, broadcasting/film/video in the Last Year: Never true    Ran Out of Food in the Last Year: Never true  Transportation Needs: No Transportation Needs (07/06/2022)   PRAPARE - Administrator, Civil Service (Medical): No    Lack of Transportation (Non-Medical): No  Physical Activity: Inactive (07/06/2022)   Exercise Vital Sign    Days of Exercise per Week: 0 days    Minutes of Exercise per Session: 0 min  Stress: No Stress Concern Present (07/06/2022)   [employer] of Occupational Health - Occupational Stress Questionnaire    Feeling of Stress : Not at all  Social Connections: Moderately Isolated (07/06/2022)   Social Connection and Isolation Panel [NHANES]     Frequency of Communication with Friends and Family: More than three times a week    Frequency of Social Gatherings with Friends and Family: More than three times a week    Attends Religious Services: Never    Database administrator or Organizations: No    Attends Banker Meetings: Never    Marital Status: Married  Catering manager Violence: Not At Risk (07/06/2022)   Humiliation, Afraid, Rape, and Kick questionnaire    Fear of Current or Ex-Partner: No    Emotionally Abused: No    Physically Abused: No    Sexually Abused: No   Past Surgical History:  Procedure Laterality Date   AORTIC VALVE REPLACEMENT     BACK SURGERY  80s   CARDIAC VALVE REPLACEMENT  11/01/2008   21-mm Edwards Pericardial Magna - Ease valve   ENDARTERECTOMY Right 01/13/2016   Procedure: ENDARTERECTOMY CAROTID - RIGHT;  Surgeon: Fransisco Hertz, MD;  [facility];  Service: Vascular;  Laterality: Right;   EYE SURGERY     laser surgery, left eye  Left 10-29-2015    retinal surgery/ Fawn Kirk MD    PERIPHERAL VASCULAR CATHETERIZATION Right 11/18/2015   Procedure: Carotid Angiography;  Surgeon: Fransisco Hertz, MD;  Location: [facility] INVASIVE CV LAB;  Service: Cardiovascular;  Laterality: Right;   PERIPHERAL VASCULAR CATHETERIZATION N/A 11/18/2015   Procedure: Aortic Arch Angiography;  Surgeon: Fransisco Hertz, MD;  Location: [facility] INVASIVE CV LAB;  Service: Cardiovascular;  Laterality: N/A;   TEE WITHOUT CARDIOVERSION N/A 05/05/2016   Procedure: TRANSESOPHAGEAL ECHOCARDIOGRAM (TEE);  Surgeon: Lewayne Bunting, MD;  Location: [facility] ENDOSCOPY;  Service: Cardiovascular;  Laterality: N/A;   Family History  Problem Relation Age of Onset   Heart disease Mother    Cancer Mother        breast   Stroke Father 50   Hypertension Father    Early death Sister        infant death   Heart disease Brother    Hydrocephalus Brother    Heart disease Brother    Diabetes Brother    Cancer Brother        prostat   Healthy Son     Healthy Son     Current Outpatient Medications:    acetaminophen (TYLENOL) 325 MG tablet, Take 2 tablets (650 mg total) by mouth every 6 (six) hours as needed for mild pain (or Fever >/= 101)., Disp: , Rfl:    apixaban (ELIQUIS) 5 MG TABS tablet, TAKE 1 TABLET BY MOUTH TWICE  DAILY, Disp: 200 tablet, Rfl: 0   atorvastatin (LIPITOR) 40 MG tablet, Take 1 tablet (40 mg total) by mouth daily., Disp: 100 tablet, Rfl: 3   atropine 1 % ophthalmic solution, Place  1 drop into the left eye 2 (two) times daily., Disp: 3 mL, Rfl: 11   Blood Glucose Monitoring Suppl (ONETOUCH VERIO) w/Device KIT, Use 1-4 times daily as needed/directed DX E11.9, Disp: 1 kit, Rfl: 0   cetirizine (ZYRTEC) 10 MG tablet, Take 1 tablet by mouth as needed for allergies., Disp: , Rfl:    cholecalciferol (VITAMIN D3) 25 MCG (1000 UNIT) tablet, Take 1,000 Units by mouth daily., Disp: , Rfl:    cyanocobalamin 1000 MCG tablet, Take 1,000 mcg by mouth daily., Disp: , Rfl:    Dulaglutide (TRULICITY) 1.5 MG/0.5ML SOPN, Inject 1.5 mg into the skin once a week. Inject the contents of one pen once per week, Disp: 6 mL, Rfl: 5   hydrochlorothiazide (HYDRODIURIL) 25 MG tablet, Take 1 tablet (25 mg total) by mouth daily., Disp: 100 tablet, Rfl: 3   insulin aspart (NOVOLOG) 100 UNIT/ML injection, Inject 9 Units into the skin in the morning and at bedtime. Pt takes 9 units before breakfast, and 8 units before supper., Disp: , Rfl:    insulin degludec (TRESIBA FLEXTOUCH) 200 UNIT/ML FlexTouch Pen, Inject 52 Units into the skin daily., Disp: , Rfl:    latanoprost (XALATAN) 0.005 % ophthalmic solution, Place 1 drop into the left eye at bedtime. , Disp: , Rfl:    lisinopril (ZESTRIL) 20 MG tablet, Take 1 tablet (20 mg total) by mouth daily., Disp: 100 tablet, Rfl: 3   memantine (NAMENDA) 10 MG tablet, Take 1 tablet (10 mg total) by mouth 2 (two) times daily., Disp: 180 tablet, Rfl: 3   metoprolol succinate (TOPROL-XL) 100 MG 24 hr tablet, TAKE 1 TABLET  BY MOUTH  DAILY WITH OR IMMEDIATELY  FOLLOWING A MEAL, Disp: 90 tablet, Rfl: 3   ONETOUCH VERIO test strip, Use 1-4 times daily as directed/needed   DX E11.9, Disp: 200 each, Rfl: 12   UNABLE TO FIND, Diet: NAS and consistent carbohydrate, Disp: , Rfl:   Allergies  Allergen Reactions   Baclofen Other (See Comments)    Stroke like symptoms   Morphine And Codeine Other (See Comments)    Delirium      ROS: Review of Systems A comprehensive review of systems was negative except for: Eyes: positive for contacts/glasses and blindness in left eye Musculoskeletal: positive for muscle weakness Neurological: positive for coordination problems and memory problems Behavioral/Psych: positive for bad mood    Physical exam BP 138/60   Pulse 63   Temp (!) 97.1 F (36.2 C) (Oral)   Resp 20   Ht 5\' 5"  (1.651 m)   Wt 175 lb 4 oz (79.5 kg)   SpO2 96%   BMI 29.16 kg/m  General appearance: alert, cooperative, appears stated age, and no distress Head: Normocephalic, without obvious abnormality, atraumatic Eyes: negative findings: Left eyelid with ptosis especially in the left lower lid.  He has scleral changes on the left as well.  Right with white sclera, reactive pupil Ears: normal TM's and external ear canals both ears Nose: Nares normal. Septum midline. Mucosa normal. No drainage or sinus tenderness. Throat: lips, mucosa, and tongue normal; teeth and gums normal Neck: no adenopathy, supple, symmetrical, trachea midline, thyroid not enlarged, symmetric, no tenderness/mass/nodules, and heart murmur radiates to carotids Back: symmetric, no curvature. ROM normal. No CVA tenderness. Lungs: clear to auscultation bilaterally Chest wall: no tenderness Heart:  Seemingly regular rate and rhythm with systolic murmur appreciated that radiates to carotids Abdomen: soft, non-tender; bowel sounds normal; no masses,  no organomegaly Extremities: extremities normal,  atraumatic, no cyanosis or edema Pulses: 2+  and symmetric Skin:  Senile purpura present. Lymph nodes: Cervical, supraclavicular, and axillary nodes normal. Neurologic: Requires cane for ambulation.  Gait is unsteady.  Vision in left eye is absent.  Assessment/ Plan: [name] here for annual physical exam.   Annual physical exam  Hypertension associated with diabetes (HCC)  Hyperlipidemia associated with type 2 diabetes mellitus (HCC)  Type 2 diabetes mellitus with stage 3b chronic kidney disease, with long-term current use of insulin (HCC)  Diabetic peripheral neuropathy associated with type 2 diabetes mellitus (HCC)  Paroxysmal A-fib (HCC)  Moderate vascular dementia with agitation (HCC)  His first shingles vaccination was administered today  Blood pressure is controlled.  No changes needed.   Continue cholesterol control.  Labs were recently collected and patient continues to follow with endocrinology and nephrology as directed.  Will defer further lab checks to nephrology as he has lab appointment scheduled for Monday  Peripheral neuropathy is chronic and stable.  Continue to utilize cane for ambulation.  Would absolutely limit driving until he is cleared by neurology.  May need formalized driving test and/or completion of forms by specialty to clear him for driving  Atrial fibrillation is chronic and stable and he seems to be in sinus rhythm today.  Dementia is chronic and stable  Counseled on healthy lifestyle choices, including diet (rich in fruits, vegetables and lean meats and low in salt and simple carbohydrates) and exercise (at least 30 minutes of moderate physical activity daily).  Patient to follow up in 4-27m  Denis Carreon M. Nadine Counts, DO

## 2023-01-18 ENCOUNTER — Other Ambulatory Visit: Payer: Medicare Other

## 2023-01-18 DIAGNOSIS — E1122 Type 2 diabetes mellitus with diabetic chronic kidney disease: Secondary | ICD-10-CM | POA: Diagnosis not present

## 2023-01-18 DIAGNOSIS — N189 Chronic kidney disease, unspecified: Secondary | ICD-10-CM | POA: Diagnosis not present

## 2023-01-18 DIAGNOSIS — E1129 Type 2 diabetes mellitus with other diabetic kidney complication: Secondary | ICD-10-CM | POA: Diagnosis not present

## 2023-01-18 DIAGNOSIS — I129 Hypertensive chronic kidney disease with stage 1 through stage 4 chronic kidney disease, or unspecified chronic kidney disease: Secondary | ICD-10-CM | POA: Diagnosis not present

## 2023-01-18 DIAGNOSIS — R809 Proteinuria, unspecified: Secondary | ICD-10-CM | POA: Diagnosis not present

## 2023-01-25 DIAGNOSIS — E1129 Type 2 diabetes mellitus with other diabetic kidney complication: Secondary | ICD-10-CM | POA: Diagnosis not present

## 2023-01-25 DIAGNOSIS — I480 Paroxysmal atrial fibrillation: Secondary | ICD-10-CM | POA: Diagnosis not present

## 2023-01-25 DIAGNOSIS — E1122 Type 2 diabetes mellitus with diabetic chronic kidney disease: Secondary | ICD-10-CM | POA: Diagnosis not present

## 2023-01-25 DIAGNOSIS — I5032 Chronic diastolic (congestive) heart failure: Secondary | ICD-10-CM | POA: Diagnosis not present

## 2023-02-01 ENCOUNTER — Other Ambulatory Visit: Payer: Self-pay | Admitting: *Deleted

## 2023-02-01 DIAGNOSIS — E1159 Type 2 diabetes mellitus with other circulatory complications: Secondary | ICD-10-CM

## 2023-02-01 MED ORDER — METOPROLOL SUCCINATE ER 100 MG PO TB24
ORAL_TABLET | ORAL | 1 refills | Status: DC
Start: 2023-02-01 — End: 2023-04-12

## 2023-03-17 NOTE — Progress Notes (Unsigned)
[facility] [address] [place]. [place] [address] [phone]       OFFICE FOLLOW UP NOTE  Mr. [name] Date of Birth:  Jan 22, 1941 Medical Record Number:  [id number]    Primary neurologist: Dr. Terrace Arabia Reason for visit: Memory loss, hx of prior strokes    SUBJECTIVE:   CHIEF COMPLAINT:  No chief complaint on file.   HPI:   Evan Stanley is a 82 year old male, seen in request by his primary care physician Dr. Nadine Counts, Kathie Rhodes for evaluation of memory loss, gait abnormality, initial evaluation with Dr. Terrace Arabia on April 14, 2021   I reviewed and summarized the referring note.PMHX. HTN DM since 2010 HLD Atrial Fibrillation-on eliquis Stroke in Oct 2017, left side weakness, recovered well, was able to go back driving, golf Right endarterectomy January 13, 2016 Aortic valve replacement, Left eye, retinal artery occlusion in 2015, lost vision of left eye. Peripheral vascular disease. Smoker in the past,     Consult visit 04/14/2021 Dr. Terrace Arabia: He had a multiple stroke in the past, had multiple vascular risk factor, atrial fibrillation, diabetes, hypertension, hyperlipidemia, aortic valve replacement, has mild residual left-sided weakness, gait abnormality from previous stroke, already have some baseline gait abnormality  Wife noticed that patient has worsening gait abnormality memory loss since his COVID infection in August 2022, he was severely hydrated, high fever for few days, had bad congestion, achy all over, but does not require hospitalization  Since then, he was noted to have increased left-sided weakness, also developed this flailed movement of left lower extremity, sometimes involving left upper extremity, also increased confusion, memory loss,   He retired from maintenance at age 67, prior to the COVID infection in August 2022, he was still able to drive short distance to golf course, play golf, now wife is worried about leaving him alone at   house for few hours,   update Aug 11 2021 Dr. Terrace Arabia: he is accompanied by, wife at today's visit, since last visit in September 2022, he had made significant progress, home physical therapy has been very helpful  He continues to have significant memory loss, loss of vision of left eye due to previous left retinal artery occlusion, he likes to drive short distance to golf course, to check with his friends, but is no longer able to play, mild unsteady gait  Personally reviewed MRI of the brain in October 2022: Moderate generalized atrophy, scattered positive DWI lesion at the right frontal lobe, consistent with acute/subacute infarction, cerebral lacunar infarction in the cerebral hemisphere, pons, left cerebellar hemisphere, occlusion of left internal carotid artery, right vertebral artery, also was seen at previous MRI  He is taking Eliquis due to history of atrial fibrillation,  UPDATE 03/02/2022 JM: Patient returns for follow-up visit after prior visit with Dr. Terrace Arabia 7 months ago.  He is accompanied by his wife who provides majority of history.  Unfortunately, he has had some decline since prior visit especially with mood disturbance and sundowning which has worsened over the past month.  At times he can be physically aggressive or verbally abuse her.  He has poor balance which limits physical activity.  He has been advised not to drive but has still been driving short distance, his wife has not been able to take his keys away due to his aggression.  He is not currently on any medications for dementia nor medications for mood. He can have occasional agitation during the day but significantly worsened at night. Wife mentions he  has difficulty tolerating sedating medications but is interested in starting on something for his memory.   He routinely follows with PCP, endocrinology with recent A1c 6.6, cardiology and nephrology  No further concerns at this time   Update 09/08/2022 JM: Patient returns for  77-month follow-up accompanied by his wife.  Was started on Namenda with gradual titration to 10 mg twice daily at prior visit for cognitive impairment with mood disturbance.  Wife notes improvement of mood and cognition since initiating Namenda.  His balance remains poor per wife and has frequent falls (will lose balance if making turns too quickly), thankfully without significant injury or hitting of head. Continued use of cane. Wife has been trying to encourage him to do exercises at home but he will easily become agitated with her if she pushes him too much.    Update 03/18/2023 JM: Patient returns for follow-up visit.  Reports cognition ***.  Remains on Namenda 10 mg twice daily.  Behaviors ***        ROS:   14 system review of systems performed and negative with exception of those listed in HPI  PMH:  Past Medical History:  Diagnosis Date   AKI (acute kidney injury) (HCC)    Allergy    Anxiety    Arthritis    Right shoulder   Atrial fibrillation (HCC)    Carotid artery stenosis    Cataract    CHF (congestive heart failure) (HCC)    CVA (cerebral vascular accident) (HCC) 04/28/2016   Decreased vision    left eye   Diabetes mellitus without complication (HCC)    Takes Metformin   Hyperlipidemia    Hypertension    Salmonella bacteremia 04/29/2016   Sepsis (HCC) 04/29/2016   Stroke (HCC) 11/18/2015   no deficits   TIA (transient ischemic attack)    affected left eye   Urgency of urination     PSH:  Past Surgical History:  Procedure Laterality Date   AORTIC VALVE REPLACEMENT     BACK SURGERY  80s   CARDIAC VALVE REPLACEMENT  11/01/2008   21-mm Edwards Pericardial Magna - Ease valve   ENDARTERECTOMY Right 01/13/2016   Procedure: ENDARTERECTOMY CAROTID - RIGHT;  Surgeon: Fransisco Hertz, MD;  Location: [facility] OR;  Service: Vascular;  Laterality: Right;   EYE SURGERY     laser surgery, left eye  Left 10-29-2015    retinal surgery/ Fawn Kirk MD    PERIPHERAL VASCULAR  CATHETERIZATION Right 11/18/2015   Procedure: Carotid Angiography;  Surgeon: Fransisco Hertz, MD;  Location: [facility] INVASIVE CV LAB;  Service: Cardiovascular;  Laterality: Right;   PERIPHERAL VASCULAR CATHETERIZATION N/A 11/18/2015   Procedure: Aortic Arch Angiography;  Surgeon: Fransisco Hertz, MD;  Location: [facility] INVASIVE CV LAB;  Service: Cardiovascular;  Laterality: N/A;   TEE WITHOUT CARDIOVERSION N/A 05/05/2016   Procedure: TRANSESOPHAGEAL ECHOCARDIOGRAM (TEE);  Surgeon: Lewayne Bunting, MD;  Location: [facility] ENDOSCOPY;  Service: Cardiovascular;  Laterality: N/A;    Social History:  Social History   Socioeconomic History   Marital status: Married    Spouse name: [name]   Number of children: 2   Years of education: 11   Highest education level: 11th grade  Occupational History   Occupation: maintenance    Employer: [employer]    Comment: Retired  Tobacco Use   Smoking status: Former    Current packs/day: 0.00    Average packs/day: 2.0 packs/day for 20.0 years (40.0 ttl pk-yrs)    Types: Cigarettes  Start date: 07/20/1966    Quit date: 07/20/1986    Years since quitting: 36.6   Smokeless tobacco: Never  Vaping Use   Vaping status: Never Used  Substance and Sexual Activity   Alcohol use: No    Alcohol/week: 0.0 standard drinks of alcohol   Drug use: No   Sexual activity: Yes  Other Topics Concern   Not on file  Social History Narrative   Lives with wife in home   Right Handed   Drinks 6-7 cups caffeine daily   Social Determinants of Health   Financial Resource Strain: Low Risk  (07/06/2022)   Overall Financial Resource Strain (CARDIA)    Difficulty of Paying Living Expenses: Not hard at all  Food Insecurity: No Food Insecurity (07/06/2022)   Hunger Vital Sign    Worried About Running Out of Food in the Last Year: Never true    Ran Out of Food in the Last Year: Never true  Transportation Needs: No Transportation Needs (07/06/2022)   PRAPARE - Administrator, Civil Service  (Medical): No    Lack of Transportation (Non-Medical): No  Physical Activity: Inactive (07/06/2022)   Exercise Vital Sign    Days of Exercise per Week: 0 days    Minutes of Exercise per Session: 0 min  Stress: No Stress Concern Present (07/06/2022)   [employer] of Occupational Health - Occupational Stress Questionnaire    Feeling of Stress : Not at all  Social Connections: Moderately Isolated (07/06/2022)   Social Connection and Isolation Panel [NHANES]    Frequency of Communication with Friends and Family: More than three times a week    Frequency of Social Gatherings with Friends and Family: More than three times a week    Attends Religious Services: Never    Database administrator or Organizations: No    Attends Banker Meetings: Never    Marital Status: Married  Catering manager Violence: Not At Risk (07/06/2022)   Humiliation, Afraid, Rape, and Kick questionnaire    Fear of Current or Ex-Partner: No    Emotionally Abused: No    Physically Abused: No    Sexually Abused: No    Family History:  Family History  Problem Relation Age of Onset   Heart disease Mother    Cancer Mother        breast   Stroke Father 1   Hypertension Father    Early death Sister        infant death   Heart disease Brother    Hydrocephalus Brother    Heart disease Brother    Diabetes Brother    Cancer Brother        prostat   Healthy Son    Healthy Son     Medications:   Current Outpatient Medications on File Prior to Visit  Medication Sig Dispense Refill   acetaminophen (TYLENOL) 325 MG tablet Take 2 tablets (650 mg total) by mouth every 6 (six) hours as needed for mild pain (or Fever >/= 101).     apixaban (ELIQUIS) 5 MG TABS tablet TAKE 1 TABLET BY MOUTH TWICE  DAILY 200 tablet 0   atorvastatin (LIPITOR) 40 MG tablet Take 1 tablet (40 mg total) by mouth daily. 100 tablet 3   atropine 1 % ophthalmic solution Place 1 drop into the left eye 2 (two) times daily. 3 mL 11    Blood Glucose Monitoring Suppl (ONETOUCH VERIO) w/Device KIT Use 1-4 times daily as needed/directed DX E11.9  1 kit 0   cetirizine (ZYRTEC) 10 MG tablet Take 1 tablet by mouth as needed for allergies.     cholecalciferol (VITAMIN D3) 25 MCG (1000 UNIT) tablet Take 1,000 Units by mouth daily.     cyanocobalamin 1000 MCG tablet Take 1,000 mcg by mouth daily.     Dulaglutide (TRULICITY) 1.5 MG/0.5ML SOPN Inject 1.5 mg into the skin once a week. Inject the contents of one pen once per week 6 mL 5   hydrochlorothiazide (HYDRODIURIL) 25 MG tablet Take 1 tablet (25 mg total) by mouth daily. 100 tablet 3   insulin aspart (NOVOLOG) 100 UNIT/ML injection Inject 9 Units into the skin in the morning and at bedtime. Pt takes 9 units before breakfast, and 8 units before supper.     insulin degludec (TRESIBA FLEXTOUCH) 200 UNIT/ML FlexTouch Pen Inject 52 Units into the skin daily.     latanoprost (XALATAN) 0.005 % ophthalmic solution Place 1 drop into the left eye at bedtime.      lisinopril (ZESTRIL) 20 MG tablet Take 1 tablet (20 mg total) by mouth daily. 100 tablet 3   memantine (NAMENDA) 10 MG tablet Take 1 tablet (10 mg total) by mouth 2 (two) times daily. 180 tablet 3   metoprolol succinate (TOPROL-XL) 100 MG 24 hr tablet TAKE 1 TABLET BY MOUTH  DAILY WITH OR IMMEDIATELY  FOLLOWING A MEAL 90 tablet 1   ONETOUCH VERIO test strip Use 1-4 times daily as directed/needed   DX E11.9 200 each 12   UNABLE TO FIND Diet: NAS and consistent carbohydrate     No current facility-administered medications on file prior to visit.    Allergies:   Allergies  Allergen Reactions   Baclofen Other (See Comments)    Stroke like symptoms   Morphine And Codeine Other (See Comments)    Delirium       OBJECTIVE:  Physical Exam  There were no vitals filed for this visit.   There is no height or weight on file to calculate BMI. No results found.  General: well developed, well nourished, elderly Caucasian male,  seated, in no evident distress Head: head normocephalic and atraumatic.   Neck: supple with no carotid or supraclavicular bruits Cardiovascular: regular rate and rhythm, no murmurs Musculoskeletal: no deformity Skin:  no rash/petichiae Vascular:  Normal pulses all extremities   Neurologic Exam Mental Status: Awake and fully alert. Oriented to place and time. Recent and remote memory impaired. Attention span, concentration and fund of knowledge impaired. Mood and affect appropriate.  Cranial Nerves: Pupils equal, briskly reactive to light. Extraocular movements full without nystagmus. Visual fields full to confrontation. Hearing intact. Facial sensation intact. Face, tongue, palate moves normally and symmetrically.  Motor: Normal strength on right side, increased tone LUE, mild weakness and increased tone LLE  Sensory.: intact to touch , pinprick , position and vibratory sensation.  Coordination: Rapid alternating movements normal in all extremities on right side. Finger-to-nose and heel-to-shin performed accurately on right side. Gait and Station: Arises from chair with mild difficulty. Stance is normal. Gait demonstrates hemiplegic gait with use of cane. Tandem walk and heel toe not attempted  Reflexes: 1+ and symmetric. Toes downgoing.      09/08/2022   11:23 AM 03/04/2022   11:57 AM 04/14/2021    5:00 PM  Montreal Cognitive Assessment   Visuospatial/ Executive (0/5) 3 3 1   Naming (0/3) 2 3 3   Attention: Read list of digits (0/2) 2 2 2   Attention: Read list of  letters (0/1) 0 1 1  Attention: Serial 7 subtraction starting at 100 (0/3) 3 0 0  Language: Repeat phrase (0/2) 0 1 1  Language : Fluency (0/1) 0 1 0  Abstraction (0/2) 0 0 0  Delayed Recall (0/5) 0 0 0  Orientation (0/6) 6 6 5   Total 16 17 13         09/08/2022   11:11 AM 07/08/2022    9:32 AM 10/29/2021    9:08 AM  MMSE - Mini Mental State Exam  Orientation to time 5 5 5   Orientation to Place 5 5 5   Registration 3 3 3    Attention/ Calculation 3 4 3   Recall 3 2 2   Language- name 2 objects 2 2 2   Language- repeat 1 1 1   Language- follow 3 step command 3 3 3   Language- read & follow direction 1 1 1   Write a sentence 1 0 1  Copy design 1 1 1   Total score 28 27 27         ASSESSMENT/PLAN: Evan Stanley is a 82 y.o. year old male   Dementia, likely mixed Alzheimer's and vascular with behavior disturbance  -behaviors improved since starting Namenda  -continue Namenda 10mg  twice daily - refill provided  -MoCA 16/30 (prior 17/30)  -MMSE 28/30 (prior 27/30)        Stroke Gait abnormality  Presented with Worsening gait abnormality Since COVID infection in August 2022             discussed doing HEP as previously advised, if they would like to pursue additional HH therapy, advised to call office  Multiple strokes in the past and multiple vascular risk factors including hypertension, hyperlipidemia, diabetes, atrial fibrillation, history of stroke, history of right internal carotid artery endarterectomy, chronic left ICA and VA occlusion, peripheral vascular disease, aortic valve replacement, previous smoker, aging,             MRI of the brain 04/2021 showed acute to subacute stroke involving right frontal subcortical region, worsening moderate generalized atrophy, moderate supratentorium small vessel disease, multiple lacunar infarction, evidence of occlusion of left internal carotid artery and right vertebral artery that was present at previous MRI            Continue Eliquis 5 mg twice a day and atorvastatin 40 mg daily -manage/prescribed by PCP/cardiology             Emphasized importance of increasing daily activity/exercise, compliant with his medications, increase water intake and routine follow-up with PCP/other specialties for stroke risk factor management      Follow up in 6 months or call earlier if needed   CC:  PCP: Raliegh Ip, DO    I spent 36 minutes of face-to-face and  non-face-to-face time with patient and wife.  This included previsit chart review, lab review, study review, order entry, electronic health record documentation, patient and wife education and discussion regarding above diagnoses and treatment plan and answered all other questions to patient and wife satisfaction   Ihor Austin, AGNP-BC  [facility] [address] [place], [place] [address]  Phone [phone] Fax [phone] Note: This document was prepared with digital dictation and possible smart phrase technology. Any transcriptional errors that result from this process are unintentional.

## 2023-03-18 ENCOUNTER — Encounter: Payer: Self-pay | Admitting: Adult Health

## 2023-03-18 ENCOUNTER — Ambulatory Visit: Payer: Medicare Other | Admitting: Adult Health

## 2023-03-18 VITALS — BP 137/56 | HR 61 | Ht 65.0 in | Wt 176.8 lb

## 2023-03-18 DIAGNOSIS — Z8673 Personal history of transient ischemic attack (TIA), and cerebral infarction without residual deficits: Secondary | ICD-10-CM

## 2023-03-18 DIAGNOSIS — G309 Alzheimer's disease, unspecified: Secondary | ICD-10-CM

## 2023-03-18 DIAGNOSIS — F01518 Vascular dementia, unspecified severity, with other behavioral disturbance: Secondary | ICD-10-CM

## 2023-03-18 DIAGNOSIS — F02818 Dementia in other diseases classified elsewhere, unspecified severity, with other behavioral disturbance: Secondary | ICD-10-CM

## 2023-03-18 DIAGNOSIS — R269 Unspecified abnormalities of gait and mobility: Secondary | ICD-10-CM | POA: Diagnosis not present

## 2023-03-18 NOTE — Patient Instructions (Addendum)
Your Plan:  Continue Namenda 10mg  twice daily for memory   Ensure you are routinely doing memory exercises, staying physically active, eating a healthy diet, getting good sleep and drinking plenty of water  Continue to follow with your PCP and cardiology for stroke risk factor management     Follow up in 6 months or call earlier if needed     Thank you for coming to see [place] at [facility]. I hope we have been able to provide you high quality care today.  You may receive a patient satisfaction survey over the next few weeks. We would appreciate your feedback and comments so that we may continue to improve ourselves and the health of our patients.

## 2023-03-25 ENCOUNTER — Encounter: Payer: Self-pay | Admitting: Pharmacist

## 2023-04-01 DIAGNOSIS — M79676 Pain in unspecified toe(s): Secondary | ICD-10-CM | POA: Diagnosis not present

## 2023-04-01 DIAGNOSIS — E1142 Type 2 diabetes mellitus with diabetic polyneuropathy: Secondary | ICD-10-CM | POA: Diagnosis not present

## 2023-04-01 DIAGNOSIS — B351 Tinea unguium: Secondary | ICD-10-CM | POA: Diagnosis not present

## 2023-04-01 DIAGNOSIS — L84 Corns and callosities: Secondary | ICD-10-CM | POA: Diagnosis not present

## 2023-04-09 ENCOUNTER — Telehealth: Payer: Self-pay | Admitting: Pharmacist

## 2023-04-09 NOTE — Telephone Encounter (Signed)
VM left instructing patient to pick up patient assistance insulin in lab fridge Endocrine managing

## 2023-04-11 ENCOUNTER — Other Ambulatory Visit: Payer: Self-pay | Admitting: Family Medicine

## 2023-04-11 DIAGNOSIS — I152 Hypertension secondary to endocrine disorders: Secondary | ICD-10-CM

## 2023-04-14 ENCOUNTER — Encounter: Payer: Self-pay | Admitting: *Deleted

## 2023-04-18 ENCOUNTER — Other Ambulatory Visit: Payer: Self-pay | Admitting: Family Medicine

## 2023-04-26 ENCOUNTER — Other Ambulatory Visit: Payer: Medicare Other

## 2023-04-26 DIAGNOSIS — I129 Hypertensive chronic kidney disease with stage 1 through stage 4 chronic kidney disease, or unspecified chronic kidney disease: Secondary | ICD-10-CM | POA: Diagnosis not present

## 2023-04-26 DIAGNOSIS — E1122 Type 2 diabetes mellitus with diabetic chronic kidney disease: Secondary | ICD-10-CM | POA: Diagnosis not present

## 2023-04-26 DIAGNOSIS — I5032 Chronic diastolic (congestive) heart failure: Secondary | ICD-10-CM | POA: Diagnosis not present

## 2023-04-26 DIAGNOSIS — E559 Vitamin D deficiency, unspecified: Secondary | ICD-10-CM | POA: Diagnosis not present

## 2023-04-26 DIAGNOSIS — N189 Chronic kidney disease, unspecified: Secondary | ICD-10-CM | POA: Diagnosis not present

## 2023-04-26 DIAGNOSIS — E1129 Type 2 diabetes mellitus with other diabetic kidney complication: Secondary | ICD-10-CM | POA: Diagnosis not present

## 2023-04-29 ENCOUNTER — Other Ambulatory Visit: Payer: Self-pay

## 2023-04-29 DIAGNOSIS — Z794 Long term (current) use of insulin: Secondary | ICD-10-CM

## 2023-05-03 DIAGNOSIS — E1129 Type 2 diabetes mellitus with other diabetic kidney complication: Secondary | ICD-10-CM | POA: Diagnosis not present

## 2023-05-03 DIAGNOSIS — R809 Proteinuria, unspecified: Secondary | ICD-10-CM | POA: Diagnosis not present

## 2023-05-03 DIAGNOSIS — E1122 Type 2 diabetes mellitus with diabetic chronic kidney disease: Secondary | ICD-10-CM | POA: Diagnosis not present

## 2023-05-03 DIAGNOSIS — I5032 Chronic diastolic (congestive) heart failure: Secondary | ICD-10-CM | POA: Diagnosis not present

## 2023-05-10 ENCOUNTER — Other Ambulatory Visit: Payer: Self-pay | Admitting: Family Medicine

## 2023-05-10 ENCOUNTER — Telehealth: Payer: Self-pay | Admitting: Family Medicine

## 2023-05-10 DIAGNOSIS — E1169 Type 2 diabetes mellitus with other specified complication: Secondary | ICD-10-CM

## 2023-05-10 NOTE — Telephone Encounter (Signed)
Patient is almost out of  insulin aspart (NOVOLOG) 100 UNIT/ML injection.  Calling to see when we will have more for them. Please call back.Marland Kitchen

## 2023-05-12 NOTE — Telephone Encounter (Signed)
Patient needs to call [employer] patient assistance program ([phone]) and follow up on his shipments & enrollment status.  Due to our current patient assistance volume, we are unable to do this for patients.  They must call and track.  We can call into pharmacy if patient needs more. We don't currently have samples

## 2023-05-12 NOTE — Telephone Encounter (Signed)
Pts wife called back stating that she called [employer] and was told that 4 boxes of needles were shipped to our office on 03/04/2023. Wife says they were only given the medicine (tresiba and novolog). Was not given any needles. Rep at [employer] advised wife to call our office about it. Please advise.

## 2023-05-12 NOTE — Telephone Encounter (Signed)
Patient aware to call patient assistant.

## 2023-05-18 ENCOUNTER — Other Ambulatory Visit: Payer: Self-pay

## 2023-05-20 ENCOUNTER — Encounter: Payer: Self-pay | Admitting: Endocrinology

## 2023-05-20 ENCOUNTER — Ambulatory Visit: Payer: Medicare Other | Admitting: Endocrinology

## 2023-05-20 VITALS — BP 160/70 | HR 75 | Resp 20 | Ht 65.0 in | Wt 180.2 lb

## 2023-05-20 DIAGNOSIS — E1165 Type 2 diabetes mellitus with hyperglycemia: Secondary | ICD-10-CM

## 2023-05-20 DIAGNOSIS — N183 Chronic kidney disease, stage 3 unspecified: Secondary | ICD-10-CM

## 2023-05-20 DIAGNOSIS — Z794 Long term (current) use of insulin: Secondary | ICD-10-CM

## 2023-05-20 DIAGNOSIS — E1122 Type 2 diabetes mellitus with diabetic chronic kidney disease: Secondary | ICD-10-CM

## 2023-05-20 DIAGNOSIS — I129 Hypertensive chronic kidney disease with stage 1 through stage 4 chronic kidney disease, or unspecified chronic kidney disease: Secondary | ICD-10-CM | POA: Diagnosis not present

## 2023-05-20 LAB — BASIC METABOLIC PANEL
BUN: 22 mg/dL (ref 6–23)
CO2: 31 meq/L (ref 19–32)
Calcium: 9.4 mg/dL (ref 8.4–10.5)
Chloride: 102 meq/L (ref 96–112)
Creatinine, Ser: 2.01 mg/dL — ABNORMAL HIGH (ref 0.40–1.50)
GFR: 30.8 mL/min — ABNORMAL LOW (ref >60.00)
Glucose, Bld: 277 mg/dL — ABNORMAL HIGH (ref 70–99)
Potassium: 4.8 meq/L (ref 3.5–5.1)
Sodium: 141 meq/L (ref 135–145)

## 2023-05-20 LAB — POCT GLYCOSYLATED HEMOGLOBIN (HGB A1C): Hemoglobin A1C: 7.4 % — AB (ref 4.0–5.6)

## 2023-05-20 LAB — MICROALBUMIN / CREATININE URINE RATIO
Creatinine,U: 234.5 mg/dL
Microalb Creat Ratio: 32 mg/g — ABNORMAL HIGH (ref 0.0–30.0)
Microalb, Ur: 75 mg/dL — ABNORMAL HIGH (ref 0.0–1.9)

## 2023-05-20 MED ORDER — INSULIN PEN NEEDLE 32G X 4 MM MISC: 3 refills | Status: AC

## 2023-05-20 NOTE — Progress Notes (Signed)
Outpatient Endocrinology Note [place] Jency Schnieders, MD  05/20/23  Patient's Name: Evan Stanley    DOB: 1941/02/01    MRN: [id number]                                                    REASON OF VISIT: Follow up of type 2 diabetes mellitus  PCP: Raliegh Ip, DO  HISTORY OF PRESENT ILLNESS:   AUDREY SUDBURY is a 82 y.o. old male with past medical history listed below, is here for follow up for type 2 diabetes mellitus.   Pertinent Diabetes History: Patient was diagnosed with type 2 diabetes mellitus in 2010.  Chronic Diabetes Complications : Retinopathy: ? no. Last ophthalmology exam was done on annually, following with ophthalmology regularly.  Nephropathy: CKD IIIb on ACE/ARB / lisinopril, following with nephrology. Peripheral neuropathy: no Coronary artery disease: yes s/p CABG.  Stroke: yes  Relevant comorbidities and cardiovascular risk factors: Obesity: no Body mass index is 29.99 kg/m.  Hypertension: Yes  Hyperlipidemia : Yes, on statin   Current / Home Diabetic regimen includes: Tresiba 44 units daily  in the morning. NovoLog 9 units with breakfast and dinner. Trulicity  1.5 mg weekly.  Patient gets tresiba / Technical sales engineer / Trulicity from patient assistance.   Prior diabetic medications: Glimepiride  and metformin in the past.   Glycemic data:   He has refused to use CGM in the past. He has One Touch Verio flex glucometer.  Glycemic glucometer from October 17 to May 20, 2023 downloaded and reviewed. Average blood sugar 150, lowest 90 and highest 243.  He has been checking an average of 1.8 times a day.  Some of the blood sugars.  Morning blood sugar 166, 134, 147, 139, 145, 121, 137.  Blood sugar at suppertime 90, 124, 243, 198, 128.  Hypoglycemia: Patient has no hypoglycemic episodes. Patient has hypoglycemia awareness.  Factors modifying glucose control: 1.  Diabetic diet assessment: 2- 3 meals.  2.  Staying active or exercising: Occasional golf  playing.  3.  Medication compliance: compliant all of the time.  Interval history  Glucometer data as reviewed above.  Hemoglobin A1c slightly went up 7.4% today.  Does not take NovoLog with lunch.  He is usually taking 9 units of NovoLog with breakfast and dinner.  No complaints today.  REVIEW OF SYSTEMS As per history of present illness.   PAST MEDICAL HISTORY: Past Medical History:  Diagnosis Date   AKI (acute kidney injury) (HCC)    Allergy    Anxiety    Arthritis    Right shoulder   Atrial fibrillation (HCC)    Carotid artery stenosis    Cataract    CHF (congestive heart failure) (HCC)    CVA (cerebral vascular accident) (HCC) 04/28/2016   Decreased vision    left eye   Diabetes mellitus without complication (HCC)    Takes Metformin   Hyperlipidemia    Hypertension    Salmonella bacteremia 04/29/2016   Sepsis (HCC) 04/29/2016   Stroke (HCC) 11/18/2015   no deficits   TIA (transient ischemic attack)    affected left eye   Urgency of urination     PAST SURGICAL HISTORY: Past Surgical History:  Procedure Laterality Date   AORTIC VALVE REPLACEMENT     BACK SURGERY  80s   CARDIAC VALVE REPLACEMENT  11/01/2008   21-mm Edwards Pericardial Magna - Ease valve   ENDARTERECTOMY Right 01/13/2016   Procedure: ENDARTERECTOMY CAROTID - RIGHT;  Surgeon: Fransisco Hertz, MD;  [facility]: [facility];  Service: Vascular;  Laterality: Right;   EYE SURGERY     laser surgery, left eye  Left 10-29-2015    retinal surgery/ Fawn Kirk MD    PERIPHERAL VASCULAR CATHETERIZATION Right 11/18/2015   Procedure: Carotid Angiography;  Surgeon: Fransisco Hertz, MD;  Location: [facility] INVASIVE CV LAB;  Service: Cardiovascular;  Laterality: Right;   PERIPHERAL VASCULAR CATHETERIZATION N/A 11/18/2015   Procedure: Aortic Arch Angiography;  Surgeon: Fransisco Hertz, MD;  Location: [facility] INVASIVE CV LAB;  Service: Cardiovascular;  Laterality: N/A;   TEE WITHOUT CARDIOVERSION N/A 05/05/2016   Procedure: TRANSESOPHAGEAL  ECHOCARDIOGRAM (TEE);  Surgeon: Lewayne Bunting, MD;  Location: [facility] ENDOSCOPY;  Service: Cardiovascular;  Laterality: N/A;    ALLERGIES: Allergies  Allergen Reactions   Baclofen Other (See Comments)    Stroke like symptoms   Morphine And Codeine Other (See Comments)    Delirium     FAMILY HISTORY:  Family History  Problem Relation Age of Onset   Heart disease Mother    Cancer Mother        breast   Stroke Father 84   Hypertension Father    Early death Sister        infant death   Heart disease Brother    Hydrocephalus Brother    Heart disease Brother    Diabetes Brother    Cancer Brother        prostat   Healthy Son    Healthy Son     SOCIAL HISTORY: Social History   Socioeconomic History   Marital status: Married    Spouse name: [name]   Number of children: 2   Years of education: 11   Highest education level: 11th grade  Occupational History   Occupation: maintenance    Employer: [employer]    Comment: Retired  Tobacco Use   Smoking status: Former    Current packs/day: 0.00    Average packs/day: 2.0 packs/day for 20.0 years (40.0 ttl pk-yrs)    Types: Cigarettes    Start date: 07/20/1966    Quit date: 07/20/1986    Years since quitting: 36.8   Smokeless tobacco: Never  Vaping Use   Vaping status: Never Used  Substance and Sexual Activity   Alcohol use: No    Alcohol/week: 0.0 standard drinks of alcohol   Drug use: No   Sexual activity: Yes  Other Topics Concern   Not on file  Social History Narrative   Lives with wife in home   Right Handed   Drinks 6-7 cups caffeine daily   Social Determinants of Health   Financial Resource Strain: Low Risk  (07/06/2022)   Overall Financial Resource Strain (CARDIA)    Difficulty of Paying Living Expenses: Not hard at all  Food Insecurity: No Food Insecurity (07/06/2022)   Hunger Vital Sign    Worried About Running Out of Food in the Last Year: Never true    Ran Out of Food in the Last Year: Never true   Transportation Needs: No Transportation Needs (07/06/2022)   PRAPARE - Administrator, Civil Service (Medical): No    Lack of Transportation (Non-Medical): No  Physical Activity: Inactive (07/06/2022)   Exercise Vital Sign    Days of Exercise per Week: 0 days    Minutes of Exercise per Session:  0 min  Stress: No Stress Concern Present (07/06/2022)   [employer] of Occupational Health - Occupational Stress Questionnaire    Feeling of Stress : Not at all  Social Connections: Moderately Isolated (07/06/2022)   Social Connection and Isolation Panel [NHANES]    Frequency of Communication with Friends and Family: More than three times a week    Frequency of Social Gatherings with Friends and Family: More than three times a week    Attends Religious Services: Never    Database administrator or Organizations: No    Attends Engineer, structural: Never    Marital Status: Married    MEDICATIONS:  Current Outpatient Medications  Medication Sig Dispense Refill   acetaminophen (TYLENOL) 325 MG tablet Take 2 tablets (650 mg total) by mouth every 6 (six) hours as needed for mild pain (or Fever >/= 101).     atorvastatin (LIPITOR) 40 MG tablet TAKE 1 TABLET BY MOUTH DAILY 100 tablet 0   Blood Glucose Monitoring Suppl (ONETOUCH VERIO) w/Device KIT Use 1-4 times daily as needed/directed DX E11.9 1 kit 0   cetirizine (ZYRTEC) 10 MG tablet Take 1 tablet by mouth as needed for allergies.     cholecalciferol (VITAMIN D3) 25 MCG (1000 UNIT) tablet Take 1,000 Units by mouth daily.     cyanocobalamin 1000 MCG tablet Take 1,000 mcg by mouth daily.     Dulaglutide (TRULICITY) 1.5 MG/0.5ML SOPN Inject 1.5 mg into the skin once a week. Inject the contents of one pen once per week 6 mL 5   ELIQUIS 5 MG TABS tablet TAKE 1 TABLET BY MOUTH TWICE  DAILY 200 tablet 0   hydrochlorothiazide (HYDRODIURIL) 25 MG tablet Take 1 tablet (25 mg total) by mouth daily. 100 tablet 3   insulin aspart  (NOVOLOG) 100 UNIT/ML injection Inject 9 Units into the skin in the morning and at bedtime. Pt takes 9 units before breakfast, and 8 units before supper.     insulin degludec (TRESIBA FLEXTOUCH) 200 UNIT/ML FlexTouch Pen Inject 52 Units into the skin daily.     Insulin Pen Needle 32G X 4 MM MISC Use 4x a day 300 each 3   latanoprost (XALATAN) 0.005 % ophthalmic solution Place 1 drop into the left eye at bedtime.      lisinopril (ZESTRIL) 20 MG tablet TAKE 1 TABLET BY MOUTH DAILY 100 tablet 0   memantine (NAMENDA) 10 MG tablet Take 1 tablet (10 mg total) by mouth 2 (two) times daily. 180 tablet 3   metoprolol succinate (TOPROL-XL) 100 MG 24 hr tablet TAKE 1 TABLET BY MOUTH DAILY  WITH OR IMMEDIATELY FOLLOWING A  MEAL 100 tablet 0   ONETOUCH VERIO test strip Use 1-4 times daily as directed/needed   DX E11.9 200 each 12   UNABLE TO FIND Diet: NAS and consistent carbohydrate     atropine 1 % ophthalmic solution Place 1 drop into the left eye 2 (two) times daily. 3 mL 11   No current facility-administered medications for this visit.    PHYSICAL EXAM: Vitals:   05/20/23 1049 05/20/23 1104  BP: (!) 160/70 (!) 160/70  Pulse: 75   Resp: 20   SpO2: 97%   Weight: 180 lb 3.2 oz (81.7 kg)   Height: 5\' 5"  (1.651 m)    Body mass index is 29.99 kg/m.  Wt Readings from Last 3 Encounters:  05/20/23 180 lb 3.2 oz (81.7 kg)  03/18/23 176 lb 12.8 oz (80.2 kg)  01/13/23 175  lb 4 oz (79.5 kg)    General: Well developed, well nourished male in no apparent distress.  HEENT: AT/[place], no external lesions.  Eyes: Conjunctiva clear and no icterus. Neck: Neck supple  Lungs: Respirations not labored Neurologic: Alert, oriented, normal speech Extremities / Skin: Dry.  Psychiatric: Does not appear depressed or anxious  Diabetic Foot Exam - Simple   No data filed    LABS Reviewed Lab Results  Component Value Date   HGBA1C 7.4 (A) 05/20/2023   HGBA1C 7.0 (A) 12/30/2022   HGBA1C 6.3 (H) 07/08/2022   Lab  Results  Component Value Date   FRUCTOSAMINE 274 08/12/2020   FRUCTOSAMINE 323 (H) 04/22/2018   Lab Results  Component Value Date   CHOL 114 07/08/2022   HDL 40 07/08/2022   LDLCALC 56 07/08/2022   LDLDIRECT 62 02/16/2017   TRIG 97 07/08/2022   CHOLHDL 2.9 07/08/2022   Lab Results  Component Value Date   MICRALBCREAT 13.5 08/12/2020   MICRALBCREAT 2.4 09/06/2019   Lab Results  Component Value Date   CREATININE 1.66 (H) 07/08/2022   Lab Results  Component Value Date   GFR 38.12 (L) 08/12/2020    ASSESSMENT / PLAN  1. Uncontrolled type 2 diabetes mellitus with hyperglycemia, with long-term current use of insulin (HCC)   2. Type 2 DM with CKD stage 3 and hypertension (HCC)     Diabetes Mellitus type 2, complicated by diabetic nephropathy/CAD. - Diabetic status / severity: Fair control.  Lab Results  Component Value Date   HGBA1C 7.4 (A) 05/20/2023    - Hemoglobin A1c goal : <7.5%  - Medications: See below.  I) continue Tresiba 44 units daily. II) adjust NovoLog 6 to 10 units with meals with breakfast and dinner based on meal size. III) continue Trulicity 1.5 mg weekly.  Patient assistance for [place] and [employer] for [employer] form completed.  - Home glucose testing: Before meals and bedtime. - Discussed/ Gave Hypoglycemia treatment plan.  # Consult : not required at this time.   # Annual urine for microalbuminuria/ creatinine ratio, no microalbuminuria currently, continue ACE/ARB /lisinopril, will check today.  Patient has been following with nephrology for CKD 3B. Last  Lab Results  Component Value Date   MICRALBCREAT 13.5 08/12/2020    # Foot check nightly.  # Annual dilated diabetic eye exams.   - Diet: Make healthy diabetic food choices - Life style / activity / exercise: Discussed.  2. Blood pressure  -  BP Readings from Last 1 Encounters:  05/20/23 (!) 160/70    - Control is in target.  - No change in current plans.  Patient reports  blood pressure is normal at home.  Asked to monitor at home and if BP stays high discuss with primary care provider.  3. Lipid status / Hyperlipidemia - Last  Lab Results  Component Value Date   LDLCALC 56 07/08/2022   - Continue atorvastatin 40 mg daily.  Managed by PCP.    Diagnoses and all orders for this visit:  Uncontrolled type 2 diabetes mellitus with hyperglycemia, with long-term current use of insulin (HCC) -     POCT glycosylated hemoglobin (Hb A1C) -     Microalbumin / creatinine urine ratio; Future -     Basic metabolic panel; Future -     Basic metabolic panel -     Microalbumin / creatinine urine ratio  Type 2 DM with CKD stage 3 and hypertension (HCC)  Other orders -  Insulin Pen Needle 32G X 4 MM MISC; Use 4x a day    DISPOSITION Follow up in clinic in 3 months suggested.   All questions answered and patient verbalized understanding of the plan.  [place] Akina Maish, MD [facility] Endocrinology [facility] [address] [place], [address] [place], [place] [address] Phone # [phone]  At least part of this note was generated using voice recognition software. Inadvertent word errors may have occurred, which were not recognized during the proofreading process.

## 2023-05-20 NOTE — Telephone Encounter (Signed)
Left detailed message per dpr  

## 2023-05-20 NOTE — Telephone Encounter (Signed)
Please let patient know pen needles are up front for pick up Did not send my chart message because they have not been read in the past

## 2023-05-20 NOTE — Patient Instructions (Signed)
Tresiba 44 units daily  in the morning. NovoLog 6-10 units with breakfast and dinner. Trulicity  1.5 mg weekly.

## 2023-05-24 ENCOUNTER — Telehealth: Payer: Self-pay

## 2023-05-24 NOTE — Telephone Encounter (Signed)
VM left making patient aware that patient assistance paper was completed and sent to [employer].

## 2023-07-08 DIAGNOSIS — L84 Corns and callosities: Secondary | ICD-10-CM | POA: Diagnosis not present

## 2023-07-08 DIAGNOSIS — B351 Tinea unguium: Secondary | ICD-10-CM | POA: Diagnosis not present

## 2023-07-08 DIAGNOSIS — E1142 Type 2 diabetes mellitus with diabetic polyneuropathy: Secondary | ICD-10-CM | POA: Diagnosis not present

## 2023-07-08 DIAGNOSIS — M79676 Pain in unspecified toe(s): Secondary | ICD-10-CM | POA: Diagnosis not present

## 2023-07-15 ENCOUNTER — Ambulatory Visit: Payer: Medicare Other

## 2023-07-15 VITALS — Ht 65.0 in | Wt 180.0 lb

## 2023-07-15 DIAGNOSIS — Z0000 Encounter for general adult medical examination without abnormal findings: Secondary | ICD-10-CM | POA: Diagnosis not present

## 2023-07-15 NOTE — Progress Notes (Signed)
Subjective:   Evan Stanley is a 82 y.o. male who presents for Medicare Annual/Subsequent preventive examination.  Visit Complete: Virtual I connected with  Joesph Stanley on 07/15/23 by a audio enabled telemedicine application and verified that I am speaking with the correct person using two identifiers.  Patient Location: Home  Provider Location: Home Office  I discussed the limitations of evaluation and management by telemedicine. The patient expressed understanding and agreed to proceed.  Vital Signs: Because this visit was a virtual/telehealth visit, some criteria may be missing or patient reported. Any vitals not documented were not able to be obtained and vitals that have been documented are patient reported.  Cardiac Risk Factors include: advanced age (>57men, >39 women);diabetes mellitus;dyslipidemia;hypertension;male gender     Objective:    Today's Vitals   07/15/23 1048  Weight: 180 lb (81.6 kg)  Height: 5\' 5"  (1.651 m)   Body mass index is 29.95 kg/m.     07/15/2023   10:55 AM 07/06/2022    1:28 PM 07/03/2021    1:58 PM 07/02/2020    1:30 PM 05/20/2020    2:58 PM 05/17/2020    9:46 AM 05/14/2020    8:24 AM  Advanced Directives  Does Patient Have a Medical Advance Directive? No No Yes No Yes No Yes  Type of Surveyor, minerals;Living will      Does patient want to make changes to medical advance directive?     No - Patient declined No - Patient declined No - Patient declined  Copy of Healthcare Power of Attorney in Chart?   No - copy requested      Would patient like information on creating a medical advance directive? Yes (MAU/Ambulatory/Procedural Areas - Information given) No - Patient declined  No - Patient declined       Current Medications (verified) Outpatient Encounter Medications as of 07/15/2023  Medication Sig   acetaminophen (TYLENOL) 325 MG tablet Take 2 tablets (650 mg total) by mouth every 6 (six) hours as  needed for mild pain (or Fever >/= 101).   atorvastatin (LIPITOR) 40 MG tablet TAKE 1 TABLET BY MOUTH DAILY   Blood Glucose Monitoring Suppl (ONETOUCH VERIO) w/Device KIT Use 1-4 times daily as needed/directed DX E11.9   cetirizine (ZYRTEC) 10 MG tablet Take 1 tablet by mouth as needed for allergies.   cholecalciferol (VITAMIN D3) 25 MCG (1000 UNIT) tablet Take 1,000 Units by mouth daily.   cyanocobalamin 1000 MCG tablet Take 1,000 mcg by mouth daily.   Dulaglutide (TRULICITY) 1.5 MG/0.5ML SOPN Inject 1.5 mg into the skin once a week. Inject the contents of one pen once per week   ELIQUIS 5 MG TABS tablet TAKE 1 TABLET BY MOUTH TWICE  DAILY   hydrochlorothiazide (HYDRODIURIL) 25 MG tablet Take 1 tablet (25 mg total) by mouth daily.   insulin aspart (NOVOLOG) 100 UNIT/ML injection Inject 9 Units into the skin in the morning and at bedtime. Pt takes 9 units before breakfast, and 8 units before supper.   insulin degludec (TRESIBA FLEXTOUCH) 200 UNIT/ML FlexTouch Pen Inject 52 Units into the skin daily.   Insulin Pen Needle 32G X 4 MM MISC Use 4x a day   latanoprost (XALATAN) 0.005 % ophthalmic solution Place 1 drop into the left eye at bedtime.    lisinopril (ZESTRIL) 20 MG tablet TAKE 1 TABLET BY MOUTH DAILY   memantine (NAMENDA) 10 MG tablet Take 1 tablet (10 mg total) by mouth 2 (  two) times daily.   metoprolol succinate (TOPROL-XL) 100 MG 24 hr tablet TAKE 1 TABLET BY MOUTH DAILY  WITH OR IMMEDIATELY FOLLOWING A  MEAL   ONETOUCH VERIO test strip Use 1-4 times daily as directed/needed   DX E11.9   UNABLE TO FIND Diet: NAS and consistent carbohydrate   atropine 1 % ophthalmic solution Place 1 drop into the left eye 2 (two) times daily.   No facility-administered encounter medications on file as of 07/15/2023.    Allergies (verified) Baclofen and Morphine and codeine   History: Past Medical History:  Diagnosis Date   AKI (acute kidney injury) (HCC)    Allergy    Anxiety    Arthritis     Right shoulder   Atrial fibrillation (HCC)    Carotid artery stenosis    Cataract    CHF (congestive heart failure) (HCC)    CVA (cerebral vascular accident) (HCC) 04/28/2016   Decreased vision    left eye   Diabetes mellitus without complication (HCC)    Takes Metformin   Hyperlipidemia    Hypertension    Salmonella bacteremia 04/29/2016   Sepsis (HCC) 04/29/2016   Stroke (HCC) 11/18/2015   no deficits   TIA (transient ischemic attack)    affected left eye   Urgency of urination    Past Surgical History:  Procedure Laterality Date   AORTIC VALVE REPLACEMENT     BACK SURGERY  80s   CARDIAC VALVE REPLACEMENT  11/01/2008   21-mm Edwards Pericardial Magna - Ease valve   ENDARTERECTOMY Right 01/13/2016   Procedure: ENDARTERECTOMY CAROTID - RIGHT;  Surgeon: Fransisco Hertz, MD;  Location: [facility] OR;  Service: Vascular;  Laterality: Right;   EYE SURGERY     laser surgery, left eye  Left 10-29-2015    retinal surgery/ Fawn Kirk MD    PERIPHERAL VASCULAR CATHETERIZATION Right 11/18/2015   Procedure: Carotid Angiography;  Surgeon: Fransisco Hertz, MD;  Location: [facility] INVASIVE CV LAB;  Service: Cardiovascular;  Laterality: Right;   PERIPHERAL VASCULAR CATHETERIZATION N/A 11/18/2015   Procedure: Aortic Arch Angiography;  Surgeon: Fransisco Hertz, MD;  Location: [facility] INVASIVE CV LAB;  Service: Cardiovascular;  Laterality: N/A;   TEE WITHOUT CARDIOVERSION N/A 05/05/2016   Procedure: TRANSESOPHAGEAL ECHOCARDIOGRAM (TEE);  Surgeon: Lewayne Bunting, MD;  Location: [facility] ENDOSCOPY;  Service: Cardiovascular;  Laterality: N/A;   Family History  Problem Relation Age of Onset   Heart disease Mother    Cancer Mother        breast   Stroke Father 34   Hypertension Father    Early death Sister        infant death   Heart disease Brother    Hydrocephalus Brother    Heart disease Brother    Diabetes Brother    Cancer Brother        prostat   Healthy Son    Healthy Son    Social History   Socioeconomic  History   Marital status: Married    Spouse name: [name]   Number of children: 2   Years of education: 11   Highest education level: 11th grade  Occupational History   Occupation: maintenance    Employer: [employer]    Comment: Retired  Tobacco Use   Smoking status: Former    Current packs/day: 0.00    Average packs/day: 2.0 packs/day for 20.0 years (40.0 ttl pk-yrs)    Types: Cigarettes    Start date: 07/20/1966    Quit date: 07/20/1986  Years since quitting: 37.0   Smokeless tobacco: Never  Vaping Use   Vaping status: Never Used  Substance and Sexual Activity   Alcohol use: No    Alcohol/week: 0.0 standard drinks of alcohol   Drug use: No   Sexual activity: Yes  Other Topics Concern   Not on file  Social History Narrative   Lives with wife in home   Right Handed   Drinks 6-7 cups caffeine daily   Social Drivers of Health   Financial Resource Strain: Low Risk  (07/15/2023)   Overall Financial Resource Strain (CARDIA)    Difficulty of Paying Living Expenses: Not hard at all  Food Insecurity: No Food Insecurity (07/15/2023)   Hunger Vital Sign    Worried About Running Out of Food in the Last Year: Never true    Ran Out of Food in the Last Year: Never true  Transportation Needs: No Transportation Needs (07/15/2023)   PRAPARE - Administrator, Civil Service (Medical): No    Lack of Transportation (Non-Medical): No  Physical Activity: Inactive (07/15/2023)   Exercise Vital Sign    Days of Exercise per Week: 0 days    Minutes of Exercise per Session: 0 min  Stress: No Stress Concern Present (07/15/2023)   [employer] of Occupational Health - Occupational Stress Questionnaire    Feeling of Stress : Not at all  Social Connections: Moderately Isolated (07/15/2023)   Social Connection and Isolation Panel [NHANES]    Frequency of Communication with Friends and Family: More than three times a week    Frequency of Social Gatherings with Friends and Family: Three  times a week    Attends Religious Services: Never    Active Member of Clubs or Organizations: No    Attends Engineer, structural: Never    Marital Status: Married    Tobacco Counseling Counseling given: Not Answered   Clinical Intake:  Pre-visit preparation completed: Yes  Pain : No/denies pain     Diabetes: Yes CBG done?: No Did pt. bring in CBG monitor from home?: No  How often do you need to have someone help you when you read instructions, pamphlets, or other written materials from your doctor or pharmacy?: 1 - Never  Interpreter Needed?: No  Comments: Assisted with visit by wife Information entered by :: Kandis Fantasia LPN   Activities of Daily Living    07/15/2023   10:49 AM  In your present state of health, do you have any difficulty performing the following activities:  Hearing? 0  Vision? 0  Difficulty concentrating or making decisions? 1  Walking or climbing stairs? 0  Dressing or bathing? 0  Doing errands, shopping? 1  Preparing Food and eating ? N  Using the Toilet? N  In the past six months, have you accidently leaked urine? N  Do you have problems with loss of bowel control? N  Managing your Medications? Y  Managing your Finances? Y  Housekeeping or managing your Housekeeping? N    Patient Care Team: Raliegh Ip, DO as PCP - General (Family Medicine) Luciana Axe Alford Highland, MD as Consulting Physician (Ophthalmology) Ernesto Rutherford, MD as Consulting Physician (Ophthalmology) Reather Littler, MD (Inactive) as Consulting Physician (Endocrinology) Danella Maiers, [facility] (Pharmacist) Levert Feinstein, MD as Consulting Physician (Neurology) [facility] Adam Phenix, DPM as Consulting Physician (Podiatry) Cresenciano Genre, Lilla Shook, [facility] as [facility] (Pharmacist) Pricilla Riffle, MD as Consulting Physician (Cardiology) [place], [place], MD as Consulting  Physician (Endocrinology) Randa Lynn, MD as Consulting  Physician (Nephrology)  Indicate any recent Medical Services you may have received from other than [facility] providers in the past year (date may be approximate).     Assessment:   This is a routine wellness examination for Welch.  Hearing/Vision screen Hearing Screening - Comments:: Some hearing loss Vision Screening - Comments:: Wears rx glasses - up to date with routine eye exams with [facility] and Dr. Luciana Axe     Goals Addressed             This Visit's Progress    COMPLETED: AWV       05/24/2019 AWV Goal: Exercise for General Health  Patient will verbalize understanding of the benefits of increased physical activity: Exercising regularly is important. It will improve your overall fitness, flexibility, and endurance. Regular exercise also will improve your overall health. It can help you control your weight, reduce stress, and improve your bone density. Over the next year, patient will increase physical activity as tolerated with a goal of at least 150 minutes of moderate physical activity per week.  You can tell that you are exercising at a moderate intensity if your heart starts beating faster and you start breathing faster but can still hold a conversation. Moderate-intensity exercise ideas include: Walking 1 mile (1.6 km) in about 15 minutes Biking Hiking Golfing Dancing Water aerobics Patient will verbalize understanding of everyday activities that increase physical activity by providing examples like the following: Yard work, such as: Insurance underwriter Gardening Washing windows or floors Patient will be able to explain general safety guidelines for exercising:  Before you start a new exercise program, talk with your health care provider. Do not exercise so much that you hurt yourself, feel dizzy, or get very short of breath. Wear comfortable clothes and wear shoes with good  support. Drink plenty of water while you exercise to prevent dehydration or heat stroke. Work out until your breathing and your heartbeat get faster.  05/24/2019 AWV Goal: Fall Prevention  Over the next year, patient will decrease their risk for falls by: Using assistive devices, such as a cane or walker, as needed Identifying fall risks within their home and correcting them by: Removing throw rugs Adding handrails to stairs or ramps Removing clutter and keeping a clear pathway throughout the home Increasing light, especially at night Adding shower handles/bars Raising toilet seat Identifying potential personal risk factors for falls: Medication side effects Incontinence/urgency Vestibular dysfunction Hearing loss Musculoskeletal disorders Neurological disorders Orthostatic hypotension       COMPLETED: Patient Stated       07/02/2020 AWV Goal: Fall Prevention  Over the next year, patient will decrease their risk for falls by: Using assistive devices, such as a cane or walker, as needed Identifying fall risks within their home and correcting them by: Removing throw rugs Adding handrails to stairs or ramps Removing clutter and keeping a clear pathway throughout the home Increasing light, especially at night Adding shower handles/bars Raising toilet seat Identifying potential personal risk factors for falls: Medication side effects Incontinence/urgency Vestibular dysfunction Hearing loss Musculoskeletal disorders Neurological disorders Orthostatic hypotension        Depression Screen    07/15/2023   10:51 AM 01/13/2023    9:10 AM 07/08/2022    9:32 AM 07/06/2022    1:26 PM 03/02/2022    9:43 AM 10/29/2021    8:36 AM  07/03/2021    2:00 PM  PHQ 2/9 Scores  PHQ - 2 Score 0 0 0 0 1 0 0  PHQ- 9 Score  0   1  2    Fall Risk    07/15/2023   10:56 AM 01/13/2023    9:10 AM 07/08/2022    9:32 AM 07/06/2022    1:25 PM 03/02/2022    9:43 AM  Fall Risk   Falls in  the past year? 0 0 0 0 1  Number falls in past yr: 0   0 1  Injury with Fall? 0   0 1  Risk for fall due to : Impaired balance/gait   No Fall Risks History of fall(s)  Follow up Falls prevention discussed;Education provided;Falls evaluation completed Falls evaluation completed  Falls prevention discussed Education provided    MEDICARE RISK AT HOME: Medicare Risk at Home Any stairs in or around the home?: No If so, are there any without handrails?: No Home free of loose throw rugs in walkways, pet beds, electrical cords, etc?: Yes Adequate lighting in your home to reduce risk of falls?: Yes Life alert?: No Use of a cane, walker or w/c?: No Grab bars in the bathroom?: Yes Shower chair or bench in shower?: No Elevated toilet seat or a handicapped toilet?: Yes  TIMED UP AND GO:  Was the test performed?  No    Cognitive Function:    09/08/2022   11:11 AM 07/08/2022    9:32 AM 10/29/2021    9:08 AM 11/10/2017    9:20 AM 11/04/2016    8:52 AM  MMSE - Mini Mental State Exam  Orientation to time 5 5 5 5 4   Orientation to Place 5 5 5 5 5   Registration 3 3 3 3 3   Attention/ Calculation 3 4 3  0 1  Attention/Calculation-comments    not attempted   Recall 3 2 2 2 3   Language- name 2 objects 2 2 2 2 2   Language- repeat 1 1 1 1 1   Language- follow 3 step command 3 3 3 3 3   Language- read & follow direction 1 1 1 1 1   Write a sentence 1 0 1 1 1   Copy design 1 1 1 1 1   Total score 28 27 27 24 25       03/18/2023   11:22 AM 09/08/2022   11:23 AM 03/04/2022   11:57 AM 04/14/2021    5:00 PM  Montreal Cognitive Assessment   Visuospatial/ Executive (0/5) 3 3 3 1   Naming (0/3) 2 2 3 3   Attention: Read list of digits (0/2) 2 2 2 2   Attention: Read list of letters (0/1) 0 0 1 1  Attention: Serial 7 subtraction starting at 100 (0/3) 1 3 0 0  Language: Repeat phrase (0/2) 1 0 1 1  Language : Fluency (0/1) 0 0 1 0  Abstraction (0/2) 1 0 0 0  Delayed Recall (0/5) 1 0 0 0  Orientation (0/6) 5 6  6 5   Total 16 16 17 13       07/15/2023   10:56 AM 07/06/2022    1:29 PM 07/03/2021    2:08 PM 07/02/2020    1:31 PM 05/24/2019    9:22 AM  6CIT Screen  What Year? 0 points 0 points 0 points 0 points   What month? 0 points 0 points 0 points    What time? 0 points 0 points 0 points 0 points 0 points  Count back from 20  0 points 0 points 0 points 0 points 0 points  Months in reverse 2 points 0 points 2 points 0 points 0 points  Repeat phrase 2 points 0 points 2 points 0 points 0 points  Total Score 4 points 0 points 4 points      Immunizations Immunization History  Administered Date(s) Administered   Fluad Quad(high Dose 65+) 05/23/2019, 04/25/2020, 07/08/2022   Influenza Split 04/27/2016   Influenza Whole 05/26/2010   Influenza, High Dose Seasonal PF 05/20/2017, 05/23/2018   Influenza,inj,Quad PF,6+ Mos 06/14/2013, 04/16/2014, 06/18/2015   Influenza-Unspecified 05/14/2016, 05/20/2017, 05/23/2018   Moderna Sars-Covid-2 Vaccination 08/16/2019, 09/13/2019, 07/04/2020   Pneumococcal Conjugate-13 06/18/2015   Pneumococcal Polysaccharide-23 12/16/2016   Td 07/08/2022   Tdap 03/05/2011   Zoster Recombinant(Shingrix) 01/13/2023    TDAP status: Up to date  Flu Vaccine status: Due, Education has been provided regarding the importance of this vaccine. Advised may receive this vaccine at local pharmacy or Health Dept. Aware to provide a copy of the vaccination record if obtained from local pharmacy or Health Dept. Verbalized acceptance and understanding.  Pneumococcal vaccine status: Up to date  Covid-19 vaccine status: Information provided on how to obtain vaccines.   Qualifies for Shingles Vaccine? Yes   Zostavax completed No   Shingrix Completed?: No.    Education has been provided regarding the importance of this vaccine. Patient has been advised to call insurance company to determine out of pocket expense if they have not yet received this vaccine. Advised may also receive  vaccine at local pharmacy or Health Dept. Verbalized acceptance and understanding.  Screening Tests Health Maintenance  Topic Date Due   Zoster Vaccines- Shingrix (2 of 2) 03/10/2023   COVID-19 Vaccine (4 - 2024-25 season) 03/21/2023   FOOT EXAM  07/09/2023   INFLUENZA VACCINE  10/18/2023 (Originally 02/18/2023)   OPHTHALMOLOGY EXAM  09/30/2023   HEMOGLOBIN A1C  11/17/2023   Diabetic kidney evaluation - eGFR measurement  05/19/2024   Diabetic kidney evaluation - Urine ACR  05/19/2024   Medicare Annual Wellness (AWV)  07/14/2024   DTaP/Tdap/Td (3 - Td or Tdap) 07/08/2032   Pneumonia Vaccine 72+ Years old  Completed   HPV VACCINES  Aged Out   Colonoscopy  Discontinued   Hepatitis C Screening  Discontinued    Health Maintenance  Health Maintenance Due  Topic Date Due   Zoster Vaccines- Shingrix (2 of 2) 03/10/2023   COVID-19 Vaccine (4 - 2024-25 season) 03/21/2023   FOOT EXAM  07/09/2023    Colorectal cancer screening: No longer required.   Lung Cancer Screening: (Low Dose CT Chest recommended if Age 50-80 years, 20 pack-year currently smoking OR have quit w/in 15years.) does not qualify.   Lung Cancer Screening Referral: n/a  Additional Screening:  Hepatitis C Screening: does not qualify  Vision Screening: Recommended annual ophthalmology exams for early detection of glaucoma and other disorders of the eye. Is the patient up to date with their annual eye exam?  Yes  Who is the provider or what is the name of the office in which the patient attends annual eye exams? [facility] If pt is not established with a provider, would they like to be referred to a provider to establish care? No .   Dental Screening: Recommended annual dental exams for proper oral hygiene  Diabetic Foot Exam: Diabetic Foot Exam: Overdue, Pt has been advised about the importance in completing this exam. Pt is scheduled for diabetic foot exam on at next office visit.  Community Resource Referral /  Chronic Care Management: CRR required this visit?  No   CCM required this visit?  No     Plan:     I have personally reviewed and noted the following in the patient's chart:   Medical and social history Use of alcohol, tobacco or illicit drugs  Current medications and supplements including opioid prescriptions. Patient is not currently taking opioid prescriptions. Functional ability and status Nutritional status Physical activity Advanced directives List of other physicians Hospitalizations, surgeries, and ER visits in previous 12 months Vitals Screenings to include cognitive, depression, and falls Referrals and appointments  In addition, I have reviewed and discussed with patient certain preventive protocols, quality metrics, and best practice recommendations. A written personalized care plan for preventive services as well as general preventive health recommendations were provided to patient.     Kandis Fantasia [place], [place]   [id number]   After Visit Summary: (MyChart) Due to this being a telephonic visit, the after visit summary with patients personalized plan was offered to patient via MyChart   Nurse Notes: No concerns at this time

## 2023-07-15 NOTE — Patient Instructions (Signed)
Mr. Marasigan , Thank you for taking time to come for your Medicare Wellness Visit. I appreciate your ongoing commitment to your health goals. Please review the following plan we discussed and let me know if I can assist you in the future.   Referrals/Orders/Follow-Ups/Clinician Recommendations: Aim for 30 minutes of exercise or brisk walking, 6-8 glasses of water, and 5 servings of fruits and vegetables each day.  This is a list of the screening recommended for you and due dates:  Health Maintenance  Topic Date Due   Zoster (Shingles) Vaccine (2 of 2) 03/10/2023   COVID-19 Vaccine (4 - 2024-25 season) 03/21/2023   Complete foot exam   07/09/2023   Flu Shot  10/18/2023*   Eye exam for diabetics  09/30/2023   Hemoglobin A1C  11/17/2023   Yearly kidney function blood test for diabetes  05/19/2024   Yearly kidney health urinalysis for diabetes  05/19/2024   Medicare Annual Wellness Visit  07/14/2024   DTaP/Tdap/Td vaccine (3 - Td or Tdap) 07/08/2032   Pneumonia Vaccine  Completed   HPV Vaccine  Aged Out   Colon Cancer Screening  Discontinued   Hepatitis C Screening  Discontinued  *Topic was postponed. The date shown is not the original due date.    Advanced directives: (ACP Link)Information on Advanced Care Planning can be found at [facility] [place] Advance Health Care Directives Advance Health Care Directives ([email or web address]   Next Medicare Annual Wellness Visit scheduled for next year: Yes

## 2023-07-18 ENCOUNTER — Other Ambulatory Visit: Payer: Self-pay | Admitting: Family Medicine

## 2023-07-18 DIAGNOSIS — E1169 Type 2 diabetes mellitus with other specified complication: Secondary | ICD-10-CM

## 2023-07-18 DIAGNOSIS — I152 Hypertension secondary to endocrine disorders: Secondary | ICD-10-CM

## 2023-07-19 ENCOUNTER — Other Ambulatory Visit: Payer: Medicare Other

## 2023-07-19 ENCOUNTER — Encounter: Payer: Self-pay | Admitting: Family Medicine

## 2023-07-19 ENCOUNTER — Ambulatory Visit: Payer: Medicare Other | Admitting: Family Medicine

## 2023-07-19 VITALS — BP 97/56 | HR 68 | Temp 98.7°F | Ht 65.0 in | Wt 177.0 lb

## 2023-07-19 DIAGNOSIS — N1832 Chronic kidney disease, stage 3b: Secondary | ICD-10-CM | POA: Diagnosis not present

## 2023-07-19 DIAGNOSIS — Z794 Long term (current) use of insulin: Secondary | ICD-10-CM | POA: Diagnosis not present

## 2023-07-19 DIAGNOSIS — I480 Paroxysmal atrial fibrillation: Secondary | ICD-10-CM

## 2023-07-19 DIAGNOSIS — E1159 Type 2 diabetes mellitus with other circulatory complications: Secondary | ICD-10-CM

## 2023-07-19 DIAGNOSIS — E1142 Type 2 diabetes mellitus with diabetic polyneuropathy: Secondary | ICD-10-CM

## 2023-07-19 DIAGNOSIS — L570 Actinic keratosis: Secondary | ICD-10-CM

## 2023-07-19 DIAGNOSIS — I129 Hypertensive chronic kidney disease with stage 1 through stage 4 chronic kidney disease, or unspecified chronic kidney disease: Secondary | ICD-10-CM | POA: Diagnosis not present

## 2023-07-19 DIAGNOSIS — I5032 Chronic diastolic (congestive) heart failure: Secondary | ICD-10-CM | POA: Diagnosis not present

## 2023-07-19 DIAGNOSIS — E1122 Type 2 diabetes mellitus with diabetic chronic kidney disease: Secondary | ICD-10-CM

## 2023-07-19 DIAGNOSIS — E785 Hyperlipidemia, unspecified: Secondary | ICD-10-CM

## 2023-07-19 DIAGNOSIS — D696 Thrombocytopenia, unspecified: Secondary | ICD-10-CM | POA: Diagnosis not present

## 2023-07-19 DIAGNOSIS — E1169 Type 2 diabetes mellitus with other specified complication: Secondary | ICD-10-CM

## 2023-07-19 DIAGNOSIS — I152 Hypertension secondary to endocrine disorders: Secondary | ICD-10-CM

## 2023-07-19 DIAGNOSIS — Z23 Encounter for immunization: Secondary | ICD-10-CM | POA: Diagnosis not present

## 2023-07-19 DIAGNOSIS — E1129 Type 2 diabetes mellitus with other diabetic kidney complication: Secondary | ICD-10-CM | POA: Diagnosis not present

## 2023-07-19 LAB — PROTEIN / CREATININE RATIO, URINE: Creatinine, Urine: 99.5

## 2023-07-19 MED ORDER — APIXABAN 5 MG PO TABS: 5.0000 mg | ORAL_TABLET | Freq: Two times a day (BID) | ORAL | 3 refills | Status: DC

## 2023-07-19 MED ORDER — HYDROCHLOROTHIAZIDE 25 MG PO TABS: 25.0000 mg | ORAL_TABLET | Freq: Every day | ORAL | 3 refills | Status: DC

## 2023-07-19 MED ORDER — ATORVASTATIN CALCIUM 40 MG PO TABS: 40.0000 mg | ORAL_TABLET | Freq: Every day | ORAL | 3 refills | Status: DC

## 2023-07-19 MED ORDER — LISINOPRIL 20 MG PO TABS: 20.0000 mg | ORAL_TABLET | Freq: Every day | ORAL | 3 refills | Status: DC

## 2023-07-19 MED ORDER — METOPROLOL SUCCINATE ER 100 MG PO TB24: ORAL_TABLET | ORAL | 3 refills | Status: DC

## 2023-07-19 NOTE — Progress Notes (Signed)
Subjective: CC:DM PCP: Raliegh Ip, DO JXB:JYNWGN Evan Stanley is a 82 y.o. male who is accompanied today the by his wife.  He is presenting to clinic today for:  1. Type 2 Diabetes with hypertension, hyperlipidemia and CKD:  Managed by endocrinology with last OV 05/20/2023.  Will be seeing him again in January.  Sees Dr Wolfgang Phoenix soon and needs to get his labs today.   Diabetes Health Maintenance Due  Topic Date Due   FOOT EXAM  07/09/2023   OPHTHALMOLOGY EXAM  09/30/2023   HEMOGLOBIN A1C  11/17/2023    Last A1c:  Lab Results  Component Value Date   HGBA1C 7.4 (A) 05/20/2023    ROS: No chest pain, shortness of breath or falls reported.  Feels like his blood sugars typically more elevated in the morning than it is in the evening and wants to know why this is.  2.  Skin lesions His wife is concerned about the multiple crusty skin lesions he has on bilateral upper extremities.  No spontaneous bleeding.  He admits he does not moisturize well.   ROS: Per HPI  Allergies  Allergen Reactions   Baclofen Other (See Comments)    Stroke like symptoms   Morphine And Codeine Other (See Comments)    Delirium    Past Medical History:  Diagnosis Date   AKI (acute kidney injury) (HCC)    Allergy    Anxiety    Arthritis    Right shoulder   Atrial fibrillation (HCC)    Carotid artery stenosis    Cataract    Cerebrovascular accident (CVA) (HCC) 04/14/2021   CHF (congestive heart failure) (HCC)    CVA (cerebral vascular accident) (HCC) 04/28/2016   Decreased vision    left eye   Diabetes mellitus without complication (HCC)    Takes Metformin   Family history of epilepsy and other diseases of the nervous system 05/13/2019   History of TIA (transient ischemic attack) 05/13/2019   Hyperlipidemia    Hypertension    Salmonella bacteremia 04/29/2016   Sepsis (HCC) 04/29/2016   Stroke (HCC) 11/18/2015   no deficits   TIA (transient ischemic attack)    affected left eye    Urgency of urination     Current Outpatient Medications:    acetaminophen (TYLENOL) 325 MG tablet, Take 2 tablets (650 mg total) by mouth every 6 (six) hours as needed for mild pain (or Fever >/= 101)., Disp: , Rfl:    atorvastatin (LIPITOR) 40 MG tablet, TAKE 1 TABLET BY MOUTH DAILY, Disp: 100 tablet, Rfl: 0   atropine 1 % ophthalmic solution, Place 1 drop into the left eye 2 (two) times daily., Disp: 3 mL, Rfl: 11   Blood Glucose Monitoring Suppl (ONETOUCH VERIO) w/Device KIT, Use 1-4 times daily as needed/directed DX E11.9, Disp: 1 kit, Rfl: 0   cetirizine (ZYRTEC) 10 MG tablet, Take 1 tablet by mouth as needed for allergies., Disp: , Rfl:    cholecalciferol (VITAMIN D3) 25 MCG (1000 UNIT) tablet, Take 1,000 Units by mouth daily., Disp: , Rfl:    cyanocobalamin 1000 MCG tablet, Take 1,000 mcg by mouth daily., Disp: , Rfl:    Dulaglutide (TRULICITY) 1.5 MG/0.5ML SOPN, Inject 1.5 mg into the skin once a week. Inject the contents of one pen once per week, Disp: 6 mL, Rfl: 5   ELIQUIS 5 MG TABS tablet, TAKE 1 TABLET BY MOUTH TWICE  DAILY, Disp: 200 tablet, Rfl: 0   hydrochlorothiazide (HYDRODIURIL) 25 MG tablet, Take  1 tablet (25 mg total) by mouth daily., Disp: 100 tablet, Rfl: 3   insulin aspart (NOVOLOG) 100 UNIT/ML injection, Inject 9 Units into the skin in the morning and at bedtime. Pt takes 9 units before breakfast, and 8 units before supper., Disp: , Rfl:    insulin degludec (TRESIBA FLEXTOUCH) 200 UNIT/ML FlexTouch Pen, Inject 52 Units into the skin daily., Disp: , Rfl:    Insulin Pen Needle 32G X 4 MM MISC, Use 4x a day, Disp: 300 each, Rfl: 3   latanoprost (XALATAN) 0.005 % ophthalmic solution, Place 1 drop into the left eye at bedtime. , Disp: , Rfl:    lisinopril (ZESTRIL) 20 MG tablet, TAKE 1 TABLET BY MOUTH DAILY, Disp: 100 tablet, Rfl: 0   memantine (NAMENDA) 10 MG tablet, Take 1 tablet (10 mg total) by mouth 2 (two) times daily., Disp: 180 tablet, Rfl: 3   metoprolol succinate  (TOPROL-XL) 100 MG 24 hr tablet, TAKE 1 TABLET BY MOUTH DAILY  WITH OR IMMEDIATELY FOLLOWING A  MEAL, Disp: 100 tablet, Rfl: 0   ONETOUCH VERIO test strip, Use 1-4 times daily as directed/needed   DX E11.9, Disp: 200 each, Rfl: 12   UNABLE TO FIND, Diet: NAS and consistent carbohydrate, Disp: , Rfl:  Social History   Socioeconomic History   Marital status: Married    Spouse name: [name]   Number of children: 2   Years of education: 11   Highest education level: 11th grade  Occupational History   Occupation: maintenance    Employer: [employer]    Comment: Retired  Tobacco Use   Smoking status: Former    Current packs/day: 0.00    Average packs/day: 2.0 packs/day for 20.0 years (40.0 ttl pk-yrs)    Types: Cigarettes    Start date: 07/20/1966    Quit date: 07/20/1986    Years since quitting: 37.0   Smokeless tobacco: Never  Vaping Use   Vaping status: Never Used  Substance and Sexual Activity   Alcohol use: No    Alcohol/week: 0.0 standard drinks of alcohol   Drug use: No   Sexual activity: Yes  Other Topics Concern   Not on file  Social History Narrative   Lives with wife in home   Right Handed   Drinks 6-7 cups caffeine daily   Social Drivers of Health   Financial Resource Strain: Low Risk  (07/15/2023)   Overall Financial Resource Strain (CARDIA)    Difficulty of Paying Living Expenses: Not hard at all  Food Insecurity: No Food Insecurity (07/15/2023)   Hunger Vital Sign    Worried About Running Out of Food in the Last Year: Never true    Ran Out of Food in the Last Year: Never true  Transportation Needs: No Transportation Needs (07/15/2023)   PRAPARE - Administrator, Civil Service (Medical): No    Lack of Transportation (Non-Medical): No  Physical Activity: Inactive (07/15/2023)   Exercise Vital Sign    Days of Exercise per Week: 0 days    Minutes of Exercise per Session: 0 min  Stress: No Stress Concern Present (07/15/2023)   [employer] of  Occupational Health - Occupational Stress Questionnaire    Feeling of Stress : Not at all  Social Connections: Moderately Isolated (07/15/2023)   Social Connection and Isolation Panel [NHANES]    Frequency of Communication with Friends and Family: More than three times a week    Frequency of Social Gatherings with Friends and Family: Three times a  week    Attends Religious Services: Never    Active Member of Clubs or Organizations: No    Attends Banker Meetings: Never    Marital Status: Married  Catering manager Violence: Not At Risk (07/15/2023)   Humiliation, Afraid, Rape, and Kick questionnaire    Fear of Current or Ex-Partner: No    Emotionally Abused: No    Physically Abused: No    Sexually Abused: No   Family History  Problem Relation Age of Onset   Heart disease Mother    Cancer Mother        breast   Stroke Father 35   Hypertension Father    Early death Sister        infant death   Heart disease Brother    Hydrocephalus Brother    Heart disease Brother    Diabetes Brother    Cancer Brother        prostat   Healthy Son    Healthy Son     Objective: Office vital signs reviewed. BP (!) 97/56   Pulse 68   Temp 98.7 F (37.1 C)   Ht 5\' 5"  (1.651 m)   Wt 177 lb (80.3 kg)   SpO2 95%   BMI 29.45 kg/m   Physical Examination:  General: Awake, alert, nontoxic elderly male, No acute distress HEENT: Moist mucous membranes.  Slight ptosis noted of the lids Cardio: regular rate and rhythm, S1S2 heard Pulm: clear to auscultation bilaterally, no wheezes, rhonchi or rales; normal work of breathing on room air Skin: Multiple actinic keratosis and areas of hyperpigmentation noted along the forearms and hands bilaterally MSK: Ambulates with use of cane.  Assessment/ Plan: 82 y.o. male   Type 2 diabetes mellitus with stage 3b chronic kidney disease, with long-term current use of insulin (HCC)  Hyperlipidemia associated w/ type 2 DM goal <70 - Plan:  atorvastatin (LIPITOR) 40 MG tablet, Lipid Panel  Diabetic peripheral neuropathy associated with type 2 diabetes mellitus (HCC)  Hypertension associated with diabetes (HCC) - Plan: lisinopril (ZESTRIL) 20 MG tablet, metoprolol succinate (TOPROL-XL) 100 MG 24 hr tablet  Encounter for immunization - Plan: Flu Vaccine Trivalent High Dose (Fluad)  Paroxysmal A-fib (HCC) - Plan: apixaban (ELIQUIS) 5 MG TABS tablet  Actinic keratoses  Continue to follow-up with endocrinology as scheduled.  Fasting lipid ordered.  Statin renewed.  Blood pressure is controlled.  No changes needed.  Renewals of medicine sent  Influenza vaccination administered  Eliquis renewed.  All meds [facility]  Discussed lesions of concern are actinic keratoses and I offered cryoablation today but he declined this and wants to hold off till next visit.  We discussed the potential for squamous cell carcinoma transition  Raliegh Ip, DO [facility] [phone]

## 2023-08-18 ENCOUNTER — Encounter: Payer: Self-pay | Admitting: Endocrinology

## 2023-08-18 ENCOUNTER — Ambulatory Visit: Payer: HMO | Admitting: Endocrinology

## 2023-08-18 VITALS — BP 108/70 | HR 70 | Resp 20 | Ht 65.0 in | Wt 177.0 lb

## 2023-08-18 DIAGNOSIS — I129 Hypertensive chronic kidney disease with stage 1 through stage 4 chronic kidney disease, or unspecified chronic kidney disease: Secondary | ICD-10-CM | POA: Diagnosis not present

## 2023-08-18 DIAGNOSIS — N183 Chronic kidney disease, stage 3 unspecified: Secondary | ICD-10-CM

## 2023-08-18 DIAGNOSIS — Z794 Long term (current) use of insulin: Secondary | ICD-10-CM | POA: Diagnosis not present

## 2023-08-18 DIAGNOSIS — E118 Type 2 diabetes mellitus with unspecified complications: Secondary | ICD-10-CM | POA: Diagnosis not present

## 2023-08-18 DIAGNOSIS — E1122 Type 2 diabetes mellitus with diabetic chronic kidney disease: Secondary | ICD-10-CM

## 2023-08-18 LAB — POCT GLYCOSYLATED HEMOGLOBIN (HGB A1C): Hemoglobin A1C: 7.1 % — AB (ref 4.0–5.6)

## 2023-08-18 NOTE — Progress Notes (Signed)
Outpatient Endocrinology Note Evan Zackry Deines, MD  08/18/23  Patient's Name: Evan Stanley    DOB: 07-15-1941    MRN: [id number]                                                    REASON OF VISIT: Follow up of type 2 diabetes mellitus  PCP: Raliegh Ip, DO  HISTORY OF PRESENT ILLNESS:   Evan Stanley is a 83 y.o. old male with past medical history listed below, is here for follow up for type 2 diabetes mellitus.   Pertinent Diabetes History: Patient was diagnosed with type 2 diabetes mellitus in 2010.  Chronic Diabetes Complications : Retinopathy: ? no. Last ophthalmology exam was done on annually, following with ophthalmology regularly.  Nephropathy: CKD IIIb on ACE/ARB / lisinopril, following with nephrology. Peripheral neuropathy: no Coronary artery disease: yes s/p CABG.  Stroke: yes  Relevant comorbidities and cardiovascular risk factors: Obesity: no Body mass index is 29.45 kg/m.  Hypertension: Yes  Hyperlipidemia : Yes, on statin   Current / Home Diabetic regimen includes: Tresiba 44 units daily  in the morning. NovoLog 9 units with breakfast and dinner. Trulicity  1.5 mg weekly.  Patient gets tresiba / Technical sales engineer / Trulicity from patient assistance.   Prior diabetic medications: Glimepiride  and metformin in the past.   Glycemic data:   He has refused to use CGM in the past.  He has One Touch Verio flex glucometer.  Glucometer data from January 15 to  August 17, 2022 average blood sugar 134, he has been checking 2-3 times a day.  Fasting blood sugar mostly acceptable in the range of 95-115 range.  Occasionally 140, 119.  Blood sugar in the evening after 2 hours of the supper mostly acceptable and rarely 191.  No hypoglycemia.  Lowest blood sugar 80.   Hypoglycemia: Patient has no hypoglycemic episodes. Patient has hypoglycemia awareness.  Factors modifying glucose control: 1.  Diabetic diet assessment: 2- 3 meals.  2.  Staying active or exercising:  Occasional golf playing.  3.  Medication compliance: compliant all of the time.  Interval history  Diabetes regimen reviewed and noted as above.  Hemoglobin A1c 7.1% today.  He usually takes 9 units of NovoLog with breakfast and supper.  No other complaints today.  REVIEW OF SYSTEMS As per history of present illness.   PAST MEDICAL HISTORY: Past Medical History:  Diagnosis Date   AKI (acute kidney injury) (HCC)    Allergy    Anxiety    Arthritis    Right shoulder   Atrial fibrillation (HCC)    Carotid artery stenosis    Cataract    Cerebrovascular accident (CVA) (HCC) 04/14/2021   CHF (congestive heart failure) (HCC)    CVA (cerebral vascular accident) (HCC) 04/28/2016   Decreased vision    left eye   Diabetes mellitus without complication (HCC)    Takes Metformin   Family history of epilepsy and other diseases of the nervous system 05/13/2019   History of TIA (transient ischemic attack) 05/13/2019   Hyperlipidemia    Hypertension    Salmonella bacteremia 04/29/2016   Sepsis (HCC) 04/29/2016   Stroke (HCC) 11/18/2015   no deficits   TIA (transient ischemic attack)    affected left eye   Urgency of urination     PAST SURGICAL  HISTORY: Past Surgical History:  Procedure Laterality Date   AORTIC VALVE REPLACEMENT     BACK SURGERY  80s   CARDIAC VALVE REPLACEMENT  11/01/2008   21-mm Edwards Pericardial Magna - Ease valve   ENDARTERECTOMY Right 01/13/2016   Procedure: ENDARTERECTOMY CAROTID - RIGHT;  Surgeon: Fransisco Hertz, MD;  Location: [facility] OR;  Service: Vascular;  Laterality: Right;   EYE SURGERY     laser surgery, left eye  Left 10-29-2015    retinal surgery/ Fawn Kirk MD    PERIPHERAL VASCULAR CATHETERIZATION Right 11/18/2015   Procedure: Carotid Angiography;  Surgeon: Fransisco Hertz, MD;  Location: [facility] INVASIVE CV LAB;  Service: Cardiovascular;  Laterality: Right;   PERIPHERAL VASCULAR CATHETERIZATION N/A 11/18/2015   Procedure: Aortic Arch Angiography;  Surgeon: Fransisco Hertz, MD;  Location: [facility] INVASIVE CV LAB;  Service: Cardiovascular;  Laterality: N/A;   TEE WITHOUT CARDIOVERSION N/A 05/05/2016   Procedure: TRANSESOPHAGEAL ECHOCARDIOGRAM (TEE);  Surgeon: Lewayne Bunting, MD;  Location: [facility] ENDOSCOPY;  Service: Cardiovascular;  Laterality: N/A;    ALLERGIES: Allergies  Allergen Reactions   Baclofen Other (See Comments)    Stroke like symptoms   Morphine And Codeine Other (See Comments)    Delirium     FAMILY HISTORY:  Family History  Problem Relation Age of Onset   Heart disease Mother    Cancer Mother        breast   Stroke Father 44   Hypertension Father    Early death Sister        infant death   Heart disease Brother    Hydrocephalus Brother    Heart disease Brother    Diabetes Brother    Cancer Brother        prostat   Healthy Son    Healthy Son     SOCIAL HISTORY: Social History   Socioeconomic History   Marital status: Married    Spouse name: [name]   Number of children: 2   Years of education: 11   Highest education level: 11th grade  Occupational History   Occupation: maintenance    Employer: [employer]    Comment: Retired  Tobacco Use   Smoking status: Former    Current packs/day: 0.00    Average packs/day: 2.0 packs/day for 20.0 years (40.0 ttl pk-yrs)    Types: Cigarettes    Start date: 07/20/1966    Quit date: 07/20/1986    Years since quitting: 37.1   Smokeless tobacco: Never  Vaping Use   Vaping status: Never Used  Substance and Sexual Activity   Alcohol use: No    Alcohol/week: 0.0 standard drinks of alcohol   Drug use: No   Sexual activity: Yes  Other Topics Concern   Not on file  Social History Narrative   Lives with wife in home   Right Handed   Drinks 6-7 cups caffeine daily   Social Drivers of Health   Financial Resource Strain: Low Risk  (07/15/2023)   Overall Financial Resource Strain (CARDIA)    Difficulty of Paying Living Expenses: Not hard at all  Food Insecurity: No Food Insecurity  (07/15/2023)   Hunger Vital Sign    Worried About Running Out of Food in the Last Year: Never true    Ran Out of Food in the Last Year: Never true  Transportation Needs: No Transportation Needs (07/15/2023)   PRAPARE - Administrator, Civil Service (Medical): No    Lack of Transportation (Non-Medical): No  Physical Activity: Inactive (07/15/2023)   Exercise Vital Sign    Days of Exercise per Week: 0 days    Minutes of Exercise per Session: 0 min  Stress: No Stress Concern Present (07/15/2023)   [employer] of Occupational Health - Occupational Stress Questionnaire    Feeling of Stress : Not at all  Social Connections: Moderately Isolated (07/15/2023)   Social Connection and Isolation Panel [NHANES]    Frequency of Communication with Friends and Family: More than three times a week    Frequency of Social Gatherings with Friends and Family: Three times a week    Attends Religious Services: Never    Active Member of Clubs or Organizations: No    Attends Banker Meetings: Never    Marital Status: Married    MEDICATIONS:  Current Outpatient Medications  Medication Sig Dispense Refill   acetaminophen (TYLENOL) 325 MG tablet Take 2 tablets (650 mg total) by mouth every 6 (six) hours as needed for mild pain (or Fever >/= 101).     apixaban (ELIQUIS) 5 MG TABS tablet Take 1 tablet (5 mg total) by mouth 2 (two) times daily. 200 tablet 3   atorvastatin (LIPITOR) 40 MG tablet Take 1 tablet (40 mg total) by mouth daily. 100 tablet 3   Blood Glucose Monitoring Suppl (ONETOUCH VERIO) w/Device KIT Use 1-4 times daily as needed/directed DX E11.9 1 kit 0   cetirizine (ZYRTEC) 10 MG tablet Take 1 tablet by mouth as needed for allergies.     cholecalciferol (VITAMIN D3) 25 MCG (1000 UNIT) tablet Take 1,000 Units by mouth daily.     cyanocobalamin 1000 MCG tablet Take 1,000 mcg by mouth daily.     Dulaglutide (TRULICITY) 1.5 MG/0.5ML SOPN Inject 1.5 mg into the skin once a  week. Inject the contents of one pen once per week 6 mL 5   hydrochlorothiazide (HYDRODIURIL) 25 MG tablet Take 1 tablet (25 mg total) by mouth daily. 100 tablet 3   insulin aspart (NOVOLOG) 100 UNIT/ML injection Inject 9 Units into the skin in the morning and at bedtime. Pt takes 9 units before breakfast, and 8 units before supper.     insulin degludec (TRESIBA FLEXTOUCH) 200 UNIT/ML FlexTouch Pen Inject 52 Units into the skin daily.     Insulin Pen Needle 32G X 4 MM MISC Use 4x a day 300 each 3   latanoprost (XALATAN) 0.005 % ophthalmic solution Place 1 drop into the left eye at bedtime.      lisinopril (ZESTRIL) 20 MG tablet Take 1 tablet (20 mg total) by mouth daily. 100 tablet 3   memantine (NAMENDA) 10 MG tablet Take 1 tablet (10 mg total) by mouth 2 (two) times daily. 180 tablet 3   metoprolol succinate (TOPROL-XL) 100 MG 24 hr tablet TAKE 1 TABLET BY MOUTH  DAILY WITH OR IMMEDIATELY  FOLLOWING A MEAL 100 tablet 3   ONETOUCH VERIO test strip Use 1-4 times daily as directed/needed   DX E11.9 200 each 12   UNABLE TO FIND Diet: NAS and consistent carbohydrate     atropine 1 % ophthalmic solution Place 1 drop into the left eye 2 (two) times daily. 3 mL 11   No current facility-administered medications for this visit.    PHYSICAL EXAM: Vitals:   08/18/23 1103  BP: 108/70  Pulse: 70  Resp: 20  SpO2: 95%  Weight: 177 lb (80.3 kg)  Height: 5\' 5"  (1.651 m)   Body mass index is 29.45 kg/m.  Wt  Readings from Last 3 Encounters:  08/18/23 177 lb (80.3 kg)  07/19/23 177 lb (80.3 kg)  07/15/23 180 lb (81.6 kg)    General: Well developed, well nourished male in no apparent distress.  HEENT: AT/[place], no external lesions.  Eyes: Conjunctiva clear and no icterus. Neck: Neck supple  Lungs: Respirations not labored Neurologic: Alert, oriented, normal speech Extremities / Skin: Dry.  Psychiatric: Does not appear depressed or anxious  Diabetic Foot Exam - Simple   No data filed    LABS  Reviewed Lab Results  Component Value Date   HGBA1C 7.1 (A) 08/18/2023   HGBA1C 7.4 (A) 05/20/2023   HGBA1C 7.0 (A) 12/30/2022   Lab Results  Component Value Date   FRUCTOSAMINE 274 08/12/2020   FRUCTOSAMINE 323 (H) 04/22/2018   Lab Results  Component Value Date   CHOL 114 07/08/2022   HDL 40 07/08/2022   LDLCALC 56 07/08/2022   LDLDIRECT 62 02/16/2017   TRIG 97 07/08/2022   CHOLHDL 2.9 07/08/2022   Lab Results  Component Value Date   MICRALBCREAT 32.0 (H) 05/20/2023   MICRALBCREAT 13.5 08/12/2020   Lab Results  Component Value Date   CREATININE 2.01 (H) 05/20/2023   Lab Results  Component Value Date   GFR 30.80 (L) 05/20/2023    ASSESSMENT / PLAN  1. Type 2 DM with CKD stage 3 and hypertension (HCC)   2. Controlled type 2 diabetes mellitus with complication, with long-term current use of insulin (HCC)     Diabetes Mellitus type 2, complicated by diabetic nephropathy/CAD. - Diabetic status / severity: Fair control.  Lab Results  Component Value Date   HGBA1C 7.1 (A) 08/18/2023    - Hemoglobin A1c goal : <7.5%  - Medications: See below.  I) continue Tresiba 44 units daily. II) adjust NovoLog 6 to 10 units with meals with breakfast and dinner based on meal size. III) continue Trulicity 1.5 mg weekly.  Patient assistance for [place] and NovoLog for [employer] form completed last visit.  Patient reports he may get from the medical insurance going forward.  - Home glucose testing: Before meals and bedtime. - Discussed/ Gave Hypoglycemia treatment plan.  # Consult : not required at this time.   # Annual urine for microalbuminuria/ creatinine ratio, no microalbuminuria currently, continue ACE/ARB /lisinopril, will check today.  Patient has been following with nephrology for CKD 3B. Last  Lab Results  Component Value Date   MICRALBCREAT 32.0 (H) 05/20/2023    # Foot check nightly.  # Annual dilated diabetic eye exams.   - Diet: Make healthy  diabetic food choices - Life style / activity / exercise: Discussed.  2. Blood pressure  -  BP Readings from Last 1 Encounters:  08/18/23 108/70    - Control is in target.  - No change in current plans.   3. Lipid status / Hyperlipidemia - Last  Lab Results  Component Value Date   LDLCALC 56 07/08/2022   - Continue atorvastatin 40 mg daily.  Managed by PCP.    Diagnoses and all orders for this visit:  Type 2 DM with CKD stage 3 and hypertension (HCC) -     POCT glycosylated hemoglobin (Hb A1C)  Controlled type 2 diabetes mellitus with complication, with long-term current use of insulin (HCC) -     POCT glycosylated hemoglobin (Hb A1C)    DISPOSITION Follow up in clinic in 3 months suggested.   All questions answered and patient verbalized understanding of the plan.  Evan  Erroll Luna, MD [facility] Endocrinology [facility] [address] [place], [address] [place], [place] [address] Phone # [phone]  At least part of this note was generated using voice recognition software. Inadvertent word errors may have occurred, which were not recognized during the proofreading process.

## 2023-08-18 NOTE — Patient Instructions (Signed)
Tresiba 44 units daily  in the morning. NovoLog 6-10 units with breakfast and dinner. Trulicity  1.5 mg weekly.

## 2023-10-05 DIAGNOSIS — H2511 Age-related nuclear cataract, right eye: Secondary | ICD-10-CM | POA: Diagnosis not present

## 2023-10-05 DIAGNOSIS — E119 Type 2 diabetes mellitus without complications: Secondary | ICD-10-CM | POA: Diagnosis not present

## 2023-10-05 DIAGNOSIS — Z961 Presence of intraocular lens: Secondary | ICD-10-CM | POA: Diagnosis not present

## 2023-10-05 DIAGNOSIS — H4089 Other specified glaucoma: Secondary | ICD-10-CM | POA: Diagnosis not present

## 2023-10-05 DIAGNOSIS — H1812 Bullous keratopathy, left eye: Secondary | ICD-10-CM | POA: Diagnosis not present

## 2023-10-05 LAB — HM DIABETES EYE EXAM

## 2023-10-07 ENCOUNTER — Encounter: Payer: Self-pay | Admitting: Endocrinology

## 2023-10-12 ENCOUNTER — Other Ambulatory Visit: Payer: Self-pay | Admitting: Endocrinology

## 2023-10-12 ENCOUNTER — Other Ambulatory Visit: Payer: Self-pay

## 2023-10-12 DIAGNOSIS — E1165 Type 2 diabetes mellitus with hyperglycemia: Secondary | ICD-10-CM

## 2023-10-12 MED ORDER — TRESIBA FLEXTOUCH 200 UNIT/ML ~~LOC~~ SOPN: PEN_INJECTOR | SUBCUTANEOUS | 3 refills | Status: DC

## 2023-10-12 MED ORDER — INSULIN PEN NEEDLE 30G X 8 MM MISC: 1.0000 | Status: AC | PRN

## 2023-10-12 MED ORDER — INSULIN ASPART 100 UNIT/ML IJ SOLN: INTRAMUSCULAR | 3 refills | Status: DC

## 2023-10-12 MED ORDER — TRULICITY 1.5 MG/0.5ML ~~LOC~~ SOAJ: 1.5000 mg | SUBCUTANEOUS | 5 refills | Status: DC

## 2023-10-12 NOTE — Progress Notes (Unsigned)
 [facility] [address] [place]. [place] [address] [phone]       OFFICE FOLLOW UP NOTE  [name] Date of Birth:  17-May-1941 Medical Record Number:  [id number]    Primary neurologist: Dr. Terrace Arabia Reason for visit: Memory loss, hx of prior strokes    SUBJECTIVE:   CHIEF COMPLAINT:  No chief complaint on file.   HPI:    Update 10/13/2023 JM: Patient returns for follow-up visit after prior visit 7 months ago.  Overall stable since prior visit.  Reports cognition ***.   Remains on memantine 10 mg twice daily.  No new stroke/TIA symptoms.  Reports compliance on Eliquis and atorvastatin.  Routinely follows with PCP for stroke risk factor management.     History provided for reference purposes only Update 03/18/2023 JM: Patient returns for follow-up visit accompanied by his wife.  Reports cognition has been stable since prior visit.  MOCA today 16/30 (prior 16/30). Remains on Namenda 10 mg twice daily.  Can have occasional agitation but not as frequent or severe since being on Namenda.  Maintains ADLs independently.  Has returned back to driving after passing a DMV driving evaluation, reports driving short distances only, avoids nighttime or highway driving. Denies getting lost or any issues with driving.  Denies new stroke/TIA symptoms.  Remains on Eliquis and atorvastatin.  Routinely follows with cardiology and PCP.  Update 09/08/2022 JM: Patient returns for 20-month follow-up accompanied by his wife.  Was started on Namenda with gradual titration to 10 mg twice daily at prior visit for cognitive impairment with mood disturbance.  Wife notes improvement of mood and cognition since initiating Namenda.  His balance remains poor per wife and has frequent falls (will lose balance if making turns too quickly), thankfully without significant injury or hitting of head. Continued use of cane. Wife has been trying to encourage him to do exercises at home but he will  easily become agitated with her if she pushes him too much.   UPDATE 03/02/2022 JM: Patient returns for follow-up visit after prior visit with Dr. Terrace Arabia 7 months ago.  He is accompanied by his wife who provides majority of history.  Unfortunately, he has had some decline since prior visit especially with mood disturbance and sundowning which has worsened over the past month.  At times he can be physically aggressive or verbally abuse her.  He has poor balance which limits physical activity.  He has been advised not to drive but has still been driving short distance, his wife has not been able to take his keys away due to his aggression.  He is not currently on any medications for dementia nor medications for mood. He can have occasional agitation during the day but significantly worsened at night. Wife mentions he has difficulty tolerating sedating medications but is interested in starting on something for his memory.   He routinely follows with PCP, endocrinology with recent A1c 6.6, cardiology and nephrology  No further concerns at this time  update Aug 11 2021 Dr. Terrace Arabia: he is accompanied by, wife at today's visit, since last visit in September 2022, he had made significant progress, home physical therapy has been very helpful  He continues to have significant memory loss, loss of vision of left eye due to previous left retinal artery occlusion, he likes to drive short distance to golf course, to check with his friends, but is no longer able to play, mild unsteady gait  Personally reviewed MRI of the brain in  October 2022: Moderate generalized atrophy, scattered positive DWI lesion at the right frontal lobe, consistent with acute/subacute infarction, cerebral lacunar infarction in the cerebral hemisphere, pons, left cerebellar hemisphere, occlusion of left internal carotid artery, right vertebral artery, also was seen at previous MRI  He is taking Eliquis due to history of atrial  fibrillation,  Consult visit 04/14/2021 Dr. Terrace Arabia: Evan Stanley is a 83 year old male, seen in request by his primary care physician Dr. Nadine Counts, Kathie Rhodes for evaluation of memory loss, gait abnormality, initial evaluation with Dr. Terrace Arabia on April 14, 2021   I reviewed and summarized the referring note.PMHX. HTN DM since 2010 HLD Atrial Fibrillation-on eliquis Stroke in Oct 2017, left side weakness, recovered well, was able to go back driving, golf Right endarterectomy January 13, 2016 Aortic valve replacement, Left eye, retinal artery occlusion in 2015, lost vision of left eye. Peripheral vascular disease. Smoker in the past,   He had a multiple stroke in the past, had multiple vascular risk factor, atrial fibrillation, diabetes, hypertension, hyperlipidemia, aortic valve replacement, has mild residual left-sided weakness, gait abnormality from previous stroke, already have some baseline gait abnormality  Wife noticed that patient has worsening gait abnormality memory loss since his COVID infection in August 2022, he was severely hydrated, high fever for few days, had bad congestion, achy all over, but does not require hospitalization  Since then, he was noted to have increased left-sided weakness, also developed this flailed movement of left lower extremity, sometimes involving left upper extremity, also increased confusion, memory loss,   He retired from maintenance at age 77, prior to the COVID infection in August 2022, he was still able to drive short distance to golf course, play golf, now wife is worried about leaving him alone at  house for few hours,       ROS:   14 system review of systems performed and negative with exception of those listed in HPI  PMH:  Past Medical History:  Diagnosis Date   AKI (acute kidney injury) (HCC)    Allergy    Anxiety    Arthritis    Right shoulder   Atrial fibrillation (HCC)    Carotid artery stenosis    Cataract    Cerebrovascular  accident (CVA) (HCC) 04/14/2021   CHF (congestive heart failure) (HCC)    CVA (cerebral vascular accident) (HCC) 04/28/2016   Decreased vision    left eye   Diabetes mellitus without complication (HCC)    Takes Metformin   Family history of epilepsy and other diseases of the nervous system 05/13/2019   History of TIA (transient ischemic attack) 05/13/2019   Hyperlipidemia    Hypertension    Salmonella bacteremia 04/29/2016   Sepsis (HCC) 04/29/2016   Stroke (HCC) 11/18/2015   no deficits   TIA (transient ischemic attack)    affected left eye   Urgency of urination     PSH:  Past Surgical History:  Procedure Laterality Date   AORTIC VALVE REPLACEMENT     BACK SURGERY  80s   CARDIAC VALVE REPLACEMENT  11/01/2008   21-mm Edwards Pericardial Magna - Ease valve   ENDARTERECTOMY Right 01/13/2016   Procedure: ENDARTERECTOMY CAROTID - RIGHT;  Surgeon: Fransisco Hertz, MD;  Location: [facility] OR;  Service: Vascular;  Laterality: Right;   EYE SURGERY     laser surgery, left eye  Left 10-29-2015    retinal surgery/ Fawn Kirk MD    PERIPHERAL VASCULAR CATHETERIZATION Right 11/18/2015   Procedure: Carotid Angiography;  Surgeon:  Fransisco Hertz, MD;  Location: [facility] INVASIVE CV LAB;  Service: Cardiovascular;  Laterality: Right;   PERIPHERAL VASCULAR CATHETERIZATION N/A 11/18/2015   Procedure: Aortic Arch Angiography;  Surgeon: Fransisco Hertz, MD;  Location: [facility] INVASIVE CV LAB;  Service: Cardiovascular;  Laterality: N/A;   TEE WITHOUT CARDIOVERSION N/A 05/05/2016   Procedure: TRANSESOPHAGEAL ECHOCARDIOGRAM (TEE);  Surgeon: Lewayne Bunting, MD;  Location: [facility] ENDOSCOPY;  Service: Cardiovascular;  Laterality: N/A;    Social History:  Social History   Socioeconomic History   Marital status: Married    Spouse name: [name]   Number of children: 2   Years of education: 11   Highest education level: 11th grade  Occupational History   Occupation: maintenance    Employer: [employer]    Comment: Retired  Tobacco  Use   Smoking status: Former    Current packs/day: 0.00    Average packs/day: 2.0 packs/day for 20.0 years (40.0 ttl pk-yrs)    Types: Cigarettes    Start date: 07/20/1966    Quit date: 07/20/1986    Years since quitting: 37.2   Smokeless tobacco: Never  Vaping Use   Vaping status: Never Used  Substance and Sexual Activity   Alcohol use: No    Alcohol/week: 0.0 standard drinks of alcohol   Drug use: No   Sexual activity: Yes  Other Topics Concern   Not on file  Social History Narrative   Lives with wife in home   Right Handed   Drinks 6-7 cups caffeine daily   Social Drivers of Health   Financial Resource Strain: Low Risk  (07/15/2023)   Overall Financial Resource Strain (CARDIA)    Difficulty of Paying Living Expenses: Not hard at all  Food Insecurity: No Food Insecurity (07/15/2023)   Hunger Vital Sign    Worried About Running Out of Food in the Last Year: Never true    Ran Out of Food in the Last Year: Never true  Transportation Needs: No Transportation Needs (07/15/2023)   PRAPARE - Administrator, Civil Service (Medical): No    Lack of Transportation (Non-Medical): No  Physical Activity: Inactive (07/15/2023)   Exercise Vital Sign    Days of Exercise per Week: 0 days    Minutes of Exercise per Session: 0 min  Stress: No Stress Concern Present (07/15/2023)   [employer] of Occupational Health - Occupational Stress Questionnaire    Feeling of Stress : Not at all  Social Connections: Moderately Isolated (07/15/2023)   Social Connection and Isolation Panel [NHANES]    Frequency of Communication with Friends and Family: More than three times a week    Frequency of Social Gatherings with Friends and Family: Three times a week    Attends Religious Services: Never    Active Member of Clubs or Organizations: No    Attends Banker Meetings: Never    Marital Status: Married  Catering manager Violence: Not At Risk (07/15/2023)   Humiliation,  Afraid, Rape, and Kick questionnaire    Fear of Current or Ex-Partner: No    Emotionally Abused: No    Physically Abused: No    Sexually Abused: No    Family History:  Family History  Problem Relation Age of Onset   Heart disease Mother    Cancer Mother        breast   Stroke Father 53   Hypertension Father    Early death Sister        infant death  Heart disease Brother    Hydrocephalus Brother    Heart disease Brother    Diabetes Brother    Cancer Brother        prostat   Healthy Son    Healthy Son     Medications:   Current Outpatient Medications on File Prior to Visit  Medication Sig Dispense Refill   acetaminophen (TYLENOL) 325 MG tablet Take 2 tablets (650 mg total) by mouth every 6 (six) hours as needed for mild pain (or Fever >/= 101).     apixaban (ELIQUIS) 5 MG TABS tablet Take 1 tablet (5 mg total) by mouth 2 (two) times daily. 200 tablet 3   atorvastatin (LIPITOR) 40 MG tablet Take 1 tablet (40 mg total) by mouth daily. 100 tablet 3   atropine 1 % ophthalmic solution Place 1 drop into the left eye 2 (two) times daily. 3 mL 11   Blood Glucose Monitoring Suppl (ONETOUCH VERIO) w/Device KIT Use 1-4 times daily as needed/directed DX E11.9 1 kit 0   cetirizine (ZYRTEC) 10 MG tablet Take 1 tablet by mouth as needed for allergies.     cholecalciferol (VITAMIN D3) 25 MCG (1000 UNIT) tablet Take 1,000 Units by mouth daily.     cyanocobalamin 1000 MCG tablet Take 1,000 mcg by mouth daily.     Dulaglutide (TRULICITY) 1.5 MG/0.5ML SOAJ Inject 1.5 mg into the skin once a week. Inject the contents of one pen once per week 6 mL 5   hydrochlorothiazide (HYDRODIURIL) 25 MG tablet Take 1 tablet (25 mg total) by mouth daily. 100 tablet 3   insulin aspart (NOVOLOG) 100 UNIT/ML injection Pt takes 9 units before breakfast, and 8 units before supper. 10 mL 3   insulin degludec (TRESIBA FLEXTOUCH) 200 UNIT/ML FlexTouch Pen Take 44 units qam 9 mL 3   Insulin Pen Needle 32G X 4 MM MISC  Use 4x a day 300 each 3   latanoprost (XALATAN) 0.005 % ophthalmic solution Place 1 drop into the left eye at bedtime.      lisinopril (ZESTRIL) 20 MG tablet Take 1 tablet (20 mg total) by mouth daily. 100 tablet 3   memantine (NAMENDA) 10 MG tablet Take 1 tablet (10 mg total) by mouth 2 (two) times daily. 180 tablet 3   metoprolol succinate (TOPROL-XL) 100 MG 24 hr tablet TAKE 1 TABLET BY MOUTH  DAILY WITH OR IMMEDIATELY  FOLLOWING A MEAL 100 tablet 3   ONETOUCH VERIO test strip Use 1-4 times daily as directed/needed   DX E11.9 200 each 12   UNABLE TO FIND Diet: NAS and consistent carbohydrate     Current Facility-Administered Medications on File Prior to Visit  Medication Dose Route Frequency Provider Last Rate Last Admin   Insulin Pen Needle (NOVOFINE) 10 each  1 packet Subcutaneous PRN Th[place], [place], MD        Allergies:   Allergies  Allergen Reactions   Baclofen Other (See Comments)    Stroke like symptoms   Morphine And Codeine Other (See Comments)    Delirium       OBJECTIVE:  Physical Exam  There were no vitals filed for this visit.  There is no height or weight on file to calculate BMI. No results found.  General: well developed, well nourished, pleasant elderly Caucasian male, seated, in no evident distress Head: head normocephalic and atraumatic.   Neck: supple with no carotid or supraclavicular bruits Cardiovascular: regular rate and rhythm, no murmurs Musculoskeletal: no deformity Skin:  no rash/petichiae  Vascular:  Normal pulses all extremities   Neurologic Exam Mental Status: Awake and fully alert. Oriented to place and time. Recent and remote memory impaired. Attention span, concentration and fund of knowledge impaired. Mood and affect appropriate.  Cranial Nerves: Pupils equal, briskly reactive to light. Extraocular movements full without nystagmus. Visual fields full to confrontation. Hearing intact. Facial sensation intact. Face, tongue, palate moves  normally and symmetrically.  Motor: Normal strength on right side, increased tone LUE, mild weakness and increased tone LLE  Sensory.: intact to touch , pinprick , position and vibratory sensation.  Coordination: Rapid alternating movements normal in all extremities on right side. Finger-to-nose and heel-to-shin performed accurately on right side. Gait and Station: Arises from chair with mild difficulty. Stance is normal. Gait demonstrates hemiplegic gait with use of cane. Tandem walk and heel toe not attempted  Reflexes: 1+ and symmetric. Toes downgoing.      03/18/2023   11:22 AM 09/08/2022   11:23 AM 03/04/2022   11:57 AM 04/14/2021    5:00 PM  Montreal Cognitive Assessment   Visuospatial/ Executive (0/5) 3 3 3 1   Naming (0/3) 2 2 3 3   Attention: Read list of digits (0/2) 2 2 2 2   Attention: Read list of letters (0/1) 0 0 1 1  Attention: Serial 7 subtraction starting at 100 (0/3) 1 3 0 0  Language: Repeat phrase (0/2) 1 0 1 1  Language : Fluency (0/1) 0 0 1 0  Abstraction (0/2) 1 0 0 0  Delayed Recall (0/5) 1 0 0 0  Orientation (0/6) 5 6 6 5   Total 16 16 17 13          ASSESSMENT/PLAN: Evan Stanley is a 83 y.o. year old male   Dementia, likely mixed Alzheimer's and vascular with behavior disturbance  -behaviors improved since starting Namenda  -continue Namenda 10mg  twice daily - refill provided  -MoCA 16/30 (prior 16/30)  -Discussed routine physical activity/exercise, routine memory exercises, ensuring healthy diet and good sleep          Stroke Gait abnormality  Presented with Worsening gait abnormality Since COVID infection in August 2022             Continued use of cane at all times for fall prevention Multiple strokes in the past and multiple vascular risk factors including hypertension, hyperlipidemia, diabetes, atrial fibrillation, history of stroke, history of right internal carotid artery endarterectomy, chronic left ICA and VA occlusion, peripheral vascular  disease, aortic valve replacement, previous smoker, aging,             MRI of the brain 04/2021 showed acute to subacute stroke involving right frontal subcortical region, worsening moderate generalized atrophy, moderate supratentorium small vessel disease, multiple lacunar infarction, evidence of occlusion of left internal carotid artery and right vertebral artery that was present at previous MRI            Continue Eliquis 5 mg twice a day and atorvastatin 40 mg daily -manage/prescribed by PCP/cardiology             Emphasized importance of increasing daily activity/exercise, compliant with his medications, increase water intake and routine follow-up with PCP/other specialties for stroke risk factor management      Follow up in 6 months or call earlier if needed   CC:  PCP: Raliegh Ip, DO    I spent 30 minutes of face-to-face and non-face-to-face time with patient and wife.  This included previsit chart review, lab review, study review,  order entry, electronic health record documentation, patient and wife education and discussion regarding above diagnoses and treatment plan and answered all other questions to patient and wife satisfaction   Ihor Austin, [facility]  [facility] [address] [place], [place] [address]  Phone [phone] Fax [phone] Note: This document was prepared with digital dictation and possible smart phrase technology. Any transcriptional errors that result from this process are unintentional.

## 2023-10-13 ENCOUNTER — Encounter: Payer: Self-pay | Admitting: Adult Health

## 2023-10-13 ENCOUNTER — Ambulatory Visit: Payer: Medicare Other | Admitting: Adult Health

## 2023-10-13 VITALS — BP 138/72 | HR 89 | Ht 65.0 in | Wt 177.0 lb

## 2023-10-13 DIAGNOSIS — R269 Unspecified abnormalities of gait and mobility: Secondary | ICD-10-CM

## 2023-10-13 DIAGNOSIS — F01518 Vascular dementia, unspecified severity, with other behavioral disturbance: Secondary | ICD-10-CM | POA: Diagnosis not present

## 2023-10-13 DIAGNOSIS — F02818 Dementia in other diseases classified elsewhere, unspecified severity, with other behavioral disturbance: Secondary | ICD-10-CM

## 2023-10-13 DIAGNOSIS — Z8673 Personal history of transient ischemic attack (TIA), and cerebral infarction without residual deficits: Secondary | ICD-10-CM | POA: Diagnosis not present

## 2023-10-13 DIAGNOSIS — G309 Alzheimer's disease, unspecified: Secondary | ICD-10-CM | POA: Diagnosis not present

## 2023-10-13 MED ORDER — MEMANTINE HCL 10 MG PO TABS: 10.0000 mg | ORAL_TABLET | Freq: Two times a day (BID) | ORAL | 3 refills | Status: AC

## 2023-10-13 NOTE — Patient Instructions (Addendum)
 Your Plan:  Continue Namenda 10 mg twice daily  Ensure routine physical and cognitive exercises as well as ensuring good sleep and healthy diet  Continue to follow with PCP and endocrinology for stroke risk factor management     Follow up in 1 year or call earlier if needed      Thank you for coming to see [place] at [facility]. I hope we have been able to provide you high quality care today.  You may receive a patient satisfaction survey over the next few weeks. We would appreciate your feedback and comments so that we may continue to improve ourselves and the health of our patients.

## 2023-10-14 DIAGNOSIS — M79675 Pain in left toe(s): Secondary | ICD-10-CM | POA: Diagnosis not present

## 2023-10-14 DIAGNOSIS — M79674 Pain in right toe(s): Secondary | ICD-10-CM | POA: Diagnosis not present

## 2023-10-14 DIAGNOSIS — E1142 Type 2 diabetes mellitus with diabetic polyneuropathy: Secondary | ICD-10-CM | POA: Diagnosis not present

## 2023-10-14 DIAGNOSIS — L84 Corns and callosities: Secondary | ICD-10-CM | POA: Diagnosis not present

## 2023-10-14 DIAGNOSIS — B351 Tinea unguium: Secondary | ICD-10-CM | POA: Diagnosis not present

## 2023-10-27 ENCOUNTER — Other Ambulatory Visit

## 2023-10-27 DIAGNOSIS — D631 Anemia in chronic kidney disease: Secondary | ICD-10-CM | POA: Diagnosis not present

## 2023-10-27 DIAGNOSIS — N189 Chronic kidney disease, unspecified: Secondary | ICD-10-CM | POA: Diagnosis not present

## 2023-10-27 DIAGNOSIS — R809 Proteinuria, unspecified: Secondary | ICD-10-CM | POA: Diagnosis not present

## 2023-11-02 ENCOUNTER — Telehealth: Payer: Self-pay | Admitting: Pharmacy Technician

## 2023-11-02 ENCOUNTER — Other Ambulatory Visit (HOSPITAL_COMMUNITY): Payer: Self-pay

## 2023-11-02 NOTE — Telephone Encounter (Signed)
 Pharmacy Patient Advocate Encounter   Received notification from CoverMyMeds that prior authorization for Trulicity 1.5MG /0.5ML auto-injectors is required/requested.   Insurance verification completed.   The patient is insured through [facility] ADVANTAGE/RX ADVANCE .   Per test claim: PA required; PA submitted to above mentioned insurance via CoverMyMeds Key/confirmation #/EOC [facility] Status is pending

## 2023-11-02 NOTE — Telephone Encounter (Signed)
 Pharmacy Patient Advocate Encounter  Received notification from [facility] ADVANTAGE/RX ADVANCE that Prior Authorization for Trulicity 1.5MG /0.5ML auto-injectors has been APPROVED from 11/02/2023 to 11/01/2024. Ran test claim, Copay is $0.00. This test claim was processed through [facility]- copay amounts may vary at other pharmacies due to pharmacy/plan contracts, or as the patient moves through the different stages of their insurance plan.   PA #/Case ID/Reference #: [id number]

## 2023-11-03 DIAGNOSIS — E1129 Type 2 diabetes mellitus with other diabetic kidney complication: Secondary | ICD-10-CM | POA: Diagnosis not present

## 2023-11-03 DIAGNOSIS — N1832 Chronic kidney disease, stage 3b: Secondary | ICD-10-CM | POA: Diagnosis not present

## 2023-11-03 DIAGNOSIS — I5032 Chronic diastolic (congestive) heart failure: Secondary | ICD-10-CM | POA: Diagnosis not present

## 2023-11-03 DIAGNOSIS — R809 Proteinuria, unspecified: Secondary | ICD-10-CM | POA: Diagnosis not present

## 2023-11-17 ENCOUNTER — Ambulatory Visit: Payer: HMO | Admitting: Endocrinology

## 2023-11-17 ENCOUNTER — Telehealth: Payer: Self-pay | Admitting: Adult Health

## 2023-11-17 NOTE — Telephone Encounter (Signed)
 Question if he is having partial seizures, needs to be evaluated by Dr. Gracie Lav.

## 2023-11-17 NOTE — Telephone Encounter (Signed)
 Spouse has called, pt does not want to come to the appointment, she asked it be cx and she will call back to r/s in a few days.

## 2023-11-17 NOTE — Telephone Encounter (Signed)
 Spoke to wife and she was agreeable to scheduling tomorrow at [facility]

## 2023-11-17 NOTE — Telephone Encounter (Signed)
 Spoke w/Pt wife regarding episodes of Pt having involuntary movement of Right hand & arm. Wife states first episode was Sat and Pt has had at least one episode each day since. Wife states Pt arm will go up to his shoulder and his hand will turn back toward him. Pt is still able to communicate when this occurs but is unable to hold anything in his left hand at the time. Wife stated when this occurred one day, son was concerned and called 911. First responders arrived and checked Pt - VS normal - no findings and did not transport to hospital. Pt does not have pain when this occurs but is frustrated and doesn't want to go out in public because he gets embarrassed. Wife states will not allow Pt to drive because she is afraid he will lose control of car if this occurs so Pt is even more frustrated. Informed wife will send a note to NP and we will reach out to her if any recommendations are made. Wife stated understanding and thanked for the call back.

## 2023-11-17 NOTE — Telephone Encounter (Signed)
 Pt's wife called wanting to discuss a new symptom that has started with the pt. She states that starting last Sat the pt started having involuntary movement of his R arm. She would like to discuss with RN to be advised.

## 2023-11-18 ENCOUNTER — Ambulatory Visit: Admitting: Neurology

## 2023-11-23 ENCOUNTER — Ambulatory Visit (INDEPENDENT_AMBULATORY_CARE_PROVIDER_SITE_OTHER): Admitting: Endocrinology

## 2023-11-23 ENCOUNTER — Encounter: Payer: Self-pay | Admitting: Endocrinology

## 2023-11-23 ENCOUNTER — Other Ambulatory Visit: Payer: Self-pay

## 2023-11-23 VITALS — BP 126/70 | HR 71 | Resp 20 | Ht 65.0 in | Wt 174.6 lb

## 2023-11-23 DIAGNOSIS — Z794 Long term (current) use of insulin: Secondary | ICD-10-CM | POA: Diagnosis not present

## 2023-11-23 DIAGNOSIS — N183 Chronic kidney disease, stage 3 unspecified: Secondary | ICD-10-CM

## 2023-11-23 DIAGNOSIS — I129 Hypertensive chronic kidney disease with stage 1 through stage 4 chronic kidney disease, or unspecified chronic kidney disease: Secondary | ICD-10-CM | POA: Diagnosis not present

## 2023-11-23 DIAGNOSIS — Z7985 Long-term (current) use of injectable non-insulin antidiabetic drugs: Secondary | ICD-10-CM | POA: Diagnosis not present

## 2023-11-23 DIAGNOSIS — E1122 Type 2 diabetes mellitus with diabetic chronic kidney disease: Secondary | ICD-10-CM | POA: Diagnosis not present

## 2023-11-23 LAB — POCT GLYCOSYLATED HEMOGLOBIN (HGB A1C): Hemoglobin A1C: 6.9 % — AB (ref 4.0–5.6)

## 2023-11-23 MED ORDER — NOVOFINE PEN NEEDLE 32G X 6 MM MISC: 0 refills | Status: DC

## 2023-11-23 NOTE — Progress Notes (Unsigned)
 Outpatient Endocrinology Note [place] Marlyne Totaro, MD  11/23/23  Patient's Name: Evan Stanley    DOB: [birth year]    MRN: [id number]                                                    REASON OF VISIT: Follow up of type 2 diabetes mellitus  PCP: Eliodoro Guerin, DO  HISTORY OF PRESENT ILLNESS:   Evan Stanley is a 83 y.o. old male with past medical history listed below, is here for follow up for type 2 diabetes mellitus.   Pertinent Diabetes History: Patient was diagnosed with type 2 diabetes mellitus in 2010.  Chronic Diabetes Complications : Retinopathy: ? no. Last ophthalmology exam was done on annually, following with ophthalmology regularly.  Nephropathy: CKD IIIb on ACE/ARB / lisinopril , following with nephrology. Peripheral neuropathy: no Coronary artery disease: yes s/p CABG.  Stroke: yes  Relevant comorbidities and cardiovascular risk factors: Obesity: no Body mass index is 29.05 kg/m.  Hypertension: Yes  Hyperlipidemia : Yes, on statin   Current / Home Diabetic regimen includes: Tresiba  44 units daily  in the morning. NovoLog  9 units with breakfast and dinner. Trulicity   1.5 mg weekly.  Patient gets tresiba  / Novolog  / Trulicity  from patient assistance.   Prior diabetic medications: Glimepiride   and metformin  in the past.   Glycemic data:   He has refused to use CGM in the past.  He has One Touch Verio flex glucometer.  Glucometer data from April 22 to Nov 23, 2023 average blood sugar 121, he has been checking 2-3 times a day.  Fasting blood sugar mostly acceptable in the range of 82 - 121 range.   Blood sugar in the evening after 2 hours of the supper mostly acceptable 96, 151, 170, 150, 78, 121.  No hypoglycemia.    Hypoglycemia: Patient has no hypoglycemic episodes. Patient has hypoglycemia awareness.  Factors modifying glucose control: 1.  Diabetic diet assessment: 2- 3 meals.  2.  Staying active or exercising: Occasional golf playing.  3.  Medication  compliance: compliant all of the time.  Interval history  Diabetes regimen reviewed and noted as above.  Hemoglobin A1c 7.1% today.  He usually takes 9 units of NovoLog  with breakfast and supper.  No other complaints today.  REVIEW OF SYSTEMS As per history of present illness.   PAST MEDICAL HISTORY: Past Medical History:  Diagnosis Date   AKI (acute kidney injury) (HCC)    Allergy    Anxiety    Arthritis    Right shoulder   Atrial fibrillation (HCC)    Carotid artery stenosis    Cataract    Cerebrovascular accident (CVA) (HCC) 04/14/2021   CHF (congestive heart failure) (HCC)    CVA (cerebral vascular accident) (HCC) 04/28/2016   Decreased vision    left eye   Diabetes mellitus without complication (HCC)    Takes Metformin    Family history of epilepsy and other diseases of the nervous system 05/13/2019   History of TIA (transient ischemic attack) 05/13/2019   Hyperlipidemia    Hypertension    Salmonella bacteremia 04/29/2016   Sepsis (HCC) 04/29/2016   Stroke (HCC) 11/18/2015   no deficits   TIA (transient ischemic attack)    affected left eye   Urgency of urination     PAST SURGICAL HISTORY: Past Surgical  History:  Procedure Laterality Date   AORTIC VALVE REPLACEMENT     BACK SURGERY  80s   CARDIAC VALVE REPLACEMENT  11/01/2008   21-mm Edwards Pericardial Magna - Ease valve   ENDARTERECTOMY Right 01/13/2016   Procedure: ENDARTERECTOMY CAROTID - RIGHT;  Surgeon: Arvil Lauber, MD;  Location: [facility] OR;  Service: Vascular;  Laterality: Right;   EYE SURGERY     laser surgery, left eye  Left 10-29-2015    retinal surgery/ Rochell Chroman MD    PERIPHERAL VASCULAR CATHETERIZATION Right 11/18/2015   Procedure: Carotid Angiography;  Surgeon: Arvil Lauber, MD;  Location: [facility] INVASIVE CV LAB;  Service: Cardiovascular;  Laterality: Right;   PERIPHERAL VASCULAR CATHETERIZATION N/A 11/18/2015   Procedure: Aortic Arch Angiography;  Surgeon: Arvil Lauber, MD;  Location: [facility] INVASIVE CV LAB;   Service: Cardiovascular;  Laterality: N/A;   TEE WITHOUT CARDIOVERSION N/A 05/05/2016   Procedure: TRANSESOPHAGEAL ECHOCARDIOGRAM (TEE);  Surgeon: Lenise Quince, MD;  Location: [facility] ENDOSCOPY;  Service: Cardiovascular;  Laterality: N/A;    ALLERGIES: Allergies  Allergen Reactions   Baclofen  Other (See Comments)    Stroke like symptoms   Morphine  And Codeine Other (See Comments)    Delirium     FAMILY HISTORY:  Family History  Problem Relation Age of Onset   Heart disease Mother    Cancer Mother        breast   Stroke Father 76   Hypertension Father    Early death Sister        infant death   Heart disease Brother    Hydrocephalus Brother    Heart disease Brother    Diabetes Brother    Cancer Brother        prostat   Healthy Son    Healthy Son     SOCIAL HISTORY: Social History   Socioeconomic History   Marital status: Married    Spouse name: [name]   Number of children: 2   Years of education: 11   Highest education level: 11th grade  Occupational History   Occupation: maintenance    Employer: [employer]    Comment: Retired  Tobacco Use   Smoking status: Former    Current packs/day: 0.00    Average packs/day: 2.0 packs/day for 20.0 years (40.0 ttl pk-yrs)    Types: Cigarettes    Start date: 07/20/1966    Quit date: 07/20/1986    Years since quitting: 37.3   Smokeless tobacco: Never  Vaping Use   Vaping status: Never Used  Substance and Sexual Activity   Alcohol use: No    Alcohol/week: 0.0 standard drinks of alcohol   Drug use: No   Sexual activity: Yes  Other Topics Concern   Not on file  Social History Narrative   Lives with wife in home   Right Handed   Drinks 6-7 cups caffeine daily   Social Drivers of Health   Financial Resource Strain: Low Risk  (07/15/2023)   Overall Financial Resource Strain (CARDIA)    Difficulty of Paying Living Expenses: Not hard at all  Food Insecurity: No Food Insecurity (07/15/2023)   Hunger Vital Sign    Worried About  Running Out of Food in the Last Year: Never true    Ran Out of Food in the Last Year: Never true  Transportation Needs: No Transportation Needs (07/15/2023)   PRAPARE - Administrator, Civil Service (Medical): No    Lack of Transportation (Non-Medical): No  Physical Activity: Inactive (  07/15/2023)   Exercise Vital Sign    Days of Exercise per Week: 0 days    Minutes of Exercise per Session: 0 min  Stress: No Stress Concern Present (07/15/2023)   [employer] of Occupational Health - Occupational Stress Questionnaire    Feeling of Stress : Not at all  Social Connections: Moderately Isolated (07/15/2023)   Social Connection and Isolation Panel [NHANES]    Frequency of Communication with Friends and Family: More than three times a week    Frequency of Social Gatherings with Friends and Family: Three times a week    Attends Religious Services: Never    Active Member of Clubs or Organizations: No    Attends Banker Meetings: Never    Marital Status: Married    MEDICATIONS:  Current Outpatient Medications  Medication Sig Dispense Refill   acetaminophen  (TYLENOL ) 325 MG tablet Take 2 tablets (650 mg total) by mouth every 6 (six) hours as needed for mild pain (or Fever >/= 101).     apixaban  (ELIQUIS ) 5 MG TABS tablet Take 1 tablet (5 mg total) by mouth 2 (two) times daily. 200 tablet 3   atorvastatin  (LIPITOR) 40 MG tablet Take 1 tablet (40 mg total) by mouth daily. 100 tablet 3   Blood Glucose Monitoring Suppl (ONETOUCH VERIO) w/Device KIT Use 1-4 times daily as needed/directed DX E11.9 1 kit 0   cetirizine (ZYRTEC) 10 MG tablet Take 1 tablet by mouth as needed for allergies.     cholecalciferol (VITAMIN D3) 25 MCG (1000 UNIT) tablet Take 1,000 Units by mouth daily.     cyanocobalamin  1000 MCG tablet Take 1,000 mcg by mouth daily.     Dulaglutide  (TRULICITY ) 1.5 MG/0.5ML SOAJ Inject 1.5 mg into the skin once a week. Inject the contents of one pen once per week  6 mL 5   hydrochlorothiazide  (HYDRODIURIL ) 25 MG tablet Take 1 tablet (25 mg total) by mouth daily. 100 tablet 3   insulin  aspart (NOVOLOG  FLEXPEN) 100 UNIT/ML FlexPen take 9 units before breakfast, and 8 units before supper. 15 mL 11   insulin  degludec (TRESIBA  FLEXTOUCH) 200 UNIT/ML FlexTouch Pen Take 44 units qam 9 mL 3   Insulin  Pen Needle (NOVOFINE PEN NEEDLE) 32G X 6 MM MISC Use with flex pen to administer insulin  300 each 0   Insulin  Pen Needle 32G X 4 MM MISC Use 4x a day 300 each 3   latanoprost  (XALATAN ) 0.005 % ophthalmic solution Place 1 drop into the left eye at bedtime.      lisinopril  (ZESTRIL ) 20 MG tablet Take 1 tablet (20 mg total) by mouth daily. 100 tablet 3   memantine  (NAMENDA ) 10 MG tablet Take 1 tablet (10 mg total) by mouth 2 (two) times daily. 180 tablet 3   metoprolol  succinate (TOPROL -XL) 100 MG 24 hr tablet TAKE 1 TABLET BY MOUTH  DAILY WITH OR IMMEDIATELY  FOLLOWING A MEAL 100 tablet 3   ONETOUCH VERIO test strip Use 1-4 times daily as directed/needed   DX E11.9 200 each 12   UNABLE TO FIND Diet: NAS and consistent carbohydrate     atropine  1 % ophthalmic solution Place 1 drop into the left eye 2 (two) times daily. 3 mL 11   Current Facility-Administered Medications  Medication Dose Route Frequency Provider Last Rate Last Admin   Insulin  Pen Needle (NOVOFINE) 10 each  1 packet Subcutaneous PRN Evan Stanley, [place], MD        PHYSICAL EXAM: Vitals:   11/23/23 1506  BP: 126/70  Pulse: 71  Resp: 20  SpO2: 96%  Weight: 174 lb 9.6 oz (79.2 kg)  Height: 5\' 5"  (1.651 m)    Body mass index is 29.05 kg/m.  Wt Readings from Last 3 Encounters:  11/23/23 174 lb 9.6 oz (79.2 kg)  10/13/23 177 lb (80.3 kg)  08/18/23 177 lb (80.3 kg)    General: Well developed, well nourished male in no apparent distress.  HEENT: AT/[place], no external lesions.  Eyes: Conjunctiva clear and no icterus. Neck: Neck supple  Lungs: Respirations not labored Neurologic: Alert, oriented, normal  speech Extremities / Skin: Dry.  Psychiatric: Does not appear depressed or anxious  Diabetic Foot Exam - Simple   Simple Foot Form Diabetic Foot exam was performed with the following findings: Yes 11/23/2023  3:32 PM  Visual Inspection See comments: Yes Sensation Testing Intact to touch and monofilament testing bilaterally: Yes Pulse Check Posterior Tibialis and Dorsalis pulse intact bilaterally: Yes Comments Dystrophic nails+, left foot callus +    LABS Reviewed Lab Results  Component Value Date   HGBA1C 6.9 (A) 11/23/2023   HGBA1C 7.1 (A) 08/18/2023   HGBA1C 7.4 (A) 05/20/2023   Lab Results  Component Value Date   FRUCTOSAMINE 274 08/12/2020   FRUCTOSAMINE 323 (H) 04/22/2018   Lab Results  Component Value Date   CHOL 114 07/08/2022   HDL 40 07/08/2022   LDLCALC 56 07/08/2022   LDLDIRECT 62 02/16/2017   TRIG 97 07/08/2022   CHOLHDL 2.9 07/08/2022   Lab Results  Component Value Date   MICRALBCREAT 32.0 (H) 05/20/2023   MICRALBCREAT 13.5 08/12/2020   Lab Results  Component Value Date   CREATININE 2.01 (H) 05/20/2023   Lab Results  Component Value Date   GFR 30.80 (L) 05/20/2023    ASSESSMENT / PLAN  1. Type 2 DM with CKD stage 3 and hypertension (HCC)      Diabetes Mellitus type 2, complicated by diabetic nephropathy/CAD. - Diabetic status / severity: Fair control.  Lab Results  Component Value Date   HGBA1C 6.9 (A) 11/23/2023    - Hemoglobin A1c goal : <7.5%  - Medications: See below.  I) continue Tresiba  44 units daily. II) adjust NovoLog  6 to 10 units with meals with breakfast and dinner based on meal size. III) continue Trulicity  1.5 mg weekly.  Patient assistance for Tresiba  and NovoLog  for Novo Nordisk form completed last visit.  Patient reports he may get from the medical insurance going forward.  - Home glucose testing: Before meals and bedtime. - Discussed/ Gave Hypoglycemia treatment plan.  # Consult : not required at this time.    # Annual urine for microalbuminuria/ creatinine ratio, no microalbuminuria currently, continue ACE/ARB /lisinopril , will check today.  Patient has been following with nephrology for CKD 3B. Last  Lab Results  Component Value Date   MICRALBCREAT 32.0 (H) 05/20/2023    # Foot check nightly.  # Annual dilated diabetic eye exams.   - Diet: Make healthy diabetic food choices - Life style / activity / exercise: Discussed.  2. Blood pressure  -  BP Readings from Last 1 Encounters:  11/23/23 126/70    - Control is in target.  - No change in current plans.   3. Lipid status / Hyperlipidemia - Last  Lab Results  Component Value Date   LDLCALC 56 07/08/2022   - Continue atorvastatin  40 mg daily.  Managed by PCP.    Diagnoses and all orders for this visit:  Type 2  DM with CKD stage 3 and hypertension (HCC) -     POCT glycosylated hemoglobin (Hb A1C)     DISPOSITION Follow up in clinic in 3 months suggested.   All questions answered and patient verbalized understanding of the plan.  [place] Timothey Dahlstrom, MD [facility] Endocrinology [facility] [address] [place], [address] [place], [place] [address] Phone # [phone]  At least part of this note was generated using voice recognition software. Inadvertent word errors may have occurred, which were not recognized during the proofreading process.

## 2023-11-23 NOTE — Patient Instructions (Signed)
 Tresiba  44 units daily  in the morning. NovoLog  9 units with breakfast and dinner. Trulicity   1.5 mg weekly.

## 2023-12-01 DIAGNOSIS — H44512 Absolute glaucoma, left eye: Secondary | ICD-10-CM | POA: Diagnosis not present

## 2023-12-01 DIAGNOSIS — H35031 Hypertensive retinopathy, right eye: Secondary | ICD-10-CM | POA: Diagnosis not present

## 2023-12-01 DIAGNOSIS — H3412 Central retinal artery occlusion, left eye: Secondary | ICD-10-CM | POA: Diagnosis not present

## 2023-12-01 DIAGNOSIS — H18232 Secondary corneal edema, left eye: Secondary | ICD-10-CM | POA: Diagnosis not present

## 2023-12-01 DIAGNOSIS — H43811 Vitreous degeneration, right eye: Secondary | ICD-10-CM | POA: Diagnosis not present

## 2023-12-07 NOTE — Progress Notes (Signed)
 Cardiology Office Note:  .   Date:  12/20/2023  ID:  LALO TROMP, DOB April 05, 1941, MRN [id number] PCP: Eliodoro Guerin, DO  [facility] Providers Cardiologist:  Ola Berger, MD    History of Present Illness: .   Evan Stanley is a 83 y.o. male  with a hx of bioprosthetic aortic valve 2010, hyperlipidemia, coronary artery disease with prior bypass grafting 2010, left ventricular systolic dysfunction, right cerebral CVA (2017), total occlusion left ICA, RCEA, and  DM type II, recent dementia diagnosis followed by neuro .  Patient comes in with his wife. Walks with a cane.Has balance issues and can't walk much due to knee problems. Denies chest pain, dyspnea, edema, palpitations. Overall thinks his heart is doing well.     ROS:    Studies Reviewed: Aaron Aas    EKG Interpretation Date/Time:  Monday December 20 2023 11:40:49 EDT Ventricular Rate:  58 PR Interval:  192 QRS Duration:  74 QT Interval:  394 QTC Calculation: 386 R Axis:   59  Text Interpretation: Sinus bradycardia with Premature supraventricular complexes Nonspecific ST and T wave abnormality Confirmed by Theotis Flake [phone]) on 12/20/2023 11:43:16 AM    Prior CV Studies:    Echo May 2024    1. Left ventricular ejection fraction, by estimation, is 60 to 65%. The  left ventricle has normal function. The left ventricle has no regional  wall motion abnormalities. There is mild left ventricular hypertrophy.  Left ventricular diastolic parameters  are consistent with Grade II diastolic dysfunction (pseudonormalization).  Elevated left atrial pressure.   2. Right ventricular systolic function is normal. The right ventricular  size is normal. Tricuspid regurgitation signal is inadequate for assessing  PA pressure.   3. The mitral valve is degenerative. Trivial mitral valve regurgitation.  No evidence of mitral stenosis. Moderate mitral annular calcification.   4. The aortic valve has been repaired/replaced. Aortic valve   regurgitation is not visualized. There is a 23 mm bioprosthetic valve  present in the aortic position. Echo findings are consistent with normal  structure and function of the aortic valve  prosthesis. Aortic valve mean gradient measures 13.0 mmHg.   5. The inferior vena cava is normal in size with greater than 50%  respiratory variability, suggesting right atrial pressure of 3 mmHg.    2D Doppler echocardiogram 2021: IMPRESSIONS   1. Left ventricular ejection fraction, by estimation, is 65 to 70%. The  left ventricle has normal function. The left ventricle has no regional  wall motion abnormalities. There is moderate left ventricular hypertrophy.  Left ventricular diastolic  parameters are consistent with Grade I diastolic dysfunction (impaired  relaxation). Elevated left ventricular end-diastolic pressure.   2. Right ventricular systolic function is low normal. The right  ventricular size is normal.   3. The mitral valve is abnormal. Trivial mitral valve regurgitation.   4. The aortic valve has been repaired/replaced with a bioprosthetic  valve. Aortic valve regurgitation is not visualized. Aortic valve area, by  VTI measures 2.69 cm. Aortic valve mean gradient measures 8.0 mmHg. Peak  gradient is 13.5 mmHg. Aortic valve  Vmax measures 1.84 m/s.   Comparison(s): 05/15/2019: LVEF 60-65%, aortic valve mean gradient 13  mmHg, peak gradient 24 mmHg.     Risk Assessment/Calculations:    CHA2DS2-VASc Score = 8   This indicates a 10.8% annual risk of stroke. The patient's score is based upon: CHF History: 1 HTN History: 1 Diabetes History: 1 Stroke History: 2 Vascular Disease History:  1 Age Score: 2 Gender Score: 0            Physical Exam:   VS:  BP (!) 122/56 (BP Location: Right Arm, Patient Position: Sitting, Cuff Size: Normal)   Pulse (!) 58   Ht 5\' 5"  (1.651 m)   Wt 174 lb 12.8 oz (79.3 kg)   SpO2 98%   BMI 29.09 kg/m    Wt Readings from Last 3 Encounters:   12/20/23 174 lb 12.8 oz (79.3 kg)  11/23/23 174 lb 9.6 oz (79.2 kg)  10/13/23 177 lb (80.3 kg)    GEN: Well nourished, well developed in no acute distress NECK: No JVD; No carotid bruits CARDIAC:  RRR, crisp valves RESPIRATORY:  Clear to auscultation without rales, wheezing or rhonchi  ABDOMEN: Soft, non-tender, non-distended EXTREMITIES:  Brawny changes, No edema; No deformity   ASSESSMENT AND PLAN: .    CAD   s/p CABG and AVR in 2010    Myoview  in 2017   Prior MI  periinfarct ischemia   LVEF 63%      S/p AVR 2010   Last echo 11/2022 prosthesis functioning well   Hx of HFrEF echo 11/2022 LVEF and RVEF normal     Atrial fibrillation  on Eliquis  5 mg bid, labs reviewed in [facility] and   Crt. 1.95 10/2023. Given age 31 yrs old and Crt >1.5 he should be on lower dose eliquis  2.5 mg bid   Hx CVA in 2017 100% LICA,  s/p R CEA    Hyperlipidemia   LDL in Dec 2023 was 56  HDL 40  Trig 97      CKD  Follows in renal clinic with Dr Carrolyn Clan  Cr 1.95 has repeat in July   DM  Follows with Dr Hubert Madden   last A1C was 6.9             Dispo: f/u Dr. Avanell Bob 1 yr.  Signed, Theotis Flake, PA-C

## 2023-12-20 ENCOUNTER — Ambulatory Visit: Attending: Physician Assistant | Admitting: Physician Assistant

## 2023-12-20 ENCOUNTER — Encounter: Payer: Self-pay | Admitting: Physician Assistant

## 2023-12-20 VITALS — BP 122/56 | HR 58 | Ht 65.0 in | Wt 174.8 lb

## 2023-12-20 DIAGNOSIS — Z8673 Personal history of transient ischemic attack (TIA), and cerebral infarction without residual deficits: Secondary | ICD-10-CM

## 2023-12-20 DIAGNOSIS — E785 Hyperlipidemia, unspecified: Secondary | ICD-10-CM | POA: Diagnosis not present

## 2023-12-20 DIAGNOSIS — I5020 Unspecified systolic (congestive) heart failure: Secondary | ICD-10-CM

## 2023-12-20 DIAGNOSIS — E1159 Type 2 diabetes mellitus with other circulatory complications: Secondary | ICD-10-CM | POA: Diagnosis not present

## 2023-12-20 DIAGNOSIS — I480 Paroxysmal atrial fibrillation: Secondary | ICD-10-CM

## 2023-12-20 DIAGNOSIS — I152 Hypertension secondary to endocrine disorders: Secondary | ICD-10-CM

## 2023-12-20 DIAGNOSIS — E1169 Type 2 diabetes mellitus with other specified complication: Secondary | ICD-10-CM | POA: Diagnosis not present

## 2023-12-20 DIAGNOSIS — Z952 Presence of prosthetic heart valve: Secondary | ICD-10-CM

## 2023-12-20 DIAGNOSIS — I2510 Atherosclerotic heart disease of native coronary artery without angina pectoris: Secondary | ICD-10-CM | POA: Diagnosis not present

## 2023-12-20 MED ORDER — APIXABAN 2.5 MG PO TABS: 2.5000 mg | ORAL_TABLET | Freq: Two times a day (BID) | ORAL | 11 refills | Status: AC

## 2023-12-20 NOTE — Patient Instructions (Addendum)
 Medication Instructions:  Your physician has recommended you make the following change in your medication:  DECREASE ELIQUIS  TO 2.5 MG TWICE DAILY.   *If you need a refill on your cardiac medications before your next appointment, please call your pharmacy*  Lab Work: NONE If you have labs (blood work) drawn today and your tests are completely normal, you will receive your results only by: MyChart Message (if you have MyChart) OR A paper copy in the mail If you have any lab test that is abnormal or we need to change your treatment, we will call you to review the results.  Testing/Procedures: NONE  Follow-Up: At [facility], you and your health needs are our priority.  As part of our continuing mission to provide you with exceptional heart care, our providers are all part of one team.  This team includes your primary Cardiologist (physician) and Advanced Practice Providers or APPs (Physician Assistants and Nurse Practitioners) who all work together to provide you with the care you need, when you need it.  Your next appointment:   1 year(s)  Provider:   Ola Berger, MD   We recommend signing up for the patient portal called "MyChart".  Sign up information is provided on this After Visit Summary.  MyChart is used to connect with patients for Virtual Visits (Telemedicine).  Patients are able to view lab/test results, encounter notes, upcoming appointments, etc.  Non-urgent messages can be sent to your provider as well.   To learn more about what you can do with MyChart, go to [email or web address].

## 2024-01-04 DIAGNOSIS — B351 Tinea unguium: Secondary | ICD-10-CM | POA: Diagnosis not present

## 2024-01-04 DIAGNOSIS — E1142 Type 2 diabetes mellitus with diabetic polyneuropathy: Secondary | ICD-10-CM | POA: Diagnosis not present

## 2024-01-04 DIAGNOSIS — M79674 Pain in right toe(s): Secondary | ICD-10-CM | POA: Diagnosis not present

## 2024-01-04 DIAGNOSIS — M79675 Pain in left toe(s): Secondary | ICD-10-CM | POA: Diagnosis not present

## 2024-01-04 DIAGNOSIS — L84 Corns and callosities: Secondary | ICD-10-CM | POA: Diagnosis not present

## 2024-01-13 ENCOUNTER — Telehealth: Payer: Self-pay | Admitting: Pharmacist

## 2024-01-13 DIAGNOSIS — E1122 Type 2 diabetes mellitus with diabetic chronic kidney disease: Secondary | ICD-10-CM

## 2024-01-13 NOTE — Telephone Encounter (Signed)
 Ensure pharmd f/u [id number] placed

## 2024-01-17 ENCOUNTER — Encounter: Payer: Medicare Other | Admitting: Family Medicine

## 2024-01-24 ENCOUNTER — Ambulatory Visit: Payer: Medicare Other | Admitting: Family Medicine

## 2024-01-24 ENCOUNTER — Encounter: Payer: Self-pay | Admitting: Family Medicine

## 2024-01-24 VITALS — BP 104/55 | HR 59 | Temp 98.0°F | Ht 65.0 in | Wt 173.0 lb

## 2024-01-24 DIAGNOSIS — E785 Hyperlipidemia, unspecified: Secondary | ICD-10-CM | POA: Diagnosis not present

## 2024-01-24 DIAGNOSIS — E1142 Type 2 diabetes mellitus with diabetic polyneuropathy: Secondary | ICD-10-CM

## 2024-01-24 DIAGNOSIS — N1832 Chronic kidney disease, stage 3b: Secondary | ICD-10-CM

## 2024-01-24 DIAGNOSIS — Z794 Long term (current) use of insulin: Secondary | ICD-10-CM

## 2024-01-24 DIAGNOSIS — I152 Hypertension secondary to endocrine disorders: Secondary | ICD-10-CM | POA: Diagnosis not present

## 2024-01-24 DIAGNOSIS — L570 Actinic keratosis: Secondary | ICD-10-CM

## 2024-01-24 DIAGNOSIS — Z0001 Encounter for general adult medical examination with abnormal findings: Secondary | ICD-10-CM

## 2024-01-24 DIAGNOSIS — E1122 Type 2 diabetes mellitus with diabetic chronic kidney disease: Secondary | ICD-10-CM | POA: Diagnosis not present

## 2024-01-24 DIAGNOSIS — I480 Paroxysmal atrial fibrillation: Secondary | ICD-10-CM

## 2024-01-24 DIAGNOSIS — I2510 Atherosclerotic heart disease of native coronary artery without angina pectoris: Secondary | ICD-10-CM

## 2024-01-24 DIAGNOSIS — E1169 Type 2 diabetes mellitus with other specified complication: Secondary | ICD-10-CM

## 2024-01-24 DIAGNOSIS — F01B11 Vascular dementia, moderate, with agitation: Secondary | ICD-10-CM | POA: Diagnosis not present

## 2024-01-24 DIAGNOSIS — Z0000 Encounter for general adult medical examination without abnormal findings: Secondary | ICD-10-CM

## 2024-01-24 DIAGNOSIS — E1159 Type 2 diabetes mellitus with other circulatory complications: Secondary | ICD-10-CM | POA: Diagnosis not present

## 2024-01-24 LAB — LIPID PANEL

## 2024-01-24 MED ORDER — LISINOPRIL 20 MG PO TABS: 20.0000 mg | ORAL_TABLET | Freq: Every day | ORAL | 3 refills | Status: AC

## 2024-01-24 MED ORDER — HYDROCHLOROTHIAZIDE 25 MG PO TABS: 25.0000 mg | ORAL_TABLET | Freq: Every day | ORAL | 3 refills | Status: AC

## 2024-01-24 MED ORDER — ATORVASTATIN CALCIUM 40 MG PO TABS: 40.0000 mg | ORAL_TABLET | Freq: Every day | ORAL | 3 refills | Status: AC

## 2024-01-24 MED ORDER — METOPROLOL SUCCINATE ER 100 MG PO TB24: ORAL_TABLET | ORAL | 3 refills | Status: AC

## 2024-01-24 NOTE — Patient Instructions (Signed)
Cryoablation, Care After The following information offers guidance on how to care for yourself after your procedure. Your health care provider may also give you more specific instructions. If you have problems or questions, contact your health care provider. What can I expect after the procedure? After the procedure, it is common to have: Redness or blisters near the area treated. Mild pain and swelling. Follow these instructions at home: Treatment area care  If you have an incision, follow instructions from your health care provider about how to take care of it. Make sure you: Wash your hands with soap and water for at least 20 seconds before and after you change your bandage (dressing). If soap and water are not available, use hand sanitizer. Change your dressing as told by your health care provider. Leave stitches (sutures), skin glue, or adhesive strips in place. These skin closures may need to stay in place for 2 weeks or longer. If adhesive strip edges start to loosen and curl up, you may trim the loose edges. Do not remove adhesive strips completely unless your health care provider tells you to do that. Check your treatment area every day for signs of infection. Check for: More redness, swelling, or pain. Fluid or blood. Warmth. Pus or a bad smell. Keep the treated area clean and dry. Keep it covered with a dressing until it has healed. Clean the area with soap and water as told by your health care provider. If your dressing gets wet, change it right away. Activity  Follow instructions from your health care provider about what activities are safe for you. You may have to avoid lifting. Ask your health care provider how much you can safely lift. If you were given a sedative during the procedure, it can affect you for several hours. Do not drive or operate machinery until your health care provider says that it is safe. General instructions Take over-the-counter and prescription  medicines only as told by your health care provider. Do not use any products that contain nicotine or tobacco. These products include cigarettes, chewing tobacco, and vaping devices, such as e-cigarettes. These can delay incision healing. If you need help quitting, ask your health care provider. Do not take baths, swim, or use a hot tub until your health care provider approves. Ask your health care provider if you may take showers. You may only be allowed to take sponge baths. Keep all follow-up visits. Your health care provider may need to check that treatment worked and that there were no problems caused by the procedure. Contact a health care provider if: You have more pain. You have a fever. You have nausea or vomiting. You have any signs of infection. You do not have a bowel movement for 2 days. You cannot urinate, or you cannot control when you urinate or have a bowel movement (have incontinence). You develop impotence. Get help right away if: You have severe pain. You have trouble swallowing or breathing. You are very weak or dizzy. You have chest pain or shortness of breath. These symptoms may be an emergency. Get help right away. Call 911. Do not wait to see if the symptoms will go away. Do not drive yourself to the hospital. This information is not intended to replace advice given to you by your health care provider. Make sure you discuss any questions you have with your health care provider. Document Revised: 12/19/2021 Document Reviewed: 12/19/2021 Elsevier Patient Education  2024 [employer] Actinic Keratosis An actinic keratosis is a precancerous  growth on the skin. If there is more than one, the condition is called actinic keratoses. These growths appear most often on parts of the skin that get a lot of sun exposure, including the: Scalp. Face. Ears. Lips. Upper back. Forearms. Backs of the hands. If left untreated, these growths may develop into a skin cancer called  squamous cell carcinoma. It is important to have all these growths checked by a health care provider to determine the best treatment. What are the causes? Actinic keratoses are caused by getting too much ultraviolet (UV) radiation from the sun or other UV light sources. What increases the risk? You are more likely to develop this condition if you: Have light-colored skin or blue eyes. Have blond or red hair. Spend a lot of time in the sun. Do not protect your skin from the sun when outdoors. Are an older person. The risk of developing an actinic keratosis increases with age. What are the signs or symptoms? These growths feel like scaly, rough spots of skin. Symptoms of this condition include growths that may: Be as small as a pinhead or as big as a quarter. Itch, hurt, or feel sensitive. Be skin-colored, light tan, dark tan, pink, or a combination of these colors. In most cases, the growths become red. Have a small piece of pink or gray skin (skin tag) growing from them. It may be easier to notice the growths by feeling them rather than seeing them. Sometimes, actinic keratoses disappear but may return a few days to a few weeks later. How is this diagnosed? This condition is usually diagnosed with a physical exam. A tissue sample may be removed from the growth and examined under a microscope (biopsy). How is this treated? This condition may be treated by: Scraping off the actinic keratosis (curettage). Freezing the actinic keratosis with liquid nitrogen (cryosurgery). This causes the growth to eventually fall off. Applying medicated creams or gels to destroy the cells in the growth. Applying chemicals to the growth to make the outer layers of skin peel off (chemical peel). Using photodynamic therapy. In this procedure, medicated cream is applied to the actinic keratosis. This cream increases your skin's sensitivity to light. Then, a strong light is aimed at the actinic keratosis to destroy  cells in the growth. Follow these instructions at home: Skin care Apply cool, wet cloths (coolcompresses) to the affected areas. Do not scratch your skin. Check your skin regularly for any growths, especially ones that: Start to itch or bleed. Change in size, shape, or color. Caring for the treated area Keep the treated area clean and dry as told by your health care provider. Do not apply any medicine, cream, or lotion to the treated area unless your health care provider tells you to do that. Do not pick at blisters or try to break them open. This can cause infection and scarring. If you have red or irritated skin after treatment, follow instructions from your health care provider about how to take care of the treated area. Make sure you: Wash your hands with soap and water for at least 20 seconds before and after you change your bandage (dressing). If soap and water are not available, use hand sanitizer. Change your dressing as told by your health care provider. If you have red or irritated skin after treatment, check the treated area every day for signs of infection. Check for: Redness, swelling, or pain. Fluid or blood. Warmth. Pus or a bad smell. Lifestyle Do not use any products  that contain nicotine or tobacco. These products include cigarettes, chewing tobacco, and vaping devices, such as e-cigarettes. If you need help quitting, ask your health care provider. Take steps to protect your skin from the sun, such as: Avoiding the sun between 10:00 a.m. and 4:00 p.m. This is when the UV light is the strongest. Using a sunscreen or sunblock with SPF 30 or greater. Applying sunscreen before you are exposed to sunlight and reapplying as often as told by the instructions on the sunscreen container. Wearing protective gear, including: Sunglasses with UV protection. A hat and clothing that protect your skin from sunlight. Avoiding medicines that increase your sensitivity to sunlight when  possible. Avoidingtanning beds and other indoor tanning devices. General instructions Take or apply over-the-counter and prescription medicines only as told by your health care provider. Return to your normal activities as told by your health care provider. Ask your health care provider what activities are safe for you. Have a skin exam done every year by a health care provider who is a skin specialist (dermatologist). Keep all follow-up visits. Your health care provider will want to check that the site has healed after treatment. Contact a health care provider if: You notice any changes or new growths on your skin. You have swelling, pain, or redness around your treated area. You have fluid or blood coming from your treated area. Your treated area feels warm to the touch. You have pus or a bad smell coming from your treated area. You have a fever or chills. You have a blister that becomes large and painful. Summary An actinic keratosis is a precancerous growth on the skin.If left untreated, these growths can develop into skin cancer. Check your skin regularly for any growths, especially growths that start to itch or bleed, or change in size, shape, or color. Take steps to protect your skin from the sun. Contact a health care provider if you notice any changes or new growths on your skin. This information is not intended to replace advice given to you by your health care provider. Make sure you discuss any questions you have with your health care provider. Document Revised: 09/18/2021 Document Reviewed: 09/18/2021 Elsevier Patient Education  2024 [employer].

## 2024-01-24 NOTE — Progress Notes (Signed)
 Evan Stanley is a 83 y.o. male presents to office today for annual physical exam examination.    Concerns today include: 1.  He has no concerns but his wife has concerns about the lesions on his arms.  He refuses to go see dermatology.  He does been a lot of time in the sun and does not use sunscreen.  DM managed by Dr Mercie with endocrinology, last OV 11/2023 A1c at goal 6.9.  Sees Dr Rachele for renal, had urine micro done with him recently. Sees cardiology with last OV 12/2023, eliquis  reduced to 2.5mg  BID due to age/ renal function.  Occupation: retired, Marital status: married, Substance use: none Health Maintenance Due  Topic Date Due   Diabetic kidney evaluation - Urine ACR  11/26/2018   Refills needed today: lipitor  Immunization History  Administered Date(s) Administered   Fluad Quad(high Dose 65+) 05/23/2019, 04/25/2020, 07/08/2022   Fluad Trivalent(High Dose 65+) 07/19/2023   Influenza Split 04/27/2016   Influenza Whole 05/26/2010   Influenza, High Dose Seasonal PF 05/20/2017, 05/23/2018   Influenza,inj,Quad PF,6+ Mos 06/14/2013, 04/16/2014, 06/18/2015   Influenza-Unspecified 05/14/2016, 05/20/2017, 05/23/2018   Moderna Sars-Covid-2 Vaccination 08/16/2019, 09/13/2019, 07/04/2020   Pneumococcal Conjugate-13 06/18/2015   Pneumococcal Polysaccharide-23 12/16/2016   Td 07/08/2022   Tdap 03/05/2011   Zoster Recombinant(Shingrix ) 01/13/2023   Past Medical History:  Diagnosis Date   AKI (acute kidney injury) (HCC)    Allergy    Anxiety    Arthritis    Right shoulder   Atrial fibrillation (HCC)    Carotid artery stenosis    Cataract    Cerebrovascular accident (CVA) (HCC) 04/14/2021   CHF (congestive heart failure) (HCC)    CVA (cerebral vascular accident) (HCC) 04/28/2016   Decreased vision    left eye   Diabetes mellitus without complication (HCC)    Takes Metformin    Family history of epilepsy and other diseases of the nervous system 05/13/2019   History of  TIA (transient ischemic attack) 05/13/2019   Hyperlipidemia    Hypertension    Salmonella bacteremia 04/29/2016   Sepsis (HCC) 04/29/2016   Stroke (HCC) 11/18/2015   no deficits   TIA (transient ischemic attack)    affected left eye   Urgency of urination    Social History   Socioeconomic History   Marital status: Married    Spouse name: [name]   Number of children: 2   Years of education: 11   Highest education level: 11th grade  Occupational History   Occupation: maintenance    Employer: [employer]    Comment: Retired  Tobacco Use   Smoking status: Former    Current packs/day: 0.00    Average packs/day: 2.0 packs/day for 20.0 years (40.0 ttl pk-yrs)    Types: Cigarettes    Start date: 07/20/1966    Quit date: 07/20/1986    Years since quitting: 37.5   Smokeless tobacco: Never  Vaping Use   Vaping status: Never Used  Substance and Sexual Activity   Alcohol use: No    Alcohol/week: 0.0 standard drinks of alcohol   Drug use: No   Sexual activity: Yes  Other Topics Concern   Not on file  Social History Narrative   Lives with wife in home   Right Handed   Drinks 6-7 cups caffeine daily   Social Drivers of Health   Financial Resource Strain: Low Risk  (07/15/2023)   Overall Financial Resource Strain (CARDIA)    Difficulty of Paying Living Expenses: Not hard  at all  Food Insecurity: No Food Insecurity (07/15/2023)   Hunger Vital Sign    Worried About Running Out of Food in the Last Year: Never true    Ran Out of Food in the Last Year: Never true  Transportation Needs: No Transportation Needs (07/15/2023)   PRAPARE - Administrator, Civil Service (Medical): No    Lack of Transportation (Non-Medical): No  Physical Activity: Inactive (07/15/2023)   Exercise Vital Sign    Days of Exercise per Week: 0 days    Minutes of Exercise per Session: 0 min  Stress: No Stress Concern Present (07/15/2023)   [employer] of Occupational Health - Occupational Stress  Questionnaire    Feeling of Stress : Not at all  Social Connections: Moderately Isolated (07/15/2023)   Social Connection and Isolation Panel    Frequency of Communication with Friends and Family: More than three times a week    Frequency of Social Gatherings with Friends and Family: Three times a week    Attends Religious Services: Never    Active Member of Clubs or Organizations: No    Attends Banker Meetings: Never    Marital Status: Married  Catering manager Violence: Not At Risk (07/15/2023)   Humiliation, Afraid, Rape, and Kick questionnaire    Fear of Current or Ex-Partner: No    Emotionally Abused: No    Physically Abused: No    Sexually Abused: No   Past Surgical History:  Procedure Laterality Date   AORTIC VALVE REPLACEMENT     BACK SURGERY  80s   CARDIAC VALVE REPLACEMENT  11/01/2008   21-mm Edwards Pericardial Magna - Ease valve   ENDARTERECTOMY Right 01/13/2016   Procedure: ENDARTERECTOMY CAROTID - RIGHT;  Surgeon: Redell LITTIE Door, MD;  Location: [facility] OR;  Service: Vascular;  Laterality: Right;   EYE SURGERY     laser surgery, left eye  Left 10-29-2015    retinal surgery/ Arley Ruder MD    PERIPHERAL VASCULAR CATHETERIZATION Right 11/18/2015   Procedure: Carotid Angiography;  Surgeon: Redell LITTIE Door, MD;  Location: [facility] INVASIVE CV LAB;  Service: Cardiovascular;  Laterality: Right;   PERIPHERAL VASCULAR CATHETERIZATION N/A 11/18/2015   Procedure: Aortic Arch Angiography;  Surgeon: Redell LITTIE Door, MD;  Location: [facility] INVASIVE CV LAB;  Service: Cardiovascular;  Laterality: N/A;   TEE WITHOUT CARDIOVERSION N/A 05/05/2016   Procedure: TRANSESOPHAGEAL ECHOCARDIOGRAM (TEE);  Surgeon: Redell GORMAN Shallow, MD;  Location: [facility] ENDOSCOPY;  Service: Cardiovascular;  Laterality: N/A;   Family History  Problem Relation Age of Onset   Heart disease Mother    Cancer Mother        breast   Stroke Father 60   Hypertension Father    Early death Sister        infant death   Heart disease  Brother    Hydrocephalus Brother    Heart disease Brother    Diabetes Brother    Cancer Brother        prostat   Healthy Son    Healthy Son     Current Outpatient Medications:    acetaminophen  (TYLENOL ) 325 MG tablet, Take 2 tablets (650 mg total) by mouth every 6 (six) hours as needed for mild pain (or Fever >/= 101)., Disp: , Rfl:    apixaban  (ELIQUIS ) 2.5 MG TABS tablet, Take 1 tablet (2.5 mg total) by mouth 2 (two) times daily., Disp: 60 tablet, Rfl: 11   atorvastatin  (LIPITOR) 40 MG tablet, Take 1 tablet (40  mg total) by mouth daily., Disp: 100 tablet, Rfl: 3   atropine  1 % ophthalmic solution, Place 1 drop into the left eye 2 (two) times daily., Disp: 3 mL, Rfl: 11   Blood Glucose Monitoring Suppl (ONETOUCH VERIO) w/Device KIT, Use 1-4 times daily as needed/directed DX E11.9, Disp: 1 kit, Rfl: 0   cetirizine (ZYRTEC) 10 MG tablet, Take 1 tablet by mouth as needed for allergies., Disp: , Rfl:    cholecalciferol (VITAMIN D3) 25 MCG (1000 UNIT) tablet, Take 1,000 Units by mouth daily., Disp: , Rfl:    cyanocobalamin  1000 MCG tablet, Take 1,000 mcg by mouth daily., Disp: , Rfl:    Dulaglutide  (TRULICITY ) 1.5 MG/0.5ML SOAJ, Inject 1.5 mg into the skin once a week. Inject the contents of one pen once per week, Disp: 6 mL, Rfl: 5   hydrochlorothiazide  (HYDRODIURIL ) 25 MG tablet, Take 1 tablet (25 mg total) by mouth daily., Disp: 100 tablet, Rfl: 3   insulin  aspart (NOVOLOG  FLEXPEN) 100 UNIT/ML FlexPen, take 9 units before breakfast, and 8 units before supper., Disp: 15 mL, Rfl: 11   insulin  degludec (TRESIBA  FLEXTOUCH) 200 UNIT/ML FlexTouch Pen, Take 44 units qam, Disp: 9 mL, Rfl: 3   Insulin  Pen Needle (NOVOFINE PEN NEEDLE) 32G X 6 MM MISC, Use with flex pen to administer insulin , Disp: 300 each, Rfl: 0   Insulin  Pen Needle 32G X 4 MM MISC, Use 4x a day, Disp: 300 each, Rfl: 3   latanoprost  (XALATAN ) 0.005 % ophthalmic solution, Place 1 drop into the left eye at bedtime. , Disp: , Rfl:     lisinopril  (ZESTRIL ) 20 MG tablet, Take 1 tablet (20 mg total) by mouth daily., Disp: 100 tablet, Rfl: 3   memantine  (NAMENDA ) 10 MG tablet, Take 1 tablet (10 mg total) by mouth 2 (two) times daily., Disp: 180 tablet, Rfl: 3   metoprolol  succinate (TOPROL -XL) 100 MG 24 hr tablet, TAKE 1 TABLET BY MOUTH  DAILY WITH OR IMMEDIATELY  FOLLOWING A MEAL, Disp: 100 tablet, Rfl: 3   ONETOUCH VERIO test strip, Use 1-4 times daily as directed/needed   DX E11.9, Disp: 200 each, Rfl: 12   UNABLE TO FIND, Diet: NAS and consistent carbohydrate, Disp: , Rfl:   Current Facility-Administered Medications:    Insulin  Pen Needle (NOVOFINE) 10 each, 1 packet, Subcutaneous, PRN, [place], [place], MD  Allergies  Allergen Reactions   Baclofen  Other (See Comments)    Stroke like symptoms   Morphine  And Codeine Other (See Comments)    Delirium      ROS: Review of Systems A comprehensive review of systems was negative except for: Eyes: positive for blind in left eye Integument/breast: positive for skin lesion(s) Musculoskeletal: positive for requires cane for ambulation Neurological: positive for gait problems and memory problems    Physical exam BP (!) 104/55   Pulse (!) 59   Temp 98 F (36.7 C)   Ht 5' 5 (1.651 m)   Wt 173 lb (78.5 kg)   SpO2 97%   BMI 28.79 kg/m  General appearance: alert, cooperative, appears stated age, and no distress Head: atraumatic Eyes: Ptosis of the left upper eyelid.  He has clouding of the left cornea and is legally blind in left Ears: normal TM's and external ear canals both ears Nose: Nares normal. Septum midline. Mucosa normal. No drainage or sinus tenderness. Throat: lips, mucosa, and tongue normal; teeth and gums normal Neck: no adenopathy, no carotid bruit, supple, symmetrical, trachea midline, and thyroid  not enlarged, symmetric, no  tenderness/mass/nodules Back: symmetric, no curvature. ROM normal. No CVA tenderness. Lungs: clear to auscultation bilaterally Chest  wall: no tenderness Heart: regular rate and rhythm, S1, S2 normal, no murmur, click, rub or gallop Abdomen: Protuberant but soft, nontender.  No palpable masses or hepatosplenomegaly Extremities: venous stasis dermatitis noted Pulses: 2+ and symmetric Skin: Several actinic keratoses noted along bilateral upper extremities with 9 on the right forearm extending into the dorsal aspect of the right hand and 7 on the left forearm extending into the dorsum of the left hand Lymph nodes: Cervical, supraclavicular, and axillary nodes normal. Neurologic: Follows commands.  Impaired vision as above     01/24/2024    9:05 AM 07/15/2023   10:51 AM 01/13/2023    9:10 AM  Depression screen PHQ 2/9  Decreased Interest 0 0 0  Down, Depressed, Hopeless 0 0 0  PHQ - 2 Score 0 0 0  Altered sleeping 0  0  Tired, decreased energy 0  0  Change in appetite 0  0  Feeling bad or failure about yourself  0  0  Trouble concentrating 0  0  Moving slowly or fidgety/restless 0  0  Suicidal thoughts 0  0  PHQ-9 Score 0  0  Difficult doing work/chores Not difficult at all        01/24/2024    9:06 AM 01/13/2023    9:10 AM 07/08/2022    9:32 AM 03/02/2022    9:43 AM  GAD 7 : Generalized Anxiety Score  Nervous, Anxious, on Edge 0 0 0 0  Control/stop worrying 0 0 0 0  Worry too much - different things 0 0 0 0  Trouble relaxing 0 0 0 0  Restless 0 0 0 0  Easily annoyed or irritable 0 0 0 0  Afraid - awful might happen 0 0 0 0  Total GAD 7 Score 0 0 0 0  Anxiety Difficulty Not difficult at all  Not difficult at all Not difficult at all   Cryotherapy Procedure:  Risks and benefits of procedure were reviewed with the patient.  Written consent obtained and scanned into the chart.  Lesion of concern was identified and located on right forearm and dorsal aspect of hand x9 total.  Liquid nitrogen was applied to area of concern and extending out 1 millimeters beyond the border of the lesion.  Treated area was allowed to  come back to room temperature before treating it a second time.  Patient tolerated procedure well and there were no immediate complications.  Home care instructions were reviewed with the patient and a handout was provided.   Assessment/ Plan: Thais LITTIE Sharps here for annual physical exam.   Annual physical exam  Actinic keratoses  Moderate vascular dementia with agitation (HCC)  Type 2 diabetes mellitus with stage 3b chronic kidney disease, with long-term current use of insulin  (HCC)  Hypertension associated with diabetes (HCC) - Plan: lisinopril  (ZESTRIL ) 20 MG tablet, metoprolol  succinate (TOPROL -XL) 100 MG 24 hr tablet  Hyperlipidemia associated w/ type 2 DM goal <70 - Plan: CMP14+EGFR, Lipid Panel, atorvastatin  (LIPITOR) 40 MG tablet  Coronary arteriosclerosis in native artery - Plan: CMP14+EGFR, Lipid Panel  Diabetic peripheral neuropathy associated with type 2 diabetes mellitus (HCC)  Paroxysmal A-fib (HCC)  Actinic keratosis treated with cryoablation x 9 on the right upper extremity.  Defer left upper extremity to next visit  Dementia is chronic and stable.  Wife denies any progression  Sugar was at goal at 6.9 with recent endocrinology  visit.  Continue to follow-up with nephrology for management of CKD 3B  Blood pressure controlled.  Medications renewed  Fasting lipid panel collected.  Continue statin.  Check liver enzymes.  Will CC to cardiology  Neuropathy is chronic and stable.  Continues to ambulate with cane with no recent falls  A-fib seem to be both rate and rhythm controlled today and he did not have any complications that are apparent related to use of chronic anticoagulation  Counseled on healthy lifestyle choices, including diet (rich in fruits, vegetables and lean meats and low in salt and simple carbohydrates) and exercise (at least 30 minutes of moderate physical activity daily).  Patient to follow up 6 months  Josefa Syracuse M. Jolinda, DO

## 2024-01-25 ENCOUNTER — Ambulatory Visit: Payer: Self-pay | Admitting: Physician Assistant

## 2024-01-25 LAB — CMP14+EGFR
ALT: 22 IU/L (ref 0–44)
AST: 17 IU/L (ref 0–40)
Albumin: 4.1 g/dL (ref 3.7–4.7)
Alkaline Phosphatase: 92 IU/L (ref 44–121)
BUN/Creatinine Ratio: 15 (ref 10–24)
BUN: 28 mg/dL — ABNORMAL HIGH (ref 8–27)
Bilirubin Total: 0.6 mg/dL (ref 0.0–1.2)
CO2: 24 mmol/L (ref 20–29)
Calcium: 10.1 mg/dL (ref 8.6–10.2)
Chloride: 103 mmol/L (ref 96–106)
Creatinine, Ser: 1.91 mg/dL — ABNORMAL HIGH (ref 0.76–1.27)
Globulin, Total: 2.2 g/dL (ref 1.5–4.5)
Glucose: 157 mg/dL — ABNORMAL HIGH (ref 70–99)
Potassium: 4.7 mmol/L (ref 3.5–5.2)
Sodium: 142 mmol/L (ref 134–144)
Total Protein: 6.3 g/dL (ref 6.0–8.5)
eGFR: 35 mL/min/1.73 — ABNORMAL LOW (ref >59)

## 2024-01-25 LAB — LIPID PANEL
Cholesterol, Total: 104 mg/dL (ref 100–199)
HDL: 31 mg/dL — AB (ref >39)
LDL CALC COMMENT:: 3.4 ratio (ref 0.0–5.0)
LDL Chol Calc (NIH): 53 mg/dL (ref 0–99)
Triglycerides: 103 mg/dL (ref 0–149)
VLDL Cholesterol Cal: 20 mg/dL (ref 5–40)

## 2024-02-14 ENCOUNTER — Telehealth: Payer: Self-pay

## 2024-02-14 ENCOUNTER — Other Ambulatory Visit

## 2024-02-14 DIAGNOSIS — E119 Type 2 diabetes mellitus without complications: Secondary | ICD-10-CM | POA: Diagnosis not present

## 2024-02-14 DIAGNOSIS — R809 Proteinuria, unspecified: Secondary | ICD-10-CM | POA: Diagnosis not present

## 2024-02-14 DIAGNOSIS — D631 Anemia in chronic kidney disease: Secondary | ICD-10-CM | POA: Diagnosis not present

## 2024-02-14 DIAGNOSIS — I10 Essential (primary) hypertension: Secondary | ICD-10-CM | POA: Diagnosis not present

## 2024-02-14 DIAGNOSIS — N189 Chronic kidney disease, unspecified: Secondary | ICD-10-CM | POA: Diagnosis not present

## 2024-02-14 DIAGNOSIS — E211 Secondary hyperparathyroidism, not elsewhere classified: Secondary | ICD-10-CM | POA: Diagnosis not present

## 2024-02-14 NOTE — Telephone Encounter (Signed)
 Copied from CRM [id number]. Topic: Clinical - Request for Lab/Test Order >> Feb 14, 2024  8:23 AM Larissa RAMAN wrote: Reason for CRM: Patient requesting to have labs completed for his kidney specialist. Please contact patient for scheduling.  Callback # [phone]

## 2024-02-16 ENCOUNTER — Other Ambulatory Visit

## 2024-02-16 ENCOUNTER — Other Ambulatory Visit: Payer: Self-pay

## 2024-02-21 DIAGNOSIS — N1832 Chronic kidney disease, stage 3b: Secondary | ICD-10-CM | POA: Diagnosis not present

## 2024-02-21 DIAGNOSIS — E1122 Type 2 diabetes mellitus with diabetic chronic kidney disease: Secondary | ICD-10-CM | POA: Diagnosis not present

## 2024-02-21 DIAGNOSIS — I5032 Chronic diastolic (congestive) heart failure: Secondary | ICD-10-CM | POA: Diagnosis not present

## 2024-02-21 DIAGNOSIS — I129 Hypertensive chronic kidney disease with stage 1 through stage 4 chronic kidney disease, or unspecified chronic kidney disease: Secondary | ICD-10-CM | POA: Diagnosis not present

## 2024-02-28 NOTE — Progress Notes (Signed)
 02/29/2024 Name: Evan Stanley MRN: [id number] DOB: [birth year]  Chief Complaint  Patient presents with   Diabetes    DREUX MCGROARTY is a 83 y.o. year old male who pr/esented for a telephone visit.  I connected with  Thais LITTIE Sharps on 02/29/24 by telephone and verified that I am speaking with the correct person using two identifiers. I discussed the limitations of evaluation and management by telemedicine. The patient expressed understanding and agreed to proceed.  Patient was located in her home and PharmD in PCP office during this visit.   They were referred to the pharmacist by their PCP for assistance in managing diabetes.    Subjective:  Patient living with type 2 diabetes is currently managed by endo.  Pharmacy was consulted for medication assistance, however patient reports his insurance is currently covering his T2DM medications and Eliquis  for no cost.  He appreciates our assistance in the past and remains at goal A1c<7%  Care Team: Primary Care Provider: Jolinda Norene HERO, DO ; Next Scheduled Visit: 03/07/24 Endocrinologist [place] Thapa; Next Scheduled Visit: 03/08/24 Neurologist: Harlene Bogaert; Next Scheduled Visit: 09/19/24  Medication Access/Adherence  Current Pharmacy:  [facility] [phone] - [place], [place] - [address] [place] [address] Phone: [phone] Fax: [phone]  [facility] [address] [place] [address] Phone: [phone] Fax: [phone]   Patient reports affordability concerns with their medications: Yes --[employer] pays for DM/cardiac meds Patient reports access/transportation concerns to their pharmacy: No  Patient reports adherence concerns with their medications:  No    Diabetes:  Current medications:  Trulicity  1.5 mg SQ weekly Novolog  9 units before breakfast and 8 units before supper Tresiba  44 units SQ every morning Medications tried in the past: metformin , glimepiride    Statin: atorvastatin  40 mg daily RAAS: lisinopril  20 mg daily  A1c 6.9% on 11/23/23, decreased from 7.1% on 08/18/23  Current glucose readings:  Using OneTouch Verio meter; testing 3 times daily Respectfully declined CGM  Current physical activity: as able  Current medication access support: formerly enrolled in [employer] PAP (Tresiba , Novolog ), [employer] PAP (Trulicity ), [employer] PPO--CSNP plan   Objective:  Lab Results  Component Value Date   HGBA1C 6.9 (A) 11/23/2023    Lab Results  Component Value Date   CREATININE 1.91 (H) 01/24/2024   BUN 28 (H) 01/24/2024   NA 142 01/24/2024   K 4.7 01/24/2024   CL 103 01/24/2024   CO2 24 01/24/2024    Lab Results  Component Value Date   CHOL 104 01/24/2024   HDL 31 (L) 01/24/2024   LDLCALC 53 01/24/2024   LDLDIRECT 62 02/16/2017   TRIG 103 01/24/2024   CHOLHDL 3.4 01/24/2024    Medications Reviewed Today   Medications were not reviewed in this encounter       Assessment/Plan:   Diabetes: - Currently controlled - Reviewed long term cardiovascular and renal outcomes of uncontrolled blood sugar - Reviewed goal A1c, goal fasting, and goal 2 hour post prandial glucose - Reviewed dietary modifications including FOLLOWING A HEART HEALTHY DIET/HEALTHY PLATE METHOD - Reviewed lifestyle modifications including: increase physical activity as able - Recommend to continue current regimen as prescribed by endo  Offered CGM (no cost $0 copay on HTA), however patient respectfully declined  No hypoglycemia - Patient denies personal or family history of multiple endocrine neoplasia type 2, medullary thyroid  cancer; personal history of pancreatitis or gallbladder disease. - Recommend to check glucose daily (  fasting) or if symptomatic    Follow Up Plan: as needed  Indiyah Paone Dattero Veida Spira, PharmD, BCACP, CPP Clinical Pharmacist, [facility]

## 2024-02-29 ENCOUNTER — Other Ambulatory Visit: Payer: Self-pay | Admitting: Endocrinology

## 2024-02-29 ENCOUNTER — Other Ambulatory Visit (INDEPENDENT_AMBULATORY_CARE_PROVIDER_SITE_OTHER): Admitting: Pharmacist

## 2024-02-29 DIAGNOSIS — Z794 Long term (current) use of insulin: Secondary | ICD-10-CM

## 2024-02-29 DIAGNOSIS — E119 Type 2 diabetes mellitus without complications: Secondary | ICD-10-CM

## 2024-02-29 DIAGNOSIS — Z7985 Long-term (current) use of injectable non-insulin antidiabetic drugs: Secondary | ICD-10-CM

## 2024-03-07 ENCOUNTER — Encounter: Payer: Self-pay | Admitting: Family Medicine

## 2024-03-07 ENCOUNTER — Ambulatory Visit: Admitting: Family Medicine

## 2024-03-07 VITALS — BP 151/67 | HR 57 | Temp 97.8°F | Ht 65.0 in | Wt 176.1 lb

## 2024-03-07 DIAGNOSIS — L570 Actinic keratosis: Secondary | ICD-10-CM | POA: Diagnosis not present

## 2024-03-07 NOTE — Progress Notes (Signed)
 Subjective: RR:dxpw spot PCP: Jolinda Norene HERO, DO YEP:Ontzoo L Parson is a 83 y.o. male presenting to clinic today for:  1. Spot on arm Here for treatment of left arm actinic keratoses.  His right arm was done during his last visit and they healed well.  He reports no complications or concerns   ROS: Per HPI  Allergies  Allergen Reactions   Baclofen  Other (See Comments)    Stroke like symptoms   Morphine  And Codeine Other (See Comments)    Delirium    Past Medical History:  Diagnosis Date   AKI (acute kidney injury) (HCC)    Allergy    Anxiety    Arthritis    Right shoulder   Atrial fibrillation (HCC)    Carotid artery stenosis    Cataract    Cerebrovascular accident (CVA) (HCC) 04/14/2021   CHF (congestive heart failure) (HCC)    CVA (cerebral vascular accident) (HCC) 04/28/2016   Decreased vision    left eye   Diabetes mellitus without complication (HCC)    Takes Metformin    Family history of epilepsy and other diseases of the nervous system 05/13/2019   History of TIA (transient ischemic attack) 05/13/2019   Hyperlipidemia    Hypertension    Salmonella bacteremia 04/29/2016   Sepsis (HCC) 04/29/2016   Stroke (HCC) 11/18/2015   no deficits   TIA (transient ischemic attack)    affected left eye   Urgency of urination     Current Outpatient Medications:    acetaminophen  (TYLENOL ) 325 MG tablet, Take 2 tablets (650 mg total) by mouth every 6 (six) hours as needed for mild pain (or Fever >/= 101)., Disp: , Rfl:    apixaban  (ELIQUIS ) 2.5 MG TABS tablet, Take 1 tablet (2.5 mg total) by mouth 2 (two) times daily., Disp: 60 tablet, Rfl: 11   atorvastatin  (LIPITOR) 40 MG tablet, Take 1 tablet (40 mg total) by mouth daily., Disp: 100 tablet, Rfl: 3   atropine  1 % ophthalmic solution, Place 1 drop into the left eye 2 (two) times daily., Disp: 3 mL, Rfl: 11   Blood Glucose Monitoring Suppl (ONETOUCH VERIO) w/Device KIT, Use 1-4 times daily as needed/directed DX  E11.9, Disp: 1 kit, Rfl: 0   cetirizine (ZYRTEC) 10 MG tablet, Take 1 tablet by mouth as needed for allergies., Disp: , Rfl:    cholecalciferol (VITAMIN D3) 25 MCG (1000 UNIT) tablet, Take 1,000 Units by mouth daily., Disp: , Rfl:    cyanocobalamin  1000 MCG tablet, Take 1,000 mcg by mouth daily., Disp: , Rfl:    Dulaglutide  (TRULICITY ) 1.5 MG/0.5ML SOAJ, Inject 1.5 mg into the skin once a week. Inject the contents of one pen once per week, Disp: 6 mL, Rfl: 5   hydrochlorothiazide  (HYDRODIURIL ) 25 MG tablet, Take 1 tablet (25 mg total) by mouth daily., Disp: 100 tablet, Rfl: 3   insulin  aspart (NOVOLOG  FLEXPEN) 100 UNIT/ML FlexPen, take 9 units before breakfast, and 8 units before supper., Disp: 15 mL, Rfl: 11   insulin  degludec (TRESIBA  FLEXTOUCH) 200 UNIT/ML FlexTouch Pen, Take 44 units qam, Disp: 9 mL, Rfl: 3   Insulin  Pen Needle (BD PEN NEEDLE MICRO ULTRAFINE) 32G X 6 MM MISC, USE WITH FLEX PEN TO ADMINISTER INSULIN , Disp: 300 each, Rfl: 3   Insulin  Pen Needle 32G X 4 MM MISC, Use 4x a day, Disp: 300 each, Rfl: 3   latanoprost  (XALATAN ) 0.005 % ophthalmic solution, Place 1 drop into the left eye at bedtime. , Disp: , Rfl:  lisinopril  (ZESTRIL ) 20 MG tablet, Take 1 tablet (20 mg total) by mouth daily., Disp: 100 tablet, Rfl: 3   memantine  (NAMENDA ) 10 MG tablet, Take 1 tablet (10 mg total) by mouth 2 (two) times daily., Disp: 180 tablet, Rfl: 3   metoprolol  succinate (TOPROL -XL) 100 MG 24 hr tablet, TAKE 1 TABLET BY MOUTH  DAILY WITH OR IMMEDIATELY  FOLLOWING A MEAL, Disp: 100 tablet, Rfl: 3   ONETOUCH VERIO test strip, Use 1-4 times daily as directed/needed   DX E11.9, Disp: 200 each, Rfl: 12   UNABLE TO FIND, Diet: NAS and consistent carbohydrate, Disp: , Rfl:   Current Facility-Administered Medications:    Insulin  Pen Needle (NOVOFINE) 10 each, 1 packet, Subcutaneous, PRN, [place], [place], [place] Social History   Socioeconomic History   Marital status: Married    Spouse name: [name]   Number of  children: 2   Years of education: 11   Highest education level: 11th grade  Occupational History   Occupation: maintenance    Employer: [employer]    Comment: Retired  Tobacco Use   Smoking status: Former    Current packs/day: 0.00    Average packs/day: 2.0 packs/day for 20.0 years (40.0 ttl pk-yrs)    Types: Cigarettes    Start date: 07/20/1966    Quit date: 07/20/1986    Years since quitting: 37.6   Smokeless tobacco: Never  Vaping Use   Vaping status: Never Used  Substance and Sexual Activity   Alcohol use: No    Alcohol/week: 0.0 standard drinks of alcohol   Drug use: No   Sexual activity: Yes  Other Topics Concern   Not on file  Social History Narrative   Lives with wife in home   Right Handed   Drinks 6-7 cups caffeine daily   Social Drivers of Health   Financial Resource Strain: Low Risk  (07/15/2023)   Overall Financial Resource Strain (CARDIA)    Difficulty of Paying Living Expenses: Not hard at all  Food Insecurity: No Food Insecurity (07/15/2023)   Hunger Vital Sign    Worried About Running Out of Food in the Last Year: Never true    Ran Out of Food in the Last Year: Never true  Transportation Needs: No Transportation Needs (07/15/2023)   PRAPARE - Administrator, Civil Service (Medical): No    Lack of Transportation (Non-Medical): No  Physical Activity: Inactive (07/15/2023)   Exercise Vital Sign    Days of Exercise per Week: 0 days    Minutes of Exercise per Session: 0 min  Stress: No Stress Concern Present (07/15/2023)   [employer] of Occupational Health - Occupational Stress Questionnaire    Feeling of Stress : Not at all  Social Connections: Moderately Isolated (07/15/2023)   Social Connection and Isolation Panel    Frequency of Communication with Friends and Family: More than three times a week    Frequency of Social Gatherings with Friends and Family: Three times a week    Attends Religious Services: Never    Active Member of Clubs or  Organizations: No    Attends Banker Meetings: Never    Marital Status: Married  Catering manager Violence: Not At Risk (07/15/2023)   Humiliation, Afraid, Rape, and Kick questionnaire    Fear of Current or Ex-Partner: No    Emotionally Abused: No    Physically Abused: No    Sexually Abused: No   Family History  Problem Relation Age of Onset   Heart disease  Mother    Cancer Mother        breast   Stroke Father 68   Hypertension Father    Early death Sister        infant death   Heart disease Brother    Hydrocephalus Brother    Heart disease Brother    Diabetes Brother    Cancer Brother        prostat   Healthy Son    Healthy Son     Objective: Office vital signs reviewed. BP (!) 151/67   Pulse (!) 57   Temp 97.8 F (36.6 C)   Ht 5' 5 (1.651 m)   Wt 176 lb 2 oz (79.9 kg)   SpO2 98%   BMI 29.31 kg/m   Physical Examination:  Skin: 10 actinic keratoses noted along the left forearm and elbow of various sizes.  There is no evidence of central umbilication, rolled borders, bleeding.  He has multiple areas of hypopigmentation and hyperpigmentation along the forearms as well bilaterally  Cryotherapy Procedure:  Risks and benefits of procedure were reviewed with the patient.  Written consent obtained and scanned into the chart.  Lesion of concern was identified and located on left forearm x10.  Liquid nitrogen was applied to area of concern and extending out 1 millimeters beyond the border of the lesion x10 extending throughout the proximal elbow to the dorsum of the left hand.  Treated area was allowed to come back to room temperature before treating it a second time.  Patient tolerated procedure well and there were no immediate complications.  Home care instructions were reviewed with the patient and a handout was provided.   Assessment/ Plan: 83 y.o. male   Actinic keratoses  Treated with cryoablation x 10 lesions for 2 rounds of cryoablation.  Home care  instructions were reviewed and reasons for reevaluation discussed.   Norene CHRISTELLA Fielding, DO [facility] [phone]

## 2024-03-08 ENCOUNTER — Ambulatory Visit: Payer: Self-pay | Admitting: Endocrinology

## 2024-03-08 ENCOUNTER — Ambulatory Visit (INDEPENDENT_AMBULATORY_CARE_PROVIDER_SITE_OTHER): Admitting: Endocrinology

## 2024-03-08 ENCOUNTER — Encounter: Payer: Self-pay | Admitting: Endocrinology

## 2024-03-08 VITALS — BP 134/60 | HR 79 | Resp 20 | Ht 65.0 in | Wt 174.8 lb

## 2024-03-08 DIAGNOSIS — E1165 Type 2 diabetes mellitus with hyperglycemia: Secondary | ICD-10-CM

## 2024-03-08 DIAGNOSIS — N183 Chronic kidney disease, stage 3 unspecified: Secondary | ICD-10-CM | POA: Diagnosis not present

## 2024-03-08 DIAGNOSIS — E1122 Type 2 diabetes mellitus with diabetic chronic kidney disease: Secondary | ICD-10-CM

## 2024-03-08 DIAGNOSIS — Z794 Long term (current) use of insulin: Secondary | ICD-10-CM

## 2024-03-08 DIAGNOSIS — I129 Hypertensive chronic kidney disease with stage 1 through stage 4 chronic kidney disease, or unspecified chronic kidney disease: Secondary | ICD-10-CM

## 2024-03-08 LAB — POCT GLYCOSYLATED HEMOGLOBIN (HGB A1C): Hemoglobin A1C: 7.3 % — AB (ref 4.0–5.6)

## 2024-03-08 MED ORDER — TRULICITY 1.5 MG/0.5ML ~~LOC~~ SOAJ: 1.5000 mg | SUBCUTANEOUS | 5 refills | Status: AC

## 2024-03-08 MED ORDER — NOVOLOG FLEXPEN 100 UNIT/ML ~~LOC~~ SOPN: PEN_INJECTOR | SUBCUTANEOUS | 11 refills | Status: AC

## 2024-03-08 MED ORDER — TRESIBA FLEXTOUCH 200 UNIT/ML ~~LOC~~ SOPN
40.0000 [IU] | PEN_INJECTOR | SUBCUTANEOUS | 3 refills | Status: DC
Start: 2024-03-08 — End: 2024-06-14

## 2024-03-08 NOTE — Patient Instructions (Signed)
 Decrease Tresiba  40 units daily  in the morning. NovoLog  9 units with breakfast and dinner. Trulicity   1.5 mg weekly.  If glucose is > 130 at bedtime, do not eat snack.  For morning: if glucose < 80, okay not to take novolog  for breakfast.

## 2024-03-08 NOTE — Progress Notes (Signed)
 Lab follow-up for autoantibodies and lab paperwork is abnormal by going to lab please in 1:00  Outpatient Endocrinology Note [place] Micheala Morissette, MD  03/08/24  Patient's Name: Evan Stanley    DOB: 05-02-1941    MRN: [id number]                                                    REASON OF VISIT: Follow up of type 2 diabetes mellitus  PCP: Jolinda Norene HERO, DO  HISTORY OF PRESENT ILLNESS:   Evan Stanley is a 83 y.o. old male with past medical history listed below, is here for follow up for type 2 diabetes mellitus.   Pertinent Diabetes History: Patient was diagnosed with type 2 diabetes mellitus in 2010.  Chronic Diabetes Complications : Retinopathy: ? no. Last ophthalmology exam was done on annually, following with ophthalmology regularly.  Nephropathy: CKD IIIb on ACE/ARB / lisinopril , following with nephrology. Peripheral neuropathy: no Coronary artery disease: yes s/p CABG.  Stroke: yes  Relevant comorbidities and cardiovascular risk factors: Obesity: no Body mass index is 29.09 kg/m.  Hypertension: Yes  Hyperlipidemia : Yes, on statin   Current / Home Diabetic regimen includes: Tresiba  44 units daily  in the morning. NovoLog  9 units with breakfast and dinner. Trulicity   1.5 mg weekly.  Patient gets tresiba  / Novolog  / Trulicity  from patient assistance.   Prior diabetic medications: Glimepiride   and metformin  in the past.   Glycemic data:   He had refused to use CGM in the past.  He has One Touch Verio flex glucometer.  Glucometer data from August 6 - March 08, 2024 average blood sugar 137, he has been checking 2-3 times a day.  Fasting blood sugar mostly acceptable in the range of 110-130, and rarely 95, 82.  The evening blood sugar after 2 hours of eating mostly acceptable 110-150, rarely blood sugar high 220, 260.  No noted hypoglycemia.  Hypoglycemia: Patient has no hypoglycemic episodes. Patient has hypoglycemia awareness.  Factors modifying glucose control: 1.   Diabetic diet assessment: 2- 3 meals.  Usually eats bedtime snack.  2.  Staying active or exercising: Occasional golf playing.  3.  Medication compliance: compliant all of the time.  Interval history  CGM data as reviewed above.  Diabetes regimen is reviewed and noted above.  Hemoglobin A1c 7.3% today.  No complaints today.  He does not take NovoLog  with lunch, usually eats light lunch.  REVIEW OF SYSTEMS As per history of present illness.   PAST MEDICAL HISTORY: Past Medical History:  Diagnosis Date   AKI (acute kidney injury) (HCC)    Allergy    Anxiety    Arthritis    Right shoulder   Atrial fibrillation (HCC)    Carotid artery stenosis    Cataract    Cerebrovascular accident (CVA) (HCC) 04/14/2021   CHF (congestive heart failure) (HCC)    CVA (cerebral vascular accident) (HCC) 04/28/2016   Decreased vision    left eye   Diabetes mellitus without complication (HCC)    Takes Metformin    Family history of epilepsy and other diseases of the nervous system 05/13/2019   History of TIA (transient ischemic attack) 05/13/2019   Hyperlipidemia    Hypertension    Salmonella bacteremia 04/29/2016   Sepsis (HCC) 04/29/2016   Stroke (HCC) 11/18/2015   no deficits  TIA (transient ischemic attack)    affected left eye   Urgency of urination     PAST SURGICAL HISTORY: Past Surgical History:  Procedure Laterality Date   AORTIC VALVE REPLACEMENT     BACK SURGERY  80s   CARDIAC VALVE REPLACEMENT  11/01/2008   21-mm Edwards Pericardial Magna - Ease valve   ENDARTERECTOMY Right 01/13/2016   Procedure: ENDARTERECTOMY CAROTID - RIGHT;  Surgeon: Redell LITTIE Door, MD;  Location: [facility] OR;  Service: Vascular;  Laterality: Right;   EYE SURGERY     laser surgery, left eye  Left 10-29-2015    retinal surgery/ Arley Ruder MD    PERIPHERAL VASCULAR CATHETERIZATION Right 11/18/2015   Procedure: Carotid Angiography;  Surgeon: Redell LITTIE Door, MD;  Location: [facility] INVASIVE CV LAB;  Service: Cardiovascular;   Laterality: Right;   PERIPHERAL VASCULAR CATHETERIZATION N/A 11/18/2015   Procedure: Aortic Arch Angiography;  Surgeon: Redell LITTIE Door, MD;  Location: [facility] INVASIVE CV LAB;  Service: Cardiovascular;  Laterality: N/A;   TEE WITHOUT CARDIOVERSION N/A 05/05/2016   Procedure: TRANSESOPHAGEAL ECHOCARDIOGRAM (TEE);  Surgeon: Redell GORMAN Shallow, MD;  Location: [facility] ENDOSCOPY;  Service: Cardiovascular;  Laterality: N/A;    ALLERGIES: Allergies  Allergen Reactions   Baclofen  Other (See Comments)    Stroke like symptoms   Morphine  And Codeine Other (See Comments)    Delirium     FAMILY HISTORY:  Family History  Problem Relation Age of Onset   Heart disease Mother    Cancer Mother        breast   Stroke Father 58   Hypertension Father    Early death Sister        infant death   Heart disease Brother    Hydrocephalus Brother    Heart disease Brother    Diabetes Brother    Cancer Brother        prostat   Healthy Son    Healthy Son     SOCIAL HISTORY: Social History   Socioeconomic History   Marital status: Married    Spouse name: [name]   Number of children: 2   Years of education: 11   Highest education level: 11th grade  Occupational History   Occupation: maintenance    Employer: [employer]    Comment: Retired  Tobacco Use   Smoking status: Former    Current packs/day: 0.00    Average packs/day: 2.0 packs/day for 20.0 years (40.0 ttl pk-yrs)    Types: Cigarettes    Start date: 07/20/1966    Quit date: 07/20/1986    Years since quitting: 37.6   Smokeless tobacco: Never  Vaping Use   Vaping status: Never Used  Substance and Sexual Activity   Alcohol use: No    Alcohol/week: 0.0 standard drinks of alcohol   Drug use: No   Sexual activity: Yes  Other Topics Concern   Not on file  Social History Narrative   Lives with wife in home   Right Handed   Drinks 6-7 cups caffeine daily   Social Drivers of Health   Financial Resource Strain: Low Risk  (07/15/2023)   Overall Financial  Resource Strain (CARDIA)    Difficulty of Paying Living Expenses: Not hard at all  Food Insecurity: No Food Insecurity (07/15/2023)   Hunger Vital Sign    Worried About Running Out of Food in the Last Year: Never true    Ran Out of Food in the Last Year: Never true  Transportation Needs: No Transportation Needs (07/15/2023)  PRAPARE - Administrator, Civil Service (Medical): No    Lack of Transportation (Non-Medical): No  Physical Activity: Inactive (07/15/2023)   Exercise Vital Sign    Days of Exercise per Week: 0 days    Minutes of Exercise per Session: 0 min  Stress: No Stress Concern Present (07/15/2023)   [employer] of Occupational Health - Occupational Stress Questionnaire    Feeling of Stress : Not at all  Social Connections: Moderately Isolated (07/15/2023)   Social Connection and Isolation Panel    Frequency of Communication with Friends and Family: More than three times a week    Frequency of Social Gatherings with Friends and Family: Three times a week    Attends Religious Services: Never    Active Member of Clubs or Organizations: No    Attends Banker Meetings: Never    Marital Status: Married    MEDICATIONS:  Current Outpatient Medications  Medication Sig Dispense Refill   acetaminophen  (TYLENOL ) 325 MG tablet Take 2 tablets (650 mg total) by mouth every 6 (six) hours as needed for mild pain (or Fever >/= 101).     apixaban  (ELIQUIS ) 2.5 MG TABS tablet Take 1 tablet (2.5 mg total) by mouth 2 (two) times daily. 60 tablet 11   atorvastatin  (LIPITOR) 40 MG tablet Take 1 tablet (40 mg total) by mouth daily. 100 tablet 3   atropine  1 % ophthalmic solution Place 1 drop into the left eye 2 (two) times daily. 3 mL 11   Blood Glucose Monitoring Suppl (ONETOUCH VERIO) w/Device KIT Use 1-4 times daily as needed/directed DX E11.9 1 kit 0   cetirizine (ZYRTEC) 10 MG tablet Take 1 tablet by mouth as needed for allergies.     cholecalciferol (VITAMIN  D3) 25 MCG (1000 UNIT) tablet Take 1,000 Units by mouth daily.     cyanocobalamin  1000 MCG tablet Take 1,000 mcg by mouth daily.     hydrochlorothiazide  (HYDRODIURIL ) 25 MG tablet Take 1 tablet (25 mg total) by mouth daily. 100 tablet 3   Insulin  Pen Needle (BD PEN NEEDLE MICRO ULTRAFINE) 32G X 6 MM MISC USE WITH FLEX PEN TO ADMINISTER INSULIN  300 each 3   Insulin  Pen Needle 32G X 4 MM MISC Use 4x a day 300 each 3   latanoprost  (XALATAN ) 0.005 % ophthalmic solution Place 1 drop into the left eye at bedtime.      lisinopril  (ZESTRIL ) 20 MG tablet Take 1 tablet (20 mg total) by mouth daily. 100 tablet 3   memantine  (NAMENDA ) 10 MG tablet Take 1 tablet (10 mg total) by mouth 2 (two) times daily. 180 tablet 3   metoprolol  succinate (TOPROL -XL) 100 MG 24 hr tablet TAKE 1 TABLET BY MOUTH  DAILY WITH OR IMMEDIATELY  FOLLOWING A MEAL 100 tablet 3   ONETOUCH VERIO test strip Use 1-4 times daily as directed/needed   DX E11.9 200 each 12   UNABLE TO FIND Diet: NAS and consistent carbohydrate     Dulaglutide  (TRULICITY ) 1.5 MG/0.5ML SOAJ Inject 1.5 mg into the skin once a week. Inject the contents of one pen once per week 6 mL 5   insulin  aspart (NOVOLOG  FLEXPEN) 100 UNIT/ML FlexPen take 9 units before breakfast, and 9 units before supper. 15 mL 11   insulin  degludec (TRESIBA  FLEXTOUCH) 200 UNIT/ML FlexTouch Pen Inject 40 Units into the skin daily. Take 44 units qam 9 mL 3   Current Facility-Administered Medications  Medication Dose Route Frequency Provider Last Rate Last Admin  Insulin  Pen Needle (NOVOFINE) 10 each  1 packet Subcutaneous PRN Keyerra Lamere, [place], MD        PHYSICAL EXAM: Vitals:   03/08/24 1109  BP: 134/60  Pulse: 79  Resp: 20  SpO2: 96%  Weight: 174 lb 12.8 oz (79.3 kg)  Height: 5' 5 (1.651 m)     Body mass index is 29.09 kg/m.  Wt Readings from Last 3 Encounters:  03/08/24 174 lb 12.8 oz (79.3 kg)  03/07/24 176 lb 2 oz (79.9 kg)  01/24/24 173 lb (78.5 kg)    General: Well  developed, well nourished male in no apparent distress.  HEENT: AT/[place], no external lesions.  Eyes: Conjunctiva clear and no icterus. Neck: Neck supple  Lungs: Respirations not labored Neurologic: Alert, oriented, normal speech Extremities / Skin: Dry.  Psychiatric: Does not appear depressed or anxious  Diabetic Foot Exam - Simple   No data filed    LABS Reviewed Lab Results  Component Value Date   HGBA1C 7.3 (A) 03/08/2024   HGBA1C 6.9 (A) 11/23/2023   HGBA1C 7.1 (A) 08/18/2023   Lab Results  Component Value Date   FRUCTOSAMINE 274 08/12/2020   FRUCTOSAMINE 323 (H) 04/22/2018   Lab Results  Component Value Date   CHOL 104 01/24/2024   HDL 31 (L) 01/24/2024   LDLCALC 53 01/24/2024   LDLDIRECT 62 02/16/2017   TRIG 103 01/24/2024   CHOLHDL 3.4 01/24/2024   Lab Results  Component Value Date   MICRALBCREAT 47.2 (H) 11/25/2017   MICRALBCREAT 81.6 (H) 10/07/2016   Lab Results  Component Value Date   CREATININE 1.91 (H) 01/24/2024   Lab Results  Component Value Date   GFR 30.80 (L) 05/20/2023    ASSESSMENT / PLAN  1. Type 2 DM with CKD stage 3 and hypertension (HCC)   2. Uncontrolled type 2 diabetes mellitus with hyperglycemia, with long-term current use of insulin  (HCC)     Diabetes Mellitus type 2, complicated by diabetic nephropathy/CAD. - Diabetic status / severity: Fair control.  Lab Results  Component Value Date   HGBA1C 7.3 (A) 03/08/2024    - Hemoglobin A1c goal : <7%  Glucose data acceptable as reviewed in HPI.  Patient reports he is bedtime snack almost every day due to fear of hypoglycemia overnight.  - Medications: See below.    I) decrease Tresiba  from 44 to 40 units daily. II) continue NovoLog  9 units with breakfast and dinner. III) continue Trulicity   1.5 mg weekly.  If glucose is > 130 at bedtime, do not eat snack.  For morning: if glucose < 80, okay not to take novolog  for breakfast.   Patient assistance for Tresiba  and NovoLog   for Novo Nordisk and Trulicity  from [place].    - Home glucose testing: Before meals and bedtime. - Discussed/ Gave Hypoglycemia treatment plan.  # Consult : not required at this time.   # Annual urine for microalbuminuria/ creatinine ratio, + microalbuminuria currently, continue ACE/ARB /lisinopril , .  Patient has been following with nephrology for CKD 3B.  Patient reports urine microalbumin was checked recently at nephrology visit. Last  Lab Results  Component Value Date   MICRALBCREAT 47.2 (H) 11/25/2017    # Foot check nightly.  # Annual dilated diabetic eye exams.   - Diet: Make healthy diabetic food choices - Life style / activity / exercise: Discussed.  2. Blood pressure  -  BP Readings from Last 1 Encounters:  03/08/24 134/60    - Control is in target.  -  No change in current plans.   3. Lipid status / Hyperlipidemia - Last  Lab Results  Component Value Date   LDLCALC 53 01/24/2024   - Continue atorvastatin  40 mg daily.  Managed by PCP.   Diagnoses and all orders for this visit:  Type 2 DM with CKD stage 3 and hypertension (HCC) -     POCT glycosylated hemoglobin (Hb A1C)  Uncontrolled type 2 diabetes mellitus with hyperglycemia, with long-term current use of insulin  (HCC) -     insulin  degludec (TRESIBA  FLEXTOUCH) 200 UNIT/ML FlexTouch Pen; Inject 40 Units into the skin daily. Take 44 units qam -     Dulaglutide  (TRULICITY ) 1.5 MG/0.5ML SOAJ; Inject 1.5 mg into the skin once a week. Inject the contents of one pen once per week -     insulin  aspart (NOVOLOG  FLEXPEN) 100 UNIT/ML FlexPen; take 9 units before breakfast, and 9 units before supper.    DISPOSITION Follow up in clinic in 3 months suggested.   All questions answered and patient verbalized understanding of the plan.  [place] Annely Sliva, MD [facility] Endocrinology [facility] [address] [place], [address] [place], [place] [address] Phone # [phone]  At least part of this note was generated  using voice recognition software. Inadvertent word errors may have occurred, which were not recognized during the proofreading process.

## 2024-03-29 ENCOUNTER — Other Ambulatory Visit: Payer: Self-pay

## 2024-03-29 ENCOUNTER — Telehealth: Payer: Self-pay | Admitting: Endocrinology

## 2024-03-29 DIAGNOSIS — E1122 Type 2 diabetes mellitus with diabetic chronic kidney disease: Secondary | ICD-10-CM

## 2024-03-29 MED ORDER — ONETOUCH VERIO VI STRP: ORAL_STRIP | 12 refills | Status: AC

## 2024-03-29 NOTE — Telephone Encounter (Signed)
 MEDICATION: ONETOUCH VERIO test strip   PHARMACY:    [id number] - [place], [address] - [address] (Ph: [phone])    HAS THE PATIENT CONTACTED THEIR PHARMACY?  Yes  LAST REFILL:  @@LASTREFILL @  IS THIS A 90 DAY SUPPLY : Yes  IS PATIENT OUT OF MEDICATION: No  IF NOT; HOW MUCH IS LEFT: 2 weeks  LAST APPOINTMENT DATE: @8 /20/2025  NEXT APPOINTMENT DATE:@11 /26/2025  DO WE HAVE YOUR PERMISSION TO LEAVE A DETAILED MESSAGE?: Yes  OTHER COMMENTS:    **Let patient know to contact pharmacy at the end of the day to make sure medication is ready. **  ** Please notify patient to allow 48-72 hours to process**  **Encourage patient to contact the pharmacy for refills or they can request refills through [facility]**

## 2024-04-03 DIAGNOSIS — H4089 Other specified glaucoma: Secondary | ICD-10-CM | POA: Diagnosis not present

## 2024-04-03 DIAGNOSIS — H1812 Bullous keratopathy, left eye: Secondary | ICD-10-CM | POA: Diagnosis not present

## 2024-04-03 DIAGNOSIS — H2511 Age-related nuclear cataract, right eye: Secondary | ICD-10-CM | POA: Diagnosis not present

## 2024-04-03 DIAGNOSIS — Z961 Presence of intraocular lens: Secondary | ICD-10-CM | POA: Diagnosis not present

## 2024-04-04 DIAGNOSIS — L84 Corns and callosities: Secondary | ICD-10-CM | POA: Diagnosis not present

## 2024-04-04 DIAGNOSIS — M79675 Pain in left toe(s): Secondary | ICD-10-CM | POA: Diagnosis not present

## 2024-04-04 DIAGNOSIS — E1142 Type 2 diabetes mellitus with diabetic polyneuropathy: Secondary | ICD-10-CM | POA: Diagnosis not present

## 2024-04-04 DIAGNOSIS — M79674 Pain in right toe(s): Secondary | ICD-10-CM | POA: Diagnosis not present

## 2024-04-04 DIAGNOSIS — B351 Tinea unguium: Secondary | ICD-10-CM | POA: Diagnosis not present

## 2024-05-23 ENCOUNTER — Other Ambulatory Visit

## 2024-05-23 DIAGNOSIS — D631 Anemia in chronic kidney disease: Secondary | ICD-10-CM | POA: Diagnosis not present

## 2024-05-23 DIAGNOSIS — N189 Chronic kidney disease, unspecified: Secondary | ICD-10-CM | POA: Diagnosis not present

## 2024-05-23 DIAGNOSIS — R809 Proteinuria, unspecified: Secondary | ICD-10-CM | POA: Diagnosis not present

## 2024-05-31 DIAGNOSIS — I129 Hypertensive chronic kidney disease with stage 1 through stage 4 chronic kidney disease, or unspecified chronic kidney disease: Secondary | ICD-10-CM | POA: Diagnosis not present

## 2024-05-31 DIAGNOSIS — R809 Proteinuria, unspecified: Secondary | ICD-10-CM | POA: Diagnosis not present

## 2024-05-31 DIAGNOSIS — N1832 Chronic kidney disease, stage 3b: Secondary | ICD-10-CM | POA: Diagnosis not present

## 2024-05-31 DIAGNOSIS — E1122 Type 2 diabetes mellitus with diabetic chronic kidney disease: Secondary | ICD-10-CM | POA: Diagnosis not present

## 2024-06-14 ENCOUNTER — Other Ambulatory Visit: Payer: Self-pay | Admitting: Endocrinology

## 2024-06-14 ENCOUNTER — Encounter: Payer: Self-pay | Admitting: Endocrinology

## 2024-06-14 ENCOUNTER — Ambulatory Visit (INDEPENDENT_AMBULATORY_CARE_PROVIDER_SITE_OTHER): Admitting: Endocrinology

## 2024-06-14 ENCOUNTER — Ambulatory Visit: Payer: Self-pay | Admitting: Endocrinology

## 2024-06-14 VITALS — BP 128/62 | HR 69 | Resp 16 | Ht 65.0 in | Wt 176.4 lb

## 2024-06-14 DIAGNOSIS — E1122 Type 2 diabetes mellitus with diabetic chronic kidney disease: Secondary | ICD-10-CM

## 2024-06-14 DIAGNOSIS — Z794 Long term (current) use of insulin: Secondary | ICD-10-CM | POA: Diagnosis not present

## 2024-06-14 DIAGNOSIS — E1165 Type 2 diabetes mellitus with hyperglycemia: Secondary | ICD-10-CM

## 2024-06-14 DIAGNOSIS — I129 Hypertensive chronic kidney disease with stage 1 through stage 4 chronic kidney disease, or unspecified chronic kidney disease: Secondary | ICD-10-CM

## 2024-06-14 DIAGNOSIS — N183 Chronic kidney disease, stage 3 unspecified: Secondary | ICD-10-CM

## 2024-06-14 LAB — POCT GLYCOSYLATED HEMOGLOBIN (HGB A1C): Hemoglobin A1C: 7.1 % — AB (ref 4.0–5.6)

## 2024-06-14 MED ORDER — TRESIBA FLEXTOUCH 200 UNIT/ML ~~LOC~~ SOPN: 36.0000 [IU] | PEN_INJECTOR | SUBCUTANEOUS | 3 refills | Status: DC

## 2024-06-14 NOTE — Patient Instructions (Signed)
 Decrease Tresiba  36 units daily  in the morning. NovoLog  9 units with breakfast and dinner. Trulicity   1.5 mg weekly.  If glucose is > 130 at bedtime, do not eat snack.  For morning: if glucose < 80, okay not to take novolog  for breakfast.

## 2024-06-14 NOTE — Progress Notes (Signed)
 Lab follow-up for autoantibodies and lab paperwork is abnormal by going to lab please in 1:00  Outpatient Endocrinology Note Evan Kosta, MD  06/14/24  Patient's Name: Evan Stanley    DOB: 1940-09-30    MRN: [id number]                                                    REASON OF VISIT: Follow up of type 2 diabetes mellitus  PCP: Evan Norene HERO, DO  HISTORY OF PRESENT ILLNESS:   Evan Stanley is a 83 y.o. old male with past medical history listed below, is here for follow up for type 2 diabetes mellitus.   Pertinent Diabetes History: Patient was diagnosed with type 2 diabetes mellitus in 2010.  Chronic Diabetes Complications : Retinopathy: no. Last ophthalmology exam was done on annually 09/2023, following with ophthalmology regularly.  Nephropathy: CKD IIIb on ACE/ARB / lisinopril , following with nephrology. Peripheral neuropathy: no Coronary artery disease: yes s/p CABG.  Stroke: yes  Relevant comorbidities and cardiovascular risk factors: Obesity: no Body mass index is 29.35 kg/m.  Hypertension: Yes  Hyperlipidemia : Yes, on statin   Current / Home Diabetic regimen includes: Tresiba  40 units daily  in the morning. NovoLog  9 units with breakfast and dinner.  Light lunch does not take NovoLog . Trulicity   1.5 mg weekly.  Patient used to get tresiba  / Novolog  / Trulicity  from patient assistance.   Prior diabetic medications: Glimepiride   and metformin  in the past.   Glycemic data:   He had refused to use CGM in the past as of November 2025.  He has [employer].  Glucometer data from November 12 to June 14, 2024 average blood sugar 136, he has been checking 2 times a day.  Fasting blood sugar mostly acceptable in the range of 105-130, and rarely 98, 93, 87, 85.  Blood sugar at bedtime mostly 1 50-1 70s range occasionally 186, 190, 199, 220.  No noted hypoglycemia.  Hypoglycemia: Patient has no hypoglycemic episodes. Patient has hypoglycemia  awareness.  Factors modifying glucose control: 1.  Diabetic diet assessment: 2- 3 meals.  Usually eats bedtime snack.  2.  Staying active or exercising: Occasional golf playing.  3.  Medication compliance: compliant all of the time.  Interval history  Glucometer data as reviewed above.  Hemoglobin A1c 7.1%.  Diabetes regimen as reviewed and noted above.  No complaints today.  He is accompanied by wife in the clinic today.  REVIEW OF SYSTEMS As per history of present illness.   PAST MEDICAL HISTORY: Past Medical History:  Diagnosis Date   AKI (acute kidney injury)    Allergy    Anxiety    Arthritis    Right shoulder   Atrial fibrillation (HCC)    Carotid artery stenosis    Cataract    Cerebrovascular accident (CVA) (HCC) 04/14/2021   CHF (congestive heart failure) (HCC)    CVA (cerebral vascular accident) (HCC) 04/28/2016   Decreased vision    left eye   Diabetes mellitus without complication (HCC)    Takes Metformin    Family history of epilepsy and other diseases of the nervous system 05/13/2019   History of TIA (transient ischemic attack) 05/13/2019   Hyperlipidemia    Hypertension    Salmonella bacteremia 04/29/2016   Sepsis (HCC) 04/29/2016   Stroke (HCC)  11/18/2015   no deficits   TIA (transient ischemic attack)    affected left eye   Urgency of urination     PAST SURGICAL HISTORY: Past Surgical History:  Procedure Laterality Date   AORTIC VALVE REPLACEMENT     BACK SURGERY  80s   CARDIAC VALVE REPLACEMENT  11/01/2008   21-mm Edwards Pericardial Magna - Ease valve   ENDARTERECTOMY Right 01/13/2016   Procedure: ENDARTERECTOMY CAROTID - RIGHT;  Surgeon: Evan LITTIE Door, MD;  Location: [facility] OR;  Service: Vascular;  Laterality: Right;   EYE SURGERY     laser surgery, left eye  Left 10-29-2015    retinal surgery/ Evan Ruder MD    PERIPHERAL VASCULAR CATHETERIZATION Right 11/18/2015   Procedure: Carotid Angiography;  Surgeon: Evan LITTIE Door, MD;  Location: [facility] INVASIVE  CV LAB;  Service: Cardiovascular;  Laterality: Right;   PERIPHERAL VASCULAR CATHETERIZATION N/A 11/18/2015   Procedure: Aortic Arch Angiography;  Surgeon: Evan LITTIE Door, MD;  Location: [facility] INVASIVE CV LAB;  Service: Cardiovascular;  Laterality: N/A;   TEE WITHOUT CARDIOVERSION N/A 05/05/2016   Procedure: TRANSESOPHAGEAL ECHOCARDIOGRAM (TEE);  Surgeon: Evan GORMAN Shallow, MD;  Location: [facility] ENDOSCOPY;  Service: Cardiovascular;  Laterality: N/A;    ALLERGIES: Allergies  Allergen Reactions   Baclofen  Other (See Comments)    Stroke like symptoms   Morphine  And Codeine Other (See Comments)    Delirium     FAMILY HISTORY:  Family History  Problem Relation Age of Onset   Heart disease Mother    Cancer Mother        breast   Stroke Father 51   Hypertension Father    Early death Sister        infant death   Heart disease Brother    Hydrocephalus Brother    Heart disease Brother    Diabetes Brother    Cancer Brother        prostat   Healthy Son    Healthy Son     SOCIAL HISTORY: Social History   Socioeconomic History   Marital status: Married    Spouse name: [name]   Number of children: 2   Years of education: 11   Highest education level: 11th grade  Occupational History   Occupation: maintenance    Employer: [employer]    Comment: Retired  Tobacco Use   Smoking status: Former    Current packs/day: 0.00    Average packs/day: 2.0 packs/day for 20.0 years (40.0 ttl pk-yrs)    Types: Cigarettes    Start date: 07/20/1966    Quit date: 07/20/1986    Years since quitting: 37.9   Smokeless tobacco: Never  Vaping Use   Vaping status: Never Used  Substance and Sexual Activity   Alcohol use: No    Alcohol/week: 0.0 standard drinks of alcohol   Drug use: No   Sexual activity: Yes  Other Topics Concern   Not on file  Social History Narrative   Lives with wife in home   Right Handed   Drinks 6-7 cups caffeine daily   Social Drivers of Health   Financial Resource Strain: Low Risk   (07/15/2023)   Overall Financial Resource Strain (CARDIA)    Difficulty of Paying Living Expenses: Not hard at all  Food Insecurity: No Food Insecurity (07/15/2023)   Hunger Vital Sign    Worried About Running Out of Food in the Last Year: Never true    Ran Out of Food in the Last Year: Never true  Transportation Needs: No Transportation Needs (07/15/2023)   PRAPARE - Administrator, Civil Service (Medical): No    Lack of Transportation (Non-Medical): No  Physical Activity: Inactive (07/15/2023)   Exercise Vital Sign    Days of Exercise per Week: 0 days    Minutes of Exercise per Session: 0 min  Stress: No Stress Concern Present (07/15/2023)   [employer] of Occupational Health - Occupational Stress Questionnaire    Feeling of Stress : Not at all  Social Connections: Moderately Isolated (07/15/2023)   Social Connection and Isolation Panel    Frequency of Communication with Friends and Family: More than three times a week    Frequency of Social Gatherings with Friends and Family: Three times a week    Attends Religious Services: Never    Active Member of Clubs or Organizations: No    Attends Banker Meetings: Never    Marital Status: Married    MEDICATIONS:  Current Outpatient Medications  Medication Sig Dispense Refill   acetaminophen  (TYLENOL ) 325 MG tablet Take 2 tablets (650 mg total) by mouth every 6 (six) hours as needed for mild pain (or Fever >/= 101).     apixaban  (ELIQUIS ) 2.5 MG TABS tablet Take 1 tablet (2.5 mg total) by mouth 2 (two) times daily. 60 tablet 11   atorvastatin  (LIPITOR) 40 MG tablet Take 1 tablet (40 mg total) by mouth daily. 100 tablet 3   atropine  1 % ophthalmic solution Place 1 drop into the left eye 2 (two) times daily. 3 mL 11   Blood Glucose Monitoring Suppl (ONETOUCH VERIO) w/Device KIT Use 1-4 times daily as needed/directed DX E11.9 1 kit 0   cetirizine (ZYRTEC) 10 MG tablet Take 1 tablet by mouth as needed for  allergies.     cholecalciferol (VITAMIN D3) 25 MCG (1000 UNIT) tablet Take 1,000 Units by mouth daily.     cyanocobalamin  1000 MCG tablet Take 1,000 mcg by mouth daily.     Dulaglutide  (TRULICITY ) 1.5 MG/0.5ML SOAJ Inject 1.5 mg into the skin once a week. Inject the contents of one pen once per week 6 mL 5   hydrochlorothiazide  (HYDRODIURIL ) 25 MG tablet Take 1 tablet (25 mg total) by mouth daily. 100 tablet 3   insulin  aspart (NOVOLOG  FLEXPEN) 100 UNIT/ML FlexPen take 9 units before breakfast, and 9 units before supper. 15 mL 11   Insulin  Pen Needle (BD PEN NEEDLE MICRO ULTRAFINE) 32G X 6 MM MISC USE WITH FLEX PEN TO ADMINISTER INSULIN  300 each 3   Insulin  Pen Needle 32G X 4 MM MISC Use 4x a day 300 each 3   latanoprost  (XALATAN ) 0.005 % ophthalmic solution Place 1 drop into the left eye at bedtime.      lisinopril  (ZESTRIL ) 20 MG tablet Take 1 tablet (20 mg total) by mouth daily. 100 tablet 3   memantine  (NAMENDA ) 10 MG tablet Take 1 tablet (10 mg total) by mouth 2 (two) times daily. 180 tablet 3   metoprolol  succinate (TOPROL -XL) 100 MG 24 hr tablet TAKE 1 TABLET BY MOUTH  DAILY WITH OR IMMEDIATELY  FOLLOWING A MEAL 100 tablet 3   ONETOUCH VERIO test strip Use 1-4 times daily as directed/needed   DX E11.9 200 each 12   UNABLE TO FIND Diet: NAS and consistent carbohydrate     insulin  degludec (TRESIBA  FLEXTOUCH) 200 UNIT/ML FlexTouch Pen Inject 36 Units into the skin daily. Take 44 units qam 9 mL 3   Current Facility-Administered Medications  Medication  Dose Route Frequency Provider Last Rate Last Admin   Insulin  Pen Needle (NOVOFINE) 10 each  1 packet Subcutaneous PRN Arnulfo Batson, MD        PHYSICAL EXAM: Vitals:   06/14/24 1057  BP: 128/62  Pulse: 69  Resp: 16  SpO2: 97%  Weight: 176 lb 6.4 oz (80 kg)  Height: 5' 5 (1.651 m)     Body mass index is 29.35 kg/m.  Wt Readings from Last 3 Encounters:  06/14/24 176 lb 6.4 oz (80 kg)  03/08/24 174 lb 12.8 oz (79.3 kg)  03/07/24  176 lb 2 oz (79.9 kg)    General: Well developed, well nourished male in no apparent distress.  HEENT: AT/[place], no external lesions.  Eyes: Conjunctiva clear and no icterus. Neck: Neck supple  Lungs: Respirations not labored Neurologic: Alert, oriented, normal speech Extremities / Skin: Dry.  Psychiatric: Does not appear depressed or anxious  Diabetic Foot Exam - Simple   No data filed    LABS Reviewed Lab Results  Component Value Date   HGBA1C 7.1 (A) 06/14/2024   HGBA1C 7.3 (A) 03/08/2024   HGBA1C 6.9 (A) 11/23/2023   Lab Results  Component Value Date   FRUCTOSAMINE 274 08/12/2020   FRUCTOSAMINE 323 (H) 04/22/2018   Lab Results  Component Value Date   CHOL 104 01/24/2024   HDL 31 (L) 01/24/2024   LDLCALC 53 01/24/2024   LDLDIRECT 62 02/16/2017   TRIG 103 01/24/2024   CHOLHDL 3.4 01/24/2024   Lab Results  Component Value Date   MICRALBCREAT 47.2 (H) 11/25/2017   MICRALBCREAT 81.6 (H) 10/07/2016   Lab Results  Component Value Date   CREATININE 1.91 (H) 01/24/2024   Lab Results  Component Value Date   GFR 30.80 (L) 05/20/2023    ASSESSMENT / PLAN  1. Uncontrolled type 2 diabetes mellitus with hyperglycemia, with long-term current use of insulin  (HCC)   2. Type 2 DM with CKD stage 3 and hypertension (HCC)     Diabetes Mellitus type 2, complicated by diabetic nephropathy/CAD. - Diabetic status / severity: Fair control.  Lab Results  Component Value Date   HGBA1C 7.1 (A) 06/14/2024    - Hemoglobin A1c goal : <7%  Glucose data acceptable as reviewed in HPI.  Occasional low normal blood sugar.  Will decrease basal insulin  as follows.   - Medications: See below.    I) decrease Tresiba  from 40 to 36 units daily. II) continue NovoLog  9 units with breakfast and dinner. III) continue Trulicity   1.5 mg weekly.  If glucose is > 130 at bedtime, do not eat snack.  For morning: if glucose < 80, okay not to take novolog  for breakfast.   Patient  assistance for Tresiba  and NovoLog  for Novo Nordisk and Trulicity  from [employer].  He reports now he is getting from pharmacy.  - Home glucose testing: Before meals and bedtime.  He declined CGM. - Discussed/ Gave Hypoglycemia treatment plan.  # Consult : not required at this time.   # Annual urine for microalbuminuria/ creatinine ratio, + microalbuminuria currently, continue ACE/ARB /lisinopril , .  Patient has been following with nephrology for CKD 3B.  Patient reports urine microalbumin was checked recently at nephrology visit. Last  Lab Results  Component Value Date   MICRALBCREAT 47.2 (H) 11/25/2017    # Foot check nightly.  # Annual dilated diabetic eye exams.   - Diet: Make healthy diabetic food choices - Life style / activity / exercise: Discussed.  2. Blood pressure  -  BP Readings from Last 1 Encounters:  06/14/24 128/62    - Control is in target.  - No change in current plans.   3. Lipid status / Hyperlipidemia - Last  Lab Results  Component Value Date   LDLCALC 53 01/24/2024   - Continue atorvastatin  40 mg daily.  Managed by PCP.   Diagnoses and all orders for this visit:  Uncontrolled type 2 diabetes mellitus with hyperglycemia, with long-term current use of insulin  (HCC) -     insulin  degludec (TRESIBA  FLEXTOUCH) 200 UNIT/ML FlexTouch Pen; Inject 36 Units into the skin daily. Take 44 units qam  Type 2 DM with CKD stage 3 and hypertension (HCC) -     POCT glycosylated hemoglobin (Hb A1C)    DISPOSITION Follow up in clinic in 3 months suggested.   All questions answered and patient verbalized understanding of the plan.  Ginia Rudell, MD [name] Endocrinology [facility] [address] [place], [address] [place], [place] [address] Phone # [phone]  At least part of this note was generated using voice recognition software. Inadvertent word errors may have occurred, which were not recognized during the proofreading process.

## 2024-07-17 ENCOUNTER — Ambulatory Visit (INDEPENDENT_AMBULATORY_CARE_PROVIDER_SITE_OTHER): Payer: Medicare Other

## 2024-07-17 VITALS — BP 137/71 | HR 79 | Temp 98.2°F | Ht 65.0 in | Wt 175.4 lb

## 2024-07-17 DIAGNOSIS — Z23 Encounter for immunization: Secondary | ICD-10-CM

## 2024-07-17 DIAGNOSIS — Z0000 Encounter for general adult medical examination without abnormal findings: Secondary | ICD-10-CM | POA: Diagnosis not present

## 2024-07-17 NOTE — Patient Instructions (Signed)
 Mr. Nolden,  Thank you for taking the time for your Medicare Wellness Visit. I appreciate your continued commitment to your health goals. Please review the care plan we discussed, and feel free to reach out if I can assist you further.  Please note that Annual Wellness Visits do not include a physical exam. Some assessments may be limited, especially if the visit was conducted virtually. If needed, we may recommend an in-person follow-up with your provider.  Ongoing Care Seeing your primary care provider every 3 to 6 months helps us  monitor your health and provide consistent, personalized care.   Referrals If a referral was made during today's visit and you haven't received any updates within two weeks, please contact the referred provider directly to check on the status.  Recommended Screenings:  Health Maintenance  Topic Date Due   Yearly kidney health urinalysis for diabetes  11/26/2018   Zoster (Shingles) Vaccine (2 of 2) 03/10/2023   COVID-19 Vaccine (4 - 2025-26 season) 03/20/2024   Medicare Annual Wellness Visit  07/14/2024   Flu Shot  10/17/2024*   Complete foot exam   11/22/2024   Eye exam for diabetics  11/30/2024   Hemoglobin A1C  12/12/2024   Yearly kidney function blood test for diabetes  01/23/2025   DTaP/Tdap/Td vaccine (3 - Td or Tdap) 07/08/2032   Pneumococcal Vaccine for age over 15  Completed   Meningitis B Vaccine  Aged Out   Colon Cancer Screening  Discontinued   Hepatitis C Screening  Discontinued  *Topic was postponed. The date shown is not the original due date.       07/17/2024    9:58 AM  Advanced Directives  Does Patient Have a Medical Advance Directive? No  Would patient like information on creating a medical advance directive? No - Patient declined    Vision: Annual vision screenings are recommended for early detection of glaucoma, cataracts, and diabetic retinopathy. These exams can also reveal signs of chronic conditions such as diabetes and high  blood pressure.  Dental: Annual dental screenings help detect early signs of oral cancer, gum disease, and other conditions linked to overall health, including heart disease and diabetes.  Please see the attached documents for additional preventive care recommendations.

## 2024-07-17 NOTE — Progress Notes (Signed)
 "  Chief Complaint  Patient presents with   Medicare Wellness     Subjective:   Evan Stanley is a 83 y.o. male who presents for a Medicare Annual Wellness Visit.  Visit info / Clinical Intake: Medicare Wellness Visit Type:: Subsequent Annual Wellness Visit Persons participating in visit and providing information:: patient Medicare Wellness Visit Mode:: In-person (required for WTM) Interpreter Needed?: No Pre-visit prep was completed: yes AWV questionnaire completed by patient prior to visit?: no Living arrangements:: lives with spouse/significant other Patient's Overall Health Status Rating: very good Typical amount of pain: none Does pain affect daily life?: no Are you currently prescribed opioids?: no  Dietary Habits and Nutritional Risks How many meals a day?: 2 Eats fruit and vegetables daily?: yes Most meals are obtained by: preparing own meals In the last 2 weeks, have you had any of the following?: none Diabetic:: (!) yes Any non-healing wounds?: no How often do you check your BS?: 2 Would you like to be referred to a Nutritionist or for Diabetic Management? : no  Functional Status Ambulation: Independent with device- listed below Home Assistive Devices/Equipment: Cane Medication Administration: Independent Home Management (perform basic housework or laundry): Independent Manage your own finances?: -- (wife helps) Primary transportation is: family / friends (wife/however per pt he can drive himself to the doctors office) Concerns about vision?: (!) yes (Pt is blind on his L-eye /wear glasses/last ov 3mos ago w/Evan Stanley in [place]) Concerns about hearing?: (!) yes Uses hearing aids?: no Hear whispered voice?: (!) no *in-person visit only*  Fall Screening Falls in the past year?: 0 Number of falls in past year: 0 Was there an injury with Fall?: 0 Fall Risk Category Calculator: 0 Patient Fall Risk Level: Low Fall Risk  Fall Risk Patient at Risk for  Falls Due to: No Fall Risks Fall risk Follow up: Falls evaluation completed; Education provided  Home and Transportation Safety: All rugs have non-skid backing?: yes All stairs or steps have railings?: yes Grab bars in the bathtub or shower?: yes Have non-skid surface in bathtub or shower?: yes Good home lighting?: yes Regular seat belt use?: yes Hospital stays in the last year:: no  Cognitive Assessment Difficulty concentrating, remembering, or making decisions? : no Will 6CIT or Mini Cog be Completed: yes What month is it?: 0 points Give patient an address phrase to remember (5 components): [address] [name] About what time is it?: 0 points Count backwards from 20 to 1: 0 points Say the months of the year in reverse: 4 points Repeat the address phrase from earlier: 0 points  Advance Directives (For Healthcare) Does Patient Have a Medical Advance Directive?: No Would patient like information on creating a medical advance directive?: No - Patient declined  Reviewed/Updated  Reviewed/Updated: Reviewed All (Medical, Surgical, Family, Medications, Allergies, Care Teams, Patient Goals); Surgical History; Medical History; Family History; Medications; Allergies; Care Teams    Allergies (verified) Baclofen  and Morphine  and codeine   Current Medications (verified) Outpatient Encounter Medications as of 07/17/2024  Medication Sig   acetaminophen  (TYLENOL ) 325 MG tablet Take 2 tablets (650 mg total) by mouth every 6 (six) hours as needed for mild pain (or Fever >/= 101).   apixaban  (ELIQUIS ) 2.5 MG TABS tablet Take 1 tablet (2.5 mg total) by mouth 2 (two) times daily.   atorvastatin  (LIPITOR) 40 MG tablet Take 1 tablet (40 mg total) by mouth daily.   atropine  1 % ophthalmic solution Place 1 drop into the left eye  2 (two) times daily.   Blood Glucose Monitoring Suppl (ONETOUCH VERIO) w/Device KIT Use 1-4 times daily as needed/directed DX E11.9   cetirizine (ZYRTEC) 10 MG  tablet Take 1 tablet by mouth as needed for allergies.   cholecalciferol (VITAMIN D3) 25 MCG (1000 UNIT) tablet Take 1,000 Units by mouth daily.   cyanocobalamin  1000 MCG tablet Take 1,000 mcg by mouth daily.   Dulaglutide  (TRULICITY ) 1.5 MG/0.5ML SOAJ Inject 1.5 mg into the skin once a week. Inject the contents of one pen once per week   hydrochlorothiazide  (HYDRODIURIL ) 25 MG tablet Take 1 tablet (25 mg total) by mouth daily.   insulin  aspart (NOVOLOG  FLEXPEN) 100 UNIT/ML FlexPen take 9 units before breakfast, and 9 units before supper.   Insulin  Degludec FlexTouch 200 UNIT/ML SOPN Inject 36 Units into the skin daily.   Insulin  Pen Needle (BD PEN NEEDLE MICRO ULTRAFINE) 32G X 6 MM MISC USE WITH FLEX PEN TO ADMINISTER INSULIN    Insulin  Pen Needle 32G X 4 MM MISC Use 4x a day   latanoprost  (XALATAN ) 0.005 % ophthalmic solution Place 1 drop into the left eye at bedtime.    lisinopril  (ZESTRIL ) 20 MG tablet Take 1 tablet (20 mg total) by mouth daily.   memantine  (NAMENDA ) 10 MG tablet Take 1 tablet (10 mg total) by mouth 2 (two) times daily.   metoprolol  succinate (TOPROL -XL) 100 MG 24 hr tablet TAKE 1 TABLET BY MOUTH  DAILY WITH OR IMMEDIATELY  FOLLOWING A MEAL   ONETOUCH VERIO test strip Use 1-4 times daily as directed/needed   DX E11.9   UNABLE TO FIND Diet: NAS and consistent carbohydrate   Facility-Administered Encounter Medications as of 07/17/2024  Medication   Insulin  Pen Needle (NOVOFINE) 10 each    History: Past Medical History:  Diagnosis Date   AKI (acute kidney injury)    Allergy    Anxiety    Arthritis    Right shoulder   Atrial fibrillation (HCC)    Carotid artery stenosis    Cataract    Cerebrovascular accident (CVA) (HCC) 04/14/2021   CHF (congestive heart failure) (HCC)    CVA (cerebral vascular accident) (HCC) 04/28/2016   Decreased vision    left eye   Diabetes mellitus without complication (HCC)    Takes Metformin    Family history of epilepsy and other  diseases of the nervous system 05/13/2019   History of TIA (transient ischemic attack) 05/13/2019   Hyperlipidemia    Hypertension    Salmonella bacteremia 04/29/2016   Sepsis (HCC) 04/29/2016   Stroke (HCC) 11/18/2015   no deficits   TIA (transient ischemic attack)    affected left eye   Urgency of urination    Past Surgical History:  Procedure Laterality Date   AORTIC VALVE REPLACEMENT     BACK SURGERY  80s   CARDIAC VALVE REPLACEMENT  11/01/2008   21-mm Edwards Pericardial Magna - Ease valve   ENDARTERECTOMY Right 01/13/2016   Procedure: ENDARTERECTOMY CAROTID - RIGHT;  Surgeon: Redell LITTIE Door, MD;  Location: [facility];  Service: Vascular;  Laterality: Right;   EYE SURGERY     laser surgery, left eye  Left 10-29-2015    retinal surgery/ Arley Ruder MD    PERIPHERAL VASCULAR CATHETERIZATION Right 11/18/2015   Procedure: Carotid Angiography;  Surgeon: Redell LITTIE Door, MD;  Location: [facility] INVASIVE CV LAB;  Service: Cardiovascular;  Laterality: Right;   PERIPHERAL VASCULAR CATHETERIZATION N/A 11/18/2015   Procedure: Aortic Arch Angiography;  Surgeon: Redell LITTIE Door, MD;  Location: [facility] INVASIVE CV LAB;  Service: Cardiovascular;  Laterality: N/A;   TEE WITHOUT CARDIOVERSION N/A 05/05/2016   Procedure: TRANSESOPHAGEAL ECHOCARDIOGRAM (TEE);  Surgeon: Redell GORMAN Shallow, MD;  Location: [facility] ENDOSCOPY;  Service: Cardiovascular;  Laterality: N/A;   Family History  Problem Relation Age of Onset   Heart disease Mother    Cancer Mother        breast   Stroke Father 27   Hypertension Father    Early death Sister        infant death   Heart disease Brother    Hydrocephalus Brother    Heart disease Brother    Diabetes Brother    Cancer Brother        prostat   Healthy Son    Healthy Son    Social History   Occupational History   Occupation: maintenance    Employer: [employer]    Comment: Retired  Tobacco Use   Smoking status: Former    Current packs/day: 0.00    Average packs/day: 2.0 packs/day for  20.0 years (40.0 ttl pk-yrs)    Types: Cigarettes    Start date: 07/20/1966    Quit date: 07/20/1986    Years since quitting: 38.0   Smokeless tobacco: Never  Vaping Use   Vaping status: Never Used  Substance and Sexual Activity   Alcohol use: No    Alcohol/week: 0.0 standard drinks of alcohol   Drug use: No   Sexual activity: Yes   Tobacco Counseling Counseling given: Yes  SDOH Screenings   Food Insecurity: No Food Insecurity (07/17/2024)  Housing: Unknown (07/17/2024)  Transportation Needs: No Transportation Needs (07/17/2024)  Utilities: Not At Risk (07/17/2024)  Alcohol Screen: Low Risk (07/15/2023)  Depression (PHQ2-9): Low Risk (07/17/2024)  Financial Resource Strain: Low Risk (07/15/2023)  Physical Activity: Inactive (07/17/2024)  Social Connections: Moderately Isolated (07/17/2024)  Stress: No Stress Concern Present (07/17/2024)  Tobacco Use: Medium Risk (07/17/2024)  Health Literacy: Adequate Health Literacy (07/17/2024)   See flowsheets for full screening details  Depression Screen PHQ 2 & 9 Depression Scale- Over the past 2 weeks, how often have you been bothered by any of the following problems? Little interest or pleasure in doing things: 0 Feeling down, depressed, or hopeless (PHQ Adolescent also includes...irritable): 0 PHQ-2 Total Score: 0 Trouble falling or staying asleep, or sleeping too much: 0 Feeling tired or having little energy: 0 Poor appetite or overeating (PHQ Adolescent also includes...weight loss): 0 Feeling bad about yourself - or that you are a failure or have let yourself or your family down: 0 Trouble concentrating on things, such as reading the newspaper or watching television (PHQ Adolescent also includes...like school work): 0 Moving or speaking so slowly that other people could have noticed. Or the opposite - being so fidgety or restless that you have been moving around a lot more than usual: 0 Thoughts that you would be better off dead, or  of hurting yourself in some way: 0 PHQ-9 Total Score: 0 If you checked off any problems, how difficult have these problems made it for you to do your work, take care of things at home, or get along with other people?: Not difficult at all     Goals Addressed             This Visit's Progress    Stay Active and Independent               Objective:    Today's Vitals   07/17/24 9046  BP: 137/71  Pulse: 79  Temp: 98.2 F (36.8 C)  TempSrc: Oral  Weight: 175 lb 6.4 oz (79.6 kg)  Height: 5' 5 (1.651 m)   Body mass index is 29.19 kg/m.  Hearing/Vision screen No results found. Immunizations and Health Maintenance Health Maintenance  Topic Date Due   Diabetic kidney evaluation - Urine ACR  11/26/2018   Zoster Vaccines- Shingrix  (2 of 2) 03/10/2023   COVID-19 Vaccine (4 - 2025-26 season) 03/20/2024   Influenza Vaccine  10/17/2024 (Originally 02/18/2024)   FOOT EXAM  11/22/2024   OPHTHALMOLOGY EXAM  11/30/2024   HEMOGLOBIN A1C  12/12/2024   Diabetic kidney evaluation - eGFR measurement  01/23/2025   Medicare Annual Wellness (AWV)  07/17/2025   DTaP/Tdap/Td (3 - Td or Tdap) 07/08/2032   Pneumococcal Vaccine: 50+ Years  Completed   Meningococcal B Vaccine  Aged Out   Colonoscopy  Discontinued   Hepatitis C Screening  Discontinued        Assessment/Plan:  This is a routine wellness examination for R[place]pey.  Patient Care Team: Jolinda Norene HERO, DO as PCP - General (Family Medicine) Okey Vina GAILS, MD as PCP - Cardiology (Cardiology) Stanley Arley LABOR, MD as Consulting Physician (Ophthalmology) Octavia Charleston, MD as Consulting Physician (Ophthalmology) Onita Duos, MD as Consulting Physician (Neurology) [facility] Roddie Bring, DPM as Consulting Physician (Podiatry) Billee, Mliss BIRCH, RPH-CPP (Pharmacist) Thapa, Sudan, MD as Consulting Physician (Endocrinology) Rachele Gaynell RAMAN, MD as Consulting Physician (Nephrology)  I have personally reviewed  and noted the following in the patients chart:   Medical and social history Use of alcohol, tobacco or illicit drugs  Current medications and supplements including opioid prescriptions. Functional ability and status Nutritional status Physical activity Advanced directives List of other physicians Hospitalizations, surgeries, and ER visits in previous 12 months Vitals Screenings to include cognitive, depression, and falls Referrals and appointments  No orders of the defined types were placed in this encounter.  In addition, I have reviewed and discussed with patient certain preventive protocols, quality metrics, and best practice recommendations. A written personalized care plan for preventive services as well as general preventive health recommendations were provided to patient.   Evan Stanley, CMA   07/17/2024   Return in 1 year (on 07/17/2025).  After Visit Summary: via my chart/mail it to pt  Nurse Notes: pcp f/u: pt is due UrineACR "

## 2024-07-28 ENCOUNTER — Ambulatory Visit: Payer: Self-pay | Admitting: Family Medicine

## 2024-07-28 ENCOUNTER — Encounter: Payer: Self-pay | Admitting: Family Medicine

## 2024-07-28 VITALS — BP 124/68 | HR 65 | Temp 97.5°F | Ht 65.0 in | Wt 176.2 lb

## 2024-07-28 DIAGNOSIS — E1169 Type 2 diabetes mellitus with other specified complication: Secondary | ICD-10-CM

## 2024-07-28 DIAGNOSIS — I480 Paroxysmal atrial fibrillation: Secondary | ICD-10-CM | POA: Diagnosis not present

## 2024-07-28 DIAGNOSIS — I152 Hypertension secondary to endocrine disorders: Secondary | ICD-10-CM | POA: Diagnosis not present

## 2024-07-28 DIAGNOSIS — Z794 Long term (current) use of insulin: Secondary | ICD-10-CM

## 2024-07-28 DIAGNOSIS — E1159 Type 2 diabetes mellitus with other circulatory complications: Secondary | ICD-10-CM | POA: Diagnosis not present

## 2024-07-28 DIAGNOSIS — E785 Hyperlipidemia, unspecified: Secondary | ICD-10-CM

## 2024-07-28 DIAGNOSIS — E119 Type 2 diabetes mellitus without complications: Secondary | ICD-10-CM

## 2024-07-28 LAB — BASIC METABOLIC PANEL WITH GFR
BUN/Creatinine Ratio: 14 (ref 10–24)
BUN: 27 mg/dL (ref 8–27)
CO2: 27 mmol/L (ref 20–29)
Calcium: 9.2 mg/dL (ref 8.6–10.2)
Chloride: 103 mmol/L (ref 96–106)
Creatinine, Ser: 1.99 mg/dL — ABNORMAL HIGH (ref 0.76–1.27)
Glucose: 134 mg/dL — ABNORMAL HIGH (ref 70–99)
Potassium: 4.1 mmol/L (ref 3.5–5.2)
Sodium: 143 mmol/L (ref 134–144)
eGFR: 33 mL/min/1.73 — ABNORMAL LOW

## 2024-07-28 LAB — BAYER DCA HB A1C WAIVED: HB A1C (BAYER DCA - WAIVED): 6.9 % — ABNORMAL HIGH (ref 4.8–5.6)

## 2024-07-28 NOTE — Progress Notes (Signed)
 "  Subjective: CC: Interval checkup PCP: Evan Norene HERO, DO YEP:Evan Stanley is a 84 y.o. male presenting to clinic today for:  Patient here with his wife.  He reports he has done well.  No concerns over the holidays.  No low blood sugars.  Has an appoint with endocrinology next month.  He notes that he did not start the Farxiga as directed by nephrology.  His wife had concerns about multiple side effects including urinary tract infection, which he has been hospitalized for on several occasions.  He continues to use his insulin  and GLP as directed.  Next office visit with nephrology will be in March.  He is compliant with all blood pressure medications and cholesterol medicines.  No reports of falls.  Affording his Eliquis  without any difficulty as his insurance has done a great job covering all medications  ROS: Per HPI  Allergies[1] Past Medical History:  Diagnosis Date   AKI (acute kidney injury)    Allergy    Anxiety    Arthritis    Right shoulder   Atrial fibrillation (HCC)    Carotid artery stenosis    Cataract    Cerebrovascular accident (CVA) (HCC) 04/14/2021   CHF (congestive heart failure) (HCC)    CVA (cerebral vascular accident) (HCC) 04/28/2016   Decreased vision    left eye   Diabetes mellitus without complication (HCC)    Takes Metformin    Family history of epilepsy and other diseases of the nervous system 05/13/2019   History of TIA (transient ischemic attack) 05/13/2019   Hyperlipidemia    Hypertension    Salmonella bacteremia 04/29/2016   Sepsis (HCC) 04/29/2016   Stroke (HCC) 11/18/2015   no deficits   TIA (transient ischemic attack)    affected left eye   Urgency of urination    Current Medications[2] Social History   Socioeconomic History   Marital status: Married    Spouse name: Evan Stanley   Number of children: 2   Years of education: 11   Highest education level: 11th grade  Occupational History   Occupation: maintenance    Employer:  [employer]    Comment: Retired  Tobacco Use   Smoking status: Former    Current packs/day: 0.00    Average packs/day: 2.0 packs/day for 20.0 years (40.0 ttl pk-yrs)    Types: Cigarettes    Start date: 07/20/1966    Quit date: 07/20/1986    Years since quitting: 38.0   Smokeless tobacco: Never  Vaping Use   Vaping status: Never Used  Substance and Sexual Activity   Alcohol use: No    Alcohol/week: 0.0 standard drinks of alcohol   Drug use: No   Sexual activity: Yes  Other Topics Concern   Not on file  Social History Narrative   Lives with wife in home   Right Handed   Drinks 6-7 cups caffeine daily   Social Drivers of Health   Tobacco Use: Medium Risk (07/17/2024)   Patient History    Smoking Tobacco Use: Former    Smokeless Tobacco Use: Never    Passive Exposure: Not on Actuary Strain: Low Risk (07/15/2023)   Overall Financial Resource Strain (CARDIA)    Difficulty of Paying Living Expenses: Not hard at all  Food Insecurity: No Food Insecurity (07/17/2024)   Epic    Worried About Radiation Protection Practitioner of Food in the Last Year: Never true    Ran Out of Food in the Last Year: Never true  Transportation Needs:  No Transportation Needs (07/17/2024)   Epic    Lack of Transportation (Medical): No    Lack of Transportation (Non-Medical): No  Physical Activity: Inactive (07/17/2024)   Exercise Vital Sign    Days of Exercise per Week: 7 days    Minutes of Exercise per Session: 0 min  Stress: No Stress Concern Present (07/17/2024)   [employer] of Occupational Health - Occupational Stress Questionnaire    Feeling of Stress: Not at all  Social Connections: Moderately Isolated (07/17/2024)   Social Connection and Isolation Panel    Frequency of Communication with Friends and Family: More than three times a week    Frequency of Social Gatherings with Friends and Family: Three times a week    Attends Religious Services: Never    Active Member of Clubs or Organizations: No     Attends Banker Meetings: Never    Marital Status: Married  Catering Manager Violence: Not At Risk (07/17/2024)   Epic    Fear of Current or Ex-Partner: No    Emotionally Abused: No    Physically Abused: No    Sexually Abused: No  Depression (PHQ2-9): Low Risk (07/17/2024)   Depression (PHQ2-9)    PHQ-2 Score: 0  Alcohol Screen: Low Risk (07/15/2023)   Alcohol Screen    Last Alcohol Screening Score (AUDIT): 0  Housing: Unknown (07/17/2024)   Epic    Unable to Pay for Housing in the Last Year: No    Number of Times Moved in the Last Year: Not on file    Homeless in the Last Year: No  Utilities: Not At Risk (07/17/2024)   Epic    Threatened with loss of utilities: No  Health Literacy: Adequate Health Literacy (07/17/2024)   B1300 Health Literacy    Frequency of need for help with medical instructions: Never   Family History  Problem Relation Age of Onset   Heart disease Mother    Cancer Mother        breast   Stroke Father 4   Hypertension Father    Early death Sister        infant death   Heart disease Brother    Hydrocephalus Brother    Heart disease Brother    Diabetes Brother    Cancer Brother        prostat   Healthy Son    Healthy Son     Objective: Office vital signs reviewed. BP 124/68   Pulse 65   Temp (!) 97.5 F (36.4 C)   Ht 5' 5 (1.651 m)   Wt 176 lb 4 oz (79.9 kg)   SpO2 98%   BMI 29.33 kg/m   Physical Examination:  General: Awake, alert, well nourished, No acute distress HEENT: Ptosis of the right side.  Some drooling Cardio: Irregularly irregular with rate controlled.  S1S2 heard, no murmurs appreciated Pulm: clear to auscultation bilaterally, no wheezes, rhonchi or rales; normal work of breathing on room air Extremities: warm, well perfused, No edema, cyanosis or clubbing; +2 pulses bilaterally MSK: Ambulating with minimal assistance Neuro: Somewhat hard of hearing but interacts with provider appropriately.  Has ptosis  as above.  Assessment/ Plan: 84 y.o. male   Insulin -requiring or dependent type II diabetes mellitus (HCC) - Plan: Microalbumin / creatinine urine ratio, Bayer DCA Hb A1c Waived, Basic Metabolic Panel  Hypertension associated with diabetes (HCC) - Plan: Basic Metabolic Panel  Hyperlipidemia associated with type 2 diabetes mellitus (HCC) - Plan: Basic Metabolic Panel  Paroxysmal  A-fib (HCC)   Check urine microalbumin, A1c, BMP.  Keep appointment with endocrinology.  Will CC results to appropriate specialists.  Blood pressure is controlled and no changes are needed.  Agree that given recurrent UTI that led to hospitalization, Doreen likely risky in this patient.  However, we did discuss the benefits that Farxiga could provide including improvement in renal function  A-fib is rate controlled.  Not having any issues with cost for Eliquis .  Encouraged to reach out to me if running into the issues going forward  Follow-up in 6 months for annual physical with fasting labs  Clarabelle Oscarson CHRISTELLA Fielding, DO [facility] [phone]     [1]  Allergies Allergen Reactions   Baclofen  Other (See Comments)    Stroke like symptoms   Morphine  And Codeine Other (See Comments)    Delirium   [2]  Current Outpatient Medications:    acetaminophen  (TYLENOL ) 325 MG tablet, Take 2 tablets (650 mg total) by mouth every 6 (six) hours as needed for mild pain (or Fever >/= 101)., Disp: , Rfl:    apixaban  (ELIQUIS ) 2.5 MG TABS tablet, Take 1 tablet (2.5 mg total) by mouth 2 (two) times daily., Disp: 60 tablet, Rfl: 11   atorvastatin  (LIPITOR) 40 MG tablet, Take 1 tablet (40 mg total) by mouth daily., Disp: 100 tablet, Rfl: 3   atropine  1 % ophthalmic solution, Place 1 drop into the left eye 2 (two) times daily., Disp: 3 mL, Rfl: 11   Blood Glucose Monitoring Suppl (ONETOUCH VERIO) w/Device KIT, Use 1-4 times daily as needed/directed DX E11.9, Disp: 1 kit, Rfl: 0   cetirizine (ZYRTEC) 10 MG  tablet, Take 1 tablet by mouth as needed for allergies., Disp: , Rfl:    cholecalciferol (VITAMIN D3) 25 MCG (1000 UNIT) tablet, Take 1,000 Units by mouth daily., Disp: , Rfl:    cyanocobalamin  1000 MCG tablet, Take 1,000 mcg by mouth daily., Disp: , Rfl:    Dulaglutide  (TRULICITY ) 1.5 MG/0.5ML SOAJ, Inject 1.5 mg into the skin once a week. Inject the contents of one pen once per week, Disp: 6 mL, Rfl: 5   hydrochlorothiazide  (HYDRODIURIL ) 25 MG tablet, Take 1 tablet (25 mg total) by mouth daily., Disp: 100 tablet, Rfl: 3   insulin  aspart (NOVOLOG  FLEXPEN) 100 UNIT/ML FlexPen, take 9 units before breakfast, and 9 units before supper., Disp: 15 mL, Rfl: 11   Insulin  Degludec FlexTouch 200 UNIT/ML SOPN, Inject 36 Units into the skin daily., Disp: 9 mL, Rfl: 3   Insulin  Pen Needle (BD PEN NEEDLE MICRO ULTRAFINE) 32G X 6 MM MISC, USE WITH FLEX PEN TO ADMINISTER INSULIN , Disp: 300 each, Rfl: 3   Insulin  Pen Needle 32G X 4 MM MISC, Use 4x a day, Disp: 300 each, Rfl: 3   latanoprost  (XALATAN ) 0.005 % ophthalmic solution, Place 1 drop into the left eye at bedtime. , Disp: , Rfl:    lisinopril  (ZESTRIL ) 20 MG tablet, Take 1 tablet (20 mg total) by mouth daily., Disp: 100 tablet, Rfl: 3   memantine  (NAMENDA ) 10 MG tablet, Take 1 tablet (10 mg total) by mouth 2 (two) times daily., Disp: 180 tablet, Rfl: 3   metoprolol  succinate (TOPROL -XL) 100 MG 24 hr tablet, TAKE 1 TABLET BY MOUTH  DAILY WITH OR IMMEDIATELY  FOLLOWING A MEAL, Disp: 100 tablet, Rfl: 3   ONETOUCH VERIO test strip, Use 1-4 times daily as directed/needed   DX E11.9, Disp: 200 each, Rfl: 12   UNABLE TO FIND, Diet: NAS  and consistent carbohydrate, Disp: , Rfl:   Current Facility-Administered Medications:    Insulin  Pen Needle (NOVOFINE) 10 each, 1 packet, Subcutaneous, PRN, Thapa, [place], MD  "

## 2024-07-29 LAB — MICROALBUMIN / CREATININE URINE RATIO
Creatinine, Urine: 185.5 mg/dL
Microalb/Creat Ratio: 208 mg/g{creat} — ABNORMAL HIGH (ref 0–29)
Microalbumin, Urine: 386.2 ug/mL

## 2024-07-31 ENCOUNTER — Ambulatory Visit: Payer: Self-pay | Admitting: Family Medicine

## 2024-09-14 ENCOUNTER — Ambulatory Visit: Admitting: Endocrinology

## 2024-10-16 ENCOUNTER — Ambulatory Visit: Admitting: Adult Health

## 2025-01-24 ENCOUNTER — Encounter: Payer: Self-pay | Admitting: Family Medicine
